# Patient Record
Sex: Female | Born: 1940
Health system: Southern US, Community
[De-identification: ages and names within clinical notes are randomized; demographics above are authoritative.]

## PROBLEM LIST (undated history)

## (undated) DIAGNOSIS — T8859XA Other complications of anesthesia, initial encounter: Secondary | ICD-10-CM

## (undated) DIAGNOSIS — L03115 Cellulitis of right lower limb: Secondary | ICD-10-CM

## (undated) DIAGNOSIS — T4145XA Adverse effect of unspecified anesthetic, initial encounter: Secondary | ICD-10-CM

## (undated) DIAGNOSIS — Z85820 Personal history of malignant melanoma of skin: Secondary | ICD-10-CM

## (undated) DIAGNOSIS — R0609 Other forms of dyspnea: Secondary | ICD-10-CM

## (undated) DIAGNOSIS — K219 Gastro-esophageal reflux disease without esophagitis: Secondary | ICD-10-CM

## (undated) DIAGNOSIS — Z8679 Personal history of other diseases of the circulatory system: Secondary | ICD-10-CM

## (undated) DIAGNOSIS — G4733 Obstructive sleep apnea (adult) (pediatric): Secondary | ICD-10-CM

## (undated) DIAGNOSIS — D071 Carcinoma in situ of vulva: Secondary | ICD-10-CM

## (undated) DIAGNOSIS — Z973 Presence of spectacles and contact lenses: Secondary | ICD-10-CM

## (undated) DIAGNOSIS — Z859 Personal history of malignant neoplasm, unspecified: Secondary | ICD-10-CM

## (undated) DIAGNOSIS — I509 Heart failure, unspecified: Secondary | ICD-10-CM

## (undated) DIAGNOSIS — I10 Essential (primary) hypertension: Secondary | ICD-10-CM

## (undated) DIAGNOSIS — I2510 Atherosclerotic heart disease of native coronary artery without angina pectoris: Secondary | ICD-10-CM

## (undated) DIAGNOSIS — F419 Anxiety disorder, unspecified: Secondary | ICD-10-CM

## (undated) DIAGNOSIS — M1990 Unspecified osteoarthritis, unspecified site: Secondary | ICD-10-CM

## (undated) DIAGNOSIS — R0600 Dyspnea, unspecified: Secondary | ICD-10-CM

## (undated) DIAGNOSIS — J45909 Unspecified asthma, uncomplicated: Secondary | ICD-10-CM

## (undated) DIAGNOSIS — J449 Chronic obstructive pulmonary disease, unspecified: Secondary | ICD-10-CM

## (undated) DIAGNOSIS — Z9889 Other specified postprocedural states: Secondary | ICD-10-CM

## (undated) DIAGNOSIS — D75839 Thrombocytosis, unspecified: Secondary | ICD-10-CM

## (undated) DIAGNOSIS — R109 Unspecified abdominal pain: Secondary | ICD-10-CM

## (undated) DIAGNOSIS — D473 Essential (hemorrhagic) thrombocythemia: Secondary | ICD-10-CM

## (undated) DIAGNOSIS — R600 Localized edema: Secondary | ICD-10-CM

## (undated) DIAGNOSIS — Z972 Presence of dental prosthetic device (complete) (partial): Secondary | ICD-10-CM

## (undated) HISTORY — PX: ABDOMINAL HYSTERECTOMY: SHX81

## (undated) HISTORY — DX: Gastro-esophageal reflux disease without esophagitis: K21.9

## (undated) HISTORY — DX: Atherosclerotic heart disease of native coronary artery without angina pectoris: I25.10

## (undated) HISTORY — PX: CARDIAC CATHETERIZATION: SHX172

## (undated) HISTORY — DX: Chronic obstructive pulmonary disease, unspecified: J44.9

## (undated) HISTORY — DX: Unspecified osteoarthritis, unspecified site: M19.90

## (undated) HISTORY — DX: Anxiety disorder, unspecified: F41.9

## (undated) HISTORY — DX: Unspecified asthma, uncomplicated: J45.909

## (undated) HISTORY — DX: Unspecified abdominal pain: R10.9

## (undated) HISTORY — DX: Heart failure, unspecified: I50.9

## (undated) HISTORY — DX: Cellulitis of right lower limb: L03.115

## (undated) HISTORY — DX: Essential (primary) hypertension: I10

---

## 2001-04-24 ENCOUNTER — Other Ambulatory Visit: Admission: RE | Admit: 2001-04-24 | Discharge: 2001-04-24 | Payer: Self-pay | Admitting: Obstetrics and Gynecology

## 2002-08-19 HISTORY — PX: KNEE ARTHROSCOPY: SUR90

## 2002-08-19 HISTORY — PX: REPAIR PERONEAL TENDONS ANKLE: SUR1201

## 2003-01-25 ENCOUNTER — Encounter: Admission: RE | Admit: 2003-01-25 | Discharge: 2003-01-25 | Payer: Self-pay | Admitting: Unknown Physician Specialty

## 2003-01-25 ENCOUNTER — Encounter: Payer: Self-pay | Admitting: Unknown Physician Specialty

## 2004-06-20 ENCOUNTER — Ambulatory Visit: Payer: Self-pay | Admitting: Oncology

## 2004-09-12 ENCOUNTER — Ambulatory Visit: Payer: Self-pay | Admitting: Oncology

## 2004-12-05 ENCOUNTER — Ambulatory Visit: Payer: Self-pay | Admitting: Oncology

## 2005-03-13 ENCOUNTER — Ambulatory Visit: Payer: Self-pay | Admitting: Oncology

## 2005-06-05 ENCOUNTER — Ambulatory Visit: Payer: Self-pay | Admitting: Oncology

## 2005-09-04 ENCOUNTER — Ambulatory Visit: Payer: Self-pay | Admitting: Oncology

## 2005-11-28 ENCOUNTER — Ambulatory Visit: Payer: Self-pay | Admitting: Oncology

## 2006-02-20 ENCOUNTER — Ambulatory Visit: Payer: Self-pay | Admitting: Oncology

## 2006-05-15 ENCOUNTER — Ambulatory Visit: Payer: Self-pay | Admitting: Oncology

## 2006-08-07 ENCOUNTER — Ambulatory Visit: Payer: Self-pay | Admitting: Oncology

## 2006-08-19 HISTORY — PX: SUBDURAL HEMATOMA EVACUATION VIA CRANIOTOMY: SUR319

## 2006-12-04 ENCOUNTER — Ambulatory Visit: Payer: Self-pay | Admitting: Oncology

## 2007-03-02 ENCOUNTER — Ambulatory Visit: Payer: Self-pay | Admitting: Oncology

## 2007-04-27 ENCOUNTER — Ambulatory Visit: Payer: Self-pay | Admitting: Oncology

## 2013-08-19 HISTORY — PX: MELANOMA EXCISION: SHX5266

## 2014-06-21 DIAGNOSIS — J309 Allergic rhinitis, unspecified: Secondary | ICD-10-CM

## 2014-06-21 DIAGNOSIS — K219 Gastro-esophageal reflux disease without esophagitis: Secondary | ICD-10-CM | POA: Insufficient documentation

## 2014-06-21 DIAGNOSIS — J449 Chronic obstructive pulmonary disease, unspecified: Secondary | ICD-10-CM | POA: Insufficient documentation

## 2014-06-21 DIAGNOSIS — I10 Essential (primary) hypertension: Secondary | ICD-10-CM

## 2014-06-21 HISTORY — DX: Essential (primary) hypertension: I10

## 2014-06-21 HISTORY — DX: Allergic rhinitis, unspecified: J30.9

## 2014-06-21 HISTORY — DX: Gastro-esophageal reflux disease without esophagitis: K21.9

## 2014-06-22 DIAGNOSIS — D473 Essential (hemorrhagic) thrombocythemia: Secondary | ICD-10-CM | POA: Insufficient documentation

## 2014-06-22 HISTORY — DX: Essential (hemorrhagic) thrombocythemia: D47.3

## 2014-06-29 DIAGNOSIS — D039 Melanoma in situ, unspecified: Secondary | ICD-10-CM

## 2014-06-29 HISTORY — DX: Melanoma in situ, unspecified: D03.9

## 2015-09-05 DIAGNOSIS — L570 Actinic keratosis: Secondary | ICD-10-CM | POA: Diagnosis not present

## 2015-09-05 DIAGNOSIS — C44729 Squamous cell carcinoma of skin of left lower limb, including hip: Secondary | ICD-10-CM | POA: Diagnosis not present

## 2015-09-21 DIAGNOSIS — E782 Mixed hyperlipidemia: Secondary | ICD-10-CM | POA: Diagnosis not present

## 2015-09-21 DIAGNOSIS — Z139 Encounter for screening, unspecified: Secondary | ICD-10-CM | POA: Diagnosis not present

## 2015-09-21 DIAGNOSIS — R600 Localized edema: Secondary | ICD-10-CM | POA: Diagnosis not present

## 2015-09-21 DIAGNOSIS — K219 Gastro-esophageal reflux disease without esophagitis: Secondary | ICD-10-CM | POA: Diagnosis not present

## 2015-09-21 DIAGNOSIS — N9089 Other specified noninflammatory disorders of vulva and perineum: Secondary | ICD-10-CM | POA: Diagnosis not present

## 2015-09-21 DIAGNOSIS — M5440 Lumbago with sciatica, unspecified side: Secondary | ICD-10-CM | POA: Diagnosis not present

## 2015-09-21 DIAGNOSIS — J449 Chronic obstructive pulmonary disease, unspecified: Secondary | ICD-10-CM | POA: Diagnosis not present

## 2015-09-21 DIAGNOSIS — N343 Urethral syndrome, unspecified: Secondary | ICD-10-CM | POA: Diagnosis not present

## 2015-09-21 DIAGNOSIS — Z6830 Body mass index (BMI) 30.0-30.9, adult: Secondary | ICD-10-CM | POA: Diagnosis not present

## 2015-09-21 DIAGNOSIS — Z1389 Encounter for screening for other disorder: Secondary | ICD-10-CM | POA: Diagnosis not present

## 2015-09-21 DIAGNOSIS — I10 Essential (primary) hypertension: Secondary | ICD-10-CM | POA: Diagnosis not present

## 2015-09-21 DIAGNOSIS — E669 Obesity, unspecified: Secondary | ICD-10-CM | POA: Diagnosis not present

## 2015-09-25 DIAGNOSIS — D071 Carcinoma in situ of vulva: Secondary | ICD-10-CM | POA: Diagnosis not present

## 2015-09-25 DIAGNOSIS — N9089 Other specified noninflammatory disorders of vulva and perineum: Secondary | ICD-10-CM | POA: Diagnosis not present

## 2015-10-09 ENCOUNTER — Encounter: Payer: Self-pay | Admitting: Gynecologic Oncology

## 2015-10-09 ENCOUNTER — Ambulatory Visit: Payer: PPO | Attending: Gynecologic Oncology | Admitting: Gynecologic Oncology

## 2015-10-09 VITALS — BP 145/81 | HR 88 | Temp 98.3°F | Resp 18 | Ht 65.0 in | Wt 178.1 lb

## 2015-10-09 DIAGNOSIS — F419 Anxiety disorder, unspecified: Secondary | ICD-10-CM | POA: Insufficient documentation

## 2015-10-09 DIAGNOSIS — D071 Carcinoma in situ of vulva: Secondary | ICD-10-CM | POA: Insufficient documentation

## 2015-10-09 DIAGNOSIS — M1990 Unspecified osteoarthritis, unspecified site: Secondary | ICD-10-CM | POA: Insufficient documentation

## 2015-10-09 DIAGNOSIS — I10 Essential (primary) hypertension: Secondary | ICD-10-CM | POA: Diagnosis not present

## 2015-10-09 DIAGNOSIS — D759 Disease of blood and blood-forming organs, unspecified: Secondary | ICD-10-CM | POA: Insufficient documentation

## 2015-10-09 DIAGNOSIS — Z7982 Long term (current) use of aspirin: Secondary | ICD-10-CM | POA: Insufficient documentation

## 2015-10-09 DIAGNOSIS — J449 Chronic obstructive pulmonary disease, unspecified: Secondary | ICD-10-CM | POA: Insufficient documentation

## 2015-10-09 DIAGNOSIS — Z85820 Personal history of malignant melanoma of skin: Secondary | ICD-10-CM | POA: Insufficient documentation

## 2015-10-09 DIAGNOSIS — D473 Essential (hemorrhagic) thrombocythemia: Secondary | ICD-10-CM | POA: Insufficient documentation

## 2015-10-09 NOTE — Progress Notes (Signed)
Consult Note: Gyn-Onc  Consult was requested by Dr. Elgie Congo for the evaluation of Yolanda Flowers 75 y.o. female  CC:  Chief Complaint  Patient presents with  . VIN III    New Consultation    Assessment/Plan:  Yolanda Flowers  is a 75 y.o.  year old with VIN 3 of the right posterior labia minora/introitus.  I discussed with the patient that recommended treatment is wide local excision of the vulva. We will send the specimen for pathology. It is possible that invasive carcinoma is identified on final pathology which may alter the diagnosis and necessitate further surgery.  She has thrombocytosis and takes hydroxyurea. I recommend that she has assessment of her CBC within 48 hours of surgery. If she is thrombocytopenic or neutropenic, we will need to communicate with Dr Hinton Rao. It would be ideal for her to stop her hydroxyurea before surgery, however, if this is not safe, we will proceed with caution understanding that there is an increased risk for bleeding complications. She will stop her aspirin as of today.  She has a history of adverse events around anesthesia ("difficulty waking up"). These were at [facility]. It is unclear if they are related to the underlying reason for surgery (brain bleed) or a specific adverse reaction to the anesthesia.  For this procedure I feel it would be appropriate to perform under MAC with local anesthetic.   HPI: Yolanda Flowers is a very pleasant post-menopausal 75 year old woman who is seen in consultation at the request of Dr Elgie Congo for [facility]. The patient has a 4 months history of vulvar pruritis. She saw Dr Elgie Congo on February 8th 2017 and a 4cm lesion of the right vulva. It was biopsied in 2 places and found to be VIN3.  The patient complex past medical history significant for thrombocytosis, subdural hematoma requiring operation, COPD, and hypertension. She's had multiple resections of invasive and preinvasive squamous cell lesions from her facial skin,  and resection of left great toe melanoma.  She reports having difficulty with anesthesia. In 2004 she underwent a left knee arthroscopy and reports that she had trouble waking output, and thought that her heart was going to stop. In 2008 she was treated at [facility] for a subdural hematoma which occurred after she fell and hit her head while on hydroxyurea. She states that she could not wake up postoperatively (although obviously did at some point). Upon deeper questioning is very unclear if this was a primary issue with anesthesia or with the underlying cerebral insult. Following that surgical decompression she required being taken back to the operating room in the weeks following for an additional decompression procedure. In 2015 she underwent resection of the distal phalanx of her left great toe for melanoma. This was performed at [facility] which was also the site for her surgery for her cranial decompression. She states that the anesthesiologist use something different at that surgery though she is unclear what it was in if it was a local or regional block. She did not have issues with anesthesia that time.  There are patient reports that she is usually not taken off hydroxyurea at the time of her surgery but usually stops aspirin preceding it.  Current Meds:  Outpatient Encounter Prescriptions as of 10/09/2015  Medication Sig  . amLODipine (NORVASC) 5 MG tablet Take 2.5 mg by mouth.  Marland Kitchen aspirin EC 81 MG tablet Take 81 mg by mouth.  . Biotin 5000 MCG TABS Take 5,000 tablets by  mouth every morning.  . calcium citrate-vitamin D (CITRACAL+D) 315-200 MG-UNIT tablet Take by mouth.  . fluticasone (FLONASE) 50 MCG/ACT nasal spray Place into the nose.  . furosemide (LASIX) 20 MG tablet TAKE 1 TABLET BY MOUTH 1 TO 2 TIMES A DAY AS NEEDED FOR SWELLING IN LEGS  . hydroxyurea (HYDREA) 500 MG capsule Take by mouth.  . lansoprazole (PREVACID) 30 MG capsule Take 30 mg by mouth.  Marland Kitchen lisinopril  (PRINIVIL,ZESTRIL) 20 MG tablet Take 20 mg by mouth.  . montelukast (SINGULAIR) 10 MG tablet Take 10 mg by mouth.  . Nutritional Supplements (OSTEO ADVANCE) TABS Take 2 tablets by mouth daily.  . potassium chloride (MICRO-K) 10 MEQ CR capsule TAKE 1 (ONE) CAPSULE BY MOUTH -TAKE ALONG WITH FUROSEMIDE 1 TO 2 TIMES/DAY  . UNABLE TO FIND Take 1,000 mg by mouth.  . Vitamin D, Ergocalciferol, (DRISDOL) 50000 units CAPS capsule Take by mouth.   No facility-administered encounter medications on file as of 10/09/2015.    Allergy: Not on File  Social Hx:   Social History   Social History  . Marital Status: Married    Spouse Name: N/A  . Number of Children: N/A  . Years of Education: N/A   Occupational History  . Not on file.   Social History Main Topics  . Smoking status: Never Smoker   . Smokeless tobacco: Not on file  . Alcohol Use: No  . Drug Use: No  . Sexual Activity: Not on file   Other Topics Concern  . Not on file   Social History Narrative  . No narrative on file    Past Surgical Hx:  Past Surgical History  Procedure Laterality Date  . Abdominal hysterectomy      age 85  . Brain surgery  2008    Bleeding on the brain , X 2    Past Medical Hx:  Past Medical History  Diagnosis Date  . Skin cancer   . Allergy   . Anxiety   . Arthritis   . Clotting disorder ([place])   . COPD (chronic obstructive pulmonary disease) ([employer])   . GERD (gastroesophageal reflux disease)   . Hypertension     Past Gynecological History:  Hysterectomy for fibroids  No LMP recorded.  Family Hx:  Family History  Problem Relation Age of Onset  . Clotting disorder Mother   . Stroke Father     Review of Systems:  Constitutional  Feels well,    ENT Normal appearing ears and nares bilaterally Skin/Breast  No rash, sores, jaundice, itching, dryness Cardiovascular  No chest pain, shortness of breath, or edema  Pulmonary  No cough or wheeze.  Gastro Intestinal  No nausea,  vomitting, or diarrhoea. No bright red blood per rectum, no abdominal pain, change in bowel movement, or constipation.  Genito Urinary  No frequency, urgency, dysuria, + vulvar pruritis Musculo Skeletal  No myalgia, arthralgia, joint swelling or pain  Neurologic  No weakness, numbness, change in gait,  Psychology  No depression, anxiety, insomnia.   Vitals:  Blood pressure 145/81, pulse 88, temperature 98.3 F (36.8 C), temperature source Oral, resp. rate 18, height 5\' 5"  (1.651 m), weight 178 lb 1.6 oz (80.786 kg), SpO2 100 %.  Physical Exam: WD in NAD Neck  Supple NROM, without any enlargements.  Lymph Node Survey No cervical supraclavicular or inguinal adenopathy Cardiovascular  Pulse normal rate, regularity and rhythm. S1 and S2 normal.  Lungs  Clear to auscultation bilateraly, without wheezes/crackles/rhonchi. Good air movement.  Skin  No rash/lesions/breakdown  Psychiatry  Alert and oriented to person, place, and time  Abdomen  Normoactive bowel sounds, abdomen soft, non-tender and overweight without evidence of hernia.  Back No CVA tenderness Genito Urinary  Vulva/vagina: Normal external female genitalia. There is a 3.5 cm area of slightly raised, irregular, leukoplakia on the right vaginal introitus and right posterior labia minora with a punch biopsy site identified. It is not fixed to the underlying tissues. It does not appear grossly consistent with invasive squamous cell carcinoma of the vulva. There is no accompanying lymphadenopathy. Rectal  deferred Extremities  No bilateral cyanosis, clubbing or edema.   Donaciano Eva, MD  10/09/2015, 10:04 AM  CC: Dr Little Ishikawa, Dr Lynelle Smoke, Dr Jimmye Norman

## 2015-10-09 NOTE — Patient Instructions (Addendum)
Plan to have a wide local excision of the vulva on March 7 at the [facility].  You will receive a phone call from the pre-surgical RN to discuss instructions.   STOP TAKING ASPIRIN NOW.  We will also be reaching out to Dr. Hinton Rao.  We will need your labs checked within 48 hours of surgery to check your blood counts.  Please call for any questions or concerns.

## 2015-10-11 DIAGNOSIS — C4372 Malignant melanoma of left lower limb, including hip: Secondary | ICD-10-CM | POA: Diagnosis not present

## 2015-10-11 DIAGNOSIS — D649 Anemia, unspecified: Secondary | ICD-10-CM | POA: Diagnosis not present

## 2015-10-11 DIAGNOSIS — D6481 Anemia due to antineoplastic chemotherapy: Secondary | ICD-10-CM | POA: Diagnosis not present

## 2015-10-11 DIAGNOSIS — D071 Carcinoma in situ of vulva: Secondary | ICD-10-CM | POA: Diagnosis not present

## 2015-10-11 DIAGNOSIS — Z85820 Personal history of malignant melanoma of skin: Secondary | ICD-10-CM | POA: Diagnosis not present

## 2015-10-11 DIAGNOSIS — D473 Essential (hemorrhagic) thrombocythemia: Secondary | ICD-10-CM | POA: Diagnosis not present

## 2015-10-18 ENCOUNTER — Encounter (HOSPITAL_BASED_OUTPATIENT_CLINIC_OR_DEPARTMENT_OTHER): Payer: Self-pay | Admitting: *Deleted

## 2015-10-18 NOTE — Progress Notes (Signed)
NPO AFTER MN.  ARRIVE AT 0830.  NEEDS ISTAT AND EKG.  WILL TAKE NORVASC, PREVACID, AND SINGULAIR  AM DOS W/ SIPS OF WATER.  PT STATES WILL HAVE CBC DONE AT [facility] Monday 10-23-2015.

## 2015-10-23 ENCOUNTER — Encounter: Payer: Self-pay | Admitting: Gynecologic Oncology

## 2015-10-23 DIAGNOSIS — D473 Essential (hemorrhagic) thrombocythemia: Secondary | ICD-10-CM | POA: Diagnosis not present

## 2015-10-23 NOTE — Progress Notes (Signed)
Labs from Dr. Remi Deter office reviewed: WBC 6.3, RBC 2.76, Hgb 11.4, Hct 34.7, PLT 436.  Pt cleared to proceed with surgery per Dr. Denman George.  Called the patient and informed her.  No concerns voiced.  Advised to call for any needs.

## 2015-10-24 ENCOUNTER — Ambulatory Visit (HOSPITAL_BASED_OUTPATIENT_CLINIC_OR_DEPARTMENT_OTHER): Payer: PPO | Admitting: Anesthesiology

## 2015-10-24 ENCOUNTER — Other Ambulatory Visit: Payer: Self-pay

## 2015-10-24 ENCOUNTER — Encounter (HOSPITAL_BASED_OUTPATIENT_CLINIC_OR_DEPARTMENT_OTHER)
Admission: RE | Disposition: A | Discharge status: Home / Self Care | Payer: Self-pay | Source: Ambulatory Visit | Attending: Gynecologic Oncology

## 2015-10-24 ENCOUNTER — Encounter (HOSPITAL_BASED_OUTPATIENT_CLINIC_OR_DEPARTMENT_OTHER): Payer: Self-pay | Admitting: Certified Registered"

## 2015-10-24 ENCOUNTER — Ambulatory Visit (HOSPITAL_BASED_OUTPATIENT_CLINIC_OR_DEPARTMENT_OTHER)
Admission: RE | Admit: 2015-10-24 | Discharge: 2015-10-24 | Disposition: A | Discharge status: Home / Self Care | Payer: PPO | Source: Ambulatory Visit | Attending: Gynecologic Oncology | Admitting: Gynecologic Oncology

## 2015-10-24 DIAGNOSIS — Z79899 Other long term (current) drug therapy: Secondary | ICD-10-CM | POA: Diagnosis not present

## 2015-10-24 DIAGNOSIS — G4730 Sleep apnea, unspecified: Secondary | ICD-10-CM | POA: Insufficient documentation

## 2015-10-24 DIAGNOSIS — Z7951 Long term (current) use of inhaled steroids: Secondary | ICD-10-CM | POA: Insufficient documentation

## 2015-10-24 DIAGNOSIS — I10 Essential (primary) hypertension: Secondary | ICD-10-CM | POA: Insufficient documentation

## 2015-10-24 DIAGNOSIS — D071 Carcinoma in situ of vulva: Secondary | ICD-10-CM | POA: Diagnosis present

## 2015-10-24 DIAGNOSIS — D473 Essential (hemorrhagic) thrombocythemia: Secondary | ICD-10-CM | POA: Diagnosis not present

## 2015-10-24 DIAGNOSIS — Z85820 Personal history of malignant melanoma of skin: Secondary | ICD-10-CM | POA: Insufficient documentation

## 2015-10-24 DIAGNOSIS — Z7982 Long term (current) use of aspirin: Secondary | ICD-10-CM | POA: Insufficient documentation

## 2015-10-24 DIAGNOSIS — J449 Chronic obstructive pulmonary disease, unspecified: Secondary | ICD-10-CM | POA: Insufficient documentation

## 2015-10-24 DIAGNOSIS — K219 Gastro-esophageal reflux disease without esophagitis: Secondary | ICD-10-CM | POA: Insufficient documentation

## 2015-10-24 HISTORY — DX: Dyspnea, unspecified: R06.00

## 2015-10-24 HISTORY — DX: Presence of dental prosthetic device (complete) (partial): Z97.2

## 2015-10-24 HISTORY — DX: Obstructive sleep apnea (adult) (pediatric): G47.33

## 2015-10-24 HISTORY — DX: Carcinoma in situ of vulva: D07.1

## 2015-10-24 HISTORY — DX: Personal history of other diseases of the circulatory system: Z86.79

## 2015-10-24 HISTORY — DX: Other forms of dyspnea: R06.09

## 2015-10-24 HISTORY — DX: Presence of spectacles and contact lenses: Z97.3

## 2015-10-24 HISTORY — DX: Thrombocytosis, unspecified: D75.839

## 2015-10-24 HISTORY — DX: Adverse effect of unspecified anesthetic, initial encounter: T41.45XA

## 2015-10-24 HISTORY — DX: Essential (hemorrhagic) thrombocythemia: D47.3

## 2015-10-24 HISTORY — DX: Localized edema: R60.0

## 2015-10-24 HISTORY — PX: VULVECTOMY: SHX1086

## 2015-10-24 HISTORY — DX: Other complications of anesthesia, initial encounter: T88.59XA

## 2015-10-24 HISTORY — DX: Other specified postprocedural states: Z98.890

## 2015-10-24 HISTORY — DX: Personal history of malignant melanoma of skin: Z85.820

## 2015-10-24 HISTORY — DX: Other specified postprocedural states: Z85.9

## 2015-10-24 LAB — POCT I-STAT 4, (NA,K, GLUC, HGB,HCT)
GLUCOSE: 88 mg/dL (ref 65–99)
HEMATOCRIT: 39 % (ref 36.0–46.0)
Hemoglobin: 13.3 g/dL (ref 12.0–15.0)
Potassium: 4.5 mmol/L (ref 3.5–5.1)
Sodium: 142 mmol/L (ref 135–145)

## 2015-10-24 SURGERY — WIDE EXCISION VULVECTOMY
Anesthesia: Monitor Anesthesia Care

## 2015-10-24 MED ORDER — MEPERIDINE HCL 25 MG/ML IJ SOLN
6.2500 mg | INTRAMUSCULAR | Status: DC | PRN
  Filled 2015-10-24: qty 1

## 2015-10-24 MED ORDER — LIDOCAINE HCL (CARDIAC) 20 MG/ML IV SOLN
INTRAVENOUS | Status: DC | PRN
  Administered 2015-10-24: 50 mg via INTRAVENOUS

## 2015-10-24 MED ORDER — OXYCODONE HCL 5 MG PO TABS
5.0000 mg | ORAL_TABLET | Freq: Once | ORAL | Status: AC | PRN
  Administered 2015-10-24: 5 mg via ORAL
  Filled 2015-10-24: qty 1

## 2015-10-24 MED ORDER — PROPOFOL 500 MG/50ML IV EMUL
INTRAVENOUS | Status: DC | PRN
  Administered 2015-10-24: 50 ug/kg/min via INTRAVENOUS

## 2015-10-24 MED ORDER — ACETIC ACID 5 % SOLN
Status: DC | PRN
  Administered 2015-10-24: 1 via TOPICAL

## 2015-10-24 MED ORDER — OXYCODONE HCL 5 MG PO TABS
5.0000 mg | ORAL_TABLET | ORAL | Status: DC | PRN
  Filled 2015-10-24: qty 2

## 2015-10-24 MED ORDER — HYDROMORPHONE HCL 1 MG/ML IJ SOLN
0.2500 mg | INTRAMUSCULAR | Status: DC | PRN
  Filled 2015-10-24: qty 1

## 2015-10-24 MED ORDER — LIDOCAINE HCL 1 % IJ SOLN
INTRAMUSCULAR | Status: DC | PRN
  Administered 2015-10-24 (×2): 10 mL

## 2015-10-24 MED ORDER — OXYCODONE-ACETAMINOPHEN 5-325 MG PO TABS: 1.0000 | ORAL_TABLET | ORAL | Status: DC | PRN

## 2015-10-24 MED ORDER — ACETAMINOPHEN 500 MG PO TABS
1000.0000 mg | ORAL_TABLET | Freq: Four times a day (QID) | ORAL | Status: DC
  Filled 2015-10-24: qty 2

## 2015-10-24 MED ORDER — OXYCODONE HCL 5 MG PO TABS
ORAL_TABLET | ORAL | Status: AC
  Filled 2015-10-24: qty 1

## 2015-10-24 MED ORDER — SODIUM CHLORIDE 0.9% FLUSH
3.0000 mL | Freq: Two times a day (BID) | INTRAVENOUS | Status: DC
  Filled 2015-10-24: qty 3

## 2015-10-24 MED ORDER — SODIUM CHLORIDE 0.9% FLUSH
3.0000 mL | INTRAVENOUS | Status: DC | PRN
  Filled 2015-10-24: qty 3

## 2015-10-24 MED ORDER — ACETAMINOPHEN 650 MG RE SUPP
650.0000 mg | RECTAL | Status: DC | PRN
  Filled 2015-10-24: qty 1

## 2015-10-24 MED ORDER — ACETAMINOPHEN 325 MG PO TABS
650.0000 mg | ORAL_TABLET | ORAL | Status: DC | PRN
  Filled 2015-10-24: qty 2

## 2015-10-24 MED ORDER — PROPOFOL 10 MG/ML IV BOLUS
INTRAVENOUS | Status: DC | PRN
  Administered 2015-10-24 (×2): 40 mg via INTRAVENOUS

## 2015-10-24 MED ORDER — PROMETHAZINE HCL 25 MG/ML IJ SOLN
6.2500 mg | INTRAMUSCULAR | Status: DC | PRN
  Filled 2015-10-24: qty 1

## 2015-10-24 MED ORDER — OXYCODONE HCL 5 MG/5ML PO SOLN
5.0000 mg | Freq: Once | ORAL | Status: AC | PRN
  Filled 2015-10-24: qty 5

## 2015-10-24 MED ORDER — FENTANYL CITRATE (PF) 100 MCG/2ML IJ SOLN
INTRAMUSCULAR | Status: DC | PRN
  Administered 2015-10-24: 50 ug via INTRAVENOUS
  Administered 2015-10-24 (×2): 25 ug via INTRAVENOUS

## 2015-10-24 MED ORDER — SODIUM CHLORIDE 0.9 % IV SOLN
250.0000 mL | INTRAVENOUS | Status: DC | PRN
  Filled 2015-10-24: qty 250

## 2015-10-24 MED ORDER — LACTATED RINGERS IV SOLN
INTRAVENOUS | Status: DC
  Administered 2015-10-24: 08:00:00 via INTRAVENOUS
  Filled 2015-10-24: qty 1000

## 2015-10-24 MED ORDER — FENTANYL CITRATE (PF) 100 MCG/2ML IJ SOLN
INTRAMUSCULAR | Status: AC
  Filled 2015-10-24: qty 2

## 2015-10-24 MED ORDER — 0.9 % SODIUM CHLORIDE (POUR BTL) OPTIME
TOPICAL | Status: DC | PRN
  Administered 2015-10-24: 500 mL

## 2015-10-24 SURGICAL SUPPLY — 44 items
APPLICATOR COTTON TIP 6IN STRL (MISCELLANEOUS) IMPLANT
BLADE CLIPPER SURG (BLADE) ×1 IMPLANT
BLADE SURG 15 STRL LF DISP TIS (BLADE) ×1 IMPLANT
BLADE SURG 15 STRL SS (BLADE) ×2
BNDG GAUZE ELAST 4 BULKY (GAUZE/BANDAGES/DRESSINGS) IMPLANT
BRIEF STRETCH FOR OB PAD LRG (UNDERPADS AND DIAPERS) ×2 IMPLANT
CANISTER SUCTION 2500CC (MISCELLANEOUS) ×2 IMPLANT
CATH FOLEY 2WAY SLVR  5CC 14FR (CATHETERS)
CATH FOLEY 2WAY SLVR 5CC 14FR (CATHETERS) IMPLANT
CATH ROBINSON RED A/P 14FR (CATHETERS) ×1 IMPLANT
COVER BACK TABLE 60X90IN (DRAPES) ×2 IMPLANT
DRAPE LG THREE QUARTER DISP (DRAPES) ×2 IMPLANT
DRAPE UNDERBUTTOCKS STRL (DRAPE) ×2 IMPLANT
GAUZE SPONGE 4X4 12PLY STRL (GAUZE/BANDAGES/DRESSINGS) IMPLANT
GAUZE SPONGE 4X4 16PLY XRAY LF (GAUZE/BANDAGES/DRESSINGS) IMPLANT
GLOVE BIO SURGEON STRL SZ 6 (GLOVE) ×4 IMPLANT
KIT ROOM TURNOVER WOR (KITS) ×2 IMPLANT
LEGGING LITHOTOMY PAIR STRL (DRAPES) ×2 IMPLANT
NEEDLE HYPO 25X1 1.5 SAFETY (NEEDLE) ×2 IMPLANT
NS IRRIG 500ML POUR BTL (IV SOLUTION) ×2 IMPLANT
PACK BASIN DAY SURGERY FS (CUSTOM PROCEDURE TRAY) ×2 IMPLANT
PAD OB MATERNITY 4.3X12.25 (PERSONAL CARE ITEMS) ×2 IMPLANT
PENCIL BUTTON HOLSTER BLD 10FT (ELECTRODE) ×2 IMPLANT
SCOPETTES 8  STERILE (MISCELLANEOUS)
SCOPETTES 8 STERILE (MISCELLANEOUS) IMPLANT
SPONGE LAP 4X18 X RAY DECT (DISPOSABLE) ×1 IMPLANT
SUT VIC AB 0 SH 27 (SUTURE) ×2 IMPLANT
SUT VIC AB 2-0 CT2 27 (SUTURE) IMPLANT
SUT VIC AB 2-0 SH 27 (SUTURE) ×4
SUT VIC AB 2-0 SH 27X BRD (SUTURE) ×2 IMPLANT
SUT VIC AB 3-0 PS2 18 (SUTURE)
SUT VIC AB 3-0 PS2 18XBRD (SUTURE) IMPLANT
SUT VIC AB 3-0 SH 27 (SUTURE) ×6
SUT VIC AB 3-0 SH 27X BRD (SUTURE) ×3 IMPLANT
SUT VICRYL 2 0 18  UND BR (SUTURE)
SUT VICRYL 2 0 18 UND BR (SUTURE) IMPLANT
SUT VICRYL 4-0 PS2 18IN ABS (SUTURE) ×5 IMPLANT
SYR BULB IRRIGATION 50ML (SYRINGE) ×2 IMPLANT
SYR CONTROL 10ML LL (SYRINGE) ×2 IMPLANT
TOWEL OR 17X24 6PK STRL BLUE (TOWEL DISPOSABLE) ×2 IMPLANT
TRAY DSU PREP LF (CUSTOM PROCEDURE TRAY) ×3 IMPLANT
TUBE CONNECTING 12X1/4 (SUCTIONS) ×2 IMPLANT
UNDERPAD 30X30 INCONTINENT (UNDERPADS AND DIAPERS) ×2 IMPLANT
YANKAUER SUCT BULB TIP NO VENT (SUCTIONS) ×2 IMPLANT

## 2015-10-24 NOTE — Op Note (Signed)
PATIENT: Yolanda Flowers DATE: 10/24/15   Preop Diagnosis: VIN3  Postoperative Diagnosis: same  Surgery: Partial simple partial simple right vulvectomy  Surgeons:  Donaciano Eva, MD Assistant: none  Anesthesia: General   Estimated blood loss: 44ml  IVF:  363ml   Urine output: 123XX123 ml   Complications: None   Pathology: right posterior labia minora and perineal body with marking stitch at 12 o'clock  Operative findings: 3cm area of leukoplakia on right posterior labia minora/perineal body and encroaching upon the vaginal introitus.  Procedure: The patient was identified in the preoperative holding area. Informed consent was signed on the chart. Patient was seen history was reviewed and exam was performed.   The patient was then taken to the operating room and placed in the supine position with SCD hose on. General anesthesia was then induced without difficulty. She was then placed in the dorsolithotomy position. The perineum was prepped with Betadine. The vagina was prepped with Betadine. The patient was then draped after the prep was dried. A Foley catheter was inserted into the bladder under sterile conditions.  Timeout was performed the patient, procedure, antibiotic, allergy, and length of procedure. 5% acetic acid solution was applied to the perineum. The vulvar tissues were inspected for areas of acetowhite changes or leukoplakia. The lesion was identified and the marking pen was used to circumscribe the area with appropriate surgical margins. The subcuticular tissues were infiltrated with 1% lidocaine. The 15 blade scalpel was used to make an incision through the skin circumferentially as marked. The skin elipse was grasped and was separated from the underlying deep dermal tissues with the bovie device. After the specimen had been completely resected, it was oriented and marked at 12 o'clock with a 0-vicryl suture. The bovie was used to obtain hemostasis at the surgical  bed. The subcutaneous tissues were irrigated and made hemostatic.   The deep dermal layer was approximated with 3-0vicryl mattress sutures to bring the skin edges into approximation and off tension. The wound was closed following langher's lines. The cutaneous layer was closed with interrupted 4-0 vicryl stitches and mattress sutures to ensure a tension free and hemostatic closure. The perineum was again irrigated. The foley was removed.  All instrument, suture, laparotomy, Ray-Tec, and needle counts were correct x2. The patient tolerated the procedure well and was taken recovery room in stable condition. This is Everitt Amber dictating an operative note on [name].  Donaciano Eva, MD

## 2015-10-24 NOTE — Anesthesia Preprocedure Evaluation (Signed)
Anesthesia Evaluation  Patient identified by MRN, date of birth, ID band Patient awake    Reviewed: Allergy & Precautions, NPO status , Patient's Chart, lab work & pertinent test results  History of Anesthesia Complications (+) history of anesthetic complications  Airway Mallampati: II  TM Distance: >3 FB Neck ROM: Full    Dental no notable dental hx.    Pulmonary shortness of breath and with exertion, sleep apnea , COPD,    Pulmonary exam normal breath sounds clear to auscultation       Cardiovascular hypertension, Pt. on medications Normal cardiovascular exam Rhythm:Regular Rate:Normal     Neuro/Psych PSYCHIATRIC DISORDERS Anxiety negative neurological ROS     GI/Hepatic Neg liver ROS, GERD  ,  Endo/Other  negative endocrine ROS  Renal/GU negative Renal ROS     Musculoskeletal  (+) Arthritis ,   Abdominal   Peds  Hematology negative hematology ROS (+)   Anesthesia Other Findings   Reproductive/Obstetrics negative OB ROS                             Anesthesia Physical Anesthesia Plan  ASA: III  Anesthesia Plan: MAC   Post-op Pain Management:    Induction: Intravenous  Airway Management Planned: Simple Face Mask  Additional Equipment:   Intra-op Plan:   Post-operative Plan:   Informed Consent: I have reviewed the patients History and Physical, chart, labs and discussed the procedure including the risks, benefits and alternatives for the proposed anesthesia with the patient or authorized representative who has indicated his/her understanding and acceptance.   Dental advisory given  Plan Discussed with: CRNA  Anesthesia Plan Comments:         Anesthesia Quick Evaluation

## 2015-10-24 NOTE — Interval H&P Note (Signed)
History and Physical Interval Note:  10/24/2015 9:50 AM  Yolanda Flowers  has presented today for surgery, with the diagnosis of VIN III   The various methods of treatment have been discussed with the patient and family. After consideration of risks, benefits and other options for treatment, the patient has consented to  Procedure(s): WIDE LOCAL EXCISION OF THE VULVA  (N/A) as a surgical intervention .  The patient's history has been reviewed, patient examined, no change in status, stable for surgery.  I have reviewed the patient's chart and labs.  Questions were answered to the patient's satisfaction.     Donaciano Eva

## 2015-10-24 NOTE — Anesthesia Postprocedure Evaluation (Signed)
Anesthesia Post Note  Patient: Yolanda Flowers  Procedure(s) Performed: Procedure(s) (LRB): WIDE LOCAL EXCISION OF THE VULVA  (N/A)  Patient location during evaluation: PACU Anesthesia Type: MAC Level of consciousness: awake and alert Pain management: pain level controlled Vital Signs Assessment: post-procedure vital signs reviewed and stable Respiratory status: spontaneous breathing, nonlabored ventilation, respiratory function stable and patient connected to nasal cannula oxygen Cardiovascular status: stable and blood pressure returned to baseline Anesthetic complications: no    Last Vitals:  Filed Vitals:   10/24/15 1100 10/24/15 1115  BP: 154/83 161/96  Pulse: 81 81  Temp:    Resp: 17 20    Last Pain:  Filed Vitals:   10/24/15 1120  PainSc: 7                  Briana Farner [facility]

## 2015-10-24 NOTE — H&P (View-Only) (Signed)
Consult Note: Gyn-Onc  Consult was requested by Dr. Elgie Congo for the evaluation of Yolanda Flowers 75 y.o. female  CC:  Chief Complaint  Patient presents with  . VIN III    New Consultation    Assessment/Plan:  Ms. Yolanda Flowers  is a 75 y.o.  year old with VIN 3 of the right posterior labia minora/introitus.  I discussed with the patient that recommended treatment is wide local excision of the vulva. We will send the specimen for pathology. It is possible that invasive carcinoma is identified on final pathology which may alter the diagnosis and necessitate further surgery.  She has thrombocytosis and takes hydroxyurea. I recommend that she has assessment of her CBC within 48 hours of surgery. If she is thrombocytopenic or neutropenic, we will need to communicate with Dr Hinton Rao. It would be ideal for her to stop her hydroxyurea before surgery, however, if this is not safe, we will proceed with caution understanding that there is an increased risk for bleeding complications. She will stop her aspirin as of today.  She has a history of adverse events around anesthesia ("difficulty waking up"). These were at [facility]. It is unclear if they are related to the underlying reason for surgery (brain bleed) or a specific adverse reaction to the anesthesia.  For this procedure I feel it would be appropriate to perform under MAC with local anesthetic.   HPI: Yolanda Flowers is a very pleasant post-menopausal 75 year old woman who is seen in consultation at the request of Dr Elgie Congo for [facility]. The patient has a 4 months history of vulvar pruritis. She saw Dr Elgie Congo on February 8th 2017 and a 4cm lesion of the right vulva. It was biopsied in 2 places and found to be VIN3.  The patient complex past medical history significant for thrombocytosis, subdural hematoma requiring operation, COPD, and hypertension. She's had multiple resections of invasive and preinvasive squamous cell lesions from her facial skin,  and resection of left great toe melanoma.  She reports having difficulty with anesthesia. In 2004 she underwent a left knee arthroscopy and reports that she had trouble waking output, and thought that her heart was going to stop. In 2008 she was treated at [facility] for a subdural hematoma which occurred after she fell and hit her head while on hydroxyurea. She states that she could not wake up postoperatively (although obviously did at some point). Upon deeper questioning is very unclear if this was a primary issue with anesthesia or with the underlying cerebral insult. Following that surgical decompression she required being taken back to the operating room in the weeks following for an additional decompression procedure. In 2015 she underwent resection of the distal phalanx of her left great toe for melanoma. This was performed at [facility] which was also the site for her surgery for her cranial decompression. She states that the anesthesiologist use something different at that surgery though she is unclear what it was in if it was a local or regional block. She did not have issues with anesthesia that time.  There are patient reports that she is usually not taken off hydroxyurea at the time of her surgery but usually stops aspirin preceding it.  Current Meds:  Outpatient Encounter Prescriptions as of 10/09/2015  Medication Sig  . amLODipine (NORVASC) 5 MG tablet Take 2.5 mg by mouth.  Marland Kitchen aspirin EC 81 MG tablet Take 81 mg by mouth.  . Biotin 5000 MCG TABS Take 5,000 tablets by  mouth every morning.  . calcium citrate-vitamin D (CITRACAL+D) 315-200 MG-UNIT tablet Take by mouth.  . fluticasone (FLONASE) 50 MCG/ACT nasal spray Place into the nose.  . furosemide (LASIX) 20 MG tablet TAKE 1 TABLET BY MOUTH 1 TO 2 TIMES A DAY AS NEEDED FOR SWELLING IN LEGS  . hydroxyurea (HYDREA) 500 MG capsule Take by mouth.  . lansoprazole (PREVACID) 30 MG capsule Take 30 mg by mouth.  Marland Kitchen lisinopril  (PRINIVIL,ZESTRIL) 20 MG tablet Take 20 mg by mouth.  . montelukast (SINGULAIR) 10 MG tablet Take 10 mg by mouth.  . Nutritional Supplements (OSTEO ADVANCE) TABS Take 2 tablets by mouth daily.  . potassium chloride (MICRO-K) 10 MEQ CR capsule TAKE 1 (ONE) CAPSULE BY MOUTH -TAKE ALONG WITH FUROSEMIDE 1 TO 2 TIMES/DAY  . UNABLE TO FIND Take 1,000 mg by mouth.  . Vitamin D, Ergocalciferol, (DRISDOL) 50000 units CAPS capsule Take by mouth.   No facility-administered encounter medications on file as of 10/09/2015.    Allergy: Not on File  Social Hx:   Social History   Social History  . Marital Status: Married    Spouse Name: N/A  . Number of Children: N/A  . Years of Education: N/A   Occupational History  . Not on file.   Social History Main Topics  . Smoking status: Never Smoker   . Smokeless tobacco: Not on file  . Alcohol Use: No  . Drug Use: No  . Sexual Activity: Not on file   Other Topics Concern  . Not on file   Social History Narrative  . No narrative on file    Past Surgical Hx:  Past Surgical History  Procedure Laterality Date  . Abdominal hysterectomy      age 39  . Brain surgery  2008    Bleeding on the brain , X 2    Past Medical Hx:  Past Medical History  Diagnosis Date  . Skin cancer   . Allergy   . Anxiety   . Arthritis   . Clotting disorder ([place])   . COPD (chronic obstructive pulmonary disease) ([place])   . GERD (gastroesophageal reflux disease)   . Hypertension     Past Gynecological History:  Hysterectomy for fibroids  No LMP recorded.  Family Hx:  Family History  Problem Relation Age of Onset  . Clotting disorder Mother   . Stroke Father     Review of Systems:  Constitutional  Feels well,    ENT Normal appearing ears and nares bilaterally Skin/Breast  No rash, sores, jaundice, itching, dryness Cardiovascular  No chest pain, shortness of breath, or edema  Pulmonary  No cough or wheeze.  Gastro Intestinal  No nausea,  vomitting, or diarrhoea. No bright red blood per rectum, no abdominal pain, change in bowel movement, or constipation.  Genito Urinary  No frequency, urgency, dysuria, + vulvar pruritis Musculo Skeletal  No myalgia, arthralgia, joint swelling or pain  Neurologic  No weakness, numbness, change in gait,  Psychology  No depression, anxiety, insomnia.   Vitals:  Blood pressure 145/81, pulse 88, temperature 98.3 F (36.8 C), temperature source Oral, resp. rate 18, height 5\' 5"  (1.651 m), weight 178 lb 1.6 oz (80.786 kg), SpO2 100 %.  Physical Exam: WD in NAD Neck  Supple NROM, without any enlargements.  Lymph Node Survey No cervical supraclavicular or inguinal adenopathy Cardiovascular  Pulse normal rate, regularity and rhythm. S1 and S2 normal.  Lungs  Clear to auscultation bilateraly, without wheezes/crackles/rhonchi. Good air movement.  Skin  No rash/lesions/breakdown  Psychiatry  Alert and oriented to person, place, and time  Abdomen  Normoactive bowel sounds, abdomen soft, non-tender and overweight without evidence of hernia.  Back No CVA tenderness Genito Urinary  Vulva/vagina: Normal external female genitalia. There is a 3.5 cm area of slightly raised, irregular, leukoplakia on the right vaginal introitus and right posterior labia minora with a punch biopsy site identified. It is not fixed to the underlying tissues. It does not appear grossly consistent with invasive squamous cell carcinoma of the vulva. There is no accompanying lymphadenopathy. Rectal  deferred Extremities  No bilateral cyanosis, clubbing or edema.   Donaciano Eva, MD  10/09/2015, 10:04 AM  CC: Dr Little Ishikawa, Dr Lynelle Smoke, Dr Jimmye Norman

## 2015-10-24 NOTE — Transfer of Care (Signed)
Immediate Anesthesia Transfer of Care Note  Patient: Yolanda Flowers  Procedure(s) Performed: Procedure(s): WIDE LOCAL EXCISION OF THE VULVA  (N/A)  Patient Location: PACU  Anesthesia Type:MAC  Level of Consciousness: awake, alert  and oriented  Airway & Oxygen Therapy: Patient Spontanous Breathing  Post-op Assessment: Report given to RN  Post vital signs: Reviewed and stable  Last Vitals: 133/74, 77,20, 98% Filed Vitals:   10/24/15 0745  BP: 148/82  Pulse: 85  Temp: 37 C  Resp: 16    Complications: No apparent anesthesia complications

## 2015-10-24 NOTE — Discharge Instructions (Signed)
Vulvectomy, Care After °The vulva is the external female genitalia, outside and around the vagina and pubic bone. It consists of: °· The skin on, and in front of, the pubic bone. °· The clitoris. °· The labia majora (large lips) on the outside of the vagina. °· The labia minora (small lips) around the opening of the vagina. °· The opening and the skin in and around the vagina. °A vulvectomy is the removal of the tissue of the vulva, which sometimes includes removal of the lymph nodes and tissue in the groin areas. °These discharge instructions provide you with general information on caring for yourself after you leave the hospital. It is also important that you know the warning signs of complications, so that you can seek treatment. Please read the instructions outlined below and refer to this sheet in the next few weeks. Your caregiver may also give you specific information and medicines. If you have any questions or complications after discharge, please call your caregiver. °ACTIVITY °· Rest as much as possible the first two weeks after discharge. °· Arrange to have help from family or others with your daily activities when you go home. °· Avoid heavy lifting (more than 5 pounds), pushing, or pulling. °· If you feel tired, balance your activity with rest periods. °· Follow your caregiver's instruction about climbing stairs and driving a car. °· Increase activity gradually. °· Do not exercise until you have permission from your caregiver. °LEG AND FOOT CARE °If your doctor has removed lymph nodes from your groin area, there may be an increase in swelling of your legs and feet. You can help prevent swelling by doing the following: °· Elevate your legs while sitting or lying down. °· If your caregiver has ordered special stockings, wear them according to instructions. °· Avoid standing in one place for long periods of time. °· Call the physical therapy department if you have any questions about swelling or treatment  for swelling. °· Avoid salt in your diet. It can cause fluid retention and swelling. °· Do not cross your legs, especially when sitting. °NUTRITION °· You may resume your normal diet. °· Drink 6 to 8 glasses of fluids a day. °· Eat a healthy, balanced diet including portions of food from the meat (protein), milk, fruit, vegetable, and bread groups. °· Your caregiver may recommend you take a multivitamin with iron. °ELIMINATION °· You may notice that your stream of urine is at a different angle, and may tend to spray. Using a plastic funnel may help to decrease urine spray. °· If constipation occurs, drink more liquids, and add more fruits, vegetables, and bran to your diet. You may take a mild laxative, such as Milk of Magnesia, Metamucil, or a stool softener such as Colace, with permission from your caregiver. °HYGIENE °· You may shower and wash your hair. °· Check with your caregiver about tub baths. °· Do not add any bath oils or chemicals to your bath water, after you have permission to take baths. °· While passing urine, pour water from a bottle or spray over your vulva to dilute the urine as it passes the incision (this will decrease burning and discomfort). °· Clean yourself well after moving your bowels. °· After urinating, do not wipe. Dap or pat dry with toilet paper or a dry cleath soft cloth. °· A sitz bath will help keep your perineal area clean, reduce swelling, and provide comfort. °· Avoid wearing underpants for the first 2 weeks and wear loose skirts to   allow circulation of air around the incision °· You do not need to apply dressings, salves or lotions to the wound. °· The stitches are self-dissolving and will absorb and disappear over a couple of months (it is normal to notice the knot from the stitches on toilet paper after voiding). °HOME CARE INSTRUCTIONS  °· Apply a soft ice pack (or frozen bag of peas) to your perineum (vulva) every hour in the first 48 hours after surgery. This will reduce  swelling. °· Avoid activities that involve a lot of friction between your legs. °· Avoid wearing pants or underpants in the 1st 2 weeks (skirts are preferable). °· Take your temperature twice a day and record it, especially if you feel feverish or have chills. °· Follow your caregiver's instructions about medicines, activity, and follow-up appointments after surgery. °· Do not drink alcohol while taking pain medicine. °· Change your dressing as advised by your caregiver. °· You may take over-the-counter medicine for pain, recommended by your caregiver. °· If your pain is not relieved with medicine, call your caregiver. °· Do not take aspirin because it can cause bleeding. °· Do not douche or use tampons (use a nonperfumed sanitary pad). °· Do not have sexual intercourse until your caregiver gives you permission (typically 6 weeks postoperatively). Hugging, kissing, and playful sexual activity is fine with your caregiver's permission. °· Warm sitz baths, with your caregiver's permission, are helpful to control swelling and discomfort. °· Take showers instead of baths, until your caregiver gives you permission to take baths. °· You may take a mild medicine for constipation, recommended by your caregiver. Bran foods and drinking a lot of fluids will help with constipation. °· Make sure your family understands everything about your operation and recovery. °SEEK MEDICAL CARE IF:  °· You notice swelling and redness around the wound area. °· You notice a foul smell coming from the wound or on the surgical dressing. °· You notice the wound is separating. °· You have painful or bloody urination. °· You develop nausea and vomiting. °· You develop diarrhea. °· You develop a rash. °· You have a reaction or allergy from the medicine. °· You feel dizzy or light-headed. °· You need stronger pain medicine. °SEEK IMMEDIATE MEDICAL CARE IF:  °· You develop a temperature of 102° F (38.9° C) or higher. °· You pass out. °· You develop  leg or chest pain. °· You develop abdominal pain. °· You develop shortness of breath. °· You develop bleeding from the wound area. °· You see pus in the wound area. °MAKE SURE YOU:  °· Understand these instructions. °· Will watch your condition. °· Will get help right away if you are not doing well or get worse. °Document Released: 03/19/2004 Document Revised: 12/20/2013 Document Reviewed: 07/07/2009 °ExitCare® Patient Information ©2015 [employer]. This information is not intended to replace advice given to you by your health care provider. Make sure you discuss any questions you have with your health care provider. °Post Anesthesia Home Care Instructions ° °Activity: °Get plenty of rest for the remainder of the day. A responsible adult should stay with you for 24 hours following the procedure.  °For the next 24 hours, DO NOT: °-Drive a car °-Operate machinery °-Drink alcoholic beverages °-Take any medication unless instructed by your physician °-Make any legal decisions or sign important papers. ° °Meals: °Start with liquid foods such as gelatin or soup. Progress to regular foods as tolerated. Avoid greasy, spicy, heavy foods. If nausea and/or vomiting occur, drink   only clear liquids until the nausea and/or vomiting subsides. Call your physician if vomiting continues. ° °Special Instructions/Symptoms: °Your throat may feel dry or sore from the anesthesia or the breathing tube placed in your throat during surgery. If this causes discomfort, gargle with warm salt water. The discomfort should disappear within 24 hours. ° °If you had a scopolamine patch placed behind your ear for the management of post- operative nausea and/or vomiting: ° °1. The medication in the patch is effective for 72 hours, after which it should be removed.  Wrap patch in a tissue and discard in the trash. Wash hands thoroughly with soap and water. °2. You may remove the patch earlier than 72 hours if you experience unpleasant side effects  which may include dry mouth, dizziness or visual disturbances. °3. Avoid touching the patch. Wash your hands with soap and water after contact with the patch. °  ° °

## 2015-10-24 NOTE — Anesthesia Procedure Notes (Signed)
Procedure Name: MAC Date/Time: 10/24/2015 10:00 AM Performed by: Bethena Roys T Oxygen Delivery Method: Nasal cannula Placement Confirmation: positive ETCO2 and CO2 detector

## 2015-10-25 ENCOUNTER — Encounter (HOSPITAL_BASED_OUTPATIENT_CLINIC_OR_DEPARTMENT_OTHER): Payer: Self-pay | Admitting: Gynecologic Oncology

## 2015-10-30 ENCOUNTER — Telehealth: Payer: Self-pay | Admitting: Gynecologic Oncology

## 2015-10-30 NOTE — Telephone Encounter (Signed)
Patient had returned message earlier and stating she was "doing ok" but still having light vulvar bleeding.  Just reached out to patient again and left message asking her to call the office.

## 2015-10-30 NOTE — Telephone Encounter (Signed)
Left message asking the patient to please call the office.  Attempted to call patient to check on post-op status and to discuss path results.

## 2015-11-06 DIAGNOSIS — C44622 Squamous cell carcinoma of skin of right upper limb, including shoulder: Secondary | ICD-10-CM | POA: Diagnosis not present

## 2015-11-06 DIAGNOSIS — D0461 Carcinoma in situ of skin of right upper limb, including shoulder: Secondary | ICD-10-CM | POA: Diagnosis not present

## 2015-11-07 DIAGNOSIS — D473 Essential (hemorrhagic) thrombocythemia: Secondary | ICD-10-CM

## 2015-11-07 DIAGNOSIS — D071 Carcinoma in situ of vulva: Secondary | ICD-10-CM

## 2015-11-07 DIAGNOSIS — D539 Nutritional anemia, unspecified: Secondary | ICD-10-CM

## 2015-11-07 DIAGNOSIS — C4372 Malignant melanoma of left lower limb, including hip: Secondary | ICD-10-CM

## 2015-11-08 ENCOUNTER — Telehealth: Payer: Self-pay | Admitting: Gynecologic Oncology

## 2015-11-08 NOTE — Telephone Encounter (Signed)
Patient returned call to the office.  Doing well post-operatively.  Informed of final path results.  Follow up appt arranged.  Reportable signs and symptoms reviewed.  Advised to call for any questions or concerns.

## 2015-11-27 ENCOUNTER — Encounter: Payer: Self-pay | Admitting: Gynecologic Oncology

## 2015-11-27 ENCOUNTER — Ambulatory Visit: Payer: PPO | Attending: Gynecologic Oncology | Admitting: Gynecologic Oncology

## 2015-11-27 VITALS — BP 163/92 | HR 92 | Temp 98.3°F | Resp 18 | Ht 65.0 in | Wt 171.4 lb

## 2015-11-27 DIAGNOSIS — D071 Carcinoma in situ of vulva: Secondary | ICD-10-CM

## 2015-11-27 NOTE — Progress Notes (Signed)
POST OPERATIVE EVALUATION  Assessment:    75 y.o. year old with VIN3.   S/p partial simple right vulvectomy on 10/24/15.   Plan: 1) Pathology reports reviewed today 2) Treatment counseling - I discussed the preinvasive nature of the lesion. I discussed that she is at 25% risk for recurrence in th surrounding vulvar skin. We discussed symptoms to watch for with respect for recurrence. She requires 6 monthly surveillance of the vulva for 3 years with Dr Elgie Congo. She was given the opportunity to ask questions, which were answered to her satisfaction, and she is agreement with the above mentioned plan of care.  3)  Return to clinic to see Dr Elgie Congo in 6 months. She can see me on a prn basis.  HPI:  Yolanda Flowers is a 75 y.o. year old No obstetric history on file. initially seen in consultation on 10/09/15 at the request of Dr Elgie Congo for [facility].  She then underwent a partial simple vulvectomy (right posterior) on 99991111 without complications.  Her postoperative course was uncomplicated.  Her final pathology revealed VIN3 with negative margins.  She is seen today for a postoperative check and to discuss her pathology results and ongoing plan.  Since discharge from the hospital, she is feeling well.  She has improving appetite, normal bowel and bladder function, and pain controlled with minimal PO medication. She has no other complaints today.    Review of systems: Constitutional:  She has no weight gain or weight loss. She has no fever or chills. Eyes: No blurred vision Ears, Nose, Mouth, Throat: No dizziness, headaches or changes in hearing. No mouth sores. Cardiovascular: No chest pain, palpitations or edema. Respiratory:  No shortness of breath, wheezing or cough Gastrointestinal: She has normal bowel movements without diarrhea or constipation. She denies any nausea or vomiting. She denies blood in her stool or heart burn. Genitourinary:  She denies pelvic pain, pelvic pressure or changes in her urinary  function. She has no hematuria, dysuria, or incontinence. She has no irregular vaginal bleeding or vaginal discharge Musculoskeletal: Denies muscle weakness or joint pains.  Skin:  She has no skin changes, rashes or itching Neurological:  Denies dizziness or headaches. No neuropathy, no numbness or tingling. Psychiatric:  She denies depression or anxiety. Hematologic/Lymphatic:   No easy bruising or bleeding   Physical Exam: Blood pressure 163/92, pulse 92, temperature 98.3 F (36.8 C), temperature source Oral, resp. rate 18, height 5\' 5"  (1.651 m), weight 171 lb 6.4 oz (77.747 kg), SpO2 100 %. General: Well dressed, well nourished in no apparent distress.   HEENT:  Normocephalic and atraumatic, no lesions.  Extraocular muscles intact. Sclerae anicteric. Pupils equal, round, reactive. No mouth sores or ulcers. Thyroid is normal size, not nodular, midline. Genitourinary: Vulvar incision in tact, suture material still present. No signs of cellulitis. Healing well Extremities: No cyanosis, clubbing or edema.  No calf tenderness or erythema. No palpable cords. Psychiatric: Mood and affect are appropriate. Neurological: Awake, alert and oriented x 3. Sensation is intact, no neuropathy.  Musculoskeletal: No pain, normal strength and range of motion.    Donaciano Eva, MD

## 2015-11-27 NOTE — Patient Instructions (Signed)
Plan to follow up with Dr. Elgie Congo for a 6 month check up.  Follow up with Dr. Denman George as needed.

## 2015-12-18 DIAGNOSIS — A849 Tick-borne viral encephalitis, unspecified: Secondary | ICD-10-CM | POA: Diagnosis not present

## 2015-12-18 DIAGNOSIS — A932 Colorado tick fever: Secondary | ICD-10-CM | POA: Diagnosis not present

## 2015-12-26 DIAGNOSIS — L570 Actinic keratosis: Secondary | ICD-10-CM | POA: Diagnosis not present

## 2016-01-03 DIAGNOSIS — D473 Essential (hemorrhagic) thrombocythemia: Secondary | ICD-10-CM | POA: Diagnosis not present

## 2016-01-03 DIAGNOSIS — D071 Carcinoma in situ of vulva: Secondary | ICD-10-CM | POA: Diagnosis not present

## 2016-02-05 DIAGNOSIS — K5909 Other constipation: Secondary | ICD-10-CM | POA: Diagnosis not present

## 2016-02-05 DIAGNOSIS — C4490 Unspecified malignant neoplasm of skin, unspecified: Secondary | ICD-10-CM | POA: Diagnosis not present

## 2016-02-05 DIAGNOSIS — Z6828 Body mass index (BMI) 28.0-28.9, adult: Secondary | ICD-10-CM | POA: Diagnosis not present

## 2016-02-05 DIAGNOSIS — E782 Mixed hyperlipidemia: Secondary | ICD-10-CM | POA: Diagnosis not present

## 2016-02-05 DIAGNOSIS — I10 Essential (primary) hypertension: Secondary | ICD-10-CM | POA: Diagnosis not present

## 2016-02-05 DIAGNOSIS — E559 Vitamin D deficiency, unspecified: Secondary | ICD-10-CM | POA: Diagnosis not present

## 2016-02-05 DIAGNOSIS — E669 Obesity, unspecified: Secondary | ICD-10-CM | POA: Diagnosis not present

## 2016-02-05 DIAGNOSIS — J449 Chronic obstructive pulmonary disease, unspecified: Secondary | ICD-10-CM | POA: Diagnosis not present

## 2016-02-05 DIAGNOSIS — Z79899 Other long term (current) drug therapy: Secondary | ICD-10-CM | POA: Diagnosis not present

## 2016-02-06 DIAGNOSIS — L570 Actinic keratosis: Secondary | ICD-10-CM | POA: Diagnosis not present

## 2016-03-19 DIAGNOSIS — C44629 Squamous cell carcinoma of skin of left upper limb, including shoulder: Secondary | ICD-10-CM | POA: Diagnosis not present

## 2016-03-19 DIAGNOSIS — L570 Actinic keratosis: Secondary | ICD-10-CM | POA: Diagnosis not present

## 2016-03-19 DIAGNOSIS — D0439 Carcinoma in situ of skin of other parts of face: Secondary | ICD-10-CM | POA: Diagnosis not present

## 2016-03-27 DIAGNOSIS — D473 Essential (hemorrhagic) thrombocythemia: Secondary | ICD-10-CM | POA: Diagnosis not present

## 2016-05-20 DIAGNOSIS — L570 Actinic keratosis: Secondary | ICD-10-CM | POA: Diagnosis not present

## 2016-05-20 DIAGNOSIS — C4441 Basal cell carcinoma of skin of scalp and neck: Secondary | ICD-10-CM | POA: Diagnosis not present

## 2016-06-06 DIAGNOSIS — Z9181 History of falling: Secondary | ICD-10-CM | POA: Diagnosis not present

## 2016-06-06 DIAGNOSIS — C4442 Squamous cell carcinoma of skin of scalp and neck: Secondary | ICD-10-CM | POA: Diagnosis not present

## 2016-06-06 DIAGNOSIS — I10 Essential (primary) hypertension: Secondary | ICD-10-CM | POA: Diagnosis not present

## 2016-06-06 DIAGNOSIS — Z79899 Other long term (current) drug therapy: Secondary | ICD-10-CM | POA: Diagnosis not present

## 2016-06-06 DIAGNOSIS — E559 Vitamin D deficiency, unspecified: Secondary | ICD-10-CM | POA: Diagnosis not present

## 2016-06-06 DIAGNOSIS — Z23 Encounter for immunization: Secondary | ICD-10-CM | POA: Diagnosis not present

## 2016-06-06 DIAGNOSIS — Z6828 Body mass index (BMI) 28.0-28.9, adult: Secondary | ICD-10-CM | POA: Diagnosis not present

## 2016-06-06 DIAGNOSIS — K219 Gastro-esophageal reflux disease without esophagitis: Secondary | ICD-10-CM | POA: Diagnosis not present

## 2016-06-06 DIAGNOSIS — Z780 Asymptomatic menopausal state: Secondary | ICD-10-CM | POA: Diagnosis not present

## 2016-06-06 DIAGNOSIS — E782 Mixed hyperlipidemia: Secondary | ICD-10-CM | POA: Diagnosis not present

## 2016-06-06 DIAGNOSIS — J309 Allergic rhinitis, unspecified: Secondary | ICD-10-CM | POA: Diagnosis not present

## 2016-06-06 DIAGNOSIS — E663 Overweight: Secondary | ICD-10-CM | POA: Diagnosis not present

## 2016-06-19 DIAGNOSIS — C4441 Basal cell carcinoma of skin of scalp and neck: Secondary | ICD-10-CM | POA: Diagnosis not present

## 2016-06-26 DIAGNOSIS — D473 Essential (hemorrhagic) thrombocythemia: Secondary | ICD-10-CM | POA: Diagnosis not present

## 2016-07-02 DIAGNOSIS — D0439 Carcinoma in situ of skin of other parts of face: Secondary | ICD-10-CM | POA: Diagnosis not present

## 2016-07-02 DIAGNOSIS — L570 Actinic keratosis: Secondary | ICD-10-CM | POA: Diagnosis not present

## 2016-07-14 DIAGNOSIS — R51 Headache: Secondary | ICD-10-CM | POA: Diagnosis not present

## 2016-07-14 DIAGNOSIS — T86828 Other complications of skin graft (allograft) (autograft): Secondary | ICD-10-CM | POA: Diagnosis not present

## 2016-07-14 DIAGNOSIS — I97618 Postprocedural hemorrhage and hematoma of a circulatory system organ or structure following other circulatory system procedure: Secondary | ICD-10-CM | POA: Diagnosis not present

## 2016-08-07 DIAGNOSIS — C4372 Malignant melanoma of left lower limb, including hip: Secondary | ICD-10-CM | POA: Diagnosis not present

## 2016-08-15 DIAGNOSIS — H2513 Age-related nuclear cataract, bilateral: Secondary | ICD-10-CM | POA: Diagnosis not present

## 2016-08-15 DIAGNOSIS — H524 Presbyopia: Secondary | ICD-10-CM | POA: Diagnosis not present

## 2016-08-15 DIAGNOSIS — H3540 Unspecified peripheral retinal degeneration: Secondary | ICD-10-CM | POA: Diagnosis not present

## 2016-08-15 DIAGNOSIS — C44622 Squamous cell carcinoma of skin of right upper limb, including shoulder: Secondary | ICD-10-CM | POA: Diagnosis not present

## 2016-08-16 DIAGNOSIS — J209 Acute bronchitis, unspecified: Secondary | ICD-10-CM | POA: Diagnosis not present

## 2016-08-29 DIAGNOSIS — C44622 Squamous cell carcinoma of skin of right upper limb, including shoulder: Secondary | ICD-10-CM | POA: Diagnosis not present

## 2016-09-03 DIAGNOSIS — C4401 Basal cell carcinoma of skin of lip: Secondary | ICD-10-CM | POA: Diagnosis not present

## 2016-09-03 DIAGNOSIS — L570 Actinic keratosis: Secondary | ICD-10-CM | POA: Diagnosis not present

## 2016-09-12 DIAGNOSIS — Z6828 Body mass index (BMI) 28.0-28.9, adult: Secondary | ICD-10-CM | POA: Diagnosis not present

## 2016-09-12 DIAGNOSIS — I10 Essential (primary) hypertension: Secondary | ICD-10-CM | POA: Diagnosis not present

## 2016-09-12 DIAGNOSIS — E782 Mixed hyperlipidemia: Secondary | ICD-10-CM | POA: Diagnosis not present

## 2016-09-12 DIAGNOSIS — J309 Allergic rhinitis, unspecified: Secondary | ICD-10-CM | POA: Diagnosis not present

## 2016-09-12 DIAGNOSIS — C4442 Squamous cell carcinoma of skin of scalp and neck: Secondary | ICD-10-CM | POA: Diagnosis not present

## 2016-09-12 DIAGNOSIS — Z9181 History of falling: Secondary | ICD-10-CM | POA: Diagnosis not present

## 2016-09-12 DIAGNOSIS — E559 Vitamin D deficiency, unspecified: Secondary | ICD-10-CM | POA: Diagnosis not present

## 2016-09-12 DIAGNOSIS — J449 Chronic obstructive pulmonary disease, unspecified: Secondary | ICD-10-CM | POA: Diagnosis not present

## 2016-09-12 DIAGNOSIS — M545 Low back pain: Secondary | ICD-10-CM | POA: Diagnosis not present

## 2016-09-18 DIAGNOSIS — D473 Essential (hemorrhagic) thrombocythemia: Secondary | ICD-10-CM | POA: Diagnosis not present

## 2016-10-01 DIAGNOSIS — Z1389 Encounter for screening for other disorder: Secondary | ICD-10-CM | POA: Diagnosis not present

## 2016-10-01 DIAGNOSIS — I10 Essential (primary) hypertension: Secondary | ICD-10-CM | POA: Diagnosis not present

## 2016-10-01 DIAGNOSIS — M5440 Lumbago with sciatica, unspecified side: Secondary | ICD-10-CM | POA: Diagnosis not present

## 2016-10-01 DIAGNOSIS — Z6828 Body mass index (BMI) 28.0-28.9, adult: Secondary | ICD-10-CM | POA: Diagnosis not present

## 2016-10-01 DIAGNOSIS — E669 Obesity, unspecified: Secondary | ICD-10-CM | POA: Diagnosis not present

## 2016-10-02 DIAGNOSIS — Z0000 Encounter for general adult medical examination without abnormal findings: Secondary | ICD-10-CM | POA: Diagnosis not present

## 2016-10-02 DIAGNOSIS — Z1389 Encounter for screening for other disorder: Secondary | ICD-10-CM | POA: Diagnosis not present

## 2016-10-02 DIAGNOSIS — N959 Unspecified menopausal and perimenopausal disorder: Secondary | ICD-10-CM | POA: Diagnosis not present

## 2016-10-02 DIAGNOSIS — Z9181 History of falling: Secondary | ICD-10-CM | POA: Diagnosis not present

## 2016-10-02 DIAGNOSIS — Z136 Encounter for screening for cardiovascular disorders: Secondary | ICD-10-CM | POA: Diagnosis not present

## 2016-10-04 DIAGNOSIS — Z1231 Encounter for screening mammogram for malignant neoplasm of breast: Secondary | ICD-10-CM | POA: Diagnosis not present

## 2016-10-25 DIAGNOSIS — N959 Unspecified menopausal and perimenopausal disorder: Secondary | ICD-10-CM | POA: Diagnosis not present

## 2016-10-25 DIAGNOSIS — M85851 Other specified disorders of bone density and structure, right thigh: Secondary | ICD-10-CM | POA: Diagnosis not present

## 2016-10-31 DIAGNOSIS — I10 Essential (primary) hypertension: Secondary | ICD-10-CM | POA: Diagnosis not present

## 2016-10-31 DIAGNOSIS — Z6828 Body mass index (BMI) 28.0-28.9, adult: Secondary | ICD-10-CM | POA: Diagnosis not present

## 2016-10-31 DIAGNOSIS — Z139 Encounter for screening, unspecified: Secondary | ICD-10-CM | POA: Diagnosis not present

## 2016-10-31 DIAGNOSIS — M5440 Lumbago with sciatica, unspecified side: Secondary | ICD-10-CM | POA: Diagnosis not present

## 2016-11-04 DIAGNOSIS — M5440 Lumbago with sciatica, unspecified side: Secondary | ICD-10-CM | POA: Diagnosis not present

## 2016-11-04 DIAGNOSIS — M48061 Spinal stenosis, lumbar region without neurogenic claudication: Secondary | ICD-10-CM | POA: Diagnosis not present

## 2016-11-14 DIAGNOSIS — C44229 Squamous cell carcinoma of skin of left ear and external auricular canal: Secondary | ICD-10-CM | POA: Diagnosis not present

## 2016-11-14 DIAGNOSIS — L570 Actinic keratosis: Secondary | ICD-10-CM | POA: Diagnosis not present

## 2016-11-14 DIAGNOSIS — C44319 Basal cell carcinoma of skin of other parts of face: Secondary | ICD-10-CM | POA: Diagnosis not present

## 2016-12-13 DIAGNOSIS — M8580 Other specified disorders of bone density and structure, unspecified site: Secondary | ICD-10-CM | POA: Diagnosis not present

## 2016-12-13 DIAGNOSIS — M5136 Other intervertebral disc degeneration, lumbar region: Secondary | ICD-10-CM | POA: Diagnosis not present

## 2016-12-13 DIAGNOSIS — D539 Nutritional anemia, unspecified: Secondary | ICD-10-CM | POA: Diagnosis not present

## 2016-12-13 DIAGNOSIS — G8929 Other chronic pain: Secondary | ICD-10-CM | POA: Diagnosis not present

## 2016-12-13 DIAGNOSIS — D473 Essential (hemorrhagic) thrombocythemia: Secondary | ICD-10-CM | POA: Diagnosis not present

## 2016-12-13 DIAGNOSIS — Z85820 Personal history of malignant melanoma of skin: Secondary | ICD-10-CM | POA: Diagnosis not present

## 2016-12-13 DIAGNOSIS — M48061 Spinal stenosis, lumbar region without neurogenic claudication: Secondary | ICD-10-CM | POA: Diagnosis not present

## 2016-12-13 DIAGNOSIS — D649 Anemia, unspecified: Secondary | ICD-10-CM | POA: Diagnosis not present

## 2016-12-13 DIAGNOSIS — Z86008 Personal history of in-situ neoplasm of other site: Secondary | ICD-10-CM | POA: Diagnosis not present

## 2017-02-13 DIAGNOSIS — L570 Actinic keratosis: Secondary | ICD-10-CM | POA: Diagnosis not present

## 2017-03-03 DIAGNOSIS — Z79899 Other long term (current) drug therapy: Secondary | ICD-10-CM | POA: Diagnosis not present

## 2017-03-03 DIAGNOSIS — E663 Overweight: Secondary | ICD-10-CM | POA: Diagnosis not present

## 2017-03-03 DIAGNOSIS — I10 Essential (primary) hypertension: Secondary | ICD-10-CM | POA: Diagnosis not present

## 2017-03-03 DIAGNOSIS — E559 Vitamin D deficiency, unspecified: Secondary | ICD-10-CM | POA: Diagnosis not present

## 2017-03-03 DIAGNOSIS — Z6827 Body mass index (BMI) 27.0-27.9, adult: Secondary | ICD-10-CM | POA: Diagnosis not present

## 2017-03-03 DIAGNOSIS — E782 Mixed hyperlipidemia: Secondary | ICD-10-CM | POA: Diagnosis not present

## 2017-03-03 DIAGNOSIS — K219 Gastro-esophageal reflux disease without esophagitis: Secondary | ICD-10-CM | POA: Diagnosis not present

## 2017-03-03 DIAGNOSIS — M5440 Lumbago with sciatica, unspecified side: Secondary | ICD-10-CM | POA: Diagnosis not present

## 2017-03-03 DIAGNOSIS — J449 Chronic obstructive pulmonary disease, unspecified: Secondary | ICD-10-CM | POA: Diagnosis not present

## 2017-03-21 DIAGNOSIS — M8580 Other specified disorders of bone density and structure, unspecified site: Secondary | ICD-10-CM | POA: Diagnosis not present

## 2017-03-21 DIAGNOSIS — D473 Essential (hemorrhagic) thrombocythemia: Secondary | ICD-10-CM | POA: Diagnosis not present

## 2017-03-21 DIAGNOSIS — D539 Nutritional anemia, unspecified: Secondary | ICD-10-CM | POA: Diagnosis not present

## 2017-03-21 DIAGNOSIS — Z85820 Personal history of malignant melanoma of skin: Secondary | ICD-10-CM | POA: Diagnosis not present

## 2017-03-21 DIAGNOSIS — Z8544 Personal history of malignant neoplasm of other female genital organs: Secondary | ICD-10-CM | POA: Diagnosis not present

## 2017-04-14 DIAGNOSIS — K219 Gastro-esophageal reflux disease without esophagitis: Secondary | ICD-10-CM | POA: Diagnosis not present

## 2017-04-14 DIAGNOSIS — K5900 Constipation, unspecified: Secondary | ICD-10-CM | POA: Diagnosis not present

## 2017-04-14 DIAGNOSIS — K222 Esophageal obstruction: Secondary | ICD-10-CM | POA: Diagnosis not present

## 2017-04-29 DIAGNOSIS — L570 Actinic keratosis: Secondary | ICD-10-CM | POA: Diagnosis not present

## 2017-04-29 DIAGNOSIS — D0439 Carcinoma in situ of skin of other parts of face: Secondary | ICD-10-CM | POA: Diagnosis not present

## 2017-05-01 DIAGNOSIS — Z79899 Other long term (current) drug therapy: Secondary | ICD-10-CM | POA: Diagnosis not present

## 2017-05-01 DIAGNOSIS — K449 Diaphragmatic hernia without obstruction or gangrene: Secondary | ICD-10-CM | POA: Diagnosis not present

## 2017-05-01 DIAGNOSIS — K222 Esophageal obstruction: Secondary | ICD-10-CM | POA: Diagnosis not present

## 2017-05-01 DIAGNOSIS — Z85828 Personal history of other malignant neoplasm of skin: Secondary | ICD-10-CM | POA: Diagnosis not present

## 2017-05-01 DIAGNOSIS — I10 Essential (primary) hypertension: Secondary | ICD-10-CM | POA: Diagnosis not present

## 2017-05-01 DIAGNOSIS — R1310 Dysphagia, unspecified: Secondary | ICD-10-CM | POA: Diagnosis not present

## 2017-05-12 DIAGNOSIS — D473 Essential (hemorrhagic) thrombocythemia: Secondary | ICD-10-CM | POA: Diagnosis not present

## 2017-05-30 DIAGNOSIS — Z23 Encounter for immunization: Secondary | ICD-10-CM | POA: Diagnosis not present

## 2017-06-02 DIAGNOSIS — K222 Esophageal obstruction: Secondary | ICD-10-CM | POA: Diagnosis not present

## 2017-06-02 DIAGNOSIS — K5730 Diverticulosis of large intestine without perforation or abscess without bleeding: Secondary | ICD-10-CM | POA: Diagnosis not present

## 2017-06-17 DIAGNOSIS — Z6827 Body mass index (BMI) 27.0-27.9, adult: Secondary | ICD-10-CM | POA: Diagnosis not present

## 2017-06-17 DIAGNOSIS — R300 Dysuria: Secondary | ICD-10-CM | POA: Diagnosis not present

## 2017-06-17 DIAGNOSIS — N390 Urinary tract infection, site not specified: Secondary | ICD-10-CM | POA: Diagnosis not present

## 2017-06-23 DIAGNOSIS — Z85820 Personal history of malignant melanoma of skin: Secondary | ICD-10-CM | POA: Diagnosis not present

## 2017-06-23 DIAGNOSIS — M8580 Other specified disorders of bone density and structure, unspecified site: Secondary | ICD-10-CM | POA: Diagnosis not present

## 2017-06-23 DIAGNOSIS — D473 Essential (hemorrhagic) thrombocythemia: Secondary | ICD-10-CM | POA: Diagnosis not present

## 2017-06-23 DIAGNOSIS — D6481 Anemia due to antineoplastic chemotherapy: Secondary | ICD-10-CM | POA: Diagnosis not present

## 2017-06-23 DIAGNOSIS — M8589 Other specified disorders of bone density and structure, multiple sites: Secondary | ICD-10-CM | POA: Diagnosis not present

## 2017-06-23 DIAGNOSIS — M5136 Other intervertebral disc degeneration, lumbar region: Secondary | ICD-10-CM | POA: Diagnosis not present

## 2017-06-23 DIAGNOSIS — Z86008 Personal history of in-situ neoplasm of other site: Secondary | ICD-10-CM | POA: Diagnosis not present

## 2017-06-23 DIAGNOSIS — T451X5A Adverse effect of antineoplastic and immunosuppressive drugs, initial encounter: Secondary | ICD-10-CM | POA: Diagnosis not present

## 2017-06-23 DIAGNOSIS — D539 Nutritional anemia, unspecified: Secondary | ICD-10-CM | POA: Diagnosis not present

## 2017-07-16 DIAGNOSIS — M5440 Lumbago with sciatica, unspecified side: Secondary | ICD-10-CM | POA: Diagnosis not present

## 2017-07-16 DIAGNOSIS — Z6828 Body mass index (BMI) 28.0-28.9, adult: Secondary | ICD-10-CM | POA: Diagnosis not present

## 2017-07-16 DIAGNOSIS — E663 Overweight: Secondary | ICD-10-CM | POA: Diagnosis not present

## 2017-07-16 DIAGNOSIS — R350 Frequency of micturition: Secondary | ICD-10-CM | POA: Diagnosis not present

## 2017-07-16 DIAGNOSIS — I10 Essential (primary) hypertension: Secondary | ICD-10-CM | POA: Diagnosis not present

## 2017-07-16 DIAGNOSIS — K219 Gastro-esophageal reflux disease without esophagitis: Secondary | ICD-10-CM | POA: Diagnosis not present

## 2017-07-16 DIAGNOSIS — E782 Mixed hyperlipidemia: Secondary | ICD-10-CM | POA: Diagnosis not present

## 2017-07-16 DIAGNOSIS — Z79899 Other long term (current) drug therapy: Secondary | ICD-10-CM | POA: Diagnosis not present

## 2017-07-16 DIAGNOSIS — J309 Allergic rhinitis, unspecified: Secondary | ICD-10-CM | POA: Diagnosis not present

## 2017-07-16 DIAGNOSIS — E559 Vitamin D deficiency, unspecified: Secondary | ICD-10-CM | POA: Diagnosis not present

## 2017-08-28 DIAGNOSIS — L570 Actinic keratosis: Secondary | ICD-10-CM | POA: Diagnosis not present

## 2017-08-28 DIAGNOSIS — C44319 Basal cell carcinoma of skin of other parts of face: Secondary | ICD-10-CM | POA: Diagnosis not present

## 2017-08-28 DIAGNOSIS — D0439 Carcinoma in situ of skin of other parts of face: Secondary | ICD-10-CM | POA: Diagnosis not present

## 2017-09-03 DIAGNOSIS — R69 Illness, unspecified: Secondary | ICD-10-CM | POA: Diagnosis not present

## 2017-09-11 DIAGNOSIS — R69 Illness, unspecified: Secondary | ICD-10-CM | POA: Diagnosis not present

## 2017-09-23 DIAGNOSIS — D473 Essential (hemorrhagic) thrombocythemia: Secondary | ICD-10-CM | POA: Diagnosis not present

## 2017-10-01 DIAGNOSIS — Z9181 History of falling: Secondary | ICD-10-CM | POA: Diagnosis not present

## 2017-10-01 DIAGNOSIS — Z1331 Encounter for screening for depression: Secondary | ICD-10-CM | POA: Diagnosis not present

## 2017-10-01 DIAGNOSIS — Z0000 Encounter for general adult medical examination without abnormal findings: Secondary | ICD-10-CM | POA: Diagnosis not present

## 2017-10-01 DIAGNOSIS — E785 Hyperlipidemia, unspecified: Secondary | ICD-10-CM | POA: Diagnosis not present

## 2017-10-02 DIAGNOSIS — H2513 Age-related nuclear cataract, bilateral: Secondary | ICD-10-CM | POA: Diagnosis not present

## 2017-10-02 DIAGNOSIS — H5203 Hypermetropia, bilateral: Secondary | ICD-10-CM | POA: Diagnosis not present

## 2017-10-02 DIAGNOSIS — H4720 Unspecified optic atrophy: Secondary | ICD-10-CM | POA: Diagnosis not present

## 2017-10-07 DIAGNOSIS — L570 Actinic keratosis: Secondary | ICD-10-CM | POA: Diagnosis not present

## 2017-10-07 DIAGNOSIS — C44319 Basal cell carcinoma of skin of other parts of face: Secondary | ICD-10-CM | POA: Diagnosis not present

## 2017-10-28 DIAGNOSIS — H353131 Nonexudative age-related macular degeneration, bilateral, early dry stage: Secondary | ICD-10-CM | POA: Diagnosis not present

## 2017-10-28 DIAGNOSIS — R69 Illness, unspecified: Secondary | ICD-10-CM | POA: Diagnosis not present

## 2017-10-28 DIAGNOSIS — H259 Unspecified age-related cataract: Secondary | ICD-10-CM | POA: Diagnosis not present

## 2017-10-28 DIAGNOSIS — I10 Essential (primary) hypertension: Secondary | ICD-10-CM | POA: Diagnosis not present

## 2017-10-28 DIAGNOSIS — J449 Chronic obstructive pulmonary disease, unspecified: Secondary | ICD-10-CM | POA: Diagnosis not present

## 2017-10-28 DIAGNOSIS — H02112 Cicatricial ectropion of right lower eyelid: Secondary | ICD-10-CM | POA: Diagnosis not present

## 2017-10-28 DIAGNOSIS — H02115 Cicatricial ectropion of left lower eyelid: Secondary | ICD-10-CM | POA: Diagnosis not present

## 2017-10-28 DIAGNOSIS — G4730 Sleep apnea, unspecified: Secondary | ICD-10-CM | POA: Diagnosis not present

## 2017-10-28 DIAGNOSIS — D649 Anemia, unspecified: Secondary | ICD-10-CM | POA: Diagnosis not present

## 2017-10-28 DIAGNOSIS — M1990 Unspecified osteoarthritis, unspecified site: Secondary | ICD-10-CM | POA: Diagnosis not present

## 2017-10-28 DIAGNOSIS — H2512 Age-related nuclear cataract, left eye: Secondary | ICD-10-CM | POA: Diagnosis not present

## 2017-11-13 DIAGNOSIS — I10 Essential (primary) hypertension: Secondary | ICD-10-CM | POA: Diagnosis not present

## 2017-11-13 DIAGNOSIS — Z6829 Body mass index (BMI) 29.0-29.9, adult: Secondary | ICD-10-CM | POA: Diagnosis not present

## 2017-11-13 DIAGNOSIS — E782 Mixed hyperlipidemia: Secondary | ICD-10-CM | POA: Diagnosis not present

## 2017-11-13 DIAGNOSIS — M5440 Lumbago with sciatica, unspecified side: Secondary | ICD-10-CM | POA: Diagnosis not present

## 2017-11-13 DIAGNOSIS — Z1331 Encounter for screening for depression: Secondary | ICD-10-CM | POA: Diagnosis not present

## 2017-11-13 DIAGNOSIS — M7071 Other bursitis of hip, right hip: Secondary | ICD-10-CM | POA: Diagnosis not present

## 2017-11-13 DIAGNOSIS — E663 Overweight: Secondary | ICD-10-CM | POA: Diagnosis not present

## 2017-11-13 DIAGNOSIS — Z79899 Other long term (current) drug therapy: Secondary | ICD-10-CM | POA: Diagnosis not present

## 2017-11-13 DIAGNOSIS — J449 Chronic obstructive pulmonary disease, unspecified: Secondary | ICD-10-CM | POA: Diagnosis not present

## 2017-11-13 DIAGNOSIS — E559 Vitamin D deficiency, unspecified: Secondary | ICD-10-CM | POA: Diagnosis not present

## 2017-11-18 DIAGNOSIS — H2511 Age-related nuclear cataract, right eye: Secondary | ICD-10-CM | POA: Diagnosis not present

## 2017-11-18 DIAGNOSIS — Z01818 Encounter for other preprocedural examination: Secondary | ICD-10-CM | POA: Diagnosis not present

## 2017-11-25 DIAGNOSIS — H259 Unspecified age-related cataract: Secondary | ICD-10-CM | POA: Diagnosis not present

## 2017-11-25 DIAGNOSIS — Z7951 Long term (current) use of inhaled steroids: Secondary | ICD-10-CM | POA: Diagnosis not present

## 2017-11-25 DIAGNOSIS — M1990 Unspecified osteoarthritis, unspecified site: Secondary | ICD-10-CM | POA: Diagnosis not present

## 2017-11-25 DIAGNOSIS — Z79899 Other long term (current) drug therapy: Secondary | ICD-10-CM | POA: Diagnosis not present

## 2017-11-25 DIAGNOSIS — I10 Essential (primary) hypertension: Secondary | ICD-10-CM | POA: Diagnosis not present

## 2017-11-25 DIAGNOSIS — D649 Anemia, unspecified: Secondary | ICD-10-CM | POA: Diagnosis not present

## 2017-11-25 DIAGNOSIS — G4733 Obstructive sleep apnea (adult) (pediatric): Secondary | ICD-10-CM | POA: Diagnosis not present

## 2017-11-25 DIAGNOSIS — H2511 Age-related nuclear cataract, right eye: Secondary | ICD-10-CM | POA: Diagnosis not present

## 2017-11-25 DIAGNOSIS — Z7982 Long term (current) use of aspirin: Secondary | ICD-10-CM | POA: Diagnosis not present

## 2017-11-25 DIAGNOSIS — R69 Illness, unspecified: Secondary | ICD-10-CM | POA: Diagnosis not present

## 2017-11-25 DIAGNOSIS — J449 Chronic obstructive pulmonary disease, unspecified: Secondary | ICD-10-CM | POA: Diagnosis not present

## 2017-12-23 DIAGNOSIS — G8929 Other chronic pain: Secondary | ICD-10-CM | POA: Diagnosis not present

## 2017-12-23 DIAGNOSIS — Z79899 Other long term (current) drug therapy: Secondary | ICD-10-CM | POA: Diagnosis not present

## 2017-12-23 DIAGNOSIS — D539 Nutritional anemia, unspecified: Secondary | ICD-10-CM | POA: Diagnosis not present

## 2017-12-23 DIAGNOSIS — Z85820 Personal history of malignant melanoma of skin: Secondary | ICD-10-CM | POA: Diagnosis not present

## 2017-12-23 DIAGNOSIS — J449 Chronic obstructive pulmonary disease, unspecified: Secondary | ICD-10-CM | POA: Diagnosis not present

## 2017-12-23 DIAGNOSIS — M8580 Other specified disorders of bone density and structure, unspecified site: Secondary | ICD-10-CM | POA: Diagnosis not present

## 2017-12-23 DIAGNOSIS — M5136 Other intervertebral disc degeneration, lumbar region: Secondary | ICD-10-CM | POA: Diagnosis not present

## 2017-12-23 DIAGNOSIS — D473 Essential (hemorrhagic) thrombocythemia: Secondary | ICD-10-CM | POA: Diagnosis not present

## 2017-12-23 DIAGNOSIS — Z8544 Personal history of malignant neoplasm of other female genital organs: Secondary | ICD-10-CM | POA: Diagnosis not present

## 2017-12-23 DIAGNOSIS — Z86008 Personal history of in-situ neoplasm of other site: Secondary | ICD-10-CM | POA: Diagnosis not present

## 2017-12-23 DIAGNOSIS — M8589 Other specified disorders of bone density and structure, multiple sites: Secondary | ICD-10-CM | POA: Diagnosis not present

## 2017-12-24 DIAGNOSIS — H52223 Regular astigmatism, bilateral: Secondary | ICD-10-CM | POA: Diagnosis not present

## 2018-01-01 DIAGNOSIS — L570 Actinic keratosis: Secondary | ICD-10-CM | POA: Diagnosis not present

## 2018-01-27 DIAGNOSIS — M545 Low back pain, unspecified: Secondary | ICD-10-CM

## 2018-01-27 DIAGNOSIS — M4120 Other idiopathic scoliosis, site unspecified: Secondary | ICD-10-CM

## 2018-01-27 DIAGNOSIS — M47816 Spondylosis without myelopathy or radiculopathy, lumbar region: Secondary | ICD-10-CM | POA: Diagnosis not present

## 2018-01-27 DIAGNOSIS — G8929 Other chronic pain: Secondary | ICD-10-CM | POA: Insufficient documentation

## 2018-01-27 HISTORY — DX: Other chronic pain: G89.29

## 2018-01-27 HISTORY — DX: Other idiopathic scoliosis, site unspecified: M41.20

## 2018-01-27 HISTORY — DX: Low back pain, unspecified: M54.50

## 2018-02-02 DIAGNOSIS — M47816 Spondylosis without myelopathy or radiculopathy, lumbar region: Secondary | ICD-10-CM | POA: Diagnosis not present

## 2018-02-12 DIAGNOSIS — Z79899 Other long term (current) drug therapy: Secondary | ICD-10-CM | POA: Diagnosis not present

## 2018-02-12 DIAGNOSIS — E559 Vitamin D deficiency, unspecified: Secondary | ICD-10-CM | POA: Diagnosis not present

## 2018-02-12 DIAGNOSIS — Z1339 Encounter for screening examination for other mental health and behavioral disorders: Secondary | ICD-10-CM | POA: Diagnosis not present

## 2018-02-12 DIAGNOSIS — K219 Gastro-esophageal reflux disease without esophagitis: Secondary | ICD-10-CM | POA: Diagnosis not present

## 2018-02-12 DIAGNOSIS — M5440 Lumbago with sciatica, unspecified side: Secondary | ICD-10-CM | POA: Diagnosis not present

## 2018-02-12 DIAGNOSIS — E782 Mixed hyperlipidemia: Secondary | ICD-10-CM | POA: Diagnosis not present

## 2018-02-12 DIAGNOSIS — I10 Essential (primary) hypertension: Secondary | ICD-10-CM | POA: Diagnosis not present

## 2018-02-12 DIAGNOSIS — J309 Allergic rhinitis, unspecified: Secondary | ICD-10-CM | POA: Diagnosis not present

## 2018-02-12 DIAGNOSIS — J449 Chronic obstructive pulmonary disease, unspecified: Secondary | ICD-10-CM | POA: Diagnosis not present

## 2018-02-12 DIAGNOSIS — Z6829 Body mass index (BMI) 29.0-29.9, adult: Secondary | ICD-10-CM | POA: Diagnosis not present

## 2018-02-24 DIAGNOSIS — M47816 Spondylosis without myelopathy or radiculopathy, lumbar region: Secondary | ICD-10-CM | POA: Diagnosis not present

## 2018-03-17 DIAGNOSIS — I10 Essential (primary) hypertension: Secondary | ICD-10-CM | POA: Diagnosis not present

## 2018-03-17 DIAGNOSIS — M545 Low back pain: Secondary | ICD-10-CM | POA: Diagnosis not present

## 2018-03-17 DIAGNOSIS — M47816 Spondylosis without myelopathy or radiculopathy, lumbar region: Secondary | ICD-10-CM | POA: Diagnosis not present

## 2018-03-17 DIAGNOSIS — Z6827 Body mass index (BMI) 27.0-27.9, adult: Secondary | ICD-10-CM | POA: Diagnosis not present

## 2018-03-25 DIAGNOSIS — N289 Disorder of kidney and ureter, unspecified: Secondary | ICD-10-CM | POA: Diagnosis not present

## 2018-03-25 DIAGNOSIS — Z6829 Body mass index (BMI) 29.0-29.9, adult: Secondary | ICD-10-CM | POA: Diagnosis not present

## 2018-03-25 DIAGNOSIS — D473 Essential (hemorrhagic) thrombocythemia: Secondary | ICD-10-CM | POA: Diagnosis not present

## 2018-03-25 DIAGNOSIS — Z8544 Personal history of malignant neoplasm of other female genital organs: Secondary | ICD-10-CM | POA: Diagnosis not present

## 2018-03-25 DIAGNOSIS — R300 Dysuria: Secondary | ICD-10-CM | POA: Diagnosis not present

## 2018-03-25 DIAGNOSIS — Z85820 Personal history of malignant melanoma of skin: Secondary | ICD-10-CM | POA: Diagnosis not present

## 2018-03-25 DIAGNOSIS — J449 Chronic obstructive pulmonary disease, unspecified: Secondary | ICD-10-CM | POA: Diagnosis not present

## 2018-03-25 DIAGNOSIS — E559 Vitamin D deficiency, unspecified: Secondary | ICD-10-CM | POA: Diagnosis not present

## 2018-03-25 DIAGNOSIS — M7989 Other specified soft tissue disorders: Secondary | ICD-10-CM | POA: Diagnosis not present

## 2018-03-25 DIAGNOSIS — N189 Chronic kidney disease, unspecified: Secondary | ICD-10-CM | POA: Diagnosis not present

## 2018-03-25 DIAGNOSIS — R251 Tremor, unspecified: Secondary | ICD-10-CM | POA: Diagnosis not present

## 2018-03-25 DIAGNOSIS — D72829 Elevated white blood cell count, unspecified: Secondary | ICD-10-CM | POA: Diagnosis not present

## 2018-03-25 DIAGNOSIS — I10 Essential (primary) hypertension: Secondary | ICD-10-CM | POA: Diagnosis not present

## 2018-03-25 DIAGNOSIS — M8580 Other specified disorders of bone density and structure, unspecified site: Secondary | ICD-10-CM | POA: Diagnosis not present

## 2018-03-25 DIAGNOSIS — R350 Frequency of micturition: Secondary | ICD-10-CM | POA: Diagnosis not present

## 2018-04-01 DIAGNOSIS — H01019 Ulcerative blepharitis unspecified eye, unspecified eyelid: Secondary | ICD-10-CM | POA: Diagnosis not present

## 2018-04-14 DIAGNOSIS — M47816 Spondylosis without myelopathy or radiculopathy, lumbar region: Secondary | ICD-10-CM | POA: Diagnosis not present

## 2018-04-21 DIAGNOSIS — M47816 Spondylosis without myelopathy or radiculopathy, lumbar region: Secondary | ICD-10-CM | POA: Diagnosis not present

## 2018-05-05 DIAGNOSIS — L570 Actinic keratosis: Secondary | ICD-10-CM | POA: Diagnosis not present

## 2018-05-05 DIAGNOSIS — C44321 Squamous cell carcinoma of skin of nose: Secondary | ICD-10-CM | POA: Diagnosis not present

## 2018-05-20 DIAGNOSIS — I10 Essential (primary) hypertension: Secondary | ICD-10-CM | POA: Diagnosis not present

## 2018-05-20 DIAGNOSIS — E782 Mixed hyperlipidemia: Secondary | ICD-10-CM | POA: Diagnosis not present

## 2018-05-20 DIAGNOSIS — K219 Gastro-esophageal reflux disease without esophagitis: Secondary | ICD-10-CM | POA: Diagnosis not present

## 2018-05-20 DIAGNOSIS — Z23 Encounter for immunization: Secondary | ICD-10-CM | POA: Diagnosis not present

## 2018-05-20 DIAGNOSIS — E663 Overweight: Secondary | ICD-10-CM | POA: Diagnosis not present

## 2018-05-20 DIAGNOSIS — Z6829 Body mass index (BMI) 29.0-29.9, adult: Secondary | ICD-10-CM | POA: Diagnosis not present

## 2018-05-20 DIAGNOSIS — E559 Vitamin D deficiency, unspecified: Secondary | ICD-10-CM | POA: Diagnosis not present

## 2018-05-20 DIAGNOSIS — N289 Disorder of kidney and ureter, unspecified: Secondary | ICD-10-CM | POA: Diagnosis not present

## 2018-05-20 DIAGNOSIS — J449 Chronic obstructive pulmonary disease, unspecified: Secondary | ICD-10-CM | POA: Diagnosis not present

## 2018-05-21 DIAGNOSIS — I10 Essential (primary) hypertension: Secondary | ICD-10-CM | POA: Diagnosis not present

## 2018-05-21 DIAGNOSIS — M47816 Spondylosis without myelopathy or radiculopathy, lumbar region: Secondary | ICD-10-CM | POA: Diagnosis not present

## 2018-05-21 DIAGNOSIS — M545 Low back pain: Secondary | ICD-10-CM | POA: Diagnosis not present

## 2018-05-21 DIAGNOSIS — Z6827 Body mass index (BMI) 27.0-27.9, adult: Secondary | ICD-10-CM | POA: Diagnosis not present

## 2018-05-25 DIAGNOSIS — Z86002 Personal history of in-situ neoplasm of other and unspecified genital organs: Secondary | ICD-10-CM

## 2018-05-25 DIAGNOSIS — J449 Chronic obstructive pulmonary disease, unspecified: Secondary | ICD-10-CM

## 2018-05-25 DIAGNOSIS — G8929 Other chronic pain: Secondary | ICD-10-CM

## 2018-05-25 DIAGNOSIS — Z85828 Personal history of other malignant neoplasm of skin: Secondary | ICD-10-CM

## 2018-05-25 DIAGNOSIS — D539 Nutritional anemia, unspecified: Secondary | ICD-10-CM

## 2018-05-25 DIAGNOSIS — D473 Essential (hemorrhagic) thrombocythemia: Secondary | ICD-10-CM | POA: Diagnosis not present

## 2018-05-25 DIAGNOSIS — Z79899 Other long term (current) drug therapy: Secondary | ICD-10-CM | POA: Diagnosis not present

## 2018-05-25 DIAGNOSIS — D72829 Elevated white blood cell count, unspecified: Secondary | ICD-10-CM | POA: Diagnosis not present

## 2018-05-25 DIAGNOSIS — N189 Chronic kidney disease, unspecified: Secondary | ICD-10-CM | POA: Diagnosis not present

## 2018-05-25 DIAGNOSIS — Z85820 Personal history of malignant melanoma of skin: Secondary | ICD-10-CM | POA: Diagnosis not present

## 2018-05-25 DIAGNOSIS — M8589 Other specified disorders of bone density and structure, multiple sites: Secondary | ICD-10-CM | POA: Diagnosis not present

## 2018-05-25 DIAGNOSIS — M8580 Other specified disorders of bone density and structure, unspecified site: Secondary | ICD-10-CM

## 2018-05-25 DIAGNOSIS — M5136 Other intervertebral disc degeneration, lumbar region: Secondary | ICD-10-CM

## 2018-06-12 DIAGNOSIS — D72829 Elevated white blood cell count, unspecified: Secondary | ICD-10-CM | POA: Diagnosis not present

## 2018-06-12 DIAGNOSIS — R7989 Other specified abnormal findings of blood chemistry: Secondary | ICD-10-CM | POA: Diagnosis not present

## 2018-06-12 DIAGNOSIS — R000 Tachycardia, unspecified: Secondary | ICD-10-CM | POA: Diagnosis not present

## 2018-06-12 DIAGNOSIS — R4702 Dysphasia: Secondary | ICD-10-CM | POA: Diagnosis not present

## 2018-06-12 DIAGNOSIS — Z7982 Long term (current) use of aspirin: Secondary | ICD-10-CM | POA: Diagnosis not present

## 2018-06-12 DIAGNOSIS — R58 Hemorrhage, not elsewhere classified: Secondary | ICD-10-CM | POA: Diagnosis not present

## 2018-06-12 DIAGNOSIS — A049 Bacterial intestinal infection, unspecified: Secondary | ICD-10-CM | POA: Diagnosis not present

## 2018-06-12 DIAGNOSIS — Z7952 Long term (current) use of systemic steroids: Secondary | ICD-10-CM | POA: Diagnosis not present

## 2018-06-12 DIAGNOSIS — Y929 Unspecified place or not applicable: Secondary | ICD-10-CM | POA: Diagnosis not present

## 2018-06-12 DIAGNOSIS — R1031 Right lower quadrant pain: Secondary | ICD-10-CM | POA: Diagnosis not present

## 2018-06-12 DIAGNOSIS — R0789 Other chest pain: Secondary | ICD-10-CM | POA: Diagnosis not present

## 2018-06-12 DIAGNOSIS — K633 Ulcer of intestine: Secondary | ICD-10-CM | POA: Diagnosis not present

## 2018-06-12 DIAGNOSIS — A084 Viral intestinal infection, unspecified: Secondary | ICD-10-CM | POA: Diagnosis not present

## 2018-06-12 DIAGNOSIS — R195 Other fecal abnormalities: Secondary | ICD-10-CM | POA: Diagnosis not present

## 2018-06-12 DIAGNOSIS — R1111 Vomiting without nausea: Secondary | ICD-10-CM | POA: Diagnosis not present

## 2018-06-12 DIAGNOSIS — E559 Vitamin D deficiency, unspecified: Secondary | ICD-10-CM | POA: Diagnosis not present

## 2018-06-12 DIAGNOSIS — I10 Essential (primary) hypertension: Secondary | ICD-10-CM | POA: Diagnosis not present

## 2018-06-12 DIAGNOSIS — K921 Melena: Secondary | ICD-10-CM | POA: Diagnosis not present

## 2018-06-12 DIAGNOSIS — D473 Essential (hemorrhagic) thrombocythemia: Secondary | ICD-10-CM | POA: Diagnosis not present

## 2018-06-12 DIAGNOSIS — R1032 Left lower quadrant pain: Secondary | ICD-10-CM | POA: Diagnosis not present

## 2018-06-12 DIAGNOSIS — R0600 Dyspnea, unspecified: Secondary | ICD-10-CM | POA: Diagnosis not present

## 2018-06-12 DIAGNOSIS — K5792 Diverticulitis of intestine, part unspecified, without perforation or abscess without bleeding: Secondary | ICD-10-CM | POA: Diagnosis not present

## 2018-06-12 DIAGNOSIS — R197 Diarrhea, unspecified: Secondary | ICD-10-CM | POA: Diagnosis not present

## 2018-06-12 DIAGNOSIS — R112 Nausea with vomiting, unspecified: Secondary | ICD-10-CM | POA: Diagnosis not present

## 2018-06-12 DIAGNOSIS — K922 Gastrointestinal hemorrhage, unspecified: Secondary | ICD-10-CM | POA: Diagnosis not present

## 2018-06-12 DIAGNOSIS — R0902 Hypoxemia: Secondary | ICD-10-CM | POA: Diagnosis not present

## 2018-06-12 DIAGNOSIS — T451X5A Adverse effect of antineoplastic and immunosuppressive drugs, initial encounter: Secondary | ICD-10-CM | POA: Diagnosis not present

## 2018-06-12 DIAGNOSIS — D7589 Other specified diseases of blood and blood-forming organs: Secondary | ICD-10-CM | POA: Diagnosis not present

## 2018-06-12 DIAGNOSIS — I248 Other forms of acute ischemic heart disease: Secondary | ICD-10-CM | POA: Diagnosis not present

## 2018-06-12 DIAGNOSIS — K920 Hematemesis: Secondary | ICD-10-CM | POA: Diagnosis not present

## 2018-06-12 DIAGNOSIS — D539 Nutritional anemia, unspecified: Secondary | ICD-10-CM | POA: Diagnosis not present

## 2018-06-12 DIAGNOSIS — R52 Pain, unspecified: Secondary | ICD-10-CM | POA: Diagnosis not present

## 2018-06-12 DIAGNOSIS — R1084 Generalized abdominal pain: Secondary | ICD-10-CM | POA: Diagnosis not present

## 2018-06-12 DIAGNOSIS — I083 Combined rheumatic disorders of mitral, aortic and tricuspid valves: Secondary | ICD-10-CM | POA: Diagnosis not present

## 2018-06-12 DIAGNOSIS — K5732 Diverticulitis of large intestine without perforation or abscess without bleeding: Secondary | ICD-10-CM | POA: Diagnosis not present

## 2018-06-13 DIAGNOSIS — R1310 Dysphagia, unspecified: Secondary | ICD-10-CM | POA: Diagnosis not present

## 2018-06-13 DIAGNOSIS — D539 Nutritional anemia, unspecified: Secondary | ICD-10-CM | POA: Diagnosis not present

## 2018-06-13 DIAGNOSIS — R195 Other fecal abnormalities: Secondary | ICD-10-CM | POA: Diagnosis not present

## 2018-06-13 DIAGNOSIS — R0789 Other chest pain: Secondary | ICD-10-CM | POA: Diagnosis not present

## 2018-06-13 DIAGNOSIS — R109 Unspecified abdominal pain: Secondary | ICD-10-CM | POA: Diagnosis not present

## 2018-06-13 DIAGNOSIS — R933 Abnormal findings on diagnostic imaging of other parts of digestive tract: Secondary | ICD-10-CM | POA: Diagnosis not present

## 2018-06-13 DIAGNOSIS — Z7982 Long term (current) use of aspirin: Secondary | ICD-10-CM | POA: Diagnosis not present

## 2018-06-13 DIAGNOSIS — R7989 Other specified abnormal findings of blood chemistry: Secondary | ICD-10-CM | POA: Diagnosis not present

## 2018-06-13 DIAGNOSIS — K633 Ulcer of intestine: Secondary | ICD-10-CM | POA: Diagnosis not present

## 2018-06-13 DIAGNOSIS — K921 Melena: Secondary | ICD-10-CM | POA: Diagnosis not present

## 2018-06-13 DIAGNOSIS — K922 Gastrointestinal hemorrhage, unspecified: Secondary | ICD-10-CM | POA: Diagnosis not present

## 2018-06-13 DIAGNOSIS — D72829 Elevated white blood cell count, unspecified: Secondary | ICD-10-CM | POA: Diagnosis not present

## 2018-06-13 DIAGNOSIS — E559 Vitamin D deficiency, unspecified: Secondary | ICD-10-CM | POA: Diagnosis not present

## 2018-06-13 DIAGNOSIS — Z8249 Family history of ischemic heart disease and other diseases of the circulatory system: Secondary | ICD-10-CM | POA: Diagnosis not present

## 2018-06-13 DIAGNOSIS — I10 Essential (primary) hypertension: Secondary | ICD-10-CM | POA: Diagnosis not present

## 2018-06-13 DIAGNOSIS — D473 Essential (hemorrhagic) thrombocythemia: Secondary | ICD-10-CM | POA: Diagnosis not present

## 2018-06-13 DIAGNOSIS — R1110 Vomiting, unspecified: Secondary | ICD-10-CM | POA: Diagnosis not present

## 2018-06-13 DIAGNOSIS — R12 Heartburn: Secondary | ICD-10-CM | POA: Diagnosis not present

## 2018-06-13 DIAGNOSIS — I083 Combined rheumatic disorders of mitral, aortic and tricuspid valves: Secondary | ICD-10-CM | POA: Diagnosis not present

## 2018-06-14 DIAGNOSIS — R1310 Dysphagia, unspecified: Secondary | ICD-10-CM | POA: Diagnosis not present

## 2018-06-14 DIAGNOSIS — K529 Noninfective gastroenteritis and colitis, unspecified: Secondary | ICD-10-CM | POA: Diagnosis not present

## 2018-06-14 DIAGNOSIS — I10 Essential (primary) hypertension: Secondary | ICD-10-CM | POA: Diagnosis not present

## 2018-06-14 DIAGNOSIS — K921 Melena: Secondary | ICD-10-CM | POA: Diagnosis not present

## 2018-06-14 DIAGNOSIS — K633 Ulcer of intestine: Secondary | ICD-10-CM | POA: Diagnosis not present

## 2018-06-14 DIAGNOSIS — E559 Vitamin D deficiency, unspecified: Secondary | ICD-10-CM | POA: Diagnosis not present

## 2018-06-14 DIAGNOSIS — R195 Other fecal abnormalities: Secondary | ICD-10-CM | POA: Diagnosis not present

## 2018-06-14 DIAGNOSIS — R7989 Other specified abnormal findings of blood chemistry: Secondary | ICD-10-CM | POA: Diagnosis not present

## 2018-06-14 DIAGNOSIS — D473 Essential (hemorrhagic) thrombocythemia: Secondary | ICD-10-CM | POA: Diagnosis not present

## 2018-06-15 DIAGNOSIS — R9431 Abnormal electrocardiogram [ECG] [EKG]: Secondary | ICD-10-CM | POA: Diagnosis not present

## 2018-06-17 DIAGNOSIS — R195 Other fecal abnormalities: Secondary | ICD-10-CM | POA: Diagnosis not present

## 2018-06-17 DIAGNOSIS — R079 Chest pain, unspecified: Secondary | ICD-10-CM | POA: Diagnosis not present

## 2018-06-17 DIAGNOSIS — D649 Anemia, unspecified: Secondary | ICD-10-CM | POA: Diagnosis not present

## 2018-06-17 DIAGNOSIS — D510 Vitamin B12 deficiency anemia due to intrinsic factor deficiency: Secondary | ICD-10-CM | POA: Diagnosis not present

## 2018-06-17 DIAGNOSIS — K921 Melena: Secondary | ICD-10-CM | POA: Diagnosis not present

## 2018-06-23 ENCOUNTER — Ambulatory Visit: Payer: Medicare HMO | Admitting: Cardiology

## 2018-06-23 ENCOUNTER — Encounter: Payer: Self-pay | Admitting: Cardiology

## 2018-06-23 ENCOUNTER — Telehealth: Payer: Self-pay

## 2018-06-23 VITALS — BP 130/64 | HR 84 | Ht 65.0 in | Wt 164.8 lb

## 2018-06-23 DIAGNOSIS — J431 Panlobular emphysema: Secondary | ICD-10-CM

## 2018-06-23 DIAGNOSIS — D649 Anemia, unspecified: Secondary | ICD-10-CM | POA: Diagnosis not present

## 2018-06-23 DIAGNOSIS — Z7901 Long term (current) use of anticoagulants: Secondary | ICD-10-CM | POA: Diagnosis not present

## 2018-06-23 DIAGNOSIS — K922 Gastrointestinal hemorrhage, unspecified: Secondary | ICD-10-CM | POA: Insufficient documentation

## 2018-06-23 DIAGNOSIS — I10 Essential (primary) hypertension: Secondary | ICD-10-CM | POA: Diagnosis not present

## 2018-06-23 DIAGNOSIS — K921 Melena: Secondary | ICD-10-CM | POA: Diagnosis not present

## 2018-06-23 DIAGNOSIS — Z01810 Encounter for preprocedural cardiovascular examination: Secondary | ICD-10-CM

## 2018-06-23 HISTORY — DX: Gastrointestinal hemorrhage, unspecified: K92.2

## 2018-06-23 LAB — PROTIME-INR: INR: 1.1 (ref 0.9–1.1)

## 2018-06-23 NOTE — Patient Instructions (Signed)
Medication Instructions:  Your physician recommends that you continue on your current medications as directed. Please refer to the Current Medication list given to you today.  If you need a refill on your cardiac medications before your next appointment, please call your pharmacy.   Lab work: None  If you have labs (blood work) drawn today and your tests are completely normal, you will receive your results only by: Marland Kitchen MyChart Message (if you have MyChart) OR . A paper copy in the mail If you have any lab test that is abnormal or we need to change your treatment, we will call you to review the results.  Testing/Procedures: None  Follow-Up: At [facility], you and your health needs are our priority.  As part of our continuing mission to provide you with exceptional heart care, we have created designated Provider Care Teams.  These Care Teams include your primary Cardiologist (physician) and Advanced Practice Providers (APPs -  Physician Assistants and Nurse Practitioners) who all work together to provide you with the care you need, when you need it.  You will need a follow up appointment in 3 months.  Please call our office 2 months in advance to schedule this appointment.  You may see another member of our [employer] Provider Team in [place]: Jenne Campus, MD . Shirlee More, MD  Any Other Special Instructions Will Be Listed Below (If Applicable).  Please go to admissions at [facility] per the request of Dr. Lyda Jester and Dr. Geraldo Pitter

## 2018-06-23 NOTE — Progress Notes (Signed)
Cardiology Office Note:    Date:  06/23/2018   ID:  Yolanda Flowers, DOB [birth year], MRN [id number]  PCP:  [name], MD  Cardiologist:  Jenean Lindau, MD   Referring MD: [name], MD    ASSESSMENT:    1. Preop cardiovascular exam   2. Essential hypertension   3. Panlobular emphysema ([place])   4. Gastrointestinal hemorrhage with melena    PLAN:    In order of problems listed above:  1. I discussed my findings with the patient at extensive length.  She is here for preop rather pre-endoscopy cardiovascular evaluation.  She has some symptoms of chest tightness on exertion.  She is asymptomatic at my current evaluation.  She is had significant blood loss and continues to do so.  In this case I think it would be appropriate for her to receive blood transfusions and keep her hemoglobin greater than 10 and evaluated with endoscopy to contain her blood loss.  At this point I do not see any indication for a cardiac evaluation as far as ischemia is concerned.  This will have to be on the back plan at this time because any form of cardiac evaluation and intervention will need anticoagulation and heparin which is in absolute contraindication in her case with active GI bleeding.  I discussed this with her at length. 2. At the time of this dictation I have discussed her case extensively with Dr. Lyda Jester the gastroenterologist and he agrees to have her sent over to [place] [facility] for admission and the needful.  Patient is asymptomatic and comfortable and agreeable to do so.  He wants to drive her own self and clinically she appears comfortable to do so.   Medication Adjustments/Labs and Tests Ordered: Current medicines are reviewed at length with the patient today.  Concerns regarding medicines are outlined above.  No orders of the defined types were placed in this encounter.  No orders of the defined types were placed in this encounter.    History of Present Illness:    Yolanda Flowers is a 77 y.o. female who is being seen today for the evaluation of gastrointestinal bleeding at the request of [name], MD.  Patient is a pleasant 77 year old female.  She has past medical history of essential hypertension.  She has no history from a cardiovascular standpoint.  She mentions to me that over the past several days she has been noticing black stools which have been increasing in frequency.  She went to the emergency room for the same reason and was treated and released.  Subsequently it subsided for a day or 2 and today again it has increased.  She tells me that she has had 4-5 bowel movements with black stools.  She has shortness of breath on exertion.  She also mentions to me that when she exerts more than usual she has some chest tightness and she has noticed that recently.  At the time of my evaluation, the patient is alert awake oriented and in no distress.  Past Medical History:  Diagnosis Date  . Anxiety   . Arthritis   . Bilateral edema of lower extremity   . Complication of anesthesia    difficulty waking  . COPD (chronic obstructive pulmonary disease) ([place])   . Dyspnea on exertion   . GERD (gastroesophageal reflux disease)   . History of melanoma excision    left greast toe 2015  . History of squamous cell carcinoma excision    face--  multiple excisions  . History of subdural hematoma    2008  . Hypertension   . OSA (obstructive sleep apnea)    intolerant  . Thrombocytosis ([place])    takes hydoxyurea--  MONITORED BY DR Hinton Rao [facility])  . VIN III (vulvar intraepithelial neoplasia III)   . Wears dentures    UPPER AND LOWER PARTIAL  . Wears glasses     Past Surgical History:  Procedure Laterality Date  . ABDOMINAL HYSTERECTOMY  age 42  . KNEE ARTHROSCOPY Left 2004  . MELANOMA EXCISION  2015   left great toe  . REPAIR PERONEAL TENDONS ANKLE  2004  . SUBDURAL HEMATOMA EVACUATION VIA CRANIOTOMY  2008      week later  post-op  [facility]  Surgery  . VULVECTOMY N/A 10/24/2015   Procedure: WIDE LOCAL EXCISION OF THE VULVA ;  Surgeon: Everitt Amber, MD;  Location: [place];  Service: Gynecology;  Laterality: N/A;    Current Medications: Current Meds  Medication Sig  . aspirin EC 81 MG tablet Take 81 mg by mouth.  . Calcium Carb-Cholecalciferol (CALCIUM 600 + D PO) Take 2 tablets by mouth daily.  . ciprofloxacin (CIPRO) 500 MG tablet Take 500 mg by mouth 2 (two) times daily.  . fluticasone (FLONASE) 50 MCG/ACT nasal spray Place 2 sprays into the nose every evening.   . hydroxyurea (HYDREA) 500 MG capsule Take 500-1,000 mg by mouth 2 (two) times daily. Takes 2 tab every am and 1 tab every pm--  STARTING Thursday 10-12-2015 PT CUT DOWN TO 1 TAB IN AM AND PM PRIOR TO SURGERY 10-24-2015  PER DR ROSSI AND DR Hinton Rao ORDERS  . lansoprazole (PREVACID) 30 MG capsule Take 30 mg by mouth every morning.   Marland Kitchen lisinopril (PRINIVIL,ZESTRIL) 20 MG tablet Take 15 mg by mouth every evening.   . metroNIDAZOLE (FLAGYL) 500 MG tablet Take 500 mg by mouth 3 (three) times daily.  . Multiple Vitamin (MULTIVITAMIN) tablet Take 1 tablet by mouth daily.  Marland Kitchen oxyCODONE-acetaminophen (PERCOCET/ROXICET) 5-325 MG tablet Take 1-2 tablets by mouth every 4 (four) hours as needed for severe pain.  . potassium chloride (MICRO-K) 10 MEQ CR capsule TAKE 1 (ONE) CAPSULE BY MOUTH -TAKE ALONG WITH FUROSEMIDE 1 TO 2 TIMES/DAY  . Vitamin D, Ergocalciferol, (DRISDOL) 50000 units CAPS capsule Take 50,000 Units by mouth every 30 (thirty) days.      Allergies:   Penicillins   Social History   Socioeconomic History  . Marital status: Married    Spouse name: Not on file  . Number of children: Not on file  . Years of education: Not on file  . Highest education level: Not on file  Occupational History  . Not on file  Social Needs  . Financial resource strain: Not on file  . Food insecurity:    Worry: Not on file    Inability: Not on file  . Transportation  needs:    Medical: Not on file    Non-medical: Not on file  Tobacco Use  . Smoking status: Never Smoker  . Smokeless tobacco: Never Used  Substance and Sexual Activity  . Alcohol use: No  . Drug use: No  . Sexual activity: Not on file  Lifestyle  . Physical activity:    Days per week: Not on file    Minutes per session: Not on file  . Stress: Not on file  Relationships  . Social connections:    Talks on phone: Not on file  Gets together: Not on file    Attends religious service: Not on file    Active member of club or organization: Not on file    Attends meetings of clubs or organizations: Not on file    Relationship status: Not on file  Other Topics Concern  . Not on file  Social History Narrative  . Not on file     Family History: The patient's family history includes Clotting disorder in her mother; Stroke in her father.  ROS:   Please see the history of present illness.    All other systems reviewed and are negative.  EKGs/Labs/Other Studies Reviewed:    The following studies were reviewed today: EKG reveals sinus rhythm and nonspecific ST-T changes.   Recent Labs: No results found for requested labs within last 8760 hours.  Recent Lipid Panel No results found for: CHOL, TRIG, HDL, CHOLHDL, VLDL, LDLCALC, LDLDIRECT  Physical Exam:    VS:  BP 130/64 (BP Location: Right Arm, Patient Position: Sitting, Cuff Size: Normal)   Pulse 84   Ht 5\' 5"  (1.651 m)   Wt 164 lb 12.8 oz (74.8 kg)   SpO2 96%   BMI 27.42 kg/m     Wt Readings from Last 3 Encounters:  06/23/18 164 lb 12.8 oz (74.8 kg)  11/27/15 171 lb 6.4 oz (77.7 kg)  10/24/15 174 lb (78.9 kg)     GEN: Patient is in no acute distress HEENT: Normal NECK: No JVD; No carotid bruits LYMPHATICS: No lymphadenopathy CARDIAC: S1 S2 regular, 2/6 systolic murmur at the apex. RESPIRATORY:  Clear to auscultation without rales, wheezing or rhonchi  ABDOMEN: Soft, non-tender, non-distended MUSCULOSKELETAL:   No edema; No deformity  SKIN: Warm and dry NEUROLOGIC:  Alert and oriented x 3 PSYCHIATRIC:  Normal affect    Signed, Jenean Lindau, MD  06/23/2018 9:45 AM    [place]

## 2018-06-23 NOTE — Addendum Note (Signed)
Addended by: Mattie Marlin on: 06/23/2018 01:21 PM   Modules accepted: Orders

## 2018-06-23 NOTE — Telephone Encounter (Signed)
Left voicemail informing patient that she would be having lexi for surgery clearance. Instructions were given as well as mailed to the patient.

## 2018-06-26 ENCOUNTER — Telehealth: Payer: Self-pay

## 2018-06-26 DIAGNOSIS — I10 Essential (primary) hypertension: Secondary | ICD-10-CM | POA: Diagnosis not present

## 2018-06-26 DIAGNOSIS — Z0189 Encounter for other specified special examinations: Secondary | ICD-10-CM | POA: Diagnosis not present

## 2018-06-26 DIAGNOSIS — R079 Chest pain, unspecified: Secondary | ICD-10-CM | POA: Diagnosis not present

## 2018-06-26 DIAGNOSIS — Z01810 Encounter for preprocedural cardiovascular examination: Secondary | ICD-10-CM | POA: Diagnosis not present

## 2018-06-26 NOTE — Telephone Encounter (Signed)
Informed patient of lexi results; informed to call the office with any further questions or concerns.  

## 2018-07-06 DIAGNOSIS — K5730 Diverticulosis of large intestine without perforation or abscess without bleeding: Secondary | ICD-10-CM | POA: Diagnosis not present

## 2018-07-06 DIAGNOSIS — K222 Esophageal obstruction: Secondary | ICD-10-CM | POA: Diagnosis not present

## 2018-07-06 DIAGNOSIS — D509 Iron deficiency anemia, unspecified: Secondary | ICD-10-CM | POA: Diagnosis not present

## 2018-07-09 DIAGNOSIS — K922 Gastrointestinal hemorrhage, unspecified: Secondary | ICD-10-CM | POA: Diagnosis not present

## 2018-07-09 DIAGNOSIS — Z09 Encounter for follow-up examination after completed treatment for conditions other than malignant neoplasm: Secondary | ICD-10-CM | POA: Diagnosis not present

## 2018-07-09 DIAGNOSIS — Z6829 Body mass index (BMI) 29.0-29.9, adult: Secondary | ICD-10-CM | POA: Diagnosis not present

## 2018-07-09 DIAGNOSIS — E663 Overweight: Secondary | ICD-10-CM | POA: Diagnosis not present

## 2018-07-09 DIAGNOSIS — I10 Essential (primary) hypertension: Secondary | ICD-10-CM | POA: Diagnosis not present

## 2018-07-14 DIAGNOSIS — C44622 Squamous cell carcinoma of skin of right upper limb, including shoulder: Secondary | ICD-10-CM | POA: Diagnosis not present

## 2018-07-23 DIAGNOSIS — K5730 Diverticulosis of large intestine without perforation or abscess without bleeding: Secondary | ICD-10-CM | POA: Diagnosis not present

## 2018-07-23 DIAGNOSIS — K449 Diaphragmatic hernia without obstruction or gangrene: Secondary | ICD-10-CM | POA: Diagnosis not present

## 2018-07-23 DIAGNOSIS — K648 Other hemorrhoids: Secondary | ICD-10-CM | POA: Diagnosis not present

## 2018-07-23 DIAGNOSIS — D124 Benign neoplasm of descending colon: Secondary | ICD-10-CM | POA: Diagnosis not present

## 2018-07-23 DIAGNOSIS — K635 Polyp of colon: Secondary | ICD-10-CM | POA: Diagnosis not present

## 2018-07-23 DIAGNOSIS — D509 Iron deficiency anemia, unspecified: Secondary | ICD-10-CM | POA: Diagnosis not present

## 2018-07-23 DIAGNOSIS — K2211 Ulcer of esophagus with bleeding: Secondary | ICD-10-CM | POA: Diagnosis not present

## 2018-07-23 DIAGNOSIS — I10 Essential (primary) hypertension: Secondary | ICD-10-CM | POA: Diagnosis not present

## 2018-07-23 DIAGNOSIS — M1990 Unspecified osteoarthritis, unspecified site: Secondary | ICD-10-CM | POA: Diagnosis not present

## 2018-07-23 DIAGNOSIS — D126 Benign neoplasm of colon, unspecified: Secondary | ICD-10-CM | POA: Diagnosis not present

## 2018-07-23 DIAGNOSIS — D649 Anemia, unspecified: Secondary | ICD-10-CM | POA: Diagnosis not present

## 2018-07-23 DIAGNOSIS — D473 Essential (hemorrhagic) thrombocythemia: Secondary | ICD-10-CM | POA: Diagnosis not present

## 2018-07-23 DIAGNOSIS — R195 Other fecal abnormalities: Secondary | ICD-10-CM | POA: Diagnosis not present

## 2018-07-23 DIAGNOSIS — K222 Esophageal obstruction: Secondary | ICD-10-CM | POA: Diagnosis not present

## 2018-07-23 DIAGNOSIS — K921 Melena: Secondary | ICD-10-CM | POA: Diagnosis not present

## 2018-08-05 DIAGNOSIS — M545 Low back pain: Secondary | ICD-10-CM | POA: Diagnosis not present

## 2018-08-05 DIAGNOSIS — I10 Essential (primary) hypertension: Secondary | ICD-10-CM | POA: Diagnosis not present

## 2018-08-05 DIAGNOSIS — M47816 Spondylosis without myelopathy or radiculopathy, lumbar region: Secondary | ICD-10-CM | POA: Diagnosis not present

## 2018-08-05 DIAGNOSIS — Z6826 Body mass index (BMI) 26.0-26.9, adult: Secondary | ICD-10-CM | POA: Diagnosis not present

## 2018-08-25 DIAGNOSIS — C519 Malignant neoplasm of vulva, unspecified: Secondary | ICD-10-CM | POA: Diagnosis not present

## 2018-08-25 DIAGNOSIS — R0600 Dyspnea, unspecified: Secondary | ICD-10-CM | POA: Diagnosis not present

## 2018-08-25 DIAGNOSIS — D473 Essential (hemorrhagic) thrombocythemia: Secondary | ICD-10-CM | POA: Diagnosis not present

## 2018-08-25 DIAGNOSIS — R600 Localized edema: Secondary | ICD-10-CM | POA: Diagnosis not present

## 2018-08-25 DIAGNOSIS — R079 Chest pain, unspecified: Secondary | ICD-10-CM | POA: Diagnosis not present

## 2018-08-25 DIAGNOSIS — R0602 Shortness of breath: Secondary | ICD-10-CM | POA: Diagnosis not present

## 2018-08-25 DIAGNOSIS — C4372 Malignant melanoma of left lower limb, including hip: Secondary | ICD-10-CM | POA: Diagnosis not present

## 2018-08-25 DIAGNOSIS — D649 Anemia, unspecified: Secondary | ICD-10-CM | POA: Diagnosis not present

## 2018-09-03 DIAGNOSIS — L570 Actinic keratosis: Secondary | ICD-10-CM | POA: Diagnosis not present

## 2018-09-03 DIAGNOSIS — D0439 Carcinoma in situ of skin of other parts of face: Secondary | ICD-10-CM | POA: Diagnosis not present

## 2018-09-03 DIAGNOSIS — C441291 Squamous cell carcinoma of skin of left upper eyelid, including canthus: Secondary | ICD-10-CM | POA: Diagnosis not present

## 2018-09-23 DIAGNOSIS — R1310 Dysphagia, unspecified: Secondary | ICD-10-CM | POA: Diagnosis not present

## 2018-09-23 DIAGNOSIS — K222 Esophageal obstruction: Secondary | ICD-10-CM | POA: Diagnosis not present

## 2018-09-23 DIAGNOSIS — D509 Iron deficiency anemia, unspecified: Secondary | ICD-10-CM | POA: Diagnosis not present

## 2018-09-24 DIAGNOSIS — Z1231 Encounter for screening mammogram for malignant neoplasm of breast: Secondary | ICD-10-CM | POA: Diagnosis not present

## 2018-09-24 DIAGNOSIS — M545 Low back pain: Secondary | ICD-10-CM | POA: Diagnosis not present

## 2018-09-24 DIAGNOSIS — E669 Obesity, unspecified: Secondary | ICD-10-CM | POA: Diagnosis not present

## 2018-09-24 DIAGNOSIS — I708 Atherosclerosis of other arteries: Secondary | ICD-10-CM | POA: Diagnosis not present

## 2018-09-24 DIAGNOSIS — N959 Unspecified menopausal and perimenopausal disorder: Secondary | ICD-10-CM | POA: Diagnosis not present

## 2018-09-24 DIAGNOSIS — J449 Chronic obstructive pulmonary disease, unspecified: Secondary | ICD-10-CM | POA: Diagnosis not present

## 2018-09-24 DIAGNOSIS — Z6829 Body mass index (BMI) 29.0-29.9, adult: Secondary | ICD-10-CM | POA: Diagnosis not present

## 2018-09-24 DIAGNOSIS — R911 Solitary pulmonary nodule: Secondary | ICD-10-CM | POA: Diagnosis not present

## 2018-09-24 DIAGNOSIS — Z139 Encounter for screening, unspecified: Secondary | ICD-10-CM | POA: Diagnosis not present

## 2018-09-24 DIAGNOSIS — E785 Hyperlipidemia, unspecified: Secondary | ICD-10-CM | POA: Diagnosis not present

## 2018-09-24 DIAGNOSIS — Z0000 Encounter for general adult medical examination without abnormal findings: Secondary | ICD-10-CM | POA: Diagnosis not present

## 2018-09-24 DIAGNOSIS — Z1211 Encounter for screening for malignant neoplasm of colon: Secondary | ICD-10-CM | POA: Diagnosis not present

## 2018-09-25 DIAGNOSIS — R918 Other nonspecific abnormal finding of lung field: Secondary | ICD-10-CM | POA: Diagnosis not present

## 2018-09-25 DIAGNOSIS — D473 Essential (hemorrhagic) thrombocythemia: Secondary | ICD-10-CM | POA: Diagnosis not present

## 2018-09-28 ENCOUNTER — Ambulatory Visit (INDEPENDENT_AMBULATORY_CARE_PROVIDER_SITE_OTHER): Payer: PPO | Admitting: Cardiology

## 2018-09-28 ENCOUNTER — Encounter: Payer: Self-pay | Admitting: Cardiology

## 2018-09-28 VITALS — BP 136/70 | HR 76 | Ht 65.0 in | Wt 172.0 lb

## 2018-09-28 DIAGNOSIS — I10 Essential (primary) hypertension: Secondary | ICD-10-CM

## 2018-09-28 NOTE — Progress Notes (Signed)
Cardiology Office Note:    Date:  09/28/2018   ID:  Yolanda Flowers, DOB 07-01-1941, MRN [id number]  PCP:  [name], MD  Cardiologist:  Jenean Lindau, MD   Referring MD: [name], MD    ASSESSMENT:    1. Essential hypertension    PLAN:    In order of problems listed above:  1. Primary prevention stressed with the patient.  Importance of compliance with diet and medication stressed and she vocalized understanding. 2.  Her blood pressure is stable and stress test testing done at [facility] was unremarkable and I discussed this with the patient at length.  Diet was discussed for dyslipidemia.  She will be seen in follow-up appointment on a as needed basis only.    Medication Adjustments/Labs and Tests Ordered: Current medicines are reviewed at length with the patient today.  Concerns regarding medicines are outlined above.  No orders of the defined types were placed in this encounter.  No orders of the defined types were placed in this encounter.    No chief complaint on file.    History of Present Illness:    Yolanda Flowers is a 78 y.o. female.  Patient was evaluated by me for preop risk stratification.  She has history of GI bleeding.  This has resolved according to the patient's history.  She is also followed by gastroenterologist locally.  She denies any problems at this time and takes care of activities of daily living she leads a sedentary lifestyle because of orthopedic issues.  At the time of my evaluation, the patient is alert awake oriented and in no distress.  Past Medical History:  Diagnosis Date  . Anxiety   . Arthritis   . Bilateral edema of lower extremity   . Complication of anesthesia    difficulty waking  . COPD (chronic obstructive pulmonary disease) ([place])   . Dyspnea on exertion   . GERD (gastroesophageal reflux disease)   . History of melanoma excision    left greast toe 2015  . History of squamous cell carcinoma excision    face--   multiple excisions  . History of subdural hematoma    2008  . Hypertension   . OSA (obstructive sleep apnea)    intolerant  . Thrombocytosis ([place])    takes hydoxyurea--  MONITORED BY DR Hinton Rao [facility])  . VIN III (vulvar intraepithelial neoplasia III)   . Wears dentures    UPPER AND LOWER PARTIAL  . Wears glasses     Past Surgical History:  Procedure Laterality Date  . ABDOMINAL HYSTERECTOMY  age 89  . KNEE ARTHROSCOPY Left 2004  . MELANOMA EXCISION  2015   left great toe  . REPAIR PERONEAL TENDONS ANKLE  2004  . SUBDURAL HEMATOMA EVACUATION VIA CRANIOTOMY  2008      week later  post-op  [facility] Surgery  . VULVECTOMY N/A 10/24/2015   Procedure: WIDE LOCAL EXCISION OF THE VULVA ;  Surgeon: Everitt Amber, MD;  Location: [place];  Service: Gynecology;  Laterality: N/A;    Current Medications: Current Meds  Medication Sig  . aspirin EC 81 MG tablet Take 81 mg by mouth.  . baclofen (LIORESAL) 10 MG tablet Take 10 mg by mouth 2 (two) times daily.  . Calcium Carb-Cholecalciferol (CALCIUM 600 + D PO) Take 2 tablets by mouth daily.  . ferrous sulfate 325 (65 FE) MG EC tablet Take 325 mg by mouth daily with breakfast.  .  fluticasone (FLONASE) 50 MCG/ACT nasal spray Place 2 sprays into the nose every evening.   Marland Kitchen HYDROcodone-acetaminophen (NORCO/VICODIN) 5-325 MG tablet Take 1 tablet by mouth as needed.  . hydroxyurea (HYDREA) 500 MG capsule Take 500-1,000 mg by mouth 2 (two) times daily. Takes 2 tab every am and 1 tab every pm--  STARTING Thursday 10-12-2015 PT CUT DOWN TO 1 TAB IN AM AND PM PRIOR TO SURGERY 10-24-2015  PER DR ROSSI AND DR Hinton Rao ORDERS  . lansoprazole (PREVACID) 30 MG capsule Take 30 mg by mouth every morning.   Marland Kitchen lisinopril (PRINIVIL,ZESTRIL) 20 MG tablet Take 15 mg by mouth every evening.   Marland Kitchen oxyCODONE-acetaminophen (PERCOCET/ROXICET) 5-325 MG tablet Take 1-2 tablets by mouth every 4 (four) hours as needed for severe pain.  .  rosuvastatin (CRESTOR) 10 MG tablet Take 5 mg by mouth daily.     Allergies:   Cephalexin and Penicillins   Social History   Socioeconomic History  . Marital status: Married    Spouse name: Not on file  . Number of children: Not on file  . Years of education: Not on file  . Highest education level: Not on file  Occupational History  . Not on file  Social Needs  . Financial resource strain: Not on file  . Food insecurity:    Worry: Not on file    Inability: Not on file  . Transportation needs:    Medical: Not on file    Non-medical: Not on file  Tobacco Use  . Smoking status: Never Smoker  . Smokeless tobacco: Never Used  Substance and Sexual Activity  . Alcohol use: No  . Drug use: No  . Sexual activity: Not on file  Lifestyle  . Physical activity:    Days per week: Not on file    Minutes per session: Not on file  . Stress: Not on file  Relationships  . Social connections:    Talks on phone: Not on file    Gets together: Not on file    Attends religious service: Not on file    Active member of club or organization: Not on file    Attends meetings of clubs or organizations: Not on file    Relationship status: Not on file  Other Topics Concern  . Not on file  Social History Narrative  . Not on file     Family History: The patient's family history includes Clotting disorder in her mother; Stroke in her father.  ROS:   Please see the history of present illness.    All other systems reviewed and are negative.  EKGs/Labs/Other Studies Reviewed:    The following studies were reviewed today: I discussed my findings with the patient at extensive length.   Recent Labs: No results found for requested labs within last 8760 hours.  Recent Lipid Panel No results found for: CHOL, TRIG, HDL, CHOLHDL, VLDL, LDLCALC, LDLDIRECT  Physical Exam:    VS:  BP 136/70 (BP Location: Right Arm, Patient Position: Sitting, Cuff Size: Normal)   Pulse 76   Ht 5\' 5"  (1.651 m)    Wt 172 lb (78 kg)   SpO2 95%   BMI 28.62 kg/m     Wt Readings from Last 3 Encounters:  09/28/18 172 lb (78 kg)  06/23/18 164 lb 12.8 oz (74.8 kg)  11/27/15 171 lb 6.4 oz (77.7 kg)     GEN: Patient is in no acute distress HEENT: Normal NECK: No JVD; No carotid bruits LYMPHATICS: No lymphadenopathy  CARDIAC: Hear sounds regular, 2/6 systolic murmur at the apex. RESPIRATORY:  Clear to auscultation without rales, wheezing or rhonchi  ABDOMEN: Soft, non-tender, non-distended MUSCULOSKELETAL:  No edema; No deformity  SKIN: Warm and dry NEUROLOGIC:  Alert and oriented x 3 PSYCHIATRIC:  Normal affect   Signed, Jenean Lindau, MD  09/28/2018 9:52 AM     [facility]

## 2018-09-28 NOTE — Patient Instructions (Signed)
Medication Instructions:  Your physician recommends that you continue on your current medications as directed. Please refer to the Current Medication list given to you today.  If you need a refill on your cardiac medications before your next appointment, please call your pharmacy.   Lab work: None  If you have labs (blood work) drawn today and your tests are completely normal, you will receive your results only by: Marland Kitchen MyChart Message (if you have MyChart) OR . A paper copy in the mail If you have any lab test that is abnormal or we need to change your treatment, we will call you to review the results.  Testing/Procedures: None  Follow-Up: At [facility], you and your health needs are our priority.  As part of our continuing mission to provide you with exceptional heart care, we have created designated Provider Care Teams.  These Care Teams include your primary Cardiologist (physician) and Advanced Practice Providers (APPs -  Physician Assistants and Nurse Practitioners) who all work together to provide you with the care you need, when you need it. You will need a follow up appointment: As needed.  Please call our office 2 months in advance to schedule this appointment.  You may see No primary care provider on file.or another member of our [employer] Provider Team in [place]: Jenne Campus, MD . Shirlee More, MD  Any Other Special Instructions Will Be Listed Below (If Applicable).

## 2018-10-13 DIAGNOSIS — C44629 Squamous cell carcinoma of skin of left upper limb, including shoulder: Secondary | ICD-10-CM | POA: Diagnosis not present

## 2018-10-13 DIAGNOSIS — L570 Actinic keratosis: Secondary | ICD-10-CM | POA: Diagnosis not present

## 2018-10-21 DIAGNOSIS — Z01419 Encounter for gynecological examination (general) (routine) without abnormal findings: Secondary | ICD-10-CM | POA: Diagnosis not present

## 2018-10-21 DIAGNOSIS — Z90710 Acquired absence of both cervix and uterus: Secondary | ICD-10-CM | POA: Diagnosis not present

## 2018-10-21 DIAGNOSIS — Z1239 Encounter for other screening for malignant neoplasm of breast: Secondary | ICD-10-CM | POA: Diagnosis not present

## 2018-10-23 DIAGNOSIS — D473 Essential (hemorrhagic) thrombocythemia: Secondary | ICD-10-CM | POA: Diagnosis not present

## 2018-10-23 DIAGNOSIS — Z8544 Personal history of malignant neoplasm of other female genital organs: Secondary | ICD-10-CM | POA: Diagnosis not present

## 2018-10-23 DIAGNOSIS — Z85820 Personal history of malignant melanoma of skin: Secondary | ICD-10-CM | POA: Diagnosis not present

## 2018-10-26 DIAGNOSIS — M47816 Spondylosis without myelopathy or radiculopathy, lumbar region: Secondary | ICD-10-CM | POA: Diagnosis not present

## 2018-10-26 DIAGNOSIS — I10 Essential (primary) hypertension: Secondary | ICD-10-CM | POA: Diagnosis not present

## 2018-10-26 DIAGNOSIS — Z6827 Body mass index (BMI) 27.0-27.9, adult: Secondary | ICD-10-CM | POA: Diagnosis not present

## 2018-10-26 DIAGNOSIS — M545 Low back pain: Secondary | ICD-10-CM | POA: Diagnosis not present

## 2018-10-28 ENCOUNTER — Institutional Professional Consult (permissible substitution): Payer: PPO | Admitting: Pulmonary Disease

## 2018-11-04 ENCOUNTER — Encounter: Payer: Self-pay | Admitting: Pulmonary Disease

## 2018-11-04 ENCOUNTER — Other Ambulatory Visit: Payer: Self-pay

## 2018-11-04 ENCOUNTER — Ambulatory Visit: Payer: PPO | Admitting: Pulmonary Disease

## 2018-11-04 VITALS — BP 150/76 | HR 76 | Ht 65.0 in | Wt 172.8 lb

## 2018-11-04 DIAGNOSIS — R0602 Shortness of breath: Secondary | ICD-10-CM | POA: Diagnosis not present

## 2018-11-04 DIAGNOSIS — R0609 Other forms of dyspnea: Secondary | ICD-10-CM

## 2018-11-04 DIAGNOSIS — R0600 Dyspnea, unspecified: Secondary | ICD-10-CM

## 2018-11-04 MED ORDER — BUDESONIDE-FORMOTEROL FUMARATE 80-4.5 MCG/ACT IN AERO: 2.0000 | INHALATION_SPRAY | Freq: Two times a day (BID) | RESPIRATORY_TRACT | 0 refills | Status: DC

## 2018-11-04 MED ORDER — AEROCHAMBER MV MISC: 0 refills | Status: DC

## 2018-11-04 NOTE — Progress Notes (Signed)
Synopsis: Referred in March 2020 for SOB/DOE by [name], MD  Subjective:   [name] GENDER: female DOB: 1941-02-26, MRN: [id number]  Chief Complaint  Patient presents with  . Pulmonary Consult    SOB with exertion    Past medical history of essential thrombocytosis on hydroxyurea.  C/o ongoing SOB and DOE. She states that she has had SOB and DOE with significant exertion. She tried mowing this past week and couldn't. She has trouble walking. Lifelong non-smoker. Lived with smokers, all her life and then married a smoker and her husband quit smoking 4 years into their marriage. They were married 34 years, husband passed 7 years ago. She recently lost her dog too. She has symptoms primarily with exertion.  She does notice wheezing on occasion.  She does state that albuterol has made her symptoms better at times.  She was placed on Symbicort in the past but she only used it a couple of times and said that she did not see any difference and so she did not continue it but she only used it 2 or 3 times.  She is limited somewhat in her mobility due to her back pain.  She tries to manage this just by symptom control and not overdoing it.  Patient denies fevers chills night sweats weight loss.  She does occasionally have a wheeze.   Past Medical History:  Diagnosis Date  . Anxiety   . Arthritis   . Bilateral edema of lower extremity   . Complication of anesthesia    difficulty waking  . COPD (chronic obstructive pulmonary disease) ([place])   . Dyspnea on exertion   . GERD (gastroesophageal reflux disease)   . History of melanoma excision    left greast toe 2015  . History of squamous cell carcinoma excision    face--  multiple excisions  . History of subdural hematoma    2008  . Hypertension   . OSA (obstructive sleep apnea)    intolerant  . Thrombocytosis ([place])    takes hydoxyurea--  MONITORED BY DR Hinton Rao [facility])  . VIN III (vulvar intraepithelial  neoplasia III)   . Wears dentures    UPPER AND LOWER PARTIAL  . Wears glasses      Family History  Problem Relation Age of Onset  . Clotting disorder Mother   . Stroke Father      Past Surgical History:  Procedure Laterality Date  . ABDOMINAL HYSTERECTOMY  age 38  . KNEE ARTHROSCOPY Left 2004  . MELANOMA EXCISION  2015   left great toe  . REPAIR PERONEAL TENDONS ANKLE  2004  . SUBDURAL HEMATOMA EVACUATION VIA CRANIOTOMY  2008      week later  post-op  [facility] Surgery  . VULVECTOMY N/A 10/24/2015   Procedure: WIDE LOCAL EXCISION OF THE VULVA ;  Surgeon: Everitt Amber, MD;  Location: [place];  Service: Gynecology;  Laterality: N/A;    Social History   Socioeconomic History  . Marital status: Married    Spouse name: Not on file  . Number of children: Not on file  . Years of education: Not on file  . Highest education level: Not on file  Occupational History  . Not on file  Social Needs  . Financial resource strain: Not on file  . Food insecurity:    Worry: Not on file    Inability: Not on file  . Transportation needs:    Medical: Not on  file    Non-medical: Not on file  Tobacco Use  . Smoking status: Never Smoker  . Smokeless tobacco: Never Used  Substance and Sexual Activity  . Alcohol use: No  . Drug use: No  . Sexual activity: Not on file  Lifestyle  . Physical activity:    Days per week: Not on file    Minutes per session: Not on file  . Stress: Not on file  Relationships  . Social connections:    Talks on phone: Not on file    Gets together: Not on file    Attends religious service: Not on file    Active member of club or organization: Not on file    Attends meetings of clubs or organizations: Not on file    Relationship status: Not on file  . Intimate partner violence:    Fear of current or ex partner: Not on file    Emotionally abused: Not on file    Physically abused: Not on file    Forced sexual activity: Not on file  Other  Topics Concern  . Not on file  Social History Narrative  . Not on file     Allergies  Allergen Reactions  . Cephalexin Hives  . Penicillins Rash    Childhood reaction     Outpatient Medications Prior to Visit  Medication Sig Dispense Refill  . albuterol (PROVENTIL HFA;VENTOLIN HFA) 108 (90 Base) MCG/ACT inhaler Inhale into the lungs every 6 (six) hours as needed for wheezing or shortness of breath.    Marland Kitchen aspirin EC 81 MG tablet Take 81 mg by mouth.    . budesonide-formoterol (SYMBICORT) 160-4.5 MCG/ACT inhaler Inhale 2 puffs into the lungs 2 (two) times daily.    . Calcium Carb-Cholecalciferol (CALCIUM 600 + D PO) Take 2 tablets by mouth daily.    . cholecalciferol (VITAMIN D3) 25 MCG (1000 UT) tablet Take 1,000 Units by mouth daily.    . ferrous sulfate 325 (65 FE) MG EC tablet Take 325 mg by mouth daily with breakfast.    . fluticasone (FLONASE) 50 MCG/ACT nasal spray Place 2 sprays into the nose every evening.     Marland Kitchen HYDROcodone-acetaminophen (NORCO/VICODIN) 5-325 MG tablet Take 1 tablet by mouth as needed.    . hydroxyurea (HYDREA) 500 MG capsule Take 500-1,000 mg by mouth 2 (two) times daily. Takes 2 tab every am and 1 tab every pm--  STARTING Thursday 10-12-2015 PT CUT DOWN TO 1 TAB IN AM AND PM PRIOR TO SURGERY 10-24-2015  PER DR ROSSI AND DR Hinton Rao ORDERS    . lansoprazole (PREVACID) 30 MG capsule Take 30 mg by mouth every morning.     Marland Kitchen lisinopril (PRINIVIL,ZESTRIL) 20 MG tablet Take 15 mg by mouth every evening.     . Magnesium 250 MG TABS Take by mouth.    . oxyCODONE-acetaminophen (PERCOCET/ROXICET) 5-325 MG tablet Take 1-2 tablets by mouth every 4 (four) hours as needed for severe pain. 30 tablet 0  . rosuvastatin (CRESTOR) 10 MG tablet Take 5 mg by mouth daily.    . baclofen (LIORESAL) 10 MG tablet Take 10 mg by mouth 2 (two) times daily.     No facility-administered medications prior to visit.     Review of Systems  Constitutional: Negative for chills, fever,  malaise/fatigue and weight loss.  HENT: Negative for hearing loss, sore throat and tinnitus.   Eyes: Negative for blurred vision and double vision.  Respiratory: Positive for shortness of breath and wheezing. Negative for  cough, hemoptysis, sputum production and stridor.   Cardiovascular: Negative for chest pain, palpitations, orthopnea, leg swelling and PND.  Gastrointestinal: Negative for abdominal pain, constipation, diarrhea, heartburn, nausea and vomiting.  Genitourinary: Negative for dysuria, hematuria and urgency.  Musculoskeletal: Negative for joint pain and myalgias.  Skin: Negative for itching and rash.  Neurological: Negative for dizziness, tingling, weakness and headaches.  Endo/Heme/Allergies: Negative for environmental allergies. Does not bruise/bleed easily.  Psychiatric/Behavioral: Negative for depression. The patient is not nervous/anxious and does not have insomnia.   All other systems reviewed and are negative.    Objective:  Physical Exam Vitals signs reviewed.  Constitutional:      General: She is not in acute distress.    Appearance: She is well-developed.  HENT:     Head: Normocephalic and atraumatic.  Eyes:     General: No scleral icterus.    Conjunctiva/sclera: Conjunctivae normal.     Pupils: Pupils are equal, round, and reactive to light.  Neck:     Musculoskeletal: Neck supple.     Vascular: No JVD.     Trachea: No tracheal deviation.  Cardiovascular:     Rate and Rhythm: Normal rate and regular rhythm.     Heart sounds: Normal heart sounds. No murmur.  Pulmonary:     Effort: Pulmonary effort is normal. No tachypnea, accessory muscle usage or respiratory distress.     Breath sounds: Normal breath sounds. No stridor. No wheezing, rhonchi or rales.  Abdominal:     General: Bowel sounds are normal. There is no distension.     Palpations: Abdomen is soft.     Tenderness: There is no abdominal tenderness.  Musculoskeletal:        General: No  tenderness.     Right lower leg: Edema present.     Left lower leg: Edema present.  Lymphadenopathy:     Cervical: No cervical adenopathy.  Skin:    General: Skin is warm and dry.     Capillary Refill: Capillary refill takes less than 2 seconds.     Findings: No rash.     Comments: Areas of redness and depigmentation following treatments for skin cancers on the face chest and arms.  Neurological:     Mental Status: She is alert and oriented to person, place, and time.  Psychiatric:        Behavior: Behavior normal.     Vitals:   11/04/18 1046  BP: (!) 150/76  Pulse: 76  SpO2: 98%  Weight: 172 lb 12.8 oz (78.4 kg)  Height: 5\' 5"  (1.651 m)   98% on RA BMI Readings from Last 3 Encounters:  11/04/18 28.76 kg/m  09/28/18 28.62 kg/m  06/23/18 27.42 kg/m   Wt Readings from Last 3 Encounters:  11/04/18 172 lb 12.8 oz (78.4 kg)  09/28/18 172 lb (78 kg)  06/23/18 164 lb 12.8 oz (74.8 kg)    CBC    Component Value Date/Time   HGB 13.3 10/24/2015 0837   HCT 39.0 10/24/2015 0837    Chest Imaging: States that a CT of the chest was completed that revealed a small lung nodule from her oncology office.  I do not have these images to review myself.  But she does state that she has follow-up planned with her oncologist regarding this.  Pulmonary Functions Testing Results: No flowsheet data found.   FeNO: None   Pathology: None   Echocardiogram: None   Heart Catheterization: None     Assessment & Plan:   SOB (  shortness of breath) - Plan: Pulmonary Function Test  DOE (dyspnea on exertion) - Plan: Pulmonary Function Test   Discussion:  This is a 78 year old female with complaints of shortness of breath and dyspnea on exertion.  She has some symptoms that may be consistent with ongoing asthma.  She was given Symbicort inhaler in the past.  She did not use this for many days.  She was not sure if it made much difference in her symptoms.  She does state that albuterol has  made improvements in symptoms.  She does have seasonal allergies.  She does take an antihistamine.  I encouraged her to continue to take it daily antiallergy medications.  Especially during the springtime.  We will restart her on her Symbicort inhaler.  I encouraged her to use this regularly for the next coming weeks.  We will also obtain full pulmonary function test on return to the office. We also need to obtain a report of the CT scan that was completed from her oncology office.  She was only told about the lung nodule and not told of any interstitial disease presents.  Patient to return to clinic in 4 weeks with full pulmonary function test and follow-up with Wyn Quaker, NP.  Greater than 50% of this patient's 45-minute office visit was spent face-to-face discussing above recommendations and treatment plan.   Current Outpatient Medications:  .  albuterol (PROVENTIL HFA;VENTOLIN HFA) 108 (90 Base) MCG/ACT inhaler, Inhale into the lungs every 6 (six) hours as needed for wheezing or shortness of breath., Disp: , Rfl:  .  aspirin EC 81 MG tablet, Take 81 mg by mouth., Disp: , Rfl:  .  budesonide-formoterol (SYMBICORT) 160-4.5 MCG/ACT inhaler, Inhale 2 puffs into the lungs 2 (two) times daily., Disp: , Rfl:  .  Calcium Carb-Cholecalciferol (CALCIUM 600 + D PO), Take 2 tablets by mouth daily., Disp: , Rfl:  .  cholecalciferol (VITAMIN D3) 25 MCG (1000 UT) tablet, Take 1,000 Units by mouth daily., Disp: , Rfl:  .  ferrous sulfate 325 (65 FE) MG EC tablet, Take 325 mg by mouth daily with breakfast., Disp: , Rfl:  .  fluticasone (FLONASE) 50 MCG/ACT nasal spray, Place 2 sprays into the nose every evening. , Disp: , Rfl:  .  HYDROcodone-acetaminophen (NORCO/VICODIN) 5-325 MG tablet, Take 1 tablet by mouth as needed., Disp: , Rfl:  .  hydroxyurea (HYDREA) 500 MG capsule, Take 500-1,000 mg by mouth 2 (two) times daily. Takes 2 tab every am and 1 tab every pm--  STARTING Thursday 10-12-2015 PT CUT DOWN  TO 1 TAB IN AM AND PM PRIOR TO SURGERY 10-24-2015  PER DR ROSSI AND DR Hinton Rao ORDERS, Disp: , Rfl:  .  lansoprazole (PREVACID) 30 MG capsule, Take 30 mg by mouth every morning. , Disp: , Rfl:  .  lisinopril (PRINIVIL,ZESTRIL) 20 MG tablet, Take 15 mg by mouth every evening. , Disp: , Rfl:  .  Magnesium 250 MG TABS, Take by mouth., Disp: , Rfl:  .  oxyCODONE-acetaminophen (PERCOCET/ROXICET) 5-325 MG tablet, Take 1-2 tablets by mouth every 4 (four) hours as needed for severe pain., Disp: 30 tablet, Rfl: 0 .  rosuvastatin (CRESTOR) 10 MG tablet, Take 5 mg by mouth daily., Disp: , Rfl:    Garner Nash, DO [facility] 11/04/2018 11:14 AM

## 2018-11-04 NOTE — Patient Instructions (Addendum)
Thank you for visiting Dr. Valeta Harms at [facility]. Today we recommend the following:  Orders Placed This Encounter  Procedures  . Pulmonary Function Test   Will restart you on Symbicort 80, 2 puffs twice daily with a spacer. Continue the use of your albuterol for shortness of breath and wheezing. We will have pulmonary function test completed at the time of your next office visit.  Return in about 1 month (around 12/05/2018).  Return appointment to be scheduled with Wyn Quaker, NP.

## 2018-11-24 DIAGNOSIS — C4441 Basal cell carcinoma of skin of scalp and neck: Secondary | ICD-10-CM | POA: Diagnosis not present

## 2018-11-24 DIAGNOSIS — R9389 Abnormal findings on diagnostic imaging of other specified body structures: Secondary | ICD-10-CM | POA: Diagnosis not present

## 2018-11-24 DIAGNOSIS — Z01810 Encounter for preprocedural cardiovascular examination: Secondary | ICD-10-CM | POA: Diagnosis not present

## 2018-11-24 DIAGNOSIS — E538 Deficiency of other specified B group vitamins: Secondary | ICD-10-CM | POA: Diagnosis not present

## 2018-11-24 DIAGNOSIS — E559 Vitamin D deficiency, unspecified: Secondary | ICD-10-CM | POA: Diagnosis not present

## 2018-11-24 DIAGNOSIS — Z20828 Contact with and (suspected) exposure to other viral communicable diseases: Secondary | ICD-10-CM | POA: Diagnosis not present

## 2018-11-24 DIAGNOSIS — Z01812 Encounter for preprocedural laboratory examination: Secondary | ICD-10-CM | POA: Diagnosis not present

## 2018-11-24 DIAGNOSIS — S72001G Fracture of unspecified part of neck of right femur, subsequent encounter for closed fracture with delayed healing: Secondary | ICD-10-CM | POA: Diagnosis not present

## 2018-11-24 DIAGNOSIS — L570 Actinic keratosis: Secondary | ICD-10-CM | POA: Diagnosis not present

## 2018-11-24 DIAGNOSIS — D0439 Carcinoma in situ of skin of other parts of face: Secondary | ICD-10-CM | POA: Diagnosis not present

## 2018-11-24 DIAGNOSIS — E611 Iron deficiency: Secondary | ICD-10-CM | POA: Diagnosis not present

## 2018-11-24 DIAGNOSIS — Z0000 Encounter for general adult medical examination without abnormal findings: Secondary | ICD-10-CM | POA: Diagnosis not present

## 2018-11-24 DIAGNOSIS — G4700 Insomnia, unspecified: Secondary | ICD-10-CM | POA: Diagnosis not present

## 2018-11-25 DIAGNOSIS — Z6827 Body mass index (BMI) 27.0-27.9, adult: Secondary | ICD-10-CM | POA: Diagnosis not present

## 2018-11-25 DIAGNOSIS — I351 Nonrheumatic aortic (valve) insufficiency: Secondary | ICD-10-CM | POA: Diagnosis not present

## 2018-11-25 DIAGNOSIS — I493 Ventricular premature depolarization: Secondary | ICD-10-CM | POA: Diagnosis not present

## 2018-11-25 DIAGNOSIS — I2510 Atherosclerotic heart disease of native coronary artery without angina pectoris: Secondary | ICD-10-CM | POA: Diagnosis not present

## 2018-11-25 DIAGNOSIS — I2584 Coronary atherosclerosis due to calcified coronary lesion: Secondary | ICD-10-CM | POA: Diagnosis not present

## 2018-11-25 DIAGNOSIS — I340 Nonrheumatic mitral (valve) insufficiency: Secondary | ICD-10-CM | POA: Diagnosis not present

## 2018-11-25 DIAGNOSIS — M545 Low back pain: Secondary | ICD-10-CM | POA: Diagnosis not present

## 2018-11-25 DIAGNOSIS — I480 Paroxysmal atrial fibrillation: Secondary | ICD-10-CM | POA: Diagnosis not present

## 2018-12-11 ENCOUNTER — Ambulatory Visit: Payer: PPO | Admitting: Pulmonary Disease

## 2019-01-12 DIAGNOSIS — M47816 Spondylosis without myelopathy or radiculopathy, lumbar region: Secondary | ICD-10-CM | POA: Diagnosis not present

## 2019-01-12 DIAGNOSIS — M4120 Other idiopathic scoliosis, site unspecified: Secondary | ICD-10-CM | POA: Diagnosis not present

## 2019-01-16 DIAGNOSIS — D485 Neoplasm of uncertain behavior of skin: Secondary | ICD-10-CM | POA: Diagnosis not present

## 2019-02-03 ENCOUNTER — Other Ambulatory Visit: Payer: Self-pay | Admitting: Pulmonary Disease

## 2019-02-05 ENCOUNTER — Other Ambulatory Visit (HOSPITAL_COMMUNITY)
Admission: RE | Admit: 2019-02-05 | Discharge: 2019-02-05 | Disposition: A | Discharge status: Home / Self Care | Payer: PPO | Source: Ambulatory Visit | Attending: Pulmonary Disease | Admitting: Pulmonary Disease

## 2019-02-05 DIAGNOSIS — Z1159 Encounter for screening for other viral diseases: Secondary | ICD-10-CM | POA: Diagnosis not present

## 2019-02-05 LAB — SARS CORONAVIRUS 2 (TAT 6-24 HRS): SARS Coronavirus 2: NEGATIVE

## 2019-02-09 ENCOUNTER — Ambulatory Visit (INDEPENDENT_AMBULATORY_CARE_PROVIDER_SITE_OTHER): Payer: PPO | Admitting: Primary Care

## 2019-02-09 ENCOUNTER — Ambulatory Visit (INDEPENDENT_AMBULATORY_CARE_PROVIDER_SITE_OTHER): Payer: PPO | Admitting: Pulmonary Disease

## 2019-02-09 ENCOUNTER — Other Ambulatory Visit: Payer: Self-pay

## 2019-02-09 ENCOUNTER — Encounter: Payer: Self-pay | Admitting: Primary Care

## 2019-02-09 VITALS — BP 124/86 | HR 82 | Temp 98.5°F | Ht 62.25 in | Wt 173.4 lb

## 2019-02-09 DIAGNOSIS — D72829 Elevated white blood cell count, unspecified: Secondary | ICD-10-CM

## 2019-02-09 DIAGNOSIS — R0609 Other forms of dyspnea: Secondary | ICD-10-CM

## 2019-02-09 DIAGNOSIS — R0600 Dyspnea, unspecified: Secondary | ICD-10-CM

## 2019-02-09 DIAGNOSIS — R911 Solitary pulmonary nodule: Secondary | ICD-10-CM

## 2019-02-09 DIAGNOSIS — R0602 Shortness of breath: Secondary | ICD-10-CM

## 2019-02-09 DIAGNOSIS — R918 Other nonspecific abnormal finding of lung field: Secondary | ICD-10-CM | POA: Diagnosis not present

## 2019-02-09 HISTORY — DX: Dyspnea, unspecified: R06.00

## 2019-02-09 HISTORY — DX: Solitary pulmonary nodule: R91.1

## 2019-02-09 LAB — PULMONARY FUNCTION TEST
DL/VA % pred: 129 %
DL/VA: 5.34 ml/min/mmHg/L
DLCO unc % pred: 128 %
DLCO unc: 22.78 ml/min/mmHg
FEF 25-75 Post: 2.2 L/sec
FEF 25-75 Pre: 1.95 L/sec
FEF2575-%Change-Post: 12 %
FEF2575-%Pred-Post: 156 %
FEF2575-%Pred-Pre: 138 %
FEV1-%Change-Post: 6 %
FEV1-%Pred-Post: 114 %
FEV1-%Pred-Pre: 107 %
FEV1-Post: 2.11 L
FEV1-Pre: 1.98 L
FEV1FVC-%Change-Post: 5 %
FEV1FVC-%Pred-Pre: 104 %
FEV6-%Change-Post: 3 %
FEV6-%Pred-Post: 109 %
FEV6-%Pred-Pre: 105 %
FEV6-Post: 2.56 L
FEV6-Pre: 2.48 L
FEV6FVC-%Pred-Post: 105 %
FEV6FVC-%Pred-Pre: 105 %
FVC-%Change-Post: 0 %
FVC-%Pred-Post: 103 %
FVC-%Pred-Pre: 102 %
FVC-Post: 2.56 L
FVC-Pre: 2.55 L
Post FEV1/FVC ratio: 82 %
Post FEV6/FVC ratio: 100 %
Pre FEV1/FVC ratio: 78 %
Pre FEV6/FVC Ratio: 100 %
RV % pred: 132 %
RV: 2.99 L
TLC % pred: 113 %
TLC: 5.41 L

## 2019-02-09 LAB — BASIC METABOLIC PANEL
BUN: 19 mg/dL (ref 6–23)
CO2: 27 mEq/L (ref 19–32)
Calcium: 9.4 mg/dL (ref 8.4–10.5)
Chloride: 103 mEq/L (ref 96–112)
Creatinine, Ser: 1.16 mg/dL (ref 0.40–1.20)
GFR: 45.12 mL/min — ABNORMAL LOW (ref >60.00)
Glucose, Bld: 94 mg/dL (ref 70–99)
Potassium: 4 mEq/L (ref 3.5–5.1)
Sodium: 139 mEq/L (ref 135–145)

## 2019-02-09 LAB — CBC WITH DIFFERENTIAL/PLATELET
Basophils Absolute: 0.2 10*3/uL — ABNORMAL HIGH (ref 0.0–0.1)
Basophils Relative: 1.4 % (ref 0.0–3.0)
Eosinophils Absolute: 0.2 10*3/uL (ref 0.0–0.7)
Eosinophils Relative: 1.4 % (ref 0.0–5.0)
HCT: 35.6 % — ABNORMAL LOW (ref 36.0–46.0)
Hemoglobin: 11.6 g/dL — ABNORMAL LOW (ref 12.0–15.0)
Lymphocytes Relative: 10.3 % — ABNORMAL LOW (ref 12.0–46.0)
Lymphs Abs: 1.6 10*3/uL (ref 0.7–4.0)
MCHC: 32.7 g/dL (ref 30.0–36.0)
MCV: 106.1 fl — ABNORMAL HIGH (ref 78.0–100.0)
Monocytes Absolute: 1.5 10*3/uL — ABNORMAL HIGH (ref 0.1–1.0)
Monocytes Relative: 9.8 % (ref 3.0–12.0)
Neutro Abs: 12.1 10*3/uL — ABNORMAL HIGH (ref 1.4–7.7)
Neutrophils Relative %: 77.1 % — ABNORMAL HIGH (ref 43.0–77.0)
Platelets: 204 10*3/uL (ref 150.0–400.0)
RBC: 3.35 Mil/uL — ABNORMAL LOW (ref 3.87–5.11)
RDW: 16.7 % — ABNORMAL HIGH (ref 11.5–15.5)
WBC: 15.7 10*3/uL — ABNORMAL HIGH (ref 4.0–10.5)

## 2019-02-09 LAB — BRAIN NATRIURETIC PEPTIDE: Pro B Natriuretic peptide (BNP): 70 pg/mL (ref 0.0–100.0)

## 2019-02-09 MED ORDER — BUDESONIDE-FORMOTEROL FUMARATE 160-4.5 MCG/ACT IN AERO: 2.0000 | INHALATION_SPRAY | Freq: Two times a day (BID) | RESPIRATORY_TRACT | 0 refills | Status: DC

## 2019-02-09 MED ORDER — MONTELUKAST SODIUM 10 MG PO TABS: 10.0000 mg | ORAL_TABLET | Freq: Every day | ORAL | 11 refills | Status: DC

## 2019-02-09 MED ORDER — ALBUTEROL SULFATE HFA 108 (90 BASE) MCG/ACT IN AERS: 2.0000 | INHALATION_SPRAY | Freq: Four times a day (QID) | RESPIRATORY_TRACT | 3 refills | Status: DC | PRN

## 2019-02-09 NOTE — Assessment & Plan Note (Signed)
-   No evidence of obstruction on PFTs

## 2019-02-09 NOTE — Addendum Note (Signed)
Addended by: Suzzanne Cloud E on: 02/09/2019 02:00 PM   Modules accepted: Orders

## 2019-02-09 NOTE — Assessment & Plan Note (Signed)
-   Trial Singulair 10mg  [facility]

## 2019-02-09 NOTE — Progress Notes (Signed)
Full PFT performed today. °

## 2019-02-09 NOTE — Progress Notes (Addendum)
@Patient  ID: Yolanda Flowers, female    DOB: 1941/04/04, 78 y.o.   MRN: [id number]  Chief Complaint  Patient presents with  . Follow-up    f/u with PFT, SOB with exertion, chest tightness    Referring provider: [name], MD  HPI: 78 year old female, never smoked (lived with smokers). PMH significant for HTN, allergic rhinitis, GERD, emphysema. Patient of Dr. Valeta Harms, seen for initial consults on 11/04/18 for dyspnea. CTA Jan 2020 showed 6MM noncalcified nodule anterior RUL. No mention of ILD or emphysema. Some symptoms consistent with asthma. Reports improvement with albuterol. Restarted on Symbicort 80 and encouraged regular use.   02/09/2019 Patient presents today for 3 months follow with PFTs. Still short of breath. Reports shortness of breath for 2-3 year, seems to be getting worse. Unbearable with light activity. Gets winded walking to mailbox and needs to sit down to recover which takes 15 mins. She stopped using her symbicort a few weeks ago because she did not notice a difference while on it. Wearing a bra makes her chest feel tight. Hurts across her chest. Some wheezing at night. Right leg swelling worse than left d/t only injury. She had a TEE in 2019 that showed normal left ventricular size and systolic function. EF 60-65%. Diastolic function appears mildly impaired. Follows with Dr. Geraldo Pitter with cardiology, last seen on 09/28/18. Denies cough.   Significant testing: 02/09/2019 PFTs - FVC 2.56 (103%), FEV1 2.11 (114%), ratio 82, no BD response. Normal DLCO.   05/2018 TEE - Normal left ventricular size and systolic function with no appreciablesegmental abnormality. Ejection fraction is visually estimated at 60-65%. Diastolic function appears mildly impaired. Mild mitral regurgitation. There is mild aortic sclerosis noted, with no evidence of stenosis. Mild tricuspid regurgitation. RVSP 28 mm Hg.  Allergies  Allergen Reactions  . Cephalexin Hives  . Penicillins Rash    Childhood  reaction     There is no immunization history on file for this patient.  Past Medical History:  Diagnosis Date  . Anxiety   . Arthritis   . Bilateral edema of lower extremity   . Complication of anesthesia    difficulty waking  . COPD (chronic obstructive pulmonary disease) ([place])   . Dyspnea on exertion   . GERD (gastroesophageal reflux disease)   . History of melanoma excision    left greast toe 2015  . History of squamous cell carcinoma excision    face--  multiple excisions  . History of subdural hematoma    2008  . Hypertension   . OSA (obstructive sleep apnea)    intolerant  . Thrombocytosis ([place])    takes hydoxyurea--  MONITORED BY DR Hinton Rao [facility])  . VIN III (vulvar intraepithelial neoplasia III)   . Wears dentures    UPPER AND LOWER PARTIAL  . Wears glasses     Tobacco History: Social History   Tobacco Use  Smoking Status Never Smoker  Smokeless Tobacco Never Used   Counseling given: Not Answered   Outpatient Medications Prior to Visit  Medication Sig Dispense Refill  . aspirin EC 81 MG tablet Take 81 mg by mouth.    . budesonide-formoterol (SYMBICORT) 160-4.5 MCG/ACT inhaler Inhale 2 puffs into the lungs 2 (two) times daily.    . budesonide-formoterol (SYMBICORT) 80-4.5 MCG/ACT inhaler Inhale 2 puffs into the lungs 2 (two) times daily. 2 Inhaler 0  . Calcium Carb-Cholecalciferol (CALCIUM 600 + D PO) Take 2 tablets by mouth daily.    . cholecalciferol (VITAMIN  D3) 25 MCG (1000 UT) tablet Take 1,000 Units by mouth daily.    . ferrous sulfate 325 (65 FE) MG EC tablet Take 325 mg by mouth daily with breakfast.    . fluticasone (FLONASE) 50 MCG/ACT nasal spray Place 2 sprays into the nose every evening.     Marland Kitchen HYDROcodone-acetaminophen (NORCO/VICODIN) 5-325 MG tablet Take 1 tablet by mouth as needed.    . hydroxyurea (HYDREA) 500 MG capsule Take 500-1,000 mg by mouth 2 (two) times daily. Takes 2 tab every am and 1 tab every pm--  STARTING  Thursday 10-12-2015 PT CUT DOWN TO 1 TAB IN AM AND PM PRIOR TO SURGERY 10-24-2015  PER DR ROSSI AND DR Hinton Rao ORDERS    . lansoprazole (PREVACID) 30 MG capsule Take 30 mg by mouth every morning.     Marland Kitchen lisinopril (PRINIVIL,ZESTRIL) 20 MG tablet Take 15 mg by mouth every evening.     . Magnesium 250 MG TABS Take by mouth.    . oxyCODONE-acetaminophen (PERCOCET/ROXICET) 5-325 MG tablet Take 1-2 tablets by mouth every 4 (four) hours as needed for severe pain. 30 tablet 0  . rosuvastatin (CRESTOR) 10 MG tablet Take 5 mg by mouth daily.    Marland Kitchen Spacer/Aero-Holding Chambers (AEROCHAMBER MV) inhaler Use as instructed 1 each 0  . albuterol (PROVENTIL HFA;VENTOLIN HFA) 108 (90 Base) MCG/ACT inhaler Inhale into the lungs every 6 (six) hours as needed for wheezing or shortness of breath.     No facility-administered medications prior to visit.    Review of Systems  Review of Systems  Constitutional: Positive for diaphoresis and fatigue.  HENT: Positive for congestion.   Respiratory: Positive for chest tightness, shortness of breath and wheezing. Negative for cough.   Cardiovascular: Positive for leg swelling.   Physical Exam  BP 124/86 (BP Location: Left Arm, Cuff Size: Normal)   Pulse 82   Temp 98.5 F (36.9 C)   Ht 5' 2.25" (1.581 m)   Wt 173 lb 6.4 oz (78.7 kg)   SpO2 95%   BMI 31.46 kg/m  Physical Exam Constitutional:      General: She is not in acute distress.    Appearance: She is not ill-appearing.  Cardiovascular:     Rate and Rhythm: Normal rate and regular rhythm.     Comments: +1-2 RLE edema; trace LLE Pulmonary:     Effort: Pulmonary effort is normal. No respiratory distress.     Breath sounds: No stridor. No wheezing or rhonchi.     Comments: ? crackles bases Skin:    General: Skin is warm and dry.  Neurological:     General: No focal deficit present.     Mental Status: She is alert and oriented to person, place, and time. Mental status is at baseline.  Psychiatric:         Mood and Affect: Mood normal.        Behavior: Behavior normal.        Thought Content: Thought content normal.        Judgment: Judgment normal.      Lab Results:  CBC    Component Value Date/Time   HGB 13.3 10/24/2015 0837   HCT 39.0 10/24/2015 0837    BMET    Component Value Date/Time   NA 142 10/24/2015 0837   K 4.5 10/24/2015 0837   GLUCOSE 88 10/24/2015 0837    BNP No results found for: BNP  ProBNP No results found for: PROBNP  Imaging: No results found.  Assessment & Plan:   COPD (chronic obstructive pulmonary disease) (HCC) - No evidence of obstruction on PFTs  Allergic rhinitis - Trial Singulair 10mg  QHS  Dyspnea - Shortness of breath with light activity, associated chest tightness - History has some clinical features of asthma - PFTs 02/09/2019 showed no evidence of restriction or obstruction. No BD response or diffusion defect.  - No improvement with low dose ICS/LABA. Trial increased Symbicort 160 two puffs twice daily.  - Consider methacholine challenge to r/o asthma  - Checking BNP, CBC, BMET  - Encourage weight loss and increasing physical activity   Solitary pulmonary nodule on lung CT - CTA Jan 2020 showed 6MM noncalcified nodule anterior RUL - Needs 6 month follow-up CT chest wo contrast (due in July)  Follow-up: 2 week televisit with Eustaquio Maize, NP Dr. Valeta Harms in 4 months   Martyn Ehrich, NP 02/09/2019  Addendum: Labs reveled normal BNP. Elevated WBC at 15 with no source of infection identified. Denies cough, sinus symptoms, abdominal pain. Her urine is dark and does have a smell. Will check UA C&S. If no urinary infection recommend repeat labs in 1 week and if still elevated refer back to PCP or hematology to r/o MDS

## 2019-02-09 NOTE — Assessment & Plan Note (Signed)
-   CTA Jan 2020 showed 6MM noncalcified nodule anterior RUL - Needs 6 month follow-up CT chest wo contrast (due in July)

## 2019-02-09 NOTE — Patient Instructions (Addendum)
Pulmonary function testing showed no evidence of chronic obstructive pulmonary disease  You can still have symptoms of asthma, continue to use symbicort twice daily  Trial - increased Symbicort 160- take two puffs twice daily x 2 weeks (sample given)  Orders: - Labs today - CT chest wo contrast in July re: solitary pulm nodule <1cm   Follow-up: - 2 week televisit with Eustaquio Maize, NP - Follow up with Dr. Valeta Harms in 4 months

## 2019-02-09 NOTE — Addendum Note (Signed)
Addended by: Karmen Stabs on: 02/09/2019 04:54 PM   Modules accepted: Orders

## 2019-02-09 NOTE — Assessment & Plan Note (Addendum)
-   Shortness of breath with light activity, associated chest tightness - History has some clinical features of asthma - PFTs 02/09/2019 showed no evidence of restriction or obstruction. No BD response or diffusion defect.  - No improvement with low dose ICS/LABA. Trial increased Symbicort 160 two puffs twice daily.  - Consider methacholine challenge to r/o asthma  - Checking BNP, CBC, BMET  - Encourage weight loss and increasing physical activity

## 2019-02-10 DIAGNOSIS — M545 Low back pain: Secondary | ICD-10-CM | POA: Diagnosis not present

## 2019-02-10 DIAGNOSIS — I10 Essential (primary) hypertension: Secondary | ICD-10-CM | POA: Diagnosis not present

## 2019-02-10 DIAGNOSIS — E559 Vitamin D deficiency, unspecified: Secondary | ICD-10-CM | POA: Diagnosis not present

## 2019-02-10 DIAGNOSIS — E663 Overweight: Secondary | ICD-10-CM | POA: Diagnosis not present

## 2019-02-10 DIAGNOSIS — Z79899 Other long term (current) drug therapy: Secondary | ICD-10-CM | POA: Diagnosis not present

## 2019-02-10 DIAGNOSIS — Z9181 History of falling: Secondary | ICD-10-CM | POA: Diagnosis not present

## 2019-02-10 DIAGNOSIS — E782 Mixed hyperlipidemia: Secondary | ICD-10-CM | POA: Diagnosis not present

## 2019-02-10 DIAGNOSIS — Z6829 Body mass index (BMI) 29.0-29.9, adult: Secondary | ICD-10-CM | POA: Diagnosis not present

## 2019-02-10 DIAGNOSIS — R911 Solitary pulmonary nodule: Secondary | ICD-10-CM | POA: Diagnosis not present

## 2019-02-10 DIAGNOSIS — Z1331 Encounter for screening for depression: Secondary | ICD-10-CM | POA: Diagnosis not present

## 2019-02-10 NOTE — Progress Notes (Signed)
PCCM: Agree with nodule follow up and short term follow up for dyspnea.  Thanks Garner Nash, DO [facility] 02/10/2019 2:22 PM

## 2019-02-12 DIAGNOSIS — R918 Other nonspecific abnormal finding of lung field: Secondary | ICD-10-CM | POA: Diagnosis not present

## 2019-02-12 DIAGNOSIS — E119 Type 2 diabetes mellitus without complications: Secondary | ICD-10-CM | POA: Diagnosis not present

## 2019-02-12 DIAGNOSIS — R0609 Other forms of dyspnea: Secondary | ICD-10-CM | POA: Diagnosis not present

## 2019-02-12 DIAGNOSIS — E538 Deficiency of other specified B group vitamins: Secondary | ICD-10-CM | POA: Diagnosis not present

## 2019-02-12 DIAGNOSIS — Z8544 Personal history of malignant neoplasm of other female genital organs: Secondary | ICD-10-CM | POA: Diagnosis not present

## 2019-02-12 DIAGNOSIS — G8929 Other chronic pain: Secondary | ICD-10-CM | POA: Diagnosis not present

## 2019-02-12 DIAGNOSIS — D649 Anemia, unspecified: Secondary | ICD-10-CM | POA: Diagnosis not present

## 2019-02-12 DIAGNOSIS — M549 Dorsalgia, unspecified: Secondary | ICD-10-CM | POA: Diagnosis not present

## 2019-02-12 DIAGNOSIS — D473 Essential (hemorrhagic) thrombocythemia: Secondary | ICD-10-CM | POA: Diagnosis not present

## 2019-02-12 DIAGNOSIS — C946 Myelodysplastic disease, not classified: Secondary | ICD-10-CM | POA: Diagnosis not present

## 2019-02-12 DIAGNOSIS — E559 Vitamin D deficiency, unspecified: Secondary | ICD-10-CM | POA: Diagnosis not present

## 2019-02-12 DIAGNOSIS — Z85820 Personal history of malignant melanoma of skin: Secondary | ICD-10-CM | POA: Diagnosis not present

## 2019-02-15 DIAGNOSIS — R1310 Dysphagia, unspecified: Secondary | ICD-10-CM | POA: Diagnosis not present

## 2019-02-15 DIAGNOSIS — K222 Esophageal obstruction: Secondary | ICD-10-CM | POA: Diagnosis not present

## 2019-02-15 DIAGNOSIS — M545 Low back pain: Secondary | ICD-10-CM | POA: Diagnosis not present

## 2019-02-15 DIAGNOSIS — K5730 Diverticulosis of large intestine without perforation or abscess without bleeding: Secondary | ICD-10-CM | POA: Diagnosis not present

## 2019-02-17 ENCOUNTER — Ambulatory Visit: Payer: PPO | Admitting: Pulmonary Disease

## 2019-02-22 ENCOUNTER — Ambulatory Visit (INDEPENDENT_AMBULATORY_CARE_PROVIDER_SITE_OTHER): Payer: PPO | Admitting: Primary Care

## 2019-02-22 ENCOUNTER — Ambulatory Visit: Payer: PPO | Admitting: Primary Care

## 2019-02-22 ENCOUNTER — Encounter: Payer: Self-pay | Admitting: Primary Care

## 2019-02-22 ENCOUNTER — Other Ambulatory Visit: Payer: Self-pay

## 2019-02-22 ENCOUNTER — Encounter: Payer: Self-pay | Admitting: Pulmonary Disease

## 2019-02-22 DIAGNOSIS — R0600 Dyspnea, unspecified: Secondary | ICD-10-CM

## 2019-02-22 DIAGNOSIS — R0609 Other forms of dyspnea: Secondary | ICD-10-CM | POA: Diagnosis not present

## 2019-02-22 DIAGNOSIS — R918 Other nonspecific abnormal finding of lung field: Secondary | ICD-10-CM | POA: Diagnosis not present

## 2019-02-22 NOTE — Patient Instructions (Addendum)
Recommendations: - STOP Symbicort - Continue Albuterol rescue inhaler 2 puffs every 4-6 hours as needed for shortness of breath/wheezing   Orders: - Needs methacholine challenge re: dyspnea - patient wants it done at [place] [facility] - Please obtain CT results from today at [place]   Follow up: - Dr. Valeta Harms in 3 months OR sooner if shortness of breath worsens

## 2019-02-22 NOTE — Progress Notes (Signed)
Virtual Visit via Telephone Note  I connected with Yolanda Flowers on 02/22/19 at  3:30 PM EDT by telephone and verified that I am speaking with the correct person using two identifiers.  Location: Patient: Home Provider: Office   I discussed the limitations, risks, security and privacy concerns of performing an evaluation and management service by telephone and the availability of in person appointments. I also discussed with the patient that there may be a patient responsible charge related to this service. The patient expressed understanding and agreed to proceed.   History of Present Illness: 78 year old female, never smoked (lived with smokers). PMH significant for HTN, allergic rhinitis, GERD, emphysema. Patient of Dr. Valeta Harms, seen for initial consult on 11/04/18 for dyspnea. CTA Jan 2020 showed 6MM noncalcified nodule anterior RUL. No mention of ILD or emphysema. Some symptoms consistent with asthma. Reports improvement with albuterol. Restarted on Symbicort 80 and encouraged regular use.   Previous [place] encounter: 02/09/2019 Patient presents today for 3 months follow with PFTs. Still short of breath. Reports shortness of breath for 2-3 year, seems to be getting worse. Unbearable with light activity. Gets winded walking to mailbox and needs to sit down to recover which takes 15 mins. She stopped using her symbicort a few weeks ago because she did not notice a difference while on it. Wearing a bra makes her chest feel tight. Hurts across her chest. Some wheezing at night. Right leg swelling worse than left d/t only injury. She had a TEE in 2019 that showed normal left ventricular size and systolic function. EF 60-65%. Diastolic function appears mildly impaired. Follows with Dr. Geraldo Pitter with cardiology, last seen on 09/28/18. Denies cough.  Symbicort increased to 160 2 bid. Started on singulair. BNP normal. Eos absolute 200. Covid negative. Needs CT chest in July.   02/22/2019 Patient contacted  today for 4 week follow-up televisit. Reported no improvement on low dose ICS/LABA. She tried using Symbicort 160 but reported symptoms of weakness/jittness with use. She uses albuterol as needed 1-2 times a day. States that she doesn't use SABA that often. Feels it may help some when she does use it. Had CT chest done [facility] at [facility] as ordered. Oncologist told her that Hydrea would increased WBC.   Observations/Objective:  - No shortness of breath, wheezing or cough noted during phone conversation  Significant testing: 02/09/2019 PFTs - FVC 2.56 (103%), FEV1 2.11 (114%), ratio 82, no BD response. Normal DLCO.   05/2018 TEE - Normal left ventricular size and systolic function with no appreciablesegmental abnormality. Ejection fraction is visually estimated at 60-65%. Diastolic function appears mildly impaired. Mild mitral regurgitation. There is mild aortic sclerosis noted, with no evidence of stenosis. Mild tricuspid regurgitation. RVSP 28 mm Hg.   Assessment and Plan:  Dyspnea - Normal PFTs on 6/23 - Needs methacholine challenge to rule in/out asthma - Stop Symbicort d/t SE's and little clinical benefit  - Continue as needed albuterol   Pulmonary nodule - CTA Jan 2020 showed 6MM noncalcified nodule anterior RUL - Had 6 month repeat CT chest today at [facility]- requesting results to review   Follow Up Instructions:   - FU in 4 months with Dr. Valeta Harms or sooner if needed  I discussed the assessment and treatment plan with the patient. The patient was provided an opportunity to ask questions and all were answered. The patient agreed with the plan and demonstrated an understanding of the instructions.   The patient was advised to call back or  seek an in-person evaluation if the symptoms worsen or if the condition fails to improve as anticipated.  I provided 18 minutes of non-face-to-face time during this encounter.   Martyn Ehrich, NP

## 2019-02-23 ENCOUNTER — Other Ambulatory Visit: Payer: Self-pay | Admitting: Pulmonary Disease

## 2019-02-23 NOTE — Progress Notes (Signed)
PCCM: Thanks for seeing her.  Garner Nash, DO [facility] 02/23/2019 10:51 AM

## 2019-02-24 ENCOUNTER — Telehealth: Payer: Self-pay | Admitting: Primary Care

## 2019-02-24 DIAGNOSIS — R911 Solitary pulmonary nodule: Secondary | ICD-10-CM

## 2019-02-24 NOTE — Telephone Encounter (Signed)
Pleas let patient know repeat CT chest on 02/22/19 at [facility] showed no acute abnormality of the lungs. Small pulmonary nodulkes in the right lung are stable compared to prior exam (right upper lobe nodule measuring 64mm and right middle lobe nodules measuring 76mm). Incidental findings CAD and aortic atherosclerosis- follow up with PCP.   Recommend follow-up in 1 year CT chest wo contrast.

## 2019-02-24 NOTE — Telephone Encounter (Signed)
Spoke with patient. She verbalized understanding about results. She stated that she would like to have the follow up CT to be done at [place] as well. Advised her that I would place the order.   Nothing further needed at time of call.

## 2019-03-01 ENCOUNTER — Other Ambulatory Visit (HOSPITAL_COMMUNITY)
Admission: RE | Admit: 2019-03-01 | Discharge: 2019-03-01 | Disposition: A | Discharge status: Home / Self Care | Payer: PPO | Source: Ambulatory Visit | Attending: Pulmonary Disease | Admitting: Pulmonary Disease

## 2019-03-01 DIAGNOSIS — Z1159 Encounter for screening for other viral diseases: Secondary | ICD-10-CM | POA: Diagnosis not present

## 2019-03-01 LAB — SARS CORONAVIRUS 2 BY RT PCR (HOSPITAL ORDER, PERFORMED IN ~~LOC~~ HOSPITAL LAB): SARS Coronavirus 2: NEGATIVE

## 2019-03-04 ENCOUNTER — Ambulatory Visit (HOSPITAL_COMMUNITY)
Admission: RE | Admit: 2019-03-04 | Discharge: 2019-03-04 | Disposition: A | Discharge status: Home / Self Care | Payer: PPO | Source: Ambulatory Visit | Attending: Primary Care | Admitting: Primary Care

## 2019-03-04 ENCOUNTER — Other Ambulatory Visit: Payer: Self-pay

## 2019-03-04 ENCOUNTER — Encounter (HOSPITAL_COMMUNITY): Payer: PPO

## 2019-03-04 ENCOUNTER — Telehealth: Payer: Self-pay | Admitting: Primary Care

## 2019-03-04 DIAGNOSIS — R0609 Other forms of dyspnea: Secondary | ICD-10-CM | POA: Insufficient documentation

## 2019-03-04 DIAGNOSIS — R0600 Dyspnea, unspecified: Secondary | ICD-10-CM

## 2019-03-04 LAB — PULMONARY FUNCTION TEST
FEF 25-75 Post: 2.02 L/sec
FEF 25-75 Pre: 2.12 L/sec
FEF2575-%Change-Post: -4 %
FEF2575-%Pred-Post: 143 %
FEF2575-%Pred-Pre: 150 %
FEV1-%Change-Post: -1 %
FEV1-%Pred-Post: 110 %
FEV1-%Pred-Pre: 112 %
FEV1-Post: 2.03 L
FEV1-Pre: 2.07 L
FEV1FVC-%Change-Post: 1 %
FEV1FVC-%Pred-Pre: 109 %
FEV6-%Change-Post: -3 %
FEV6-%Pred-Post: 105 %
FEV6-%Pred-Pre: 108 %
FEV6-Post: 2.47 L
FEV6-Pre: 2.55 L
FEV6FVC-%Pred-Post: 105 %
FEV6FVC-%Pred-Pre: 105 %
FVC-%Change-Post: -3 %
FVC-%Pred-Post: 99 %
FVC-%Pred-Pre: 102 %
FVC-Post: 2.47 L
FVC-Pre: 2.55 L
Post FEV1/FVC ratio: 82 %
Post FEV6/FVC ratio: 100 %
Pre FEV1/FVC ratio: 81 %
Pre FEV6/FVC Ratio: 100 %

## 2019-03-04 MED ORDER — METHACHOLINE 0.25 MG/ML NEB SOLN
2.0000 mL | Freq: Once | RESPIRATORY_TRACT | Status: AC
  Administered 2019-03-04: 0.5 mg via RESPIRATORY_TRACT
  Filled 2019-03-04: qty 2

## 2019-03-04 MED ORDER — SODIUM CHLORIDE 0.9 % IN NEBU
3.0000 mL | INHALATION_SOLUTION | Freq: Once | RESPIRATORY_TRACT | Status: AC
  Administered 2019-03-04: 3 mL via RESPIRATORY_TRACT
  Filled 2019-03-04: qty 3

## 2019-03-04 MED ORDER — METHACHOLINE 4 MG/ML NEB SOLN
2.0000 mL | Freq: Once | RESPIRATORY_TRACT | Status: AC
  Administered 2019-03-04: 11:00:00 8 mg via RESPIRATORY_TRACT
  Filled 2019-03-04: qty 2

## 2019-03-04 MED ORDER — ALBUTEROL SULFATE (2.5 MG/3ML) 0.083% IN NEBU
2.5000 mg | INHALATION_SOLUTION | Freq: Once | RESPIRATORY_TRACT | Status: AC
  Administered 2019-03-04: 11:00:00 2.5 mg via RESPIRATORY_TRACT

## 2019-03-04 MED ORDER — METHACHOLINE 16 MG/ML NEB SOLN
2.0000 mL | Freq: Once | RESPIRATORY_TRACT | Status: AC
  Administered 2019-03-04: 11:00:00 32 mg via RESPIRATORY_TRACT
  Filled 2019-03-04: qty 2

## 2019-03-04 MED ORDER — METHACHOLINE 0.0625 MG/ML NEB SOLN
2.0000 mL | Freq: Once | RESPIRATORY_TRACT | Status: AC
  Administered 2019-03-04: 10:00:00 0.125 mg via RESPIRATORY_TRACT
  Filled 2019-03-04: qty 2

## 2019-03-04 MED ORDER — METHACHOLINE 1 MG/ML NEB SOLN
2.0000 mL | Freq: Once | RESPIRATORY_TRACT | Status: AC
  Administered 2019-03-04: 11:00:00 2 mg via RESPIRATORY_TRACT
  Filled 2019-03-04: qty 2

## 2019-03-04 NOTE — Telephone Encounter (Signed)
Call returned to patient, made aware of results below:  Notes recorded by Martyn Ehrich, NP on 03/04/2019 at 12:45 PM EDT  Please let patient know PFTs with methacholine challenged appeared normal. No evidence to suggested asthma. Can discuss more at next follow-up. Nothing further needed.   Voiced understanding. Nothing further is needed at this time.

## 2019-03-04 NOTE — Progress Notes (Signed)
Please let patient know PFTs with methacholine challenged appeared normal. No evidence to suggested asthma. Can discuss more at next follow-up. Nothing further needed.

## 2019-03-16 DIAGNOSIS — D0439 Carcinoma in situ of skin of other parts of face: Secondary | ICD-10-CM | POA: Diagnosis not present

## 2019-03-16 DIAGNOSIS — C44329 Squamous cell carcinoma of skin of other parts of face: Secondary | ICD-10-CM | POA: Diagnosis not present

## 2019-03-16 DIAGNOSIS — L570 Actinic keratosis: Secondary | ICD-10-CM | POA: Diagnosis not present

## 2019-03-25 DIAGNOSIS — H02119 Cicatricial ectropion of unspecified eye, unspecified eyelid: Secondary | ICD-10-CM | POA: Diagnosis not present

## 2019-04-05 DIAGNOSIS — Z6828 Body mass index (BMI) 28.0-28.9, adult: Secondary | ICD-10-CM | POA: Diagnosis not present

## 2019-04-05 DIAGNOSIS — I10 Essential (primary) hypertension: Secondary | ICD-10-CM | POA: Diagnosis not present

## 2019-05-12 DIAGNOSIS — K219 Gastro-esophageal reflux disease without esophagitis: Secondary | ICD-10-CM | POA: Diagnosis not present

## 2019-05-12 DIAGNOSIS — E785 Hyperlipidemia, unspecified: Secondary | ICD-10-CM | POA: Diagnosis not present

## 2019-05-12 DIAGNOSIS — C4490 Unspecified malignant neoplasm of skin, unspecified: Secondary | ICD-10-CM | POA: Diagnosis not present

## 2019-05-12 DIAGNOSIS — Z79899 Other long term (current) drug therapy: Secondary | ICD-10-CM | POA: Diagnosis not present

## 2019-05-12 DIAGNOSIS — I708 Atherosclerosis of other arteries: Secondary | ICD-10-CM | POA: Diagnosis not present

## 2019-05-12 DIAGNOSIS — I10 Essential (primary) hypertension: Secondary | ICD-10-CM | POA: Diagnosis not present

## 2019-05-12 DIAGNOSIS — M7989 Other specified soft tissue disorders: Secondary | ICD-10-CM | POA: Diagnosis not present

## 2019-05-12 DIAGNOSIS — Z1331 Encounter for screening for depression: Secondary | ICD-10-CM | POA: Diagnosis not present

## 2019-05-12 DIAGNOSIS — Z9181 History of falling: Secondary | ICD-10-CM | POA: Diagnosis not present

## 2019-05-12 DIAGNOSIS — E663 Overweight: Secondary | ICD-10-CM | POA: Diagnosis not present

## 2019-05-12 DIAGNOSIS — D473 Essential (hemorrhagic) thrombocythemia: Secondary | ICD-10-CM | POA: Diagnosis not present

## 2019-05-12 DIAGNOSIS — Z780 Asymptomatic menopausal state: Secondary | ICD-10-CM | POA: Diagnosis not present

## 2019-05-13 DIAGNOSIS — L570 Actinic keratosis: Secondary | ICD-10-CM | POA: Diagnosis not present

## 2019-05-14 DIAGNOSIS — C519 Malignant neoplasm of vulva, unspecified: Secondary | ICD-10-CM | POA: Diagnosis not present

## 2019-05-14 DIAGNOSIS — Z85820 Personal history of malignant melanoma of skin: Secondary | ICD-10-CM | POA: Diagnosis not present

## 2019-05-14 DIAGNOSIS — D72829 Elevated white blood cell count, unspecified: Secondary | ICD-10-CM | POA: Diagnosis not present

## 2019-05-14 DIAGNOSIS — D473 Essential (hemorrhagic) thrombocythemia: Secondary | ICD-10-CM | POA: Diagnosis not present

## 2019-05-14 DIAGNOSIS — E559 Vitamin D deficiency, unspecified: Secondary | ICD-10-CM | POA: Diagnosis not present

## 2019-05-14 DIAGNOSIS — C4372 Malignant melanoma of left lower limb, including hip: Secondary | ICD-10-CM | POA: Diagnosis not present

## 2019-05-14 DIAGNOSIS — D708 Other neutropenia: Secondary | ICD-10-CM | POA: Diagnosis not present

## 2019-06-02 DIAGNOSIS — Z23 Encounter for immunization: Secondary | ICD-10-CM | POA: Diagnosis not present

## 2019-06-09 ENCOUNTER — Ambulatory Visit: Payer: PPO | Admitting: Pulmonary Disease

## 2019-06-16 ENCOUNTER — Ambulatory Visit: Payer: PPO | Admitting: Pulmonary Disease

## 2019-06-16 ENCOUNTER — Encounter: Payer: Self-pay | Admitting: Pulmonary Disease

## 2019-06-16 ENCOUNTER — Other Ambulatory Visit: Payer: Self-pay

## 2019-06-16 VITALS — BP 132/72 | HR 85 | Temp 98.0°F | Ht 62.25 in | Wt 174.2 lb

## 2019-06-16 DIAGNOSIS — R0600 Dyspnea, unspecified: Secondary | ICD-10-CM | POA: Diagnosis not present

## 2019-06-16 DIAGNOSIS — R911 Solitary pulmonary nodule: Secondary | ICD-10-CM

## 2019-06-16 DIAGNOSIS — R0609 Other forms of dyspnea: Secondary | ICD-10-CM

## 2019-06-16 NOTE — Patient Instructions (Addendum)
Thank you for visiting Dr. Valeta Harms at [facility]. Today we recommend the following:  Increase your exercise tolerance daily.   Return in about 9 months (around 03/15/2020). See me after the CT Chest for lung nodule follow up is complete.     Please do your part to reduce the spread of COVID-19.

## 2019-06-16 NOTE — Progress Notes (Signed)
Synopsis: Referred in March 2020 for SOB/DOE by [name], MD  Subjective:   [name] GENDER: female DOB: 08/26/1940, MRN: [id number]  Chief Complaint  Patient presents with  . Follow-up    Past medical history of essential thrombocytosis on hydroxyurea.  C/o ongoing SOB and DOE. She states that she has had SOB and DOE with significant exertion. She tried mowing this past week and couldn't. She has trouble walking. Lifelong non-smoker. Lived with smokers, all her life and then married a smoker and her husband quit smoking 4 years into their marriage. They were married 18 years, husband passed 7 years ago. She recently lost her dog too. She has symptoms primarily with exertion.  She does notice wheezing on occasion.  She does state that albuterol has made her symptoms better at times.  She was placed on Symbicort in the past but she only used it a couple of times and said that she did not see any difference and so she did not continue it but she only used it 2 or 3 times.  She is limited somewhat in her mobility due to her back pain.  She tries to manage this just by symptom control and not overdoing it.  Patient denies fevers chills night sweats weight loss.  She does occasionally have a wheeze.  OV 06/16/2019: Patient last by [place] seen by me in March 2020.  History of asthma type symptoms was started on Symbicort.  Patient had echocardiogram with mildly impaired diastolic dysfunction, RVSP 28.  No evidence of obstruction on PFTs.  Started trial of Singulair nightly.  Patient had minimal improvement with low-dose ICS/LABA, this was increased to Symbicort 162 puffs, twice daily.  Decision was made to order methacholine challenge July 2020 as she has normal PFTs patient's MCT was normal and the FEV1 did not decline.  Patient has not been ill in any of her inhalers or using any of the maintenance medications.  Her symptoms have not changed.  Further discussion about her symptomatology  she feels as if it is related to her physical activity.  She feels that she is just getting tired more easily.  Even though she thinks that she wants to be able to go and do more.  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  She is reassured by the fact that most of the testing that has been completed has not revealed anything significant or severe.   Past Medical History:  Diagnosis Date  . Anxiety   . Arthritis   . Bilateral edema of lower extremity   . Complication of anesthesia    difficulty waking  . COPD (chronic obstructive pulmonary disease) ([place])   . Dyspnea on exertion   . GERD (gastroesophageal reflux disease)   . History of melanoma excision    left greast toe 2015  . History of squamous cell carcinoma excision    face--  multiple excisions  . History of subdural hematoma    2008  . Hypertension   . OSA (obstructive sleep apnea)    intolerant  . Thrombocytosis ([place])    takes hydoxyurea--  MONITORED BY DR Hinton Rao [facility])  . VIN III (vulvar intraepithelial neoplasia III)   . Wears dentures    UPPER AND LOWER PARTIAL  . Wears glasses      Family History  Problem  Relation Age of Onset  . Clotting disorder Mother   . Stroke Father      Past Surgical History:  Procedure Laterality Date  . ABDOMINAL HYSTERECTOMY  age 53  . KNEE ARTHROSCOPY Left 2004  . MELANOMA EXCISION  2015   left great toe  . REPAIR PERONEAL TENDONS ANKLE  2004  . SUBDURAL HEMATOMA EVACUATION VIA CRANIOTOMY  2008      week later  post-op  [facility]  . VULVECTOMY N/A 10/24/2015   Procedure: WIDE LOCAL EXCISION OF THE VULVA ;  Surgeon: Everitt Amber, MD;  Location: [place];  Service: Gynecology;  Laterality: N/A;    Social History   Socioeconomic History  . Marital status: Married    Spouse name: Not on file  . Number of children: Not on file  . Years of  education: Not on file  . Highest education level: Not on file  Occupational History  . Not on file  Social Needs  . Financial resource strain: Not on file  . Food insecurity    Worry: Not on file    Inability: Not on file  . Transportation needs    Medical: Not on file    Non-medical: Not on file  Tobacco Use  . Smoking status: Never Smoker  . Smokeless tobacco: Never Used  Substance and Sexual Activity  . Alcohol use: No  . Drug use: No  . Sexual activity: Not on file  Lifestyle  . Physical activity    Days per week: Not on file    Minutes per session: Not on file  . Stress: Not on file  Relationships  . Social Herbalist on phone: Not on file    Gets together: Not on file    Attends religious service: Not on file    Active member of club or organization: Not on file    Attends meetings of clubs or organizations: Not on file    Relationship status: Not on file  . Intimate partner violence    Fear of current or ex partner: Not on file    Emotionally abused: Not on file    Physically abused: Not on file    Forced sexual activity: Not on file  Other Topics Concern  . Not on file  Social History Narrative  . Not on file     Allergies  Allergen Reactions  . Cephalexin Hives  . Penicillins Rash    Childhood reaction     Outpatient Medications Prior to Visit  Medication Sig Dispense Refill  . albuterol (VENTOLIN HFA) 108 (90 Base) MCG/ACT inhaler Inhale 2 puffs into the lungs every 6 (six) hours as needed for wheezing or shortness of breath. 18 g 3  . aspirin EC 81 MG tablet Take 81 mg by mouth.    . Calcium Carb-Cholecalciferol (CALCIUM 600 + D PO) Take 2 tablets by mouth daily.    . cholecalciferol (VITAMIN D3) 25 MCG (1000 UT) tablet Take 1,000 Units by mouth daily.    . fluticasone (FLONASE) 50 MCG/ACT nasal spray Place 2 sprays into the nose every evening.     . hydroxyurea (HYDREA) 500 MG capsule Take 500-1,000 mg by mouth 2 (two) times daily. Takes  2 tab every am and 1 tab every pm--  STARTING Thursday 10-12-2015 PT CUT DOWN TO 1 TAB IN AM AND PM PRIOR TO SURGERY 10-24-2015  PER DR ROSSI AND DR Hinton Rao ORDERS    . lansoprazole (PREVACID) 30 MG  capsule Take 30 mg by mouth every morning.     Marland Kitchen lisinopril (PRINIVIL,ZESTRIL) 20 MG tablet Take 15 mg by mouth every evening.     . Magnesium 250 MG TABS Take by mouth.    . montelukast (SINGULAIR) 10 MG tablet Take 1 tablet (10 mg total) by mouth at bedtime. 30 tablet 11  . oxyCODONE-acetaminophen (PERCOCET/ROXICET) 5-325 MG tablet Take 1-2 tablets by mouth every 4 (four) hours as needed for severe pain. 30 tablet 0  . Spacer/Aero-Holding Chambers (AEROCHAMBER MV) inhaler Use as instructed 1 each 0   No facility-administered medications prior to visit.     Review of Systems  Constitutional: Negative for chills, fever, malaise/fatigue and weight loss.  HENT: Negative for hearing loss, sore throat and tinnitus.   Eyes: Negative for blurred vision and double vision.  Respiratory: Positive for shortness of breath. Negative for cough, hemoptysis, sputum production, wheezing and stridor.   Cardiovascular: Negative for chest pain, palpitations, orthopnea, leg swelling and PND.  Gastrointestinal: Negative for abdominal pain, constipation, diarrhea, heartburn, nausea and vomiting.  Genitourinary: Negative for dysuria, hematuria and urgency.  Musculoskeletal: Negative for joint pain and myalgias.  Skin: Negative for itching and rash.  Neurological: Negative for dizziness, tingling, weakness and headaches.  Endo/Heme/Allergies: Negative for environmental allergies. Does not bruise/bleed easily.  Psychiatric/Behavioral: Negative for depression. The patient is not nervous/anxious and does not have insomnia.   All other systems reviewed and are negative.    Objective:  Physical Exam Vitals signs reviewed.  Constitutional:      General: She is not in acute distress.    Appearance: She is  well-developed.  HENT:     Head: Normocephalic and atraumatic.  Eyes:     General: No scleral icterus.    Conjunctiva/sclera: Conjunctivae normal.     Pupils: Pupils are equal, round, and reactive to light.  Neck:     Musculoskeletal: Neck supple.     Vascular: No JVD.     Trachea: No tracheal deviation.  Cardiovascular:     Rate and Rhythm: Normal rate and regular rhythm.     Heart sounds: Normal heart sounds. No murmur.  Pulmonary:     Effort: Pulmonary effort is normal. No tachypnea, accessory muscle usage or respiratory distress.     Breath sounds: Normal breath sounds. No stridor. No wheezing, rhonchi or rales.  Abdominal:     General: Bowel sounds are normal. There is no distension.     Palpations: Abdomen is soft.     Tenderness: There is no abdominal tenderness.  Musculoskeletal:        General: No tenderness.  Lymphadenopathy:     Cervical: No cervical adenopathy.  Skin:    General: Skin is warm and dry.     Capillary Refill: Capillary refill takes less than 2 seconds.     Findings: No rash.  Neurological:     Mental Status: She is alert and oriented to person, place, and time.  Psychiatric:        Behavior: Behavior normal.      Vitals:   06/16/19 1106  BP: 132/72  Pulse: 85  Temp: 98 F (36.7 C)  TempSrc: Oral  SpO2: 96%  Weight: 174 lb 3.2 oz (79 kg)  Height: 5' 2.25" (1.581 m)   96% on RA BMI Readings from Last 3 Encounters:  06/16/19 31.61 kg/m  02/09/19 31.46 kg/m  11/04/18 28.76 kg/m   Wt Readings from Last 3 Encounters:  06/16/19 174 lb 3.2 oz (79 kg)  02/09/19 173 lb 6.4 oz (78.7 kg)  11/04/18 172 lb 12.8 oz (78.4 kg)    CBC    Component Value Date/Time   WBC 15.7 (H) 02/09/2019 1400   RBC 3.35 (L) 02/09/2019 1400   HGB 11.6 (L) 02/09/2019 1400   HCT 35.6 (L) 02/09/2019 1400   PLT 204.0 02/09/2019 1400   MCV 106.1 (H) 02/09/2019 1400   MCHC 32.7 02/09/2019 1400   RDW 16.7 (H) 02/09/2019 1400   LYMPHSABS 1.6 02/09/2019 1400    MONOABS 1.5 (H) 02/09/2019 1400   EOSABS 0.2 02/09/2019 1400   BASOSABS 0.2 (H) 02/09/2019 1400    Chest Imaging: States that a CT of the chest was completed that revealed a small lung nodule from her oncology office.  I do not have these images to review myself.  But she does state that she has follow-up planned with her oncologist regarding this. Imaging completed July 2020 reveals 6 mm lung nodule.  Report scanned into epic.  1 year follow-up CT imaging ordered by Derl Barrow, NP  Pulmonary Functions Testing Results: PFT Results Latest Ref Rng & Units 03/04/2019 02/09/2019  FVC-Pre L 2.55 2.55  FVC-Predicted Pre % 102 102  FVC-Post L 2.47 2.56  FVC-Predicted Post % 99 103  Pre FEV1/FVC % % 81 78  Post FEV1/FCV % % 82 82  FEV1-Pre L 2.07 1.98  FEV1-Predicted Pre % 112 107  FEV1-Post L 2.03 2.11  DLCO UNC% % - 128  DLCO COR %Predicted % - 129  TLC L - 5.41  TLC % Predicted % - 113  RV % Predicted % - 132     FeNO: None   Pathology: None   Echocardiogram: None   Heart Catheterization: None     Assessment & Plan:   DOE (dyspnea on exertion)  Solitary pulmonary nodule on lung CT   Discussion:  78 year old female with complaints of shortness of breath and dyspnea on exertion.  This is predominantly related to physical activity.  She had some mild symptoms in the spring that may be consistent with asthma type symptoms however she has a negative methacholine challenge and pulmonary function test with no obstruction but does have a elevated DLCO.  Otherwise should her symptoms are unchanged after being on inhalers.  At this time would recommend stopping all of them.  Plan: I think she should try to increase her daily exercise tolerance is much as possible.  She has been seen by cardiology and cleared from that standpoint with a normal perfusion Lexi scan. Patient wants to try to exercise more and see if this helps improve her physicality and her symptoms if she is able to  slowly increase her threshold by routine exercise. I encouraged her to continue to do this.  As for her lung nodule we will follow up with a repeat CT scan in 1 year.  This should be done in July 2021.  Patient should follow-up in our office after the completion of the CT scan for nodule follow-up.  Greater than 50% of this patient's 50-minute office visit was been face-to-face discussing the above recommendations and treatment plan.   Current Outpatient Medications:  .  albuterol (VENTOLIN HFA) 108 (90 Base) MCG/ACT inhaler, Inhale 2 puffs into the lungs every 6 (six) hours as needed for wheezing or shortness of breath., Disp: 18 g, Rfl: 3 .  aspirin EC 81 MG tablet, Take 81 mg by mouth., Disp: , Rfl:  .  Calcium Carb-Cholecalciferol (CALCIUM 600 + D PO),  Take 2 tablets by mouth daily., Disp: , Rfl:  .  cholecalciferol (VITAMIN D3) 25 MCG (1000 UT) tablet, Take 1,000 Units by mouth daily., Disp: , Rfl:  .  fluticasone (FLONASE) 50 MCG/ACT nasal spray, Place 2 sprays into the nose every evening. , Disp: , Rfl:  .  hydroxyurea (HYDREA) 500 MG capsule, Take 500-1,000 mg by mouth 2 (two) times daily. Takes 2 tab every am and 1 tab every pm--  STARTING Thursday 10-12-2015 PT CUT DOWN TO 1 TAB IN AM AND PM PRIOR TO SURGERY 10-24-2015  PER DR ROSSI AND DR Hinton Rao ORDERS, Disp: , Rfl:  .  lansoprazole (PREVACID) 30 MG capsule, Take 30 mg by mouth every morning. , Disp: , Rfl:  .  lisinopril (PRINIVIL,ZESTRIL) 20 MG tablet, Take 15 mg by mouth every evening. , Disp: , Rfl:  .  Magnesium 250 MG TABS, Take by mouth., Disp: , Rfl:  .  montelukast (SINGULAIR) 10 MG tablet, Take 1 tablet (10 mg total) by mouth at bedtime., Disp: 30 tablet, Rfl: 11 .  oxyCODONE-acetaminophen (PERCOCET/ROXICET) 5-325 MG tablet, Take 1-2 tablets by mouth every 4 (four) hours as needed for severe pain., Disp: 30 tablet, Rfl: 0 .  Spacer/Aero-Holding Chambers (AEROCHAMBER MV) inhaler, Use as instructed, Disp: 1 each, Rfl: 0    Garner Nash, DO [facility] 06/16/2019 11:13 AM

## 2019-06-29 DIAGNOSIS — R233 Spontaneous ecchymoses: Secondary | ICD-10-CM | POA: Diagnosis not present

## 2019-06-29 DIAGNOSIS — C44319 Basal cell carcinoma of skin of other parts of face: Secondary | ICD-10-CM | POA: Diagnosis not present

## 2019-06-29 DIAGNOSIS — L570 Actinic keratosis: Secondary | ICD-10-CM | POA: Diagnosis not present

## 2019-07-01 DIAGNOSIS — M4120 Other idiopathic scoliosis, site unspecified: Secondary | ICD-10-CM | POA: Diagnosis not present

## 2019-08-05 DIAGNOSIS — M7989 Other specified soft tissue disorders: Secondary | ICD-10-CM | POA: Diagnosis not present

## 2019-08-05 DIAGNOSIS — C4490 Unspecified malignant neoplasm of skin, unspecified: Secondary | ICD-10-CM | POA: Diagnosis not present

## 2019-08-05 DIAGNOSIS — I708 Atherosclerosis of other arteries: Secondary | ICD-10-CM | POA: Diagnosis not present

## 2019-08-05 DIAGNOSIS — Z780 Asymptomatic menopausal state: Secondary | ICD-10-CM | POA: Diagnosis not present

## 2019-08-05 DIAGNOSIS — R600 Localized edema: Secondary | ICD-10-CM | POA: Diagnosis not present

## 2019-08-05 DIAGNOSIS — E663 Overweight: Secondary | ICD-10-CM | POA: Diagnosis not present

## 2019-08-05 DIAGNOSIS — I10 Essential (primary) hypertension: Secondary | ICD-10-CM | POA: Diagnosis not present

## 2019-08-05 DIAGNOSIS — D473 Essential (hemorrhagic) thrombocythemia: Secondary | ICD-10-CM | POA: Diagnosis not present

## 2019-08-05 DIAGNOSIS — K219 Gastro-esophageal reflux disease without esophagitis: Secondary | ICD-10-CM | POA: Diagnosis not present

## 2019-08-05 DIAGNOSIS — N289 Disorder of kidney and ureter, unspecified: Secondary | ICD-10-CM | POA: Diagnosis not present

## 2019-08-05 DIAGNOSIS — M545 Low back pain: Secondary | ICD-10-CM | POA: Diagnosis not present

## 2019-08-05 DIAGNOSIS — E782 Mixed hyperlipidemia: Secondary | ICD-10-CM | POA: Diagnosis not present

## 2019-08-09 DIAGNOSIS — L02214 Cutaneous abscess of groin: Secondary | ICD-10-CM | POA: Diagnosis not present

## 2019-08-24 ENCOUNTER — Other Ambulatory Visit: Payer: Self-pay

## 2019-08-24 ENCOUNTER — Ambulatory Visit: Payer: PPO | Admitting: Cardiology

## 2019-08-24 DIAGNOSIS — N289 Disorder of kidney and ureter, unspecified: Secondary | ICD-10-CM

## 2019-08-24 DIAGNOSIS — E538 Deficiency of other specified B group vitamins: Secondary | ICD-10-CM

## 2019-08-24 DIAGNOSIS — Z6829 Body mass index (BMI) 29.0-29.9, adult: Secondary | ICD-10-CM

## 2019-08-24 DIAGNOSIS — I7090 Unspecified atherosclerosis: Secondary | ICD-10-CM

## 2019-08-24 DIAGNOSIS — Z6825 Body mass index (BMI) 25.0-25.9, adult: Secondary | ICD-10-CM

## 2019-08-24 DIAGNOSIS — M7989 Other specified soft tissue disorders: Secondary | ICD-10-CM

## 2019-08-24 DIAGNOSIS — C4442 Squamous cell carcinoma of skin of scalp and neck: Secondary | ICD-10-CM

## 2019-08-24 DIAGNOSIS — C4370 Malignant melanoma of unspecified lower limb, including hip: Secondary | ICD-10-CM

## 2019-08-24 DIAGNOSIS — F1120 Opioid dependence, uncomplicated: Secondary | ICD-10-CM

## 2019-08-24 DIAGNOSIS — M4120 Other idiopathic scoliosis, site unspecified: Secondary | ICD-10-CM | POA: Diagnosis not present

## 2019-08-24 DIAGNOSIS — C4490 Unspecified malignant neoplasm of skin, unspecified: Secondary | ICD-10-CM

## 2019-08-24 DIAGNOSIS — M7071 Other bursitis of hip, right hip: Secondary | ICD-10-CM

## 2019-08-24 DIAGNOSIS — I708 Atherosclerosis of other arteries: Secondary | ICD-10-CM

## 2019-08-24 DIAGNOSIS — E559 Vitamin D deficiency, unspecified: Secondary | ICD-10-CM

## 2019-08-24 DIAGNOSIS — E782 Mixed hyperlipidemia: Secondary | ICD-10-CM

## 2019-08-24 DIAGNOSIS — R600 Localized edema: Secondary | ICD-10-CM

## 2019-08-24 DIAGNOSIS — M47816 Spondylosis without myelopathy or radiculopathy, lumbar region: Secondary | ICD-10-CM | POA: Diagnosis not present

## 2019-08-24 DIAGNOSIS — I10 Essential (primary) hypertension: Secondary | ICD-10-CM | POA: Diagnosis not present

## 2019-08-24 HISTORY — DX: Unspecified malignant neoplasm of skin, unspecified: C44.90

## 2019-08-24 HISTORY — DX: Atherosclerosis of other arteries: I70.8

## 2019-08-24 HISTORY — DX: Body mass index (BMI) 25.0-25.9, adult: Z68.25

## 2019-08-24 HISTORY — DX: Opioid dependence, uncomplicated: F11.20

## 2019-08-24 HISTORY — DX: Malignant melanoma of unspecified lower limb, including hip: C43.70

## 2019-08-24 HISTORY — DX: Other specified soft tissue disorders: M79.89

## 2019-08-24 HISTORY — DX: Body mass index (BMI) 29.0-29.9, adult: Z68.29

## 2019-08-24 HISTORY — DX: Disorder of kidney and ureter, unspecified: N28.9

## 2019-08-24 HISTORY — DX: Unspecified atherosclerosis: I70.90

## 2019-08-24 HISTORY — DX: Squamous cell carcinoma of skin of scalp and neck: C44.42

## 2019-08-24 HISTORY — DX: Mixed hyperlipidemia: E78.2

## 2019-08-24 HISTORY — DX: Vitamin D deficiency, unspecified: E55.9

## 2019-08-24 HISTORY — DX: Deficiency of other specified B group vitamins: E53.8

## 2019-08-24 HISTORY — DX: Localized edema: R60.0

## 2019-08-24 HISTORY — DX: Other bursitis of hip, right hip: M70.71

## 2019-08-26 ENCOUNTER — Ambulatory Visit (INDEPENDENT_AMBULATORY_CARE_PROVIDER_SITE_OTHER): Payer: PPO | Admitting: Cardiology

## 2019-08-26 ENCOUNTER — Other Ambulatory Visit: Payer: Self-pay

## 2019-08-26 ENCOUNTER — Encounter: Payer: Self-pay | Admitting: Cardiology

## 2019-08-26 VITALS — BP 146/88 | HR 74 | Ht 62.5 in | Wt 176.0 lb

## 2019-08-26 DIAGNOSIS — I200 Unstable angina: Secondary | ICD-10-CM

## 2019-08-26 DIAGNOSIS — I10 Essential (primary) hypertension: Secondary | ICD-10-CM

## 2019-08-26 DIAGNOSIS — I209 Angina pectoris, unspecified: Secondary | ICD-10-CM

## 2019-08-26 DIAGNOSIS — R072 Precordial pain: Secondary | ICD-10-CM

## 2019-08-26 HISTORY — DX: Unstable angina: I20.0

## 2019-08-26 MED ORDER — NITROGLYCERIN 0.4 MG SL SUBL: 0.4000 mg | SUBLINGUAL_TABLET | SUBLINGUAL | 3 refills | Status: DC | PRN

## 2019-08-26 MED ORDER — METOPROLOL TARTRATE 50 MG PO TABS: 100.0000 mg | ORAL_TABLET | Freq: Once | ORAL | 0 refills | Status: DC

## 2019-08-26 NOTE — Progress Notes (Signed)
Cardiology Office Note:    Date:  08/26/2019   ID:  Yolanda Flowers, DOB [birth year], MRN [id number]  PCP:  Yolanda Johns, MD  Cardiologist:  Yolanda Lindau, MD   Referring MD: Yolanda Johns, MD    ASSESSMENT:    1. Essential hypertension   2. Angina pectoris ([place])    PLAN:    In order of problems listed above:  1. Angina pectoris: Patient symptoms are significant.  She takes an aspirin on a daily basis coated baby aspirin 81 mg.  Sublingual nitroglycerin prescription was sent, its protocol and 911 protocol explained and the patient vocalized understanding questions were answered to the patient's satisfaction.  In view of her symptoms I suggested coronary CT angiography and she is agreeable.  She knows to go to the nearest emergency room for any significant symptoms. 2. Essential hypertension: Blood pressure is stable.  There is an element of pain which is causing some elevation in blood pressure.  Patient understands this. 3. Patient will be seen in follow-up appointment in 2 months or earlier if the patient has any concerns    Medication Adjustments/Labs and Tests Ordered: Current medicines are reviewed at length with the patient today.  Concerns regarding medicines are outlined above.  No orders of the defined types were placed in this encounter.  No orders of the defined types were placed in this encounter.    Chief Complaint  Patient presents with  . Follow-up     History of Present Illness:    Yolanda Flowers is a 79 y.o. female.  Patient has past medical history of essential hypertension.  The patient has significant issues with back pain and sees an orthopedic surgeon.  She tells me that she has been in significant pain and takes pain medications every day.  She also complains of chest tightness at times which is significant.  This concerns her.  She had a stress test about a year ago which was unremarkable.  At the time of my evaluation, the patient is alert awake  oriented and in no distress.  No radiation of the symptoms to the neck or to the arms.  Patient wants this to be assessed again.  Past Medical History:  Diagnosis Date  . Anxiety   . Arthritis   . Asthma   . Bilateral edema of lower extremity   . Complication of anesthesia    difficulty waking  . Congestive heart failure (CHF) ([place])   . COPD (chronic obstructive pulmonary disease) ([place])   . Dyspnea on exertion   . GERD (gastroesophageal reflux disease)   . History of melanoma excision    left greast toe 2015  . History of squamous cell carcinoma excision    face--  multiple excisions  . History of subdural hematoma    2008  . Hypertension   . OSA (obstructive sleep apnea)    intolerant  . Thrombocytosis ([place])    takes hydoxyurea--  MONITORED BY DR Yolanda Flowers [facility])  . VIN III (vulvar intraepithelial neoplasia III)   . Wears dentures    UPPER AND LOWER PARTIAL  . Wears glasses     Past Surgical History:  Procedure Laterality Date  . ABDOMINAL HYSTERECTOMY  age 29  . KNEE ARTHROSCOPY Left 2004  . MELANOMA EXCISION  2015   left great toe  . REPAIR PERONEAL TENDONS ANKLE  2004  . SUBDURAL HEMATOMA EVACUATION VIA CRANIOTOMY  2008      week later  post-op  [facility]  . VULVECTOMY N/A 10/24/2015   Procedure: WIDE LOCAL EXCISION OF THE VULVA ;  Surgeon: Yolanda Amber, MD;  Location: [place];  Service: Gynecology;  Laterality: N/A;    Current Medications: Current Meds  Medication Sig  . aspirin EC 81 MG tablet Take 81 mg by mouth.  . Calcium Carb-Cholecalciferol (CALCIUM 600 + D PO) Take 2 tablets by mouth daily.  . cholecalciferol (VITAMIN D3) 25 MCG (1000 UT) tablet Take 1,000 Units by mouth daily.  . fluticasone (FLONASE) 50 MCG/ACT nasal spray Place 2 sprays into the nose every evening.   . hydroxyurea (HYDREA) 500 MG capsule Take 500-1,000 mg by mouth 2 (two) times daily. Takes 2 tab every am and 1 tab every pm--  STARTING Thursday  10-12-2015 PT CUT DOWN TO 1 TAB IN AM AND PM PRIOR TO SURGERY 10-24-2015  PER DR ROSSI AND DR Yolanda Flowers ORDERS  . lansoprazole (PREVACID) 30 MG capsule Take 30 mg by mouth every morning.   Marland Kitchen lisinopril (ZESTRIL) 10 MG tablet Take 15 mg by mouth at bedtime.  . Magnesium 250 MG TABS Take by mouth.  . oxyCODONE-acetaminophen (PERCOCET/ROXICET) 5-325 MG tablet Take 1-2 tablets by mouth every 4 (four) hours as needed for severe pain.  . Vitamin D, Ergocalciferol, (DRISDOL) 1.25 MG (50000 UT) CAPS capsule Take 50,000 Units by mouth every 14 (fourteen) days.     Allergies:   Cephalexin, Lactose intolerance (gi), and Penicillins   Social History   Socioeconomic History  . Marital status: Married    Spouse name: Not on file  . Number of children: Not on file  . Years of education: Not on file  . Highest education level: Not on file  Occupational History  . Not on file  Tobacco Use  . Smoking status: Never Smoker  . Smokeless tobacco: Never Used  Substance and Sexual Activity  . Alcohol use: No  . Drug use: No  . Sexual activity: Not on file  Other Topics Concern  . Not on file  Social History Narrative  . Not on file   Social Determinants of Health   Financial Resource Strain:   . Difficulty of Paying Living Expenses: Not on file  Food Insecurity:   . Worried About Charity fundraiser in the Last Year: Not on file  . Ran Out of Food in the Last Year: Not on file  Transportation Needs:   . Lack of Transportation (Medical): Not on file  . Lack of Transportation (Non-Medical): Not on file  Physical Activity:   . Days of Exercise per Week: Not on file  . Minutes of Exercise per Session: Not on file  Stress:   . Feeling of Stress : Not on file  Social Connections:   . Frequency of Communication with Friends and Family: Not on file  . Frequency of Social Gatherings with Friends and Family: Not on file  . Attends Religious Services: Not on file  . Active Member of Clubs or  Organizations: Not on file  . Attends Archivist Meetings: Not on file  . Marital Status: Not on file     Family History: The patient's family history includes Arthritis in her brother, father, mother, and sister; Asthma in her father; Clotting disorder in her mother; Emphysema in her father; Heart disease in her brother, father, mother, and sister; Hypertension in her brother, father, mother, and sister; Kidney failure in her father; Stroke in her father.  ROS:   Please see  the history of present illness.    All other systems reviewed and are negative.  EKGs/Labs/Other Studies Reviewed:    The following studies were reviewed today: EKG reveals sinus rhythm and nonspecific ST-T changes   Recent Labs: 02/09/2019: BUN 19; Creatinine, Ser 1.16; Hemoglobin 11.6; Platelets 204.0; Potassium 4.0; Pro B Natriuretic peptide (BNP) 70.0; Sodium 139  Recent Lipid Panel No results found for: CHOL, TRIG, HDL, CHOLHDL, VLDL, LDLCALC, LDLDIRECT  Physical Exam:    VS:  BP (!) 146/88   Pulse 74   Ht 5' 2.5" (1.588 m)   Wt 176 lb (79.8 kg)   SpO2 (!) 86%   BMI 31.68 kg/m     Wt Readings from Last 3 Encounters:  08/26/19 176 lb (79.8 kg)  06/16/19 174 lb 3.2 oz (79 kg)  02/09/19 173 lb 6.4 oz (78.7 kg)     GEN: Patient is in no acute distress HEENT: Normal NECK: No JVD; No carotid bruits LYMPHATICS: No lymphadenopathy CARDIAC: Hear sounds regular, 2/6 systolic murmur at the apex. RESPIRATORY:  Clear to auscultation without rales, wheezing or rhonchi  ABDOMEN: Soft, non-tender, non-distended MUSCULOSKELETAL:  No edema; No deformity  SKIN: Warm and dry NEUROLOGIC:  Alert and oriented x 3 PSYCHIATRIC:  Normal affect   Signed, Yolanda Lindau, MD  08/26/2019 9:08 AM    [place]

## 2019-08-26 NOTE — Addendum Note (Signed)
Addended by: Beckey Rutter on: 08/26/2019 09:17 AM   Modules accepted: Orders

## 2019-08-26 NOTE — Patient Instructions (Addendum)
Medication Instructions:  Your physician has recommended you make the following change in your medication:   START taking nitroglycerin as needed for chest pain, When having chest pain, stop what you are doing and sit down. Take 1 nitro, wait 5 minutes. Still having chest pain, take 1 nitro, wait 5 minutes. Still having chest pain, take 1 nitro, dial 911. Total of 3 nitro in 15 minutes.   If you need a refill on your cardiac medications before your next appointment, please call your pharmacy.   Lab work: You will need to come in 7 days prior to procedure to have a BMP drawn.  If you have labs (blood work) drawn today and your tests are completely normal, you will receive your results only by: Marland Kitchen MyChart Message (if you have MyChart) OR . A paper copy in the mail If you have any lab test that is abnormal or we need to change your treatment, we will call you to review the results.  Testing/Procedures: You had an EKG performed today  Non-Cardiac CT scanning, (CAT scanning), is a noninvasive, special x-ray that produces cross-sectional images of the body using x-rays and a computer. CT scans help physicians diagnose and treat medical conditions. For some CT exams, a contrast material is used to enhance visibility in the area of the body being studied. CT scans provide greater clarity and reveal more details than regular x-ray exams.  Please arrive at the [facility] main entrance of [facility] at xx:xx AM (30-45 minutes prior to test start time)  [facility] [place], [place] [address] [phone]  Proceed to the [facility] Radiology Department (First Floor).  Please follow these instructions carefully (unless otherwise directed):   On the Night Before the Test: . Be sure to Drink plenty of water. . Do not consume any caffeinated/decaffeinated beverages or chocolate 12 hours prior to your test. . Do not take any antihistamines 12 hours prior to  your test. . If you take Metformin do not take 24 hours prior to test.   On the Day of the Test: . Drink plenty of water. Do not drink any water within one hour of the test. . Do not eat any food 4 hours prior to the test. . You may take your regular medications prior to the test.  . Take metoprolol (Lopressor) two hours prior to test if HR is 55 or greater  After the Test: . Drink plenty of water. . After receiving IV contrast, you may experience a mild flushed feeling. This is normal. . On occasion, you may experience a mild rash up to 24 hours after the test. This is not dangerous. If this occurs, you can take Benadryl 25 mg and increase your fluid intake. . If you experience trouble breathing, this can be serious. If it is severe call 911 IMMEDIATELY. If it is mild, please call our office.   Follow-Up: At [facility], you and your health needs are our priority.  As part of our continuing mission to provide you with exceptional heart care, we have created designated Provider Care Teams.  These Care Teams include your primary Cardiologist (physician) and Advanced Practice Providers (APPs -  Physician Assistants and Nurse Practitioners) who all work together to provide you with the care you need, when you need it. You will need a follow up appointment in 3 months.    Any Other Special Instructions Will Be Listed Below  Metoprolol tablets What is this medicine?  METOPROLOL (me TOE proe lole) is a beta-blocker. Beta-blockers reduce the workload on the heart and help it to beat more regularly. This medicine is used to treat high blood pressure and to prevent chest pain. It is also used to after a heart attack and to prevent an additional heart attack from occurring. This medicine may be used for other purposes; ask your health care provider or pharmacist if you have questions. COMMON BRAND NAME(S): Lopressor What should I tell my health care provider before I take this medicine? They need  to know if you have any of these conditions: -diabetes -heart or vessel disease like slow heart rate, worsening heart failure, heart block, sick sinus syndrome or Raynaud's disease -kidney disease -liver disease -lung or breathing disease, like asthma or emphysema -pheochromocytoma -thyroid disease -an unusual or allergic reaction to metoprolol, other beta-blockers, medicines, foods, dyes, or preservatives -pregnant or trying to get pregnant -breast-feeding How should I use this medicine? Take this medicine by mouth with a drink of water. Follow the directions on the prescription label. Take this medicine immediately after meals. Take your doses at regular intervals. Do not take more medicine than directed. Do not stop taking this medicine suddenly. This could lead to serious heart-related effects. Talk to your pediatrician regarding the use of this medicine in children. Special care may be needed. Overdosage: If you think you have taken too much of this medicine contact a poison control center or emergency room at once. NOTE: This medicine is only for you. Do not share this medicine with others. What if I miss a dose? If you miss a dose, take it as soon as you can. If it is almost time for your next dose, take only that dose. Do not take double or extra doses. What may interact with this medicine? This medicine may interact with the following medications: -certain medicines for blood pressure, heart disease, irregular heart beat -certain medicines for depression like monoamine oxidase (MAO) inhibitors, fluoxetine, or paroxetine -clonidine -dobutamine -epinephrine -isoproterenol -reserpine This list may not describe all possible interactions. Give your health care provider a list of all the medicines, herbs, non-prescription drugs, or dietary supplements you use. Also tell them if you smoke, drink alcohol, or use illegal drugs. Some items may interact with your medicine. What should I  watch for while using this medicine? Visit your doctor or health care professional for regular check ups. Contact your doctor right away if your symptoms worsen. Check your blood pressure and pulse rate regularly. Ask your health care professional what your blood pressure and pulse rate should be, and when you should contact them. You may get drowsy or dizzy. Do not drive, use machinery, or do anything that needs mental alertness until you know how this medicine affects you. Do not sit or stand up quickly, especially if you are an older patient. This reduces the risk of dizzy or fainting spells. Contact your doctor if these symptoms continue. Alcohol may interfere with the effect of this medicine. Avoid alcoholic drinks. What side effects may I notice from receiving this medicine? Side effects that you should report to your doctor or health care professional as soon as possible: -allergic reactions like skin rash, itching or hives -cold or numb hands or feet -depression -difficulty breathing -faint -fever with sore throat -irregular heartbeat, chest pain -rapid weight gain -swollen legs or ankles Side effects that usually do not require medical attention (report to your doctor or health care professional if they continue or are  bothersome): -anxiety or nervousness -change in sex drive or performance -dry skin -headache -nightmares or trouble sleeping -short term memory loss -stomach upset or diarrhea -unusually tired This list may not describe all possible side effects. Call your doctor for medical advice about side effects. You may report side effects to FDA at 1-800-FDA-1088. Where should I keep my medicine? Keep out of the reach of children. Store at room temperature between 15 and 30 degrees C (59 and 86 degrees F). Throw away any unused medicine after the expiration date. NOTE: This sheet is a summary. It may not cover all possible information. If you have questions about this  medicine, talk to your doctor, pharmacist, or health care provider.  2019 Elsevier/Gold Standard (2013-04-09 14:40:36)    CT Angiogram  A CT angiogram is a procedure to look at the blood vessels in various areas of the body. For this procedure, a large X-ray machine, called a CT scanner, takes detailed pictures of blood vessels that have been injected with a dye (contrast material). A CT angiogram allows your health care provider to see how well blood is flowing to the area of your body that is being checked. Your health care provider will be able to see if there are any problems, such as a blockage. Tell a health care provider about:  Any allergies you have.  All medicines you are taking, including vitamins, herbs, eye drops, creams, and over-the-counter medicines.  Any problems you or family members have had with anesthetic medicines.  Any blood disorders you have.  Any surgeries you have had.  Any medical conditions you have.  Whether you are pregnant or may be pregnant.  Whether you are breastfeeding.  Any anxiety disorders, chronic pain, or other conditions you have that may increase your stress or prevent you from lying still. What are the risks? Generally, this is a safe procedure. However, problems may occur, including:  Infection.  Bleeding.  Allergic reactions to medicines or dyes.  Damage to other structures or organs.  Kidney damage from the dye or contrast that is used.  Increased risk of cancer from radiation exposure. This risk is low. Talk with your health care provider about: ? The risks and benefits of testing. ? How you can receive the lowest dose of radiation. What happens before the procedure?  Wear comfortable clothing and remove any jewelry.  Follow instructions from your health care provider about eating and drinking. For most people, instructions may include these actions: ? For 12 hours before the test, avoid caffeine. This includes tea,  coffee, soda, and energy drinks or pills. ? For 3-4 hours before the test, stop eating or drinking anything but water. ? Stay well hydrated by continuing to drink water before the exam. This will help to clear the contrast dye from your body after the test.  Ask your health care provider about changing or stopping your regular medicines. This is especially important if you are taking diabetes medicines or blood thinners. What happens during the procedure?  An IV tube will be inserted into one of your veins.  You will be asked to lie on an exam table. This table will slide in and out of the CT machine during the procedure.  Contrast dye will be injected into the IV tube. You might feel warm, or you may get a metallic taste in your mouth.  The table that you are lying on will move into the CT machine tunnel for the scan.  The person running the  machine will give you instructions while the scans are being done. You may be asked to: ? Keep your arms above your head. ? Hold your breath. ? Stay very still, even if the table is moving.  When the scanning is complete, you will be moved out of the machine.  The IV tube will be removed. The procedure may vary among health care providers and hospitals. What happens after the procedure?  You might feel warm, or you may get a metallic taste in your mouth.  You may be asked to drink water or other fluids to wash (flush) the contrast material out of your body.  It is up to you to get the results of your procedure. Ask your health care provider, or the department that is doing the procedure, when your results will be ready. Summary  A CT angiogram is a procedure to look at the blood vessels in various areas of the body.  You will need to stay very still during the exam.  You may be asked to drink water or other fluids to wash (flush) the contrast material out of your body after your scan. This information is not intended to replace advice given  to you by your health care provider. Make sure you discuss any questions you have with your health care provider. Document Revised: 10/15/2018 Document Reviewed: 04/04/2016 Elsevier Patient Education  [place]. Nitroglycerin sublingual tablets What is this medicine? NITROGLYCERIN (nye troe GLI ser in) is a type of vasodilator. It relaxes blood vessels, increasing the blood and oxygen supply to your heart. This medicine is used to relieve chest pain caused by angina. It is also used to prevent chest pain before activities like climbing stairs, going outdoors in cold weather, or sexual activity. This medicine may be used for other purposes; ask your health care provider or pharmacist if you have questions. COMMON BRAND NAME(S): Nitroquick, Nitrostat, Nitrotab What should I tell my health care provider before I take this medicine? They need to know if you have any of these conditions:  anemia  head injury, recent stroke, or bleeding in the brain  liver disease  previous heart attack  an unusual or allergic reaction to nitroglycerin, other medicines, foods, dyes, or preservatives  pregnant or trying to get pregnant  breast-feeding How should I use this medicine? Take this medicine by mouth as needed. At the first sign of an angina attack (chest pain or tightness) place one tablet under your tongue. You can also take this medicine 5 to 10 minutes before an event likely to produce chest pain. Follow the directions on the prescription label. Let the tablet dissolve under the tongue. Do not swallow whole. Replace the dose if you accidentally swallow it. It will help if your mouth is not dry. Saliva around the tablet will help it to dissolve more quickly. Do not eat or drink, smoke or chew tobacco while a tablet is dissolving. If you are not better within 5 minutes after taking ONE dose of nitroglycerin, call 9-1-1 immediately to seek emergency medical care. Do not take more than 3  nitroglycerin tablets over 15 minutes. If you take this medicine often to relieve symptoms of angina, your doctor or health care professional may provide you with different instructions to manage your symptoms. If symptoms do not go away after following these instructions, it is important to call 9-1-1 immediately. Do not take more than 3 nitroglycerin tablets over 15 minutes. Talk to your pediatrician regarding the use of this  medicine in children. Special care may be needed. Overdosage: If you think you have taken too much of this medicine contact a poison control center or emergency room at once. NOTE: This medicine is only for you. Do not share this medicine with others. What if I miss a dose? This does not apply. This medicine is only used as needed. What may interact with this medicine? Do not take this medicine with any of the following medications:  certain migraine medicines like ergotamine and dihydroergotamine (DHE)  medicines used to treat erectile dysfunction like sildenafil, tadalafil, and vardenafil  riociguat This medicine may also interact with the following medications:  alteplase  aspirin  heparin  medicines for high blood pressure  medicines for mental depression  other medicines used to treat angina  phenothiazines like chlorpromazine, mesoridazine, prochlorperazine, thioridazine This list may not describe all possible interactions. Give your health care provider a list of all the medicines, herbs, non-prescription drugs, or dietary supplements you use. Also tell them if you smoke, drink alcohol, or use illegal drugs. Some items may interact with your medicine. What should I watch for while using this medicine? Tell your doctor or health care professional if you feel your medicine is no longer working. Keep this medicine with you at all times. Sit or lie down when you take your medicine to prevent falling if you feel dizzy or faint after using it. Try to remain  calm. This will help you to feel better faster. If you feel dizzy, take several deep breaths and lie down with your feet propped up, or bend forward with your head resting between your knees. You may get drowsy or dizzy. Do not drive, use machinery, or do anything that needs mental alertness until you know how this drug affects you. Do not stand or sit up quickly, especially if you are an older patient. This reduces the risk of dizzy or fainting spells. Alcohol can make you more drowsy and dizzy. Avoid alcoholic drinks. Do not treat yourself for coughs, colds, or pain while you are taking this medicine without asking your doctor or health care professional for advice. Some ingredients may increase your blood pressure. What side effects may I notice from receiving this medicine? Side effects that you should report to your doctor or health care professional as soon as possible:  blurred vision  dry mouth  skin rash  sweating  the feeling of extreme pressure in the head  unusually weak or tired Side effects that usually do not require medical attention (report to your doctor or health care professional if they continue or are bothersome):  flushing of the face or neck  headache  irregular heartbeat, palpitations  nausea, vomiting This list may not describe all possible side effects. Call your doctor for medical advice about side effects. You may report side effects to FDA at 1-800-FDA-1088. Where should I keep my medicine? Keep out of the reach of children. Store at room temperature between 20 and 25 degrees C (68 and 77 degrees F). Store in [facility]. Protect from light and moisture. Keep tightly closed. Throw away any unused medicine after the expiration date. NOTE: This sheet is a summary. It may not cover all possible information. If you have questions about this medicine, talk to your doctor, pharmacist, or health care provider.  2020 Elsevier/Gold Standard (2013-06-03  17:57:36)

## 2019-08-27 DIAGNOSIS — R079 Chest pain, unspecified: Secondary | ICD-10-CM | POA: Diagnosis not present

## 2019-08-27 DIAGNOSIS — D72829 Elevated white blood cell count, unspecified: Secondary | ICD-10-CM | POA: Diagnosis not present

## 2019-08-27 DIAGNOSIS — D473 Essential (hemorrhagic) thrombocythemia: Secondary | ICD-10-CM | POA: Diagnosis not present

## 2019-08-27 DIAGNOSIS — D539 Nutritional anemia, unspecified: Secondary | ICD-10-CM | POA: Diagnosis not present

## 2019-08-27 DIAGNOSIS — R0600 Dyspnea, unspecified: Secondary | ICD-10-CM | POA: Diagnosis not present

## 2019-08-27 DIAGNOSIS — D708 Other neutropenia: Secondary | ICD-10-CM | POA: Diagnosis not present

## 2019-09-13 DIAGNOSIS — J309 Allergic rhinitis, unspecified: Secondary | ICD-10-CM | POA: Diagnosis not present

## 2019-09-13 DIAGNOSIS — C4490 Unspecified malignant neoplasm of skin, unspecified: Secondary | ICD-10-CM | POA: Diagnosis not present

## 2019-09-13 DIAGNOSIS — E663 Overweight: Secondary | ICD-10-CM | POA: Diagnosis not present

## 2019-09-13 DIAGNOSIS — Z6829 Body mass index (BMI) 29.0-29.9, adult: Secondary | ICD-10-CM | POA: Diagnosis not present

## 2019-09-13 DIAGNOSIS — R911 Solitary pulmonary nodule: Secondary | ICD-10-CM | POA: Diagnosis not present

## 2019-09-13 DIAGNOSIS — M7989 Other specified soft tissue disorders: Secondary | ICD-10-CM | POA: Diagnosis not present

## 2019-09-13 DIAGNOSIS — G4730 Sleep apnea, unspecified: Secondary | ICD-10-CM | POA: Diagnosis not present

## 2019-09-13 DIAGNOSIS — M545 Low back pain: Secondary | ICD-10-CM | POA: Diagnosis not present

## 2019-09-13 DIAGNOSIS — K219 Gastro-esophageal reflux disease without esophagitis: Secondary | ICD-10-CM | POA: Diagnosis not present

## 2019-09-13 DIAGNOSIS — E669 Obesity, unspecified: Secondary | ICD-10-CM | POA: Diagnosis not present

## 2019-09-13 DIAGNOSIS — I5189 Other ill-defined heart diseases: Secondary | ICD-10-CM | POA: Diagnosis not present

## 2019-09-13 DIAGNOSIS — R251 Tremor, unspecified: Secondary | ICD-10-CM | POA: Diagnosis not present

## 2019-09-21 DIAGNOSIS — D485 Neoplasm of uncertain behavior of skin: Secondary | ICD-10-CM | POA: Diagnosis not present

## 2019-10-04 DIAGNOSIS — I10 Essential (primary) hypertension: Secondary | ICD-10-CM | POA: Diagnosis not present

## 2019-10-04 DIAGNOSIS — I209 Angina pectoris, unspecified: Secondary | ICD-10-CM | POA: Diagnosis not present

## 2019-10-05 LAB — BASIC METABOLIC PANEL
BUN/Creatinine Ratio: 14 (ref 12–28)
BUN: 16 mg/dL (ref 8–27)
CO2: 23 mmol/L (ref 20–29)
Calcium: 9.2 mg/dL (ref 8.7–10.3)
Chloride: 103 mmol/L (ref 96–106)
Creatinine, Ser: 1.14 mg/dL — ABNORMAL HIGH (ref 0.57–1.00)
GFR calc Af Amer: 53 mL/min/{1.73_m2} — ABNORMAL LOW (ref >59)
GFR calc non Af Amer: 46 mL/min/{1.73_m2} — ABNORMAL LOW (ref >59)
Glucose: 89 mg/dL (ref 65–99)
Potassium: 4.5 mmol/L (ref 3.5–5.2)
Sodium: 141 mmol/L (ref 134–144)

## 2019-10-11 ENCOUNTER — Telehealth (HOSPITAL_COMMUNITY): Payer: Self-pay | Admitting: Emergency Medicine

## 2019-10-11 NOTE — Telephone Encounter (Signed)
Left message on voicemail with name and callback number Tomeika Weinmann RN Navigator Cardiac Imaging [facility] [phone] Office [phone] Cell  

## 2019-10-12 ENCOUNTER — Ambulatory Visit (HOSPITAL_COMMUNITY)
Admission: RE | Admit: 2019-10-12 | Discharge: 2019-10-12 | Disposition: A | Discharge status: Home / Self Care | Payer: PPO | Source: Ambulatory Visit | Attending: Cardiology | Admitting: Cardiology

## 2019-10-12 ENCOUNTER — Other Ambulatory Visit: Payer: Self-pay

## 2019-10-12 ENCOUNTER — Encounter (HOSPITAL_COMMUNITY): Payer: Self-pay

## 2019-10-12 DIAGNOSIS — I2510 Atherosclerotic heart disease of native coronary artery without angina pectoris: Secondary | ICD-10-CM | POA: Insufficient documentation

## 2019-10-12 DIAGNOSIS — K449 Diaphragmatic hernia without obstruction or gangrene: Secondary | ICD-10-CM | POA: Diagnosis not present

## 2019-10-12 DIAGNOSIS — R918 Other nonspecific abnormal finding of lung field: Secondary | ICD-10-CM | POA: Insufficient documentation

## 2019-10-12 DIAGNOSIS — I700 Atherosclerosis of aorta: Secondary | ICD-10-CM | POA: Insufficient documentation

## 2019-10-12 DIAGNOSIS — R072 Precordial pain: Secondary | ICD-10-CM

## 2019-10-12 IMAGING — CT CT HEART MORP W/ CTA COR W/ SCORE W/ CA W/CM &/OR W/O CM
4 of 7 series · 8 of 20 positions shown, 9 images · IV contrast (APPLIED)
Comparison: [DATE] chest CT from [REDACTED].
COMPARISON: [DATE] chest CT from [REDACTED].

Addendum:
EXAM:
OVER-READ INTERPRETATION  CT CHEST

The following report is an over-read performed by radiologist Dr.
FALLON [REDACTED] on [DATE]. This over-read
does not include interpretation of cardiac or coronary anatomy or
pathology. The coronary CTA interpretation by the cardiologist is
attached.
CLINICAL DATA: 78F with chest pain
Cardiac/Coronary CTA
TECHNIQUE: The patient was scanned on a [employer] Force scanner.

[Series 6: best diast 72 % · axial · 0.39mm/px · z∈[+1190,+1231]mm · 2 of 313 slices shown, 3 images]
[im 105/313  vessel]
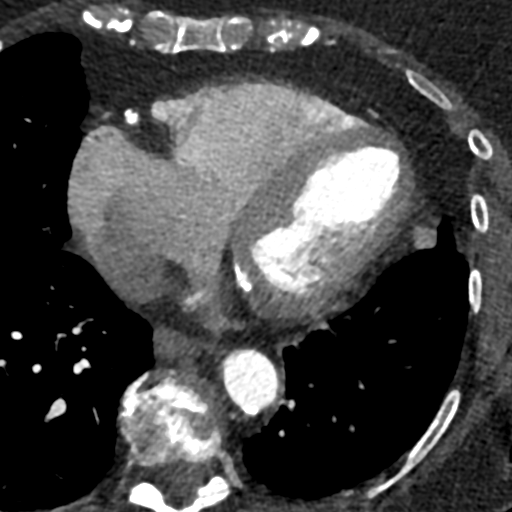
[im 105/313  lung]
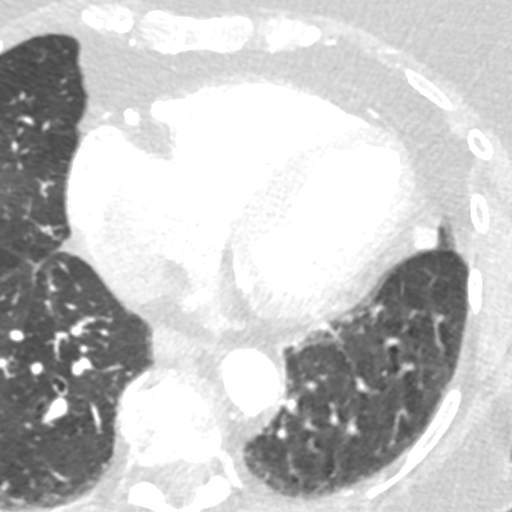
[im 209/313  vessel]
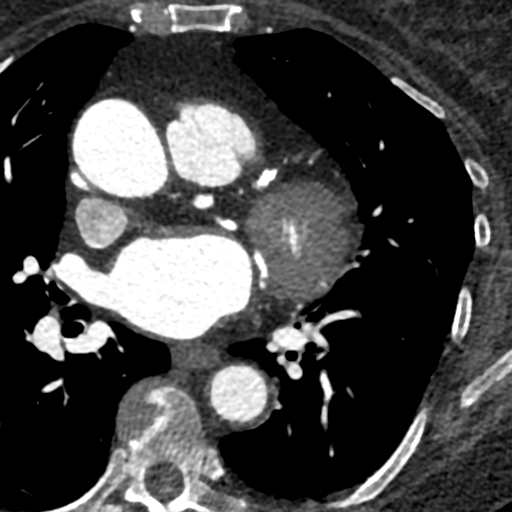

[Series 7: best syst 39 % · axial · 0.39mm/px · z∈[+1190,+1231]mm · 2 of 313 slices shown]
[im 105/313  vessel]
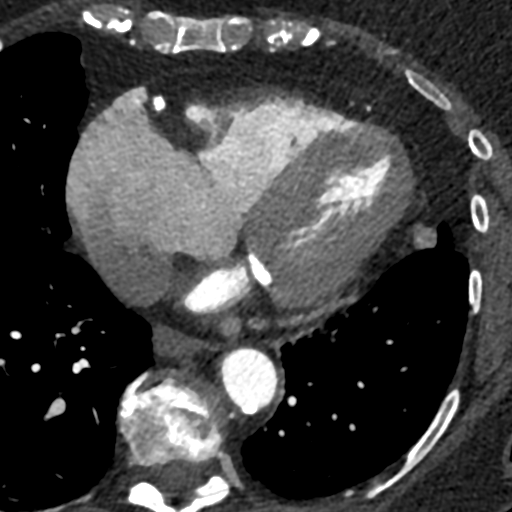
[im 209/313  vessel]
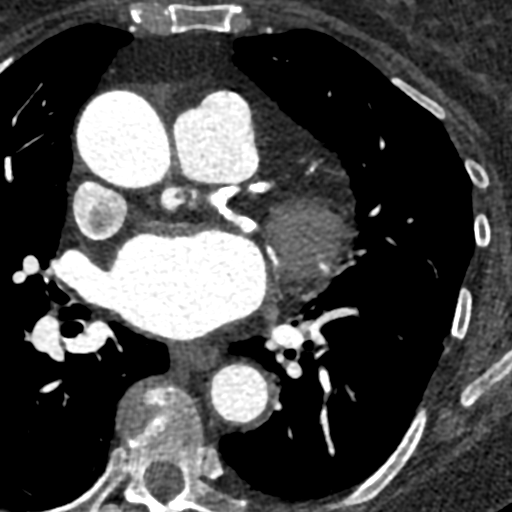

[Series 8: ts diast sharp 39 % · axial · 0.39mm/px · z∈[+1190,+1231]mm · 2 of 313 slices shown]
[im 105/313  lung]
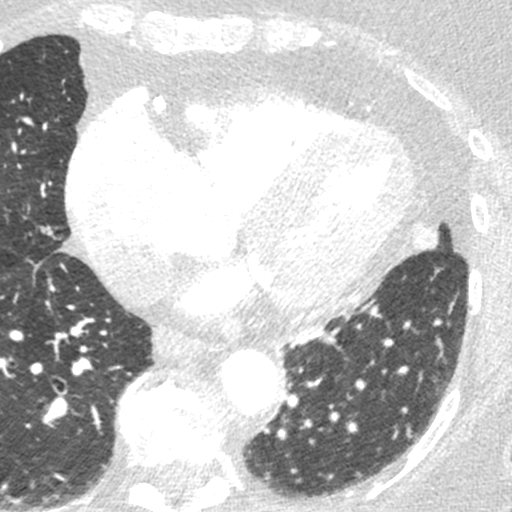
[im 209/313  lung]
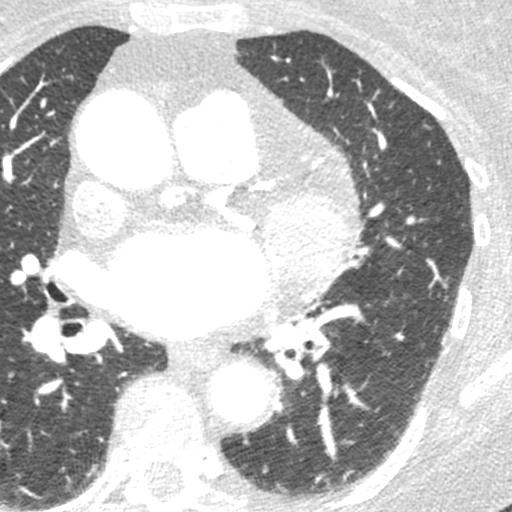

[Series 9: ts syst sharp 39 % · axial · 0.39mm/px · z∈[+1190,+1231]mm · 2 of 313 slices shown]
[im 105/313  lung]
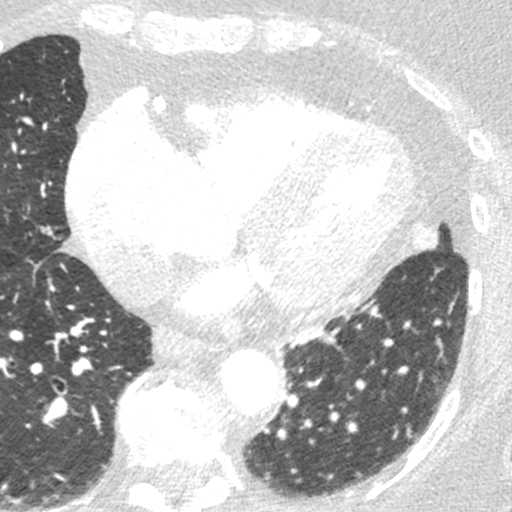
[im 209/313  lung]
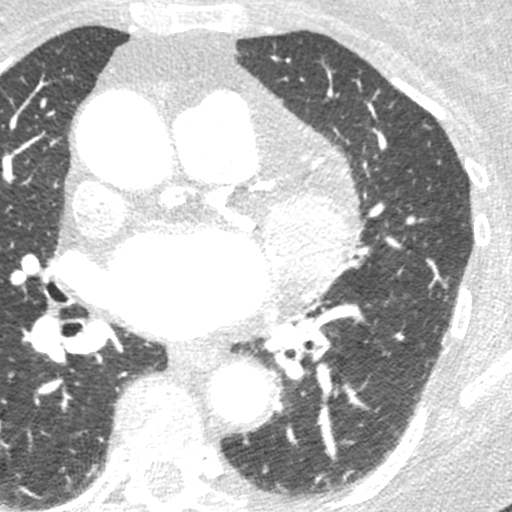

[8 of 20 positions shown; findings below may reference images not displayed]

FINDINGS: Vascular: Aortic atherosclerosis. Tortuous thoracic aorta. No
central pulmonary embolism, on this non-dedicated study.

Mediastinum/Nodes: No imaged thoracic adenopathy. Small hiatal
hernia.

Lungs/Pleura: No pleural fluid. Round, well-circumscribed 5 mm right
upper lobe pulmonary nodule is similar on [DATE]. inferior right
middle lobe 4 mm nodule on [DATE] is also not significantly changed.

Upper Abdomen: Normal imaged portions of the liver, spleen.

Musculoskeletal: No acute osseous abnormality.
IMPRESSION: 1.  No acute findings in the imaged extracardiac chest.
2. Bilateral pulmonary nodules are similar back to [DATE] and
can be presumed benign.
3.  Aortic Atherosclerosis ([AU]-[AU]).
4. Small hiatal hernia.
FINDINGS: A 100 kV prospective scan was triggered in the descending thoracic
aorta at 111 HU's. Axial non-contrast 3 mm slices were carried out
through the heart. The data set was analyzed on a dedicated work
station and scored using the Agatson method. Gantry rotation speed
was 250 msecs and collimation was .6 mm. 0.8 mg of sl NTG was given.
The 3D data set was reconstructed in 5% intervals of the 67-82 % of
the R-R cycle. Diastolic phases were analyzed on a dedicated work
station using MPR, MIP and VRT modes. The patient received 80 cc of
contrast.

Coronary Arteries:  Normal coronary origin.  Right dominance.

RCA is a large dominant artery that gives rise to PDA and PLA. There
is calcified plaque in the proximal to mid RCA that causes moderate
(50-69%) stenosis. CTFFR 0.97. Calcified plaque in the distal RCA
causes mild (25-49%) stenosis.

Left main is a large artery that gives rise to LAD and LCX arteries.
Calcified plaque in the distal left main causes minimal (0-24%)
stenosis.

LAD is a large vessel. There is calcified plaque in the proximal LAD
that causes mild (25-49%) stenosis. CTFFR across lesion is 0.95.
Calcified plaque in the mid LAD causes moderate (50-69%) stenosis.
CTFFR is 0.89 across lesion. Calcified plaque appears to cause
severe stenosis in proximal region of both D1 and D2 (these side
branches were not modeled on CTFFR).

LCX is a non-dominant artery that gives rise to one large OM1
branch. There is calcified plaque in the proximal LCX causing
moderate (50-69%) stenosis. CTFFR across lesion is 0.92. Calcified
plaque in the proximal OM1 appears to cause severe (70-99%)
stenosis. CTFFR across lesion is 0.85.

Other findings:

Left Ventricle: Normal size

Left Atrium: Mild enlargement

Pulmonary Veins: Normal configuration

Right Ventricle: Mild dilatation

Right Atrium: Mild enlargement

Cardiac valves: Mild AV calcification. Moderate mitral annular
calcifications

Thoracic aorta: Normal size

Pulmonary Arteries: Normal size

Systemic Veins: Normal drainage

Pericardium: Normal thickness
IMPRESSION: 1. Coronary calcium score of [AU]. This was 99 percentile for age
and sex matched control.

2. Multivessel coronary artery disease, with diffuse calcified
plaques

3. Calcified plaque in the proximal OM1 appears to cause severe
(70-99%) stenosis. However, CTFFR across lesion is 0.85, suggesting
no functional significance. Calcified plaque in the proximal LCX
causes moderate (50-69%) stenosis. CTFFR across lesion is 0.92,
suggesting no functional significance.

4. Calcified plaque in the mid LAD causes moderate (50-69%)
stenosis. CTFFR is 0.89 across lesion, suggesting no functional
significance

5. Calcified plaque in the proximal to mid RCA causes moderate
(50-69%) stenosis. CTFFR is 0.97, suggesting no functional
significance

6. While CTFFR suggests no significant lesions in the major
arteries, both D1 and D2 were not modeled on CTFFR. There is
calcified plaque in the proximal region of both D1 and D2 that
appears to cause severe stenosis, though could represent blooming
artifact due to severe calcifications. Obstructive CAD cannot be
excluded.

*** End of Addendum ***
EXAM:
OVER-READ INTERPRETATION  CT CHEST

The following report is an over-read performed by radiologist Dr.
FALLON [REDACTED] on [DATE]. This over-read
does not include interpretation of cardiac or coronary anatomy or
pathology. The coronary CTA interpretation by the cardiologist is
attached.
FINDINGS: Vascular: Aortic atherosclerosis. Tortuous thoracic aorta. No
central pulmonary embolism, on this non-dedicated study.

Mediastinum/Nodes: No imaged thoracic adenopathy. Small hiatal
hernia.

Lungs/Pleura: No pleural fluid. Round, well-circumscribed 5 mm right
upper lobe pulmonary nodule is similar on [DATE]. inferior right
middle lobe 4 mm nodule on [DATE] is also not significantly changed.

Upper Abdomen: Normal imaged portions of the liver, spleen.

Musculoskeletal: No acute osseous abnormality.
IMPRESSION: 1.  No acute findings in the imaged extracardiac chest.
2. Bilateral pulmonary nodules are similar back to [DATE] and
can be presumed benign.
3.  Aortic Atherosclerosis ([AU]-[AU]).
4. Small hiatal hernia.

## 2019-10-12 MED ORDER — NITROGLYCERIN 0.4 MG SL SUBL
SUBLINGUAL_TABLET | SUBLINGUAL | Status: AC
  Filled 2019-10-12: qty 2

## 2019-10-12 MED ORDER — NITROGLYCERIN 0.4 MG SL SUBL
0.8000 mg | SUBLINGUAL_TABLET | Freq: Once | SUBLINGUAL | Status: AC
  Administered 2019-10-12: 12:00:00 0.8 mg via SUBLINGUAL

## 2019-10-12 MED ORDER — IOHEXOL 300 MG/ML  SOLN
100.0000 mL | Freq: Once | INTRAMUSCULAR | Status: AC | PRN
  Administered 2019-10-12: 100 mL via INTRAVENOUS

## 2019-10-13 DIAGNOSIS — R072 Precordial pain: Secondary | ICD-10-CM | POA: Diagnosis not present

## 2019-10-15 DIAGNOSIS — Z1231 Encounter for screening mammogram for malignant neoplasm of breast: Secondary | ICD-10-CM | POA: Diagnosis not present

## 2019-10-18 DIAGNOSIS — I5189 Other ill-defined heart diseases: Secondary | ICD-10-CM | POA: Diagnosis not present

## 2019-10-18 DIAGNOSIS — M7989 Other specified soft tissue disorders: Secondary | ICD-10-CM | POA: Diagnosis not present

## 2019-10-18 DIAGNOSIS — M545 Low back pain: Secondary | ICD-10-CM | POA: Diagnosis not present

## 2019-10-18 DIAGNOSIS — E663 Overweight: Secondary | ICD-10-CM | POA: Diagnosis not present

## 2019-10-18 DIAGNOSIS — E669 Obesity, unspecified: Secondary | ICD-10-CM | POA: Diagnosis not present

## 2019-10-18 DIAGNOSIS — I10 Essential (primary) hypertension: Secondary | ICD-10-CM | POA: Diagnosis not present

## 2019-10-18 DIAGNOSIS — I700 Atherosclerosis of aorta: Secondary | ICD-10-CM | POA: Diagnosis not present

## 2019-10-18 DIAGNOSIS — J309 Allergic rhinitis, unspecified: Secondary | ICD-10-CM | POA: Diagnosis not present

## 2019-10-18 DIAGNOSIS — C4490 Unspecified malignant neoplasm of skin, unspecified: Secondary | ICD-10-CM | POA: Diagnosis not present

## 2019-10-18 DIAGNOSIS — Z6829 Body mass index (BMI) 29.0-29.9, adult: Secondary | ICD-10-CM | POA: Diagnosis not present

## 2019-10-25 ENCOUNTER — Encounter: Payer: Self-pay | Admitting: Cardiology

## 2019-10-25 ENCOUNTER — Other Ambulatory Visit: Payer: Self-pay | Admitting: Cardiology

## 2019-10-25 ENCOUNTER — Ambulatory Visit: Payer: PPO | Admitting: Cardiology

## 2019-10-25 ENCOUNTER — Other Ambulatory Visit: Payer: Self-pay

## 2019-10-25 VITALS — BP 150/96 | HR 90 | Ht 62.5 in | Wt 173.0 lb

## 2019-10-25 DIAGNOSIS — I209 Angina pectoris, unspecified: Secondary | ICD-10-CM

## 2019-10-25 DIAGNOSIS — Z1321 Encounter for screening for nutritional disorder: Secondary | ICD-10-CM

## 2019-10-25 DIAGNOSIS — D72829 Elevated white blood cell count, unspecified: Secondary | ICD-10-CM

## 2019-10-25 DIAGNOSIS — R079 Chest pain, unspecified: Secondary | ICD-10-CM | POA: Diagnosis not present

## 2019-10-25 DIAGNOSIS — Z1329 Encounter for screening for other suspected endocrine disorder: Secondary | ICD-10-CM

## 2019-10-25 DIAGNOSIS — R0602 Shortness of breath: Secondary | ICD-10-CM | POA: Diagnosis not present

## 2019-10-25 DIAGNOSIS — I2510 Atherosclerotic heart disease of native coronary artery without angina pectoris: Secondary | ICD-10-CM | POA: Insufficient documentation

## 2019-10-25 DIAGNOSIS — E782 Mixed hyperlipidemia: Secondary | ICD-10-CM

## 2019-10-25 DIAGNOSIS — E559 Vitamin D deficiency, unspecified: Secondary | ICD-10-CM | POA: Diagnosis not present

## 2019-10-25 DIAGNOSIS — R911 Solitary pulmonary nodule: Secondary | ICD-10-CM

## 2019-10-25 DIAGNOSIS — R918 Other nonspecific abnormal finding of lung field: Secondary | ICD-10-CM

## 2019-10-25 DIAGNOSIS — I10 Essential (primary) hypertension: Secondary | ICD-10-CM | POA: Diagnosis not present

## 2019-10-25 HISTORY — DX: Atherosclerotic heart disease of native coronary artery without angina pectoris: I25.10

## 2019-10-25 NOTE — Progress Notes (Signed)
Cardiology Office Note:    Date:  10/25/2019   ID:  Yolanda Flowers, DOB [birth year], MRN [id number]  PCP:  [name], MD  Cardiologist:  Jenean Lindau, MD   Referring MD: [name], MD    ASSESSMENT:    1. Angina pectoris ([place])   2. Combined hyperlipidemia   3. Essential hypertension   4. Coronary artery disease involving native coronary artery, angina presence unspecified, unspecified whether native or transplanted heart    PLAN:    In order of problems listed above:  1. Angina pectoris: I discussed my findings with the patient at extensive length.  She was advised to use sublingual nitroglycerin on a as needed basis.  In view of her significant symptoms the following was discussed with her.I discussed coronary angiography and left heart catheterization with the patient at extensive length. Procedure, benefits and potential risks were explained. Patient had multiple questions which were answered to the patient's satisfaction. Patient agreed and consented for the procedure. Further recommendations will be made based on the findings of the coronary angiography. In the interim. The patient has any significant symptoms he knows to go to the nearest emergency room. 2. At this time I do not want to give her any medication such as isosorbide mononitrate for fear of dropping her blood pressure. 3. Mixed dyslipidemia: Diet was emphasized and she will have fasting blood work today. 4. Essential hypertension: Blood pressure stable.  She appears anxious today. 5. She will be seen in follow-up appointment after the coronary angiography.   Medication Adjustments/Labs and Tests Ordered: Current medicines are reviewed at length with the patient today.  Concerns regarding medicines are outlined above.  No orders of the defined types were placed in this encounter.  No orders of the defined types were placed in this encounter.    Chief Complaint  Patient presents with  . Follow-up    2  Months and Discuss CT Results     History of Present Illness:    Yolanda Flowers is a 79 y.o. female.  Patient has past medical history of essential hypertension and dyslipidemia.  She underwent CT coronary angiography and the results are mentioned below.  Her CT scan reports significant amount of calcium and possible obstructive coronary artery disease.  Patient continues to have chest tightness on exertion which is correlating with her symptoms.  She is here for follow-up for the same.  She does have nitroglycerin to be used on a as needed basis.  At the time of my evaluation, the patient is alert awake oriented and in no distress.  Past Medical History:  Diagnosis Date  . Anxiety   . Arthritis   . Asthma   . Bilateral edema of lower extremity   . Complication of anesthesia    difficulty waking  . Congestive heart failure (CHF) ([place])   . COPD (chronic obstructive pulmonary disease) ([place])   . Dyspnea on exertion   . GERD (gastroesophageal reflux disease)   . History of melanoma excision    left greast toe 2015  . History of squamous cell carcinoma excision    face--  multiple excisions  . History of subdural hematoma    2008  . Hypertension   . OSA (obstructive sleep apnea)    intolerant  . Thrombocytosis ([place])    takes hydoxyurea--  MONITORED BY DR Hinton Rao [facility])  . VIN III (vulvar intraepithelial neoplasia III)   . Wears dentures    UPPER AND LOWER PARTIAL  .  Wears glasses     Past Surgical History:  Procedure Laterality Date  . ABDOMINAL HYSTERECTOMY  age 44  . KNEE ARTHROSCOPY Left 2004  . MELANOMA EXCISION  2015   left great toe  . REPAIR PERONEAL TENDONS ANKLE  2004  . SUBDURAL HEMATOMA EVACUATION VIA CRANIOTOMY  2008      week later  post-op  [facility]  . VULVECTOMY N/A 10/24/2015   Procedure: WIDE LOCAL EXCISION OF THE VULVA ;  Surgeon: Everitt Amber, MD;  Location: [place];  Service: Gynecology;  Laterality: N/A;     Current Medications: Current Meds  Medication Sig  . ammonium lactate (LAC-HYDRIN) 12 % lotion   . Ascorbic Acid (VITAMIN C) 1000 MG tablet Take 1,000 mg by mouth daily.  Marland Kitchen aspirin EC 81 MG tablet Take 81 mg by mouth.  . Calcium Carb-Cholecalciferol (CALCIUM 600 + D PO) Take 2 tablets by mouth daily.  . cholecalciferol (VITAMIN D3) 25 MCG (1000 UT) tablet Take 1,000 Units by mouth daily.  Marland Kitchen erythromycin ophthalmic ointment 1 APPLICATION IN LEFT EYE 3 TIMES A DAY  . fluticasone (FLONASE) 50 MCG/ACT nasal spray Place 2 sprays into the nose every evening.   . hydroxyurea (HYDREA) 500 MG capsule Take 500-1,000 mg by mouth 2 (two) times daily. Takes 2 tab every am and 1 tab every pm--  STARTING Thursday 10-12-2015 PT CUT DOWN TO 1 TAB IN AM AND PM PRIOR TO SURGERY 10-24-2015  PER DR ROSSI AND DR Hinton Rao ORDERS  . lansoprazole (PREVACID) 30 MG capsule Take 30 mg by mouth every morning.   Marland Kitchen lisinopril (PRINIVIL,ZESTRIL) 20 MG tablet Take 20 mg by mouth every evening.   . Magnesium 250 MG TABS Take by mouth.  . Multiple Vitamins-Minerals (PRESERVISION AREDS PO) Take by mouth.  . mupirocin ointment (BACTROBAN) 2 %   . nitroGLYCERIN (NITROSTAT) 0.4 MG SL tablet Place 1 tablet (0.4 mg total) under the tongue every 5 (five) minutes as needed.  Marland Kitchen oxyCODONE-acetaminophen (PERCOCET/ROXICET) 5-325 MG tablet Take 1-2 tablets by mouth every 4 (four) hours as needed for severe pain.  . Vitamin D, Ergocalciferol, (DRISDOL) 1.25 MG (50000 UT) CAPS capsule Take 50,000 Units by mouth every 14 (fourteen) days.     Allergies:   Cephalexin, Lactose intolerance (gi), and Penicillins   Social History   Socioeconomic History  . Marital status: Married    Spouse name: Not on file  . Number of children: Not on file  . Years of education: Not on file  . Highest education level: Not on file  Occupational History  . Not on file  Tobacco Use  . Smoking status: Never Smoker  . Smokeless tobacco: Never Used   Substance and Sexual Activity  . Alcohol use: No  . Drug use: No  . Sexual activity: Not on file  Other Topics Concern  . Not on file  Social History Narrative  . Not on file   Social Determinants of Health   Financial Resource Strain:   . Difficulty of Paying Living Expenses: Not on file  Food Insecurity:   . Worried About Charity fundraiser in the Last Year: Not on file  . Ran Out of Food in the Last Year: Not on file  Transportation Needs:   . Lack of Transportation (Medical): Not on file  . Lack of Transportation (Non-Medical): Not on file  Physical Activity:   . Days of Exercise per Week: Not on file  . Minutes of Exercise per  Session: Not on file  Stress:   . Feeling of Stress : Not on file  Social Connections:   . Frequency of Communication with Friends and Family: Not on file  . Frequency of Social Gatherings with Friends and Family: Not on file  . Attends Religious Services: Not on file  . Active Member of Clubs or Organizations: Not on file  . Attends Archivist Meetings: Not on file  . Marital Status: Not on file     Family History: The patient's family history includes Arthritis in her brother, father, mother, and sister; Asthma in her father; Clotting disorder in her mother; Emphysema in her father; Heart disease in her brother, father, mother, and sister; Hypertension in her brother, father, mother, and sister; Kidney failure in her father; Stroke in her father.  ROS:   Please see the history of present illness.    All other systems reviewed and are negative.  EKGs/Labs/Other Studies Reviewed:    The following studies were reviewed today: CLINICAL DATA:  61F with chest pain  EXAM: Cardiac/Coronary CTA  TECHNIQUE: The patient was scanned on a [employer].  FINDINGS: A 100 kV prospective scan was triggered in the descending thoracic aorta at 111 HU's. Axial non-contrast 3 mm slices were carried out through the heart. The  data set was analyzed on a dedicated work station and scored using the [place]. Gantry rotation speed was 250 msecs and collimation was .6 mm. 0.8 mg of sl NTG was given. The 3D data set was reconstructed in 5% intervals of the 67-82 % of the R-R cycle. Diastolic phases were analyzed on a dedicated work station using MPR, MIP and VRT modes. The patient received 80 cc of contrast.  Coronary Arteries:  Normal coronary origin.  Right dominance.  RCA is a large dominant artery that gives rise to PDA and PLA. There is calcified plaque in the proximal to mid RCA that causes moderate (50-69%) stenosis. CTFFR 0.97. Calcified plaque in the distal RCA causes mild (25-49%) stenosis.  Left main is a large artery that gives rise to LAD and LCX arteries. Calcified plaque in the distal left main causes minimal (0-24%) stenosis.  LAD is a large vessel. There is calcified plaque in the proximal LAD that causes mild (25-49%) stenosis. CTFFR across lesion is 0.95. Calcified plaque in the mid LAD causes moderate (50-69%) stenosis. CTFFR is 0.89 across lesion. Calcified plaque appears to cause severe stenosis in proximal region of both D1 and D2 (these side branches were not modeled on CTFFR).  LCX is a non-dominant artery that gives rise to one large OM1 branch. There is calcified plaque in the proximal LCX causing moderate (50-69%) stenosis. CTFFR across lesion is 0.92. Calcified plaque in the proximal OM1 appears to cause severe (70-99%) stenosis. CTFFR across lesion is 0.85.  Other findings:  Left Ventricle: Normal size  Left Atrium: Mild enlargement  Pulmonary Veins: Normal configuration  Right Ventricle: Mild dilatation  Right Atrium: Mild enlargement  Cardiac valves: Mild AV calcification. Moderate mitral annular calcifications  Thoracic aorta: Normal size  Pulmonary Arteries: Normal size  Systemic Veins: Normal drainage  Pericardium: Normal  thickness  IMPRESSION: 1. Coronary calcium score of 3947. This was 41 percentile for age and sex matched control.  2. Multivessel coronary artery disease, with diffuse calcified plaques  3. Calcified plaque in the proximal OM1 appears to cause severe (70-99%) stenosis. However, CTFFR across lesion is 0.85, suggesting no functional significance. Calcified plaque in the proximal LCX  causes moderate (50-69%) stenosis. CTFFR across lesion is 0.92, suggesting no functional significance.  4. Calcified plaque in the mid LAD causes moderate (50-69%) stenosis. CTFFR is 0.89 across lesion, suggesting no functional significance  5. Calcified plaque in the proximal to mid RCA causes moderate (50-69%) stenosis. CTFFR is 0.97, suggesting no functional significance  6. While CTFFR suggests no significant lesions in the major arteries, both D1 and D2 were not modeled on CTFFR. There is calcified plaque in the proximal region of both D1 and D2 that appears to cause severe stenosis, though could represent blooming artifact due to severe calcifications. Obstructive CAD cannot be excluded.   Electronically Signed   By: Oswaldo Milian MD   On: 10/13/2019 22:15    Recent Labs: 02/09/2019: Hemoglobin 11.6; Platelets 204.0; Pro B Natriuretic peptide (BNP) 70.0 10/04/2019: BUN 16; Creatinine, Ser 1.14; Potassium 4.5; Sodium 141  Recent Lipid Panel No results found for: CHOL, TRIG, HDL, CHOLHDL, VLDL, LDLCALC, LDLDIRECT  Physical Exam:    VS:  BP (!) 150/96   Pulse 90   Ht 5' 2.5" (1.588 m)   Wt 173 lb (78.5 kg)   SpO2 99%   BMI 31.14 kg/m     Wt Readings from Last 3 Encounters:  10/25/19 173 lb (78.5 kg)  08/26/19 176 lb (79.8 kg)  06/16/19 174 lb 3.2 oz (79 kg)     GEN: Patient is in no acute distress HEENT: Normal NECK: No JVD; No carotid bruits LYMPHATICS: No lymphadenopathy CARDIAC: Hear sounds regular, 2/6 systolic murmur at the apex. RESPIRATORY:  Clear to  auscultation without rales, wheezing or rhonchi  ABDOMEN: Soft, non-tender, non-distended MUSCULOSKELETAL:  No edema; No deformity  SKIN: Warm and dry NEUROLOGIC:  Alert and oriented x 3 PSYCHIATRIC:  Normal affect   Signed, Jenean Lindau, MD  10/25/2019 9:12 AM    [facility]

## 2019-10-25 NOTE — H&P (View-Only) (Signed)
Cardiology Office Note:    Date:  10/25/2019   ID:  Yolanda Flowers, DOB 11-Oct-1940, MRN [id number]  PCP:  [name], MD  Cardiologist:  Jenean Lindau, MD   Referring MD: [name], MD    ASSESSMENT:    1. Angina pectoris ([place])   2. Combined hyperlipidemia   3. Essential hypertension   4. Coronary artery disease involving native coronary artery, angina presence unspecified, unspecified whether native or transplanted heart    PLAN:    In order of problems listed above:  1. Angina pectoris: I discussed my findings with the patient at extensive length.  She was advised to use sublingual nitroglycerin on a as needed basis.  In view of her significant symptoms the following was discussed with her.I discussed coronary angiography and left heart catheterization with the patient at extensive length. Procedure, benefits and potential risks were explained. Patient had multiple questions which were answered to the patient's satisfaction. Patient agreed and consented for the procedure. Further recommendations will be made based on the findings of the coronary angiography. In the interim. The patient has any significant symptoms he knows to go to the nearest emergency room. 2. At this time I do not want to give her any medication such as isosorbide mononitrate for fear of dropping her blood pressure. 3. Mixed dyslipidemia: Diet was emphasized and she will have fasting blood work today. 4. Essential hypertension: Blood pressure stable.  She appears anxious today. 5. She will be seen in follow-up appointment after the coronary angiography.   Medication Adjustments/Labs and Tests Ordered: Current medicines are reviewed at length with the patient today.  Concerns regarding medicines are outlined above.  No orders of the defined types were placed in this encounter.  No orders of the defined types were placed in this encounter.    Chief Complaint  Patient presents with  . Follow-up    2  Months and Discuss CT Results     History of Present Illness:    Yolanda Flowers is a 79 y.o. female.  Patient has past medical history of essential hypertension and dyslipidemia.  She underwent CT coronary angiography and the results are mentioned below.  Her CT scan reports significant amount of calcium and possible obstructive coronary artery disease.  Patient continues to have chest tightness on exertion which is correlating with her symptoms.  She is here for follow-up for the same.  She does have nitroglycerin to be used on a as needed basis.  At the time of my evaluation, the patient is alert awake oriented and in no distress.  Past Medical History:  Diagnosis Date  . Anxiety   . Arthritis   . Asthma   . Bilateral edema of lower extremity   . Complication of anesthesia    difficulty waking  . Congestive heart failure (CHF) ([place])   . COPD (chronic obstructive pulmonary disease) ([place])   . Dyspnea on exertion   . GERD (gastroesophageal reflux disease)   . History of melanoma excision    left greast toe 2015  . History of squamous cell carcinoma excision    face--  multiple excisions  . History of subdural hematoma    2008  . Hypertension   . OSA (obstructive sleep apnea)    intolerant  . Thrombocytosis ([place])    takes hydoxyurea--  MONITORED BY DR Hinton Rao [facility])  . VIN III (vulvar intraepithelial neoplasia III)   . Wears dentures    UPPER AND LOWER PARTIAL  .  Wears glasses     Past Surgical History:  Procedure Laterality Date  . ABDOMINAL HYSTERECTOMY  age 45  . KNEE ARTHROSCOPY Left 2004  . MELANOMA EXCISION  2015   left great toe  . REPAIR PERONEAL TENDONS ANKLE  2004  . SUBDURAL HEMATOMA EVACUATION VIA CRANIOTOMY  2008      week later  post-op  [facility]  . VULVECTOMY N/A 10/24/2015   Procedure: WIDE LOCAL EXCISION OF THE VULVA ;  Surgeon: Everitt Amber, MD;  Location: [place];  Service: Gynecology;  Laterality: N/A;     Current Medications: Current Meds  Medication Sig  . ammonium lactate (LAC-HYDRIN) 12 % lotion   . Ascorbic Acid (VITAMIN C) 1000 MG tablet Take 1,000 mg by mouth daily.  Marland Kitchen aspirin EC 81 MG tablet Take 81 mg by mouth.  . Calcium Carb-Cholecalciferol (CALCIUM 600 + D PO) Take 2 tablets by mouth daily.  . cholecalciferol (VITAMIN D3) 25 MCG (1000 UT) tablet Take 1,000 Units by mouth daily.  Marland Kitchen erythromycin ophthalmic ointment 1 APPLICATION IN LEFT EYE 3 TIMES A DAY  . fluticasone (FLONASE) 50 MCG/ACT nasal spray Place 2 sprays into the nose every evening.   . hydroxyurea (HYDREA) 500 MG capsule Take 500-1,000 mg by mouth 2 (two) times daily. Takes 2 tab every am and 1 tab every pm--  STARTING Thursday 10-12-2015 PT CUT DOWN TO 1 TAB IN AM AND PM PRIOR TO SURGERY 10-24-2015  PER DR ROSSI AND DR Hinton Rao ORDERS  . lansoprazole (PREVACID) 30 MG capsule Take 30 mg by mouth every morning.   Marland Kitchen lisinopril (PRINIVIL,ZESTRIL) 20 MG tablet Take 20 mg by mouth every evening.   . Magnesium 250 MG TABS Take by mouth.  . Multiple Vitamins-Minerals (PRESERVISION AREDS PO) Take by mouth.  . mupirocin ointment (BACTROBAN) 2 %   . nitroGLYCERIN (NITROSTAT) 0.4 MG SL tablet Place 1 tablet (0.4 mg total) under the tongue every 5 (five) minutes as needed.  Marland Kitchen oxyCODONE-acetaminophen (PERCOCET/ROXICET) 5-325 MG tablet Take 1-2 tablets by mouth every 4 (four) hours as needed for severe pain.  . Vitamin D, Ergocalciferol, (DRISDOL) 1.25 MG (50000 UT) CAPS capsule Take 50,000 Units by mouth every 14 (fourteen) days.     Allergies:   Cephalexin, Lactose intolerance (gi), and Penicillins   Social History   Socioeconomic History  . Marital status: Married    Spouse name: Not on file  . Number of children: Not on file  . Years of education: Not on file  . Highest education level: Not on file  Occupational History  . Not on file  Tobacco Use  . Smoking status: Never Smoker  . Smokeless tobacco: Never Used   Substance and Sexual Activity  . Alcohol use: No  . Drug use: No  . Sexual activity: Not on file  Other Topics Concern  . Not on file  Social History Narrative  . Not on file   Social Determinants of Health   Financial Resource Strain:   . Difficulty of Paying Living Expenses: Not on file  Food Insecurity:   . Worried About Charity fundraiser in the Last Year: Not on file  . Ran Out of Food in the Last Year: Not on file  Transportation Needs:   . Lack of Transportation (Medical): Not on file  . Lack of Transportation (Non-Medical): Not on file  Physical Activity:   . Days of Exercise per Week: Not on file  . Minutes of Exercise per  Session: Not on file  Stress:   . Feeling of Stress : Not on file  Social Connections:   . Frequency of Communication with Friends and Family: Not on file  . Frequency of Social Gatherings with Friends and Family: Not on file  . Attends Religious Services: Not on file  . Active Member of Clubs or Organizations: Not on file  . Attends Archivist Meetings: Not on file  . Marital Status: Not on file     Family History: The patient's family history includes Arthritis in her brother, father, mother, and sister; Asthma in her father; Clotting disorder in her mother; Emphysema in her father; Heart disease in her brother, father, mother, and sister; Hypertension in her brother, father, mother, and sister; Kidney failure in her father; Stroke in her father.  ROS:   Please see the history of present illness.    All other systems reviewed and are negative.  EKGs/Labs/Other Studies Reviewed:    The following studies were reviewed today: CLINICAL DATA:  15F with chest pain  EXAM: Cardiac/Coronary CTA  TECHNIQUE: The patient was scanned on a [employer].  FINDINGS: A 100 kV prospective scan was triggered in the descending thoracic aorta at 111 HU's. Axial non-contrast 3 mm slices were carried out through the heart. The  data set was analyzed on a dedicated work station and scored using the [place]. Gantry rotation speed was 250 msecs and collimation was .6 mm. 0.8 mg of sl NTG was given. The 3D data set was reconstructed in 5% intervals of the 67-82 % of the R-R cycle. Diastolic phases were analyzed on a dedicated work station using MPR, MIP and VRT modes. The patient received 80 cc of contrast.  Coronary Arteries:  Normal coronary origin.  Right dominance.  RCA is a large dominant artery that gives rise to PDA and PLA. There is calcified plaque in the proximal to mid RCA that causes moderate (50-69%) stenosis. CTFFR 0.97. Calcified plaque in the distal RCA causes mild (25-49%) stenosis.  Left main is a large artery that gives rise to LAD and LCX arteries. Calcified plaque in the distal left main causes minimal (0-24%) stenosis.  LAD is a large vessel. There is calcified plaque in the proximal LAD that causes mild (25-49%) stenosis. CTFFR across lesion is 0.95. Calcified plaque in the mid LAD causes moderate (50-69%) stenosis. CTFFR is 0.89 across lesion. Calcified plaque appears to cause severe stenosis in proximal region of both D1 and D2 (these side branches were not modeled on CTFFR).  LCX is a non-dominant artery that gives rise to one large OM1 branch. There is calcified plaque in the proximal LCX causing moderate (50-69%) stenosis. CTFFR across lesion is 0.92. Calcified plaque in the proximal OM1 appears to cause severe (70-99%) stenosis. CTFFR across lesion is 0.85.  Other findings:  Left Ventricle: Normal size  Left Atrium: Mild enlargement  Pulmonary Veins: Normal configuration  Right Ventricle: Mild dilatation  Right Atrium: Mild enlargement  Cardiac valves: Mild AV calcification. Moderate mitral annular calcifications  Thoracic aorta: Normal size  Pulmonary Arteries: Normal size  Systemic Veins: Normal drainage  Pericardium: Normal  thickness  IMPRESSION: 1. Coronary calcium score of 3947. This was 63 percentile for age and sex matched control.  2. Multivessel coronary artery disease, with diffuse calcified plaques  3. Calcified plaque in the proximal OM1 appears to cause severe (70-99%) stenosis. However, CTFFR across lesion is 0.85, suggesting no functional significance. Calcified plaque in the proximal LCX  causes moderate (50-69%) stenosis. CTFFR across lesion is 0.92, suggesting no functional significance.  4. Calcified plaque in the mid LAD causes moderate (50-69%) stenosis. CTFFR is 0.89 across lesion, suggesting no functional significance  5. Calcified plaque in the proximal to mid RCA causes moderate (50-69%) stenosis. CTFFR is 0.97, suggesting no functional significance  6. While CTFFR suggests no significant lesions in the major arteries, both D1 and D2 were not modeled on CTFFR. There is calcified plaque in the proximal region of both D1 and D2 that appears to cause severe stenosis, though could represent blooming artifact due to severe calcifications. Obstructive CAD cannot be excluded.   Electronically Signed   By: Oswaldo Milian MD   On: 10/13/2019 22:15    Recent Labs: 02/09/2019: Hemoglobin 11.6; Platelets 204.0; Pro B Natriuretic peptide (BNP) 70.0 10/04/2019: BUN 16; Creatinine, Ser 1.14; Potassium 4.5; Sodium 141  Recent Lipid Panel No results found for: CHOL, TRIG, HDL, CHOLHDL, VLDL, LDLCALC, LDLDIRECT  Physical Exam:    VS:  BP (!) 150/96   Pulse 90   Ht 5' 2.5" (1.588 m)   Wt 173 lb (78.5 kg)   SpO2 99%   BMI 31.14 kg/m     Wt Readings from Last 3 Encounters:  10/25/19 173 lb (78.5 kg)  08/26/19 176 lb (79.8 kg)  06/16/19 174 lb 3.2 oz (79 kg)     GEN: Patient is in no acute distress HEENT: Normal NECK: No JVD; No carotid bruits LYMPHATICS: No lymphadenopathy CARDIAC: Hear sounds regular, 2/6 systolic murmur at the apex. RESPIRATORY:  Clear to  auscultation without rales, wheezing or rhonchi  ABDOMEN: Soft, non-tender, non-distended MUSCULOSKELETAL:  No edema; No deformity  SKIN: Warm and dry NEUROLOGIC:  Alert and oriented x 3 PSYCHIATRIC:  Normal affect   Signed, Jenean Lindau, MD  10/25/2019 9:12 AM    [facility]

## 2019-10-25 NOTE — Patient Instructions (Signed)
Medication Instructions:  No medication changes *If you need a refill on your cardiac medications before your next appointment, please call your pharmacy*   Lab Work: You had labs drawn today in the office. If you have labs (blood work) drawn today and your tests are completely normal, you will receive your results only by: Marland Kitchen MyChart Message (if you have MyChart) OR . A paper copy in the mail If you have any lab test that is abnormal or we need to change your treatment, we will call you to review the results.   Testing/Procedures:    [place] [address] Dept: [phone] Loc: [facility]  10/25/2019  You are scheduled for a Cardiac Catheterization on Wednesday, March 17 with Dr. Harrell Gave End.  1. Please arrive at the [facility] (Main Entrance A) at [facility]: [place], [place] [address] at 6:30 AM (This time is two hours before your procedure to ensure your preparation). Free valet parking service is available.   Special note: Every effort is made to have your procedure done on time. Please understand that emergencies sometimes delay scheduled procedures.  2. Diet: Do not eat solid foods after midnight.  The patient may have clear liquids until 5am upon the day of the procedure.  3. Labs: You had your labs done today.  4. Medication instructions in preparation for your procedure:   Contrast Allergy: No  Stop taking, Lisinopril (Zestril or Prinivil) Wednesday, March 17,    On the morning of your procedure, take your Aspirin and any morning medicines NOT listed above.  You may use sips of water.  5. Plan for one night stay--bring personal belongings. 6. Bring a current list of your medications and current insurance cards. 7. You MUST have a responsible person to drive you home. 8. Someone MUST be with you the first 24  hours after you arrive home or your discharge will be delayed. 9. Please wear clothes that are easy to get on and off and wear slip-on shoes.  Thank you for allowing [place] to care for you!   -- [facility]   Follow-Up: At [facility], you and your health needs are our priority.  As part of our continuing mission to provide you with exceptional heart care, we have created designated Provider Care Teams.  These Care Teams include your primary Cardiologist (physician) and Advanced Practice Providers (APPs -  Physician Assistants and Nurse Practitioners) who all work together to provide you with the care you need, when you need it.  We recommend signing up for the patient portal called "MyChart".  Sign up information is provided on this After Visit Summary.  MyChart is used to connect with patients for Virtual Visits (Telemedicine).  Patients are able to view lab/test results, encounter notes, upcoming appointments, etc.  Non-urgent messages can be sent to your provider as well.   To learn more about what you can do with MyChart, go to [email or web address].    Your next appointment:   3 week(s)  The format for your next appointment:   In Person  Provider:   Jyl Heinz, MD   Other Instructions NA

## 2019-11-01 ENCOUNTER — Other Ambulatory Visit (HOSPITAL_COMMUNITY)
Admission: RE | Admit: 2019-11-01 | Discharge: 2019-11-01 | Disposition: A | Discharge status: Home / Self Care | Payer: PPO | Source: Ambulatory Visit | Attending: Internal Medicine | Admitting: Internal Medicine

## 2019-11-01 ENCOUNTER — Telehealth: Payer: Self-pay

## 2019-11-01 DIAGNOSIS — I509 Heart failure, unspecified: Secondary | ICD-10-CM | POA: Diagnosis not present

## 2019-11-01 DIAGNOSIS — I081 Rheumatic disorders of both mitral and tricuspid valves: Secondary | ICD-10-CM | POA: Diagnosis not present

## 2019-11-01 DIAGNOSIS — E782 Mixed hyperlipidemia: Secondary | ICD-10-CM | POA: Diagnosis present

## 2019-11-01 DIAGNOSIS — I2510 Atherosclerotic heart disease of native coronary artery without angina pectoris: Secondary | ICD-10-CM | POA: Diagnosis not present

## 2019-11-01 DIAGNOSIS — J449 Chronic obstructive pulmonary disease, unspecified: Secondary | ICD-10-CM | POA: Diagnosis present

## 2019-11-01 DIAGNOSIS — J939 Pneumothorax, unspecified: Secondary | ICD-10-CM | POA: Diagnosis not present

## 2019-11-01 DIAGNOSIS — I25119 Atherosclerotic heart disease of native coronary artery with unspecified angina pectoris: Secondary | ICD-10-CM | POA: Diagnosis not present

## 2019-11-01 DIAGNOSIS — J9811 Atelectasis: Secondary | ICD-10-CM | POA: Diagnosis not present

## 2019-11-01 DIAGNOSIS — Z01812 Encounter for preprocedural laboratory examination: Secondary | ICD-10-CM | POA: Insufficient documentation

## 2019-11-01 DIAGNOSIS — M1990 Unspecified osteoarthritis, unspecified site: Secondary | ICD-10-CM | POA: Diagnosis present

## 2019-11-01 DIAGNOSIS — Z881 Allergy status to other antibiotic agents status: Secondary | ICD-10-CM | POA: Diagnosis not present

## 2019-11-01 DIAGNOSIS — Z85828 Personal history of other malignant neoplasm of skin: Secondary | ICD-10-CM | POA: Diagnosis not present

## 2019-11-01 DIAGNOSIS — K219 Gastro-esophageal reflux disease without esophagitis: Secondary | ICD-10-CM | POA: Diagnosis present

## 2019-11-01 DIAGNOSIS — Z20822 Contact with and (suspected) exposure to covid-19: Secondary | ICD-10-CM | POA: Diagnosis present

## 2019-11-01 DIAGNOSIS — Z85820 Personal history of malignant melanoma of skin: Secondary | ICD-10-CM | POA: Diagnosis not present

## 2019-11-01 DIAGNOSIS — I10 Essential (primary) hypertension: Secondary | ICD-10-CM | POA: Diagnosis present

## 2019-11-01 DIAGNOSIS — M549 Dorsalgia, unspecified: Secondary | ICD-10-CM | POA: Diagnosis present

## 2019-11-01 DIAGNOSIS — Z79891 Long term (current) use of opiate analgesic: Secondary | ICD-10-CM | POA: Diagnosis not present

## 2019-11-01 DIAGNOSIS — Z8249 Family history of ischemic heart disease and other diseases of the circulatory system: Secondary | ICD-10-CM | POA: Diagnosis not present

## 2019-11-01 DIAGNOSIS — Z4682 Encounter for fitting and adjustment of non-vascular catheter: Secondary | ICD-10-CM | POA: Diagnosis not present

## 2019-11-01 DIAGNOSIS — I700 Atherosclerosis of aorta: Secondary | ICD-10-CM | POA: Diagnosis present

## 2019-11-01 DIAGNOSIS — G4733 Obstructive sleep apnea (adult) (pediatric): Secondary | ICD-10-CM | POA: Diagnosis present

## 2019-11-01 DIAGNOSIS — Z01810 Encounter for preprocedural cardiovascular examination: Secondary | ICD-10-CM | POA: Diagnosis not present

## 2019-11-01 DIAGNOSIS — I25110 Atherosclerotic heart disease of native coronary artery with unstable angina pectoris: Secondary | ICD-10-CM | POA: Diagnosis not present

## 2019-11-01 DIAGNOSIS — Z8261 Family history of arthritis: Secondary | ICD-10-CM | POA: Diagnosis not present

## 2019-11-01 DIAGNOSIS — Z7982 Long term (current) use of aspirin: Secondary | ICD-10-CM | POA: Diagnosis not present

## 2019-11-01 DIAGNOSIS — I517 Cardiomegaly: Secondary | ICD-10-CM | POA: Diagnosis not present

## 2019-11-01 DIAGNOSIS — D62 Acute posthemorrhagic anemia: Secondary | ICD-10-CM | POA: Diagnosis not present

## 2019-11-01 DIAGNOSIS — J90 Pleural effusion, not elsewhere classified: Secondary | ICD-10-CM | POA: Diagnosis not present

## 2019-11-01 DIAGNOSIS — E739 Lactose intolerance, unspecified: Secondary | ICD-10-CM | POA: Diagnosis present

## 2019-11-01 DIAGNOSIS — G8929 Other chronic pain: Secondary | ICD-10-CM | POA: Diagnosis present

## 2019-11-01 DIAGNOSIS — R0902 Hypoxemia: Secondary | ICD-10-CM | POA: Diagnosis not present

## 2019-11-01 DIAGNOSIS — Z880 Allergy status to penicillin: Secondary | ICD-10-CM | POA: Diagnosis not present

## 2019-11-01 DIAGNOSIS — Z79899 Other long term (current) drug therapy: Secondary | ICD-10-CM | POA: Diagnosis not present

## 2019-11-01 DIAGNOSIS — R079 Chest pain, unspecified: Secondary | ICD-10-CM | POA: Diagnosis present

## 2019-11-01 LAB — SARS CORONAVIRUS 2 (TAT 6-24 HRS): SARS Coronavirus 2: NEGATIVE

## 2019-11-01 NOTE — Telephone Encounter (Signed)
Pt had labs completed on 10/25/19. Spoke with Shirlean Mylar ([facility]) and she states the labs were completed. Labs will be faxed to [facility] office and sent to Cath lab.

## 2019-11-02 ENCOUNTER — Telehealth: Payer: Self-pay | Admitting: *Deleted

## 2019-11-02 NOTE — Telephone Encounter (Signed)
New Message   Patient daughter called regarding her mother's heart cath tomorrow , she has concerns and would like a call back please

## 2019-11-02 NOTE — Telephone Encounter (Signed)
10/25/19 WBC 17.5 (05/14/19 20.0) Platelet Count 158 (05/14/19 190).  Pt states she is under the care of Dr Hosie Poisson at [facility] for management of platelet disorder treated with hydroxyurea. Pt states she has been told in the past by Dr Hinton Rao that this medication can elevate her WBC and this is monitored closely by Dr Hinton Rao.  Pt denies fever, cough, dysuria, or other signs/symptoms of infection. __ Copied from staff message 11/01/19 Dr Geraldo Pitter: If the patient has no focus or symptoms of infection such as fever or any such issues I think it is fine to proceed. Also it would be a good idea to get a urine analysis. Recent chest evaluation was unremarkable.  ___  I will forward to Dr Geraldo Pitter to see if recommendation is still to get urinalysis.

## 2019-11-02 NOTE — Telephone Encounter (Signed)
Pt contacted pre-catheterization scheduled at [facility] for: Wednesday November 03, 2019 8:30 AM Verified arrival time and place: [place] [name]) at: 6:30 AM   No solid food after midnight prior to cath, clear liquids until 5 AM day of procedure. Contrast allergy: no  AM meds can be  taken pre-cath with sip of water including: ASA 81 mg   Confirmed patient has responsible adult to drive home post procedure and observe 24 hours after arriving home: yes  Currently, due to Covid-19 pandemic, only one person will be allowed with patient. Must be the same person for patient's entire stay and will be required to wear a mask. They will be asked to wait in the waiting room for the duration of the patient's stay.  Patients are required to wear a mask when they enter the hospital.     COVID-19 Pre-Screening Questions:  . In the past 7 to 10 days have you had a cough,  shortness of breath, headache, congestion, fever (100 or greater) body aches, chills, sore throat, or sudden loss of taste or sense of smell? no . Have you been around anyone with known Covid 19 in the past 7-10 days? no . Have you been around anyone who is awaiting Covid 19 test results in the past 7 to 10 days? no . Have you been around anyone who has been exposed to Covid 19, or has mentioned symptoms of Covid 19 within the past 7 to 10 days? no    I reviewed procedure/mask/visitor instructions, COVID-19 screening questions with patient, she verbalized understanding, thanked me for call.

## 2019-11-02 NOTE — Telephone Encounter (Signed)
Not necessary Webb Silversmith. Thanks

## 2019-11-02 NOTE — Telephone Encounter (Signed)
Called and spoke to the pt regarding her daughter having questions. Pt asked me to call her daughter-in-law regarding the visitor policy. I called and left a message for the daughter-in-law.

## 2019-11-03 ENCOUNTER — Inpatient Hospital Stay (HOSPITAL_COMMUNITY)
Admission: RE | Admit: 2019-11-03 | Discharge: 2019-11-10 | DRG: 234 | Disposition: A | Discharge status: Home / Self Care | Payer: PPO | Attending: Surgery | Admitting: Surgery

## 2019-11-03 ENCOUNTER — Inpatient Hospital Stay (HOSPITAL_COMMUNITY): Payer: PPO

## 2019-11-03 ENCOUNTER — Inpatient Hospital Stay (HOSPITAL_COMMUNITY)
Admission: RE | Disposition: A | Discharge status: Home / Self Care | Payer: Self-pay | Source: Home / Self Care | Attending: Surgery

## 2019-11-03 ENCOUNTER — Other Ambulatory Visit: Payer: Self-pay

## 2019-11-03 ENCOUNTER — Encounter (HOSPITAL_COMMUNITY): Payer: Self-pay | Admitting: Internal Medicine

## 2019-11-03 DIAGNOSIS — Z85820 Personal history of malignant melanoma of skin: Secondary | ICD-10-CM

## 2019-11-03 DIAGNOSIS — M1990 Unspecified osteoarthritis, unspecified site: Secondary | ICD-10-CM | POA: Diagnosis not present

## 2019-11-03 DIAGNOSIS — D62 Acute posthemorrhagic anemia: Secondary | ICD-10-CM | POA: Diagnosis not present

## 2019-11-03 DIAGNOSIS — Z01810 Encounter for preprocedural cardiovascular examination: Secondary | ICD-10-CM

## 2019-11-03 DIAGNOSIS — Z7982 Long term (current) use of aspirin: Secondary | ICD-10-CM | POA: Diagnosis not present

## 2019-11-03 DIAGNOSIS — Z79899 Other long term (current) drug therapy: Secondary | ICD-10-CM | POA: Diagnosis not present

## 2019-11-03 DIAGNOSIS — Z20822 Contact with and (suspected) exposure to covid-19: Secondary | ICD-10-CM | POA: Diagnosis present

## 2019-11-03 DIAGNOSIS — I25110 Atherosclerotic heart disease of native coronary artery with unstable angina pectoris: Principal | ICD-10-CM

## 2019-11-03 DIAGNOSIS — R079 Chest pain, unspecified: Secondary | ICD-10-CM | POA: Diagnosis present

## 2019-11-03 DIAGNOSIS — G4733 Obstructive sleep apnea (adult) (pediatric): Secondary | ICD-10-CM | POA: Diagnosis present

## 2019-11-03 DIAGNOSIS — Z951 Presence of aortocoronary bypass graft: Secondary | ICD-10-CM

## 2019-11-03 DIAGNOSIS — Z85828 Personal history of other malignant neoplasm of skin: Secondary | ICD-10-CM

## 2019-11-03 DIAGNOSIS — E782 Mixed hyperlipidemia: Secondary | ICD-10-CM | POA: Diagnosis not present

## 2019-11-03 DIAGNOSIS — J90 Pleural effusion, not elsewhere classified: Secondary | ICD-10-CM

## 2019-11-03 DIAGNOSIS — Z79891 Long term (current) use of opiate analgesic: Secondary | ICD-10-CM | POA: Diagnosis not present

## 2019-11-03 DIAGNOSIS — R0902 Hypoxemia: Secondary | ICD-10-CM | POA: Diagnosis not present

## 2019-11-03 DIAGNOSIS — E739 Lactose intolerance, unspecified: Secondary | ICD-10-CM | POA: Diagnosis present

## 2019-11-03 DIAGNOSIS — K219 Gastro-esophageal reflux disease without esophagitis: Secondary | ICD-10-CM | POA: Diagnosis present

## 2019-11-03 DIAGNOSIS — I2510 Atherosclerotic heart disease of native coronary artery without angina pectoris: Secondary | ICD-10-CM

## 2019-11-03 DIAGNOSIS — Z881 Allergy status to other antibiotic agents status: Secondary | ICD-10-CM | POA: Diagnosis not present

## 2019-11-03 DIAGNOSIS — J449 Chronic obstructive pulmonary disease, unspecified: Secondary | ICD-10-CM

## 2019-11-03 DIAGNOSIS — I200 Unstable angina: Secondary | ICD-10-CM | POA: Diagnosis present

## 2019-11-03 DIAGNOSIS — I700 Atherosclerosis of aorta: Secondary | ICD-10-CM | POA: Diagnosis present

## 2019-11-03 DIAGNOSIS — G8929 Other chronic pain: Secondary | ICD-10-CM | POA: Diagnosis present

## 2019-11-03 DIAGNOSIS — I10 Essential (primary) hypertension: Secondary | ICD-10-CM

## 2019-11-03 DIAGNOSIS — Z8261 Family history of arthritis: Secondary | ICD-10-CM | POA: Diagnosis not present

## 2019-11-03 DIAGNOSIS — I209 Angina pectoris, unspecified: Secondary | ICD-10-CM

## 2019-11-03 DIAGNOSIS — Z8249 Family history of ischemic heart disease and other diseases of the circulatory system: Secondary | ICD-10-CM | POA: Diagnosis not present

## 2019-11-03 DIAGNOSIS — M549 Dorsalgia, unspecified: Secondary | ICD-10-CM | POA: Diagnosis present

## 2019-11-03 DIAGNOSIS — Z880 Allergy status to penicillin: Secondary | ICD-10-CM

## 2019-11-03 DIAGNOSIS — Z09 Encounter for follow-up examination after completed treatment for conditions other than malignant neoplasm: Secondary | ICD-10-CM

## 2019-11-03 DIAGNOSIS — Z01811 Encounter for preprocedural respiratory examination: Secondary | ICD-10-CM

## 2019-11-03 DIAGNOSIS — I509 Heart failure, unspecified: Secondary | ICD-10-CM | POA: Diagnosis not present

## 2019-11-03 HISTORY — PX: INTRAVASCULAR ULTRASOUND/IVUS: CATH118244

## 2019-11-03 HISTORY — PX: LEFT HEART CATH AND CORONARY ANGIOGRAPHY: CATH118249

## 2019-11-03 HISTORY — DX: Atherosclerotic heart disease of native coronary artery without angina pectoris: I25.10

## 2019-11-03 LAB — URINALYSIS, ROUTINE W REFLEX MICROSCOPIC
Bilirubin Urine: NEGATIVE
Glucose, UA: NEGATIVE mg/dL
Ketones, ur: NEGATIVE mg/dL
Nitrite: NEGATIVE
Protein, ur: NEGATIVE mg/dL
Specific Gravity, Urine: 1.03 (ref 1.005–1.030)
pH: 7 (ref 5.0–8.0)

## 2019-11-03 LAB — POCT ACTIVATED CLOTTING TIME
Activated Clotting Time: 235 seconds
Activated Clotting Time: 329 seconds

## 2019-11-03 LAB — ECHOCARDIOGRAM COMPLETE
Height: 65 in
Weight: 2752 oz

## 2019-11-03 LAB — SURGICAL PCR SCREEN
MRSA, PCR: NEGATIVE
Staphylococcus aureus: NEGATIVE

## 2019-11-03 LAB — ABO/RH: ABO/RH(D): O POS

## 2019-11-03 IMAGING — DX DG CHEST 1V PORT
1 series · 1 of 1 positions shown · non-contrast
Comparison: Chest radiograph [DATE] at [facility]

CLINICAL DATA: Preop respiratory exam

EXAM:
PORTABLE CHEST 1 VIEW

[chest ap]
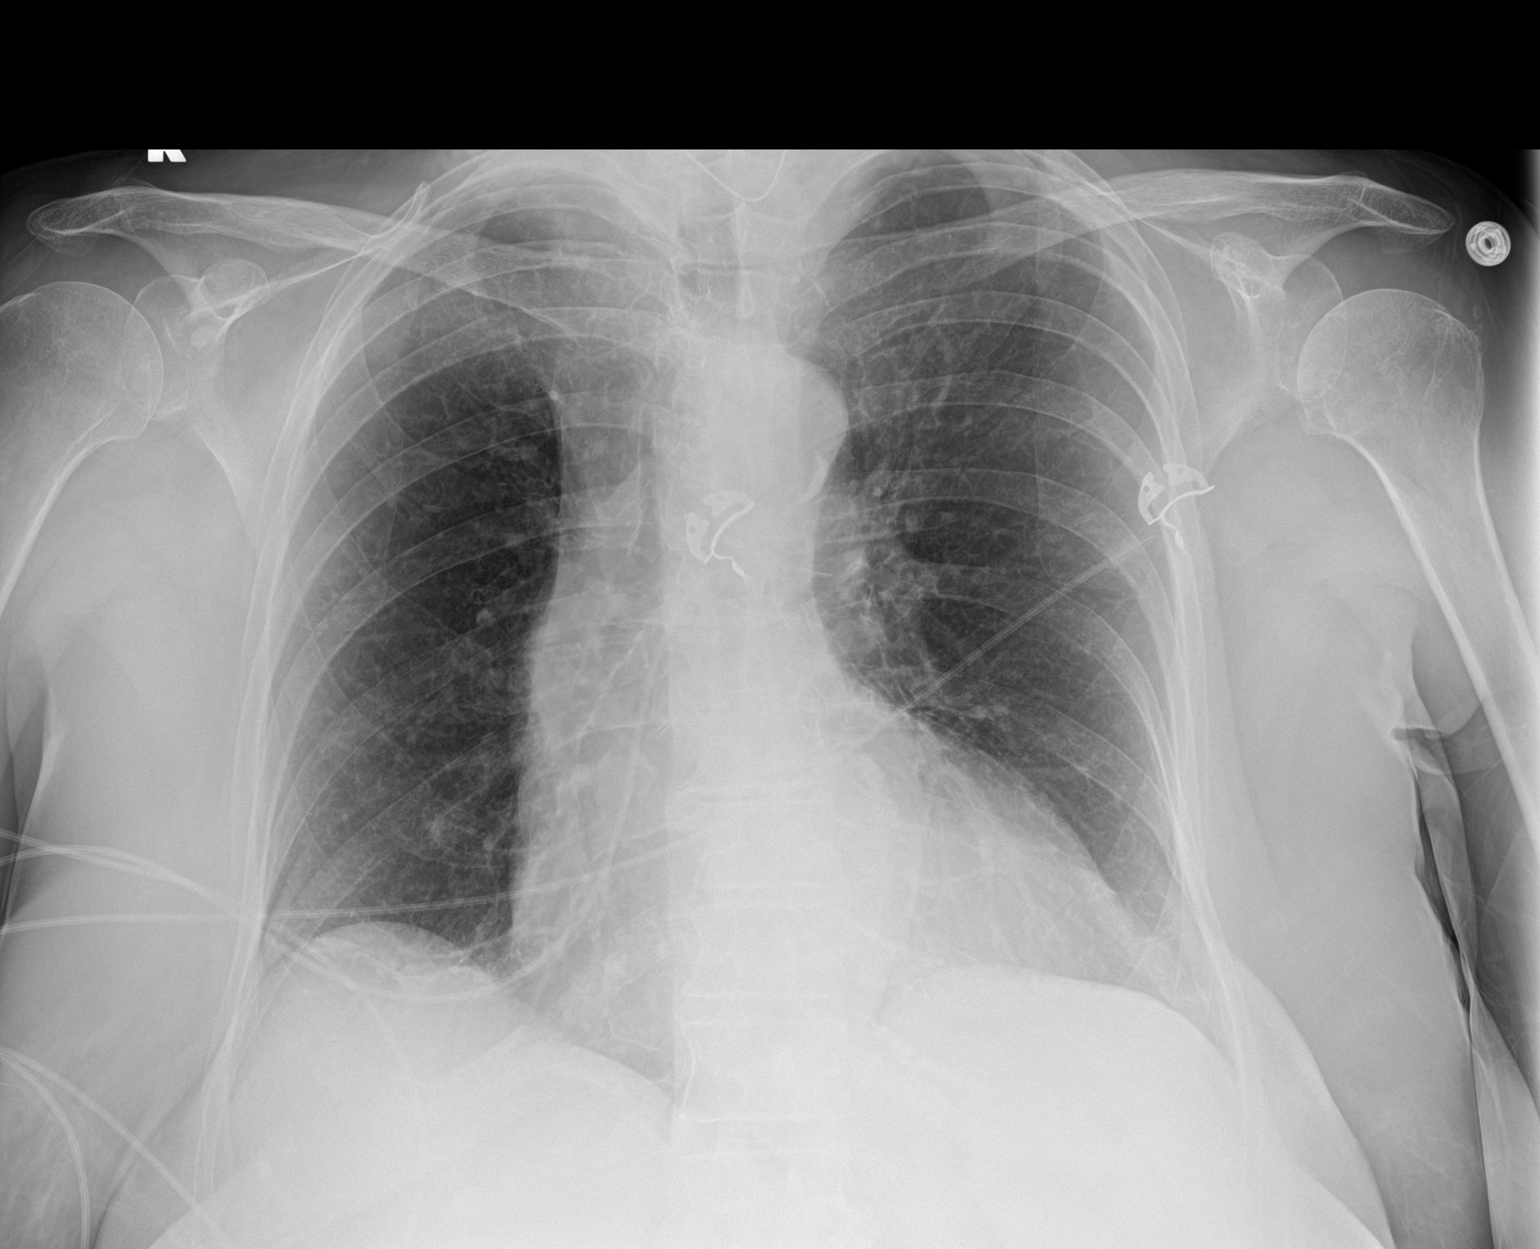

[1 of 1 positions shown; findings below may reference images not displayed]

FINDINGS: The cardiomediastinal contours are unchanged. Mild cardiomegaly with
aortic atherosclerosis. Pulmonary vasculature is normal. No
consolidation, pleural effusion, or pneumothorax. No acute osseous
abnormalities are seen.
IMPRESSION: Mild cardiomegaly with aortic atherosclerosis. No congestive failure
or acute pulmonary process.

Aortic Atherosclerosis ([H6]-[H6]).

## 2019-11-03 SURGERY — LEFT HEART CATH AND CORONARY ANGIOGRAPHY
Anesthesia: LOCAL

## 2019-11-03 MED ORDER — NOREPINEPHRINE 4 MG/250ML-% IV SOLN
0.0000 ug/min | INTRAVENOUS | Status: DC
  Filled 2019-11-03: qty 250

## 2019-11-03 MED ORDER — SODIUM CHLORIDE 0.9% FLUSH: 3.0000 mL | INTRAVENOUS | Status: DC | PRN

## 2019-11-03 MED ORDER — ASPIRIN 81 MG PO CHEW: 81.0000 mg | CHEWABLE_TABLET | ORAL | Status: DC

## 2019-11-03 MED ORDER — HEPARIN (PORCINE) IN NACL 1000-0.9 UT/500ML-% IV SOLN
INTRAVENOUS | Status: AC
  Filled 2019-11-03: qty 1000

## 2019-11-03 MED ORDER — OXYCODONE-ACETAMINOPHEN 7.5-325 MG PO TABS: 1.0000 | ORAL_TABLET | Freq: Three times a day (TID) | ORAL | Status: DC | PRN

## 2019-11-03 MED ORDER — CHLORHEXIDINE GLUCONATE 0.12 % MT SOLN
15.0000 mL | Freq: Once | OROMUCOSAL | Status: AC
  Administered 2019-11-04: 15 mL via OROMUCOSAL
  Filled 2019-11-03: qty 15

## 2019-11-03 MED ORDER — LISINOPRIL 20 MG PO TABS
20.0000 mg | ORAL_TABLET | Freq: Every day | ORAL | Status: DC
  Administered 2019-11-03: 20 mg via ORAL
  Filled 2019-11-03: qty 1

## 2019-11-03 MED ORDER — FENTANYL CITRATE (PF) 100 MCG/2ML IJ SOLN
INTRAMUSCULAR | Status: DC | PRN
  Administered 2019-11-03 (×4): 25 ug via INTRAVENOUS

## 2019-11-03 MED ORDER — MIDAZOLAM HCL 2 MG/2ML IJ SOLN
INTRAMUSCULAR | Status: AC
  Filled 2019-11-03: qty 2

## 2019-11-03 MED ORDER — MAGNESIUM SULFATE 50 % IJ SOLN
40.0000 meq | INTRAMUSCULAR | Status: DC
  Filled 2019-11-03: qty 9.85

## 2019-11-03 MED ORDER — DEXMEDETOMIDINE HCL IN NACL 400 MCG/100ML IV SOLN
0.1000 ug/kg/h | INTRAVENOUS | Status: AC
  Administered 2019-11-04: .5 ug/kg/h via INTRAVENOUS
  Filled 2019-11-03: qty 100

## 2019-11-03 MED ORDER — ATORVASTATIN CALCIUM 40 MG PO TABS
40.0000 mg | ORAL_TABLET | Freq: Every day | ORAL | Status: DC
  Administered 2019-11-03 – 2019-11-09 (×6): 40 mg via ORAL
  Filled 2019-11-03 (×6): qty 1

## 2019-11-03 MED ORDER — METOPROLOL TARTRATE 25 MG PO TABS
25.0000 mg | ORAL_TABLET | Freq: Two times a day (BID) | ORAL | Status: DC
  Administered 2019-11-03 (×2): 25 mg via ORAL
  Filled 2019-11-03 (×2): qty 1

## 2019-11-03 MED ORDER — PRESERVISION AREDS 2 PO CAPS: 1.0000 | ORAL_CAPSULE | Freq: Two times a day (BID) | ORAL | Status: DC

## 2019-11-03 MED ORDER — LABETALOL HCL 5 MG/ML IV SOLN
10.0000 mg | INTRAVENOUS | Status: AC | PRN
  Administered 2019-11-03 (×2): 10 mg via INTRAVENOUS
  Filled 2019-11-03: qty 4

## 2019-11-03 MED ORDER — HEPARIN SODIUM (PORCINE) 1000 UNIT/ML IJ SOLN
INTRAMUSCULAR | Status: DC | PRN
  Administered 2019-11-03: 3000 [IU] via INTRAVENOUS
  Administered 2019-11-03 (×2): 4000 [IU] via INTRAVENOUS

## 2019-11-03 MED ORDER — NITROGLYCERIN 1 MG/10 ML FOR IR/CATH LAB
INTRA_ARTERIAL | Status: AC
  Filled 2019-11-03: qty 10

## 2019-11-03 MED ORDER — HEPARIN SODIUM (PORCINE) 1000 UNIT/ML IJ SOLN
INTRAMUSCULAR | Status: AC
  Filled 2019-11-03: qty 1

## 2019-11-03 MED ORDER — SODIUM CHLORIDE 0.9 % IV SOLN: INTRAVENOUS | Status: AC

## 2019-11-03 MED ORDER — LIDOCAINE HCL (PF) 1 % IJ SOLN
INTRAMUSCULAR | Status: AC
  Filled 2019-11-03: qty 30

## 2019-11-03 MED ORDER — EPINEPHRINE HCL 5 MG/250ML IV SOLN IN NS
0.0000 ug/min | INTRAVENOUS | Status: DC
  Filled 2019-11-03: qty 250

## 2019-11-03 MED ORDER — IOHEXOL 350 MG/ML SOLN
INTRAVENOUS | Status: DC | PRN
  Administered 2019-11-03: 130 mL

## 2019-11-03 MED ORDER — ASPIRIN EC 81 MG PO TBEC: 81.0000 mg | DELAYED_RELEASE_TABLET | Freq: Every day | ORAL | Status: DC

## 2019-11-03 MED ORDER — VANCOMYCIN HCL 1250 MG/250ML IV SOLN
1250.0000 mg | INTRAVENOUS | Status: AC
  Administered 2019-11-04: 1250 mg via INTRAVENOUS
  Filled 2019-11-03: qty 250

## 2019-11-03 MED ORDER — METOPROLOL TARTRATE 12.5 MG HALF TABLET
12.5000 mg | ORAL_TABLET | Freq: Once | ORAL | Status: AC
  Administered 2019-11-04: 12.5 mg via ORAL
  Filled 2019-11-03: qty 1

## 2019-11-03 MED ORDER — ACETAMINOPHEN 325 MG PO TABS: 650.0000 mg | ORAL_TABLET | ORAL | Status: DC | PRN

## 2019-11-03 MED ORDER — CHLORHEXIDINE GLUCONATE CLOTH 2 % EX PADS
6.0000 | MEDICATED_PAD | Freq: Once | CUTANEOUS | Status: AC
  Administered 2019-11-03: 22:00:00 6 via TOPICAL

## 2019-11-03 MED ORDER — SODIUM CHLORIDE 0.9 % WEIGHT BASED INFUSION: 1.0000 mL/kg/h | INTRAVENOUS | Status: DC

## 2019-11-03 MED ORDER — PHENYLEPHRINE HCL-NACL 20-0.9 MG/250ML-% IV SOLN
30.0000 ug/min | INTRAVENOUS | Status: AC
  Administered 2019-11-04: 10 ug/min via INTRAVENOUS
  Filled 2019-11-03: qty 250

## 2019-11-03 MED ORDER — PANTOPRAZOLE SODIUM 40 MG PO TBEC
40.0000 mg | DELAYED_RELEASE_TABLET | Freq: Every day | ORAL | Status: DC
  Administered 2019-11-03: 40 mg via ORAL
  Filled 2019-11-03: qty 1

## 2019-11-03 MED ORDER — SODIUM CHLORIDE 0.9 % IV SOLN
INTRAVENOUS | Status: DC
  Filled 2019-11-03: qty 30

## 2019-11-03 MED ORDER — LIDOCAINE HCL (PF) 1 % IJ SOLN
INTRAMUSCULAR | Status: DC | PRN
  Administered 2019-11-03: 2 mL

## 2019-11-03 MED ORDER — POTASSIUM CHLORIDE 2 MEQ/ML IV SOLN
80.0000 meq | INTRAVENOUS | Status: DC
  Filled 2019-11-03: qty 40

## 2019-11-03 MED ORDER — TRANEXAMIC ACID (OHS) PUMP PRIME SOLUTION
2.0000 mg/kg | INTRAVENOUS | Status: DC
  Filled 2019-11-03: qty 1.56

## 2019-11-03 MED ORDER — NITROGLYCERIN 1 MG/10 ML FOR IR/CATH LAB
INTRA_ARTERIAL | Status: DC | PRN
  Administered 2019-11-03: 200 ug via INTRACORONARY

## 2019-11-03 MED ORDER — CALCIUM CARBONATE-VITAMIN D 500-200 MG-UNIT PO TABS
1.0000 | ORAL_TABLET | Freq: Every day | ORAL | Status: DC
  Filled 2019-11-03: qty 1

## 2019-11-03 MED ORDER — SODIUM CHLORIDE 0.9 % IV SOLN: 250.0000 mL | INTRAVENOUS | Status: DC | PRN

## 2019-11-03 MED ORDER — INSULIN REGULAR(HUMAN) IN NACL 100-0.9 UT/100ML-% IV SOLN
INTRAVENOUS | Status: AC
  Administered 2019-11-04: .8 [IU]/h via INTRAVENOUS
  Filled 2019-11-03: qty 100

## 2019-11-03 MED ORDER — HYDRALAZINE HCL 20 MG/ML IJ SOLN: 10.0000 mg | INTRAMUSCULAR | Status: AC | PRN

## 2019-11-03 MED ORDER — HYDROXYUREA 500 MG PO CAPS
500.0000 mg | ORAL_CAPSULE | Freq: Every day | ORAL | Status: DC
  Administered 2019-11-03: 500 mg via ORAL
  Filled 2019-11-03 (×2): qty 1

## 2019-11-03 MED ORDER — MILRINONE LACTATE IN DEXTROSE 20-5 MG/100ML-% IV SOLN
0.3000 ug/kg/min | INTRAVENOUS | Status: DC
  Filled 2019-11-03: qty 100

## 2019-11-03 MED ORDER — BISACODYL 5 MG PO TBEC: 5.0000 mg | DELAYED_RELEASE_TABLET | Freq: Once | ORAL | Status: DC

## 2019-11-03 MED ORDER — NITROGLYCERIN IN D5W 200-5 MCG/ML-% IV SOLN
2.0000 ug/min | INTRAVENOUS | Status: DC
  Filled 2019-11-03: qty 250

## 2019-11-03 MED ORDER — SODIUM CHLORIDE 0.9% FLUSH
3.0000 mL | Freq: Two times a day (BID) | INTRAVENOUS | Status: DC
  Administered 2019-11-03: 3 mL via INTRAVENOUS

## 2019-11-03 MED ORDER — MIDAZOLAM HCL 2 MG/2ML IJ SOLN
INTRAMUSCULAR | Status: DC | PRN
  Administered 2019-11-03 (×2): 1 mg via INTRAVENOUS

## 2019-11-03 MED ORDER — SODIUM CHLORIDE 0.9% FLUSH: 3.0000 mL | Freq: Two times a day (BID) | INTRAVENOUS | Status: DC

## 2019-11-03 MED ORDER — VERAPAMIL HCL 2.5 MG/ML IV SOLN
INTRAVENOUS | Status: AC
  Filled 2019-11-03: qty 2

## 2019-11-03 MED ORDER — LEVOFLOXACIN IN D5W 500 MG/100ML IV SOLN
500.0000 mg | INTRAVENOUS | Status: AC
  Administered 2019-11-04: 500 mg via INTRAVENOUS
  Filled 2019-11-03: qty 100

## 2019-11-03 MED ORDER — TEMAZEPAM 15 MG PO CAPS: 15.0000 mg | ORAL_CAPSULE | Freq: Once | ORAL | Status: DC | PRN

## 2019-11-03 MED ORDER — CHLORHEXIDINE GLUCONATE CLOTH 2 % EX PADS: 6.0000 | MEDICATED_PAD | Freq: Once | CUTANEOUS | Status: DC

## 2019-11-03 MED ORDER — MAGNESIUM OXIDE 400 (241.3 MG) MG PO TABS
200.0000 mg | ORAL_TABLET | Freq: Every day | ORAL | Status: DC
  Administered 2019-11-03: 200 mg via ORAL
  Filled 2019-11-03: qty 1

## 2019-11-03 MED ORDER — HEPARIN (PORCINE) 25000 UT/250ML-% IV SOLN
1000.0000 [IU]/h | INTRAVENOUS | Status: DC
  Administered 2019-11-03: 1000 [IU]/h via INTRAVENOUS
  Filled 2019-11-03: qty 250

## 2019-11-03 MED ORDER — LABETALOL HCL 5 MG/ML IV SOLN
INTRAVENOUS | Status: AC
  Filled 2019-11-03: qty 4

## 2019-11-03 MED ORDER — TRANEXAMIC ACID (OHS) BOLUS VIA INFUSION
15.0000 mg/kg | INTRAVENOUS | Status: AC
  Administered 2019-11-04: 1170 mg via INTRAVENOUS
  Filled 2019-11-03: qty 1170

## 2019-11-03 MED ORDER — HEPARIN (PORCINE) IN NACL 1000-0.9 UT/500ML-% IV SOLN
INTRAVENOUS | Status: DC | PRN
  Administered 2019-11-03 (×2): 500 mL

## 2019-11-03 MED ORDER — FLUTICASONE PROPIONATE 50 MCG/ACT NA SUSP
2.0000 | Freq: Every day | NASAL | Status: DC
  Administered 2019-11-03: 2 via NASAL
  Filled 2019-11-03: qty 16

## 2019-11-03 MED ORDER — TRANEXAMIC ACID 1000 MG/10ML IV SOLN
1.5000 mg/kg/h | INTRAVENOUS | Status: AC
  Administered 2019-11-04: 1.5 mg/kg/h via INTRAVENOUS
  Filled 2019-11-03: qty 25

## 2019-11-03 MED ORDER — ONDANSETRON HCL 4 MG/2ML IJ SOLN: 4.0000 mg | Freq: Four times a day (QID) | INTRAMUSCULAR | Status: DC | PRN

## 2019-11-03 MED ORDER — PLASMA-LYTE 148 IV SOLN
INTRAVENOUS | Status: AC
  Administered 2019-11-04: 500 mL
  Filled 2019-11-03: qty 2.5

## 2019-11-03 MED ORDER — SODIUM CHLORIDE 0.9 % WEIGHT BASED INFUSION
3.0000 mL/kg/h | INTRAVENOUS | Status: DC
  Administered 2019-11-03: 3 mL/kg/h via INTRAVENOUS

## 2019-11-03 MED ORDER — NITROGLYCERIN 0.4 MG SL SUBL: 0.4000 mg | SUBLINGUAL_TABLET | SUBLINGUAL | Status: DC | PRN

## 2019-11-03 MED ORDER — DIAZEPAM 5 MG PO TABS
5.0000 mg | ORAL_TABLET | Freq: Once | ORAL | Status: AC
  Administered 2019-11-04: 5 mg via ORAL
  Filled 2019-11-03: qty 1

## 2019-11-03 MED ORDER — FENTANYL CITRATE (PF) 100 MCG/2ML IJ SOLN
INTRAMUSCULAR | Status: AC
  Filled 2019-11-03: qty 2

## 2019-11-03 SURGICAL SUPPLY — 16 items
CATH 5FR JL3.5 JR4 ANG PIG MP (CATHETERS) ×1 IMPLANT
CATH OPTICROSS 40MHZ (CATHETERS) ×1 IMPLANT
CATH VISTA GUIDE 6FR XBLAD3.5 (CATHETERS) ×1 IMPLANT
DEVICE RAD COMP TR BAND LRG (VASCULAR PRODUCTS) ×1 IMPLANT
GLIDESHEATH SLEND SS 6F .021 (SHEATH) ×1 IMPLANT
GUIDEWIRE INQWIRE 1.5J.035X260 (WIRE) IMPLANT
INQWIRE 1.5J .035X260CM (WIRE) ×2
KIT ESSENTIALS PG (KITS) ×1 IMPLANT
KIT HEART LEFT (KITS) ×2 IMPLANT
PACK CARDIAC CATHETERIZATION (CUSTOM PROCEDURE TRAY) ×2 IMPLANT
SLED PULL BACK IVUS (MISCELLANEOUS) ×1 IMPLANT
SYR MEDRAD MARK 7 150ML (SYRINGE) ×2 IMPLANT
TRANSDUCER W/STOPCOCK (MISCELLANEOUS) ×2 IMPLANT
TUBING CIL FLEX 10 FLL-RA (TUBING) ×2 IMPLANT
WIRE ASAHI PROWATER 180CM (WIRE) ×1 IMPLANT
WIRE HI TORQ VERSACORE-J 145CM (WIRE) ×1 IMPLANT

## 2019-11-03 NOTE — Progress Notes (Signed)
TCTS consulted for CABG evaluation. °

## 2019-11-03 NOTE — Consult Note (Addendum)
[address]       [place] [address]             [phone]        Florena M Yaun Chaves Medical Record [id number] Date of Birth: 04-29-1941  Referring: No ref. provider found Primary Care: [name], MD Primary Cardiologist:No primary care provider on file.  Chief Complaint:  Chest pain  History of Present Illness:      Yolanda Flowers is a 79 year old female patient with past medical history significant for essential hypertension, COPD, GERD, hx of subdural hematoma in 2008, squamous cell carcinoma, melanoma in situ, and a vulvar intraepithelial neoplasia III in 2017, who had been seen by Dr. Geraldo Pitter and underwent CT coronary angiography which showed Multivessel coronary artery disease.  The patient was complaining of chest tightness and shortness of breath. She was having trouble just even walking to her mailbox.  She was scheduled for cardiac catheterization.  Today, she underwent cardiac catheterization which showed distal left main to ostial LAD lesion which is 60% stenosed with 80% stenosis of the side branch in the ostial circumflex.  There is a mid LAD lesion of 50% stenosis, first and second diagonal lesions are both 100% stenosed, there is a 30% stenosis in the distal circumflex, and there is some mild stenosis in the RCA.  Due to the patient's multivessel coronary artery disease, a cardiothoracic consult was recommended.  The patient had a bad experience with anesthesia. She was put to sleep for her first brain surgery (subdural hematoma) in June of 2008 at [facility] they had trouble waking her and "almost lost her". She is terrified of being put to sleep and has been putting off back surgery for years due to this intolerance. Her activity level is restricted due to shortness of breath and chronic back pain.    Current Activity/ Functional Status: Patient was independent with mobility/ambulation, transfers, ADL's, IADL's.   Zubrod Score: At the time of  surgery this patient's most appropriate activity status/level should be described as: []     0    Normal activity, no symptoms [x]     1    Restricted in physical strenuous activity but ambulatory, able to do out light work []     2    Ambulatory and capable of self care, unable to do work activities, up and about                 more than 50%  Of the time                            []     3    Only limited self care, in bed greater than 50% of waking hours []     4    Completely disabled, no self care, confined to bed or chair []     5    Moribund  Past Medical History:  Diagnosis Date  . Anxiety   . Arthritis   . Asthma   . Bilateral edema of lower extremity   . Complication of anesthesia    difficulty waking  . Congestive heart failure (CHF) ([place])   . COPD (chronic obstructive pulmonary disease) ([place])   . Dyspnea on exertion   . GERD (gastroesophageal reflux disease)   . History of melanoma excision    left greast toe 2015  . History of squamous cell carcinoma excision    face--  multiple  excisions  . History of subdural hematoma    2008  . Hypertension   . OSA (obstructive sleep apnea)    intolerant  . Thrombocytosis ([place])    takes hydoxyurea--  MONITORED BY DR Hinton Rao [facility])  . VIN III (vulvar intraepithelial neoplasia III)   . Wears dentures    UPPER AND LOWER PARTIAL  . Wears glasses     Past Surgical History:  Procedure Laterality Date  . ABDOMINAL HYSTERECTOMY  age 89  . KNEE ARTHROSCOPY Left 2004  . MELANOMA EXCISION  2015   left great toe  . REPAIR PERONEAL TENDONS ANKLE  2004  . SUBDURAL HEMATOMA EVACUATION VIA CRANIOTOMY  2008      week later  post-op  [facility]  . VULVECTOMY N/A 10/24/2015   Procedure: WIDE LOCAL EXCISION OF THE VULVA ;  Surgeon: Everitt Amber, MD;  Location: [name];  Service: Gynecology;  Laterality: N/A;    Social History   Tobacco Use  Smoking Status Never Smoker  Smokeless Tobacco Never Used      Social History   Substance and Sexual Activity  Alcohol Use No     Allergies  Allergen Reactions  . Cephalexin Hives  . Lactose Intolerance (Gi) Diarrhea  . Penicillins Rash    Childhood reaction Did it involve swelling of the face/tongue/throat, SOB, or low BP? No Did it involve sudden or severe rash/hives, skin peeling, or any reaction on the inside of your mouth or nose? No Did you need to seek medical attention at a hospital or doctor's office? No When did it last happen?50 years ago If all above answers are "NO", may proceed with cephalosporin use.     Current Facility-Administered Medications  Medication Dose Route Frequency Provider Last Rate Last Admin  . 0.9 %  sodium chloride infusion   Intravenous Continuous End, Christopher, MD 75 mL/hr at 11/03/19 1014 Rate Change at 11/03/19 1014  . 0.9 %  sodium chloride infusion  250 mL Intravenous PRN End, Harrell Gave, MD      . acetaminophen (TYLENOL) tablet 650 mg  650 mg Oral Q4H PRN End, Harrell Gave, MD      . Derrill Memo ON 11/04/2019] aspirin EC tablet 81 mg  81 mg Oral QHS End, Christopher, MD      . atorvastatin (LIPITOR) tablet 40 mg  40 mg Oral q1800 End, Christopher, MD      . Calcium Carbonate-Vitamin D 600-400 MG-UNIT 1 tablet  1 tablet Oral Daily End, Christopher, MD      . fluticasone (FLONASE) 50 MCG/ACT nasal spray 2 spray  2 spray Each Nare QHS End, Christopher, MD      . hydrALAZINE (APRESOLINE) injection 10 mg  10 mg Intravenous Q20 Min PRN End, Harrell Gave, MD      . hydroxyurea (HYDREA) capsule 500 mg  500 mg Oral Daily End, Christopher, MD      . labetalol (NORMODYNE) injection 10 mg  10 mg Intravenous Q10 min PRN End, Harrell Gave, MD   10 mg at 11/03/19 1052  . lisinopril (ZESTRIL) tablet 20 mg  20 mg Oral QHS End, Christopher, MD      . Magnesium TABS 250 mg  250 mg Oral QHS End, Christopher, MD      . metoprolol tartrate (LOPRESSOR) tablet 25 mg  25 mg Oral BID End, Christopher, MD      . nitroGLYCERIN  (NITROSTAT) SL tablet 0.4 mg  0.4 mg Sublingual Q5 min PRN End, Harrell Gave, MD      .  ondansetron (ZOFRAN) injection 4 mg  4 mg Intravenous Q6H PRN End, Harrell Gave, MD      . oxyCODONE-acetaminophen (PERCOCET) 7.5-325 MG per tablet 1 tablet  1 tablet Oral TID PRN End, Harrell Gave, MD      . pantoprazole (PROTONIX) EC tablet 40 mg  40 mg Oral Daily End, Christopher, MD      . PreserVision AREDS 2 CAPS 1 capsule  1 capsule Oral BID End, Harrell Gave, MD      . sodium chloride flush (NS) 0.9 % injection 3 mL  3 mL Intravenous Q12H End, Christopher, MD      . sodium chloride flush (NS) 0.9 % injection 3 mL  3 mL Intravenous PRN End, Christopher, MD        Medications Prior to Admission  Medication Sig Dispense Refill Last Dose  . Ascorbic Acid (VITAMIN C) 1000 MG tablet Take 1,000 mg by mouth daily.   11/02/2019 at Unknown time  . aspirin EC 81 MG tablet Take 81 mg by mouth at bedtime.    11/03/2019 at 0400  . Calcium Carb-Cholecalciferol (CALCIUM 600 + D PO) Take 1 tablet by mouth in the morning and at bedtime.    11/02/2019 at Unknown time  . erythromycin ophthalmic ointment Place 1 application into both eyes daily as needed (pain/ irritation).    prn  . fluticasone (FLONASE) 50 MCG/ACT nasal spray Place 2 sprays into both nostrils at bedtime.    11/02/2019 at Unknown time  . hydroxyurea (HYDREA) 500 MG capsule Take 500 mg by mouth daily.    11/02/2019 at Unknown time  . lansoprazole (PREVACID) 30 MG capsule Take 30 mg by mouth every morning.    11/02/2019 at Unknown time  . lisinopril (PRINIVIL,ZESTRIL) 20 MG tablet Take 20 mg by mouth at bedtime.    11/02/2019 at Unknown time  . Magnesium 250 MG TABS Take 250 mg by mouth at bedtime.    11/02/2019 at Unknown time  . Multiple Vitamins-Minerals (PRESERVISION AREDS 2) CAPS Take 1 capsule by mouth in the morning and at bedtime.   11/02/2019 at Unknown time  . mupirocin ointment (BACTROBAN) 2 % Apply 1 application topically daily as needed (after cancer  removal procedures).    prn  . nitroGLYCERIN (NITROSTAT) 0.4 MG SL tablet Place 1 tablet (0.4 mg total) under the tongue every 5 (five) minutes as needed. 25 tablet 3 Past Week at Unknown time  . oxyCODONE-acetaminophen (PERCOCET) 7.5-325 MG tablet Take 1 tablet by mouth 3 (three) times daily as needed for severe pain.   Past Week at Unknown time  . Vitamin D, Ergocalciferol, (DRISDOL) 1.25 MG (50000 UT) CAPS capsule Take 50,000 Units by mouth every 14 (fourteen) days.   Past Month at Unknown time  . ammonium lactate (LAC-HYDRIN) 12 % lotion Apply 1 application topically daily as needed (skin spots).    prn  . metoprolol tartrate (LOPRESSOR) 50 MG tablet Take 2 tablets (100 mg total) by mouth once for 1 dose. Take 2 tablets two hours prior to procedure if HR is 55 or greater (Patient not taking: Reported on 10/28/2019) 2 tablet 0 Completed Course at Unknown time    Family History  Problem Relation Age of Onset  . Clotting disorder Mother   . Hypertension Mother   . Heart disease Mother   . Arthritis Mother   . Stroke Father   . Hypertension Father   . Heart disease Father   . Arthritis Father   . Emphysema Father   . Asthma  Father   . Kidney failure Father   . Hypertension Sister   . Heart disease Sister   . Arthritis Sister   . Hypertension Brother   . Heart disease Brother   . Arthritis Brother      Review of Systems:   Review of Systems  Constitutional: Negative for chills and fever.  Respiratory: Positive for shortness of breath. Negative for cough.   Cardiovascular: Positive for chest pain. Negative for leg swelling.  Gastrointestinal: Negative for abdominal pain, nausea and vomiting.  Musculoskeletal: Positive for back pain and joint pain.   Pertinent items are noted in HPI.    Physical Exam: BP (!) 141/81   Pulse 66   Temp 98 F (36.7 C) (Oral)   Resp 18   Ht 5\' 5"  (1.651 m)   Wt 78 kg   SpO2 98%   BMI 28.62 kg/m    General appearance: alert, cooperative  and no distress Resp: clear to auscultation bilaterally Cardio: regular rate and rhythm, S1, S2 normal, no murmur, click, rub or gallop GI: soft, non-tender; bowel sounds normal; no masses,  no organomegaly Extremities: extremities normal, atraumatic, no cyanosis or edema Neurologic: Grossly normal  Diagnostic Studies & Laboratory data:     Recent Radiology Findings:   CARDIAC CATHETERIZATION  Result Date: 11/03/2019 Conclusions: 1. Multivessel coronary artery disease with heavy calcification of the major coronary arteries.  There is a 60% eccentric stenosis involving the distal LMCA that extends into the ostial LCx where there is at least 80% narrowing.  There is 50% stenosis of the mid LAD as well as chronic total occlusions of D1 and D2. 2. Hyperdynamic left ventricular systolic function with normal filling pressure. Recommendations: 1. Inpatient cardiac surgery consultation for management of accelerating angina, including chest pain during engagement of the left coronary artery.  While the LMCA stenosis is not critical by angiography or IVUS (MLA at least 8-10 mm^2), the lesion leads to severe stenosis of the ostial LCx.  The LCx stenosis cannot be treated percutaneously without involving the LMCA/LAD. 2. Start IV heparin 2 hours after deflation of TR band. 3. Obtain transthoracic echocardiogram. 4. Aggressive secondary prevention, including high-intensity statin therapy. 5. Start low-dose beta blocker for antianginal therapy and blood pressure control. Nelva Bush, MD [facility]     I have independently reviewed the above radiologic studies and discussed with the patient   Recent Lab Findings: Lab Results  Component Value Date   WBC 15.7 (H) 02/09/2019   HGB 11.6 (L) 02/09/2019   HCT 35.6 (L) 02/09/2019   PLT 204.0 02/09/2019   GLUCOSE 89 10/04/2019   NA 141 10/04/2019   K 4.5 10/04/2019   CL 103 10/04/2019   CREATININE 1.14 (H) 10/04/2019   BUN 16 10/04/2019   CO2 23  10/04/2019   INR 1.1 06/23/2018      Assessment / Plan:      1.  Multivessel coronary artery disease-cardiac catheterization report above.  Continue aspirin, statin, and metoprolol. heparin gtt. 2 hours post TR band removal. 2.  Hypertension-continue as needed IV hydralazine and as needed IV labetalol for control.  She is also on lisinopril 20 mg at bedtime.  If she is a surgical candidate this will likely need to be held.  3.  COPD-currently not needing any corticosteroids. Never a smoker and potentially misdiagnosed  4.  GERD-continue Protonix 40 mg daily. 5. Chronic back pain- was on oxycodone at home without relief.  6. OSA- no CPAP at home.  Plan:  Echocardiogram today for pre-op workup. Explained CABG to the patient in detail and answered questions to the patient's satisfaction. She is very concerned about her adverse reaction to anesthesia. It happened at [facility] in June 2008 and we will need to records from the incident.  Dr. Cyndia Bent to review films and give his recommendations.     I  spent 55 minutes counseling the patient face to face.   Nicholes Rough, PA-C 11/03/2019 12:19 PM   Chart reviewed, patient examined, agree with above. This 79 year old woman has a couple year history of exertional chest pain and shortness of breath that she says was worked up before at [facility] in 06/2018 with a [place] cardiolyte which showed no inducible ischemia. She has now had progressively worsening symptoms occurring with minimal exertion. Cath today shows severe multivessel CAD with an eccentric calcified distal LM stenosis that extends into the ostium of the LCX and compromises the LAD. There is a medium caliber diagonal that is occluded, large single OM. RCA has no significant stenosis. I agree that CABG is indicated. I discussed the operative procedure with the patient and family including alternatives, benefits and risks; including but not limited to bleeding, blood transfusion,  infection, stroke, myocardial infarction, graft failure, heart block requiring a permanent pacemaker, organ dysfunction, and death.  Yolanda Flowers understands and agrees to proceed.  We will schedule surgery for tomorrow morning.

## 2019-11-03 NOTE — Interval H&P Note (Signed)
History and Physical Interval Note:  11/03/2019 7:34 AM  Yolanda Flowers  has presented today for surgery, with the diagnosis of accelerating angina.  The various methods of treatment have been discussed with the patient and family. After consideration of risks, benefits and other options for treatment, the patient has consented to  Procedure(s): LEFT HEART CATH AND CORONARY ANGIOGRAPHY (N/A) as a surgical intervention.  The patient's history has been reviewed, patient examined, no change in status, stable for surgery.  I have reviewed the patient's chart and labs.  Questions were answered to the patient's satisfaction.    Cath Lab Visit (complete for each Cath Lab visit)  Clinical Evaluation Leading to the Procedure:   ACS: No.  Non-ACS:    Anginal Classification: CCS III  Anti-ischemic medical therapy: Minimal Therapy (1 class of medications)  Non-Invasive Test Results: Equivocal test results  Prior CABG: No previous CABG  Yolanda Flowers

## 2019-11-03 NOTE — Anesthesia Preprocedure Evaluation (Addendum)
Anesthesia Evaluation  Patient identified by MRN, date of birth, ID band Patient awake    Reviewed: Allergy & Precautions, NPO status , Patient's Chart, lab work & pertinent test results  History of Anesthesia Complications (+) PROLONGED EMERGENCE  Airway Mallampati: I  TM Distance: >3 FB Neck ROM: Full    Dental  (+) Dental Advisory Given   Pulmonary sleep apnea (does not tolerate CPAP) , COPD,  11/01/2019 SARS coronavirus NEG   breath sounds clear to auscultation       Cardiovascular hypertension, Pt. on medications and Pt. on home beta blockers + angina + CAD (60% LM, with multivessel ASCAD)   Rhythm:Regular Rate:Normal  11/03/2019 ECHO: EF 60-65%, valves OK   Neuro/Psych Anxiety H/o SDH    GI/Hepatic Neg liver ROS, GERD  Controlled,  Endo/Other  negative endocrine ROS  Renal/GU Renal InsufficiencyRenal disease     Musculoskeletal   Abdominal   Peds  Hematology H/o thrombocytosis   Anesthesia Other Findings   Reproductive/Obstetrics                           Anesthesia Physical Anesthesia Plan  ASA: III  Anesthesia Plan: General   Post-op Pain Management:    Induction: Intravenous  PONV Risk Score and Plan: 3 and Treatment may vary due to age or medical condition  Airway Management Planned: Oral ETT  Additional Equipment: Arterial line, PA Cath, TEE and Ultrasound Guidance Line Placement  Intra-op Plan:   Post-operative Plan: Post-operative intubation/ventilation  Informed Consent: I have reviewed the patients History and Physical, chart, labs and discussed the procedure including the risks, benefits and alternatives for the proposed anesthesia with the patient or authorized representative who has indicated his/her understanding and acceptance.     Dental advisory given  Plan Discussed with: CRNA and Surgeon  Anesthesia Plan Comments:        Anesthesia Quick  Evaluation

## 2019-11-03 NOTE — Progress Notes (Signed)
ANTICOAGULATION CONSULT NOTE - Initial Consult  Pharmacy Consult for heparin Indication: chest pain/ACS  Patient Measurements: Height: 5\' 5"  (165.1 cm) Weight: 172 lb (78 kg) IBW/kg (Calculated) : 57 Heparin Dosing Weight: 73kg  Vital Signs: Temp: 98.9 F (37.2 C) (03/17 2011) Temp Source: Oral (03/17 2011) BP: 124/70 (03/17 2011) Pulse Rate: 70 (03/17 2011)  Labs: No results for input(s): HGB, HCT, PLT, APTT, LABPROT, INR, HEPARINUNFRC, HEPRLOWMOCWT, CREATININE, CKTOTAL, CKMB, TROPONINIHS in the last 72 hours.    Assessment: 79 yo W with multivessel CAD scheduled for CABG 3/18. Patient required TR band for radial hemostasis after pre-CABG screening procedures today. Per RN, patient required re-inflation of band due to persistent bleeding and TR band was not removed until about 19:30. Will start heparin 2 hours after per consult with no bolus.   Goal of Therapy:  Heparin level 0.3-0.7 units/ml Monitor platelets by anticoagulation protocol: Yes   Plan:  Start heparin 1000 units/hr at 21:30 Monitor daily HL, CBC/plt Monitor for signs/symptoms of bleeding   Benetta Spar, PharmD, BCPS, BCCP Clinical Pharmacist  Please check AMION for all [place] phone numbers After 10:00 PM, call [place] [phone]

## 2019-11-03 NOTE — Progress Notes (Signed)
  Echocardiogram 2D Echocardiogram has been performed.  Yolanda Flowers 11/03/2019, 2:24 PM

## 2019-11-03 NOTE — Progress Notes (Signed)
Pre-CABG Dopplers completed. Refer to "CV Proc" under chart review to view preliminary results.  11/03/2019 5:00 PM Kelby Aline., MHA, RVT, RDCS, RDMS   Baldwin Crown, RVT, RDMS

## 2019-11-03 NOTE — Progress Notes (Signed)
[place] for heparin Indication: chest pain/ACS  Allergies  Allergen Reactions  . Cephalexin Hives  . Lactose Intolerance (Gi) Diarrhea  . Penicillins Rash    Childhood reaction Did it involve swelling of the face/tongue/throat, SOB, or low BP? No Did it involve sudden or severe rash/hives, skin peeling, or any reaction on the inside of your mouth or nose? No Did you need to seek medical attention at a hospital or doctor's office? No When did it last happen?50 years ago If all above answers are "NO", may proceed with cephalosporin use.     Patient Measurements: Height: 5\' 5"  (165.1 cm) Weight: 172 lb (78 kg) IBW/kg (Calculated) : 57 Heparin Dosing Weight: 78kg  Vital Signs: Temp: 98 F (36.7 C) (03/17 1200) Temp Source: Oral (03/17 1200) BP: 141/81 (03/17 1200) Pulse Rate: 66 (03/17 1200)  Labs: No results for input(s): HGB, HCT, PLT, APTT, LABPROT, INR, HEPARINUNFRC, HEPRLOWMOCWT, CREATININE, CKTOTAL, CKMB, TROPONINIHS in the last 72 hours.  CrCl cannot be calculated (Patient's most recent lab result is older than the maximum 21 days allowed.).   Medical History: Past Medical History:  Diagnosis Date  . Anxiety   . Arthritis   . Asthma   . Bilateral edema of lower extremity   . Complication of anesthesia    difficulty waking  . Congestive heart failure (CHF) ([place])   . COPD (chronic obstructive pulmonary disease) ([place])   . Dyspnea on exertion   . GERD (gastroesophageal reflux disease)   . History of melanoma excision    left greast toe 2015  . History of squamous cell carcinoma excision    face--  multiple excisions  . History of subdural hematoma    2008  . Hypertension   . OSA (obstructive sleep apnea)    intolerant  . Thrombocytosis ([place])    takes hydoxyurea--  MONITORED BY DR Hinton Rao [facility])  . VIN III (vulvar intraepithelial neoplasia III)   . Wears dentures    UPPER AND LOWER PARTIAL  .  Wears glasses     Assessment: 65 yoF with stable angina admitted for LHC found to have multivessel CAD. Pharmacy asked to start IV heparin 2h after TR band removed. No AC PTA.  Goal of Therapy:  Heparin level 0.3-0.7 units/ml Monitor platelets by anticoagulation protocol: Yes   Plan:  -Heparin 1000 units/h with no bolus starting 2h after TR band off -Check 8hr heparin level once started -F/U surgical plans   Arrie Senate, PharmD, BCPS Clinical Pharmacist [phone] Please check AMION for all [place] numbers 11/03/2019

## 2019-11-04 ENCOUNTER — Inpatient Hospital Stay (HOSPITAL_COMMUNITY)
Admission: RE | Disposition: A | Discharge status: Home / Self Care | Payer: Self-pay | Source: Home / Self Care | Attending: Surgery

## 2019-11-04 ENCOUNTER — Inpatient Hospital Stay (HOSPITAL_COMMUNITY): Payer: PPO | Admitting: Anesthesiology

## 2019-11-04 ENCOUNTER — Encounter (HOSPITAL_COMMUNITY): Payer: Self-pay | Admitting: Internal Medicine

## 2019-11-04 ENCOUNTER — Inpatient Hospital Stay (HOSPITAL_COMMUNITY): Payer: PPO

## 2019-11-04 DIAGNOSIS — Z951 Presence of aortocoronary bypass graft: Secondary | ICD-10-CM

## 2019-11-04 HISTORY — DX: Presence of aortocoronary bypass graft: Z95.1

## 2019-11-04 HISTORY — PX: CORONARY ARTERY BYPASS GRAFT: SHX141

## 2019-11-04 HISTORY — PX: TEE WITHOUT CARDIOVERSION: SHX5443

## 2019-11-04 LAB — POCT I-STAT 7, (LYTES, BLD GAS, ICA,H+H)
Acid-base deficit: 2 mmol/L (ref 0.0–2.0)
Acid-base deficit: 6 mmol/L — ABNORMAL HIGH (ref 0.0–2.0)
Acid-base deficit: 3 mmol/L — ABNORMAL HIGH (ref 0.0–2.0)
Acid-base deficit: 4 mmol/L — ABNORMAL HIGH (ref 0.0–2.0)
Acid-base deficit: 3 mmol/L — ABNORMAL HIGH (ref 0.0–2.0)
Bicarbonate: 24.3 mmol/L (ref 20.0–28.0)
Bicarbonate: 18.2 mmol/L — ABNORMAL LOW (ref 20.0–28.0)
Bicarbonate: 22.2 mmol/L (ref 20.0–28.0)
Bicarbonate: 20.6 mmol/L (ref 20.0–28.0)
Bicarbonate: 22.5 mmol/L (ref 20.0–28.0)
Calcium, Ion: 0.87 mmol/L — CL (ref 1.15–1.40)
Calcium, Ion: 1.15 mmol/L (ref 1.15–1.40)
Calcium, Ion: 1.1 mmol/L — ABNORMAL LOW (ref 1.15–1.40)
Calcium, Ion: 1.02 mmol/L — ABNORMAL LOW (ref 1.15–1.40)
Calcium, Ion: 1.11 mmol/L — ABNORMAL LOW (ref 1.15–1.40)
HCT: 25 % — ABNORMAL LOW (ref 36.0–46.0)
HCT: 18 % — ABNORMAL LOW (ref 36.0–46.0)
HCT: 18 % — ABNORMAL LOW (ref 36.0–46.0)
HCT: 18 % — ABNORMAL LOW (ref 36.0–46.0)
HCT: 19 % — ABNORMAL LOW (ref 36.0–46.0)
Hemoglobin: 8.5 g/dL — ABNORMAL LOW (ref 12.0–15.0)
Hemoglobin: 6.1 g/dL — CL (ref 12.0–15.0)
Hemoglobin: 6.1 g/dL — CL (ref 12.0–15.0)
Hemoglobin: 6.1 g/dL — CL (ref 12.0–15.0)
Hemoglobin: 6.5 g/dL — CL (ref 12.0–15.0)
O2 Saturation: 97 %
O2 Saturation: 99 %
O2 Saturation: 97 %
O2 Saturation: 97 %
O2 Saturation: 83 %
Patient temperature: 35.9
Patient temperature: 36.7
Patient temperature: 36.8
Patient temperature: 36.8
Patient temperature: 36.7
Potassium: 4.8 mmol/L (ref 3.5–5.1)
Potassium: 4.4 mmol/L (ref 3.5–5.1)
Potassium: 4.1 mmol/L (ref 3.5–5.1)
Potassium: 3.6 mmol/L (ref 3.5–5.1)
Potassium: 3.8 mmol/L (ref 3.5–5.1)
Sodium: 144 mmol/L (ref 135–145)
Sodium: 143 mmol/L (ref 135–145)
Sodium: 145 mmol/L (ref 135–145)
Sodium: 147 mmol/L — ABNORMAL HIGH (ref 135–145)
Sodium: 145 mmol/L (ref 135–145)
TCO2: 26 mmol/L (ref 22–32)
TCO2: 19 mmol/L — ABNORMAL LOW (ref 22–32)
TCO2: 23 mmol/L (ref 22–32)
TCO2: 22 mmol/L (ref 22–32)
TCO2: 24 mmol/L (ref 22–32)
pCO2 arterial: 46.3 mmHg (ref 32.0–48.0)
pCO2 arterial: 29.6 mmHg — ABNORMAL LOW (ref 32.0–48.0)
pCO2 arterial: 36 mmHg (ref 32.0–48.0)
pCO2 arterial: 34.5 mmHg (ref 32.0–48.0)
pCO2 arterial: 38.5 mmHg (ref 32.0–48.0)
pH, Arterial: 7.323 — ABNORMAL LOW (ref 7.350–7.450)
pH, Arterial: 7.394 (ref 7.350–7.450)
pH, Arterial: 7.396 (ref 7.350–7.450)
pH, Arterial: 7.383 (ref 7.350–7.450)
pH, Arterial: 7.373 (ref 7.350–7.450)
pO2, Arterial: 99 mmHg (ref 83.0–108.0)
pO2, Arterial: 121 mmHg — ABNORMAL HIGH (ref 83.0–108.0)
pO2, Arterial: 92 mmHg (ref 83.0–108.0)
pO2, Arterial: 87 mmHg (ref 83.0–108.0)
pO2, Arterial: 47 mmHg — ABNORMAL LOW (ref 83.0–108.0)

## 2019-11-04 LAB — CBC
HCT: 35 % — ABNORMAL LOW (ref 36.0–46.0)
HCT: 25 % — ABNORMAL LOW (ref 36.0–46.0)
HCT: 22.4 % — ABNORMAL LOW (ref 36.0–46.0)
Hemoglobin: 11.2 g/dL — ABNORMAL LOW (ref 12.0–15.0)
Hemoglobin: 8 g/dL — ABNORMAL LOW (ref 12.0–15.0)
Hemoglobin: 7.3 g/dL — ABNORMAL LOW (ref 12.0–15.0)
MCH: 35 pg — ABNORMAL HIGH (ref 26.0–34.0)
MCH: 34.9 pg — ABNORMAL HIGH (ref 26.0–34.0)
MCH: 34.1 pg — ABNORMAL HIGH (ref 26.0–34.0)
MCHC: 32 g/dL (ref 30.0–36.0)
MCHC: 32 g/dL (ref 30.0–36.0)
MCHC: 32.6 g/dL (ref 30.0–36.0)
MCV: 109.4 fL — ABNORMAL HIGH (ref 80.0–100.0)
MCV: 109.2 fL — ABNORMAL HIGH (ref 80.0–100.0)
MCV: 104.7 fL — ABNORMAL HIGH (ref 80.0–100.0)
Platelets: 142 10*3/uL — ABNORMAL LOW (ref 150–400)
Platelets: 121 10*3/uL — ABNORMAL LOW (ref 150–400)
Platelets: 93 10*3/uL — ABNORMAL LOW (ref 150–400)
RBC: 3.2 MIL/uL — ABNORMAL LOW (ref 3.87–5.11)
RBC: 2.29 MIL/uL — ABNORMAL LOW (ref 3.87–5.11)
RBC: 2.14 MIL/uL — ABNORMAL LOW (ref 3.87–5.11)
RDW: 16.4 % — ABNORMAL HIGH (ref 11.5–15.5)
RDW: 18.1 % — ABNORMAL HIGH (ref 11.5–15.5)
RDW: 17.2 % — ABNORMAL HIGH (ref 11.5–15.5)
WBC: 13.9 10*3/uL — ABNORMAL HIGH (ref 4.0–10.5)
WBC: 19.8 10*3/uL — ABNORMAL HIGH (ref 4.0–10.5)
WBC: 20.5 10*3/uL — ABNORMAL HIGH (ref 4.0–10.5)
nRBC: 0.4 % — ABNORMAL HIGH (ref 0.0–0.2)
nRBC: 0.5 % — ABNORMAL HIGH (ref 0.0–0.2)
nRBC: 0.1 % (ref 0.0–0.2)

## 2019-11-04 LAB — BLOOD GAS, ARTERIAL
Acid-Base Excess: 0 mmol/L (ref 0.0–2.0)
Bicarbonate: 23.8 mmol/L (ref 20.0–28.0)
Drawn by: 511471
FIO2: 21
O2 Saturation: 97.6 %
Patient temperature: 36.8
pCO2 arterial: 36.3 mmHg (ref 32.0–48.0)
pH, Arterial: 7.432 (ref 7.350–7.450)
pO2, Arterial: 85.8 mmHg (ref 83.0–108.0)

## 2019-11-04 LAB — BASIC METABOLIC PANEL
Anion gap: 11 (ref 5–15)
Anion gap: 9 (ref 5–15)
BUN: 12 mg/dL (ref 8–23)
BUN: 13 mg/dL (ref 8–23)
CO2: 24 mmol/L (ref 22–32)
CO2: 23 mmol/L (ref 22–32)
Calcium: 8.7 mg/dL — ABNORMAL LOW (ref 8.9–10.3)
Calcium: 7.6 mg/dL — ABNORMAL LOW (ref 8.9–10.3)
Chloride: 107 mmol/L (ref 98–111)
Chloride: 109 mmol/L (ref 98–111)
Creatinine, Ser: 1.07 mg/dL — ABNORMAL HIGH (ref 0.44–1.00)
Creatinine, Ser: 1.04 mg/dL — ABNORMAL HIGH (ref 0.44–1.00)
GFR calc Af Amer: 57 mL/min — ABNORMAL LOW (ref >60)
GFR calc Af Amer: 59 mL/min — ABNORMAL LOW (ref >60)
GFR calc non Af Amer: 49 mL/min — ABNORMAL LOW (ref >60)
GFR calc non Af Amer: 51 mL/min — ABNORMAL LOW (ref >60)
Glucose, Bld: 94 mg/dL (ref 70–99)
Glucose, Bld: 166 mg/dL — ABNORMAL HIGH (ref 70–99)
Potassium: 4.7 mmol/L (ref 3.5–5.1)
Potassium: 4.2 mmol/L (ref 3.5–5.1)
Sodium: 142 mmol/L (ref 135–145)
Sodium: 141 mmol/L (ref 135–145)

## 2019-11-04 LAB — HEMOGLOBIN AND HEMATOCRIT, BLOOD
HCT: 24.8 % — ABNORMAL LOW (ref 36.0–46.0)
Hemoglobin: 8.1 g/dL — ABNORMAL LOW (ref 12.0–15.0)

## 2019-11-04 LAB — PREPARE RBC (CROSSMATCH)

## 2019-11-04 LAB — GLUCOSE, CAPILLARY
Glucose-Capillary: 114 mg/dL — ABNORMAL HIGH (ref 70–99)
Glucose-Capillary: 121 mg/dL — ABNORMAL HIGH (ref 70–99)
Glucose-Capillary: 97 mg/dL (ref 70–99)
Glucose-Capillary: 127 mg/dL — ABNORMAL HIGH (ref 70–99)
Glucose-Capillary: 121 mg/dL — ABNORMAL HIGH (ref 70–99)
Glucose-Capillary: 145 mg/dL — ABNORMAL HIGH (ref 70–99)
Glucose-Capillary: 132 mg/dL — ABNORMAL HIGH (ref 70–99)
Glucose-Capillary: 142 mg/dL — ABNORMAL HIGH (ref 70–99)
Glucose-Capillary: 93 mg/dL (ref 70–99)

## 2019-11-04 LAB — PROTIME-INR
INR: 1.5 — ABNORMAL HIGH (ref 0.8–1.2)
Prothrombin Time: 17.7 seconds — ABNORMAL HIGH (ref 11.4–15.2)

## 2019-11-04 LAB — ECHO INTRAOPERATIVE TEE
Height: 65 in
Weight: 2721.6 oz

## 2019-11-04 LAB — PLATELET COUNT: Platelets: 134 10*3/uL — ABNORMAL LOW (ref 150–400)

## 2019-11-04 LAB — MAGNESIUM: Magnesium: 2.7 mg/dL — ABNORMAL HIGH (ref 1.7–2.4)

## 2019-11-04 LAB — APTT: aPTT: 36 seconds (ref 24–36)

## 2019-11-04 LAB — HEPARIN LEVEL (UNFRACTIONATED): Heparin Unfractionated: 0.29 IU/mL — ABNORMAL LOW (ref 0.30–0.70)

## 2019-11-04 IMAGING — DX DG CHEST 1V PORT
1 series · 1 of 1 positions shown · non-contrast
Comparison: [DATE]

CLINICAL DATA: Hypoxia

EXAM:
PORTABLE CHEST 1 VIEW

[chest]
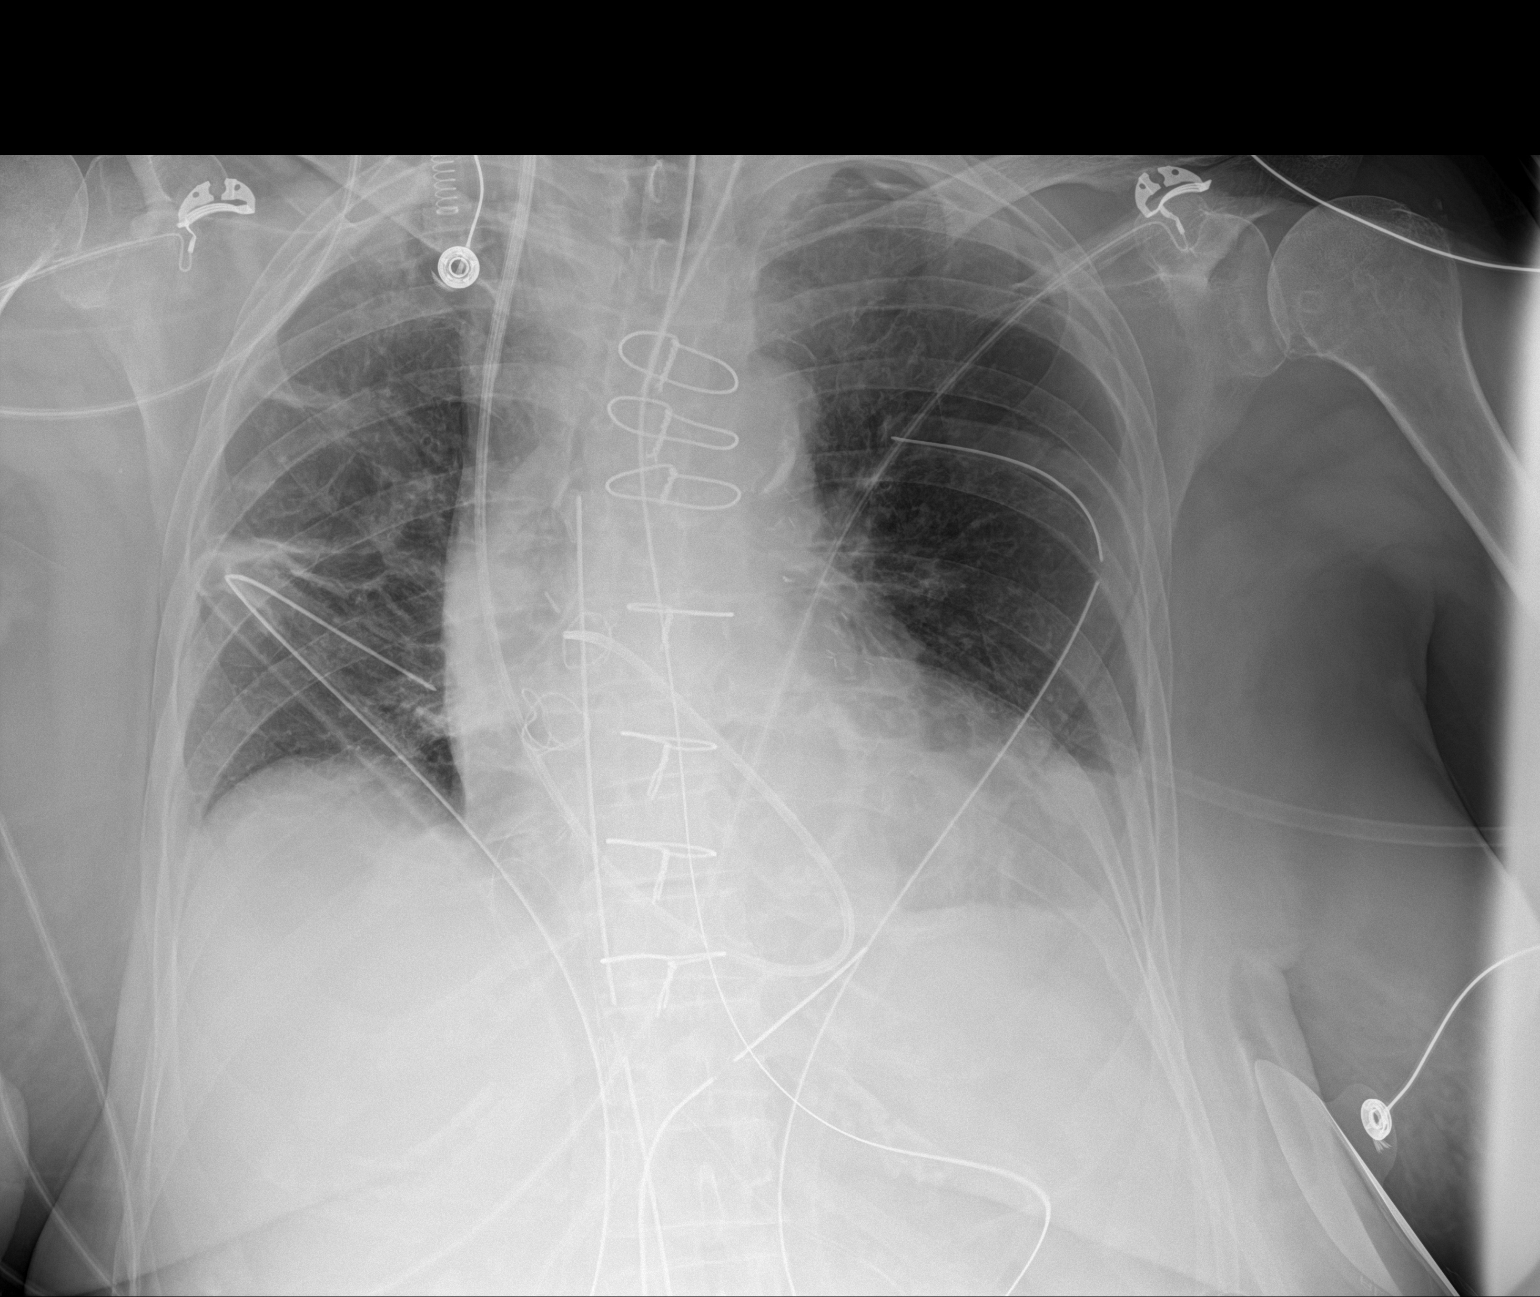

[1 of 1 positions shown; findings below may reference images not displayed]

FINDINGS: Patient is status post coronary artery bypass grafting. Endotracheal
tube tip is 2.9 cm above the carina. Nasogastric tube tip and side
port are below the diaphragm. Swan-Ganz catheter tip is in the
proximal right main pulmonary artery. There is a chest tube on each
side as well as a mediastinal drain. There is a minimal left apical
pneumothorax. There is atelectatic change in the right upper lobe
and right mid lung regions. There is also mild bibasilar
atelectasis. There is calcification in the mitral annulus. There is
aortic atherosclerosis. No adenopathy. No bone lesions.
IMPRESSION: Tube and catheter positions as described. Small left apical
pneumothorax without tension component. Areas of scattered
atelectatic change in the right upper lobe, right mid lung, and left
base. Postoperative changes. Stable cardiac size. Aortic
Atherosclerosis ([EU]-[EU]).

## 2019-11-04 SURGERY — CORONARY ARTERY BYPASS GRAFTING (CABG)
Anesthesia: General | Site: Chest

## 2019-11-04 MED ORDER — POTASSIUM CHLORIDE 10 MEQ/50ML IV SOLN: 10.0000 meq | INTRAVENOUS | Status: AC

## 2019-11-04 MED ORDER — MIDAZOLAM HCL (PF) 10 MG/2ML IJ SOLN
INTRAMUSCULAR | Status: AC
  Filled 2019-11-04: qty 2

## 2019-11-04 MED ORDER — SODIUM CHLORIDE 0.9 % IV SOLN: INTRAVENOUS | Status: DC | PRN

## 2019-11-04 MED ORDER — PROPOFOL 10 MG/ML IV BOLUS
INTRAVENOUS | Status: DC | PRN
  Administered 2019-11-04 (×2): 20 mg via INTRAVENOUS

## 2019-11-04 MED ORDER — METOPROLOL TARTRATE 5 MG/5ML IV SOLN: 2.5000 mg | INTRAVENOUS | Status: DC | PRN

## 2019-11-04 MED ORDER — ALBUMIN HUMAN 5 % IV SOLN
250.0000 mL | INTRAVENOUS | Status: AC | PRN
  Administered 2019-11-04 (×2): 12.5 g via INTRAVENOUS

## 2019-11-04 MED ORDER — PANTOPRAZOLE SODIUM 40 MG PO TBEC
40.0000 mg | DELAYED_RELEASE_TABLET | Freq: Every day | ORAL | Status: DC
  Administered 2019-11-06 – 2019-11-10 (×5): 40 mg via ORAL
  Filled 2019-11-04 (×5): qty 1

## 2019-11-04 MED ORDER — MIDAZOLAM HCL 5 MG/5ML IJ SOLN
INTRAMUSCULAR | Status: DC | PRN
  Administered 2019-11-04 (×2): 1 mg via INTRAVENOUS
  Administered 2019-11-04: 4 mg via INTRAVENOUS
  Administered 2019-11-04: 2 mg via INTRAVENOUS

## 2019-11-04 MED ORDER — SODIUM CHLORIDE 0.9% FLUSH: 3.0000 mL | INTRAVENOUS | Status: DC | PRN

## 2019-11-04 MED ORDER — INSULIN REGULAR(HUMAN) IN NACL 100-0.9 UT/100ML-% IV SOLN: INTRAVENOUS | Status: DC

## 2019-11-04 MED ORDER — CHLORHEXIDINE GLUCONATE 0.12 % MT SOLN
15.0000 mL | OROMUCOSAL | Status: AC
  Administered 2019-11-04: 15 mL via OROMUCOSAL

## 2019-11-04 MED ORDER — ROCURONIUM BROMIDE 10 MG/ML (PF) SYRINGE
PREFILLED_SYRINGE | INTRAVENOUS | Status: AC
  Filled 2019-11-04: qty 10

## 2019-11-04 MED ORDER — THROMBIN (RECOMBINANT) 20000 UNITS EX SOLR
CUTANEOUS | Status: AC
  Filled 2019-11-04: qty 20000

## 2019-11-04 MED ORDER — DEXTROSE 50 % IV SOLN: 0.0000 mL | INTRAVENOUS | Status: DC | PRN

## 2019-11-04 MED ORDER — PHENYLEPHRINE HCL (PRESSORS) 10 MG/ML IV SOLN
INTRAVENOUS | Status: AC
  Filled 2019-11-04: qty 2

## 2019-11-04 MED ORDER — ARTIFICIAL TEARS OPHTHALMIC OINT
TOPICAL_OINTMENT | OPHTHALMIC | Status: DC | PRN
  Administered 2019-11-04: 1 via OPHTHALMIC

## 2019-11-04 MED ORDER — EPHEDRINE 5 MG/ML INJ
INTRAVENOUS | Status: AC
  Filled 2019-11-04: qty 10

## 2019-11-04 MED ORDER — LEVOFLOXACIN IN D5W 750 MG/150ML IV SOLN
750.0000 mg | INTRAVENOUS | Status: AC
  Administered 2019-11-05: 750 mg via INTRAVENOUS
  Filled 2019-11-04: qty 150

## 2019-11-04 MED ORDER — PROTAMINE SULFATE 10 MG/ML IV SOLN
INTRAVENOUS | Status: DC | PRN
  Administered 2019-11-04: 250 mg via INTRAVENOUS
  Administered 2019-11-04: 20 mg via INTRAVENOUS

## 2019-11-04 MED ORDER — FAMOTIDINE IN NACL 20-0.9 MG/50ML-% IV SOLN
20.0000 mg | Freq: Two times a day (BID) | INTRAVENOUS | Status: AC
  Administered 2019-11-04 (×2): 20 mg via INTRAVENOUS
  Filled 2019-11-04 (×2): qty 50

## 2019-11-04 MED ORDER — ASPIRIN 81 MG PO CHEW
324.0000 mg | CHEWABLE_TABLET | Freq: Every day | ORAL | Status: DC
  Filled 2019-11-04 (×2): qty 4

## 2019-11-04 MED ORDER — TRAMADOL HCL 50 MG PO TABS: 50.0000 mg | ORAL_TABLET | ORAL | Status: DC | PRN

## 2019-11-04 MED ORDER — ORAL CARE MOUTH RINSE
15.0000 mL | Freq: Two times a day (BID) | OROMUCOSAL | Status: DC
  Administered 2019-11-04 – 2019-11-10 (×9): 15 mL via OROMUCOSAL

## 2019-11-04 MED ORDER — SODIUM CHLORIDE 0.9% FLUSH
3.0000 mL | Freq: Two times a day (BID) | INTRAVENOUS | Status: DC
  Administered 2019-11-05 – 2019-11-09 (×9): 3 mL via INTRAVENOUS

## 2019-11-04 MED ORDER — PHENYLEPHRINE HCL-NACL 10-0.9 MG/250ML-% IV SOLN
INTRAVENOUS | Status: DC | PRN
  Administered 2019-11-04: 30 ug/min via INTRAVENOUS

## 2019-11-04 MED ORDER — METOPROLOL TARTRATE 25 MG/10 ML ORAL SUSPENSION: 12.5000 mg | Freq: Two times a day (BID) | ORAL | Status: DC

## 2019-11-04 MED ORDER — SODIUM CHLORIDE 0.9 % IV SOLN: INTRAVENOUS | Status: DC

## 2019-11-04 MED ORDER — DEXMEDETOMIDINE HCL IN NACL 400 MCG/100ML IV SOLN
0.0000 ug/kg/h | INTRAVENOUS | Status: DC
  Filled 2019-11-04: qty 100

## 2019-11-04 MED ORDER — CHLORHEXIDINE GLUCONATE CLOTH 2 % EX PADS
6.0000 | MEDICATED_PAD | Freq: Every day | CUTANEOUS | Status: DC
  Administered 2019-11-04 – 2019-11-10 (×7): 6 via TOPICAL

## 2019-11-04 MED ORDER — VANCOMYCIN HCL IN DEXTROSE 1-5 GM/200ML-% IV SOLN
1000.0000 mg | Freq: Once | INTRAVENOUS | Status: AC
  Administered 2019-11-04: 1000 mg via INTRAVENOUS
  Filled 2019-11-04: qty 200

## 2019-11-04 MED ORDER — SODIUM CHLORIDE 0.9 % IV SOLN: 250.0000 mL | INTRAVENOUS | Status: DC

## 2019-11-04 MED ORDER — ACETAMINOPHEN 650 MG RE SUPP
650.0000 mg | Freq: Once | RECTAL | Status: AC
  Administered 2019-11-04: 650 mg via RECTAL

## 2019-11-04 MED ORDER — PROTAMINE SULFATE 10 MG/ML IV SOLN
INTRAVENOUS | Status: AC
  Filled 2019-11-04: qty 25

## 2019-11-04 MED ORDER — LACTATED RINGERS IV SOLN: INTRAVENOUS | Status: DC | PRN

## 2019-11-04 MED ORDER — LACTATED RINGERS IV SOLN: INTRAVENOUS | Status: DC

## 2019-11-04 MED ORDER — LACTATED RINGERS IV SOLN: 500.0000 mL | Freq: Once | INTRAVENOUS | Status: DC | PRN

## 2019-11-04 MED ORDER — ROCURONIUM BROMIDE 10 MG/ML (PF) SYRINGE
PREFILLED_SYRINGE | INTRAVENOUS | Status: DC | PRN
  Administered 2019-11-04: 60 mg via INTRAVENOUS
  Administered 2019-11-04: 40 mg via INTRAVENOUS
  Administered 2019-11-04 (×2): 50 mg via INTRAVENOUS

## 2019-11-04 MED ORDER — SODIUM CHLORIDE 0.45 % IV SOLN: INTRAVENOUS | Status: DC | PRN

## 2019-11-04 MED ORDER — ACETAMINOPHEN 160 MG/5ML PO SOLN: 1000.0000 mg | Freq: Four times a day (QID) | ORAL | Status: AC

## 2019-11-04 MED ORDER — THROMBIN 20000 UNITS EX SOLR
CUTANEOUS | Status: DC | PRN
  Administered 2019-11-04: 20000 [IU] via TOPICAL

## 2019-11-04 MED ORDER — MAGNESIUM SULFATE 4 GM/100ML IV SOLN
4.0000 g | Freq: Once | INTRAVENOUS | Status: AC
  Administered 2019-11-04: 14:00:00 4 g via INTRAVENOUS
  Filled 2019-11-04: qty 100

## 2019-11-04 MED ORDER — FENTANYL CITRATE (PF) 250 MCG/5ML IJ SOLN
INTRAMUSCULAR | Status: DC | PRN
  Administered 2019-11-04: 25 ug via INTRAVENOUS
  Administered 2019-11-04: 200 ug via INTRAVENOUS
  Administered 2019-11-04: 100 ug via INTRAVENOUS
  Administered 2019-11-04: 500 ug via INTRAVENOUS
  Administered 2019-11-04: 25 ug via INTRAVENOUS
  Administered 2019-11-04: 100 ug via INTRAVENOUS
  Administered 2019-11-04 (×2): 150 ug via INTRAVENOUS

## 2019-11-04 MED ORDER — ONDANSETRON HCL 4 MG/2ML IJ SOLN
4.0000 mg | Freq: Four times a day (QID) | INTRAMUSCULAR | Status: DC | PRN
  Administered 2019-11-04 – 2019-11-06 (×4): 4 mg via INTRAVENOUS
  Filled 2019-11-04 (×4): qty 2

## 2019-11-04 MED ORDER — HEPARIN SODIUM (PORCINE) 1000 UNIT/ML IJ SOLN
INTRAMUSCULAR | Status: DC | PRN
  Administered 2019-11-04: 27000 [IU] via INTRAVENOUS

## 2019-11-04 MED ORDER — HEPARIN SODIUM (PORCINE) 1000 UNIT/ML IJ SOLN
INTRAMUSCULAR | Status: AC
  Filled 2019-11-04: qty 1

## 2019-11-04 MED ORDER — SODIUM CHLORIDE 0.9% FLUSH
10.0000 mL | Freq: Two times a day (BID) | INTRAVENOUS | Status: DC
  Administered 2019-11-04 – 2019-11-07 (×6): 10 mL

## 2019-11-04 MED ORDER — NITROGLYCERIN IN D5W 200-5 MCG/ML-% IV SOLN: 0.0000 ug/min | INTRAVENOUS | Status: DC

## 2019-11-04 MED ORDER — CHLORHEXIDINE GLUCONATE 0.12% ORAL RINSE (MEDLINE KIT)
15.0000 mL | Freq: Two times a day (BID) | OROMUCOSAL | Status: DC
  Administered 2019-11-04: 15 mL via OROMUCOSAL

## 2019-11-04 MED ORDER — ASPIRIN EC 325 MG PO TBEC
325.0000 mg | DELAYED_RELEASE_TABLET | Freq: Every day | ORAL | Status: DC
  Administered 2019-11-05 – 2019-11-10 (×6): 325 mg via ORAL
  Filled 2019-11-04 (×6): qty 1

## 2019-11-04 MED ORDER — PHENYLEPHRINE 40 MCG/ML (10ML) SYRINGE FOR IV PUSH (FOR BLOOD PRESSURE SUPPORT)
PREFILLED_SYRINGE | INTRAVENOUS | Status: DC | PRN
  Administered 2019-11-04 (×3): 40 ug via INTRAVENOUS

## 2019-11-04 MED ORDER — ACETAMINOPHEN 160 MG/5ML PO SOLN: 650.0000 mg | Freq: Once | ORAL | Status: AC

## 2019-11-04 MED ORDER — SODIUM CHLORIDE 0.9% IV SOLUTION: Freq: Once | INTRAVENOUS | Status: AC

## 2019-11-04 MED ORDER — METOPROLOL TARTRATE 12.5 MG HALF TABLET: 12.5000 mg | ORAL_TABLET | Freq: Two times a day (BID) | ORAL | Status: DC

## 2019-11-04 MED ORDER — FENTANYL CITRATE (PF) 250 MCG/5ML IJ SOLN
INTRAMUSCULAR | Status: AC
  Filled 2019-11-04: qty 25

## 2019-11-04 MED ORDER — SODIUM BICARBONATE 8.4 % IV SOLN
50.0000 meq | Freq: Once | INTRAVENOUS | Status: AC
  Administered 2019-11-04: 50 meq via INTRAVENOUS

## 2019-11-04 MED ORDER — CALCIUM CHLORIDE 10 % IV SOLN
1.0000 g | Freq: Once | INTRAVENOUS | Status: AC
  Administered 2019-11-04: 1 g via INTRAVENOUS

## 2019-11-04 MED ORDER — ORAL CARE MOUTH RINSE
15.0000 mL | OROMUCOSAL | Status: DC
  Administered 2019-11-04 (×2): 15 mL via OROMUCOSAL

## 2019-11-04 MED ORDER — PHENYLEPHRINE HCL-NACL 20-0.9 MG/250ML-% IV SOLN: 0.0000 ug/min | INTRAVENOUS | Status: DC

## 2019-11-04 MED ORDER — ALBUMIN HUMAN 5 % IV SOLN: INTRAVENOUS | Status: DC | PRN

## 2019-11-04 MED ORDER — ACETAMINOPHEN 500 MG PO TABS
1000.0000 mg | ORAL_TABLET | Freq: Four times a day (QID) | ORAL | Status: AC
  Administered 2019-11-04 – 2019-11-09 (×15): 1000 mg via ORAL
  Filled 2019-11-04 (×19): qty 2

## 2019-11-04 MED ORDER — SODIUM CHLORIDE 0.9% FLUSH
10.0000 mL | INTRAVENOUS | Status: DC | PRN
  Administered 2019-11-05: 30 mL

## 2019-11-04 MED ORDER — 0.9 % SODIUM CHLORIDE (POUR BTL) OPTIME
TOPICAL | Status: DC | PRN
  Administered 2019-11-04: 5000 mL

## 2019-11-04 MED ORDER — BISACODYL 10 MG RE SUPP: 10.0000 mg | Freq: Every day | RECTAL | Status: DC

## 2019-11-04 MED ORDER — DOCUSATE SODIUM 100 MG PO CAPS
200.0000 mg | ORAL_CAPSULE | Freq: Every day | ORAL | Status: DC
  Administered 2019-11-05 – 2019-11-07 (×3): 200 mg via ORAL
  Filled 2019-11-04 (×4): qty 2

## 2019-11-04 MED ORDER — ARTIFICIAL TEARS OPHTHALMIC OINT
TOPICAL_OINTMENT | OPHTHALMIC | Status: AC
  Filled 2019-11-04: qty 3.5

## 2019-11-04 MED ORDER — PROPOFOL 10 MG/ML IV BOLUS
INTRAVENOUS | Status: AC
  Filled 2019-11-04: qty 20

## 2019-11-04 MED ORDER — LIDOCAINE 2% (20 MG/ML) 5 ML SYRINGE
INTRAMUSCULAR | Status: AC
  Filled 2019-11-04: qty 5

## 2019-11-04 MED ORDER — SODIUM CHLORIDE 0.9% IV SOLUTION: Freq: Once | INTRAVENOUS | Status: DC

## 2019-11-04 MED ORDER — HEMOSTATIC AGENTS (NO CHARGE) OPTIME
TOPICAL | Status: DC | PRN
  Administered 2019-11-04 (×2): 1 via TOPICAL

## 2019-11-04 MED ORDER — MIDAZOLAM HCL 2 MG/2ML IJ SOLN: 2.0000 mg | INTRAMUSCULAR | Status: DC | PRN

## 2019-11-04 MED ORDER — SUCCINYLCHOLINE CHLORIDE 200 MG/10ML IV SOSY
PREFILLED_SYRINGE | INTRAVENOUS | Status: AC
  Filled 2019-11-04: qty 10

## 2019-11-04 MED ORDER — LIDOCAINE 2% (20 MG/ML) 5 ML SYRINGE
INTRAMUSCULAR | Status: DC | PRN
  Administered 2019-11-04: 100 mg via INTRAVENOUS

## 2019-11-04 MED ORDER — OXYCODONE HCL 5 MG PO TABS
5.0000 mg | ORAL_TABLET | ORAL | Status: DC | PRN
  Administered 2019-11-04 – 2019-11-05 (×2): 10 mg via ORAL
  Administered 2019-11-06 – 2019-11-10 (×2): 7.5 mg via ORAL
  Filled 2019-11-04: qty 1
  Filled 2019-11-04: qty 2
  Filled 2019-11-04: qty 1
  Filled 2019-11-04 (×5): qty 2

## 2019-11-04 MED ORDER — MORPHINE SULFATE (PF) 2 MG/ML IV SOLN
1.0000 mg | INTRAVENOUS | Status: DC | PRN
  Administered 2019-11-04 – 2019-11-06 (×5): 2 mg via INTRAVENOUS
  Filled 2019-11-04 (×5): qty 1

## 2019-11-04 MED ORDER — PHENYLEPHRINE 40 MCG/ML (10ML) SYRINGE FOR IV PUSH (FOR BLOOD PRESSURE SUPPORT)
PREFILLED_SYRINGE | INTRAVENOUS | Status: AC
  Filled 2019-11-04: qty 10

## 2019-11-04 MED ORDER — THROMBIN 20000 UNITS EX SOLR
OROMUCOSAL | Status: DC | PRN
  Administered 2019-11-04 (×5): 4 mL via TOPICAL

## 2019-11-04 MED ORDER — BISACODYL 5 MG PO TBEC
10.0000 mg | DELAYED_RELEASE_TABLET | Freq: Every day | ORAL | Status: DC
  Administered 2019-11-05 – 2019-11-07 (×3): 10 mg via ORAL
  Filled 2019-11-04 (×3): qty 2

## 2019-11-04 MED FILL — Verapamil HCl IV Soln 2.5 MG/ML: INTRAVENOUS | Qty: 2 | Status: AC

## 2019-11-04 MED FILL — Heparin Sodium (Porcine) Inj 1000 Unit/ML: INTRAMUSCULAR | Qty: 30 | Status: AC

## 2019-11-04 MED FILL — Potassium Chloride Inj 2 mEq/ML: INTRAVENOUS | Qty: 40 | Status: AC

## 2019-11-04 MED FILL — Magnesium Sulfate Inj 50%: INTRAMUSCULAR | Qty: 10 | Status: AC

## 2019-11-04 SURGICAL SUPPLY — 109 items
ADH SKN CLS APL DERMABOND .7 (GAUZE/BANDAGES/DRESSINGS) ×2
BAG DECANTER FOR FLEXI CONT (MISCELLANEOUS) ×3 IMPLANT
BASKET HEART (ORDER IN 25'S) (MISCELLANEOUS) ×3
BASKET HEART (ORDER IN 25S) (MISCELLANEOUS) ×2 IMPLANT
BLADE CLIPPER SURG (BLADE) ×2 IMPLANT
BLADE STERNUM SYSTEM 6 (BLADE) ×3 IMPLANT
BNDG ELASTIC 4X5.8 VLCR STR LF (GAUZE/BANDAGES/DRESSINGS) ×3 IMPLANT
BNDG ELASTIC 6X5.8 VLCR STR LF (GAUZE/BANDAGES/DRESSINGS) ×3 IMPLANT
BNDG GAUZE ELAST 4 BULKY (GAUZE/BANDAGES/DRESSINGS) ×3 IMPLANT
CANISTER SUCT 3000ML PPV (MISCELLANEOUS) ×3 IMPLANT
CATH ROBINSON RED A/P 18FR (CATHETERS) ×6 IMPLANT
CATH THORACIC 28FR (CATHETERS) ×5 IMPLANT
CATH THORACIC 36FR (CATHETERS) ×3 IMPLANT
CATH THORACIC 36FR RT ANG (CATHETERS) ×3 IMPLANT
CLIP VESOCCLUDE MED 24/CT (CLIP) IMPLANT
CLIP VESOCCLUDE SM WIDE 24/CT (CLIP) ×1 IMPLANT
DERMABOND ADVANCED (GAUZE/BANDAGES/DRESSINGS) ×1
DERMABOND ADVANCED .7 DNX12 (GAUZE/BANDAGES/DRESSINGS) ×1 IMPLANT
DRAPE CARDIOVASCULAR INCISE (DRAPES) ×3
DRAPE SLUSH/WARMER DISC (DRAPES) ×3 IMPLANT
DRAPE SRG 135X102X78XABS (DRAPES) ×2 IMPLANT
DRSG COVADERM 4X14 (GAUZE/BANDAGES/DRESSINGS) ×3 IMPLANT
ELECT CAUTERY BLADE 6.4 (BLADE) ×3 IMPLANT
ELECT REM PT RETURN 9FT ADLT (ELECTROSURGICAL) ×6
ELECTRODE REM PT RTRN 9FT ADLT (ELECTROSURGICAL) ×4 IMPLANT
FELT TEFLON 1X6 (MISCELLANEOUS) ×4 IMPLANT
GAUZE SPONGE 4X4 12PLY STRL (GAUZE/BANDAGES/DRESSINGS) ×6 IMPLANT
GAUZE SPONGE 4X4 12PLY STRL LF (GAUZE/BANDAGES/DRESSINGS) ×2 IMPLANT
GLOVE BIO SURGEON STRL SZ 6 (GLOVE) ×8 IMPLANT
GLOVE BIO SURGEON STRL SZ 6.5 (GLOVE) ×6 IMPLANT
GLOVE BIO SURGEON STRL SZ7 (GLOVE) IMPLANT
GLOVE BIO SURGEON STRL SZ7.5 (GLOVE) ×2 IMPLANT
GLOVE BIOGEL PI IND STRL 6 (GLOVE) IMPLANT
GLOVE BIOGEL PI IND STRL 6.5 (GLOVE) IMPLANT
GLOVE BIOGEL PI IND STRL 7.0 (GLOVE) ×1 IMPLANT
GLOVE BIOGEL PI IND STRL 8.5 (GLOVE) IMPLANT
GLOVE BIOGEL PI INDICATOR 6 (GLOVE) ×1
GLOVE BIOGEL PI INDICATOR 6.5 (GLOVE) ×2
GLOVE BIOGEL PI INDICATOR 7.0 (GLOVE) ×1
GLOVE BIOGEL PI INDICATOR 8.5 (GLOVE) ×1
GLOVE ORTHO TXT STRL SZ7.5 (GLOVE) IMPLANT
GLOVE SS BIOGEL STRL SZ 7 (GLOVE) ×4 IMPLANT
GLOVE SUPERSENSE BIOGEL SZ 7 (GLOVE) ×3
GOWN STRL REUS W/ TWL LRG LVL3 (GOWN DISPOSABLE) ×8 IMPLANT
GOWN STRL REUS W/ TWL XL LVL3 (GOWN DISPOSABLE) ×2 IMPLANT
GOWN STRL REUS W/TWL LRG LVL3 (GOWN DISPOSABLE) ×27
GOWN STRL REUS W/TWL XL LVL3 (GOWN DISPOSABLE) ×6
HEMOSTAT POWDER SURGIFOAM 1G (HEMOSTASIS) ×11 IMPLANT
HEMOSTAT SURGICEL 2X14 (HEMOSTASIS) ×3 IMPLANT
INSERT FOGARTY 61MM (MISCELLANEOUS) IMPLANT
INSERT FOGARTY XLG (MISCELLANEOUS) IMPLANT
KIT BASIN OR (CUSTOM PROCEDURE TRAY) ×3 IMPLANT
KIT CATH CPB BARTLE (MISCELLANEOUS) ×3 IMPLANT
KIT SUCTION CATH 14FR (SUCTIONS) ×3 IMPLANT
KIT TURNOVER KIT B (KITS) ×3 IMPLANT
KIT VASOVIEW HEMOPRO 2 VH 4000 (KITS) ×3 IMPLANT
NS IRRIG 1000ML POUR BTL (IV SOLUTION) ×15 IMPLANT
PACK E OPEN HEART (SUTURE) ×3 IMPLANT
PACK OPEN HEART (CUSTOM PROCEDURE TRAY) ×3 IMPLANT
PAD ARMBOARD 7.5X6 YLW CONV (MISCELLANEOUS) ×2 IMPLANT
PAD ELECT DEFIB RADIOL ZOLL (MISCELLANEOUS) ×3 IMPLANT
PENCIL BUTTON HOLSTER BLD 10FT (ELECTRODE) ×3 IMPLANT
POSITIONER HEAD DONUT 9IN (MISCELLANEOUS) ×3 IMPLANT
PUNCH AORTIC ROTATE 4.0MM (MISCELLANEOUS) IMPLANT
PUNCH AORTIC ROTATE 4.5MM 8IN (MISCELLANEOUS) ×3 IMPLANT
PUNCH AORTIC ROTATE 5MM 8IN (MISCELLANEOUS) IMPLANT
SEALANT SURG COSEAL 4ML (VASCULAR PRODUCTS) ×2 IMPLANT
SET CARDIOPLEGIA MPS 5001102 (MISCELLANEOUS) ×3 IMPLANT
SPONGE INTESTINAL PEANUT (DISPOSABLE) IMPLANT
SPONGE LAP 18X18 RF (DISPOSABLE) ×4 IMPLANT
SPONGE LAP 4X18 RFD (DISPOSABLE) ×3 IMPLANT
SUT BONE WAX W31G (SUTURE) ×3 IMPLANT
SUT MNCRL AB 4-0 PS2 18 (SUTURE) IMPLANT
SUT PROLENE 3 0 SH DA (SUTURE) IMPLANT
SUT PROLENE 3 0 SH1 36 (SUTURE) ×3 IMPLANT
SUT PROLENE 4 0 RB 1 (SUTURE)
SUT PROLENE 4 0 SH DA (SUTURE) IMPLANT
SUT PROLENE 4-0 RB1 .5 CRCL 36 (SUTURE) IMPLANT
SUT PROLENE 5 0 C 1 36 (SUTURE) IMPLANT
SUT PROLENE 6 0 C 1 30 (SUTURE) ×2 IMPLANT
SUT PROLENE 7 0 BV 1 (SUTURE) IMPLANT
SUT PROLENE 7 0 BV1 MDA (SUTURE) ×4 IMPLANT
SUT PROLENE 8 0 BV175 6 (SUTURE) IMPLANT
SUT SILK  1 MH (SUTURE) ×3
SUT SILK 1 MH (SUTURE) IMPLANT
SUT SILK 2 0 SH (SUTURE) IMPLANT
SUT STEEL 6MS V (SUTURE) ×2 IMPLANT
SUT STEEL STERNAL CCS#1 18IN (SUTURE) IMPLANT
SUT STEEL SZ 6 DBL 3X14 BALL (SUTURE) IMPLANT
SUT VIC AB 1 CTX 36 (SUTURE) ×6
SUT VIC AB 1 CTX36XBRD ANBCTR (SUTURE) ×4 IMPLANT
SUT VIC AB 2-0 CT1 27 (SUTURE) ×3
SUT VIC AB 2-0 CT1 TAPERPNT 27 (SUTURE) ×1 IMPLANT
SUT VIC AB 2-0 CTX 27 (SUTURE) IMPLANT
SUT VIC AB 3-0 SH 27 (SUTURE)
SUT VIC AB 3-0 SH 27X BRD (SUTURE) IMPLANT
SUT VIC AB 3-0 X1 27 (SUTURE) ×2 IMPLANT
SUT VICRYL 4-0 PS2 18IN ABS (SUTURE) IMPLANT
SYSTEM SAHARA CHEST DRAIN ATS (WOUND CARE) ×3 IMPLANT
SYSTEM SAHARA CHEST DRAIN RE-I (WOUND CARE) ×2 IMPLANT
TAPE CLOTH SURG 4X10 WHT LF (GAUZE/BANDAGES/DRESSINGS) ×2 IMPLANT
TAPE PAPER 3X10 WHT MICROPORE (GAUZE/BANDAGES/DRESSINGS) ×2 IMPLANT
TOWEL GREEN STERILE (TOWEL DISPOSABLE) ×3 IMPLANT
TOWEL GREEN STERILE FF (TOWEL DISPOSABLE) ×3 IMPLANT
TRAY FOLEY SLVR 16FR TEMP STAT (SET/KITS/TRAYS/PACK) ×3 IMPLANT
TUBING LAP HI FLOW INSUFFLATIO (TUBING) ×3 IMPLANT
UNDERPAD 30X30 (UNDERPADS AND DIAPERS) ×3 IMPLANT
WATER STERILE IRR 1000ML POUR (IV SOLUTION) ×6 IMPLANT
YANKAUER SUCT BULB TIP NO VENT (SUCTIONS) ×2 IMPLANT

## 2019-11-04 NOTE — Procedures (Signed)
Extubation Procedure Note  Patient Details:   Name: CAYLAH OLMO DOB: 10-20-1940 MRN: [id number]   Airway Documentation:    Vent end date: 11/04/19 Vent end time: 2100   Evaluation  O2 sats: stable throughout Complications: No apparent complications Patient did tolerate procedure well. Bilateral Breath Sounds: Clear, Diminished   Yes   Positive cuff leak noted prior to extubation, NIF -30 and VC of .8L. Patient placed on 4L [place]  Kiyan Burmester R Jennise Both 11/04/2019, 9:02 PM

## 2019-11-04 NOTE — Transfer of Care (Signed)
Immediate Anesthesia Transfer of Care Note  Patient: Yolanda Flowers  Procedure(s) Performed: CORONARY ARTERY BYPASS GRAFTING (CABG) x 4, with ENDOSCOPIC HARVESTING OF RIGHT GREATER SAPHENOUS VEIN. (N/A Chest) TRANSESOPHAGEAL ECHOCARDIOGRAM (TEE) (N/A )  Patient Location: SICU  Anesthesia Type:General  Level of Consciousness: sedated, unresponsive and Patient remains intubated per anesthesia plan  Airway & Oxygen Therapy: Patient remains intubated per anesthesia plan and Patient placed on Ventilator (see vital sign flow sheet for setting)  Post-op Assessment: Report given to RN and Post -op Vital signs reviewed and stable  Post vital signs: Reviewed and stable  Last Vitals:  Vitals Value Taken Time  BP 101/75 11/04/19 1330  Temp 36 C 11/04/19 1331  Pulse 80 11/04/19 1331  Resp 19 11/04/19 1331  SpO2 99 % 11/04/19 1331  Vitals shown include unvalidated device data.  Last Pain:  Vitals:   11/04/19 0415  TempSrc: Oral  PainSc:       Patients Stated Pain Goal: 3 ([id number])  Complications: No apparent anesthesia complications

## 2019-11-04 NOTE — Op Note (Signed)
CARDIOVASCULAR SURGERY OPERATIVE NOTE  11/04/2019  Surgeon:  Gaye Pollack, MD  First Assistant: Jadene Pierini,  PA-C   Preoperative Diagnosis:  Severe left main and multi-vessel coronary artery disease   Postoperative Diagnosis:  Same   Procedure:  1. Median Sternotomy 2. Extracorporeal circulation 3.   Coronary artery bypass grafting x 4   Left internal mammary artery graft to the LAD  SVG to second diagonal  SVG to OM1  SVG to OM2 4.   Endoscopic vein harvest from the right leg   Anesthesia:  General Endotracheal   Clinical History/Surgical Indication:  This 79 year old woman has a couple year history of exertional chest pain and shortness of breath that she says was worked up before at [facility] in 06/2018 with a Arnold cardiolyte which showed no inducible ischemia. She has now had progressively worsening symptoms occurring with minimal exertion. Cath today shows severe multivessel CAD with an eccentric calcified distal LM stenosis that extends into the ostium of the LCX and compromises the LAD. There is a medium caliber diagonal that is occluded, large single OM. RCA has no significant stenosis. I agree that CABG is indicated. I discussed the operative procedure with the patient and family including alternatives, benefits and risks; including but not limited to bleeding, blood transfusion, infection, stroke, myocardial infarction, graft failure, heart block requiring a permanent pacemaker, organ dysfunction, and death.  Yolanda Flowers understands and agrees to proceed.  Preparation:  The patient was seen in the preoperative holding area and the correct patient, correct operation were confirmed with the patient after reviewing the medical record and catheterization. The consent was signed by me. Preoperative antibiotics were given. A pulmonary arterial line and radial  arterial line were placed by the anesthesia team. The patient was taken back to the operating room and positioned supine on the operating room table. After being placed under general endotracheal anesthesia by the anesthesia team a foley catheter was placed. The neck, chest, abdomen, and both legs were prepped with betadine soap and solution and draped in the usual sterile manner. A surgical time-out was taken and the correct patient and operative procedure were confirmed with the nursing and anesthesia staff.  TEE performed by Dr. Annye Asa. This showed normal LV systolic function. No significant valvular disease.  Cardiopulmonary Bypass:  A median sternotomy was performed. The pericardium was opened in the midline. Right ventricular function appeared normal. The ascending aorta was of normal size and had no palpable plaque. There were no contraindications to aortic cannulation or cross-clamping. The patient was fully systemically heparinized and the ACT was maintained > 400 sec. The proximal aortic arch was cannulated with a 20 F aortic cannula for arterial inflow. Venous cannulation was performed via the right atrial appendage using a two-staged venous cannula. An antegrade cardioplegia/vent cannula was inserted into the mid-ascending aorta. Aortic occlusion was performed with a single cross-clamp. Systemic cooling to 32 degrees Centigrade and topical cooling of the heart with iced saline were used. Hyperkalemic antegrade cold blood cardioplegia was used to induce diastolic arrest and was then given at about 20 minute intervals throughout the period of arrest to maintain myocardial temperature at or below 10 degrees centigrade. A temperature probe was inserted into the interventricular septum and an insulating pad was placed in the pericardium.   Left internal mammary harvest:  The left side of the sternum was retracted using the Rultract retractor. The left internal mammary artery was harvested  as a pedicle graft. All  side branches were clipped. It was a medium-sized vessel of good quality with excellent blood flow. It was ligated distally and divided. It was sprayed with topical papaverine solution to prevent vasospasm.   Endoscopic vein harvest:  The right greater saphenous vein was harvested endoscopically through a 2 cm incision medial to the right knee. It was harvested from the upper thigh to below the knee. It was a medium-sized vein of good quality. The side branches were all ligated with 4-0 silk ties.    Coronary arteries:  The coronary arteries were examined.   LAD:  Tortuous and intramyocardial in proximal half but visible in distal half and had no disease. Second diagonal was small but long and graftable. The first diagonal was small.  LCX:  Large OM1 that was visible proximally just after the area of proximal stenosis. It then became intramyocardial. OM2 was a medium caliber vessel proximally before it divided into small distal branches.  RCA:  Diffusely diseased with plaque but no significant stenosis on cath.   Grafts:   1. LIMA to the LAD: 2.5 mm. It was sewn end to side using 8-0 prolene continuous suture. 2. SVG to diagonal 2:  1.5 mm. It was sewn end to side using 7-0 prolene continuous suture. 3. SVG to OM1:  2.0 mm. It was sewn end to side using 7-0 prolene continuous suture. 4. SVG to OM2:  1.6 mm. It was sewn end to side using 7-0 prolene continuous suture.  The proximal vein graft anastomoses were performed to the mid-ascending aorta using continuous 6-0 prolene suture. Graft markers were placed around the proximal anastomoses.   Completion:  The patient was rewarmed to 37 degrees Centigrade. The clamp was removed from the LIMA pedicle and there was rapid warming of the septum and return of ventricular fibrillation. The crossclamp was removed with a time of 101 minutes. There was spontaneous return of sinus rhythm. The distal and proximal anastomoses  were checked for hemostasis. The position of the grafts was satisfactory. Two temporary epicardial pacing wires were placed on the right atrium and two on the right ventricle. The patient was weaned from CPB without difficulty on no inotropes. CPB time was 125 minutes. Cardiac output was 5 LPM. TEE showed normal LV systolic function. Heparin was fully reversed with protamine and the aortic and venous cannulas removed. Hemostasis was achieved. Mediastinal and bilateral pleural drainage tubes were placed. The sternum was closed with #6 stainless steel wires. The fascia was closed with continuous # 1 vicryl suture. The subcutaneous tissue was closed with 2-0 vicryl continuous suture. The skin was closed with 3-0 vicryl subcuticular suture. All sponge, needle, and instrument counts were reported correct at the end of the case. Dry sterile dressings were placed over the incisions and around the chest tubes which were connected to pleurevac suction. The patient was then transported to the surgical intensive care unit in  stable condition.

## 2019-11-04 NOTE — Telephone Encounter (Signed)
All labs were routed via Epic.

## 2019-11-04 NOTE — Brief Op Note (Signed)
11/03/2019 - 11/04/2019  7:54 AM  PATIENT:  Elwyn Lade [place]  79 y.o. female  PRE-OPERATIVE DIAGNOSIS:  CAD  POST-OPERATIVE DIAGNOSIS:  CAD  PROCEDURE:  Procedure(s): CORONARY ARTERY BYPASS GRAFTING (CABG) x 4, with ENDOSCOPIC HARVESTING OF RIGHT GREATER SAPHENOUS VEIN. (N/A) TRANSESOPHAGEAL ECHOCARDIOGRAM (TEE) (N/A) LIMA-LAD SVG-OM1 SVG-OM2 SVD-DIAG  SURGEON:  Surgeon(s) and Role:    * Bartle, Fernande Boyden, MD - Primary  PHYSICIAN ASSISTANT: Timiko Offutt PA-c  ANESTHESIA:   general  EBL:  588 mL   BLOOD ADMINISTERED:2 units prbc's CC PRBC  DRAINS:  pleural and mediastinal ct's   LOCAL MEDICATIONS USED:  NONE  SPECIMEN:  No Specimen  DISPOSITION OF SPECIMEN:  N/A  COUNTS:  YES  TOURNIQUET:  * No tourniquets in log *  DICTATION: .Dragon Dictation  PLAN OF CARE: Admit to inpatient   PATIENT DISPOSITION:  ICU - intubated and hemodynamically stable.   Delay start of Pharmacological VTE agent (>24hrs) due to surgical blood loss or risk of bleeding: yes  COMPLICATIONS: NO KNOWN

## 2019-11-04 NOTE — Anesthesia Procedure Notes (Signed)
Arterial Line Insertion Start/End3/18/2021 6:40 AM, 11/04/2019 6:50 AM Performed by: Harden Mo, CRNA, CRNA  Patient location: Pre-op. Preanesthetic checklist: patient identified, IV checked, site marked, risks and benefits discussed, surgical consent, monitors and equipment checked, pre-op evaluation and anesthesia consent Lidocaine 1% used for infiltration Left, radial was placed Catheter size: 20 G Hand hygiene performed  and maximum sterile barriers used  Allen's test indicative of satisfactory collateral circulation Attempts: 1 Procedure performed without using ultrasound guided technique. Ultrasound Notes:anatomy identified, needle tip was noted to be adjacent to the nerve/plexus identified and no ultrasound evidence of intravascular and/or intraneural injection Following insertion, dressing applied and Biopatch. Post procedure assessment: normal  Patient tolerated the procedure well with no immediate complications.

## 2019-11-04 NOTE — Anesthesia Procedure Notes (Signed)
Central Venous Catheter Insertion Performed by: Roberts Gaudy, MD, anesthesiologist Start/End3/18/2021 6:00 AM, 11/04/2019 6:10 AM Patient location: Pre-op. Preanesthetic checklist: patient identified, IV checked, site marked, risks and benefits discussed, surgical consent, monitors and equipment checked, pre-op evaluation, timeout performed and anesthesia consent Lidocaine 1% used for infiltration and patient sedated Hand hygiene performed  and maximum sterile barriers used  Catheter size: 8.5 Fr Sheath introducer Procedure performed using ultrasound guided technique. Ultrasound Notes:anatomy identified, needle tip was noted to be adjacent to the nerve/plexus identified, no ultrasound evidence of intravascular and/or intraneural injection and image(s) printed for medical record Attempts: 1 Following insertion, line sutured and dressing applied. Post procedure assessment: blood return through all ports, free fluid flow and no air  Patient tolerated the procedure well with no immediate complications.

## 2019-11-04 NOTE — Anesthesia Postprocedure Evaluation (Signed)
Anesthesia Post Note  Patient: Yolanda Flowers  Procedure(s) Performed: CORONARY ARTERY BYPASS GRAFTING (CABG) x 4, with ENDOSCOPIC HARVESTING OF RIGHT GREATER SAPHENOUS VEIN. (N/A Chest) TRANSESOPHAGEAL ECHOCARDIOGRAM (TEE) (N/A )     Patient location during evaluation: SICU Anesthesia Type: General Level of consciousness: sedated and patient remains intubated per anesthesia plan Pain management: pain level controlled Vital Signs Assessment: post-procedure vital signs reviewed and stable Respiratory status: patient remains intubated per anesthesia plan and patient on ventilator - see flowsheet for VS (presently weaning vent, is tolerated CPAP well.) Cardiovascular status: stable (remains on low dose Phenylephrine infusion for BP support.) Postop Assessment: no apparent nausea or vomiting Anesthetic complications: no    Last Vitals:  Vitals:   11/04/19 1700 11/04/19 1800  BP:    Pulse: 94 93  Resp: 19 20  Temp: 36.7 C 36.8 C  SpO2: 100% 99%    Last Pain:  Vitals:   11/04/19 1400  TempSrc: Core  PainSc:                  Ivelise Castillo,E. Matheson [place]

## 2019-11-04 NOTE — Telephone Encounter (Signed)
Dr. Lauralee Evener office is calling to follow up in regards to lab results, stating they are unable to read the patient's lab results that were faxed to Dr. Lauralee Evener office. Please resend lab results to (fax #) [phone].

## 2019-11-04 NOTE — Anesthesia Procedure Notes (Signed)
Procedure Name: Intubation Date/Time: 11/04/2019 7:48 AM Performed by: Harden Mo, CRNA Pre-anesthesia Checklist: Patient identified, Emergency Drugs available, Suction available and Patient being monitored Patient Re-evaluated:Patient Re-evaluated prior to induction Oxygen Delivery Method: Circle System Utilized Preoxygenation: Pre-oxygenation with 100% oxygen Induction Type: IV induction Ventilation: Mask ventilation without difficulty and Oral airway inserted - appropriate to patient size Laryngoscope Size: Sabra Heck and 2 Grade View: Grade I Tube type: Oral Tube size: 8.0 mm Number of attempts: 1 Airway Equipment and Method: Stylet and Oral airway Placement Confirmation: ETT inserted through vocal cords under direct vision,  positive ETCO2 and breath sounds checked- equal and bilateral Secured at: 23 cm Tube secured with: Tape Dental Injury: Teeth and Oropharynx as per pre-operative assessment

## 2019-11-04 NOTE — Progress Notes (Signed)
[name], female   DOB: 04-12-1941, 79 y.o.   MRN: [id number] EVENING ROUNDS NOTE :     [address]       [place] [address]             [phone]                 Day of Surgery Procedure(s) (LRB): CORONARY ARTERY BYPASS GRAFTING (CABG) x 4, with ENDOSCOPIC HARVESTING OF RIGHT GREATER SAPHENOUS VEIN. (N/A) TRANSESOPHAGEAL ECHOCARDIOGRAM (TEE) (N/A)  Total Length of Stay:  LOS: 1 day  BP 123/82   Pulse 94   Temp 98.1 F (36.7 C)   Resp 19   Ht 5\' 5"  (1.651 m)   Wt 77.2 kg   SpO2 100%   BMI 28.31 kg/m   .Intake/Output      03/17 0701 - 03/18 0700 03/18 0701 - 03/19 0700   I.V. (mL/kg)  2250 (29.1)   Blood  932   IV Piggyback  850   Total Intake(mL/kg)  4032 (52.2)   Urine (mL/kg/hr) 350 (0.2) 2170 (2.5)   Emesis/NG output  100   Blood  588   Chest Tube  510   Total Output 350 3368   Net -350 +664        Urine Occurrence 2 x      . sodium chloride 20 mL/hr at 11/04/19 1345  . [START ON 11/05/2019] sodium chloride    . sodium chloride 20 mL/hr at 11/04/19 1325  . albumin human    . dexmedetomidine (PRECEDEX) IV infusion 0.7 mcg/kg/hr (11/04/19 1325)  . famotidine (PEPCID) IV 20 mg (11/04/19 1412)  . insulin 0.8 Units/hr (11/04/19 1325)  . lactated ringers    . lactated ringers    . lactated ringers 20 mL/hr at 11/04/19 1325  . [START ON 11/05/2019] levofloxacin (LEVAQUIN) IV    . magnesium sulfate 4 g (11/04/19 1401)  . nitroGLYCERIN 0 mcg/min (11/04/19 1325)  . phenylephrine (NEO-SYNEPHRINE) Adult infusion 20 mcg/min (11/04/19 1325)  . vancomycin       Lab Results  Component Value Date   WBC 19.8 (H) 11/04/2019   HGB 8.0 (L) 11/04/2019   HCT 25.0 (L) 11/04/2019   PLT 121 (L) 11/04/2019   GLUCOSE 94 11/04/2019   NA 144 11/04/2019   K 4.8 11/04/2019   CL 107 11/04/2019   CREATININE 1.07 (H) 11/04/2019   BUN 12 11/04/2019   CO2 24 11/04/2019   INR 1.5 (H) 11/04/2019   Sedated on vent Not bleeding- given ffp, plts  prbc in or    Grace Isaac MD  [place] [phone] Office [phone] 11/04/2019 6:15 PM

## 2019-11-04 NOTE — Progress Notes (Signed)
  Echocardiogram Echocardiogram Transesophageal has been performed.  Michiel Cowboy 11/04/2019, 8:50 AM

## 2019-11-04 NOTE — Anesthesia Procedure Notes (Signed)
Central Venous Catheter Insertion Performed by: Roberts Gaudy, MD, anesthesiologist Start/End3/18/2021 7:00 AM, 11/04/2019 7:10 AM Patient location: Pre-op. Preanesthetic checklist: patient identified, IV checked, site marked, risks and benefits discussed, surgical consent, monitors and equipment checked, pre-op evaluation, timeout performed and anesthesia consent Hand hygiene performed  and maximum sterile barriers used  PA cath was placed.Swan type:thermodilution Procedure performed without using ultrasound guided technique. Attempts: 1 Following insertion, line sutured, dressing applied and Biopatch. Post procedure assessment: blood return through all ports  Patient tolerated the procedure well with no immediate complications. Additional procedure comments: Secured at 45 cm.

## 2019-11-05 ENCOUNTER — Inpatient Hospital Stay (HOSPITAL_COMMUNITY): Payer: PPO

## 2019-11-05 LAB — BASIC METABOLIC PANEL
Anion gap: 10 (ref 5–15)
Anion gap: 11 (ref 5–15)
BUN/Creatinine Ratio: 11 — ABNORMAL LOW (ref 12–28)
BUN: 12 mg/dL (ref 8–27)
BUN: 14 mg/dL (ref 8–23)
BUN: 16 mg/dL (ref 8–23)
CO2: 23 mmol/L (ref 20–29)
CO2: 23 mmol/L (ref 22–32)
CO2: 24 mmol/L (ref 22–32)
Calcium: 9 mg/dL (ref 8.7–10.3)
Calcium: 7.9 mg/dL — ABNORMAL LOW (ref 8.9–10.3)
Calcium: 8 mg/dL — ABNORMAL LOW (ref 8.9–10.3)
Chloride: 106 mmol/L (ref 96–106)
Chloride: 111 mmol/L (ref 98–111)
Chloride: 105 mmol/L (ref 98–111)
Creatinine, Ser: 1.05 mg/dL — ABNORMAL HIGH (ref 0.57–1.00)
Creatinine, Ser: 1.09 mg/dL — ABNORMAL HIGH (ref 0.44–1.00)
Creatinine, Ser: 1.27 mg/dL — ABNORMAL HIGH (ref 0.44–1.00)
GFR calc Af Amer: 58 mL/min/{1.73_m2} — ABNORMAL LOW (ref >59)
GFR calc Af Amer: 56 mL/min — ABNORMAL LOW (ref >60)
GFR calc Af Amer: 46 mL/min — ABNORMAL LOW (ref >60)
GFR calc non Af Amer: 51 mL/min/{1.73_m2} — ABNORMAL LOW (ref >59)
GFR calc non Af Amer: 48 mL/min — ABNORMAL LOW (ref >60)
GFR calc non Af Amer: 40 mL/min — ABNORMAL LOW (ref >60)
Glucose, Bld: 134 mg/dL — ABNORMAL HIGH (ref 70–99)
Glucose, Bld: 113 mg/dL — ABNORMAL HIGH (ref 70–99)
Glucose: 85 mg/dL (ref 65–99)
Potassium: 4.5 mmol/L (ref 3.5–5.2)
Potassium: 4.5 mmol/L (ref 3.5–5.1)
Potassium: 3.8 mmol/L (ref 3.5–5.1)
Sodium: 142 mmol/L (ref 134–144)
Sodium: 144 mmol/L (ref 135–145)
Sodium: 140 mmol/L (ref 135–145)

## 2019-11-05 LAB — CBC
HCT: 22.3 % — ABNORMAL LOW (ref 36.0–46.0)
HCT: 29.6 % — ABNORMAL LOW (ref 36.0–46.0)
Hemoglobin: 7.2 g/dL — ABNORMAL LOW (ref 12.0–15.0)
Hemoglobin: 9.9 g/dL — ABNORMAL LOW (ref 12.0–15.0)
MCH: 33.8 pg (ref 26.0–34.0)
MCH: 31.9 pg (ref 26.0–34.0)
MCHC: 32.3 g/dL (ref 30.0–36.0)
MCHC: 33.4 g/dL (ref 30.0–36.0)
MCV: 104.7 fL — ABNORMAL HIGH (ref 80.0–100.0)
MCV: 95.5 fL (ref 80.0–100.0)
Platelets: 101 10*3/uL — ABNORMAL LOW (ref 150–400)
Platelets: 84 10*3/uL — ABNORMAL LOW (ref 150–400)
RBC: 2.13 MIL/uL — ABNORMAL LOW (ref 3.87–5.11)
RBC: 3.1 MIL/uL — ABNORMAL LOW (ref 3.87–5.11)
RDW: 17.3 % — ABNORMAL HIGH (ref 11.5–15.5)
RDW: 21 % — ABNORMAL HIGH (ref 11.5–15.5)
WBC: 22.1 10*3/uL — ABNORMAL HIGH (ref 4.0–10.5)
WBC: 19.9 10*3/uL — ABNORMAL HIGH (ref 4.0–10.5)
nRBC: 0.3 % — ABNORMAL HIGH (ref 0.0–0.2)
nRBC: 0.6 % — ABNORMAL HIGH (ref 0.0–0.2)

## 2019-11-05 LAB — CBC WITH DIFFERENTIAL/PLATELET
Basophils Absolute: 0.4 10*3/uL — ABNORMAL HIGH (ref 0.0–0.2)
Basos: 2 %
EOS (ABSOLUTE): 0.2 10*3/uL (ref 0.0–0.4)
Eos: 1 %
Hematocrit: 36.8 % (ref 34.0–46.6)
Hemoglobin: 12.2 g/dL (ref 11.1–15.9)
Lymphocytes Absolute: 2.5 10*3/uL (ref 0.7–3.1)
Lymphs: 14 %
MCH: 35.4 pg — ABNORMAL HIGH (ref 26.6–33.0)
MCHC: 33.2 g/dL (ref 31.5–35.7)
MCV: 107 fL — ABNORMAL HIGH (ref 79–97)
Monocytes Absolute: 1.1 10*3/uL — ABNORMAL HIGH (ref 0.1–0.9)
Monocytes: 6 %
NRBC: 1 % — ABNORMAL HIGH (ref 0–0)
Neutrophils Absolute: 12.8 10*3/uL — ABNORMAL HIGH (ref 1.4–7.0)
Neutrophils: 73 %
Platelets: 158 10*3/uL (ref 150–450)
RBC: 3.45 x10E6/uL — ABNORMAL LOW (ref 3.77–5.28)
RDW: 15 % (ref 11.7–15.4)
WBC: 17.5 10*3/uL — ABNORMAL HIGH (ref 3.4–10.8)

## 2019-11-05 LAB — PREPARE PLATELET PHERESIS: Unit division: 0

## 2019-11-05 LAB — LIPID PANEL
Chol/HDL Ratio: 3.2 ratio (ref 0.0–4.4)
Cholesterol, Total: 139 mg/dL (ref 100–199)
HDL: 43 mg/dL (ref >39)
LDL Chol Calc (NIH): 74 mg/dL (ref 0–99)
Triglycerides: 120 mg/dL (ref 0–149)
VLDL Cholesterol Cal: 22 mg/dL (ref 5–40)

## 2019-11-05 LAB — BPAM FFP
Blood Product Expiration Date: 202103222359
Blood Product Expiration Date: 202103222359
ISSUE DATE / TIME: 202103181244
ISSUE DATE / TIME: 202103181244
Unit Type and Rh: 6200
Unit Type and Rh: 6200

## 2019-11-05 LAB — GLUCOSE, CAPILLARY
Glucose-Capillary: 104 mg/dL — ABNORMAL HIGH (ref 70–99)
Glucose-Capillary: 109 mg/dL — ABNORMAL HIGH (ref 70–99)
Glucose-Capillary: 109 mg/dL — ABNORMAL HIGH (ref 70–99)
Glucose-Capillary: 125 mg/dL — ABNORMAL HIGH (ref 70–99)
Glucose-Capillary: 126 mg/dL — ABNORMAL HIGH (ref 70–99)
Glucose-Capillary: 127 mg/dL — ABNORMAL HIGH (ref 70–99)
Glucose-Capillary: 99 mg/dL (ref 70–99)

## 2019-11-05 LAB — MAGNESIUM
Magnesium: 2.7 mg/dL — ABNORMAL HIGH (ref 1.7–2.4)
Magnesium: 2.4 mg/dL (ref 1.7–2.4)

## 2019-11-05 LAB — IMMATURE CELLS: Metamyelocytes: 4 % — ABNORMAL HIGH (ref 0–0)

## 2019-11-05 LAB — HEPATIC FUNCTION PANEL
ALT: 22 IU/L (ref 0–32)
AST: 26 IU/L (ref 0–40)
Albumin: 4.5 g/dL (ref 3.7–4.7)
Alkaline Phosphatase: 73 IU/L (ref 39–117)
Bilirubin Total: 0.5 mg/dL (ref 0.0–1.2)
Bilirubin, Direct: 0.14 mg/dL (ref 0.00–0.40)
Total Protein: 7.4 g/dL (ref 6.0–8.5)

## 2019-11-05 LAB — BPAM PLATELET PHERESIS
Blood Product Expiration Date: 202103202359
ISSUE DATE / TIME: 202103181246
Unit Type and Rh: 5100

## 2019-11-05 LAB — PREPARE FRESH FROZEN PLASMA: Unit division: 0

## 2019-11-05 LAB — VITAMIN D 1,25 DIHYDROXY
Vitamin D 1, 25 (OH)2 Total: 57 pg/mL
Vitamin D2 1, 25 (OH)2: 20 pg/mL
Vitamin D3 1, 25 (OH)2: 37 pg/mL

## 2019-11-05 LAB — TSH: TSH: 2.43 u[IU]/mL (ref 0.450–4.500)

## 2019-11-05 LAB — HEMOGLOBIN A1C
Est. average glucose Bld gHb Est-mCnc: 103 mg/dL
Hgb A1c MFr Bld: 5.2 % (ref 4.8–5.6)

## 2019-11-05 LAB — PREPARE RBC (CROSSMATCH)

## 2019-11-05 IMAGING — DX DG CHEST 1V PORT
1 series · 1 of 1 positions shown · non-contrast
Comparison: Radiograph [DATE]

CLINICAL DATA: Chest tube present  S/p CABG

EXAM:
PORTABLE CHEST 1 VIEW

[chest]
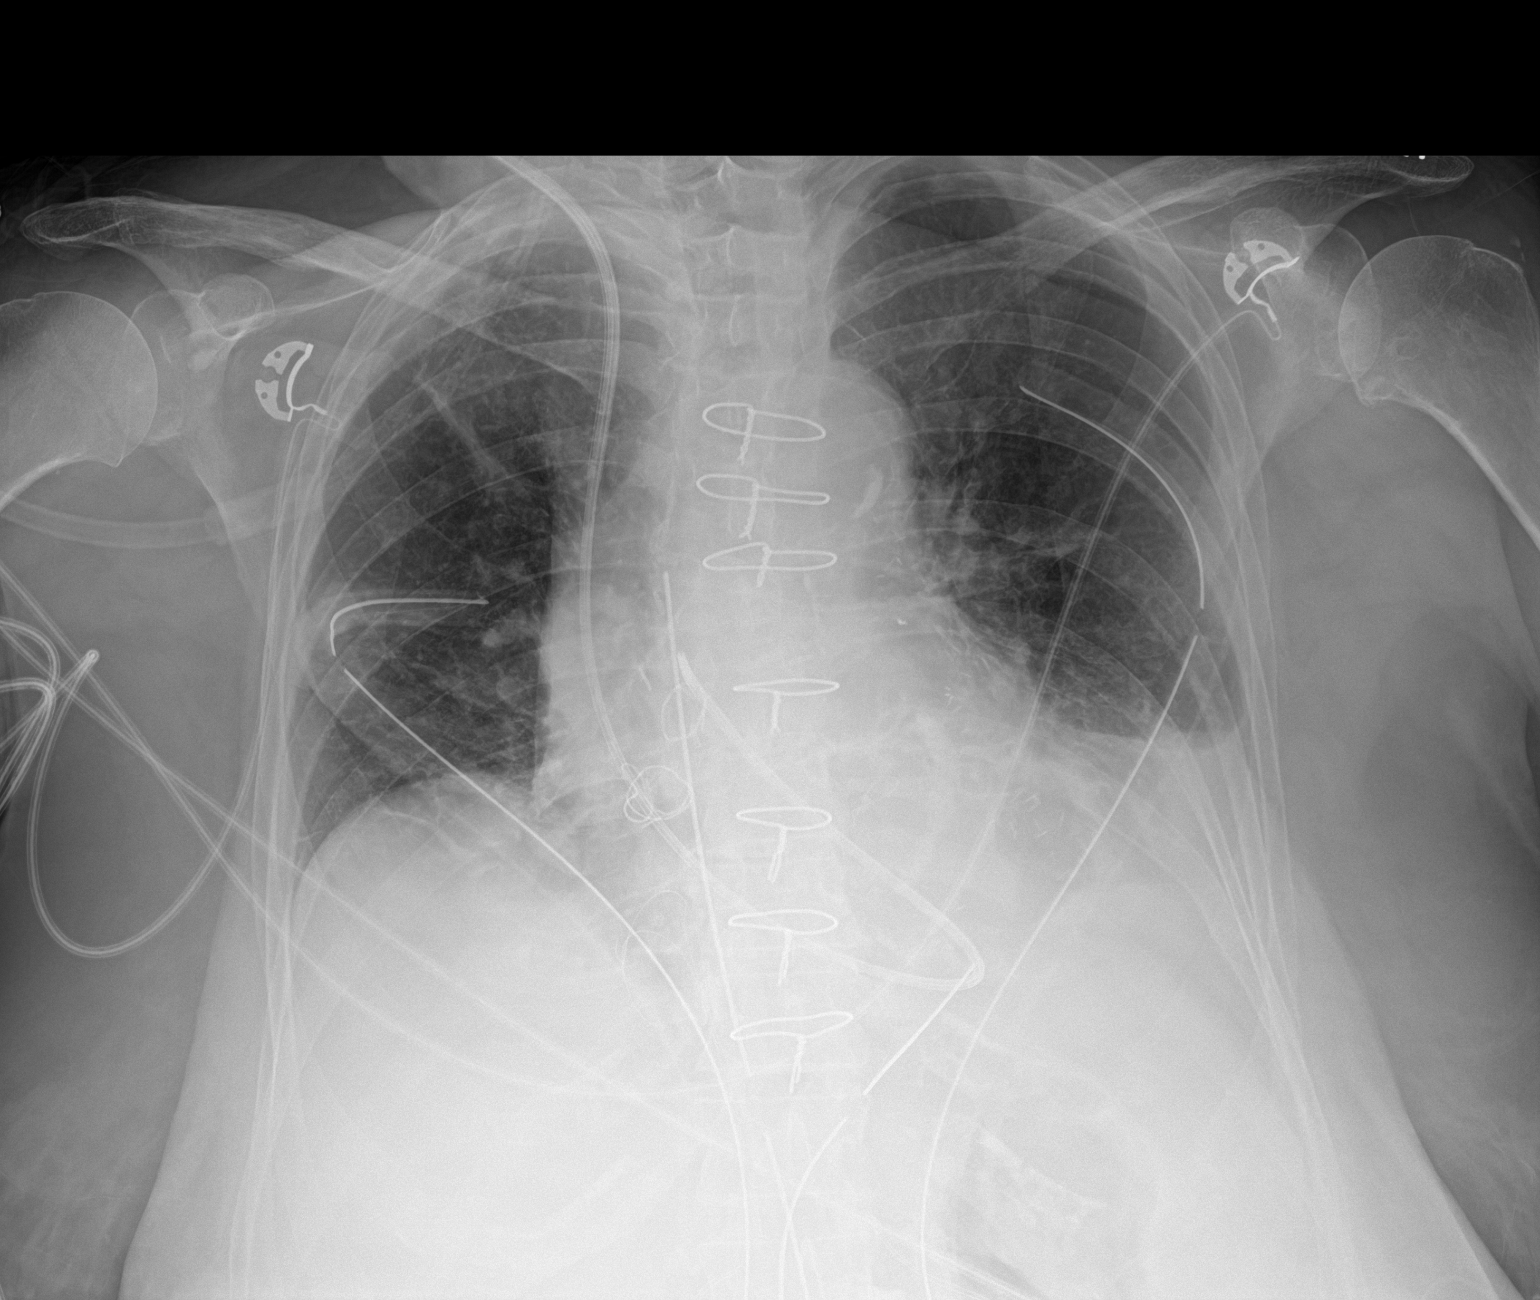

[1 of 1 positions shown; findings below may reference images not displayed]

FINDINGS: Interval extubation and removal of NG tube. No increase in
atelectasis. Persistent LEFT basilar atelectasis and effusion.

Bilateral chest tubes in place.  No appreciable pneumothorax.

Mediastinal drains and Swan-Ganz catheter unchanged
IMPRESSION: 1. Extubation without complication.
2. Persistent LEFT basilar atelectasis and effusion.
3. Bilateral chest tubes in place without appreciable pneumothorax.

## 2019-11-05 MED ORDER — INSULIN ASPART 100 UNIT/ML ~~LOC~~ SOLN
0.0000 [IU] | SUBCUTANEOUS | Status: DC
  Administered 2019-11-05: 2 [IU] via SUBCUTANEOUS

## 2019-11-05 MED ORDER — FE FUMARATE-B12-VIT C-FA-IFC PO CAPS
1.0000 | ORAL_CAPSULE | Freq: Two times a day (BID) | ORAL | Status: DC
  Administered 2019-11-05 – 2019-11-09 (×8): 1 via ORAL
  Filled 2019-11-05 (×9): qty 1

## 2019-11-05 MED ORDER — LIDOCAINE 5 % EX PTCH
1.0000 | MEDICATED_PATCH | CUTANEOUS | Status: DC
  Administered 2019-11-06: 1 via TRANSDERMAL
  Filled 2019-11-05 (×4): qty 1

## 2019-11-05 MED ORDER — TRAMADOL HCL 50 MG PO TABS
50.0000 mg | ORAL_TABLET | ORAL | Status: DC | PRN
  Administered 2019-11-05: 50 mg via ORAL
  Filled 2019-11-05: qty 1

## 2019-11-05 MED ORDER — METOLAZONE 2.5 MG PO TABS
2.5000 mg | ORAL_TABLET | Freq: Every day | ORAL | Status: AC
  Administered 2019-11-05 – 2019-11-06 (×2): 2.5 mg via ORAL
  Filled 2019-11-05 (×2): qty 1

## 2019-11-05 MED ORDER — FUROSEMIDE 10 MG/ML IJ SOLN
40.0000 mg | Freq: Two times a day (BID) | INTRAMUSCULAR | Status: AC
  Administered 2019-11-05 (×2): 40 mg via INTRAVENOUS
  Filled 2019-11-05 (×2): qty 4

## 2019-11-05 MED ORDER — INSULIN ASPART 100 UNIT/ML ~~LOC~~ SOLN
0.0000 [IU] | SUBCUTANEOUS | Status: DC
  Administered 2019-11-05 – 2019-11-08 (×3): 2 [IU] via SUBCUTANEOUS

## 2019-11-05 MED ORDER — SODIUM CHLORIDE 0.9% IV SOLUTION: Freq: Once | INTRAVENOUS | Status: DC

## 2019-11-05 MED FILL — Thrombin (Recombinant) For Soln 20000 Unit: CUTANEOUS | Qty: 1 | Status: AC

## 2019-11-05 NOTE — Progress Notes (Signed)
1 Day Post-Op Procedure(s) (LRB): CORONARY ARTERY BYPASS GRAFTING (CABG) x 4, with ENDOSCOPIC HARVESTING OF RIGHT GREATER SAPHENOUS VEIN. (N/A) TRANSESOPHAGEAL ECHOCARDIOGRAM (TEE) (N/A) Subjective: No complaints  Objective: Vital signs in last 24 hours: Temp:  [96.8 F (36 C)-98.6 F (37 C)] 98.2 F (36.8 C) (03/19 0700) Pulse Rate:  [69-95] 69 (03/19 0700) Cardiac Rhythm: Atrial paced (03/19 0400) Resp:  [12-28] 16 (03/19 0700) BP: (82-124)/(53-84) 95/82 (03/19 0700) SpO2:  [90 %-100 %] 100 % (03/19 0700) Arterial Line BP: (91-152)/(48-78) 116/54 (03/19 0700) FiO2 (%):  [40 %-50 %] 40 % (03/18 2017) Weight:  [77.4 kg] 77.4 kg (03/19 0400)  Hemodynamic parameters for last 24 hours: PAP: (23-40)/(10-22) 32/15 CO:  [3.4 L/min-4.5 L/min] 3.9 L/min CI:  [1.9 L/min/m2-2.4 L/min/m2] 2.1 L/min/m2  Intake/Output from previous day: 03/18 0701 - 03/19 0700 In: 5856.7 [I.V.:3054.7; Blood:1562; IV Piggyback:1240] Out: [id number] [Urine:2965; Emesis/NG output:100; Blood:588; Chest Tube:1120] Intake/Output this shift: No intake/output data recorded.  General appearance: alert and cooperative Neurologic: intact Heart: regular rate and rhythm, S1, S2 normal, no murmur, click, rub or gallop Lungs: clear to auscultation bilaterally Extremities: edema moderate Wound: chest dressing soaked with dried blood.   Lab Results: Recent Labs    11/04/19 2209 11/04/19 2209 11/04/19 2216 11/05/19 0350  WBC 20.5*  --   --  22.1*  HGB 7.3*   < > 6.5* 7.2*  HCT 22.4*   < > 19.0* 22.3*  PLT 93*  --   --  101*   < > = values in this interval not displayed.   BMET:  Recent Labs    11/04/19 2209 11/04/19 2209 11/04/19 2216 11/05/19 0350  NA 141   < > 145 144  K 4.2   < > 3.8 4.5  CL 109  --   --  111  CO2 23  --   --  23  GLUCOSE 166*  --   --  134*  BUN 13  --   --  14  CREATININE 1.04*  --   --  1.09*  CALCIUM 7.6*  --   --  7.9*   < > = values in this interval not displayed.    PT/INR:   Recent Labs    11/04/19 1346  LABPROT 17.7*  INR 1.5*   ABG    Component Value Date/Time   PHART 7.373 11/04/2019 2216   HCO3 22.5 11/04/2019 2216   TCO2 24 11/04/2019 2216   ACIDBASEDEF 3.0 (H) 11/04/2019 2216   O2SAT 83.0 11/04/2019 2216   CBG (last 3)  Recent Labs    11/05/19 0013 11/05/19 0112 11/05/19 0355  GLUCAP 99 109* 127*   CXR: bibasilar atelectasis  ECG: sinus 67, no acute changes  Assessment/Plan: S/P Procedure(s) (LRB): CORONARY ARTERY BYPASS GRAFTING (CABG) x 4, with ENDOSCOPIC HARVESTING OF RIGHT GREATER SAPHENOUS VEIN. (N/A) TRANSESOPHAGEAL ECHOCARDIOGRAM (TEE) (N/A)  Hemodynamically stable with low normal BP ( MAP 70) in sinus rhythm. Rate 60's this am so will continue pacing 80 and hold Lopressor. LVEF normal.  Acute postop blood loss anemia: preop Hgb 11.2 but required 2 units PRBC's in the OR  on pump. Intraop coagulopathy requiring FFP and pts post-pump. Hgb 7.3 last pm and received another unit of PRBC's but Hgb this am unchanged at 7.2. No visible bleeding. Chest tube output fairly low and thin serosanguinous. I suspect this is hemodilution. Given her age, cardiac disease and low normal BP I think it would be best to transfuse her further. This will speed  her progress and decrease risk of atrial fib postop.   Volume excess: start diuresis.  Glucose under good control and no hx of DM. Will continue CBG's and SSI today.  Keep chest tubes in this am and dangle again. Will reassess later.     LOS: 2 days    Yolanda Flowers 11/05/2019

## 2019-11-05 NOTE — Discharge Instructions (Signed)
Discharge Instructions: ° °1. You may shower, please wash incisions daily with soap and water and keep dry.  If you wish to cover wounds with dressing you may do so but please keep clean and change daily.  No tub baths or swimming until incisions have completely healed.  If your incisions become red or develop any drainage please call our office at [phone] ° °2. No Driving until cleared by our office and you are no longer using narcotic pain medications ° °3. Monitor your weight daily.. Please use the same scale and weigh at same time... If you gain 3-5 lbs in 48 hours with associated lower extremity swelling, please contact our office at [phone] ° °4. Fever of 101.5 for at least 24 hours with no source, please contact our office at [phone] ° °5. Activity- up as tolerated, please walk at least 3 times per day.  Avoid strenuous activity, no lifting, pushing, or pulling with your arms over 8-10 lbs for a minimum of 6 weeks ° °6. If any questions or concerns arise, please do not hesitate to contact our office at [phone] °

## 2019-11-05 NOTE — Addendum Note (Signed)
Addendum  created 11/05/19 1049 by Josephine Igo, CRNA   Order list changed

## 2019-11-05 NOTE — Social Work (Signed)
CSW met bedside with patient. CSW inquired on patient's support system once she returns home. Patient reported she has a niece who will be 24hr support once she is stable for discharge. Patient stated her niece will also be the person picking her up once she is medically discharged. No further questions reported at this time.   Criss Alvine, CSW

## 2019-11-05 NOTE — Progress Notes (Signed)
EVENING ROUNDS NOTE :     [address]       [place] [address]             [phone]                 1 Day Post-Op Procedure(s) (LRB): CORONARY ARTERY BYPASS GRAFTING (CABG) x 4, with ENDOSCOPIC HARVESTING OF RIGHT GREATER SAPHENOUS VEIN. (N/A) TRANSESOPHAGEAL ECHOCARDIOGRAM (TEE) (N/A)   Total Length of Stay:  LOS: 2 days  Events:  Complains of chest wall pain Adding lidocaine patch     BP 117/67   Pulse 80   Temp 98.4 F (36.9 C)   Resp 14   Ht 5\' 5"  (1.651 m)   Wt 77.4 kg   SpO2 99%   BMI 28.39 kg/m   PAP: (23-36)/(10-22) 36/19 CO:  [3.6 L/min-4.5 L/min] 3.9 L/min CI:  [2 L/min/m2-2.4 L/min/m2] 2.1 L/min/m2  Vent Mode: CPAP;PSV FiO2 (%):  [40 %] 40 % Set Rate:  [4 bmp] 4 bmp Vt Set:  [450 mL] 450 mL PEEP:  [5 cmH20] 5 cmH20 Pressure Support:  [5 cmH20-10 cmH20] 5 cmH20 Plateau Pressure:  [17 cmH20] 17 cmH20  . sodium chloride Stopped (11/04/19 2041)  . sodium chloride    . sodium chloride 20 mL/hr at 11/04/19 1325  . lactated ringers    . lactated ringers Stopped (11/05/19 1326)  . nitroGLYCERIN Stopped (11/04/19 1651)  . phenylephrine (NEO-SYNEPHRINE) Adult infusion Stopped (11/05/19 0404)    I/O last 3 completed shifts: In: 5856.7 [I.V.:3054.7; Blood:1562; IV Piggyback:1240] Out: [id number] [Urine:2965; Emesis/NG output:100; Blood:588; Chest Tube:1120]   CBC Latest Ref Rng & Units 11/05/2019 11/04/2019 11/04/2019  WBC 4.0 - 10.5 K/uL 22.1(H) - 20.5(H)  Hemoglobin 12.0 - 15.0 g/dL 7.2(L) 6.5(LL) 7.3(L)  Hematocrit 36.0 - 46.0 % 22.3(L) 19.0(L) 22.4(L)  Platelets 150 - 400 K/uL 101(L) - 93(L)    BMP Latest Ref Rng & Units 11/05/2019 11/04/2019 11/04/2019  Glucose 70 - 99 mg/dL 134(H) - 166(H)  BUN 8 - 23 mg/dL 14 - 13  Creatinine 0.44 - 1.00 mg/dL 1.09(H) - 1.04(H)  BUN/Creat Ratio 12 - 28 - - -  Sodium 135 - 145 mmol/L 144 145 141  Potassium 3.5 - 5.1 mmol/L 4.5 3.8 4.2  Chloride 98 - 111 mmol/L 111 - 109  CO2 22 - 32 mmol/L 23 - 23   Calcium 8.9 - 10.3 mg/dL 7.9(L) - 7.6(L)    ABG    Component Value Date/Time   PHART 7.373 11/04/2019 2216   PCO2ART 38.5 11/04/2019 2216   PO2ART 47.0 (L) 11/04/2019 2216   HCO3 22.5 11/04/2019 2216   TCO2 24 11/04/2019 2216   ACIDBASEDEF 3.0 (H) 11/04/2019 2216   O2SAT 83.0 11/04/2019 2216       Melodie Bouillon, MD 11/05/2019 5:42 PM

## 2019-11-05 NOTE — Discharge Summary (Signed)
[address]       [place] [address]             [phone]      Physician Discharge Summary  [name] MRN: [id number] DOB/AGE: 1941/04/05 79 y.o.  Admit date: 11/03/2019 Discharge date: 11/10/2019  Admission Diagnoses:  Patient Active Problem List   Diagnosis Date Noted  . CAD (coronary artery disease) 10/25/2019  . Accelerating angina ([place]) 08/26/2019  . Vitamin D deficiency 08/24/2019  . Combined hyperlipidemia 08/24/2019  . Body mass index 29.0-29.9, adult 08/24/2019  . Vitamin B 12 deficiency 08/24/2019  . Malignant melanoma of great toe ([place]) 08/24/2019  . Pedal edema 08/24/2019  . Skin cancer 08/24/2019  . Squamous cell carcinoma, scalp/neck 08/24/2019  . Ischial bursitis, right 08/24/2019  . Swelling of limb 08/24/2019  . Renal insufficiency 08/24/2019  . Atherosclerosis of arteries 08/24/2019  . Dyspnea 02/09/2019  . Solitary pulmonary nodule on lung CT 02/09/2019  . Gastrointestinal bleeding 06/23/2018  . VIN III (vulvar intraepithelial neoplasia III) 10/09/2015  . Melanoma in situ ([place]) 06/29/2014  . Essential thrombocytosis ([place]) 06/22/2014  . COPD (chronic obstructive pulmonary disease) ([place]) 06/21/2014  . HTN (hypertension) 06/21/2014  . Allergic rhinitis 06/21/2014  . GERD (gastroesophageal reflux disease) 06/21/2014    Discharge Diagnoses:  Principal Problem:   Accelerating angina ([place]) Active Problems:   S/P CABG x 4   Discharged Condition: good  HPI:   Yolanda Flowers is a 79 year old female patient with past medical history significant for essential hypertension, COPD, GERD, hx of subdural hematoma in 2008, squamous cell carcinoma, melanoma in situ, and a vulvar intraepithelial neoplasia III in 2017, who had been seen by Dr. Geraldo Pitter and underwent CT coronary angiography which showed Multivessel coronary artery disease.  The patient was complaining of chest tightness and shortness of breath. She was having  trouble just even walking to her mailbox.  She was scheduled for cardiac catheterization.  Today, she underwent cardiac catheterization which showed distal left main to ostial LAD lesion which is 60% stenosed with 80% stenosis of the side branch in the ostial circumflex.  There is a mid LAD lesion of 50% stenosis, first and second diagonal lesions are both 100% stenosed, there is a 30% stenosis in the distal circumflex, and there is some mild stenosis in the RCA.  Due to the patient's multivessel coronary artery disease, a cardiothoracic consult was recommended.  The patient had a bad experience with anesthesia. She was put to sleep for her first brain surgery (subdural hematoma) in June of 2008 at [facility] they had trouble waking her and "almost lost her". She is terrified of being put to sleep and has been putting off back surgery for years due to this intolerance. Her activity level is restricted due to shortness of breath and chronic back pain.    Hospital Course:   Yolanda Flowers underwent a coronary bypass grafting x4 on November 04, 2019 with Dr. Cyndia Bent.  She tolerated the procedure well and was transferred to the surgical ICU in stable condition.  She is extubated time manner.  Postop day 1 she had no complaints.  She remained hemodynamically stable and in normal sinus rhythm.  She did have some expected acute blood loss anemia.  She also had some expected acute fluid overload.  We did end up transfusing 1 unit of packed red blood cells.  We initiated a diuretic regimen. She continued to progress. We continued a diuretic regimen.  On POD 3 she was stable to transfer to the stepdown unit for continued care. POD 4 we discontinued her epicardial pacing wires since her rhythm was stable. She was having some diarrhea, therefore we stopped all laxatives and stool softeners. We checked a UA for an increasing WBC. We continued to encourage incentive spirometry hourly. Her UA was negative and her diarrhea resolved.  Today, the patient is tolerating room air, her incisions are healing well, she is ambulating with limited assistance, and she is ready for discharge home.   Consults: None  Significant Diagnostic Studies:   CLINICAL DATA:  Hypoxia  EXAM: PORTABLE CHEST 1 VIEW  COMPARISON:  November 03, 2019  FINDINGS: Patient is status post coronary artery bypass grafting. Endotracheal tube tip is 2.9 cm above the carina. Nasogastric tube tip and side port are below the diaphragm. Swan-Ganz catheter tip is in the proximal right main pulmonary artery. There is a chest tube on each side as well as a mediastinal drain. There is a minimal left apical pneumothorax. There is atelectatic change in the right upper lobe and right mid lung regions. There is also mild bibasilar atelectasis. There is calcification in the mitral annulus. There is aortic atherosclerosis. No adenopathy. No bone lesions.  IMPRESSION: Tube and catheter positions as described. Small left apical pneumothorax without tension component. Areas of scattered atelectatic change in the right upper lobe, right mid lung, and left base. Postoperative changes. Stable cardiac size. Aortic Atherosclerosis (ICD10-I70.0).   Electronically Signed   By: Lowella Grip III M.D.   On: 11/04/2019 13:42  Treatments:   CARDIOVASCULAR SURGERY OPERATIVE NOTE  11/04/2019  Surgeon:  Gaye Pollack, MD  First Assistant: Jadene Pierini,  PA-C   Preoperative Diagnosis:  Severe left main and multi-vessel coronary artery disease   Postoperative Diagnosis:  Same   Procedure:  1. Median Sternotomy 2. Extracorporeal circulation 3.   Coronary artery bypass grafting x 4   Left internal mammary artery graft to the LAD  SVG to second diagonal  SVG to OM1  SVG to OM2 4.   Endoscopic vein harvest from the right leg   Anesthesia:  General Endotracheal  Discharge Exam: Blood pressure 131/72, pulse 63, temperature 98.3 F  (36.8 C), temperature source Oral, resp. rate (!) 24, height 5\' 5"  (1.651 m), weight 76.3 kg, SpO2 90 %. General appearance: alert, cooperative and no distress Heart: regular rate and rhythm Lungs: clear to auscultation bilaterally Abdomen: benign Extremities: no edema Wound: incis echymotic, but healing well   Disposition: Discharge disposition: 01-Home or Self Care       Discharge Instructions    Discharge patient   Complete by: As directed    Discharge disposition: 01-Home or Self Care   Discharge patient date: 11/10/2019     Allergies as of 11/10/2019      Reactions   Cephalexin Hives   Lactose Intolerance (gi) Diarrhea   Penicillins Rash   Childhood reaction Did it involve swelling of the face/tongue/throat, SOB, or low BP? No Did it involve sudden or severe rash/hives, skin peeling, or any reaction on the inside of your mouth or nose? No Did you need to seek medical attention at a hospital or doctor's office? No When did it last happen?50 years ago If all above answers are "NO", may proceed with cephalosporin use.      Medication List    STOP taking these medications   lisinopril 20 MG tablet Commonly known as: ZESTRIL   nitroGLYCERIN 0.4 MG SL  tablet Commonly known as: NITROSTAT   oxyCODONE-acetaminophen 7.5-325 MG tablet Commonly known as: PERCOCET     TAKE these medications   ammonium lactate 12 % lotion Commonly known as: LAC-HYDRIN Apply 1 application topically daily as needed (skin spots).   aspirin 325 MG EC tablet Take 1 tablet (325 mg total) by mouth daily. What changed:   medication strength  how much to take  when to take this   atorvastatin 40 MG tablet Commonly known as: LIPITOR Take 1 tablet (40 mg total) by mouth daily at 6 PM.   CALCIUM 600 + D PO Take 1 tablet by mouth in the morning and at bedtime.   erythromycin ophthalmic ointment Place 1 application into both eyes daily as needed (pain/ irritation).   ferrous  [id number] C-folic acid capsule Commonly known as: TRINSICON / FOLTRIN Take 1 capsule by mouth 2 (two) times daily after a meal.   fluticasone 50 MCG/ACT nasal spray Commonly known as: FLONASE Place 2 sprays into both nostrils at bedtime.   hydroxyurea 500 MG capsule Commonly known as: HYDREA Take 500 mg by mouth daily.   lansoprazole 30 MG capsule Commonly known as: PREVACID Take 30 mg by mouth every morning.   Magnesium 250 MG Tabs Take 250 mg by mouth at bedtime.   metoprolol tartrate 25 MG tablet Commonly known as: LOPRESSOR Take 1 tablet (25 mg total) by mouth 2 (two) times daily. What changed:   medication strength  how much to take  when to take this  additional instructions   mupirocin ointment 2 % Commonly known as: BACTROBAN Apply 1 application topically daily as needed (after cancer removal procedures).   PreserVision AREDS 2 Caps Take 1 capsule by mouth in the morning and at bedtime.   traMADol 50 MG tablet Commonly known as: ULTRAM Take 1 tablet (50 mg total) by mouth every 6 (six) hours as needed for up to 7 days for moderate pain.   vitamin C 1000 MG tablet Take 1,000 mg by mouth daily.   Vitamin D (Ergocalciferol) 1.25 MG (50000 UNIT) Caps capsule Commonly known as: DRISDOL Take 50,000 Units by mouth every 14 (fourteen) days.      Follow-up Information    [name], MD. Call in 1 day(s).   Specialty: Internal Medicine Contact information: [place] [address] [phone]        Gaye Pollack, MD Follow up.   Specialty: Cardiothoracic Surgery Why: Your routine follow-up appointment is on 4/21 at 10:30am. Please arrive at 10:00am for a chest xray at [facility] which is on the first floor of our building Contact information: [place] [address] [phone]        Revankar, [name], MD Follow up on 11/24/2019.   Specialty: Cardiology Why: Please arrive  15 minutes early for your 10:20am post-hospital cardiology follow-up appointment. Please note this is in the [place] location as there were not any available appts in [place] in the time frame you needed to be seen.  Contact information: [place] [address] [phone]          The patient has been discharged on:   1.Beta Blocker:  Yes [ yes ]                              No   [   ]  If No, reason:  2.Ace Inhibitor/ARB: Yes [   ]                                     No  [ no ]                                     If No, reason:  3.Statin:   Yes [ yes ]                  No  [   ]                  If No, reason:  4.Ecasa:  Yes  [ yes ]                  No   [   ]                  If No, reason:    Signed: Wilder Glade Brooks 11/10/2019, 8:07 AM

## 2019-11-06 ENCOUNTER — Inpatient Hospital Stay (HOSPITAL_COMMUNITY): Payer: PPO

## 2019-11-06 LAB — GLUCOSE, CAPILLARY
Glucose-Capillary: 100 mg/dL — ABNORMAL HIGH (ref 70–99)
Glucose-Capillary: 113 mg/dL — ABNORMAL HIGH (ref 70–99)
Glucose-Capillary: 115 mg/dL — ABNORMAL HIGH (ref 70–99)
Glucose-Capillary: 115 mg/dL — ABNORMAL HIGH (ref 70–99)
Glucose-Capillary: 131 mg/dL — ABNORMAL HIGH (ref 70–99)
Glucose-Capillary: 69 mg/dL — ABNORMAL LOW (ref 70–99)
Glucose-Capillary: 96 mg/dL (ref 70–99)

## 2019-11-06 LAB — TYPE AND SCREEN
ABO/RH(D): O POS
Antibody Screen: NEGATIVE
Unit division: 0
Unit division: 0
Unit division: 0
Unit division: 0
Unit division: 0

## 2019-11-06 LAB — BPAM RBC
Blood Product Expiration Date: 202104212359
Blood Product Expiration Date: 202104212359
Blood Product Expiration Date: 202104222359
Blood Product Expiration Date: 202104222359
Blood Product Expiration Date: 202104222359
ISSUE DATE / TIME: 202103180809
ISSUE DATE / TIME: 202103180809
ISSUE DATE / TIME: 202103181922
ISSUE DATE / TIME: 202103191053
ISSUE DATE / TIME: 202103191409
Unit Type and Rh: 5100
Unit Type and Rh: 5100
Unit Type and Rh: 5100
Unit Type and Rh: 5100
Unit Type and Rh: 5100

## 2019-11-06 LAB — CBC
HCT: 29.1 % — ABNORMAL LOW (ref 36.0–46.0)
HCT: 29.5 % — ABNORMAL LOW (ref 36.0–46.0)
Hemoglobin: 9.5 g/dL — ABNORMAL LOW (ref 12.0–15.0)
Hemoglobin: 9.8 g/dL — ABNORMAL LOW (ref 12.0–15.0)
MCH: 31.6 pg (ref 26.0–34.0)
MCH: 31.6 pg (ref 26.0–34.0)
MCHC: 32.6 g/dL (ref 30.0–36.0)
MCHC: 33.2 g/dL (ref 30.0–36.0)
MCV: 96.7 fL (ref 80.0–100.0)
MCV: 95.2 fL (ref 80.0–100.0)
Platelets: 91 10*3/uL — ABNORMAL LOW (ref 150–400)
Platelets: 97 10*3/uL — ABNORMAL LOW (ref 150–400)
RBC: 3.01 MIL/uL — ABNORMAL LOW (ref 3.87–5.11)
RBC: 3.1 MIL/uL — ABNORMAL LOW (ref 3.87–5.11)
RDW: 21.1 % — ABNORMAL HIGH (ref 11.5–15.5)
RDW: 20.5 % — ABNORMAL HIGH (ref 11.5–15.5)
WBC: 18.9 10*3/uL — ABNORMAL HIGH (ref 4.0–10.5)
WBC: 20.5 10*3/uL — ABNORMAL HIGH (ref 4.0–10.5)
nRBC: 0.4 % — ABNORMAL HIGH (ref 0.0–0.2)
nRBC: 0.5 % — ABNORMAL HIGH (ref 0.0–0.2)

## 2019-11-06 LAB — CREATININE, SERUM
Creatinine, Ser: 1.11 mg/dL — ABNORMAL HIGH (ref 0.44–1.00)
GFR calc Af Amer: 55 mL/min — ABNORMAL LOW (ref >60)
GFR calc non Af Amer: 47 mL/min — ABNORMAL LOW (ref >60)

## 2019-11-06 LAB — BASIC METABOLIC PANEL
Anion gap: 12 (ref 5–15)
BUN: 18 mg/dL (ref 8–23)
CO2: 27 mmol/L (ref 22–32)
Calcium: 8.2 mg/dL — ABNORMAL LOW (ref 8.9–10.3)
Chloride: 100 mmol/L (ref 98–111)
Creatinine, Ser: 1.32 mg/dL — ABNORMAL HIGH (ref 0.44–1.00)
GFR calc Af Amer: 44 mL/min — ABNORMAL LOW (ref >60)
GFR calc non Af Amer: 38 mL/min — ABNORMAL LOW (ref >60)
Glucose, Bld: 115 mg/dL — ABNORMAL HIGH (ref 70–99)
Potassium: 3.4 mmol/L — ABNORMAL LOW (ref 3.5–5.1)
Sodium: 139 mmol/L (ref 135–145)

## 2019-11-06 IMAGING — DX DG CHEST 1V PORT
1 series · 1 of 1 positions shown · non-contrast
Comparison: Chest radiograph [DATE]

CLINICAL DATA: History of CABG.

EXAM:
PORTABLE CHEST 1 VIEW

[chest]
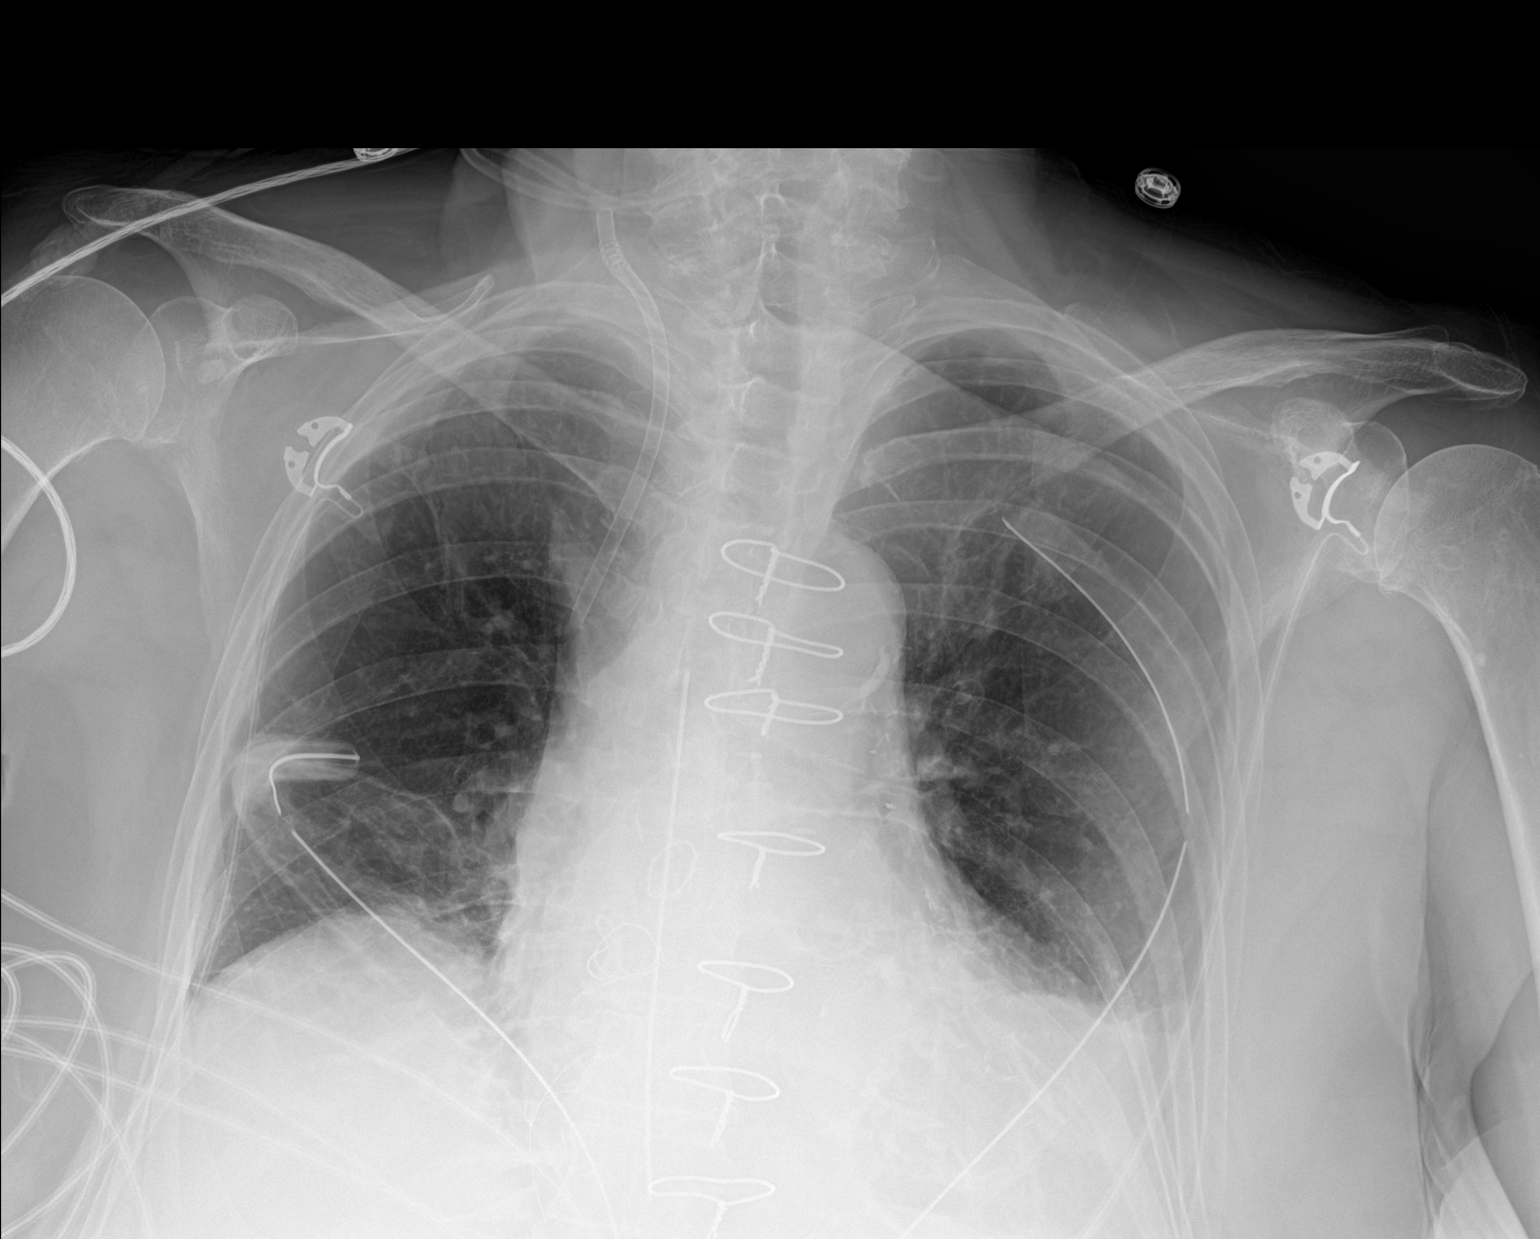

[1 of 1 positions shown; findings below may reference images not displayed]

FINDINGS: Interval removal of a previously demonstrated Swan-Ganz catheter.
The right IJ approach introducer sheath remains in position. Prior
CABG. Unchanged position of multiple chest tubes.

The cardiomediastinal silhouette is unchanged. Aortic
atherosclerosis. Persistent left basilar atelectasis and possible
small effusion. No evidence of pneumothorax
IMPRESSION: Interval removal of a Swan-Ganz catheter. Support apparatus
otherwise unchanged.

Unchanged left basilar atelectasis with possible small left pleural
effusion.

Aortic atherosclerosis.

## 2019-11-06 MED ORDER — POTASSIUM CHLORIDE CRYS ER 20 MEQ PO TBCR
40.0000 meq | EXTENDED_RELEASE_TABLET | Freq: Once | ORAL | Status: AC
  Administered 2019-11-06: 40 meq via ORAL
  Filled 2019-11-06: qty 2

## 2019-11-06 MED ORDER — ENOXAPARIN SODIUM 30 MG/0.3ML ~~LOC~~ SOLN
30.0000 mg | Freq: Every day | SUBCUTANEOUS | Status: DC
  Administered 2019-11-07 – 2019-11-09 (×4): 30 mg via SUBCUTANEOUS
  Filled 2019-11-06 (×5): qty 0.3

## 2019-11-06 NOTE — Progress Notes (Signed)
Patient couldn't ambulate this morning because she nauseas. Zofran given  And patient encouraged to ambulate after breakfast. Patient transferred to chair.

## 2019-11-06 NOTE — Progress Notes (Signed)
      [address]       [place] [address]             [phone]                 2 Days Post-Op Procedure(s) (LRB): CORONARY ARTERY BYPASS GRAFTING (CABG) x 4, with ENDOSCOPIC HARVESTING OF RIGHT GREATER SAPHENOUS VEIN. (N/A) TRANSESOPHAGEAL ECHOCARDIOGRAM (TEE) (N/A)   Events: No events _______________________________________________________________ Vitals: BP (!) 126/50   Pulse 83   Temp 98.7 F (37.1 C) (Oral)   Resp 17   Ht 5\' 5"  (1.651 m)   Wt 80.2 kg   SpO2 97%   BMI 29.42 kg/m   - Neuro: alert NAD  - Cardiovascular: sinus  Drips: none.      - Pulm: EWOB, clear    ABG    Component Value Date/Time   PHART 7.373 11/04/2019 2216   PCO2ART 38.5 11/04/2019 2216   PO2ART 47.0 (L) 11/04/2019 2216   HCO3 22.5 11/04/2019 2216   TCO2 24 11/04/2019 2216   ACIDBASEDEF 3.0 (H) 11/04/2019 2216   O2SAT 83.0 11/04/2019 2216    - Abd: soft - Extremity: warm, trace edema  .Intake/Output      03/19 0701 - 03/20 0700 03/20 0701 - 03/21 0700   I.V. (mL/kg) 131.9 (1.6)    Blood 315    IV Piggyback 0    Total Intake(mL/kg) 446.9 (5.6)    Urine (mL/kg/hr) 3330 (1.7)    Emesis/NG output     Blood     Chest Tube 230    Total Output 3560    Net -3113.1            _______________________________________________________________ Labs: CBC Latest Ref Rng & Units 11/06/2019 11/05/2019 11/05/2019  WBC 4.0 - 10.5 K/uL 18.9(H) 19.9(H) 22.1(H)  Hemoglobin 12.0 - 15.0 g/dL 9.5(L) 9.9(L) 7.2(L)  Hematocrit 36.0 - 46.0 % 29.1(L) 29.6(L) 22.3(L)  Platelets 150 - 400 K/uL 91(L) 84(L) 101(L)   CMP Latest Ref Rng & Units 11/06/2019 11/05/2019 11/05/2019  Glucose 70 - 99 mg/dL 115(H) 113(H) 134(H)  BUN 8 - 23 mg/dL 18 16 14   Creatinine 0.44 - 1.00 mg/dL 1.32(H) 1.27(H) 1.09(H)  Sodium 135 - 145 mmol/L 139 140 144  Potassium 3.5 - 5.1 mmol/L 3.4(L) 3.8 4.5  Chloride 98 - 111 mmol/L 100 105 111  CO2 22 - 32 mmol/L 27 24 23   Calcium 8.9 - 10.3 mg/dL 8.2(L)  8.0(L) 7.9(L)  Total Protein 6.0 - 8.5 g/dL - - -  Total Bilirubin 0.0 - 1.2 mg/dL - - -  Alkaline Phos 39 - 117 IU/L - - -  AST 0 - 40 IU/L - - -  ALT 0 - 32 IU/L - - -    CXR: clear  _______________________________________________________________  Assessment and Plan: POD 2 s/p CABG. Doing well  Neuro: pain controlled CV: on a/s/bb.   Pulm: will remove CT Renal: holding diuresis given bump in creat GI: advancing diet Heme: stable. plts trending up ID: [email or web address].  Wbc trending down Endo: SSI Dispo: will watch in unit given creat.  Melodie Bouillon, MD 11/06/2019 10:13 AM

## 2019-11-07 ENCOUNTER — Inpatient Hospital Stay (HOSPITAL_COMMUNITY): Payer: PPO

## 2019-11-07 LAB — BASIC METABOLIC PANEL
Anion gap: 5 (ref 5–15)
BUN: 10 mg/dL (ref 8–23)
CO2: 19 mmol/L — ABNORMAL LOW (ref 22–32)
Calcium: 5.2 mg/dL — CL (ref 8.9–10.3)
Chloride: 118 mmol/L — ABNORMAL HIGH (ref 98–111)
Creatinine, Ser: 0.59 mg/dL (ref 0.44–1.00)
GFR calc Af Amer: >60 mL/min (ref >60)
GFR calc non Af Amer: >60 mL/min (ref >60)
Glucose, Bld: 73 mg/dL (ref 70–99)
Potassium: 2.2 mmol/L — CL (ref 3.5–5.1)
Sodium: 142 mmol/L (ref 135–145)

## 2019-11-07 LAB — CBC
HCT: 30.9 % — ABNORMAL LOW (ref 36.0–46.0)
Hemoglobin: 10 g/dL — ABNORMAL LOW (ref 12.0–15.0)
MCH: 31.7 pg (ref 26.0–34.0)
MCHC: 32.4 g/dL (ref 30.0–36.0)
MCV: 98.1 fL (ref 80.0–100.0)
Platelets: 134 10*3/uL — ABNORMAL LOW (ref 150–400)
RBC: 3.15 MIL/uL — ABNORMAL LOW (ref 3.87–5.11)
RDW: 20.1 % — ABNORMAL HIGH (ref 11.5–15.5)
WBC: 23.7 10*3/uL — ABNORMAL HIGH (ref 4.0–10.5)
nRBC: 0.5 % — ABNORMAL HIGH (ref 0.0–0.2)

## 2019-11-07 LAB — GLUCOSE, CAPILLARY
Glucose-Capillary: 108 mg/dL — ABNORMAL HIGH (ref 70–99)
Glucose-Capillary: 116 mg/dL — ABNORMAL HIGH (ref 70–99)
Glucose-Capillary: 111 mg/dL — ABNORMAL HIGH (ref 70–99)
Glucose-Capillary: 98 mg/dL (ref 70–99)
Glucose-Capillary: 106 mg/dL — ABNORMAL HIGH (ref 70–99)

## 2019-11-07 IMAGING — DX DG CHEST 1V PORT
1 series · 1 of 1 positions shown · non-contrast
Comparison: Chest radiograph [DATE]

CLINICAL DATA: Pleural effusion.

EXAM:
PORTABLE CHEST 1 VIEW

[chest]
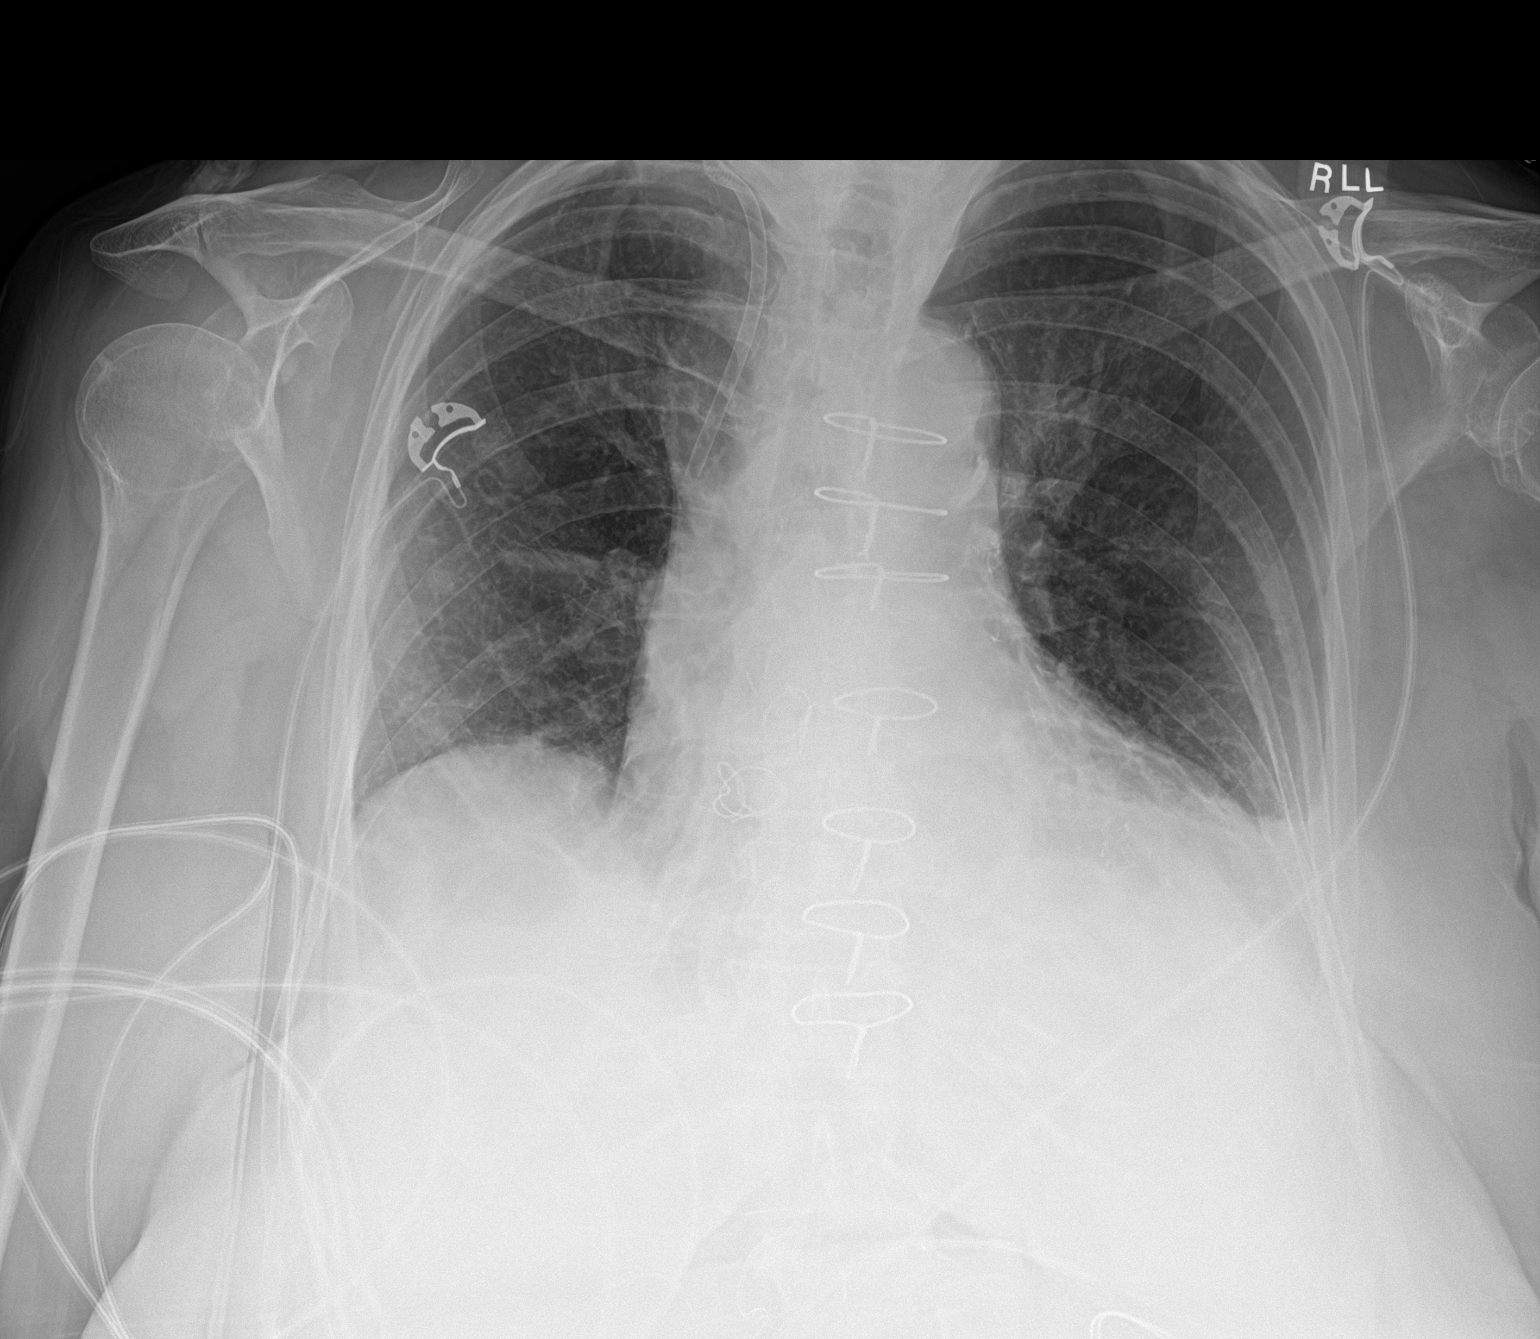

[1 of 1 positions shown; findings below may reference images not displayed]

FINDINGS: A right IJ approach introducer sheath remains in position. Multiple
previously demonstrated chest tubes have been removed. No evidence
of pneumothorax. Prior median sternotomy/CABG. The cardiomediastinal
silhouette is unchanged. Aortic atherosclerosis. Unchanged
appearance of left basilar atelectasis with possible trace left
pleural effusion. The lungs are otherwise clear.
IMPRESSION: Multiple previously demonstrated chest tubes have been removed. No
evidence of pneumothorax

Unchanged position of a right IJ approach introducer sheath.

Similar appearance of left basilar atelectasis with possible trace
left pleural effusion.

## 2019-11-07 MED ORDER — FUROSEMIDE 40 MG PO TABS
40.0000 mg | ORAL_TABLET | Freq: Every day | ORAL | Status: DC
  Administered 2019-11-07 – 2019-11-08 (×2): 40 mg via ORAL
  Filled 2019-11-07: qty 1

## 2019-11-07 MED ORDER — SODIUM CHLORIDE 0.9 % IV SOLN: 250.0000 mL | INTRAVENOUS | Status: DC | PRN

## 2019-11-07 MED ORDER — POTASSIUM CHLORIDE 10 MEQ/50ML IV SOLN
10.0000 meq | INTRAVENOUS | Status: AC
  Administered 2019-11-07 (×3): 10 meq via INTRAVENOUS
  Filled 2019-11-07 (×3): qty 50

## 2019-11-07 MED ORDER — POTASSIUM CHLORIDE CRYS ER 20 MEQ PO TBCR
40.0000 meq | EXTENDED_RELEASE_TABLET | Freq: Every day | ORAL | Status: DC
  Administered 2019-11-07 – 2019-11-08 (×2): 40 meq via ORAL
  Filled 2019-11-07: qty 2

## 2019-11-07 MED ORDER — SODIUM CHLORIDE 0.9% FLUSH
3.0000 mL | Freq: Two times a day (BID) | INTRAVENOUS | Status: DC
  Administered 2019-11-07 – 2019-11-08 (×3): 3 mL via INTRAVENOUS

## 2019-11-07 MED ORDER — CALCIUM GLUCONATE-NACL 2-0.675 GM/100ML-% IV SOLN
2.0000 g | Freq: Once | INTRAVENOUS | Status: AC
  Administered 2019-11-07: 2000 mg via INTRAVENOUS
  Filled 2019-11-07: qty 100

## 2019-11-07 MED ORDER — SODIUM CHLORIDE 0.9% FLUSH: 3.0000 mL | INTRAVENOUS | Status: DC | PRN

## 2019-11-07 MED ORDER — METOPROLOL TARTRATE 25 MG PO TABS
25.0000 mg | ORAL_TABLET | Freq: Two times a day (BID) | ORAL | Status: DC
  Administered 2019-11-07 – 2019-11-08 (×4): 25 mg via ORAL
  Filled 2019-11-07 (×3): qty 1

## 2019-11-07 MED ORDER — ~~LOC~~ CARDIAC SURGERY, PATIENT & FAMILY EDUCATION: Freq: Once | Status: AC

## 2019-11-07 NOTE — Progress Notes (Signed)
      [address]       [place] [address]             [phone]                 3 Days Post-Op Procedure(s) (LRB): CORONARY ARTERY BYPASS GRAFTING (CABG) x 4, with ENDOSCOPIC HARVESTING OF RIGHT GREATER SAPHENOUS VEIN. (N/A) TRANSESOPHAGEAL ECHOCARDIOGRAM (TEE) (N/A)   Events: No events _______________________________________________________________ Vitals: BP 131/83   Pulse 87   Temp 98.8 F (37.1 C) (Oral)   Resp (!) 21   Ht 5\' 5"  (1.651 m)   Wt 79.5 kg   SpO2 95%   BMI 29.17 kg/m   - Neuro: alert NAD  - Cardiovascular: sinus  Drips: none.      - Pulm: EWOB, clear    ABG    Component Value Date/Time   PHART 7.373 11/04/2019 2216   PCO2ART 38.5 11/04/2019 2216   PO2ART 47.0 (L) 11/04/2019 2216   HCO3 22.5 11/04/2019 2216   TCO2 24 11/04/2019 2216   ACIDBASEDEF 3.0 (H) 11/04/2019 2216   O2SAT 83.0 11/04/2019 2216    - Abd: soft - Extremity: warm, trace edema  .Intake/Output      03/20 0701 - 03/21 0700 03/21 0701 - 03/22 0700   P.O. 320    I.V. (mL/kg) 0 (0)    Blood     IV Piggyback 32.8 127.9   Total Intake(mL/kg) 352.8 (4.4) 127.9 (1.6)   Urine (mL/kg/hr) 1425 (0.7)    Chest Tube 70    Total Output 1495    Net -1142.2 +127.9        Urine Occurrence  1 x      _______________________________________________________________ Labs: CBC Latest Ref Rng & Units 11/07/2019 11/06/2019 11/06/2019  WBC 4.0 - 10.5 K/uL 23.7(H) 20.5(H) 18.9(H)  Hemoglobin 12.0 - 15.0 g/dL 10.0(L) 9.8(L) 9.5(L)  Hematocrit 36.0 - 46.0 % 30.9(L) 29.5(L) 29.1(L)  Platelets 150 - 400 K/uL 134(L) 97(L) 91(L)   CMP Latest Ref Rng & Units 11/07/2019 11/06/2019 11/06/2019  Glucose 70 - 99 mg/dL 73 - 115(H)  BUN 8 - 23 mg/dL 10 - 18  Creatinine 0.44 - 1.00 mg/dL 0.59 1.11(H) 1.32(H)  Sodium 135 - 145 mmol/L 142 - 139  Potassium 3.5 - 5.1 mmol/L 2.2(LL) - 3.4(L)  Chloride 98 - 111 mmol/L 118(H) - 100  CO2 22 - 32 mmol/L 19(L) - 27  Calcium 8.9 - 10.3 mg/dL  5.2(LL) - 8.2(L)  Total Protein 6.0 - 8.5 g/dL - - -  Total Bilirubin 0.0 - 1.2 mg/dL - - -  Alkaline Phos 39 - 117 IU/L - - -  AST 0 - 40 IU/L - - -  ALT 0 - 32 IU/L - - -    CXR: stable  _______________________________________________________________  Assessment and Plan: POD 3 s/p CABG. Doing well  Neuro: pain controlled CV: on a/s/bb.   Pulm: continue pulm toilet Renal: will diurese today GI: advancing diet Heme: stable. plts trending up ID: afebrile.  Leukocytosis.  Will follow Endo: SSI Dispo: floor today .  Melodie Bouillon, MD 11/07/2019 10:38 AM

## 2019-11-07 NOTE — Progress Notes (Signed)
11/07/2019 1740 Received pt to room 4E-12 from [facility].  Pt is A&O, no C/O voiced.  Tele monitor applied and CCMD notified.  CHG bath given.  Oriented to room, call light and bed.  Call bell in reach. [place]

## 2019-11-08 LAB — BASIC METABOLIC PANEL
Anion gap: 13 (ref 5–15)
BUN: 21 mg/dL (ref 8–23)
CO2: 25 mmol/L (ref 22–32)
Calcium: 8.6 mg/dL — ABNORMAL LOW (ref 8.9–10.3)
Chloride: 101 mmol/L (ref 98–111)
Creatinine, Ser: 1.16 mg/dL — ABNORMAL HIGH (ref 0.44–1.00)
GFR calc Af Amer: 52 mL/min — ABNORMAL LOW (ref >60)
GFR calc non Af Amer: 45 mL/min — ABNORMAL LOW (ref >60)
Glucose, Bld: 109 mg/dL — ABNORMAL HIGH (ref 70–99)
Potassium: 3.8 mmol/L (ref 3.5–5.1)
Sodium: 139 mmol/L (ref 135–145)

## 2019-11-08 LAB — POCT I-STAT, CHEM 8
BUN: 13 mg/dL (ref 8–23)
BUN: 11 mg/dL (ref 8–23)
BUN: 10 mg/dL (ref 8–23)
BUN: 10 mg/dL (ref 8–23)
BUN: 10 mg/dL (ref 8–23)
Calcium, Ion: 1.22 mmol/L (ref 1.15–1.40)
Calcium, Ion: 1.21 mmol/L (ref 1.15–1.40)
Calcium, Ion: 0.96 mmol/L — ABNORMAL LOW (ref 1.15–1.40)
Calcium, Ion: 0.98 mmol/L — ABNORMAL LOW (ref 1.15–1.40)
Calcium, Ion: 0.92 mmol/L — ABNORMAL LOW (ref 1.15–1.40)
Chloride: 108 mmol/L (ref 98–111)
Chloride: 108 mmol/L (ref 98–111)
Chloride: 103 mmol/L (ref 98–111)
Chloride: 105 mmol/L (ref 98–111)
Chloride: 105 mmol/L (ref 98–111)
Creatinine, Ser: 1 mg/dL (ref 0.44–1.00)
Creatinine, Ser: 0.9 mg/dL (ref 0.44–1.00)
Creatinine, Ser: 0.8 mg/dL (ref 0.44–1.00)
Creatinine, Ser: 0.8 mg/dL (ref 0.44–1.00)
Creatinine, Ser: 0.8 mg/dL (ref 0.44–1.00)
Glucose, Bld: 87 mg/dL (ref 70–99)
Glucose, Bld: 89 mg/dL (ref 70–99)
Glucose, Bld: 80 mg/dL (ref 70–99)
Glucose, Bld: 123 mg/dL — ABNORMAL HIGH (ref 70–99)
Glucose, Bld: 118 mg/dL — ABNORMAL HIGH (ref 70–99)
HCT: 30 % — ABNORMAL LOW (ref 36.0–46.0)
HCT: 27 % — ABNORMAL LOW (ref 36.0–46.0)
HCT: 21 % — ABNORMAL LOW (ref 36.0–46.0)
HCT: 26 % — ABNORMAL LOW (ref 36.0–46.0)
HCT: 24 % — ABNORMAL LOW (ref 36.0–46.0)
Hemoglobin: 10.2 g/dL — ABNORMAL LOW (ref 12.0–15.0)
Hemoglobin: 9.2 g/dL — ABNORMAL LOW (ref 12.0–15.0)
Hemoglobin: 7.1 g/dL — ABNORMAL LOW (ref 12.0–15.0)
Hemoglobin: 8.8 g/dL — ABNORMAL LOW (ref 12.0–15.0)
Hemoglobin: 8.2 g/dL — ABNORMAL LOW (ref 12.0–15.0)
Potassium: 4.6 mmol/L (ref 3.5–5.1)
Potassium: 4.5 mmol/L (ref 3.5–5.1)
Potassium: 4.9 mmol/L (ref 3.5–5.1)
Potassium: 5.5 mmol/L — ABNORMAL HIGH (ref 3.5–5.1)
Potassium: 4.7 mmol/L (ref 3.5–5.1)
Sodium: 142 mmol/L (ref 135–145)
Sodium: 141 mmol/L (ref 135–145)
Sodium: 141 mmol/L (ref 135–145)
Sodium: 137 mmol/L (ref 135–145)
Sodium: 141 mmol/L (ref 135–145)
TCO2: 30 mmol/L (ref 22–32)
TCO2: 26 mmol/L (ref 22–32)
TCO2: 25 mmol/L (ref 22–32)
TCO2: 26 mmol/L (ref 22–32)
TCO2: 23 mmol/L (ref 22–32)

## 2019-11-08 LAB — POCT I-STAT 7, (LYTES, BLD GAS, ICA,H+H)
Acid-Base Excess: 3 mmol/L — ABNORMAL HIGH (ref 0.0–2.0)
Acid-Base Excess: 3 mmol/L — ABNORMAL HIGH (ref 0.0–2.0)
Acid-base deficit: 2 mmol/L (ref 0.0–2.0)
Acid-base deficit: 3 mmol/L — ABNORMAL HIGH (ref 0.0–2.0)
Bicarbonate: 27.9 mmol/L (ref 20.0–28.0)
Bicarbonate: 23.9 mmol/L (ref 20.0–28.0)
Bicarbonate: 27.1 mmol/L (ref 20.0–28.0)
Bicarbonate: 22.2 mmol/L (ref 20.0–28.0)
Calcium, Ion: 1.22 mmol/L (ref 1.15–1.40)
Calcium, Ion: 1.01 mmol/L — ABNORMAL LOW (ref 1.15–1.40)
Calcium, Ion: 1.24 mmol/L (ref 1.15–1.40)
Calcium, Ion: 0.94 mmol/L — ABNORMAL LOW (ref 1.15–1.40)
HCT: 32 % — ABNORMAL LOW (ref 36.0–46.0)
HCT: 24 % — ABNORMAL LOW (ref 36.0–46.0)
HCT: 32 % — ABNORMAL LOW (ref 36.0–46.0)
HCT: 26 % — ABNORMAL LOW (ref 36.0–46.0)
Hemoglobin: 10.9 g/dL — ABNORMAL LOW (ref 12.0–15.0)
Hemoglobin: 10.9 g/dL — ABNORMAL LOW (ref 12.0–15.0)
Hemoglobin: 8.2 g/dL — ABNORMAL LOW (ref 12.0–15.0)
Hemoglobin: 8.8 g/dL — ABNORMAL LOW (ref 12.0–15.0)
O2 Saturation: 100 %
O2 Saturation: 100 %
O2 Saturation: 100 %
O2 Saturation: 98 %
Potassium: 4.6 mmol/L (ref 3.5–5.1)
Potassium: 4.4 mmol/L (ref 3.5–5.1)
Potassium: 4.5 mmol/L (ref 3.5–5.1)
Potassium: 4.7 mmol/L (ref 3.5–5.1)
Sodium: 143 mmol/L (ref 135–145)
Sodium: 142 mmol/L (ref 135–145)
Sodium: 142 mmol/L (ref 135–145)
Sodium: 142 mmol/L (ref 135–145)
TCO2: 29 mmol/L (ref 22–32)
TCO2: 25 mmol/L (ref 22–32)
TCO2: 28 mmol/L (ref 22–32)
TCO2: 23 mmol/L (ref 22–32)
pCO2 arterial: 43 mmHg (ref 32.0–48.0)
pCO2 arterial: 37.9 mmHg (ref 32.0–48.0)
pCO2 arterial: 45.2 mmHg (ref 32.0–48.0)
pCO2 arterial: 38.3 mmHg (ref 32.0–48.0)
pH, Arterial: 7.42 (ref 7.350–7.450)
pH, Arterial: 7.331 — ABNORMAL LOW (ref 7.350–7.450)
pH, Arterial: 7.462 — ABNORMAL HIGH (ref 7.350–7.450)
pH, Arterial: 7.371 (ref 7.350–7.450)
pO2, Arterial: 361 mmHg — ABNORMAL HIGH (ref 83.0–108.0)
pO2, Arterial: 198 mmHg — ABNORMAL HIGH (ref 83.0–108.0)
pO2, Arterial: 344 mmHg — ABNORMAL HIGH (ref 83.0–108.0)
pO2, Arterial: 117 mmHg — ABNORMAL HIGH (ref 83.0–108.0)

## 2019-11-08 LAB — CBC
HCT: 29.1 % — ABNORMAL LOW (ref 36.0–46.0)
Hemoglobin: 9.4 g/dL — ABNORMAL LOW (ref 12.0–15.0)
MCH: 31.9 pg (ref 26.0–34.0)
MCHC: 32.3 g/dL (ref 30.0–36.0)
MCV: 98.6 fL (ref 80.0–100.0)
Platelets: 117 10*3/uL — ABNORMAL LOW (ref 150–400)
RBC: 2.95 MIL/uL — ABNORMAL LOW (ref 3.87–5.11)
RDW: 19.7 % — ABNORMAL HIGH (ref 11.5–15.5)
WBC: 25.9 10*3/uL — ABNORMAL HIGH (ref 4.0–10.5)
nRBC: 0.3 % — ABNORMAL HIGH (ref 0.0–0.2)

## 2019-11-08 LAB — GLUCOSE, CAPILLARY
Glucose-Capillary: 111 mg/dL — ABNORMAL HIGH (ref 70–99)
Glucose-Capillary: 125 mg/dL — ABNORMAL HIGH (ref 70–99)
Glucose-Capillary: 147 mg/dL — ABNORMAL HIGH (ref 70–99)
Glucose-Capillary: 86 mg/dL (ref 70–99)
Glucose-Capillary: 88 mg/dL (ref 70–99)
Glucose-Capillary: 93 mg/dL (ref 70–99)

## 2019-11-08 MED ORDER — HYDROXYUREA 500 MG PO CAPS
500.0000 mg | ORAL_CAPSULE | Freq: Every day | ORAL | Status: DC
  Administered 2019-11-08 – 2019-11-10 (×3): 500 mg via ORAL
  Filled 2019-11-08 (×3): qty 1

## 2019-11-08 NOTE — Progress Notes (Addendum)
[address]       [place] [address]             [phone]      4 Days Post-Op Procedure(s) (LRB): CORONARY ARTERY BYPASS GRAFTING (CABG) x 4, with ENDOSCOPIC HARVESTING OF RIGHT GREATER SAPHENOUS VEIN. (N/A) TRANSESOPHAGEAL ECHOCARDIOGRAM (TEE) (N/A) Subjective: + diarrhea  Objective: Vital signs in last 24 hours: Temp:  [98 F (36.7 C)-99.1 F (37.3 C)] 98.1 F (36.7 C) (03/22 0449) Pulse Rate:  [73-163] 79 (03/22 0449) Cardiac Rhythm: Normal sinus rhythm (03/22 0700) Resp:  [16-24] 18 (03/21 2110) BP: (94-131)/(61-84) 118/63 (03/22 0449) SpO2:  [85 %-98 %] 91 % (03/22 0449) Weight:  [77.7 kg] 77.7 kg (03/22 0449)  Hemodynamic parameters for last 24 hours:    Intake/Output from previous day: 03/21 0701 - 03/22 0700 In: 570.9 [P.O.:342; IV Piggyback:228.9] Out: 1 [Stool:1] Intake/Output this shift: No intake/output data recorded.  General appearance: alert, cooperative and no distress Heart: regular rate and rhythm Lungs: clear to auscultation bilaterally Abdomen: benign Extremities: no edema Wound: incis healing well, Right leg echymosis from [facility]  Lab Results: Recent Labs    11/07/19 0550 11/08/19 0346  WBC 23.7* 25.9*  HGB 10.0* 9.4*  HCT 30.9* 29.1*  PLT 134* 117*   BMET:  Recent Labs    11/07/19 0425 11/08/19 0346  NA 142 139  K 2.2* 3.8  CL 118* 101  CO2 19* 25  GLUCOSE 73 109*  BUN 10 21  CREATININE 0.59 1.16*  CALCIUM 5.2* 8.6*    PT/INR: No results for input(s): LABPROT, INR in the last 72 hours. ABG    Component Value Date/Time   PHART 7.373 11/04/2019 2216   HCO3 22.5 11/04/2019 2216   TCO2 24 11/04/2019 2216   ACIDBASEDEF 3.0 (H) 11/04/2019 2216   O2SAT 83.0 11/04/2019 2216   CBG (last 3)  Recent Labs    11/07/19 2112 11/08/19 0023 11/08/19 0452  GLUCAP 106* 125* 111*    Meds Scheduled Meds: . sodium chloride   Intravenous Once  . sodium chloride   Intravenous Once  . acetaminophen  1,000  mg Oral Q6H   Or  . acetaminophen (TYLENOL) oral liquid 160 mg/5 mL  1,000 mg Per Tube Q6H  . aspirin EC  325 mg Oral Daily   Or  . aspirin  324 mg Per Tube Daily  . atorvastatin  40 mg Oral q1800  . bisacodyl  10 mg Oral Daily   Or  . bisacodyl  10 mg Rectal Daily  . Chlorhexidine Gluconate Cloth  6 each Topical Daily  . docusate sodium  200 mg Oral Daily  . enoxaparin (LOVENOX) injection  30 mg Subcutaneous QHS  . ferrous Q000111Q C-folic acid  1 capsule Oral BID PC  . furosemide  40 mg Oral Daily  . insulin aspart  0-24 Units Subcutaneous Q4H  . lidocaine  1 patch Transdermal Q24H  . mouth rinse  15 mL Mouth Rinse BID  . metoprolol tartrate  25 mg Oral BID  . pantoprazole  40 mg Oral Daily  . potassium chloride  40 mEq Oral Daily  . sodium chloride flush  10-40 mL Intracatheter Q12H  . sodium chloride flush  3 mL Intravenous Q12H  . sodium chloride flush  3 mL Intravenous Q12H   Continuous Infusions: . sodium chloride Stopped (11/04/19 2041)  . sodium chloride    . sodium chloride 20 mL/hr at 11/04/19 1325  . sodium chloride    .  lactated ringers    . lactated ringers Stopped (11/05/19 1326)   PRN Meds:.sodium chloride, sodium chloride, morphine injection, ondansetron (ZOFRAN) IV, oxyCODONE, sodium chloride flush, sodium chloride flush, sodium chloride flush, traMADol  Xrays DG Chest Port 1 View  Result Date: 11/07/2019 CLINICAL DATA:  Pleural effusion. EXAM: PORTABLE CHEST 1 VIEW COMPARISON:  Chest radiograph 11/06/2019 FINDINGS: A right IJ approach introducer sheath remains in position. Multiple previously demonstrated chest tubes have been removed. No evidence of pneumothorax. Prior median sternotomy/CABG. The cardiomediastinal silhouette is unchanged. Aortic atherosclerosis. Unchanged appearance of left basilar atelectasis with possible trace left pleural effusion. The lungs are otherwise clear. IMPRESSION: Multiple previously demonstrated chest tubes have  been removed. No evidence of pneumothorax Unchanged position of a right IJ approach introducer sheath. Similar appearance of left basilar atelectasis with possible trace left pleural effusion. Electronically Signed   By: Kellie Simmering DO   On: 11/07/2019 08:58    Assessment/Plan: S/P Procedure(s) (LRB): CORONARY ARTERY BYPASS GRAFTING (CABG) x 4, with ENDOSCOPIC HARVESTING OF RIGHT GREATER SAPHENOUS VEIN. (N/A) TRANSESOPHAGEAL ECHOCARDIOGRAM (TEE) (N/A)  Doing well POD#4 1 hemodyn stable in sinus rhythm 2 sats good - wean off , cont pulm toilet 3 increasing trend leukocytosis, no fevers, will check UA, may need further evaluation. Was on Hydrea preop- will re-order 4 H/H pretty stable ABL anemia 5 BS well controlled 6 renal fxn ok, weight trending down, cont to diurese 7 d/c epw's 8 poss home 1-2 days  LOS: 5 days    John Giovanni PA-C Pager [phone] 11/08/2019   Chart reviewed, patient examined, agree with above. Tired today. Says she did not sleep much due to diarrhea. Stop laxative and stool softener. Wt is almost down to preop. She had baseline leukocytosis. WBC rising postop but I suspect this is inflammatory. Checking UA. Continue IS, ambulation.

## 2019-11-08 NOTE — Progress Notes (Signed)
CARDIAC REHAB PHASE I   PRE:  Rate/Rhythm: 30 SR  BP:  Sitting: 117/67      SaO2: 93 RA  MODE:  Ambulation: 90 ft   POST:  Rate/Rhythm: 86 SR  BP:  Sitting: 122/68    SaO2: 94 RA  Pt ambulated 23ft in hallway assist of one with front wheel walker. Pt states mobility limited due to fatigue and some incisional pain. Pt declines pain meds. Pt helped back into bed. Encouraged ambulation as able. Will continue to follow.  [id number] Rufina Falco, RN BSN 11/08/2019 1:55 PM

## 2019-11-08 NOTE — Progress Notes (Signed)
CARDIAC REHAB PHASE I   Offered to walk with pt. Pt states she was up all night with diarrhea and is afraid it is continuing into today. Will give pt time to rest and f/u to encourage ambulation.  Rufina Falco, RN BSN 11/08/2019 9:35 AM

## 2019-11-09 LAB — URINALYSIS, COMPLETE (UACMP) WITH MICROSCOPIC
Bacteria, UA: NONE SEEN
Bilirubin Urine: NEGATIVE
Glucose, UA: NEGATIVE mg/dL
Hgb urine dipstick: NEGATIVE
Ketones, ur: 5 mg/dL — AB
Leukocytes,Ua: NEGATIVE
Nitrite: NEGATIVE
Protein, ur: NEGATIVE mg/dL
Specific Gravity, Urine: 1.015 (ref 1.005–1.030)
pH: 6 (ref 5.0–8.0)

## 2019-11-09 MED ORDER — METOPROLOL TARTRATE 25 MG PO TABS
25.0000 mg | ORAL_TABLET | Freq: Two times a day (BID) | ORAL | Status: DC
  Administered 2019-11-09 – 2019-11-10 (×3): 25 mg via ORAL
  Filled 2019-11-09 (×3): qty 1

## 2019-11-09 MED ORDER — METOPROLOL TARTRATE 12.5 MG HALF TABLET: 12.5000 mg | ORAL_TABLET | Freq: Two times a day (BID) | ORAL | Status: DC

## 2019-11-09 NOTE — Progress Notes (Signed)
CARDIAC REHAB PHASE I   PRE:  Rate/Rhythm: 82 SR  BP:  Sitting: 139/77      SaO2: 95 RA  MODE:  Ambulation: 160 ft   POST:  Rate/Rhythm: 95 SR  BP:  Sitting: 143/84    SaO2: 97 RA  Helped pt order lunch, then pt ambulated 180ft in hallway assist of one with one short standing rest break. Pt c/o fatigue and SOB. Pt maintained sats throughout the walk. Pt returned to recliner, call bell and bedside table within reach. Encouraged continued IS use and walks. Pt currently demonstrating 825 on IS. Will continue to follow.  [id number] Rufina Falco, RN BSN 11/09/2019 11:25 AM

## 2019-11-09 NOTE — Progress Notes (Signed)
MOBILITY TEAM - Progress Note   11/09/19 1429  Mobility  Activity Ambulated in hall  Level of Assistance Standby assist, set-up cues, supervision of patient - no hands on  Assistive Device Front wheel walker  Distance Ambulated (ft) 160 ft  Mobility Response Tolerated fair  Mobility performed by Mobility specialist  Bed Position Chair   Post-activity: HR 75, SpO2 90% on RA  Daughter-in-law present and asking for education regarding precautions/activity restrictions. Pt moving well without physical assist; intermittent cues for sternal precautions. Declined trying ambulation without walker. 1x standing rest break in hallway; declined further distance secondary to SOB and fatigue.  Mabeline Caras, PT, DPT Mobility Team Pager [phone]

## 2019-11-09 NOTE — Progress Notes (Signed)
5 Days Post-Op Procedure(s) (LRB): CORONARY ARTERY BYPASS GRAFTING (CABG) x 4, with ENDOSCOPIC HARVESTING OF RIGHT GREATER SAPHENOUS VEIN. (N/A) TRANSESOPHAGEAL ECHOCARDIOGRAM (TEE) (N/A) Subjective: Diarrhea resolved. Still feels weak but walked this am.  Objective: Vital signs in last 24 hours: Temp:  [97.5 F (36.4 C)-98.8 F (37.1 C)] 97.5 F (36.4 C) (03/23 0320) Pulse Rate:  [40-83] 40 (03/23 0649) Cardiac Rhythm: Normal sinus rhythm (03/23 0700) Resp:  [17-24] 20 (03/23 0649) BP: (114-138)/(69-76) 131/76 (03/23 0320) SpO2:  [89 %-93 %] 91 % (03/23 0649) Weight:  [76.7 kg] 76.7 kg (03/23 0320)  Hemodynamic parameters for last 24 hours:    Intake/Output from previous day: 03/22 0701 - 03/23 0700 In: 243 [P.O.:240; I.V.:3] Out: 600 [Urine:600] Intake/Output this shift: No intake/output data recorded.  General appearance: alert and cooperative Neurologic: intact Heart: regular rate and rhythm, S1, S2 normal, no murmur Lungs: diminished breath sounds base - left Extremities: no edema, right leg ecchymosis from vein harvest. Wound: incisions ok  Lab Results: Recent Labs    11/07/19 0550 11/08/19 0346  WBC 23.7* 25.9*  HGB 10.0* 9.4*  HCT 30.9* 29.1*  PLT 134* 117*   BMET:  Recent Labs    11/07/19 0425 11/08/19 0346  NA 142 139  K 2.2* 3.8  CL 118* 101  CO2 19* 25  GLUCOSE 73 109*  BUN 10 21  CREATININE 0.59 1.16*  CALCIUM 5.2* 8.6*    PT/INR: No results for input(s): LABPROT, INR in the last 72 hours. ABG    Component Value Date/Time   PHART 7.373 11/04/2019 2216   HCO3 22.5 11/04/2019 2216   TCO2 24 11/04/2019 2216   ACIDBASEDEF 3.0 (H) 11/04/2019 2216   O2SAT 83.0 11/04/2019 2216   CBG (last 3)  Recent Labs    11/08/19 0837 11/08/19 1241 11/08/19 1629  GLUCAP 86 147* 88    Assessment/Plan: S/P Procedure(s) (LRB): CORONARY ARTERY BYPASS GRAFTING (CABG) x 4, with ENDOSCOPIC HARVESTING OF RIGHT GREATER SAPHENOUS VEIN.  (N/A) TRANSESOPHAGEAL ECHOCARDIOGRAM (TEE) (N/A)  POD 5 Hemodynamically stable in sinus rhythm. Continue Lopressor.  Wt is below preop. DC lasix and K+  Continue IS, ambulation.  Leukocytosis: chronic. UA negative.  Plan home tomorrow.   LOS: 6 days    Gaye Pollack 11/09/2019

## 2019-11-10 MED ORDER — TRAMADOL HCL 50 MG PO TABS: 50.0000 mg | ORAL_TABLET | Freq: Four times a day (QID) | ORAL | 0 refills | Status: AC | PRN

## 2019-11-10 MED ORDER — ASPIRIN 325 MG PO TBEC: 325.0000 mg | DELAYED_RELEASE_TABLET | Freq: Every day | ORAL | Status: DC

## 2019-11-10 MED ORDER — ATORVASTATIN CALCIUM 40 MG PO TABS: 40.0000 mg | ORAL_TABLET | Freq: Every day | ORAL | 1 refills | Status: DC

## 2019-11-10 MED ORDER — FE FUMARATE-B12-VIT C-FA-IFC PO CAPS: 1.0000 | ORAL_CAPSULE | Freq: Two times a day (BID) | ORAL | 1 refills | Status: DC

## 2019-11-10 MED ORDER — ACETAMINOPHEN 325 MG PO TABS
325.0000 mg | ORAL_TABLET | Freq: Once | ORAL | Status: AC
  Administered 2019-11-10: 325 mg via ORAL
  Filled 2019-11-10: qty 1

## 2019-11-10 MED ORDER — METOPROLOL TARTRATE 25 MG PO TABS: 25.0000 mg | ORAL_TABLET | Freq: Two times a day (BID) | ORAL | 1 refills | Status: DC

## 2019-11-10 NOTE — Progress Notes (Signed)
CARDIAC REHAB PHASE I   D/c education completed with pt. PT states she has problems with memory, so instructions typed up for her niece who will be staying with her. Reviewed importance of site care and monitoring incision daily. Encouraged continued IS use, walks, and sternal precautions. Pt given in-the-tube sheet along with a heart healthy diet. Reviewed restrictions and exercise guidelines. Will refer to CRP II [place].  [id number] Rufina Falco, RN BSN 11/10/2019 9:25 AM

## 2019-11-10 NOTE — Progress Notes (Addendum)
[address]       [place] [address]             [phone]      6 Days Post-Op Procedure(s) (LRB): CORONARY ARTERY BYPASS GRAFTING (CABG) x 4, with ENDOSCOPIC HARVESTING OF RIGHT GREATER SAPHENOUS VEIN. (N/A) TRANSESOPHAGEAL ECHOCARDIOGRAM (TEE) (N/A) Subjective: C/o ofchest soreness  Objective: Vital signs in last 24 hours: Temp:  [97.7 F (36.5 C)-98.3 F (36.8 C)] 98.3 F (36.8 C) (03/24 0319) Pulse Rate:  [63-82] 63 (03/24 0319) Cardiac Rhythm: Normal sinus rhythm (03/23 1900) Resp:  [18-25] 24 (03/24 0319) BP: (121-142)/(66-78) 131/72 (03/24 0319) SpO2:  [90 %-96 %] 90 % (03/24 0319) FiO2 (%):  [21 %] 21 % (03/23 2100) Weight:  [76.3 kg] 76.3 kg (03/24 0409)  Hemodynamic parameters for last 24 hours:    Intake/Output from previous day: 03/23 0701 - 03/24 0700 In: 160 [P.O.:160] Out: 200 [Urine:200] Intake/Output this shift: No intake/output data recorded.  General appearance: alert, cooperative and no distress Heart: regular rate and rhythm Lungs: clear to auscultation bilaterally Abdomen: benign Extremities: no edema Wound: incis echymotic, but healing well  Lab Results: Recent Labs    11/08/19 0346  WBC 25.9*  HGB 9.4*  HCT 29.1*  PLT 117*   BMET:  Recent Labs    11/08/19 0346  NA 139  K 3.8  CL 101  CO2 25  GLUCOSE 109*  BUN 21  CREATININE 1.16*  CALCIUM 8.6*    PT/INR: No results for input(s): LABPROT, INR in the last 72 hours. ABG    Component Value Date/Time   PHART 7.373 11/04/2019 2216   HCO3 22.5 11/04/2019 2216   TCO2 24 11/04/2019 2216   ACIDBASEDEF 3.0 (H) 11/04/2019 2216   O2SAT 83.0 11/04/2019 2216   CBG (last 3)  Recent Labs    11/08/19 0837 11/08/19 1241 11/08/19 1629  GLUCAP 86 147* 88    Meds Scheduled Meds: . aspirin EC  325 mg Oral Daily   Or  . aspirin  324 mg Per Tube Daily  . atorvastatin  40 mg Oral q1800  . Chlorhexidine Gluconate Cloth  6 each Topical Daily  .  enoxaparin (LOVENOX) injection  30 mg Subcutaneous QHS  . ferrous Q000111Q C-folic acid  1 capsule Oral BID PC  . hydroxyurea  500 mg Oral Daily  . lidocaine  1 patch Transdermal Q24H  . mouth rinse  15 mL Mouth Rinse BID  . metoprolol tartrate  25 mg Oral BID  . pantoprazole  40 mg Oral Daily  . sodium chloride flush  10-40 mL Intracatheter Q12H  . sodium chloride flush  3 mL Intravenous Q12H  . sodium chloride flush  3 mL Intravenous Q12H   Continuous Infusions: . sodium chloride    . sodium chloride     PRN Meds:.sodium chloride, ondansetron (ZOFRAN) IV, oxyCODONE, sodium chloride flush, sodium chloride flush, sodium chloride flush, traMADol  Xrays No results found.  Assessment/Plan: S/P Procedure(s) (LRB): CORONARY ARTERY BYPASS GRAFTING (CABG) x 4, with ENDOSCOPIC HARVESTING OF RIGHT GREATER SAPHENOUS VEIN. (N/A) TRANSESOPHAGEAL ECHOCARDIOGRAM (TEE) (N/A)  1 doing well 2 hemodyn stable in sinus rhythm 3 no new labs 4 BS well controlled 5 UA unremarkable for infection , no fevers 6 appears stable for d/c    LOS: 7 days    Yolanda Giovanni PA-C Pager [phone] 11/10/2019   Chart reviewed, patient examined, agree with above. She is sore this morning but hasn't taken  pain meds in a couple days.  Rhythm has been stable in sinus. BP fine. No fevers Wt below preop. Incisions look good. She is stable for discharge.

## 2019-11-10 NOTE — Progress Notes (Signed)
Discharge instructions given to patient. IV removed, clean and intact. Medications and wound care reviewed. All questions answered. Pt escorted home with son.  Arletta Bale, RN

## 2019-11-10 NOTE — Care Management Important Message (Signed)
Important Message  Patient Details  Name: Yolanda Flowers MRN: [id number] Date of Birth: Feb 16, 1941   Medicare Important Message Given:  Yes     Shelda Altes 11/10/2019, 10:03 AM

## 2019-11-11 DIAGNOSIS — H34232 Retinal artery branch occlusion, left eye: Secondary | ICD-10-CM | POA: Diagnosis not present

## 2019-11-11 DIAGNOSIS — H47293 Other optic atrophy, bilateral: Secondary | ICD-10-CM | POA: Diagnosis not present

## 2019-11-11 DIAGNOSIS — Z961 Presence of intraocular lens: Secondary | ICD-10-CM | POA: Diagnosis not present

## 2019-11-11 DIAGNOSIS — H26491 Other secondary cataract, right eye: Secondary | ICD-10-CM | POA: Diagnosis not present

## 2019-11-11 DIAGNOSIS — E669 Obesity, unspecified: Secondary | ICD-10-CM | POA: Diagnosis not present

## 2019-11-11 DIAGNOSIS — E663 Overweight: Secondary | ICD-10-CM | POA: Diagnosis not present

## 2019-11-11 DIAGNOSIS — Z6829 Body mass index (BMI) 29.0-29.9, adult: Secondary | ICD-10-CM | POA: Diagnosis not present

## 2019-11-11 DIAGNOSIS — H43393 Other vitreous opacities, bilateral: Secondary | ICD-10-CM | POA: Diagnosis not present

## 2019-11-11 DIAGNOSIS — H539 Unspecified visual disturbance: Secondary | ICD-10-CM | POA: Diagnosis not present

## 2019-11-11 DIAGNOSIS — I2510 Atherosclerotic heart disease of native coronary artery without angina pectoris: Secondary | ICD-10-CM | POA: Diagnosis not present

## 2019-11-11 DIAGNOSIS — M7989 Other specified soft tissue disorders: Secondary | ICD-10-CM | POA: Diagnosis not present

## 2019-11-11 DIAGNOSIS — H02119 Cicatricial ectropion of unspecified eye, unspecified eyelid: Secondary | ICD-10-CM | POA: Diagnosis not present

## 2019-11-11 DIAGNOSIS — Z09 Encounter for follow-up examination after completed treatment for conditions other than malignant neoplasm: Secondary | ICD-10-CM | POA: Diagnosis not present

## 2019-11-11 DIAGNOSIS — H01009 Unspecified blepharitis unspecified eye, unspecified eyelid: Secondary | ICD-10-CM | POA: Diagnosis not present

## 2019-11-11 MED FILL — Heparin Sodium (Porcine) Inj 1000 Unit/ML: INTRAMUSCULAR | Qty: 10 | Status: AC

## 2019-11-11 MED FILL — Mannitol IV Soln 20%: INTRAVENOUS | Qty: 500 | Status: AC

## 2019-11-11 MED FILL — Lidocaine HCl Local Soln Prefilled Syringe 100 MG/5ML (2%): INTRAMUSCULAR | Qty: 5 | Status: AC

## 2019-11-11 MED FILL — Sodium Chloride IV Soln 0.9%: INTRAVENOUS | Qty: 2000 | Status: AC

## 2019-11-11 MED FILL — Electrolyte-R (PH 7.4) Solution: INTRAVENOUS | Qty: 6000 | Status: AC

## 2019-11-11 MED FILL — Sodium Bicarbonate IV Soln 8.4%: INTRAVENOUS | Qty: 50 | Status: AC

## 2019-11-15 ENCOUNTER — Ambulatory Visit (INDEPENDENT_AMBULATORY_CARE_PROVIDER_SITE_OTHER): Payer: Self-pay | Admitting: Surgical

## 2019-11-15 ENCOUNTER — Other Ambulatory Visit: Payer: Self-pay

## 2019-11-15 ENCOUNTER — Encounter: Payer: Self-pay | Admitting: Surgical

## 2019-11-15 VITALS — BP 139/89 | HR 68 | Temp 97.9°F | Resp 20 | Ht 66.0 in

## 2019-11-15 DIAGNOSIS — T8149XA Infection following a procedure, other surgical site, initial encounter: Secondary | ICD-10-CM

## 2019-11-15 MED ORDER — CIPROFLOXACIN HCL 250 MG PO TABS: 250.0000 mg | ORAL_TABLET | Freq: Two times a day (BID) | ORAL | 0 refills | Status: DC

## 2019-11-15 MED ORDER — CIPROFLOXACIN HCL 250 MG PO TABS: 500.0000 mg | ORAL_TABLET | Freq: Two times a day (BID) | ORAL | 0 refills | Status: DC

## 2019-11-15 NOTE — Progress Notes (Signed)
[name]             [phone]           [address]       [name]             [name] Date of Birth: 26-Sep-1940  Referring: Revankar, [name], MD Primary Care: [name], MD Primary Cardiologist: No primary care provider on file.   Chief Complaint:   POST OP FOLLOW UP CARDIOVASCULAR SURGERY OPERATIVE NOTE  11/04/2019  Surgeon:  Gaye Pollack, MD  First Assistant: Jadene Pierini,  PA-C   Preoperative Diagnosis:  Severe left main and multi-vessel coronary artery disease   Postoperative Diagnosis:  Same   Procedure:  1. Median Sternotomy 2. Extracorporeal circulation 3.   Coronary artery bypass grafting x 4   Left internal mammary artery graft to the LAD  SVG to second diagonal  SVG to OM1  SVG to OM2 4.   Endoscopic vein harvest from the right leg   Anesthesia:  General Endotracheal History of Present Illness:    Patient is a 79 year old female status post the above described procedures in the office on today's date for a wound check.  She was scheduled for suture removal by the nurses today but on examination she was found to have some swelling in the region just above the below-knee EVH incision.  The patient reports that the swelling began approximately a day or so ago.  She has had significant ecchymosis in the lower extremity since surgery but reports the swelling is new.  She describes an achiness discomfort in that region.  She has not had any fevers or chills.  There has been no drainage associated with the incision.  There is noted some erythema.  Additionally the patient reports that she is having significant difficulty seeing out of her left eye.  Reportedly this started the day after she left the hospital and she has seen a ophthalmologist who reports that this is related to atheroemboli.   She states this doctor is uncertain how much vision she will regain.      Past Medical History:  Diagnosis Date  . Anxiety   . Arthritis   . Asthma   . Bilateral edema of lower extremity   . Complication of anesthesia    difficulty waking  . Congestive heart failure (CHF) ([place])   . COPD (chronic obstructive pulmonary disease) ([place])   . Coronary artery disease   . Dyspnea on exertion   . GERD (gastroesophageal reflux disease)   . History of melanoma excision    left greast toe 2015  . History of squamous cell carcinoma excision    face--  multiple excisions  . History of subdural hematoma    2008  . Hypertension   . OSA (obstructive sleep apnea)    intolerant  . Thrombocytosis ([place])    takes hydoxyurea--  MONITORED BY DR Hinton Rao [facility])  . VIN III (vulvar intraepithelial neoplasia III)   . Wears dentures    UPPER AND LOWER PARTIAL  . Wears glasses      Social History   Tobacco Use  Smoking Status Never Smoker  Smokeless Tobacco Never Used    Social History   Substance and Sexual Activity  Alcohol Use No     Allergies  Allergen Reactions  . Cephalexin Hives  . Lactose Intolerance (Gi) Diarrhea  . Penicillins Rash    Childhood reaction Did it involve swelling of the face/tongue/throat, SOB, or low BP? No Did it involve sudden or severe rash/hives, skin peeling, or any reaction on the inside of your mouth or nose? No Did you need to seek medical attention at a hospital or doctor's office? No When did it last happen?50 years ago If all above answers are "NO", may proceed with cephalosporin use.     Current Outpatient Medications  Medication Sig Dispense Refill  . ammonium lactate (LAC-HYDRIN) 12 % lotion Apply 1 application topically daily as needed (skin spots).     . Ascorbic Acid (VITAMIN C) 1000 MG tablet Take 1,000 mg by mouth daily.    Marland Kitchen aspirin EC 325 MG EC tablet Take 1 tablet (325 mg total) by mouth daily.    Marland Kitchen  atorvastatin (LIPITOR) 40 MG tablet Take 1 tablet (40 mg total) by mouth daily at 6 PM. 30 tablet 1  . Calcium Carb-Cholecalciferol (CALCIUM 600 + D PO) Take 1 tablet by mouth in the morning and at bedtime.     Marland Kitchen erythromycin ophthalmic ointment Place 1 application into both eyes daily as needed (pain/ irritation).     . ferrous Q000111Q C-folic acid (TRINSICON / FOLTRIN) capsule Take 1 capsule by mouth 2 (two) times daily after a meal. 60 capsule 1  . fluticasone (FLONASE) 50 MCG/ACT nasal spray Place 2 sprays into both nostrils at bedtime.     . hydroxyurea (HYDREA) 500 MG capsule Take 500 mg by mouth daily.     . lansoprazole (PREVACID) 30 MG capsule Take 30 mg by mouth every morning.     . Magnesium 250 MG TABS Take 250 mg by mouth at bedtime.     . metoprolol tartrate (LOPRESSOR) 25 MG tablet Take 1 tablet (25 mg total) by mouth 2 (two) times daily. 30 tablet 1  . Multiple Vitamins-Minerals (PRESERVISION AREDS 2) CAPS Take 1 capsule by mouth in the morning and at bedtime.    . mupirocin ointment (BACTROBAN) 2 % Apply 1 application topically daily as needed (after cancer removal procedures).     . traMADol (ULTRAM) 50 MG tablet Take 1 tablet (50 mg total) by mouth every 6 (six) hours as needed for up to 7 days for moderate pain. 28 tablet 0  . Vitamin D, Ergocalciferol, (DRISDOL) 1.25 MG (50000 UT) CAPS capsule Take 50,000 Units by mouth every 14 (fourteen) days.    . ciprofloxacin (CIPRO) 250 MG tablet Take 1 tablet (250 mg total) by mouth 2 (two) times daily for 10 days. 20 tablet 0   No current facility-administered medications for this visit.       Physical Exam: BP 139/89 (BP Location: Right Arm, Patient Position: Sitting, Cuff Size: Normal)   Pulse 68   Temp 97.9 F (36.6 C) (Temporal)   Resp 20   Ht 5\' 6"  (1.676 m)   SpO2 96% Comment: RA  BMI 27.15 kg/m   General appearance: alert, cooperative and no distress Heart: regular rate and rhythm Lungs: clear to  auscultation bilaterally Abdomen: benign exam Extremities: + edema Wound: Right EVH site incision healing well with mild erythema and moderate echymosis of thigh and lower lef. no definite fluid collection noted Left eye some conjunctival erethema, decreased visual acuity grossly  Diagnostic Studies & Laboratory data:     Recent Radiology Findings:   No results found.    Recent Lab  Findings: Lab Results  Component Value Date   WBC 25.9 (H) 11/08/2019   HGB 9.4 (L) 11/08/2019   HCT 29.1 (L) 11/08/2019   PLT 117 (L) 11/08/2019   GLUCOSE 109 (H) 11/08/2019   CHOL 139 10/25/2019   TRIG 120 10/25/2019   HDL 43 10/25/2019   LDLCALC 74 10/25/2019   ALT 22 10/25/2019   AST 26 10/25/2019   NA 139 11/08/2019   K 3.8 11/08/2019   CL 101 11/08/2019   CREATININE 1.16 (H) 11/08/2019   BUN 21 11/08/2019   CO2 25 11/08/2019   TSH 2.430 10/25/2019   INR 1.5 (H) 11/04/2019   HGBA1C 5.2 10/25/2019      Assessment / Plan:  Early cellulitis of right lower leg EVH site will start on cipro 250 bid for 10 days.   Left eye decreased visual acuity- has been evaluated by opthomology per patient with uncertain prognosis- cont f/iu with them. No significant carotid disease on preop Duplex studies. No aortic valve calcifications described on echo, only mild aortic valve sclerosis.  We will see in planned f/u with Dr Cyndia Bent in a few weeks, prior if signs/sx worsen before then         John Giovanni, PA-C 11/15/2019 12:16 PM      [name], female   DOB: 05/26/1941, 79 y.o.   MRN: [id number]

## 2019-11-15 NOTE — Progress Notes (Signed)
Pt comes in today for suture removal.  She is s/p CABG on 11/04/19. Four chest tube incision sites to upper abdomen w/ sutures intact, incisions well approximated, and without s/s of infection. Sutures removed per standard protocol w/o difficulty. Instructed pt to keep sites clean (w/ mild soap and water) and dry. To notify office of any s/s of infection or other problems. Pt c/o pain in her R leg. Excessive bruising noted throughout leg, and there is swelling above her incision which she reports has increased over the past day. Evonnie Pat, PA notified.

## 2019-11-15 NOTE — Patient Instructions (Signed)
Keep legs elevated when not ambulating cipro 250 bid x 10 days

## 2019-11-17 ENCOUNTER — Other Ambulatory Visit: Payer: Self-pay | Admitting: Surgical

## 2019-11-22 DIAGNOSIS — E669 Obesity, unspecified: Secondary | ICD-10-CM | POA: Diagnosis not present

## 2019-11-22 DIAGNOSIS — Z6829 Body mass index (BMI) 29.0-29.9, adult: Secondary | ICD-10-CM | POA: Diagnosis not present

## 2019-11-22 DIAGNOSIS — E663 Overweight: Secondary | ICD-10-CM | POA: Diagnosis not present

## 2019-11-22 DIAGNOSIS — H539 Unspecified visual disturbance: Secondary | ICD-10-CM | POA: Diagnosis not present

## 2019-11-22 DIAGNOSIS — M7989 Other specified soft tissue disorders: Secondary | ICD-10-CM | POA: Diagnosis not present

## 2019-11-22 DIAGNOSIS — R110 Nausea: Secondary | ICD-10-CM | POA: Diagnosis not present

## 2019-11-22 DIAGNOSIS — M545 Low back pain: Secondary | ICD-10-CM | POA: Diagnosis not present

## 2019-11-24 ENCOUNTER — Encounter: Payer: Self-pay | Admitting: Cardiology

## 2019-11-24 ENCOUNTER — Ambulatory Visit (INDEPENDENT_AMBULATORY_CARE_PROVIDER_SITE_OTHER): Payer: PPO | Admitting: Cardiology

## 2019-11-24 ENCOUNTER — Ambulatory Visit: Payer: PPO

## 2019-11-24 ENCOUNTER — Other Ambulatory Visit: Payer: Self-pay

## 2019-11-24 VITALS — BP 132/76 | HR 70 | Ht 66.0 in | Wt 162.0 lb

## 2019-11-24 DIAGNOSIS — I10 Essential (primary) hypertension: Secondary | ICD-10-CM | POA: Diagnosis not present

## 2019-11-24 DIAGNOSIS — N289 Disorder of kidney and ureter, unspecified: Secondary | ICD-10-CM | POA: Diagnosis not present

## 2019-11-24 DIAGNOSIS — I2510 Atherosclerotic heart disease of native coronary artery without angina pectoris: Secondary | ICD-10-CM

## 2019-11-24 DIAGNOSIS — E782 Mixed hyperlipidemia: Secondary | ICD-10-CM

## 2019-11-24 DIAGNOSIS — Z951 Presence of aortocoronary bypass graft: Secondary | ICD-10-CM | POA: Diagnosis not present

## 2019-11-24 NOTE — Patient Instructions (Signed)
Medication Instructions:  No medication changes *If you need a refill on your cardiac medications before your next appointment, please call your pharmacy*   Lab Work: Your physician recommends that you have a BMET, CBC, TSH and LFT's today in the office.  If you have labs (blood work) drawn today and your tests are completely normal, you will receive your results only by: Marland Kitchen MyChart Message (if you have MyChart) OR . A paper copy in the mail If you have any lab test that is abnormal or we need to change your treatment, we will call you to review the results.   Testing/Procedures:  WHY IS MY DOCTOR PRESCRIBING ZIO? The Zio system is proven and trusted by physicians to detect and diagnose irregular heart rhythms -- and has been prescribed to hundreds of thousands of patients.  The FDA has cleared the Zio system to monitor for many different kinds of irregular heart rhythms. In a study, physicians were able to reach a diagnosis 90% of the time with the Zio system1.  You can wear the Zio monitor -- a small, discreet, comfortable patch -- during your normal day-to-day activity, including while you sleep, shower, and exercise, while it records every single heartbeat for analysis.  1Barrett, P., et al. Comparison of 24 Hour Holter Monitoring Versus 14 Day Novel Adhesive Patch Electrocardiographic Monitoring. [place], 2014.  ZIO VS. HOLTER MONITORING The Zio monitor can be comfortably worn for up to 14 days. Holter monitors can be worn for 24 to 48 hours, limiting the time to record any irregular heart rhythms you may have. Zio is able to capture data for the 51% of patients who have their first symptom-triggered arrhythmia after 48 hours.1  LIVE WITHOUT RESTRICTIONS The Zio ambulatory cardiac monitor is a small, unobtrusive, and water-resistant patch--you might even forget you're wearing it. The Zio monitor records and stores every beat of your heart, whether you're  sleeping, working out, or showering.  You are wearing a live monitor which means someone is watching the data at all times. They will notify you of life threating events.  Wear the monitor for 2 weeks. Remove 12/08/19.    Follow-Up: At [facility], you and your health needs are our priority.  As part of our continuing mission to provide you with exceptional heart care, we have created designated Provider Care Teams.  These Care Teams include your primary Cardiologist (physician) and Advanced Practice Providers (APPs -  Physician Assistants and Nurse Practitioners) who all work together to provide you with the care you need, when you need it.  We recommend signing up for the patient portal called "MyChart".  Sign up information is provided on this After Visit Summary.  MyChart is used to connect with patients for Virtual Visits (Telemedicine).  Patients are able to view lab/test results, encounter notes, upcoming appointments, etc.  Non-urgent messages can be sent to your provider as well.   To learn more about what you can do with MyChart, go to [email or web address].    Your next appointment:   2 week(s)  The format for your next appointment:   In Person  Provider:   Jyl Heinz, MD   Other Instructions NA

## 2019-11-24 NOTE — Progress Notes (Signed)
Cardiology Office Note:    Date:  11/24/2019   ID:  Yolanda Flowers, DOB 04/12/1941, MRN [id number]  PCP:  [name], MD  Cardiologist:  Jenean Lindau, MD   Referring MD: [name], MD    ASSESSMENT:    1. Coronary artery disease involving native coronary artery of native heart without angina pectoris   2. Combined hyperlipidemia   3. Essential hypertension   4. S/P CABG x 4   5. Renal insufficiency    PLAN:    In order of problems listed above:  1. Coronary artery disease: Secondary prevention stressed with the patient.  Importance of compliance with diet medication stressed and she vocalized understanding.  She feels weak and tired and so we will do complete blood work today including Chem-7 CBC TSH and liver panel. 2. Essential hypertension: Blood pressure is stable 3. Mixed dyslipidemia: Patient on statin therapy. 4. Cellulitis: Managed by surgical colleagues.  Her son mentions to me that after the antibiotic has been changed the issue with cellulitis is improving.  There appears like there is a collection of fluid which is not too large in that area.  This is addressed by our surgical colleagues.  I presume that medical management is the first approach and if necessary they will intervene on it.  I told the son to be in touch with cardiothoracic surgery and keep them updated about this finding and he vocalized understanding. 5. Patient will be seen in follow-up appointment in 2 weeks or earlier if the patient has any concerns    Medication Adjustments/Labs and Tests Ordered: Current medicines are reviewed at length with the patient today.  Concerns regarding medicines are outlined above.  No orders of the defined types were placed in this encounter.  No orders of the defined types were placed in this encounter.    No chief complaint on file.    History of Present Illness:    Yolanda Flowers is a 79 y.o. female.  Patient has past medical history of coronary artery  disease and has undergone bypass surgery.  Overall she is feeling well.  Her son accompanies her for this visit.  She has history of essential hypertension.  She has swelling in the lower extremity.  Her right leg is bigger than the left.  She mentions to me that her recent DVT study was unremarkable.  She says that her right leg has always been bigger than her left leg.  She has a evidence of cellulitis in the area of the graft harvest site in the right lower extremity and this is managed by our surgical colleagues and they are in close monitor of the situation.  Her primary care physician changed her antibiotic because she could not tolerate the first antibiotic.  Again the son mentions to me that he will keep the surgical doctors aware of the situation.  No chest pain orthopnea or PND.  Overall she is recovering well.  At the time of my evaluation, the patient is alert awake oriented and in no distress.  Past Medical History:  Diagnosis Date  . Anxiety   . Arthritis   . Asthma   . Bilateral edema of lower extremity   . Complication of anesthesia    difficulty waking  . Congestive heart failure (CHF) ([place])   . COPD (chronic obstructive pulmonary disease) ([place])   . Coronary artery disease   . Dyspnea on exertion   . GERD (gastroesophageal reflux disease)   . History of  melanoma excision    left greast toe 2015  . History of squamous cell carcinoma excision    face--  multiple excisions  . History of subdural hematoma    2008  . Hypertension   . OSA (obstructive sleep apnea)    intolerant  . Thrombocytosis ([place])    takes hydoxyurea--  MONITORED BY DR Hinton Rao [facility])  . VIN III (vulvar intraepithelial neoplasia III)   . Wears dentures    UPPER AND LOWER PARTIAL  . Wears glasses     Past Surgical History:  Procedure Laterality Date  . ABDOMINAL HYSTERECTOMY  age 63  . CARDIAC CATHETERIZATION  03/172021  . CORONARY ARTERY BYPASS GRAFT N/A 11/04/2019   Procedure:  CORONARY ARTERY BYPASS GRAFTING (CABG) x 4, with ENDOSCOPIC HARVESTING OF RIGHT GREATER SAPHENOUS VEIN.;  Surgeon: Gaye Pollack, MD;  Location: [place] OR;  Service: Open Heart Surgery;  Laterality: N/A;  . INTRAVASCULAR ULTRASOUND/IVUS N/A 11/03/2019   Procedure: Intravascular Ultrasound/IVUS;  Surgeon: Nelva Bush, MD;  Location: [place] [facility] LAB;  Service: Cardiovascular;  Laterality: N/A;  . KNEE ARTHROSCOPY Left 2004  . LEFT HEART CATH AND CORONARY ANGIOGRAPHY N/A 11/03/2019   Procedure: LEFT HEART CATH AND CORONARY ANGIOGRAPHY;  Surgeon: Nelva Bush, MD;  Location: [place] CV LAB;  Service: Cardiovascular;  Laterality: N/A;  . MELANOMA EXCISION  2015   left great toe  . REPAIR PERONEAL TENDONS ANKLE  2004  . SUBDURAL HEMATOMA EVACUATION VIA CRANIOTOMY  2008      week later  post-op  [facility]  . TEE WITHOUT CARDIOVERSION N/A 11/04/2019   Procedure: TRANSESOPHAGEAL ECHOCARDIOGRAM (TEE);  Surgeon: Gaye Pollack, MD;  Location: [place];  Service: Open Heart Surgery;  Laterality: N/A;  . VULVECTOMY N/A 10/24/2015   Procedure: WIDE LOCAL EXCISION OF THE VULVA ;  Surgeon: Everitt Amber, MD;  Location: [place];  Service: Gynecology;  Laterality: N/A;    Current Medications: Current Meds  Medication Sig  . ammonium lactate (LAC-HYDRIN) 12 % lotion Apply 1 application topically daily as needed (skin spots).   . Ascorbic Acid (VITAMIN C) 1000 MG tablet Take 1,000 mg by mouth daily.  Marland Kitchen aspirin EC 325 MG EC tablet Take 1 tablet (325 mg total) by mouth daily.  Marland Kitchen atorvastatin (LIPITOR) 40 MG tablet Take 1 tablet (40 mg total) by mouth daily at 6 PM.  . Calcium Carb-Cholecalciferol (CALCIUM 600 + D PO) Take 1 tablet by mouth in the morning and at bedtime.   . ciprofloxacin (CIPRO) 250 MG tablet Take 1 tablet (250 mg total) by mouth 2 (two) times daily for 10 days.  Marland Kitchen erythromycin ophthalmic ointment Place 1 application into both eyes daily as needed (pain/  irritation).   . ferrous [id number] C-folic acid (TRINSICON / FOLTRIN) capsule Take 1 capsule by mouth 2 (two) times daily after a meal.  . fluticasone (FLONASE) 50 MCG/ACT nasal spray Place 2 sprays into both nostrils at bedtime.   . hydroxyurea (HYDREA) 500 MG capsule Take 500 mg by mouth daily.   . lansoprazole (PREVACID) 30 MG capsule Take 30 mg by mouth every morning.   . Magnesium 250 MG TABS Take 250 mg by mouth at bedtime.   . metoprolol tartrate (LOPRESSOR) 25 MG tablet Take 1 tablet (25 mg total) by mouth 2 (two) times daily.  . Multiple Vitamins-Minerals (PRESERVISION AREDS 2) CAPS Take 1 capsule by mouth in the morning and at bedtime.  . mupirocin ointment (BACTROBAN) 2 %  Apply 1 application topically daily as needed (after cancer removal procedures).   . traMADol (ULTRAM) 50 MG tablet Take 50 mg by mouth 2 (two) times daily as needed.  . Vitamin D, Ergocalciferol, (DRISDOL) 1.25 MG (50000 UT) CAPS capsule Take 50,000 Units by mouth every 14 (fourteen) days.     Allergies:   Cephalexin, Lactose intolerance (gi), and Penicillins   Social History   Socioeconomic History  . Marital status: Married    Spouse name: Not on file  . Number of children: Not on file  . Years of education: Not on file  . Highest education level: Not on file  Occupational History  . Not on file  Tobacco Use  . Smoking status: Never Smoker  . Smokeless tobacco: Never Used  Substance and Sexual Activity  . Alcohol use: No  . Drug use: No  . Sexual activity: Not on file  Other Topics Concern  . Not on file  Social History Narrative  . Not on file   Social Determinants of Health   Financial Resource Strain:   . Difficulty of Paying Living Expenses:   Food Insecurity:   . Worried About Charity fundraiser in the Last Year:   . Arboriculturist in the Last Year:   Transportation Needs:   . Film/video editor (Medical):   Marland Kitchen Lack of Transportation (Non-Medical):   Physical  Activity:   . Days of Exercise per Week:   . Minutes of Exercise per Session:   Stress:   . Feeling of Stress :   Social Connections:   . Frequency of Communication with Friends and Family:   . Frequency of Social Gatherings with Friends and Family:   . Attends Religious Services:   . Active Member of Clubs or Organizations:   . Attends Archivist Meetings:   Marland Kitchen Marital Status:      Family History: The patient's family history includes Arthritis in her brother, father, mother, and sister; Asthma in her father; Clotting disorder in her mother; Emphysema in her father; Heart disease in her brother, father, mother, and sister; Hypertension in her brother, father, mother, and sister; Kidney failure in her father; Stroke in her father.  ROS:   Please see the history of present illness.    All other systems reviewed and are negative.  EKGs/Labs/Other Studies Reviewed:    The following studies were reviewed today: End, Harrell Gave, MD (Primary)    Procedures  Intravascular Ultrasound/IVUS  LEFT HEART CATH AND CORONARY ANGIOGRAPHY  Conclusion  Conclusions: 1. Multivessel coronary artery disease with heavy calcification of the major coronary arteries.  There is a 60% eccentric stenosis involving the distal LMCA that extends into the ostial LCx where there is at least 80% narrowing.  There is 50% stenosis of the mid LAD as well as chronic total occlusions of D1 and D2. 2. Hyperdynamic left ventricular systolic function with normal filling pressure.  Recommendations: 1. Inpatient cardiac surgery consultation for management of accelerating angina, including chest pain during engagement of the left coronary artery.  While the LMCA stenosis is not critical by angiography or IVUS (MLA at least 8-10 mm^2), the lesion leads to severe stenosis of the ostial LCx.  The LCx stenosis cannot be treated percutaneously without involving the LMCA/LAD. 2. Start IV heparin 2 hours after deflation  of TR band. 3. Obtain transthoracic echocardiogram. 4. Aggressive secondary prevention, including high-intensity statin therapy. 5. Start low-dose beta blocker for antianginal therapy and blood pressure control.  Nelva Bush, MD [facility]       Recent Labs: 02/09/2019: Pro B Natriuretic peptide (BNP) 70.0 10/25/2019: ALT 22; TSH 2.430 11/05/2019: Magnesium 2.4 11/08/2019: BUN 21; Creatinine, Ser 1.16; Hemoglobin 9.4; Platelets 117; Potassium 3.8; Sodium 139  Recent Lipid Panel    Component Value Date/Time   CHOL 139 10/25/2019 0948   TRIG 120 10/25/2019 0948   HDL 43 10/25/2019 0948   CHOLHDL 3.2 10/25/2019 0948   LDLCALC 74 10/25/2019 0948    Physical Exam:    VS:  BP 132/76   Pulse 70   Ht 5\' 6"  (1.676 m)   Wt 162 lb (73.5 kg)   SpO2 96%   BMI 26.15 kg/m     Wt Readings from Last 3 Encounters:  11/24/19 162 lb (73.5 kg)  11/10/19 168 lb 3.4 oz (76.3 kg)  10/25/19 173 lb (78.5 kg)     GEN: Patient is in no acute distress HEENT: Normal NECK: No JVD; No carotid bruits LYMPHATICS: No lymphadenopathy CARDIAC: Hear sounds regular, 2/6 systolic murmur at the apex. RESPIRATORY:  Clear to auscultation without rales, wheezing or rhonchi  ABDOMEN: Soft, non-tender, non-distended MUSCULOSKELETAL:  No edema; No deformity  SKIN: Warm and dry NEUROLOGIC:  Alert and oriented x 3 PSYCHIATRIC:  Normal affect   Signed, Jenean Lindau, MD  11/24/2019 10:13 AM    [place]

## 2019-11-25 ENCOUNTER — Emergency Department (HOSPITAL_COMMUNITY): Payer: PPO

## 2019-11-25 ENCOUNTER — Encounter (HOSPITAL_COMMUNITY): Payer: Self-pay

## 2019-11-25 ENCOUNTER — Telehealth: Payer: Self-pay

## 2019-11-25 ENCOUNTER — Inpatient Hospital Stay (HOSPITAL_COMMUNITY)
Admission: EM | Admit: 2019-11-25 | Discharge: 2019-11-30 | DRG: 040 | Disposition: A | Discharge status: Rehabilitation Facility | Payer: PPO | Attending: Family Medicine | Admitting: Family Medicine

## 2019-11-25 DIAGNOSIS — Z85820 Personal history of malignant melanoma of skin: Secondary | ICD-10-CM | POA: Diagnosis not present

## 2019-11-25 DIAGNOSIS — M549 Dorsalgia, unspecified: Secondary | ICD-10-CM | POA: Diagnosis not present

## 2019-11-25 DIAGNOSIS — I739 Peripheral vascular disease, unspecified: Secondary | ICD-10-CM | POA: Diagnosis present

## 2019-11-25 DIAGNOSIS — E782 Mixed hyperlipidemia: Secondary | ICD-10-CM | POA: Diagnosis present

## 2019-11-25 DIAGNOSIS — I6349 Cerebral infarction due to embolism of other cerebral artery: Secondary | ICD-10-CM | POA: Diagnosis not present

## 2019-11-25 DIAGNOSIS — I130 Hypertensive heart and chronic kidney disease with heart failure and stage 1 through stage 4 chronic kidney disease, or unspecified chronic kidney disease: Secondary | ICD-10-CM | POA: Diagnosis not present

## 2019-11-25 DIAGNOSIS — J449 Chronic obstructive pulmonary disease, unspecified: Secondary | ICD-10-CM | POA: Diagnosis present

## 2019-11-25 DIAGNOSIS — Z86006 Personal history of melanoma in-situ: Secondary | ICD-10-CM | POA: Diagnosis not present

## 2019-11-25 DIAGNOSIS — I6340 Cerebral infarction due to embolism of unspecified cerebral artery: Secondary | ICD-10-CM

## 2019-11-25 DIAGNOSIS — H3410 Central retinal artery occlusion, unspecified eye: Secondary | ICD-10-CM | POA: Diagnosis not present

## 2019-11-25 DIAGNOSIS — Z825 Family history of asthma and other chronic lower respiratory diseases: Secondary | ICD-10-CM

## 2019-11-25 DIAGNOSIS — G4733 Obstructive sleep apnea (adult) (pediatric): Secondary | ICD-10-CM | POA: Diagnosis present

## 2019-11-25 DIAGNOSIS — R29703 NIHSS score 3: Secondary | ICD-10-CM | POA: Diagnosis not present

## 2019-11-25 DIAGNOSIS — I671 Cerebral aneurysm, nonruptured: Secondary | ICD-10-CM | POA: Diagnosis present

## 2019-11-25 DIAGNOSIS — Z87891 Personal history of nicotine dependence: Secondary | ICD-10-CM

## 2019-11-25 DIAGNOSIS — Z66 Do not resuscitate: Secondary | ICD-10-CM | POA: Diagnosis not present

## 2019-11-25 DIAGNOSIS — Z79899 Other long term (current) drug therapy: Secondary | ICD-10-CM | POA: Diagnosis not present

## 2019-11-25 DIAGNOSIS — N179 Acute kidney failure, unspecified: Secondary | ICD-10-CM | POA: Diagnosis not present

## 2019-11-25 DIAGNOSIS — I6389 Other cerebral infarction: Secondary | ICD-10-CM | POA: Diagnosis not present

## 2019-11-25 DIAGNOSIS — D539 Nutritional anemia, unspecified: Secondary | ICD-10-CM | POA: Diagnosis present

## 2019-11-25 DIAGNOSIS — T82837A Hemorrhage of cardiac prosthetic devices, implants and grafts, initial encounter: Secondary | ICD-10-CM | POA: Diagnosis not present

## 2019-11-25 DIAGNOSIS — G4489 Other headache syndrome: Secondary | ICD-10-CM | POA: Diagnosis not present

## 2019-11-25 DIAGNOSIS — I2510 Atherosclerotic heart disease of native coronary artery without angina pectoris: Secondary | ICD-10-CM | POA: Diagnosis present

## 2019-11-25 DIAGNOSIS — R531 Weakness: Secondary | ICD-10-CM | POA: Diagnosis not present

## 2019-11-25 DIAGNOSIS — I6310 Cerebral infarction due to embolism of unspecified precerebral artery: Secondary | ICD-10-CM

## 2019-11-25 DIAGNOSIS — K219 Gastro-esophageal reflux disease without esophagitis: Secondary | ICD-10-CM | POA: Diagnosis not present

## 2019-11-25 DIAGNOSIS — G8194 Hemiplegia, unspecified affecting left nondominant side: Secondary | ICD-10-CM | POA: Diagnosis present

## 2019-11-25 DIAGNOSIS — I509 Heart failure, unspecified: Secondary | ICD-10-CM | POA: Diagnosis present

## 2019-11-25 DIAGNOSIS — Z880 Allergy status to penicillin: Secondary | ICD-10-CM

## 2019-11-25 DIAGNOSIS — N1832 Chronic kidney disease, stage 3b: Secondary | ICD-10-CM | POA: Diagnosis present

## 2019-11-25 DIAGNOSIS — Z951 Presence of aortocoronary bypass graft: Secondary | ICD-10-CM

## 2019-11-25 DIAGNOSIS — Z881 Allergy status to other antibiotic agents status: Secondary | ICD-10-CM

## 2019-11-25 DIAGNOSIS — D6859 Other primary thrombophilia: Secondary | ICD-10-CM | POA: Diagnosis not present

## 2019-11-25 DIAGNOSIS — Z8261 Family history of arthritis: Secondary | ICD-10-CM

## 2019-11-25 DIAGNOSIS — I358 Other nonrheumatic aortic valve disorders: Secondary | ICD-10-CM | POA: Diagnosis present

## 2019-11-25 DIAGNOSIS — I6529 Occlusion and stenosis of unspecified carotid artery: Secondary | ICD-10-CM | POA: Diagnosis present

## 2019-11-25 DIAGNOSIS — T451X5A Adverse effect of antineoplastic and immunosuppressive drugs, initial encounter: Secondary | ICD-10-CM | POA: Diagnosis not present

## 2019-11-25 DIAGNOSIS — R7989 Other specified abnormal findings of blood chemistry: Secondary | ICD-10-CM | POA: Diagnosis present

## 2019-11-25 DIAGNOSIS — I10 Essential (primary) hypertension: Secondary | ICD-10-CM | POA: Diagnosis not present

## 2019-11-25 DIAGNOSIS — Z20822 Contact with and (suspected) exposure to covid-19: Secondary | ICD-10-CM | POA: Diagnosis present

## 2019-11-25 DIAGNOSIS — Z90710 Acquired absence of both cervix and uterus: Secondary | ICD-10-CM

## 2019-11-25 DIAGNOSIS — L03115 Cellulitis of right lower limb: Secondary | ICD-10-CM

## 2019-11-25 DIAGNOSIS — I639 Cerebral infarction, unspecified: Secondary | ICD-10-CM | POA: Diagnosis present

## 2019-11-25 DIAGNOSIS — G8929 Other chronic pain: Secondary | ICD-10-CM | POA: Diagnosis not present

## 2019-11-25 DIAGNOSIS — H5462 Unqualified visual loss, left eye, normal vision right eye: Secondary | ICD-10-CM | POA: Diagnosis present

## 2019-11-25 DIAGNOSIS — E875 Hyperkalemia: Secondary | ICD-10-CM | POA: Diagnosis not present

## 2019-11-25 DIAGNOSIS — Z7982 Long term (current) use of aspirin: Secondary | ICD-10-CM

## 2019-11-25 DIAGNOSIS — D473 Essential (hemorrhagic) thrombocythemia: Secondary | ICD-10-CM | POA: Diagnosis present

## 2019-11-25 DIAGNOSIS — R278 Other lack of coordination: Secondary | ICD-10-CM | POA: Diagnosis present

## 2019-11-25 DIAGNOSIS — Z8249 Family history of ischemic heart disease and other diseases of the circulatory system: Secondary | ICD-10-CM

## 2019-11-25 DIAGNOSIS — H5340 Unspecified visual field defects: Secondary | ICD-10-CM | POA: Diagnosis not present

## 2019-11-25 DIAGNOSIS — I6202 Nontraumatic subacute subdural hemorrhage: Secondary | ICD-10-CM | POA: Diagnosis present

## 2019-11-25 DIAGNOSIS — E785 Hyperlipidemia, unspecified: Secondary | ICD-10-CM | POA: Diagnosis not present

## 2019-11-25 DIAGNOSIS — H269 Unspecified cataract: Secondary | ICD-10-CM | POA: Diagnosis present

## 2019-11-25 DIAGNOSIS — R778 Other specified abnormalities of plasma proteins: Secondary | ICD-10-CM | POA: Diagnosis present

## 2019-11-25 DIAGNOSIS — R109 Unspecified abdominal pain: Secondary | ICD-10-CM

## 2019-11-25 DIAGNOSIS — I110 Hypertensive heart disease with heart failure: Secondary | ICD-10-CM | POA: Diagnosis not present

## 2019-11-25 DIAGNOSIS — K5909 Other constipation: Secondary | ICD-10-CM | POA: Diagnosis not present

## 2019-11-25 DIAGNOSIS — R002 Palpitations: Secondary | ICD-10-CM | POA: Diagnosis not present

## 2019-11-25 DIAGNOSIS — I69398 Other sequelae of cerebral infarction: Secondary | ICD-10-CM | POA: Diagnosis not present

## 2019-11-25 DIAGNOSIS — D62 Acute posthemorrhagic anemia: Secondary | ICD-10-CM | POA: Diagnosis not present

## 2019-11-25 DIAGNOSIS — I69392 Facial weakness following cerebral infarction: Secondary | ICD-10-CM | POA: Diagnosis not present

## 2019-11-25 DIAGNOSIS — Z87412 Personal history of vulvar dysplasia: Secondary | ICD-10-CM

## 2019-11-25 DIAGNOSIS — Z823 Family history of stroke: Secondary | ICD-10-CM

## 2019-11-25 DIAGNOSIS — D72828 Other elevated white blood cell count: Secondary | ICD-10-CM | POA: Diagnosis not present

## 2019-11-25 DIAGNOSIS — R32 Unspecified urinary incontinence: Secondary | ICD-10-CM | POA: Diagnosis not present

## 2019-11-25 DIAGNOSIS — Z832 Family history of diseases of the blood and blood-forming organs and certain disorders involving the immune mechanism: Secondary | ICD-10-CM

## 2019-11-25 DIAGNOSIS — Z841 Family history of disorders of kidney and ureter: Secondary | ICD-10-CM

## 2019-11-25 DIAGNOSIS — I63443 Cerebral infarction due to embolism of bilateral cerebellar arteries: Principal | ICD-10-CM | POA: Diagnosis present

## 2019-11-25 DIAGNOSIS — Z79891 Long term (current) use of opiate analgesic: Secondary | ICD-10-CM

## 2019-11-25 HISTORY — DX: Cerebral infarction, unspecified: I63.9

## 2019-11-25 LAB — CBC WITH DIFFERENTIAL/PLATELET
Abs Immature Granulocytes: 0.5 10*3/uL — ABNORMAL HIGH (ref 0.00–0.07)
Basophils Absolute: 0.9 10*3/uL — ABNORMAL HIGH (ref 0.0–0.2)
Basophils Absolute: 0.7 10*3/uL — ABNORMAL HIGH (ref 0.0–0.1)
Basophils Relative: 4 %
Basos: 4 %
EOS (ABSOLUTE): 0.9 10*3/uL — ABNORMAL HIGH (ref 0.0–0.4)
Eos: 4 %
Eosinophils Absolute: 0.5 10*3/uL (ref 0.0–0.5)
Eosinophils Relative: 3 %
HCT: 37.6 % (ref 36.0–46.0)
Hematocrit: 36 % (ref 34.0–46.6)
Hemoglobin: 11.9 g/dL (ref 11.1–15.9)
Hemoglobin: 11.6 g/dL — ABNORMAL LOW (ref 12.0–15.0)
Lymphocytes Absolute: 1.8 10*3/uL (ref 0.7–3.1)
Lymphocytes Relative: 5 %
Lymphs Abs: 0.9 10*3/uL (ref 0.7–4.0)
Lymphs: 8 %
MCH: 32.3 pg (ref 26.6–33.0)
MCH: 32.2 pg (ref 26.0–34.0)
MCHC: 33.1 g/dL (ref 31.5–35.7)
MCHC: 30.9 g/dL (ref 30.0–36.0)
MCV: 98 fL — ABNORMAL HIGH (ref 79–97)
MCV: 104.4 fL — ABNORMAL HIGH (ref 80.0–100.0)
Monocytes Absolute: 1.3 10*3/uL — ABNORMAL HIGH (ref 0.1–0.9)
Monocytes Absolute: 1.1 10*3/uL — ABNORMAL HIGH (ref 0.1–1.0)
Monocytes Relative: 6 %
Monocytes: 6 %
Myelocytes: 2 %
Neutro Abs: 14.5 10*3/uL — ABNORMAL HIGH (ref 1.7–7.7)
Neutrophils Absolute: 16.7 10*3/uL — ABNORMAL HIGH (ref 1.4–7.0)
Neutrophils Relative %: 79 %
Neutrophils: 75 %
Platelets: 169 10*3/uL (ref 150–450)
Platelets: 157 10*3/uL (ref 150–400)
Promyelocytes Relative: 1 %
RBC: 3.68 x10E6/uL — ABNORMAL LOW (ref 3.77–5.28)
RBC: 3.6 MIL/uL — ABNORMAL LOW (ref 3.87–5.11)
RDW: 19.3 % — ABNORMAL HIGH (ref 11.7–15.4)
RDW: 21.5 % — ABNORMAL HIGH (ref 11.5–15.5)
WBC: 22.3 10*3/uL (ref 3.4–10.8)
WBC: 18.3 10*3/uL — ABNORMAL HIGH (ref 4.0–10.5)
nRBC: 0 /100 WBC
nRBC: 0.2 % (ref 0.0–0.2)

## 2019-11-25 LAB — COMPREHENSIVE METABOLIC PANEL
ALT: 15 U/L (ref 0–44)
AST: 27 U/L (ref 15–41)
Albumin: 3.8 g/dL (ref 3.5–5.0)
Alkaline Phosphatase: 76 U/L (ref 38–126)
Anion gap: 12 (ref 5–15)
BUN: 12 mg/dL (ref 8–23)
CO2: 25 mmol/L (ref 22–32)
Calcium: 9.4 mg/dL (ref 8.9–10.3)
Chloride: 104 mmol/L (ref 98–111)
Creatinine, Ser: 1.15 mg/dL — ABNORMAL HIGH (ref 0.44–1.00)
GFR calc Af Amer: 52 mL/min — ABNORMAL LOW (ref >60)
GFR calc non Af Amer: 45 mL/min — ABNORMAL LOW (ref >60)
Glucose, Bld: 103 mg/dL — ABNORMAL HIGH (ref 70–99)
Potassium: 4.5 mmol/L (ref 3.5–5.1)
Sodium: 141 mmol/L (ref 135–145)
Total Bilirubin: 0.8 mg/dL (ref 0.3–1.2)
Total Protein: 7.5 g/dL (ref 6.5–8.1)

## 2019-11-25 LAB — IMMATURE CELLS
MYELOCYTES: 1 % — ABNORMAL HIGH (ref 0–0)
Metamyelocytes: 2 % — ABNORMAL HIGH (ref 0–0)

## 2019-11-25 LAB — BASIC METABOLIC PANEL
BUN/Creatinine Ratio: 14 (ref 12–28)
BUN: 17 mg/dL (ref 8–27)
CO2: 22 mmol/L (ref 20–29)
Calcium: 9.5 mg/dL (ref 8.7–10.3)
Chloride: 103 mmol/L (ref 96–106)
Creatinine, Ser: 1.24 mg/dL — ABNORMAL HIGH (ref 0.57–1.00)
GFR calc Af Amer: 48 mL/min/{1.73_m2} — ABNORMAL LOW (ref >59)
GFR calc non Af Amer: 41 mL/min/{1.73_m2} — ABNORMAL LOW (ref >59)
Glucose: 114 mg/dL — ABNORMAL HIGH (ref 65–99)
Potassium: 5.1 mmol/L (ref 3.5–5.2)
Sodium: 142 mmol/L (ref 134–144)

## 2019-11-25 LAB — HEPATIC FUNCTION PANEL
ALT: 14 IU/L (ref 0–32)
AST: 27 IU/L (ref 0–40)
Albumin: 4.3 g/dL (ref 3.7–4.7)
Alkaline Phosphatase: 109 IU/L (ref 39–117)
Bilirubin Total: 0.5 mg/dL (ref 0.0–1.2)
Bilirubin, Direct: 0.18 mg/dL (ref 0.00–0.40)
Total Protein: 7.5 g/dL (ref 6.0–8.5)

## 2019-11-25 LAB — LIPID PANEL
Cholesterol: 91 mg/dL (ref 0–200)
HDL: 33 mg/dL — ABNORMAL LOW (ref >40)
LDL Cholesterol: 37 mg/dL (ref 0–99)
Total CHOL/HDL Ratio: 2.8 RATIO
Triglycerides: 104 mg/dL (ref <150)
VLDL: 21 mg/dL (ref 0–40)

## 2019-11-25 LAB — TROPONIN I (HIGH SENSITIVITY)
Troponin I (High Sensitivity): 24 ng/L — ABNORMAL HIGH (ref <18)
Troponin I (High Sensitivity): 139 ng/L (ref <18)
Troponin I (High Sensitivity): 257 ng/L (ref <18)
Troponin I (High Sensitivity): 275 ng/L (ref <18)
Troponin I (High Sensitivity): 254 ng/L (ref <18)

## 2019-11-25 LAB — TSH: TSH: 3.39 u[IU]/mL (ref 0.450–4.500)

## 2019-11-25 LAB — ECHOCARDIOGRAM LIMITED
Height: 66 in
Weight: 2592 oz

## 2019-11-25 LAB — VITAMIN B12: Vitamin B-12: 1058 pg/mL — ABNORMAL HIGH (ref 180–914)

## 2019-11-25 LAB — SARS CORONAVIRUS 2 (TAT 6-24 HRS): SARS Coronavirus 2: NEGATIVE

## 2019-11-25 IMAGING — MR MR HEAD W/O CM
12 of 13 series · 44 of 48 positions shown · non-contrast
Comparison: [DATE] CTA  head and neck.  [DATE] MRI head.

CLINICAL DATA: Focal neural deficit, greater than 6 hours, stroke
suspected. Progressive weakness for 2 weeks. Headache and left arm
weakness.

EXAM:
MRI HEAD WITHOUT CONTRAST
TECHNIQUE: Multiplanar, multiecho pulse sequences of the brain and surrounding
structures were obtained without intravenous contrast.

[Series 5: DWI · axial · 3.0mm · 0.88mm/px · z∈[-67,+60]mm · 8 of 96 slices shown (1 of 4)]
[im 1/96]
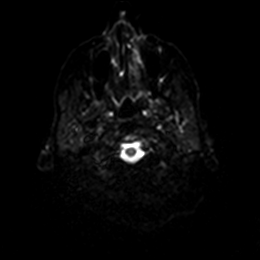
[im 14/96]
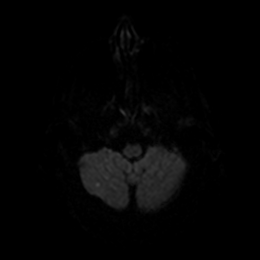
[im 28/96]
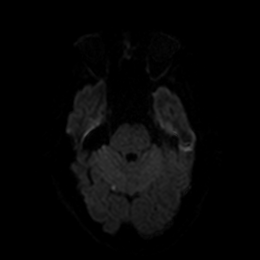
[im 41/96]
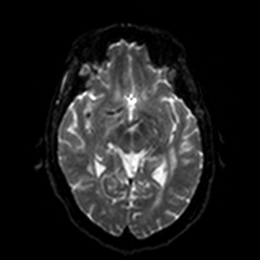
[im 55/96]
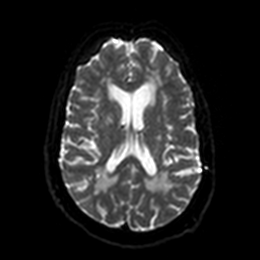
[im 68/96]
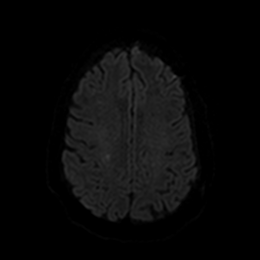
[im 82/96]
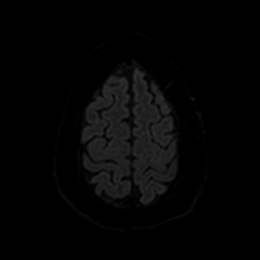
[im 96/96]
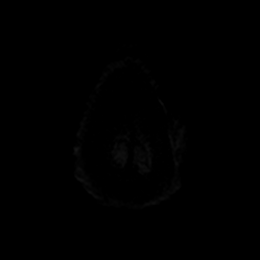

[Series 6: DWI · axial · 3.0mm · 0.88mm/px · z∈[-67,+60]mm · 4 of 47 slices shown (2 of 4)]
[im 1/47]
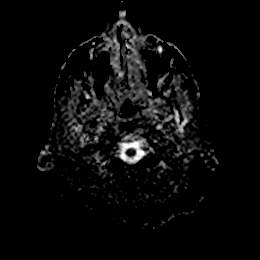
[im 16/47]
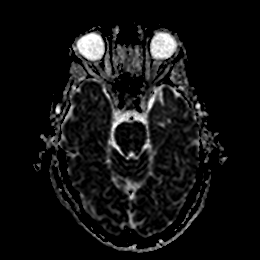
[im 31/47]
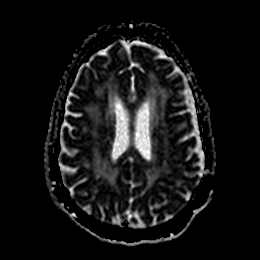
[im 47/47]
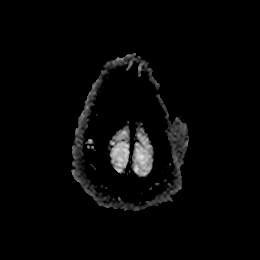

[Series 7: DWI · coronal · 4.0mm · 0.88mm/px · 5 of 72 slices shown (3 of 4)]
[im 1/72]
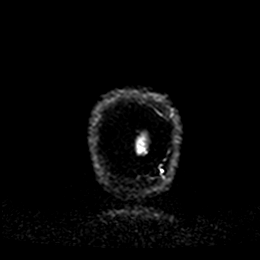
[im 18/72]
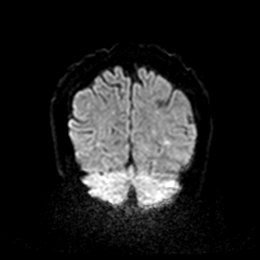
[im 36/72]
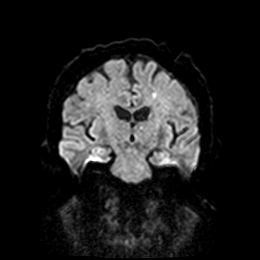
[im 54/72]
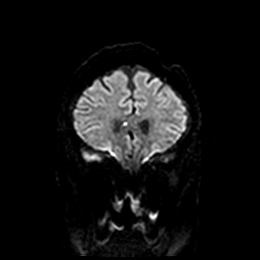
[im 72/72]
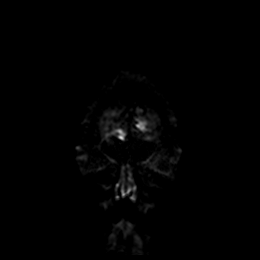

[Series 8: DWI · coronal · 4.0mm · 0.88mm/px · 3 of 36 slices shown (4 of 4)]
[im 1/36]
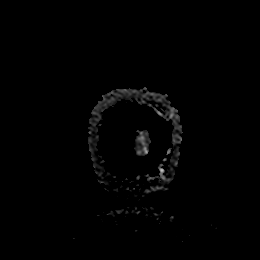
[im 18/36]
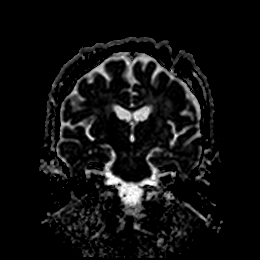
[im 36/36]
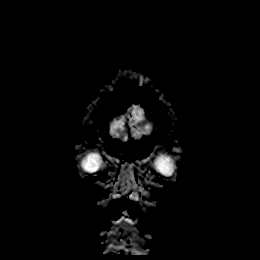

[Series 9: T1 · sagittal · 5.0mm · 0.75mm/px · 2 of 23 slices shown]
[im 1/23]
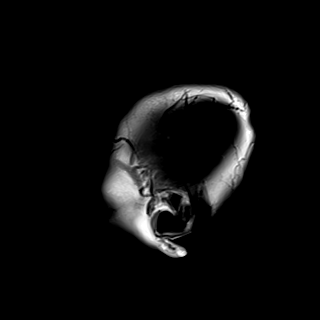
[im 23/23]
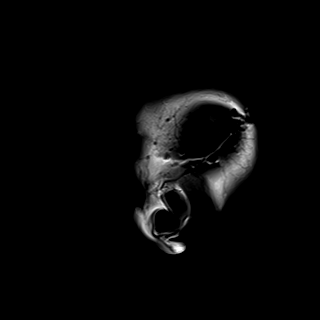

[Series 10: T2 · axial · 5.0mm · 0.72mm/px · z∈[-72,+58]mm · 2 of 25 slices shown (1 of 2)]
[im 1/25]
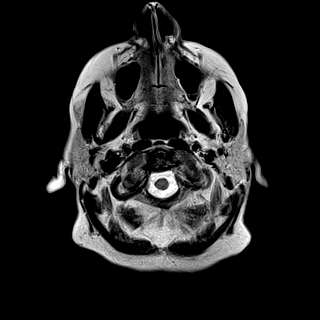
[im 25/25]
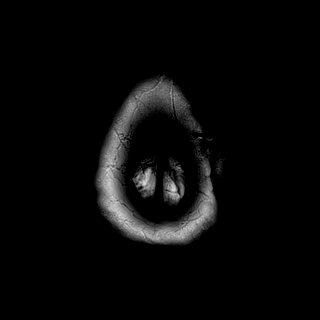

[Series 11: FLAIR · axial · 5.0mm · 0.45mm/px · z∈[-70,+60]mm · 2 of 25 slices shown]
[im 1/25]
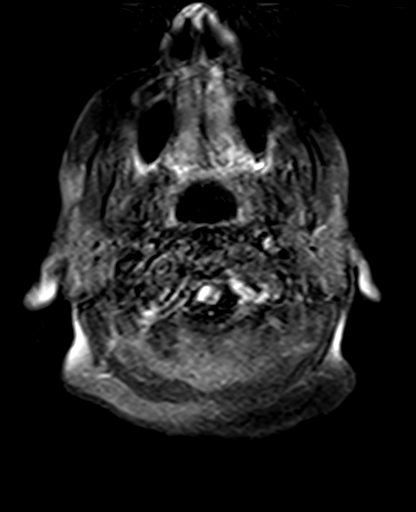
[im 25/25]
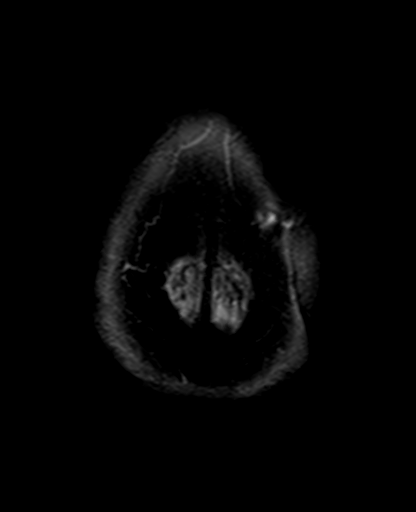

[Series 12: mag_images · axial · 3.0mm · 0.90mm/px · z∈[-81,+79]mm · 4 of 60 slices shown]
[im 1/60]
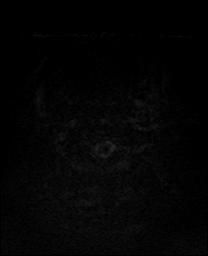
[im 20/60]
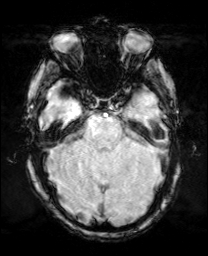
[im 40/60]
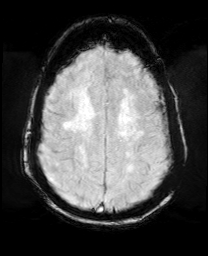
[im 60/60]
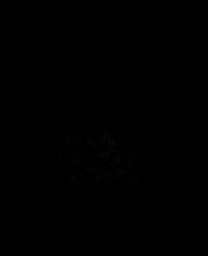

[Series 13: pha_images · axial · 3.0mm · 0.90mm/px · z∈[-75,+79]mm · 4 of 57 slices shown]
[im 1/57]
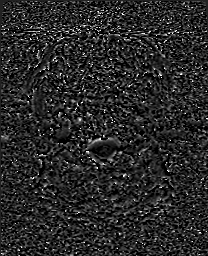
[im 19/57]
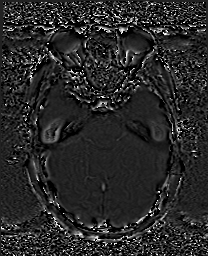
[im 38/57]
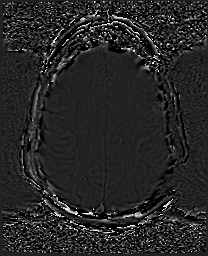
[im 57/57]
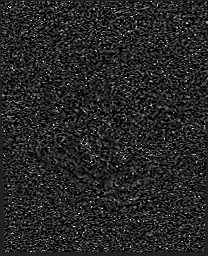

[Series 14: swi_images · axial · 3.0mm · 0.90mm/px · z∈[-81,+79]mm · 4 of 60 slices shown]
[im 1/60]
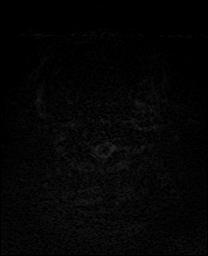
[im 20/60]
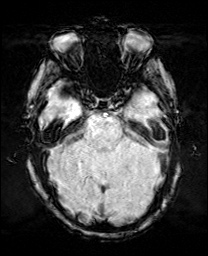
[im 40/60]
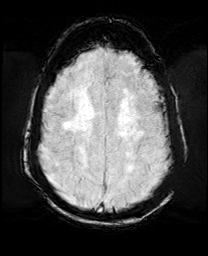
[im 60/60]
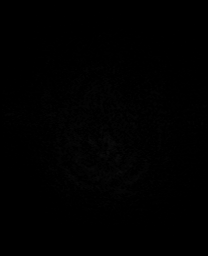

[Series 15: mip_images(sw) · axial · 24.0mm · 0.90mm/px · z∈[-71,+69]mm · 4 of 53 slices shown]
[im 1/53]
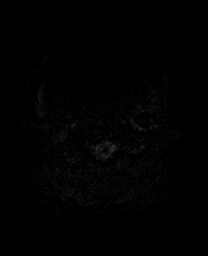
[im 18/53]
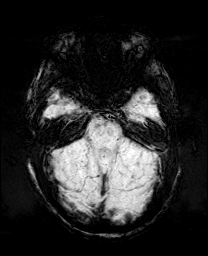
[im 35/53]
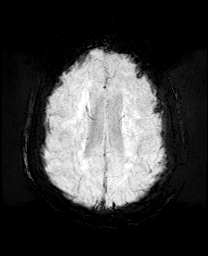
[im 53/53]
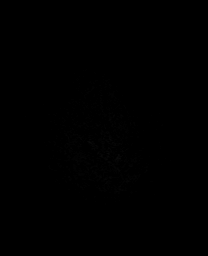

[Series 17: T2 · coronal · 5.0mm · 0.34mm/px · 2 of 29 slices shown (2 of 2)]
[im 1/29]
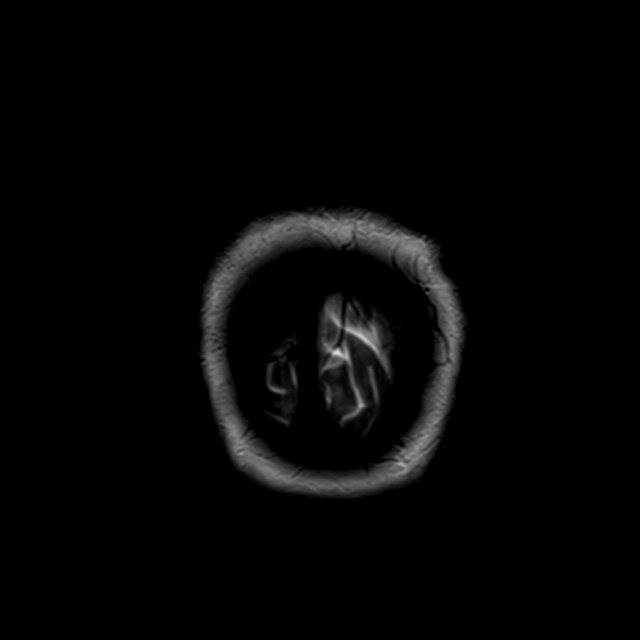
[im 29/29]
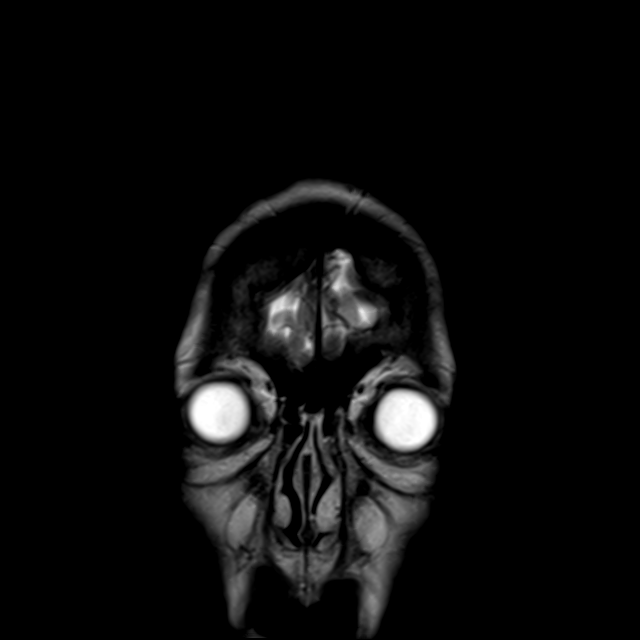

[44 of 48 positions shown; findings below may reference images not displayed]

FINDINGS: Brain: Sequela of prior left frontoparietal craniotomy. Scattered
tiny and small foci of restricted diffusion involving the bilateral
centrum semiovale, periventricular white matter, right frontal
subcortical white matter, corpus callosum genu, right caudate head,
left caudate body/tail, left thalamus, right temporal and occipital
cortices and bilateral cerebellar hemispheres.

Extensive background scattered and confluent T2/FLAIR hyperintense
foci involving the periventricular and deep white matter likely
reflect chronic microvascular ischemic changes. Tiny left cerebellar
focus of hemosiderin deposition.

No midline shift or ventriculomegaly. FLAIR hyperintense subdural
collection measuring up to 4 mm with layering T1 hyperintensity
overlying the left frontal convexity ([DATE]).

Vascular: Normal flow voids. 2 mm left supraclinoid ICA aneurysm
seen on recent CTA is not demonstrated on the current exam.

Skull and upper cervical spine: Normal marrow signal.

Sinuses/Orbits: Sequela of bilateral lens replacement. Mild ethmoid
sinus mucosal thickening.

Other: None.
IMPRESSION: Scattered acute bilateral cerebral and cerebellar infarcts involving
the corpus callosum genu, bilateral basal ganglia and left thalamus,
likely embolic.

Small left frontal convexity subdural hematoma, likely subacute on
chronic.

Extensive chronic microvascular ischemic changes.

These results were called by telephone at the time of interpretation
on [DATE] at [DATE] to provider GRANDA , who verbally
acknowledged these results.

## 2019-11-25 IMAGING — CT CT ANGIO NECK
2 of 11 series · 7 of 33 positions shown · IV contrast (OMNI 350)
Comparison: None.

CLINICAL DATA: Sudden onset headache, left-sided vision loss and
weakness

EXAM:
CT ANGIOGRAPHY HEAD AND NECK
TECHNIQUE: Multidetector CT imaging of the head and neck was performed using
the standard protocol during bolus administration of intravenous
contrast. Multiplanar CT image reconstructions and MIPs were
obtained to evaluate the vascular anatomy. Carotid stenosis
measurements (when applicable) are obtained utilizing NASCET
criteria, using the distal internal carotid diameter as the
denominator.
CONTRAST:  75mL OMNIPAQUE IOHEXOL 350 MG/ML SOLN

[Series 10: cta neck · axial · 0.57mm/px · z∈[-234,-112]mm · 2 of 181 slices shown]
[im 61/181  soft-tissue]
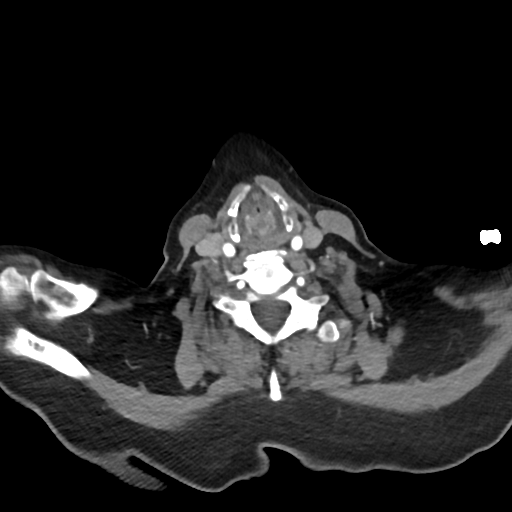
[im 121/181  soft-tissue]
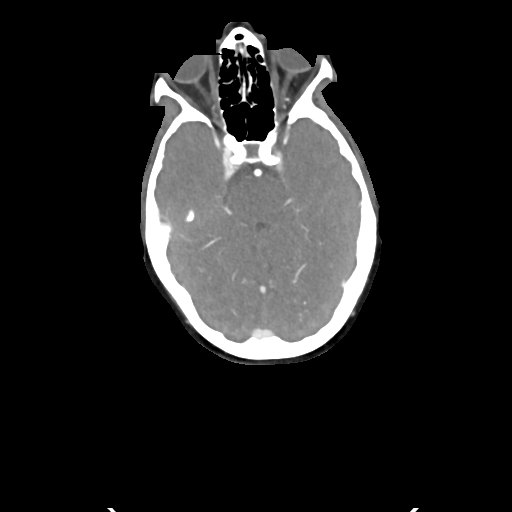

[Series 12: cta neck axial · axial · 0.34mm/px · z∈[-293,-52]mm · 5 of 362 slices shown]
[im 61/362  soft-tissue]
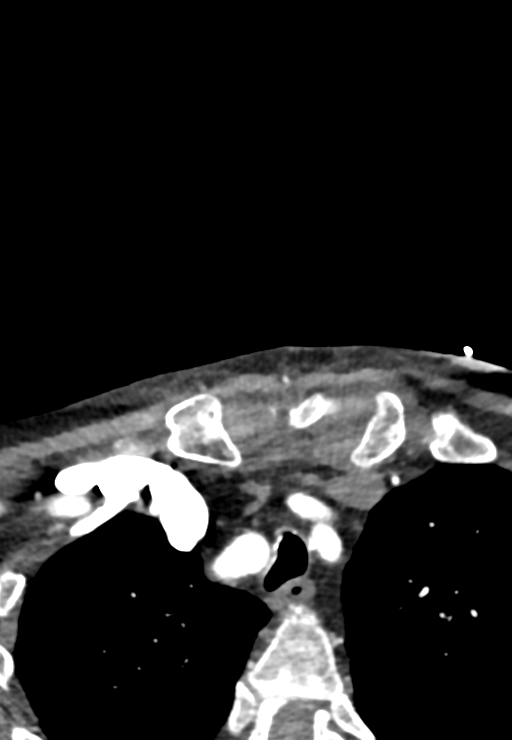
[im 121/362  bone]
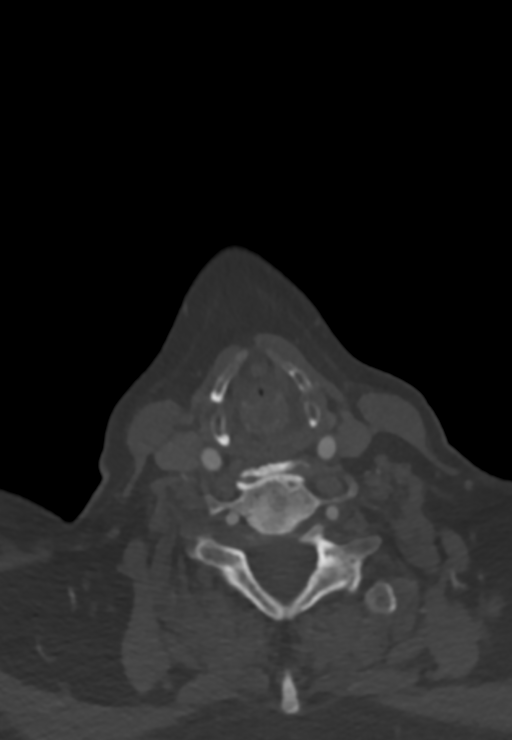
[im 181/362  soft-tissue]
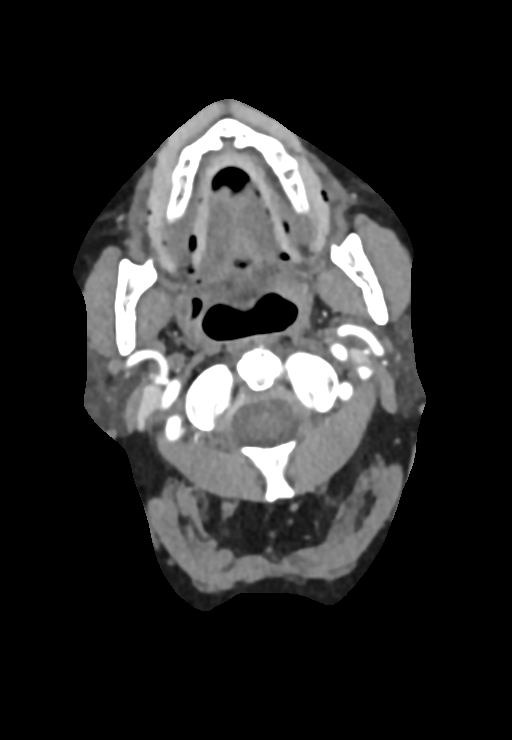
[im 241/362  bone]
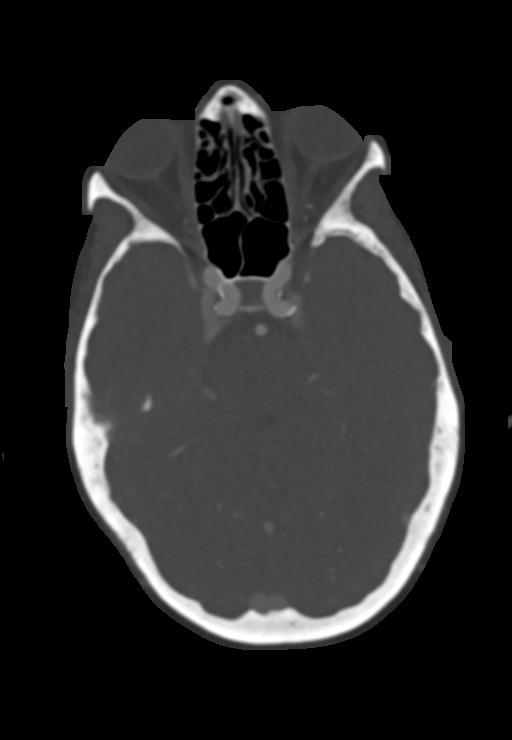
[im 301/362  soft-tissue]
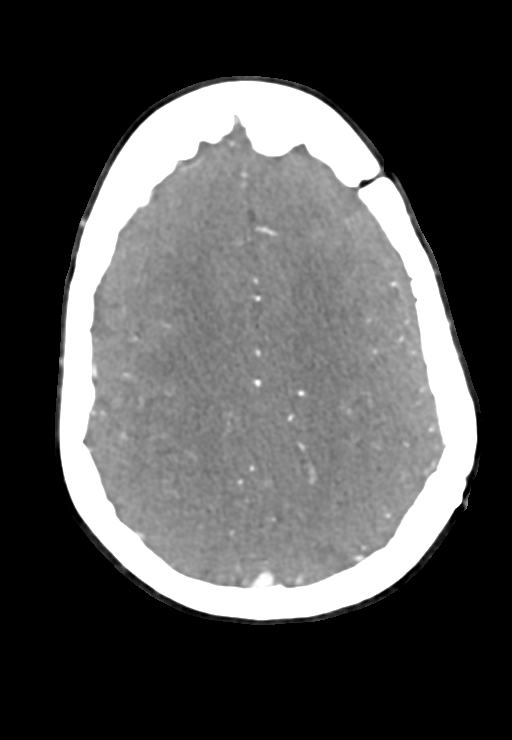

[7 of 33 positions shown; findings below may reference images not displayed]

FINDINGS: CT HEAD

Brain: There is no acute intracranial hemorrhage, mass effect, or
edema. Gray-white differentiation is preserved. Confluent areas of
hypoattenuation in the supratentorial white matter are nonspecific
but may reflect moderate to marked chronic microvascular ischemic
changes. Ventricles and sulci are within normal limits in size and
configuration.

Vascular: No hyperdense vessel. Intracranial atherosclerotic
calcification at the skull base.

Skull: Prior left frontoparietal craniotomy.

Sinuses/Orbits: No acute finding.

Other: None.

Review of the MIP images confirms the above findings

CTA NECK

Aortic arch: Mild calcified plaque along the aortic arch and great
vessel origins, which are patent.

Right carotid system: Patent. Mild calcified plaque along the
proximal ICA without measurable stenosis.

Left carotid system: Patent. Mild primarily calcified plaque along
the proximal ICA without measurable stenosis.

Vertebral arteries: Patent and codominant.

Skeleton: Degenerative changes of the cervical spine. Degenerative
changes of the temporomandibular joints.

Other neck: No mass or adenopathy.

Upper chest: No apical lung mass.

Review of the MIP images confirms the above findings

CTA HEAD

Anterior circulation: Intracranial internal carotid arteries are
patent with mild calcified plaque. There is a 2 x 1.6 mm
inferomedially directed outpouching from the supraclinoid left ICA.
Anterior and middle cerebral arteries are patent.

Posterior circulation: Intracranial vertebral arteries, basilar
artery, and posterior cerebral arteries are patent.

Venous sinuses: As permitted by contrast timing, patent.

Review of the MIP images confirms the above findings
IMPRESSION: No acute intracranial abnormality. Moderate to marked chronic
microvascular ischemic changes.

No large vessel occlusion, hemodynamically significant stenosis, or
evidence of dissection.

Very small aneurysm of the supraclinoid left ICA possibly at the
superior hypophyseal artery origin.

## 2019-11-25 IMAGING — DX DG CHEST 1V PORT
1 series · 1 of 1 positions shown · non-contrast
Comparison: [DATE]

CLINICAL DATA: Weakness

EXAM:
PORTABLE CHEST 1 VIEW

[chest]
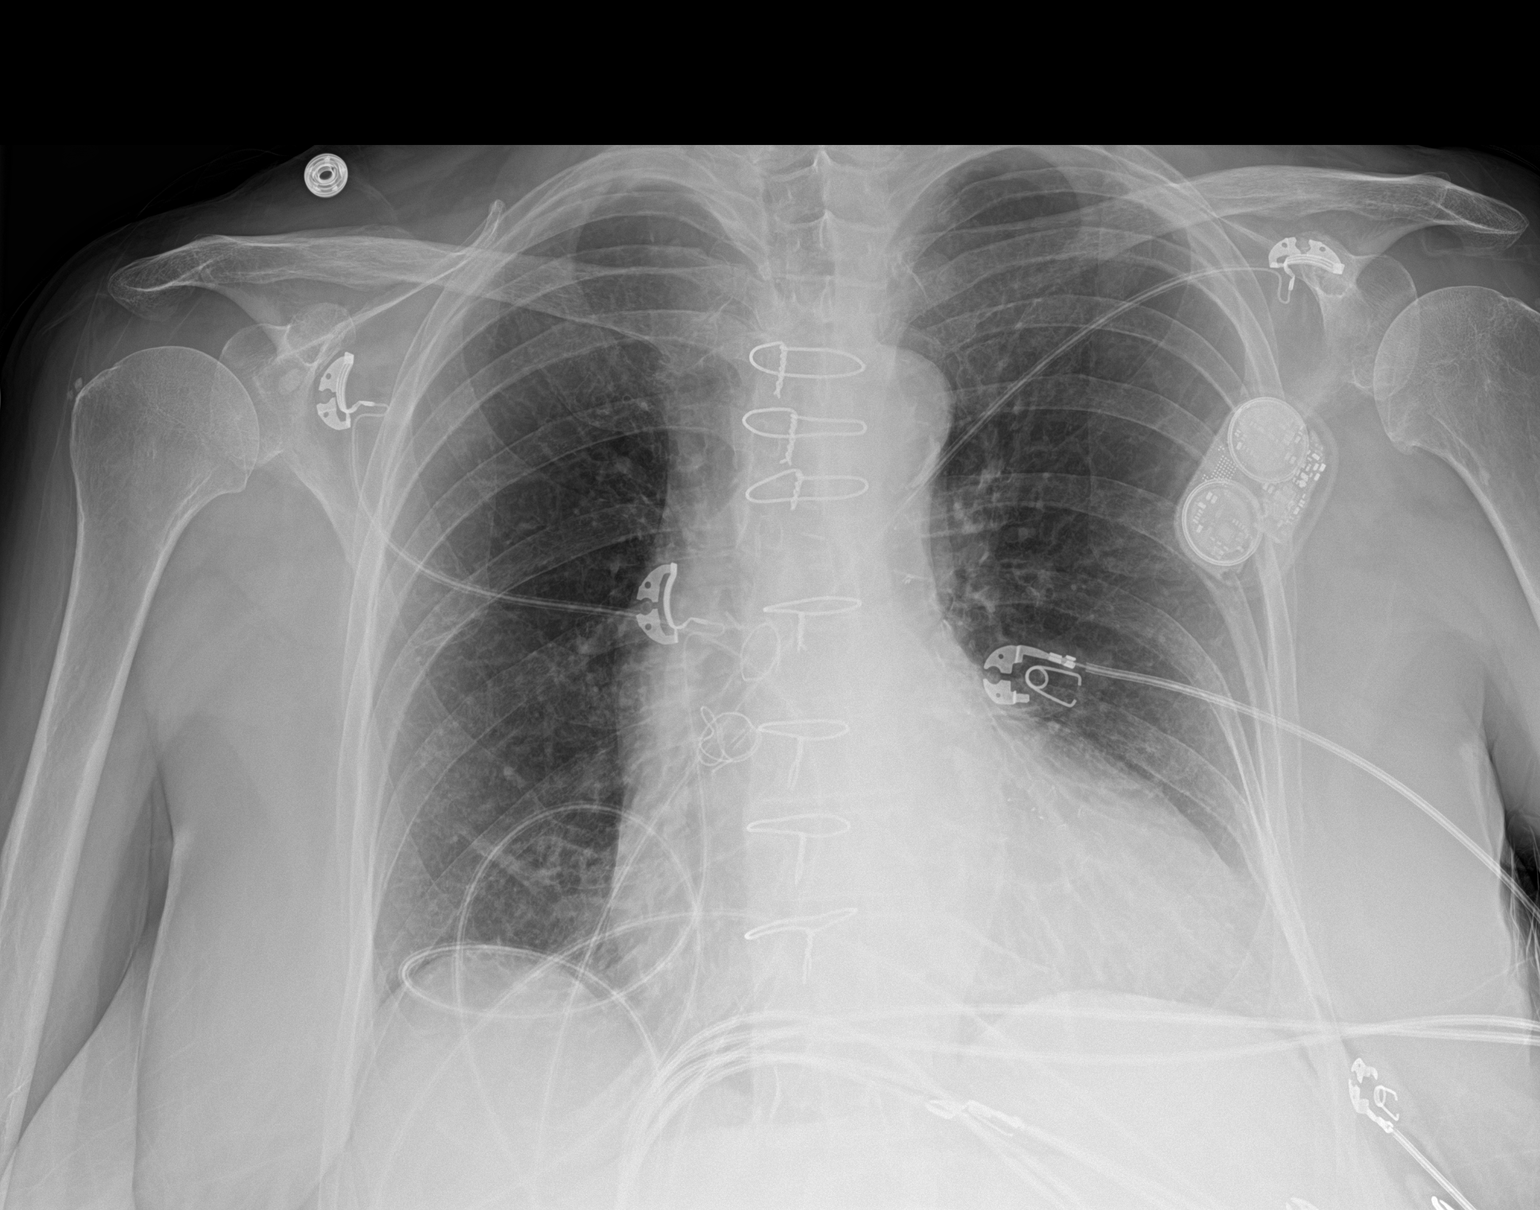

[1 of 1 positions shown; findings below may reference images not displayed]

FINDINGS: Cardiomegaly. CABG. There is no edema, consolidation, effusion, or
pneumothorax. Implantable loop recorder. Artifact from EKG leads.
IMPRESSION: No evidence of acute disease.

## 2019-11-25 MED ORDER — CLOPIDOGREL BISULFATE 300 MG PO TABS
300.0000 mg | ORAL_TABLET | Freq: Once | ORAL | Status: AC
  Administered 2019-11-25: 300 mg via ORAL
  Filled 2019-11-25: qty 1

## 2019-11-25 MED ORDER — ENOXAPARIN SODIUM 40 MG/0.4ML ~~LOC~~ SOLN
40.0000 mg | SUBCUTANEOUS | Status: DC
  Administered 2019-11-25: 17:00:00 40 mg via SUBCUTANEOUS
  Filled 2019-11-25: qty 0.4

## 2019-11-25 MED ORDER — CLOPIDOGREL BISULFATE 75 MG PO TABS
75.0000 mg | ORAL_TABLET | Freq: Every day | ORAL | Status: DC
  Administered 2019-11-26 – 2019-11-30 (×5): 75 mg via ORAL
  Filled 2019-11-25 (×6): qty 1

## 2019-11-25 MED ORDER — ACETAMINOPHEN 650 MG RE SUPP: 650.0000 mg | Freq: Four times a day (QID) | RECTAL | Status: DC | PRN

## 2019-11-25 MED ORDER — PROCHLORPERAZINE EDISYLATE 10 MG/2ML IJ SOLN
5.0000 mg | Freq: Once | INTRAMUSCULAR | Status: AC
  Administered 2019-11-25: 5 mg via INTRAVENOUS
  Filled 2019-11-25: qty 2

## 2019-11-25 MED ORDER — HYDROXYUREA 500 MG PO CAPS
500.0000 mg | ORAL_CAPSULE | Freq: Every day | ORAL | Status: DC
  Administered 2019-11-25 – 2019-11-30 (×6): 500 mg via ORAL
  Filled 2019-11-25 (×6): qty 1

## 2019-11-25 MED ORDER — ATORVASTATIN CALCIUM 80 MG PO TABS
80.0000 mg | ORAL_TABLET | Freq: Every day | ORAL | Status: DC
  Administered 2019-11-25 – 2019-11-30 (×6): 80 mg via ORAL
  Filled 2019-11-25 (×3): qty 1
  Filled 2019-11-25: qty 2
  Filled 2019-11-25 (×2): qty 1

## 2019-11-25 MED ORDER — ASPIRIN EC 81 MG PO TBEC
81.0000 mg | DELAYED_RELEASE_TABLET | Freq: Every day | ORAL | Status: DC
  Administered 2019-11-26 – 2019-11-30 (×5): 81 mg via ORAL
  Filled 2019-11-25 (×5): qty 1

## 2019-11-25 MED ORDER — ACETAMINOPHEN 325 MG PO TABS
650.0000 mg | ORAL_TABLET | Freq: Once | ORAL | Status: AC
  Administered 2019-11-25: 08:00:00 650 mg via ORAL
  Filled 2019-11-25: qty 2

## 2019-11-25 MED ORDER — DOXYCYCLINE HYCLATE 100 MG PO TABS
100.0000 mg | ORAL_TABLET | Freq: Two times a day (BID) | ORAL | Status: AC
  Administered 2019-11-25 – 2019-11-27 (×5): 100 mg via ORAL
  Filled 2019-11-25 (×5): qty 1

## 2019-11-25 MED ORDER — FE FUMARATE-B12-VIT C-FA-IFC PO CAPS
1.0000 | ORAL_CAPSULE | Freq: Two times a day (BID) | ORAL | Status: DC
  Administered 2019-11-25 – 2019-11-30 (×8): 1 via ORAL
  Filled 2019-11-25 (×13): qty 1

## 2019-11-25 MED ORDER — DIPHENHYDRAMINE HCL 50 MG/ML IJ SOLN
12.5000 mg | Freq: Once | INTRAMUSCULAR | Status: AC
  Administered 2019-11-25: 09:00:00 12.5 mg via INTRAVENOUS
  Filled 2019-11-25: qty 1

## 2019-11-25 MED ORDER — POLYETHYLENE GLYCOL 3350 17 G PO PACK: 17.0000 g | PACK | Freq: Every day | ORAL | Status: DC | PRN

## 2019-11-25 MED ORDER — IOHEXOL 350 MG/ML SOLN
75.0000 mL | Freq: Once | INTRAVENOUS | Status: AC | PRN
  Administered 2019-11-25: 75 mL via INTRAVENOUS

## 2019-11-25 MED ORDER — PANTOPRAZOLE SODIUM 40 MG PO TBEC
40.0000 mg | DELAYED_RELEASE_TABLET | Freq: Every day | ORAL | Status: DC
  Administered 2019-11-25 – 2019-11-30 (×6): 40 mg via ORAL
  Filled 2019-11-25 (×7): qty 1

## 2019-11-25 MED ORDER — ACETAMINOPHEN 325 MG PO TABS: 650.0000 mg | ORAL_TABLET | Freq: Four times a day (QID) | ORAL | Status: DC | PRN

## 2019-11-25 NOTE — ED Notes (Signed)
Returned from CT.

## 2019-11-25 NOTE — ED Notes (Signed)
neurology at  bedside

## 2019-11-25 NOTE — ED Notes (Signed)
Attending Mullis notified of pt troponin 257 ng/l.

## 2019-11-25 NOTE — ED Provider Notes (Signed)
[facility] EMERGENCY DEPARTMENT Provider Note   CSN: [id number] Arrival date & time: 11/25/19  0548     History Chief Complaint  Patient presents with  . Weakness    Yolanda Flowers is a 79 y.o. female with a past medical history significant for anxiety, asthma, congestive heart failure, COPD, CAD status post CABG on 11/04/2019, and hypertension who presents to the ED due to generalized weakness that has progressively gotten worse over the past 24 weeks.  Patient states this morning around 2:30 AM she stood up to go the bathroom and she felt like her knees buckled and she became extremely weak.  She also admits to left shoulder pain that radiates down her left arm which she describes as a "jelly sensation" with associated left arm weakness.  Denies changes to speech and facial droop. Also admits to a gradual onset of a frontal, throbbing headache that has progressively gotten worse over the past few hours. Headache started this morning. Admits to painless left visual changes that started after CABG 2 weeks ago. She was seen by ophthalmology who believes a plaque broke off during her surgery and traveled to her eye. Denies any changes to her vision today and notes vision has been stable since ophthalmology visit. Denies fever and chills. Denies chest pain and shortness of breath.    History obtained from patient/daughter at bedside and past medical records. No interpreter used during encounter.     Past Medical History:  Diagnosis Date  . Anxiety   . Arthritis   . Asthma   . Bilateral edema of lower extremity   . Complication of anesthesia    difficulty waking  . Congestive heart failure (CHF) ([place])   . COPD (chronic obstructive pulmonary disease) ([place])   . Coronary artery disease   . Dyspnea on exertion   . GERD (gastroesophageal reflux disease)   . History of melanoma excision    left greast toe 2015  . History of squamous cell carcinoma excision    face--   multiple excisions  . History of subdural hematoma    2008  . Hypertension   . OSA (obstructive sleep apnea)    intolerant  . Thrombocytosis ([place])    takes hydoxyurea--  MONITORED BY DR Hinton Rao [facility])  . VIN III (vulvar intraepithelial neoplasia III)   . Wears dentures    UPPER AND LOWER PARTIAL  . Wears glasses     Patient Active Problem List   Diagnosis Date Noted  . S/P CABG x 4 11/04/2019  . CAD (coronary artery disease) 10/25/2019  . Accelerating angina ([place]) 08/26/2019  . Vitamin D deficiency 08/24/2019  . Combined hyperlipidemia 08/24/2019  . Body mass index 29.0-29.9, adult 08/24/2019  . Vitamin B 12 deficiency 08/24/2019  . Malignant melanoma of great toe ([place]) 08/24/2019  . Pedal edema 08/24/2019  . Skin cancer 08/24/2019  . Squamous cell carcinoma, scalp/neck 08/24/2019  . Ischial bursitis, right 08/24/2019  . Swelling of limb 08/24/2019  . Renal insufficiency 08/24/2019  . Atherosclerosis of arteries 08/24/2019  . Dyspnea 02/09/2019  . Solitary pulmonary nodule on lung CT 02/09/2019  . Gastrointestinal bleeding 06/23/2018  . VIN III (vulvar intraepithelial neoplasia III) 10/09/2015  . Melanoma in situ ([place]) 06/29/2014  . Essential thrombocytosis ([place]) 06/22/2014  . COPD (chronic obstructive pulmonary disease) ([place]) 06/21/2014  . HTN (hypertension) 06/21/2014  . Allergic rhinitis 06/21/2014  . GERD (gastroesophageal reflux disease) 06/21/2014    Past Surgical History:  Procedure Laterality  Date  . ABDOMINAL HYSTERECTOMY  age 24  . CARDIAC CATHETERIZATION  03/172021  . CORONARY ARTERY BYPASS GRAFT N/A 11/04/2019   Procedure: CORONARY ARTERY BYPASS GRAFTING (CABG) x 4, with ENDOSCOPIC HARVESTING OF RIGHT GREATER SAPHENOUS VEIN.;  Surgeon: Gaye Pollack, MD;  Location: [facility] OR;  Service: Open Heart Surgery;  Laterality: N/A;  . INTRAVASCULAR ULTRASOUND/IVUS N/A 11/03/2019   Procedure: Intravascular Ultrasound/IVUS;  Surgeon: Nelva Bush,  MD;  Location: [facility] CV LAB;  Service: Cardiovascular;  Laterality: N/A;  . KNEE ARTHROSCOPY Left 2004  . LEFT HEART CATH AND CORONARY ANGIOGRAPHY N/A 11/03/2019   Procedure: LEFT HEART CATH AND CORONARY ANGIOGRAPHY;  Surgeon: Nelva Bush, MD;  Location: [facility];  Service: Cardiovascular;  Laterality: N/A;  . MELANOMA EXCISION  2015   left great toe  . REPAIR PERONEAL TENDONS ANKLE  2004  . SUBDURAL HEMATOMA EVACUATION VIA CRANIOTOMY  2008      week later  post-op  [facility]  . TEE WITHOUT CARDIOVERSION N/A 11/04/2019   Procedure: TRANSESOPHAGEAL ECHOCARDIOGRAM (TEE);  Surgeon: Gaye Pollack, MD;  Location: [facility];  Service: Open Heart Surgery;  Laterality: N/A;  . VULVECTOMY N/A 10/24/2015   Procedure: WIDE LOCAL EXCISION OF THE VULVA ;  Surgeon: Everitt Amber, MD;  Location: [facility];  Service: Gynecology;  Laterality: N/A;     OB History   No obstetric history on file.     Family History  Problem Relation Age of Onset  . Clotting disorder Mother   . Hypertension Mother   . Heart disease Mother   . Arthritis Mother   . Stroke Father   . Hypertension Father   . Heart disease Father   . Arthritis Father   . Emphysema Father   . Asthma Father   . Kidney failure Father   . Hypertension Sister   . Heart disease Sister   . Arthritis Sister   . Hypertension Brother   . Heart disease Brother   . Arthritis Brother     Social History   Tobacco Use  . Smoking status: Never Smoker  . Smokeless tobacco: Never Used  Substance Use Topics  . Alcohol use: No  . Drug use: No    Home Medications Prior to Admission medications   Medication Sig Start Date End Date Taking? Authorizing Provider  Ascorbic Acid (VITAMIN C) 1000 MG tablet Take 1,000 mg by mouth daily.   Yes [provider]  aspirin EC 325 MG EC tablet Take 1 tablet (325 mg total) by mouth daily. 11/10/19  Yes Gold, Wayne E, PA-C  atorvastatin (LIPITOR) 40 MG tablet Take  1 tablet (40 mg total) by mouth daily at 6 PM. 11/10/19  Yes Gold, Wilder Glade, PA-C  Calcium Carb-Cholecalciferol (CALCIUM 600 + D PO) Take 1 tablet by mouth in the morning and at bedtime.    Yes [provider]  doxycycline (VIBRAMYCIN) 100 MG capsule Take 100 mg by mouth See admin instructions. Take 2 capsules on day 1, then 1 capsule daily.   Yes [provider]  erythromycin ophthalmic ointment Place 1 application into both eyes daily as needed (pain/ irritation).  10/02/18  Yes [provider]  ferrous [id number] C-folic acid (TRINSICON / FOLTRIN) capsule Take 1 capsule by mouth 2 (two) times daily after a meal. 11/10/19  Yes Gold, Wayne E, PA-C  fluticasone (FLONASE) 50 MCG/ACT nasal spray Place 2 sprays into both nostrils at bedtime.  04/24/14  Yes [provider]  hydroxyurea (HYDREA) 500 MG capsule Take 500 mg by mouth daily.    Yes [provider]  lansoprazole (PREVACID) 30 MG capsule Take 30 mg by mouth every morning.    Yes [provider]  Magnesium 250 MG TABS Take 250 mg by mouth at bedtime.    Yes [provider]  metoprolol tartrate (LOPRESSOR) 25 MG tablet Take 1 tablet (25 mg total) by mouth 2 (two) times daily. 11/10/19  Yes Gold, Patrick Jupiter E, PA-C  Multiple Vitamins-Minerals (PRESERVISION AREDS 2) CAPS Take 1 capsule by mouth in the morning and at bedtime.   Yes [provider]  mupirocin ointment (BACTROBAN) 2 % Apply 1 application topically daily as needed (after cancer removal procedures).  06/29/19  Yes [provider]  promethazine (PHENERGAN) 12.5 MG tablet Take 12.5 mg by mouth 2 (two) times daily as needed for nausea or vomiting.   Yes [provider]  traMADol (ULTRAM) 50 MG tablet Take 50 mg by mouth every 6 (six) hours as needed for moderate pain. Up to 7 days 11/22/19  Yes [provider]  Vitamin D, Ergocalciferol, (DRISDOL) 1.25 MG (50000 UT) CAPS capsule Take 50,000 Units  by mouth every 14 (fourteen) days. 06/01/19  Yes [provider]  ammonium lactate (LAC-HYDRIN) 12 % lotion Apply 1 application topically daily as needed (skin spots).  06/29/19   [provider]  ciprofloxacin (CIPRO) 250 MG tablet Take 1 tablet (250 mg total) by mouth 2 (two) times daily for 10 days. Patient not taking: Reported on 11/25/2019 11/15/19 11/25/19  Jadene Pierini E, PA-C  fluconazole (DIFLUCAN) 150 MG tablet Take 1 tablet by mouth once. 11/24/19   [provider]    Allergies    Cephalexin, Ciprofloxacin, and Penicillins  Review of Systems   Review of Systems  Constitutional: Negative for chills and fever.  Respiratory: Negative for cough and shortness of breath.   Cardiovascular: Negative for chest pain and leg swelling.  Gastrointestinal: Negative for abdominal pain, diarrhea, nausea and vomiting.  Musculoskeletal: Positive for arthralgias (left shoulder/arm).  Neurological: Positive for weakness and headaches.  All other systems reviewed and are negative.   Physical Exam Updated Vital Signs BP (!) 146/92   Pulse 77   Temp 98.2 F (36.8 C) (Oral)   Resp (!) 24   Ht 5\' 6"  (1.676 m)   Wt 73.5 kg   SpO2 97%   BMI 26.15 kg/m   Physical Exam Vitals and nursing note reviewed.  Constitutional:      General: She is not in acute distress.    Appearance: She is not ill-appearing.  HENT:     Head: Normocephalic.  Eyes:     Pupils: Pupils are equal, round, and reactive to light.  Cardiovascular:     Rate and Rhythm: Normal rate and regular rhythm.     Pulses: Normal pulses.     Heart sounds: Normal heart sounds. No murmur. No friction rub. No gallop.   Pulmonary:     Effort: Pulmonary effort is normal.     Breath sounds: Normal breath sounds.  Abdominal:     General: Abdomen is flat. There is no distension.     Palpations: Abdomen is soft.     Tenderness: There is no abdominal tenderness. There is no guarding or rebound.  Musculoskeletal:      Cervical back: Neck supple.     Comments: Able to move all 4 extremities without difficulty. Reproducible left scapular pain. No thoracic or lumbar  midline tenderness.   Skin:    General: Skin is warm and dry.     Findings: Bruising present.     Comments: Ecchymosis throughout right lower extremity.  Neurological:     General: No focal deficit present.     Mental Status: She is alert.     Comments: Speech is clear, able to follow commands CN III-XII intact Normal strength in upper and lower extremities bilaterally including dorsiflexion and plantar flexion, strong and equal grip strength Sensation grossly intact throughout Moves extremities without ataxia, coordination intact No pronator drift  Psychiatric:        Mood and Affect: Mood normal.        Behavior: Behavior normal.     ED Results / Procedures / Treatments   Labs (all labs ordered are listed, but only abnormal results are displayed) Labs Reviewed  CBC WITH DIFFERENTIAL/PLATELET - Abnormal; Notable for the following components:      Result Value   WBC 18.3 (*)    RBC 3.60 (*)    Hemoglobin 11.6 (*)    MCV 104.4 (*)    RDW 21.5 (*)    Neutro Abs 14.5 (*)    Monocytes Absolute 1.1 (*)    Basophils Absolute 0.7 (*)    Abs Immature Granulocytes 0.50 (*)    All other components within normal limits  COMPREHENSIVE METABOLIC PANEL - Abnormal; Notable for the following components:   Glucose, Bld 103 (*)    Creatinine, Ser 1.15 (*)    GFR calc non Af Amer 45 (*)    GFR calc Af Amer 52 (*)    All other components within normal limits  TROPONIN I (HIGH SENSITIVITY) - Abnormal; Notable for the following components:   Troponin I (High Sensitivity) 24 (*)    All other components within normal limits  TROPONIN I (HIGH SENSITIVITY) - Abnormal; Notable for the following components:   Troponin I (High Sensitivity) 139 (*)    All other components within normal limits  SARS CORONAVIRUS 2 (TAT 6-24 HRS)  VITAMIN B12  FOLATE  RBC  TROPONIN I (HIGH SENSITIVITY)    EKG EKG Interpretation  Date/Time:  Thursday November 25 2019 05:55:06 EDT Ventricular Rate:  70 PR Interval:    QRS Duration: 75 QT Interval:  379 QTC Calculation: 409 R Axis:   11 Text Interpretation: Sinus rhythm Atrial premature complexes Probable left atrial enlargement Borderline repolarization abnormality Confirmed by Madalyn Rob [phone]) on 11/25/2019 8:14:37 AM   Radiology CT Angio Head W or Wo Contrast  Result Date: 11/25/2019 CLINICAL DATA:  Sudden onset headache, left-sided vision loss and weakness EXAM: CT ANGIOGRAPHY HEAD AND NECK TECHNIQUE: Multidetector CT imaging of the head and neck was performed using the standard protocol during bolus administration of intravenous contrast. Multiplanar CT image reconstructions and MIPs were obtained to evaluate the vascular anatomy. Carotid stenosis measurements (when applicable) are obtained utilizing NASCET criteria, using the distal internal carotid diameter as the denominator. CONTRAST:  9mL OMNIPAQUE IOHEXOL 350 MG/ML SOLN COMPARISON:  None. FINDINGS: CT HEAD Brain: There is no acute intracranial hemorrhage, mass effect, or edema. Gray-white differentiation is preserved. Confluent areas of hypoattenuation in the supratentorial white matter are nonspecific but may reflect moderate to marked chronic microvascular ischemic changes. Ventricles and sulci are within normal limits in size and configuration. Vascular: No hyperdense vessel. Intracranial atherosclerotic calcification at the skull base. Skull: Prior left frontoparietal craniotomy. Sinuses/Orbits: No acute finding. Other: None. Review of the MIP images confirms the above findings CTA  NECK Aortic arch: Mild calcified plaque along the aortic arch and great vessel origins, which are patent. Right carotid system: Patent. Mild calcified plaque along the proximal ICA without measurable stenosis. Left carotid system: Patent. Mild primarily calcified  plaque along the proximal ICA without measurable stenosis. Vertebral arteries: Patent and codominant. Skeleton: Degenerative changes of the cervical spine. Degenerative changes of the temporomandibular joints. Other neck: No mass or adenopathy. Upper chest: No apical lung mass. Review of the MIP images confirms the above findings CTA HEAD Anterior circulation: Intracranial internal carotid arteries are patent with mild calcified plaque. There is a 2 x 1.6 mm inferomedially directed outpouching from the supraclinoid left ICA. Anterior and middle cerebral arteries are patent. Posterior circulation: Intracranial vertebral arteries, basilar artery, and posterior cerebral arteries are patent. Venous sinuses: As permitted by contrast timing, patent. Review of the MIP images confirms the above findings IMPRESSION: No acute intracranial abnormality. Moderate to marked chronic microvascular ischemic changes. No large vessel occlusion, hemodynamically significant stenosis, or evidence of dissection. 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 Electronically Signed   By: Macy Mis M.D.   On: 11/25/2019 08:54   CT Angio Neck W and/or Wo Contrast  Result Date: 11/25/2019 CLINICAL DATA:  Sudden onset headache, left-sided vision loss and weakness EXAM: CT ANGIOGRAPHY HEAD AND NECK TECHNIQUE: Multidetector CT imaging of the head and neck was performed using the standard protocol during bolus administration of intravenous contrast. Multiplanar CT image reconstructions and MIPs were obtained to evaluate the vascular anatomy. Carotid stenosis measurements (when applicable) are obtained utilizing NASCET criteria, using the distal internal carotid diameter as the denominator. CONTRAST:  81mL OMNIPAQUE IOHEXOL 350 MG/ML SOLN COMPARISON:  None. FINDINGS: CT HEAD Brain: There is no acute intracranial hemorrhage, mass effect, or edema. Gray-white differentiation is preserved.  Confluent areas of hypoattenuation in the supratentorial white matter are nonspecific but may reflect moderate to marked chronic microvascular ischemic changes. Ventricles and sulci are within normal limits in size and configuration. Vascular: No hyperdense vessel. Intracranial atherosclerotic calcification at the skull base. Skull: Prior left frontoparietal craniotomy. Sinuses/Orbits: No acute finding. Other: None. Review of the MIP images confirms the above findings CTA NECK Aortic arch: Mild calcified plaque along the aortic arch and great vessel origins, which are patent. Right carotid system: Patent. Mild calcified plaque along the proximal ICA without measurable stenosis. Left carotid system: Patent. Mild primarily calcified plaque along the proximal ICA without measurable stenosis. Vertebral arteries: Patent and codominant. Skeleton: Degenerative changes of the cervical spine. Degenerative changes of the temporomandibular joints. Other neck: No mass or adenopathy. Upper chest: No apical lung mass. Review of the MIP images confirms the above findings CTA HEAD Anterior circulation: Intracranial internal carotid arteries are patent with mild calcified plaque. There is a 2 x 1.6 mm inferomedially directed outpouching from the supraclinoid left ICA. Anterior and middle cerebral arteries are patent. Posterior circulation: Intracranial vertebral arteries, basilar artery, and posterior cerebral arteries are patent. Venous sinuses: As permitted by contrast timing, patent. Review of the MIP images confirms the above findings IMPRESSION: No acute intracranial abnormality. Moderate to marked chronic microvascular ischemic changes. No large vessel occlusion, hemodynamically significant stenosis, or evidence of dissection. 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 Electronically Signed   By: Macy Mis M.D.   On: 11/25/2019 08:54   MR BRAIN WO  CONTRAST  Result Date: 11/25/2019 CLINICAL DATA:  Focal neural  deficit, greater than 6 hours, stroke suspected. Progressive weakness for 2 weeks. Headache and left arm weakness. EXAM: MRI HEAD WITHOUT CONTRAST TECHNIQUE: Multiplanar, multiecho pulse sequences of the brain and surrounding structures were obtained without intravenous contrast. COMPARISON:  11/25/2019 CTA  head and neck.  06/27/2012 MRI head. FINDINGS: Brain: Sequela of prior left frontoparietal craniotomy. Scattered tiny and small foci of restricted diffusion involving the bilateral centrum semiovale, periventricular white matter, right frontal subcortical white matter, corpus callosum genu, right caudate head, left caudate body/tail, left thalamus, right temporal and occipital cortices and bilateral cerebellar hemispheres. Extensive background scattered and confluent T2/FLAIR hyperintense foci involving the periventricular and deep white matter likely reflect chronic microvascular ischemic changes. Tiny left cerebellar focus of hemosiderin deposition. No midline shift or ventriculomegaly. FLAIR hyperintense subdural collection measuring up to 4 mm with layering T1 hyperintensity overlying the left frontal convexity (1:17). Vascular: Normal flow voids. 2 mm left supraclinoid ICA aneurysm seen on recent CTA is not demonstrated on the current exam. Skull and upper cervical spine: Normal marrow signal. Sinuses/Orbits: Sequela of bilateral lens replacement. Mild ethmoid sinus mucosal thickening. Other: None. IMPRESSION: Scattered acute bilateral cerebral and cerebellar infarcts involving the corpus callosum genu, bilateral basal ganglia and left thalamus, likely embolic. Small left frontal convexity subdural hematoma, likely subacute on chronic. Extensive chronic microvascular ischemic changes. These results were called by telephone at the time of interpretation on 11/25/2019 at 1:20 pm to provider [facility] , who verbally acknowledged these  results. Electronically Signed   By: Primitivo Gauze M.D.   On: 11/25/2019 13:22   DG Chest Portable 1 View  Result Date: 11/25/2019 CLINICAL DATA:  Weakness EXAM: PORTABLE CHEST 1 VIEW COMPARISON:  11/07/2019 FINDINGS: Cardiomegaly. CABG. There is no edema, consolidation, effusion, or pneumothorax. Implantable loop recorder. Artifact from EKG leads. IMPRESSION: No evidence of acute disease. Electronically Signed   By: Monte Fantasia M.D.   On: 11/25/2019 07:43    Procedures Procedures (including critical care time)  Medications Ordered in ED Medications  acetaminophen (TYLENOL) tablet 650 mg (650 mg Oral Given 11/25/19 0742)  iohexol (OMNIPAQUE) 350 MG/ML injection 75 mL (75 mLs Intravenous Contrast Given 11/25/19 0823)  diphenhydrAMINE (BENADRYL) injection 12.5 mg (12.5 mg Intravenous Given 11/25/19 0926)  prochlorperazine (COMPAZINE) injection 5 mg (5 mg Intravenous Given 11/25/19 [id number])    ED Course  I have reviewed the triage vital signs and the nursing notes.  Pertinent labs & imaging results that were available during my care of the patient were reviewed by me and considered in my medical decision making (see chart for details).  Clinical Course as of Nov 25 1431  Thu Nov 25, 2019  0809 Troponin I (High Sensitivity)(!): 24 [CA]  0809 WBC(!): 18.3 [CA]  0809 Creatinine(!): 1.15 [CA]  0900 Troponin I (High Sensitivity)(!!): 139 [CA]  1321 Spoke to Dr. Lorraine Lax who recommends echocardiogram. He will evaluate patient at bedside. Will consult hospitalist for medication admission.     [CA]    Clinical Course User Index [CA] Karie Kirks   MDM Rules/Calculators/A&P                     79 year old female presents to the ED via EMS due to weakness, left arm/shoulder pain, and a frontal headache that started this morning around 230/3 AM. She recently underwent CABG on 11/04/19.  Stable vitals.  Patient is afebrile, not tachycardic or hypoxic.  Patient no acute distress and  nontoxic-appearing.  Physical exam reassuring.  Normal neurological exam.  No appreciated unilateral weakness.  Reproducible left scapular pain.  No thoracic or lumbar midline tenderness. Will obtain troponin to rule out atypical presentation of ACS given left arm/shoulder pain in addition to CXR and EKG. Will also order CBC and CMP to rule out anemia and electrolyte derangements. Discussed case with Dr. Roslynn Amble who evaluated patient at bedside and agrees with assessment and plan.   CBC significant for leukocytosis at 18.3 which is decreased from yesterday. Patient is currently being treated for cellulitis with caridiothoracic surgery. CMP reassuring with mild AKI with creatinine at 1.15. Initial troponin elevated at 24. Will obtain delta to rule out ACS. CXR personally reviewed which is negative for pneumonia, PTX, or widened mediastinum.  EKG personally reviewed which demonstrates normal sinus rhythm with no signs of acute ischemia. CTA personally reviewed which demonstrates: IMPRESSION:  No acute intracranial abnormality. Moderate to marked chronic  microvascular ischemic changes.    No large vessel occlusion, hemodynamically significant stenosis, or  evidence of dissection.    Very small aneurysm of the supraclinoid left ICA possibly at the  superior hypophyseal artery origin.     9:19 AM attempted to ambulate patient at bedside with RN.  Patient unable to ambulate.  Patient leaning to right side.  Spoke to Dr. Lorraine Lax with neurology. Will obtain MRI of brain to rule out stroke. Spoke to Dr. Renaldo Fiddler, patient's ophthalmologist, who confirms that patient had a central retinal artery occlusion.  He notes he was able to visualize plaque in the artery.  Second troponin elevated at 139.  Discussed case with cardiology who agrees to evaluate patient.   MRI personally reviewed which demonstrates: IMPRESSION:  Scattered acute bilateral cerebral and cerebellar infarcts involving  the corpus callosum  genu, bilateral basal ganglia and left thalamus,  likely embolic.    Small left frontal convexity subdural hematoma, likely subacute on  chronic.    Extensive chronic microvascular ischemic changes.   Discussed case with Dr. Lorraine Lax who agrees to evaluate patient at bedside. Spoke to Dr. Tarry Kos who agrees to admit patient for further treatment. COVID test pending.   Final Clinical Impression(s) / ED Diagnoses Final diagnoses:  Cerebrovascular accident (CVA), unspecified mechanism [facility])    Rx / DC Orders ED Discharge Orders    None       Karie Kirks 11/25/19 1436    Lucrezia Starch, MD 11/29/19 [phone]

## 2019-11-25 NOTE — Progress Notes (Signed)
  Echocardiogram 2D Echocardiogram limited has been performed.  Darlina Sicilian M 11/25/2019, 2:39 PM

## 2019-11-25 NOTE — ED Notes (Signed)
Cardiology at bedside.

## 2019-11-25 NOTE — ED Notes (Signed)
PA in room and took off the device from left chest. MRI called and made aware that the device was removed.

## 2019-11-25 NOTE — Telephone Encounter (Signed)
-----   Message from Jenean Lindau, MD sent at 11/25/2019  8:18 AM EDT ----- Please let the patient know about this lab work.  I am a little concerned because of the infection pocket in her leg at the site of graft harvest.  Please call our surgical colleagues and let them address this ASAP.  Thank you copy primary care. Jenean Lindau, MD 11/25/2019 8:18 AM

## 2019-11-25 NOTE — ED Notes (Signed)
Attempted to ambulate pt. Per PA and pt. Stated she was unable to feel her legs while standing however when getting back in bed she stated she could feel them.

## 2019-11-25 NOTE — Telephone Encounter (Signed)
8:40 called [facility]'s office and left a detailed message regarding Yolanda Flowers's labs. Labs have been forwarded to the office.  [phone] Called and left a message for Yolanda Flowers to call the office back regarding her labs.

## 2019-11-25 NOTE — ED Notes (Signed)
EDP at bedside  

## 2019-11-25 NOTE — ED Notes (Signed)
Off floor to MRI

## 2019-11-25 NOTE — ED Notes (Signed)
Off floor to CT 

## 2019-11-25 NOTE — Progress Notes (Signed)
I evaluated this patient and discussed management plan with the resident. I will cosign H&P once it is completed by the resident.

## 2019-11-25 NOTE — Consult Note (Signed)
Consultation Reason for Consult: Stroke Referring Physician: Lucrezia Starch, MD   CC: Bilateral leg weakness and left arm weakness  History is obtained from: Patient  HPI: Yolanda Flowers is a 79 y.o. female with history of thrombocytosis, hypertension, subdural hematoma in 2008, CAD, COPD. Patient had a coronary artery bypass graft on 11/04/2019. Patient went to sleep at approximately 2045 on 11/25/2019 feeling normal. Patient woke up at approximately 2 AM to go to the bathroom and noted that both legs felt very weak and wobbly and she could not stand. She also noted that her left arm was extremely weak. For that reason patient came to the hospital. Patient obtained MRI which showed bilateral scattered emboli mostly likely embolic.  Patient is currently on aspirin 325 mg daily.   ED course   CT head shows-no acute intracranial abnormality. Moderate to marked chronic microvascular ischemic changes  CTA of head and neck-no large vessel occlusion, chemically significant stenosis, or evidence of dissection. Very small aneurysm of the supraclinoid left ICA possible at the supra hypophyseal artery origin  MRI brain-scattered acute bilateral cerebral and cerebellar infarcts involving the corpus callosum genu, bilateral basal ganglia and left thalamus, likely embolic. Small left frontal convexity subdural hematoma, likely subacute on chronic  Chart review no prior neurological charts  LKW: 2045 on 11/24/2019 tpa given?: no, out of window Premorbid modified Rankin scale (mRS): 0 NIH stroke score: 3   Past Medical History:  Diagnosis Date  . Anxiety   . Arthritis   . Asthma   . Bilateral edema of lower extremity   . Complication of anesthesia    difficulty waking  . Congestive heart failure (CHF) ([place])   . COPD (chronic obstructive pulmonary disease) ([place])   . Coronary artery disease   . Dyspnea on exertion   . GERD (gastroesophageal reflux disease)   . History of melanoma excision     left greast toe 2015  . History of squamous cell carcinoma excision    face--  multiple excisions  . History of subdural hematoma    2008  . Hypertension   . OSA (obstructive sleep apnea)    intolerant  . Thrombocytosis ([place])    takes hydoxyurea--  MONITORED BY DR Hinton Rao [facility])  . VIN III (vulvar intraepithelial neoplasia III)   . Wears dentures    UPPER AND LOWER PARTIAL  . Wears glasses     Family History  Problem Relation Age of Onset  . Clotting disorder Mother   . Hypertension Mother   . Heart disease Mother   . Arthritis Mother   . Stroke Father   . Hypertension Father   . Heart disease Father   . Arthritis Father   . Emphysema Father   . Asthma Father   . Kidney failure Father   . Hypertension Sister   . Heart disease Sister   . Arthritis Sister   . Hypertension Brother   . Heart disease Brother   . Arthritis Brother    Social History:   reports that she has never smoked. She has never used smokeless tobacco. She reports that she does not drink alcohol or use drugs.  Medications No current facility-administered medications for this encounter.  Current Outpatient Medications:  .  Ascorbic Acid (VITAMIN C) 1000 MG tablet, Take 1,000 mg by mouth daily., Disp: , Rfl:  .  aspirin EC 325 MG EC tablet, Take 1 tablet (325 mg total) by mouth daily., Disp: , Rfl:  .  atorvastatin (  LIPITOR) 40 MG tablet, Take 1 tablet (40 mg total) by mouth daily at 6 PM., Disp: 30 tablet, Rfl: 1 .  Calcium Carb-Cholecalciferol (CALCIUM 600 + D PO), Take 1 tablet by mouth in the morning and at bedtime. , Disp: , Rfl:  .  doxycycline (VIBRAMYCIN) 100 MG capsule, Take 100 mg by mouth See admin instructions. Take 2 capsules on day 1, then 1 capsule daily., Disp: , Rfl:  .  erythromycin ophthalmic ointment, Place 1 application into both eyes daily as needed (pain/ irritation). , Disp: , Rfl:  .  ferrous [id number] C-folic acid (TRINSICON / FOLTRIN) capsule,  Take 1 capsule by mouth 2 (two) times daily after a meal., Disp: 60 capsule, Rfl: 1 .  fluticasone (FLONASE) 50 MCG/ACT nasal spray, Place 2 sprays into both nostrils at bedtime. , Disp: , Rfl:  .  hydroxyurea (HYDREA) 500 MG capsule, Take 500 mg by mouth daily. , Disp: , Rfl:  .  lansoprazole (PREVACID) 30 MG capsule, Take 30 mg by mouth every morning. , Disp: , Rfl:  .  Magnesium 250 MG TABS, Take 250 mg by mouth at bedtime. , Disp: , Rfl:  .  metoprolol tartrate (LOPRESSOR) 25 MG tablet, Take 1 tablet (25 mg total) by mouth 2 (two) times daily., Disp: 30 tablet, Rfl: 1 .  Multiple Vitamins-Minerals (PRESERVISION AREDS 2) CAPS, Take 1 capsule by mouth in the morning and at bedtime., Disp: , Rfl:  .  mupirocin ointment (BACTROBAN) 2 %, Apply 1 application topically daily as needed (after cancer removal procedures). , Disp: , Rfl:  .  promethazine (PHENERGAN) 12.5 MG tablet, Take 12.5 mg by mouth 2 (two) times daily as needed for nausea or vomiting., Disp: , Rfl:  .  traMADol (ULTRAM) 50 MG tablet, Take 50 mg by mouth every 6 (six) hours as needed for moderate pain. Up to 7 days, Disp: , Rfl:  .  Vitamin D, Ergocalciferol, (DRISDOL) 1.25 MG (50000 UT) CAPS capsule, Take 50,000 Units by mouth every 14 (fourteen) days., Disp: , Rfl:  .  ammonium lactate (LAC-HYDRIN) 12 % lotion, Apply 1 application topically daily as needed (skin spots). , Disp: , Rfl:  .  ciprofloxacin (CIPRO) 250 MG tablet, Take 1 tablet (250 mg total) by mouth 2 (two) times daily for 10 days. (Patient not taking: Reported on 11/25/2019), Disp: 20 tablet, Rfl: 0 .  fluconazole (DIFLUCAN) 150 MG tablet, Take 1 tablet by mouth once., Disp: , Rfl:   ROS:  General ROS: negative for - chills, fatigue, fever, night sweats, weight gain or weight loss Psychological ROS: negative for - behavioral disorder, hallucinations, memory difficulties, mood swings or suicidal ideation Ophthalmic ROS: negative for - blurry vision, double vision, eye  pain or loss of vision ENT ROS: negative for - epistaxis, nasal discharge, oral lesions, sore throat, tinnitus or vertigo Allergy and Immunology ROS: negative for - hives or itchy/watery eyes Hematological and Lymphatic ROS: negative for - bleeding problems, bruising or swollen lymph nodes Endocrine ROS: negative for - galactorrhea, hair pattern changes, polydipsia/polyuria or temperature intolerance Respiratory ROS: negative for - cough, hemoptysis, shortness of breath or wheezing Cardiovascular ROS: negative for - chest pain, dyspnea on exertion, edema or irregular heartbeat Gastrointestinal ROS: negative for - abdominal pain, diarrhea, hematemesis, nausea/vomiting or stool incontinence Genito-Urinary ROS: negative for - dysuria, hematuria, incontinence or urinary frequency/urgency Musculoskeletal ROS: Positive for - j muscular weakness Neurological ROS: as noted in HPI Dermatological ROS: Positive for notable bruises on her legs and arms  Exam: Current vital signs: BP 139/90   Pulse 74   Temp 98.2 F (36.8 C) (Oral)   Resp (!) 24   Ht 5\' 6"  (1.676 m)   Wt 73.5 kg   SpO2 95%   BMI 26.15 kg/m  Vital signs in last 24 hours: Temp:  [98.2 F (36.8 C)] 98.2 F (36.8 C) (04/08 0600) Pulse Rate:  [68-78] 74 (04/08 1430) Resp:  [16-24] 24 (04/08 1430) BP: (125-169)/(65-96) 139/90 (04/08 1430) SpO2:  [95 %-100 %] 95 % (04/08 1430) Weight:  [73.5 kg] 73.5 kg (04/08 0601)   Constitutional: Appears well-developed and well-nourished.  Psych: Affect appropriate to situation Eyes: No scleral injection HENT: No OP obstrucion Head: Normocephalic.  Cardiovascular: Normal rate and regular rhythm.  Respiratory: Effort normal, non-labored breathing GI: Soft.  No distension. There is no tenderness.  Skin: WDI-multiple bruises on her legs Neuro: Mental Status: Patient is awake, alert, oriented to person, place, month, year, and situation. Speech-no aphasia, no dysarthria, naming,  repeating, and comprehension intact Patient is able to give a clear and coherent history. Cranial Nerves: II: She is blind in left eye and has a lateral upper quadrant field cut in right eye III,IV, VI: EOMI without ptosis or diploplia. Pupils equal, round and reactive to light V: Facial sensation is symmetric to temperature VII: Facial movement is symmetric.  VIII: hearing is intact to voice X: Palat elevates symmetrically XI: Shoulder shrug is symmetric. XII: tongue is midline without atrophy or fasciculations.  Motor: Left tricep extension 4/5, right leg 4 4/5 with drift otherwise 5/5 throughout Sensory: Sensation is symmetric to light touch and temperature in the arms and legs. DSS intact Deep Tendon Reflexes: 2+ and symmetric in the biceps and patellae. She does have cross adduction with knee jerk Plantars: Toes are downgoing bilaterally.  Cerebellar: FNF WNL H-K shows dysmetria with left heel over right shin  Labs I have reviewed labs in epic and the results pertinent to this consultation are:   CBC    Component Value Date/Time   WBC 18.3 (H) 11/25/2019 0706   RBC 3.60 (L) 11/25/2019 0706   HGB 11.6 (L) 11/25/2019 0706   HGB 11.9 11/24/2019 1058   HCT 37.6 11/25/2019 0706   HCT 36.0 11/24/2019 1058   PLT 157 11/25/2019 0706   PLT 169 11/24/2019 1058   MCV 104.4 (H) 11/25/2019 0706   MCV 98 (H) 11/24/2019 1058   MCH 32.2 11/25/2019 0706   MCHC 30.9 11/25/2019 0706   RDW 21.5 (H) 11/25/2019 0706   RDW 19.3 (H) 11/24/2019 1058   LYMPHSABS 0.9 11/25/2019 0706   LYMPHSABS 1.8 11/24/2019 1058   MONOABS 1.1 (H) 11/25/2019 0706   EOSABS 0.5 11/25/2019 0706   EOSABS 0.9 (H) 11/24/2019 1058   BASOSABS 0.7 (H) 11/25/2019 0706   BASOSABS 0.9 (H) 11/24/2019 1058    CMP     Component Value Date/Time   NA 141 11/25/2019 0706   NA 142 11/24/2019 1058   K 4.5 11/25/2019 0706   CL 104 11/25/2019 0706   CO2 25 11/25/2019 0706   GLUCOSE 103 (H) 11/25/2019 0706   BUN 12  11/25/2019 0706   BUN 17 11/24/2019 1058   CREATININE 1.15 (H) 11/25/2019 0706   CALCIUM 9.4 11/25/2019 0706   PROT 7.5 11/25/2019 0706   PROT 7.5 11/24/2019 1058   ALBUMIN 3.8 11/25/2019 0706   ALBUMIN 4.3 11/24/2019 1058   AST 27 11/25/2019 0706   ALT 15 11/25/2019 0706   ALKPHOS 76 11/25/2019  0706   BILITOT 0.8 11/25/2019 0706   BILITOT 0.5 11/24/2019 1058   GFRNONAA 45 (L) 11/25/2019 0706   GFRAA 52 (L) 11/25/2019 0706    Lipid Panel     Component Value Date/Time   CHOL 139 10/25/2019 0948   TRIG 120 10/25/2019 0948   HDL 43 10/25/2019 0948   CHOLHDL 3.2 10/25/2019 0948   LDLCALC 74 10/25/2019 0948     Imaging I have reviewed the images obtained:  CT head shows-no acute intracranial abnormality. Moderate to marked chronic microvascular ischemic changes  CTA of head and neck-no large vessel occlusion, chemically significant stenosis, or evidence of dissection. Very small aneurysm of the supraclinoid left ICA possible at the supra hypophyseal artery origin  MRI brain-scattered acute bilateral cerebral and cerebellar infarcts involving the corpus callosum genu, bilateral basal ganglia and left thalamus, likely embolic. Small left frontal convexity subdural hematoma, likely subacute on chronic  Etta Quill PA-C [facility] [phone]  M-F  (9:00 am- 5:00 PM)  11/25/2019, 2:51 PM     Assessment: 79 year old female presenting to the hospital after waking up with weak legs associated gait instability and left arm weakness. MRI as noted above did show scattered acute bilateral cerebral and cerebellar infarcts. Exam as above.   Impression: -Bilateral scattered emboli infarcts -Left arm weakness, right leg weakness  Recommend   -Transthoracic Echo, may need TEE and loop monitor if TTE unremarkable -- Asa 81mg  and Plavix 75 mg daily x 3 weeks  -Start or continue Atorvastatin 40 mg/other high intensity statin -BP goal: permissive HTN upto 220/120  mmHg -HBAIC and Lipid profile -Telemetry monitoring -Frequent neuro checks -NPO until passes stroke swallow screen -PT/OT   # please page stroke NP  Or  PA  Or MD from 8am -4 pm  as this patient from this time will be  followed by the stroke.   You can look them up on [email or web address]  Password [email or web address]  NEUROHOSPITALIST ADDENDUM Performed a face to face diagnostic evaluation.   I have reviewed the contents of history and physical exam as documented by PA/ARNP/Resident and agree with above documentation.  I have discussed and formulated the above plan as documented. Edits to the note have been made as needed.  79 year old female with hypertension, COPD, hyperlipidemia, CABG recently in March 2021, thrombocytosis with recently diagnosed CRA O in the left eye occurring 2 weeks ago.  She presents with left arm weakness and headache.  MRI brain was performed in the ED as part of work-up for stroke which showed bilateral multiple embolic infarcts.  CTA negative for any large vessel occlusion/intracranial atherosclerotic disease.  Exam most significant for impaired visual fields in the left eye apart from superior nasal quadrant, minimal weakness in the left arm, right leg drift.  Impression: Acute ischemic Stroke-bilateral multiple embolic infarcts likely cardioembolic source for stroke.  Recommendations as stated above, echocardiogram and telemetry to monitor for paroxysmal A. fib.  If echocardiogram venogram negative then consider TEE and loop monitor due to high suspicion for cardioembolic infarcts.    Karena Addison Aroor MD [facility] [phone]   If 7pm to 7am, please call on call as listed on AMION.

## 2019-11-25 NOTE — ED Notes (Signed)
Echo at bedside

## 2019-11-25 NOTE — ED Notes (Signed)
Date and time results received: 11/25/19   Test: Troponin Critical Value: 139  Name of Provider Notified: Roslynn Amble MD

## 2019-11-25 NOTE — ED Triage Notes (Addendum)
Arrives via EMS for weakness that has gotten worse after CABG surgery 2 weeks ago. Pt stood up to go to restroom and knees buckled/got weak and pt sat back down. Uses cane at baseline. Endorses HA and L arm weakness. No LOC or fall. Hx of HTN.   VSS en route. 68bpm NSR. 96% RA.  20G PIV established.

## 2019-11-25 NOTE — Consult Note (Signed)
CARDIOLOGY CONSULT NOTE       [name] MRN: [id number] DOB/AGE: [birth year] 80 y.o.  Admit date: 11/25/2019 Referring Physician: Karel Jarvis PA [place] Primary Physician: [name], MD Primary Cardiologist: Revankar Reason for Consultation: Elevated Troponin   Active Problems:   * No active hospital problems. *   HPI:  79 y.o. being seen in ER for ? Stroke with RLE cellulitis and elevated WBC. She was admitted with unstable angina And had CABG by Dr Cyndia Bent on 11/03/19 No post op afib. She noted post op that her vision was not right. At home she decribes left filed cut She has had weakness. She was Rx with cipro for SVG harvest site infection but couldn't tolerate and was changed to doxycycline Monday. No fever She has a history of Thrombocytosis Rx with hydroxyurea followed by Dr Hinton Rao and her WBC count is chronically elevated. Drainage in RLE has improved with lots of bruising No other focal neuro signs In ER CT head negative MRI pending Telemetry with NSR Dr Geraldo Pitter had placed a 14 day Zio patch to r/o PAF but this was removed for MRI Sternum is well healed and she has not had any chest pain Troponin 24-> 139 ECG with no acute changes SR with PAC  ROS All other systems reviewed and negative except as noted above  Past Medical History:  Diagnosis Date  . Anxiety   . Arthritis   . Asthma   . Bilateral edema of lower extremity   . Complication of anesthesia    difficulty waking  . Congestive heart failure (CHF) ([place])   . COPD (chronic obstructive pulmonary disease) ([place])   . Coronary artery disease   . Dyspnea on exertion   . GERD (gastroesophageal reflux disease)   . History of melanoma excision    left greast toe 2015  . History of squamous cell carcinoma excision    face--  multiple excisions  . History of subdural hematoma    2008  . Hypertension   . OSA (obstructive sleep apnea)    intolerant  . Thrombocytosis ([place])    takes hydoxyurea--  MONITORED  BY DR Hinton Rao [facility])  . VIN III (vulvar intraepithelial neoplasia III)   . Wears dentures    UPPER AND LOWER PARTIAL  . Wears glasses     Family History  Problem Relation Age of Onset  . Clotting disorder Mother   . Hypertension Mother   . Heart disease Mother   . Arthritis Mother   . Stroke Father   . Hypertension Father   . Heart disease Father   . Arthritis Father   . Emphysema Father   . Asthma Father   . Kidney failure Father   . Hypertension Sister   . Heart disease Sister   . Arthritis Sister   . Hypertension Brother   . Heart disease Brother   . Arthritis Brother     Social History   Socioeconomic History  . Marital status: Married    Spouse name: Not on file  . Number of children: Not on file  . Years of education: Not on file  . Highest education level: Not on file  Occupational History  . Not on file  Tobacco Use  . Smoking status: Never Smoker  . Smokeless tobacco: Never Used  Substance and Sexual Activity  . Alcohol use: No  . Drug use: No  . Sexual activity: Not on file  Other Topics Concern  . Not on  file  Social History Narrative  . Not on file   Social Determinants of Health   Financial Resource Strain:   . Difficulty of Paying Living Expenses:   Food Insecurity:   . Worried About Charity fundraiser in the Last Year:   . Arboriculturist in the Last Year:   Transportation Needs:   . Film/video editor (Medical):   Marland Kitchen Lack of Transportation (Non-Medical):   Physical Activity:   . Days of Exercise per Week:   . Minutes of Exercise per Session:   Stress:   . Feeling of Stress :   Social Connections:   . Frequency of Communication with Friends and Family:   . Frequency of Social Gatherings with Friends and Family:   . Attends Religious Services:   . Active Member of Clubs or Organizations:   . Attends Archivist Meetings:   Marland Kitchen Marital Status:   Intimate Partner Violence:   . Fear of Current or  Ex-Partner:   . Emotionally Abused:   Marland Kitchen Physically Abused:   . Sexually Abused:     Past Surgical History:  Procedure Laterality Date  . ABDOMINAL HYSTERECTOMY  age 56  . CARDIAC CATHETERIZATION  03/172021  . CORONARY ARTERY BYPASS GRAFT N/A 11/04/2019   Procedure: CORONARY ARTERY BYPASS GRAFTING (CABG) x 4, with ENDOSCOPIC HARVESTING OF RIGHT GREATER SAPHENOUS VEIN.;  Surgeon: Gaye Pollack, MD;  Location: [facility] OR;  Service: Open Heart Surgery;  Laterality: N/A;  . INTRAVASCULAR ULTRASOUND/IVUS N/A 11/03/2019   Procedure: Intravascular Ultrasound/IVUS;  Surgeon: Nelva Bush, MD;  Location: [facility];  Service: Cardiovascular;  Laterality: N/A;  . KNEE ARTHROSCOPY Left 2004  . LEFT HEART CATH AND CORONARY ANGIOGRAPHY N/A 11/03/2019   Procedure: LEFT HEART CATH AND CORONARY ANGIOGRAPHY;  Surgeon: Nelva Bush, MD;  Location: [facility];  Service: Cardiovascular;  Laterality: N/A;  . MELANOMA EXCISION  2015   left great toe  . REPAIR PERONEAL TENDONS ANKLE  2004  . SUBDURAL HEMATOMA EVACUATION VIA CRANIOTOMY  2008      week later  post-op  [facility]  . TEE WITHOUT CARDIOVERSION N/A 11/04/2019   Procedure: TRANSESOPHAGEAL ECHOCARDIOGRAM (TEE);  Surgeon: Gaye Pollack, MD;  Location: [place];  Service: Open Heart Surgery;  Laterality: N/A;  . VULVECTOMY N/A 10/24/2015   Procedure: WIDE LOCAL EXCISION OF THE VULVA ;  Surgeon: Everitt Amber, MD;  Location: [place];  Service: Gynecology;  Laterality: N/A;     No current facility-administered medications for this encounter.  Current Outpatient Medications:  .  Ascorbic Acid (VITAMIN C) 1000 MG tablet, Take 1,000 mg by mouth daily., Disp: , Rfl:  .  aspirin EC 325 MG EC tablet, Take 1 tablet (325 mg total) by mouth daily., Disp: , Rfl:  .  atorvastatin (LIPITOR) 40 MG tablet, Take 1 tablet (40 mg total) by mouth daily at 6 PM., Disp: 30 tablet, Rfl: 1 .  Calcium Carb-Cholecalciferol (CALCIUM 600 + D  PO), Take 1 tablet by mouth in the morning and at bedtime. , Disp: , Rfl:  .  doxycycline (VIBRAMYCIN) 100 MG capsule, Take 100 mg by mouth See admin instructions. Take 2 capsules on day 1, then 1 capsule daily., Disp: , Rfl:  .  erythromycin ophthalmic ointment, Place 1 application into both eyes daily as needed (pain/ irritation). , Disp: , Rfl:  .  ferrous Q000111Q C-folic acid (TRINSICON / FOLTRIN) capsule, Take 1 capsule by mouth  2 (two) times daily after a meal., Disp: 60 capsule, Rfl: 1 .  fluticasone (FLONASE) 50 MCG/ACT nasal spray, Place 2 sprays into both nostrils at bedtime. , Disp: , Rfl:  .  hydroxyurea (HYDREA) 500 MG capsule, Take 500 mg by mouth daily. , Disp: , Rfl:  .  lansoprazole (PREVACID) 30 MG capsule, Take 30 mg by mouth every morning. , Disp: , Rfl:  .  Magnesium 250 MG TABS, Take 250 mg by mouth at bedtime. , Disp: , Rfl:  .  metoprolol tartrate (LOPRESSOR) 25 MG tablet, Take 1 tablet (25 mg total) by mouth 2 (two) times daily., Disp: 30 tablet, Rfl: 1 .  Multiple Vitamins-Minerals (PRESERVISION AREDS 2) CAPS, Take 1 capsule by mouth in the morning and at bedtime., Disp: , Rfl:  .  mupirocin ointment (BACTROBAN) 2 %, Apply 1 application topically daily as needed (after cancer removal procedures). , Disp: , Rfl:  .  promethazine (PHENERGAN) 12.5 MG tablet, Take 12.5 mg by mouth 2 (two) times daily as needed for nausea or vomiting., Disp: , Rfl:  .  traMADol (ULTRAM) 50 MG tablet, Take 50 mg by mouth every 6 (six) hours as needed for moderate pain. Up to 7 days, Disp: , Rfl:  .  Vitamin D, Ergocalciferol, (DRISDOL) 1.25 MG (50000 UT) CAPS capsule, Take 50,000 Units by mouth every 14 (fourteen) days., Disp: , Rfl:  .  ammonium lactate (LAC-HYDRIN) 12 % lotion, Apply 1 application topically daily as needed (skin spots). , Disp: , Rfl:  .  ciprofloxacin (CIPRO) 250 MG tablet, Take 1 tablet (250 mg total) by mouth 2 (two) times daily for 10 days. (Patient not taking:  Reported on 11/25/2019), Disp: 20 tablet, Rfl: 0 .  fluconazole (DIFLUCAN) 150 MG tablet, Take 1 tablet by mouth once., Disp: , Rfl:     Physical Exam: Blood pressure (!) 150/89, pulse 73, temperature 98.2 F (36.8 C), temperature source Oral, resp. rate (!) 21, height 5\' 6"  (1.676 m), weight 73.5 kg, SpO2 98 %.    Affect appropriate Healthy:  appears stated age 27: normal Neck supple with no adenopathy JVP normal no bruits no thyromegaly Lungs clear with no wheezing and good diaphragmatic motion Heart:  S1/S2 no murmur, no rub, gallop or click PMI normal sternum well healed  Abdomen: benighn, BS positve, no tenderness, no AAA no bruit.  No HSM or HJR Distal pulses intact with no bruits No edema Neuro left visual field abnormal with left eye cannot see inferior segments  Bruising right thigh / leg from SVG harvest site no weeping infection healing  No muscular weakness   Labs:   Lab Results  Component Value Date   WBC 18.3 (H) 11/25/2019   HGB 11.6 (L) 11/25/2019   HCT 37.6 11/25/2019   MCV 104.4 (H) 11/25/2019   PLT 157 11/25/2019    Recent Labs  Lab 11/25/19 0706  NA 141  K 4.5  CL 104  CO2 25  BUN 12  CREATININE 1.15*  CALCIUM 9.4  PROT 7.5  BILITOT 0.8  ALKPHOS 76  ALT 15  AST 27  GLUCOSE 103*   No results found for: CKTOTAL, CKMB, CKMBINDEX, TROPONINI  Lab Results  Component Value Date   CHOL 139 10/25/2019   Lab Results  Component Value Date   HDL 43 10/25/2019   Lab Results  Component Value Date   LDLCALC 74 10/25/2019   Lab Results  Component Value Date   TRIG 120 10/25/2019   Lab Results  Component Value Date   CHOLHDL 3.2 10/25/2019   No results found for: LDLDIRECT    Radiology: CT Angio Head W or Wo Contrast  Result Date: 11/25/2019 CLINICAL DATA:  Sudden onset headache, left-sided vision loss and weakness EXAM: CT ANGIOGRAPHY HEAD AND NECK TECHNIQUE: Multidetector CT imaging of the head and neck was performed using the  standard protocol during bolus administration of intravenous contrast. Multiplanar CT image reconstructions and MIPs were obtained to evaluate the vascular anatomy. Carotid stenosis measurements (when applicable) are obtained utilizing NASCET criteria, using the distal internal carotid diameter as the denominator. CONTRAST:  62mL OMNIPAQUE IOHEXOL 350 MG/ML SOLN COMPARISON:  None. FINDINGS: CT HEAD Brain: There is no acute intracranial hemorrhage, mass effect, or edema. Gray-white differentiation is preserved. Confluent areas of hypoattenuation in the supratentorial white matter are nonspecific but may reflect moderate to marked chronic microvascular ischemic changes. Ventricles and sulci are within normal limits in size and configuration. Vascular: No hyperdense vessel. Intracranial atherosclerotic calcification at the skull base. Skull: Prior left frontoparietal craniotomy. Sinuses/Orbits: No acute finding. Other: None. Review of the MIP images confirms the above findings CTA NECK Aortic arch: Mild calcified plaque along the aortic arch and great vessel origins, which are patent. Right carotid system: Patent. Mild calcified plaque along the proximal ICA without measurable stenosis. Left carotid system: Patent. Mild primarily calcified plaque along the proximal ICA without measurable stenosis. Vertebral arteries: Patent and codominant. Skeleton: Degenerative changes of the cervical spine. Degenerative changes of the temporomandibular joints. Other neck: No mass or adenopathy. Upper chest: No apical lung mass. Review of the MIP images confirms the above findings CTA HEAD Anterior circulation: Intracranial internal carotid arteries are patent with mild calcified plaque. There is a 2 x 1.6 mm inferomedially directed outpouching from the supraclinoid left ICA. Anterior and middle cerebral arteries are patent. Posterior circulation: Intracranial vertebral arteries, basilar artery, and posterior cerebral arteries are  patent. Venous sinuses: As permitted by contrast timing, patent. Review of the MIP images confirms the above findings IMPRESSION: No acute intracranial abnormality. Moderate to marked chronic microvascular ischemic changes. No large vessel occlusion, hemodynamically significant stenosis, or evidence of dissection. 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 Electronically Signed   By: Macy Mis M.D.   On: 11/25/2019 08:54   CT Angio Neck W and/or Wo Contrast  Result Date: 11/25/2019 CLINICAL DATA:  Sudden onset headache, left-sided vision loss and weakness EXAM: CT ANGIOGRAPHY HEAD AND NECK TECHNIQUE: Multidetector CT imaging of the head and neck was performed using the standard protocol during bolus administration of intravenous contrast. Multiplanar CT image reconstructions and MIPs were obtained to evaluate the vascular anatomy. Carotid stenosis measurements (when applicable) are obtained utilizing NASCET criteria, using the distal internal carotid diameter as the denominator. CONTRAST:  78mL OMNIPAQUE IOHEXOL 350 MG/ML SOLN COMPARISON:  None. FINDINGS: CT HEAD Brain: There is no acute intracranial hemorrhage, mass effect, or edema. Gray-white differentiation is preserved. Confluent areas of hypoattenuation in the supratentorial white matter are nonspecific but may reflect moderate to marked chronic microvascular ischemic changes. Ventricles and sulci are within normal limits in size and configuration. Vascular: No hyperdense vessel. Intracranial atherosclerotic calcification at the skull base. Skull: Prior left frontoparietal craniotomy. Sinuses/Orbits: No acute finding. Other: None. Review of the MIP images confirms the above findings CTA NECK Aortic arch: Mild calcified plaque along the aortic arch and great vessel origins, which are patent. Right carotid system: Patent. Mild calcified plaque along the  proximal ICA without measurable stenosis.  Left carotid system: Patent. Mild primarily calcified plaque along the proximal ICA without measurable stenosis. Vertebral arteries: Patent and codominant. Skeleton: Degenerative changes of the cervical spine. Degenerative changes of the temporomandibular joints. Other neck: No mass or adenopathy. Upper chest: No apical lung mass. Review of the MIP images confirms the above findings CTA HEAD Anterior circulation: Intracranial internal carotid arteries are patent with mild calcified plaque. There is a 2 x 1.6 mm inferomedially directed outpouching from the supraclinoid left ICA. Anterior and middle cerebral arteries are patent. Posterior circulation: Intracranial vertebral arteries, basilar artery, and posterior cerebral arteries are patent. Venous sinuses: As permitted by contrast timing, patent. Review of the MIP images confirms the above findings IMPRESSION: No acute intracranial abnormality. Moderate to marked chronic microvascular ischemic changes. No large vessel occlusion, hemodynamically significant stenosis, or evidence of dissection. 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 Electronically Signed   By: Macy Mis M.D.   On: 11/25/2019 08:54   CARDIAC CATHETERIZATION  Result Date: 11/03/2019 Conclusions: 1. Multivessel coronary artery disease with heavy calcification of the major coronary arteries.  There is a 60% eccentric stenosis involving the distal LMCA that extends into the ostial LCx where there is at least 80% narrowing.  There is 50% stenosis of the mid LAD as well as chronic total occlusions of D1 and D2. 2. Hyperdynamic left ventricular systolic function with normal filling pressure. Recommendations: 1. Inpatient cardiac surgery consultation for management of accelerating angina, including chest pain during engagement of the left coronary artery.  While the LMCA stenosis is not critical by angiography or IVUS (MLA at least 8-10  mm^2), the lesion leads to severe stenosis of the ostial LCx.  The LCx stenosis cannot be treated percutaneously without involving the LMCA/LAD. 2. Start IV heparin 2 hours after deflation of TR band. 3. Obtain transthoracic echocardiogram. 4. Aggressive secondary prevention, including high-intensity statin therapy. 5. Start low-dose beta blocker for antianginal therapy and blood pressure control. Nelva Bush, MD [facility]   DG Chest Portable 1 View  Result Date: 11/25/2019 CLINICAL DATA:  Weakness EXAM: PORTABLE CHEST 1 VIEW COMPARISON:  11/07/2019 FINDINGS: Cardiomegaly. CABG. There is no edema, consolidation, effusion, or pneumothorax. Implantable loop recorder. Artifact from EKG leads. IMPRESSION: No evidence of acute disease. Electronically Signed   By: Monte Fantasia M.D.   On: 11/25/2019 07:43   DG Chest Port 1 View  Result Date: 11/07/2019 CLINICAL DATA:  Pleural effusion. EXAM: PORTABLE CHEST 1 VIEW COMPARISON:  Chest radiograph 11/06/2019 FINDINGS: A right IJ approach introducer sheath remains in position. Multiple previously demonstrated chest tubes have been removed. No evidence of pneumothorax. Prior median sternotomy/CABG. The cardiomediastinal silhouette is unchanged. Aortic atherosclerosis. Unchanged appearance of left basilar atelectasis with possible trace left pleural effusion. The lungs are otherwise clear. IMPRESSION: Multiple previously demonstrated chest tubes have been removed. No evidence of pneumothorax Unchanged position of a right IJ approach introducer sheath. Similar appearance of left basilar atelectasis with possible trace left pleural effusion. Electronically Signed   By: Kellie Simmering DO   On: 11/07/2019 08:58   DG Chest Port 1 View  Result Date: 11/06/2019 CLINICAL DATA:  History of CABG. EXAM: PORTABLE CHEST 1 VIEW COMPARISON:  Chest radiograph 11/05/2019 FINDINGS: Interval removal of a previously demonstrated Swan-Ganz catheter. The right IJ approach  introducer sheath remains in position. Prior CABG. Unchanged position of multiple chest tubes. The cardiomediastinal silhouette is unchanged. Aortic atherosclerosis. Persistent  left basilar atelectasis and possible small effusion. No evidence of pneumothorax IMPRESSION: Interval removal of a Swan-Ganz catheter. Support apparatus otherwise unchanged. Unchanged left basilar atelectasis with possible small left pleural effusion. Aortic atherosclerosis. Electronically Signed   By: Kellie Simmering DO   On: 11/06/2019 09:30   DG Chest Port 1 View  Result Date: 11/05/2019 CLINICAL DATA:  Chest tube present  S/p CABG EXAM: PORTABLE CHEST 1 VIEW COMPARISON:  Radiograph 11/04/2019 FINDINGS: Interval extubation and removal of NG tube. No increase in atelectasis. Persistent LEFT basilar atelectasis and effusion. Bilateral chest tubes in place.  No appreciable pneumothorax. Mediastinal drains and Swan-Ganz catheter unchanged IMPRESSION: 1. Extubation without complication. 2. Persistent LEFT basilar atelectasis and effusion. 3. Bilateral chest tubes in place without appreciable pneumothorax. Electronically Signed   By: Suzy Bouchard M.D.   On: 11/05/2019 09:09   DG Chest Port 1 View  Result Date: 11/04/2019 CLINICAL DATA:  Hypoxia EXAM: PORTABLE CHEST 1 VIEW COMPARISON:  November 03, 2019 FINDINGS: Patient is status post coronary artery bypass grafting. Endotracheal tube tip is 2.9 cm above the carina. Nasogastric tube tip and side port are below the diaphragm. Swan-Ganz catheter tip is in the proximal right main pulmonary artery. There is a chest tube on each side as well as a mediastinal drain. There is a minimal left apical pneumothorax. There is atelectatic change in the right upper lobe and right mid lung regions. There is also mild bibasilar atelectasis. There is calcification in the mitral annulus. There is aortic atherosclerosis. No adenopathy. No bone lesions. IMPRESSION: Tube and catheter positions as described.  Small left apical pneumothorax without tension component. Areas of scattered atelectatic change in the right upper lobe, right mid lung, and left base. Postoperative changes. Stable cardiac size. Aortic Atherosclerosis (ICD10-I70.0). Electronically Signed   By: Lowella Grip III M.D.   On: 11/04/2019 13:42   DG Chest Port 1 View  Result Date: 11/03/2019 CLINICAL DATA:  Preop respiratory exam EXAM: PORTABLE CHEST 1 VIEW COMPARISON:  Chest radiograph 10/25/2019 at [place]: The cardiomediastinal contours are unchanged. Mild cardiomegaly with aortic atherosclerosis. Pulmonary vasculature is normal. No consolidation, pleural effusion, or pneumothorax. No acute osseous abnormalities are seen. IMPRESSION: Mild cardiomegaly with aortic atherosclerosis. No congestive failure or acute pulmonary process. Aortic Atherosclerosis (ICD10-I70.0). Electronically Signed   By: Keith Rake M.D.   On: 11/03/2019 19:54   ECHOCARDIOGRAM COMPLETE  Result Date: 11/03/2019    ECHOCARDIOGRAM REPORT   Patient Name:   Yolanda Flowers Date of Exam: 11/03/2019 Medical Rec #:  [id number]      Height:       65.0 in Accession #:    [id number]     Weight:       172.0 lb Date of Birth:  03-04-1941     BSA:          1.855 m Patient Age:    65 years       BP:           141/81 mmHg Patient Gender: F              HR:           75 bpm. Exam Location:  Inpatient Procedure: 2D Echo Indications:    coronary artery disease  History:        Patient has no prior history of Echocardiogram examinations.                 Signs/Symptoms:Chest Pain.  Sonographer:  Johny Chess Referring Phys: [id number] BRYAN K BARTLE IMPRESSIONS  1. Left ventricular ejection fraction, by estimation, is 65 to 70%. The left ventricle has normal function. The left ventricle has no regional wall motion abnormalities. There is mild left ventricular hypertrophy. Left ventricular diastolic parameters are indeterminate.  2. Right ventricular systolic  function is normal. The right ventricular size is normal. There is mildly elevated pulmonary artery systolic pressure. The estimated right ventricular systolic pressure is Q000111Q mmHg.  3. The mitral valve is abnormal. Moderate mitral annular calcification. No evidence of mitral valve regurgitation.  4. The aortic valve is tricuspid. Aortic valve regurgitation is not visualized. Mild aortic valve sclerosis is present, with no evidence of aortic valve stenosis.  5. The inferior vena cava is normal in size with greater than 50% respiratory variability, suggesting right atrial pressure of 3 mmHg. FINDINGS  Left Ventricle: Left ventricular ejection fraction, by estimation, is 65 to 70%. The left ventricle has normal function. The left ventricle has no regional wall motion abnormalities. The left ventricular internal cavity size was normal in size. There is  mild left ventricular hypertrophy. Left ventricular diastolic parameters are indeterminate. Right Ventricle: The right ventricular size is normal. No increase in right ventricular wall thickness. Right ventricular systolic function is normal. There is mildly elevated pulmonary artery systolic pressure. The tricuspid regurgitant velocity is 2.78  m/s, and with an assumed right atrial pressure of 3 mmHg, the estimated right ventricular systolic pressure is Q000111Q mmHg. Left Atrium: Left atrial size was normal in size. Right Atrium: Right atrial size was normal in size. Pericardium: There is no evidence of pericardial effusion. Mitral Valve: The mitral valve is abnormal. Moderate mitral annular calcification. No evidence of mitral valve regurgitation. Tricuspid Valve: The tricuspid valve is normal in structure. Tricuspid valve regurgitation is trivial. Aortic Valve: The aortic valve is tricuspid. Aortic valve regurgitation is not visualized. Mild aortic valve sclerosis is present, with no evidence of aortic valve stenosis. Pulmonic Valve: The pulmonic valve was not well  visualized. Pulmonic valve regurgitation is not visualized. Aorta: The aortic root and ascending aorta are structurally normal, with no evidence of dilitation. Venous: The inferior vena cava is normal in size with greater than 50% respiratory variability, suggesting right atrial pressure of 3 mmHg. IAS/Shunts: The interatrial septum was not well visualized.  LEFT VENTRICLE PLAX 2D LVIDd:         4.48 cm  Diastology LVIDs:         2.99 cm  LV e' lateral:   8.70 cm/s LV PW:         1.23 cm  LV E/e' lateral: 10.7 LV IVS:        0.97 cm  LV e' medial:    8.16 cm/s LVOT diam:     1.90 cm  LV E/e' medial:  11.4 LV SV:         74 LV SV Index:   40 LVOT Area:     2.84 cm  RIGHT VENTRICLE RV S prime:     15.90 cm/s TAPSE (M-mode): 2.5 cm LEFT ATRIUM           Index       RIGHT ATRIUM           Index LA diam:      4.00 cm 2.16 cm/m  RA Area:     16.90 cm LA Vol (A4C): 47.0 ml 25.33 ml/m RA Volume:   45.40 ml  24.47 ml/m  AORTIC VALVE LVOT Vmax:  124.00 cm/s LVOT Vmean:  87.900 cm/s LVOT VTI:    0.261 m  AORTA Ao Root diam: 2.80 cm Ao Asc diam:  2.80 cm MITRAL VALVE                TRICUSPID VALVE MV Area (PHT): 2.29 cm     TR Peak grad:   30.9 mmHg MV Decel Time: 331 msec     TR Vmax:        278.00 cm/s MV E velocity: 93.40 cm/s MV A velocity: 144.00 cm/s  SHUNTS MV E/A ratio:  0.65         Systemic VTI:  0.26 m                             Systemic Diam: 1.90 cm Oswaldo Milian MD Electronically signed by Oswaldo Milian MD Signature Date/Time: 11/03/2019/6:00:14 PM    Final    ECHO INTRAOPERATIVE TEE  Result Date: 11/04/2019  *INTRAOPERATIVE TRANSESOPHAGEAL REPORT *  Patient Name:   Yolanda Flowers Date of Exam: 11/04/2019 Medical Rec #:  [id number]      Height:       65.0 in Accession #:    [id number]     Weight:       170.1 lb Date of Birth:  1940/12/29     BSA:          1.85 m Patient Age:    52 years       BP:           136/78 mmHg Patient Gender: F              HR:           65 bpm. Exam Location:   Anesthesiology Transesophogeal exam was perform intraoperatively during surgical procedure. Patient was closely monitored under general anesthesia during the entirety of examination. Indications:     Coronary artery disease Sonographer:     Vickie Epley RDCS Performing Phys: [id number] Diagnosing Phys: Annye Asa MD Complications: No known complications during this procedure. POST-OP IMPRESSIONS - Left Ventricle: The left ventricle is essentially unchanged from pre-bypass. Approximate EF 55-60%. There are no regional wall motion abnormalities. - Aortic Valve: The aortic valve appears unchanged from pre-bypass. - Mitral Valve: The mitral valve appears unchanged from pre-bypass. There is mild regurgitation. PRE-OP FINDINGS  Left Ventricle: The left ventricle has normal systolic function, with an ejection fraction of 60-65%, measures 65%. The cavity size was normal. There is no increase in left ventricular wall thickness. No evidence of left ventricular regional wall motion  abnormalities. Right Ventricle: The right ventricle has normal systolic function. The cavity was normal. There is no increase in right ventricular wall thickness. Left Atrium: Left atrial size was normal in size. The left atrial appendage is well visualized and there is no evidence of thrombus present. Left atrial appendage velocity is normal at greater than 40 cm/s. Right Atrium: Right atrial size was mildly dilated. Right atrial pressure is estimated at 10 mmHg. Interatrial Septum: No atrial level shunt detected by color flow Doppler. Pericardium: There is no evidence of pericardial effusion. Mitral Valve: The mitral valve is normal in structure. Mild thickening of the mitral valve leaflet. Mild calcification of the mitral valve annulus. Mitral valve regurgitation is mild by color flow Doppler. No evidence of mitral stenosis, with peak gradient 4 mmHg, mean gradient 1 mmHg. Tricuspid Valve: The tricuspid valve was normal in  structure.  Tricuspid valve regurgitation is mild by color flow Doppler. There is no evidence of tricuspid valve vegetation. Aortic Valve: The aortic valve is tricuspid. Aortic valve regurgitation was not visualized by color flow Doppler.There is no stenosis of the aortic valve, with peak gradient 9 mmHg, mean gradient 3 mmHg.Marland Kitchen There is no evidence of a vegetation on the aortic valve. Pulmonic Valve: The pulmonic valve was normal in structure, with normal excursion. Pulmonic valve regurgitation is trivial, around the PA catheter, by color flow Doppler. Aorta: The aortic arch was not well visualized. 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 Pulmonary Artery: Gordy Councilman catheter present on the right. The pulmonary artery is of normal size. Venous: The inferior vena cava is normal in size with greater than 50% respiratory variability, suggesting right atrial pressure of 3 mmHg. +-------------+--------++ AORTIC VALVE          +-------------+--------++ AV Mean Grad:3.0 mmHg +-------------+--------++ +-------------+--------++ MITRAL VALVE          +-------------+--------++ MV Mean grad:1.0 mmHg +-------------+--------++  Annye Asa MD Electronically signed by Annye Asa MD Signature Date/Time: 11/04/2019/6:24:22 PM    Final    VAS US DOPPLER PRE CABG  Result Date: 11/04/2019 PREOPERATIVE VASCULAR EVALUATION  Indications:      Pre-CABG. Risk Factors:     Hypertension, coronary artery disease. Limitations:      TR band right wrist Comparison Study: No prior study Performing Technologist: Maudry Mayhew MHA, RDMS, RVT, RDCS Supporting Technologist: Baldwin Crown RDMS, RVT  Examination Guidelines: A complete evaluation includes B-mode imaging, spectral Doppler, color Doppler, and power Doppler as needed of all accessible portions of each vessel. Bilateral testing is considered an integral part of a complete examination.  Limited examinations for reoccurring indications may be performed as noted.  Right Carotid Findings: +----------+-------+-------+--------+---------------------------------+--------+           PSV    EDV    StenosisDescribe                         Comments           cm/s   cm/s                                                     +----------+-------+-------+--------+---------------------------------+--------+ CCA Prox  91     21             smooth and heterogenous                   +----------+-------+-------+--------+---------------------------------+--------+ CCA Distal91     26             heterogenous, smooth and calcific         +----------+-------+-------+--------+---------------------------------+--------+ ICA Prox  77     20             heterogenous, smooth and calcific         +----------+-------+-------+--------+---------------------------------+--------+ ICA Distal99     32                                                       +----------+-------+-------+--------+---------------------------------+--------+ ECA  90     10             irregular, heterogenous and                                               calcific                                  +----------+-------+-------+--------+---------------------------------+--------+ Portions of this table do not appear on this page. +----------+--------+-------+----------------+------------+           PSV cm/sEDV cmsDescribe        Arm Pressure +----------+--------+-------+----------------+------------+ Subclavian108            Multiphasic, [id number]          +----------+--------+-------+----------------+------------+ +---------+--------+--+--------+--+---------+ VertebralPSV cm/s47EDV cm/s16Antegrade +---------+--------+--+--------+--+---------+ Left Carotid Findings: +----------+-------+-------+--------+---------------------------------+--------+           PSV    EDV     StenosisDescribe                         Comments           cm/s   cm/s                                                     +----------+-------+-------+--------+---------------------------------+--------+ CCA Prox  78     16                                                       +----------+-------+-------+--------+---------------------------------+--------+ CCA Distal82     16             smooth, heterogenous and calcific         +----------+-------+-------+--------+---------------------------------+--------+ ICA Prox  92     20             heterogenous, irregular and                                               calcific                                  +----------+-------+-------+--------+---------------------------------+--------+ ICA Distal77     28                                                       +----------+-------+-------+--------+---------------------------------+--------+ ECA       128    8              irregular, heterogenous and  calcific                                  +----------+-------+-------+--------+---------------------------------+--------+ +----------+--------+--------+----------------+------------+ SubclavianPSV cm/sEDV cm/sDescribe        Arm Pressure +----------+--------+--------+----------------+------------+           165             Multiphasic, [id number]          +----------+--------+--------+----------------+------------+ +---------+--------+--+--------+--+---------+ VertebralPSV cm/s69EDV cm/s15Antegrade +---------+--------+--+--------+--+---------+  ABI Findings: +--------+------------------+-----+---------+--------+ Right   Rt Pressure (mmHg)IndexWaveform Comment  +--------+------------------+-----+---------+--------+ [id number]                    triphasic         +--------+------------------+-----+---------+--------+ PTA     98                 0.73 triphasic         +--------+------------------+-----+---------+--------+ DP      128               0.96 triphasic         +--------+------------------+-----+---------+--------+ +--------+------------------+-----+---------+-------+ Left    Lt Pressure (mmHg)IndexWaveform Comment +--------+------------------+-----+---------+-------+ [id number]                    triphasic        +--------+------------------+-----+---------+-------+ PTA     145               1.08 triphasic        +--------+------------------+-----+---------+-------+ DP      143               1.07 triphasic        +--------+------------------+-----+---------+-------+ +-------+---------------+----------------+ ABI/TBIToday's ABI/TBIPrevious ABI/TBI +-------+---------------+----------------+ Right  0.96                            +-------+---------------+----------------+ Left   1.08                            +-------+---------------+----------------+  Right Doppler Findings: +-----------+--------+-----+---------+--------+ Site       PressureIndexDoppler  Comments +-----------+--------+-----+---------+--------+ Brachial   132          triphasic         +-----------+--------+-----+---------+--------+ Radial                  triphasic         +-----------+--------+-----+---------+--------+ Ulnar                   triphasic         +-----------+--------+-----+---------+--------+ Palmar Arch                      TR band  +-----------+--------+-----+---------+--------+  Left Doppler Findings: +--------+--------+-----+---------+--------+ Site    PressureIndexDoppler  Comments +--------+--------+-----+---------+--------+ [id number]          triphasic         +--------+--------+-----+---------+--------+ Radial               triphasic         +--------+--------+-----+---------+--------+ Ulnar                triphasic          +--------+--------+-----+---------+--------+  Summary: Right Carotid: Velocities in the right ICA are consistent with a 1-39% stenosis. Left Carotid: Velocities in the left ICA are consistent with a 1-39%  stenosis. Vertebrals:  Bilateral vertebral arteries demonstrate antegrade flow. Subclavians: Normal flow hemodynamics were seen in bilateral subclavian              arteries. Right ABI: Resting right ankle-brachial index is within normal range. No evidence of significant right lower extremity arterial disease. Left ABI: Resting left ankle-brachial index is within normal range. No evidence of significant left lower extremity arterial disease. Right Upper Extremity: No significant arterial obstruction detected in the right upper extremity. Left Upper Extremity: No significant arterial obstruction detected in the left upper extremity. Normal PPG waveforms with radial artery compression suggest palmar arch patency. Doppler waveform obliterate with left radial compression. Doppler waveform obliterate with left ulnar compression.  Electronically signed by Servando Snare MD on 11/04/2019 at 5:00:28 PM.    Final     EKG: SR PaC no acute changes    ASSESSMENT AND PLAN:   1. Elevated Troponin:  No chest pain, no ECG changes post CABG on 11/03/19 by Dr Cyndia Bent no evidence of graft failure  Continue asa/statin and beta blocker  2. CVA:  ? CT negative MRI pending may have been issue with atherosclerotic plaque at time of aortic cross clamping as patient seems to indicate immediate issue post op and at that time there was definitely no PAF. Her Zio patch was d/c in ER May benefit from ILR and EP consult for implant to r/o PAF before d/c Preop dopplers no carotid disease and CTA neck no lesions this admit  3. Cellulitis:  On doxycycline seems to be healing   4. Thrombocytosis:  PLT 157 on hydroxeurea as outpatient WBC chronically elevated f/u hematology   5. HLD:  Continue statin   Signed: Jenkins Rouge 11/25/2019,  1:05 PM

## 2019-11-25 NOTE — H&P (Addendum)
[facility] Admission History and Physical Service Pager: [phone]  Patient name: Yolanda Flowers Medical record number: [id number] Date of birth: Jun 29, 1941 Age: 79 y.o. Gender: female  Primary Care Provider: [name], MD Consultants: Cardio, Neuro Code Status: DNR Preferred Emergency Contact: Mayra Neer [phone]  Chief Complaint: Weakness  Assessment and Plan: Yolanda Flowers is a 79 y.o. female presenting with left arm weakness, headache, and vision changes. PMH is significant for HTN, COPD, GERD, h/o subdural hematoma in 2008, HLD, CAD s/p CABG 11/04/2019, thrombocytosis, cellulitis, melanoma in situ + SCC, vulvar intraepithelial neoplasia III in 2017.  Weakness 2/2 Acute CVA Patient presented to ED due to lower extremity weakness that start this morning at 230am. Also endorses headache, and vision changes that started 2 weeks ago, and sharp pains in the left scapula. Continues to have LE weakness and vision changes but headache and left arm pain has since resolved s/p tylenol 650mg  x1. Has a hx of CABG 3 weeks ago by CVTS Dr. Cyndia Bent and has been followed closely oupatient. Neuro and Cardio consulted in the ED. Etiology for symptoms most likely secondary to embolic stroke from unknown etiology - considering arhythmia vs possibly from prior CV surgery (1-3% chance per my research). 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 MRI brain scattered acute bilateral cerebral and cerebellar infarcts involving the corpus callosum genu, bilateral basal ganglia and left thalamus, likely embolic.Small left frontal convexity subdural hematoma, likely subacute on chronic.Extensive chronic microvascular ischemic changes. Echo: EF of Q000111Q grade I diastolic dysfunction otherwise normal. Had pre-op carotid duplex studies during prior admission in March that did not show any  significant carotid disease. On exam she does not have any focal deficits but has continued right LE weakness. CVA likely due to combination of hypercoagulable state (hx of thrombocytosis) and recent cardiac surgery. No known arrhythmia but unable to rule out at this time. Cardiology following with potential plan for loop recorder and TEE. Will admit to FPTS for continued medical work up while start DAPT. Home meds: Metoprolol for antianginal and HTN therapy.  -Admit FPTS, Dr. Andrena Mews, Telemetry -DNR confirmed by patient and daughter-in-law -Start DAPT with 300mg  plavix +325mg  once followed by 75mg  daily +ASA 81mg  -Start Lipitor 80mg  daily -Neurology consulted; appreciate recommendations (hold metoprolol for permissive HTN up to 220/120) -Cardiology consulted; appreciate recommendations (TEE, loop recorder) -continuous cardiac monitoring -up with assistance -vital signs per protocol - speech eval for swallow study - NPO until passess swallow study - Hold VTE given known LE wound and subacute/chronic left frontal subdural hematoma. Will discuss with neuro -PT/OT recs - Last A1C 3/20201: 5.2 - obtain lipid panel  Elevate troponin  CAD s/p CABG 11/03/2019 Cardiology consulted in the ED. Trops 24>139>257>275. Patient does not endorse chest pain at this time but did have left arm weakness and scapular pain this morning at 230am that has since resolved. No hx of arrhythmia but appreciated irregular rhythm regular heart upon auscultation.  -DAPT as above -continue to trend trops -Cardiology consulted as above -Hold metoprolol as above -repeat EKG and trop if develops chest pain  HLD Lipid panel 10/25/2019. Tot 139. HDL 43. LDL 74. -Increase home statin to lipitor 80mg  as above  HTN BP 140/87. Present with a headache and left arm pain in the setting of acute CVA. Will allow for permissive HTN up to 220/120.  -Hold metoprolol as above  Thrombocytosis.  Chronic. Plt on admission 157. Home  medications are: Hydroxyurea 500mg  daily, ASA 325mg   -Continue hydroxyurea -Start DAPT -Monitor on CBC  Macrocytic Anemia. Chronic.  Hgb 11.6. At baseline. MCV 104. 4. Could be vitamin B12 deficient. Not likely alcohol related due to lack of history. -B12 lvl -Monitor on CBC  Neutrophilia. Likely Chronic. WBC 18.3. ANC 14,500. Patient is afebrile. Could be due to acute infection.  -Continue to monitor on CBC  LE wound cellulitis. Improving. Reports swelling at the site of vessel removal for CABG surgery 3 weeks ago. Initially treated with 10 days of cipro but appears developed cellulitis and was then transitioned to Doxycycline. Wound is non infectious appearing and appears to be healing well. Vast surrounding ecchymosis with minimal edema. - Continue doxycycline, will need to determine duration -Consider whether continued doxycycline is necessary -Consider wound care constul  FEN/GI: Miralax Prophylaxis: Lovenox  Disposition: Telemetry, INP  History of Present Illness:  Yolanda Flowers is a 79 y.o. female presenting with bilateral lower extremity weakness that started at 2:30am. She also reports a new right sided headache that started this morning. She notes that she tried to ambulate to go to the bathroom she was very unsteady and her legs wanted to give out on her. Daughter in law in exam room at time of this evaluation and she denied any slurred speech or facial droop. She denies any upper extremity weakness. Notes some vision changes in her left eye that presented after her CABG on 3/18. She saw ophthalmologist and was noted to have a retinal artery occlusion. She was instructed to follow up outpatient. She denies any changes in vision or hearing other than that. Denies any difficulty swallowing.   Per chart review ,she was hospitalized on 3/17 for angina. She had a CT scan that noted significant calcium and multivessel CAD with possible obstruction. She had a CABG on [id number] without  complication other than possible retinal artery occulusion from procedure. She did have some expected acute blood loss anemia and expected acute fluid overload. She did receive 1 unit of packed red blood cells.  She does have a healing cellulitis that has been actively treated with Doxycycline. She saw [facility] outpatient on 3/29 who is following and managing.   Review Of Systems: Per HPI with the following additions:   Review of Systems  HENT: Positive for hearing loss. Negative for tinnitus.   Eyes: Positive for blurred vision.  Cardiovascular: Negative for chest pain.  Gastrointestinal: Positive for abdominal pain. Negative for constipation.  Musculoskeletal: Negative for myalgias.  Neurological: Positive for weakness and headaches. Negative for sensory change and focal weakness.  Psychiatric/Behavioral: The patient is nervous/anxious.    Patient Active Problem List   Diagnosis Date Noted  . S/P CABG x 4 11/04/2019  . CAD (coronary artery disease) 10/25/2019  . Accelerating angina ([place]) 08/26/2019  . Vitamin D deficiency 08/24/2019  . Combined hyperlipidemia 08/24/2019  . Body mass index 29.0-29.9, adult 08/24/2019  . Vitamin B 12 deficiency 08/24/2019  . Malignant melanoma of great toe ([place]) 08/24/2019  . Pedal edema 08/24/2019  . Skin cancer 08/24/2019  . Squamous cell carcinoma, scalp/neck 08/24/2019  . Ischial bursitis, right 08/24/2019  . Swelling of limb 08/24/2019  . Renal insufficiency 08/24/2019  . Atherosclerosis of arteries 08/24/2019  . Dyspnea 02/09/2019  . Solitary pulmonary nodule on lung CT 02/09/2019  . Gastrointestinal bleeding 06/23/2018  . VIN III (vulvar intraepithelial neoplasia III) 10/09/2015  . Melanoma in situ ([place]) 06/29/2014  .  Essential thrombocytosis ([place]) 06/22/2014  . COPD (chronic obstructive pulmonary disease) ([place]) 06/21/2014  . HTN (hypertension) 06/21/2014  . Allergic rhinitis 06/21/2014  . GERD (gastroesophageal reflux disease) 06/21/2014     Past Medical History: Past Medical History:  Diagnosis Date  . Anxiety   . Arthritis   . Asthma   . Bilateral edema of lower extremity   . Complication of anesthesia    difficulty waking  . Congestive heart failure (CHF) ([place])   . COPD (chronic obstructive pulmonary disease) ([place])   . Coronary artery disease   . Dyspnea on exertion   . GERD (gastroesophageal reflux disease)   . History of melanoma excision    left greast toe 2015  . History of squamous cell carcinoma excision    face--  multiple excisions  . History of subdural hematoma    2008  . Hypertension   . OSA (obstructive sleep apnea)    intolerant  . Thrombocytosis ([place])    takes hydoxyurea--  MONITORED BY DR Hinton Rao [facility])  . VIN III (vulvar intraepithelial neoplasia III)   . Wears dentures    UPPER AND LOWER PARTIAL  . Wears glasses     Past Surgical History: Past Surgical History:  Procedure Laterality Date  . ABDOMINAL HYSTERECTOMY  age 30  . CARDIAC CATHETERIZATION  03/172021  . CORONARY ARTERY BYPASS GRAFT N/A 11/04/2019   Procedure: CORONARY ARTERY BYPASS GRAFTING (CABG) x 4, with ENDOSCOPIC HARVESTING OF RIGHT GREATER SAPHENOUS VEIN.;  Surgeon: Gaye Pollack, MD;  Location: [place] OR;  Service: Open Heart Surgery;  Laterality: N/A;  . INTRAVASCULAR ULTRASOUND/IVUS N/A 11/03/2019   Procedure: Intravascular Ultrasound/IVUS;  Surgeon: Nelva Bush, MD;  Location: [facility];  Service: Cardiovascular;  Laterality: N/A;  . KNEE ARTHROSCOPY Left 2004  . LEFT HEART CATH AND CORONARY ANGIOGRAPHY N/A 11/03/2019   Procedure: LEFT HEART CATH AND CORONARY ANGIOGRAPHY;  Surgeon: Nelva Bush, MD;  Location: [facility];  Service: Cardiovascular;  Laterality: N/A;  . MELANOMA EXCISION  2015   left great toe  . REPAIR PERONEAL TENDONS ANKLE  2004  . SUBDURAL HEMATOMA EVACUATION VIA CRANIOTOMY  2008      week later  post-op  [facility]  . TEE WITHOUT CARDIOVERSION N/A  11/04/2019   Procedure: TRANSESOPHAGEAL ECHOCARDIOGRAM (TEE);  Surgeon: Gaye Pollack, MD;  Location: [place];  Service: Open Heart Surgery;  Laterality: N/A;  . VULVECTOMY N/A 10/24/2015   Procedure: WIDE LOCAL EXCISION OF THE VULVA ;  Surgeon: Everitt Amber, MD;  Location: [place];  Service: Gynecology;  Laterality: N/A;    Social History: Social History   Tobacco Use  . Smoking status: Never Smoker  . Smokeless tobacco: Never Used  Substance Use Topics  . Alcohol use: No  . Drug use: No   Additional social history: N/A  Please also refer to relevant sections of EMR.  Family History: Family History  Problem Relation Age of Onset  . Clotting disorder Mother   . Hypertension Mother   . Heart disease Mother   . Arthritis Mother   . Stroke Father   . Hypertension Father   . Heart disease Father   . Arthritis Father   . Emphysema Father   . Asthma Father   . Kidney failure Father   . Hypertension Sister   . Heart disease Sister   . Arthritis Sister   . Hypertension Brother   . Heart disease Brother   . Arthritis  Brother     Allergies and Medications: Allergies  Allergen Reactions  . Cephalexin Hives  . Ciprofloxacin Other (See Comments)    Gi intolerance  . Penicillins Rash    Childhood reaction Did it involve swelling of the face/tongue/throat, SOB, or low BP? No Did it involve sudden or severe rash/hives, skin peeling, or any reaction on the inside of your mouth or nose? No Did you need to seek medical attention at a hospital or doctor's office? No When did it last happen?50 years ago If all above answers are "NO", may proceed with cephalosporin use.    No current facility-administered medications on file prior to encounter.   Current Outpatient Medications on File Prior to Encounter  Medication Sig Dispense Refill  . Ascorbic Acid (VITAMIN C) 1000 MG tablet Take 1,000 mg by mouth daily.    Marland Kitchen aspirin EC 325 MG EC tablet Take 1 tablet (325  mg total) by mouth daily.    Marland Kitchen atorvastatin (LIPITOR) 40 MG tablet Take 1 tablet (40 mg total) by mouth daily at 6 PM. 30 tablet 1  . Calcium Carb-Cholecalciferol (CALCIUM 600 + D PO) Take 1 tablet by mouth in the morning and at bedtime.     Marland Kitchen doxycycline (VIBRAMYCIN) 100 MG capsule Take 100 mg by mouth See admin instructions. Take 2 capsules on day 1, then 1 capsule daily.    Marland Kitchen erythromycin ophthalmic ointment Place 1 application into both eyes daily as needed (pain/ irritation).     . ferrous Q000111Q C-folic acid (TRINSICON / FOLTRIN) capsule Take 1 capsule by mouth 2 (two) times daily after a meal. 60 capsule 1  . fluticasone (FLONASE) 50 MCG/ACT nasal spray Place 2 sprays into both nostrils at bedtime.     . hydroxyurea (HYDREA) 500 MG capsule Take 500 mg by mouth daily.     . lansoprazole (PREVACID) 30 MG capsule Take 30 mg by mouth every morning.     . Magnesium 250 MG TABS Take 250 mg by mouth at bedtime.     . metoprolol tartrate (LOPRESSOR) 25 MG tablet Take 1 tablet (25 mg total) by mouth 2 (two) times daily. 30 tablet 1  . Multiple Vitamins-Minerals (PRESERVISION AREDS 2) CAPS Take 1 capsule by mouth in the morning and at bedtime.    . mupirocin ointment (BACTROBAN) 2 % Apply 1 application topically daily as needed (after cancer removal procedures).     . promethazine (PHENERGAN) 12.5 MG tablet Take 12.5 mg by mouth 2 (two) times daily as needed for nausea or vomiting.    . traMADol (ULTRAM) 50 MG tablet Take 50 mg by mouth every 6 (six) hours as needed for moderate pain. Up to 7 days    . Vitamin D, Ergocalciferol, (DRISDOL) 1.25 MG (50000 UT) CAPS capsule Take 50,000 Units by mouth every 14 (fourteen) days.    Marland Kitchen ammonium lactate (LAC-HYDRIN) 12 % lotion Apply 1 application topically daily as needed (skin spots).     . ciprofloxacin (CIPRO) 250 MG tablet Take 1 tablet (250 mg total) by mouth 2 (two) times daily for 10 days. (Patient not taking: Reported on 11/25/2019) 20  tablet 0  . fluconazole (DIFLUCAN) 150 MG tablet Take 1 tablet by mouth once.      Objective: BP 125/78   Pulse 75   Temp 98.2 F (36.8 C) (Oral)   Resp 19   Ht 5\' 6"  (1.676 m)   Wt 73.5 kg   SpO2 100%   BMI 26.15 kg/m  Exam: General: No acute distress. Age appropriate. Daughter-in-law at bedside and attentive Eyes: PERRL. Bilateral cataracts.  ENTM: Patent nares.  Neck: Supple neck. No lymphadenopathy Cardiovascular: Irregular rate and rhythm. No murmurs Respiratory: CTAB. Normal effort Gastrointestinal: soft, LLQ tenderness, +BS MSK: 5/5 UE strength, 4/5 RLE, 5/5 LLE Derm: RLE with healing scar and ecchymosis  Neuro: Alert & oriented x3. CN II-XII grossly intact. No nystagmus. Heel-to-shin: fail bilaterally Psych: Normal affect  Labs and Imaging: CBC BMET  Recent Labs  Lab 11/25/19 0706  WBC 18.3*  HGB 11.6*  HCT 37.6  PLT 157   Recent Labs  Lab 11/25/19 0706  NA 141  K 4.5  CL 104  CO2 25  BUN 12  CREATININE 1.15*  GLUCOSE 103*  CALCIUM 9.4     EKG: Sinus rhythm HR 70BPM  PORTABLE CHEST 1 VIEW COMPARISON:  11/07/2019 IMPRESSION: No evidence of acute disease.  CT ANGIOGRAPHY HEAD AND NECK COMPARISON:  None. IMPRESSION: No acute intracranial abnormality. Moderate to marked chronic microvascular ischemic changes. No large vessel occlusion, hemodynamically significant stenosis, or evidence of dissection. 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  MRI HEAD WITHOUT CONTRAST COMPARISON:  11/25/2019 CTA  head and neck.  06/27/2012 MRI head. IMPRESSION: Scattered acute bilateral cerebral and cerebellar infarcts involving the corpus callosum genu, bilateral basal ganglia and left thalamus, likely embolic. Small left frontal convexity subdural hematoma, likely subacute on chronic. Extensive chronic microvascular ischemic changes.   Gerlene Fee, DO 11/25/2019, 1:42 PM PGY-1,  [place] Intern pager: [phone], text pages welcome  Upper Level Addendum:  I have seen and evaluated this patient along with Dr. Janus Molder and reviewed the above note, making necessary revisions in green.   Mina Marble, D.O. 11/26/2019, 6:36 AM PGY-2, [place]

## 2019-11-25 NOTE — Progress Notes (Addendum)
Pt admitted to room 3W17 at this time.  Alert and oriented.  Telemetry placed on patient and CCMD called.  Call bell within reach, bed in lowest position, and bed alarm set.  Pt verbalizes understanding of calling for assistance before attempting to get OOB.  Family called and informed patient admitted to room.

## 2019-11-25 NOTE — ED Notes (Signed)
hospitalist at bedside

## 2019-11-25 NOTE — ED Notes (Signed)
Family Medicine MD at bedside.

## 2019-11-25 NOTE — ED Notes (Signed)
Received call from MRI requesting device be removed from left chest in order to do MRI, MD made aware.

## 2019-11-25 NOTE — ED Notes (Signed)
Dr. Johnsie Cancel cardiologist made aware of troponin 257. No new orders given will continues to monitor.

## 2019-11-26 ENCOUNTER — Inpatient Hospital Stay (HOSPITAL_COMMUNITY): Payer: PPO

## 2019-11-26 ENCOUNTER — Encounter (HOSPITAL_COMMUNITY): Payer: Self-pay | Admitting: Family Medicine

## 2019-11-26 DIAGNOSIS — L03115 Cellulitis of right lower limb: Secondary | ICD-10-CM

## 2019-11-26 DIAGNOSIS — R109 Unspecified abdominal pain: Secondary | ICD-10-CM

## 2019-11-26 DIAGNOSIS — I639 Cerebral infarction, unspecified: Secondary | ICD-10-CM

## 2019-11-26 LAB — COMPREHENSIVE METABOLIC PANEL
ALT: 15 U/L (ref 0–44)
AST: 21 U/L (ref 15–41)
Albumin: 3.6 g/dL (ref 3.5–5.0)
Alkaline Phosphatase: 76 U/L (ref 38–126)
Anion gap: 14 (ref 5–15)
BUN: 12 mg/dL (ref 8–23)
CO2: 22 mmol/L (ref 22–32)
Calcium: 8.8 mg/dL — ABNORMAL LOW (ref 8.9–10.3)
Chloride: 106 mmol/L (ref 98–111)
Creatinine, Ser: 1.2 mg/dL — ABNORMAL HIGH (ref 0.44–1.00)
GFR calc Af Amer: 50 mL/min — ABNORMAL LOW (ref >60)
GFR calc non Af Amer: 43 mL/min — ABNORMAL LOW (ref >60)
Glucose, Bld: 85 mg/dL (ref 70–99)
Potassium: 4.2 mmol/L (ref 3.5–5.1)
Sodium: 142 mmol/L (ref 135–145)
Total Bilirubin: 1 mg/dL (ref 0.3–1.2)
Total Protein: 7.2 g/dL (ref 6.5–8.1)

## 2019-11-26 LAB — CBC
HCT: 35.6 % — ABNORMAL LOW (ref 36.0–46.0)
Hemoglobin: 11.5 g/dL — ABNORMAL LOW (ref 12.0–15.0)
MCH: 32.3 pg (ref 26.0–34.0)
MCHC: 32.3 g/dL (ref 30.0–36.0)
MCV: 100 fL (ref 80.0–100.0)
Platelets: 145 10*3/uL — ABNORMAL LOW (ref 150–400)
RBC: 3.56 MIL/uL — ABNORMAL LOW (ref 3.87–5.11)
RDW: 21.2 % — ABNORMAL HIGH (ref 11.5–15.5)
WBC: 18.7 10*3/uL — ABNORMAL HIGH (ref 4.0–10.5)
nRBC: 0.2 % (ref 0.0–0.2)

## 2019-11-26 MED ORDER — ENOXAPARIN SODIUM 40 MG/0.4ML ~~LOC~~ SOLN
40.0000 mg | SUBCUTANEOUS | Status: DC
  Administered 2019-11-26 – 2019-11-30 (×5): 40 mg via SUBCUTANEOUS
  Filled 2019-11-26 (×5): qty 0.4

## 2019-11-26 NOTE — Hospital Course (Addendum)
Yolanda Flowers a 79 y.o. female presented with left arm weakness, headache, and vision changes. PMH is significant for HTN, COPD, GERD, h/o subdural hematoma in 2008, HLD, CAD s/p CABG 11/04/2019, thrombocytosis, cellulitis, melanoma in situ + SCC, vulvar intraepithelial neoplasia III in 2017. Hospital course is outlined below. Please see H&P for additional admission details.  Weakness 2/2 Acute CVA Patient presented to ED due to lower extremity weakness that start this morning of 11/25/19. Also endorsed headache, vision changes that started 2 weeks ago, and sharp pains in the left scapula. Headache and shoulder pain resolved s/p tylenol 650mg  x1 in the ED. At time of admission CN II-XII were grossly intact and exam was remarkable for right lower extremity weakness. 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 MRI brain showed scattered acute bilateral cerebral and cerebellar infarcts involving the corpus callosum genu, bilateral basal ganglia and left thalamus, likely embolic. Small left frontal convexity subdural hematoma, likely subacute on chronic. Neurology was consulted and patient was started on DAPT (300mg  plavix +325mg  once followed by 75mg  daily +ASA 81mg  daily) and Lipitor 80mg  daily. Home metoprolol was held to allow permissive HTN up to 220/172mmHg. She was evaluated for inpatient rehabilitation due to continue lower extremeity weakness. She was discharge to CIR in stable condition.   Elevated troponin, Hx of CAD s/p CABG 11/03/2019 Troponin at time of admission was 24 and trended up to 275. She did not endorse chest pain and EKG was unchanged from last. Patient with hx of CABG 3 weeks ago by CVTS Dr. Cyndia Bent and has been followed closely oupatient. Cardiology was consulted and recommended loop recorder placed on the day of discharge.Troponin trended flat to 254.

## 2019-11-26 NOTE — Progress Notes (Signed)
Bilateral lower extremity venous duplex has been completed. Preliminary results can be found in CV Proc through chart review.   11/26/19 1:37 PM Carlos Levering RVT

## 2019-11-26 NOTE — Progress Notes (Signed)
Family Medicine Teaching Service Daily Progress Note Intern Pager: [phone]  Patient name: Yolanda Flowers Medical record number: [id number] Date of birth: [birth year] Age: 79 y.o. Gender: female  Primary Care Provider: [name], MD Consultants: Neuro, Cardio Code Status: DNR  Pt Overview and Major Events to Date:  4/8 Admitted, Neuro + Cardio consulted 4/9 LE doppler US  Assessment and Plan: Yolanda Flowers is a 79 y.o. female presenting with left arm weakness, headache, and vision changes. PMH is significant for HTN, COPD, GERD, h/o subdural hematoma in 2008, HLD, CAD s/p CABG 11/04/2019, thrombocytosis, cellulitis, melanoma in situ + SCC, vulvar intraepithelial neoplasia III in 2017.  Weakness 2/2 Acute CVA Does not endorse weakness until standing. Resistance strength of LE seems to be improved since admission. Etiology for symptoms most likely secondary to embolic stroke from unknown etiology - considering arhythmia vs possibly from prior CV surgery (1-3% chance per my research). -Continue DAPT 75mg  daily +ASA 81mg   -Continue Lipitor 80mg  daily -Neurology consulted; appreciate recommendations (hold metoprolol for permissive HTN up to 220/120) -Cardiology consulted; appreciate recommendations (TEE, loop recorder) -continuous cardiac monitoring -up with assistance -vital signs per protocol - Hold VTE given known LE wound and subacute/chronic left frontal subdural hematoma. Will discuss with neuro -PT/OT recs  LE wound cellulitis. Improving. Reports swelling at the site of vessel removal for CABG surgery 3 weeks ago. Wound is non infectious appearing and appears to be healing well. Vast surrounding ecchymosis with minimal edema. No other systemic symptoms. -Obtain LE doppler US - Continue doxycycline until 4/10 for a total of 5 days.  -Consider wound care consult  Abdominal discomfort RLQ pain described as dull. Initially was on the left at time of admission. Last BM was this  morning. Non tender with light and deep palpation. Recent change in appetite could likely contribute as she is increasing her intake now.  -Continue to monitor -Consider imaging if worsens  Elevate troponin, trended flat  CAD s/p CABG 11/03/2019 Cardiology consulted in the ED. Trops 24>139>257>275>254. No hx of arrhythmia but appreciated irregular rhythm regular heart upon auscultation.  -DAPT as above -Cardiology consulted as above -Hold metoprolol as above -repeat EKG and trop if develops chest pain  HLD Lipid panel 10/25/2019. Tot 139. HDL 43. LDL 74. -Increase home statin to lipitor 80mg  as above  HTN BP 157/90. Present with a headache and left arm pain in the setting of acute CVA. Will allow for permissive HTN up to 220/120.  -Hold metoprolol as above  Thrombocytosis. Chronic. Plt 145. Home medications are: Hydroxyurea 500mg  daily, ASA 325mg   -Continue hydroxyurea -Start DAPT -Monitor on CBC  Macrocytic Anemia. Chronic. Stable. Hgb 11.5. At baseline. MCV 104. 4. B12 lvl 1058. Not likely alcohol related due to lack of history. -Monitor on CBC -Continue b12 supp  Neutrophilia. Likely Chronic. WBC 18.3. ANC 14,500. Patient is afebrile. Could be due to acute infection.  -Continue to monitor on CBC  FEN/GI: Miralax Prophylaxis: Lovenox  Disposition: Home pending further medical work up  Subjective:  Having some right lower belly discomfort and continues to have weakness upon standing. Fall off to the right when walking  Objective: Temp:  [97.5 F (36.4 C)-98.2 F (36.8 C)] 98 F (36.7 C) (04/09 0734) Pulse Rate:  [72-93] 83 (04/09 0734) Resp:  [16-25] 16 (04/09 0734) BP: (112-169)/(65-96) 157/90 (04/09 0734) SpO2:  [95 %-100 %] 95 % (04/09 0734)  Physical Exam:  General: Appears unwell, no acute distress. Age appropriate. Cardiac: RRR, normal heart sounds,  no murmurs Respiratory: CTAB, normal effort  Abdomen: soft, nontender, non distended,  +BS Extremities: 5/5 strength BLE. RLE edema with ecchymosis surrounding wound site Neuro: alert and oriented, no focal deficits. Gait not assessed at this time.   Laboratory: Recent Labs  Lab 11/24/19 1058 11/25/19 0706 11/26/19 0418  WBC 22.3* 18.3* 18.7*  HGB 11.9 11.6* 11.5*  HCT 36.0 37.6 35.6*  PLT 169 157 145*   Recent Labs  Lab 11/24/19 1058 11/25/19 0706 11/26/19 0418  NA 142 141 142  K 5.1 4.5 4.2  CL 103 104 106  CO2 22 25 22   BUN 17 12 12   CREATININE 1.24* 1.15* 1.20*  CALCIUM 9.5 9.4 8.8*  PROT 7.5 7.5 7.2  BILITOT 0.5 0.8 1.0  ALKPHOS 109 76 76  ALT 14 15 15   AST 27 27 21   GLUCOSE 114* 103* 85    Imaging/Diagnostic Tests: PORTABLE CHEST 1 VIEW COMPARISON: 11/07/2019 IMPRESSION: No evidence of acute disease.  CT ANGIOGRAPHY HEAD AND NECK COMPARISON: None. IMPRESSION: No acute intracranial abnormality. Moderate to marked chronic microvascular ischemic changes. No large vessel occlusion, hemodynamically significant stenosis, or evidence of dissection. 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  MRI HEAD WITHOUT CONTRAST COMPARISON: 11/25/2019 CTA head and neck. 06/27/2012 MRI head. IMPRESSION: Scattered acute bilateral cerebral and cerebellar infarcts involving the corpus callosum genu, bilateral basal ganglia and left thalamus, likely embolic. Small left frontal convexity subdural hematoma, likely subacute on chronic. Extensive chronic microvascular ischemic changes.  Gerlene Fee, DO 11/26/2019, 8:00 AM PGY-1, [place] Intern pager: [phone], text pages welcome

## 2019-11-26 NOTE — Consult Note (Signed)
ELECTROPHYSIOLOGY CONSULT NOTE  [name] MRN: [id number], DOB/AGE: 80/19/1942   Admit date: 11/25/2019 Date of Consult: 11/26/2019  Primary Physician: [name], MD Primary Cardiologist: RR Reason for Consultation: Cryptogenic stroke; recommendations regarding Implantable Loop Recorder  History of Present Illness  EP has been asked to evaluate Yolanda Flowers for placement of an implantable loop recorder to monitor for atrial fibrillation by Dr Erlinda Hong.  The patient was admitted on 11/25/2019 for ? Stroke and elevated Tn   she first developed symptoms while at home w complaints of leg weakness and LA weak.  She had seen RR the day before with complaints of tachypalpitations.    Recent CABG complicated by cellulitis  MRI Imaging demonstrated bilateral scattered infarcts most likely embolic.  "sall left frontal convexity subdural hematoma, likely subacute on chronic."  Was on ASA 325>> added plavix  .  she has undergone workup for stroke including echocardiogram and carotid dopplers.  The patient has been monitored on telemetry which has demonstrated sinus rhythm with no arrhythmias.   Echocardiogram this admission demonstrated EF 65-70% with normal LA size  Lab work is reviewed.  Prior to admission, the patient denies chest pain, shortness of breath, dizziness, palpitations, or syncope.  They are recovering from their stroke with plans to go to CIR at discharge.  Has essential thrombocytosis on Hydroxyrurea    Past Medical History:  Diagnosis Date  . Anxiety   . Arthritis   . Asthma   . Bilateral edema of lower extremity   . Complication of anesthesia    difficulty waking  . Congestive heart failure (CHF) ([place])   . COPD (chronic obstructive pulmonary disease) ([place])   . Coronary artery disease   . Dyspnea on exertion   . GERD (gastroesophageal reflux disease)   . History of melanoma excision    left greast toe 2015  . History of squamous cell carcinoma excision    face--  multiple excisions  . History of subdural hematoma    2008  . Hypertension   . OSA (obstructive sleep apnea)    intolerant  . Thrombocytosis ([place])    takes hydoxyurea--  MONITORED BY DR Hinton Rao [facility])  . VIN III (vulvar intraepithelial neoplasia III)   . Wears dentures    UPPER AND LOWER PARTIAL  . Wears glasses      Surgical History:  Past Surgical History:  Procedure Laterality Date  . ABDOMINAL HYSTERECTOMY  age 54  . CARDIAC CATHETERIZATION  03/[id number]  . CORONARY ARTERY BYPASS GRAFT N/A 11/04/2019   Procedure: CORONARY ARTERY BYPASS GRAFTING (CABG) x 4, with ENDOSCOPIC HARVESTING OF RIGHT GREATER SAPHENOUS VEIN.;  Surgeon: [place]e Pollack, MD;  Location: [place] OR;  Service: Open Heart Surgery;  Laterality: N/A;  . INTRAVASCULAR ULTRASOUND/IVUS N/A 11/03/2019   Procedure: Intravascular Ultrasound/IVUS;  Surgeon: Nelva Bush, MD;  Location: [place] [facility];  Service: Cardiovascular;  Laterality: N/A;  . KNEE ARTHROSCOPY Left 2004  . LEFT HEART CATH AND CORONARY ANGIOGRAPHY N/A 11/03/2019   Procedure: LEFT HEART CATH AND CORONARY ANGIOGRAPHY;  Surgeon: Nelva Bush, MD;  Location: [place] [facility];  Service: Cardiovascular;  Laterality: N/A;  . MELANOMA EXCISION  2015   left great toe  . REPAIR PERONEAL TENDONS ANKLE  2004  . SUBDURAL HEMATOMA EVACUATION VIA CRANIOTOMY  2008      week later  post-op  [facility]  . TEE WITHOUT CARDIOVERSION N/A 11/04/2019   Procedure: TRANSESOPHAGEAL ECHOCARDIOGRAM (TEE);  Surgeon: [place]e Pollack, MD;  Location: [place];  Service: Open Heart Surgery;  Laterality: N/A;  . VULVECTOMY N/A 10/24/2015   Procedure: WIDE LOCAL EXCISION OF THE VULVA ;  Surgeon: Everitt Amber, MD;  Location: [place];  Service: Gynecology;  Laterality: N/A;     Medications Prior to Admission  Medication Sig Dispense Refill Last Dose  . Ascorbic Acid (VITAMIN C) 1000 MG tablet Take 1,000 mg by mouth daily.   11/24/2019 at  Unknown time  . aspirin EC 325 MG EC tablet Take 1 tablet (325 mg total) by mouth daily.   11/24/2019 at Unknown time  . atorvastatin (LIPITOR) 40 MG tablet Take 1 tablet (40 mg total) by mouth daily at 6 PM. 30 tablet 1 11/24/2019 at Unknown time  . Calcium Carb-Cholecalciferol (CALCIUM 600 + D PO) Take 1 tablet by mouth in the morning and at bedtime.    11/24/2019 at Unknown time  . doxycycline (VIBRAMYCIN) 100 MG capsule Take 100 mg by mouth See admin instructions. Take 2 capsules on day 1, then 1 capsule daily.   11/24/2019 at Unknown time  . erythromycin ophthalmic ointment Place 1 application into both eyes daily as needed (pain/ irritation).    Past Month at Unknown time  . ferrous Q000111Q C-folic acid (TRINSICON / FOLTRIN) capsule Take 1 capsule by mouth 2 (two) times daily after a meal. 60 capsule 1 11/24/2019 at Unknown time  . fluticasone (FLONASE) 50 MCG/ACT nasal spray Place 2 sprays into both nostrils at bedtime.    Past Month at Unknown time  . hydroxyurea (HYDREA) 500 MG capsule Take 500 mg by mouth daily.    11/24/2019 at Unknown time  . lansoprazole (PREVACID) 30 MG capsule Take 30 mg by mouth every morning.    11/24/2019 at Unknown time  . Magnesium 250 MG TABS Take 250 mg by mouth at bedtime.    11/24/2019 at Unknown time  . metoprolol tartrate (LOPRESSOR) 25 MG tablet Take 1 tablet (25 mg total) by mouth 2 (two) times daily. 30 tablet 1 11/24/2019 at 1830  . Multiple Vitamins-Minerals (PRESERVISION AREDS 2) CAPS Take 1 capsule by mouth in the morning and at bedtime.   11/24/2019 at Unknown time  . mupirocin ointment (BACTROBAN) 2 % Apply 1 application topically daily as needed (after cancer removal procedures).    unk  . promethazine (PHENERGAN) 12.5 MG tablet Take 12.5 mg by mouth 2 (two) times daily as needed for nausea or vomiting.   11/24/2019 at Unknown time  . traMADol (ULTRAM) 50 MG tablet Take 50 mg by mouth every 6 (six) hours as needed for moderate pain. Up to 7 days   11/24/2019  at Unknown time  . Vitamin D, Ergocalciferol, (DRISDOL) 1.25 MG (50000 UT) CAPS capsule Take 50,000 Units by mouth every 14 (fourteen) days.   Past Month at Unknown time  . ammonium lactate (LAC-HYDRIN) 12 % lotion Apply 1 application topically daily as needed (skin spots).      . [EXPIRED] ciprofloxacin (CIPRO) 250 MG tablet Take 1 tablet (250 mg total) by mouth 2 (two) times daily for 10 days. (Patient not taking: Reported on 11/25/2019) 20 tablet 0 Not Taking at Unknown time  . fluconazole (DIFLUCAN) 150 MG tablet Take 1 tablet by mouth once.       Inpatient Medications:  . aspirin EC  81 mg Oral Daily  . atorvastatin  80 mg Oral q1800  . clopidogrel  75 mg Oral Daily  . doxycycline  100 mg Oral BID  . enoxaparin (LOVENOX) injection  40 mg Subcutaneous Q24H  . ferrous Q000111Q C-folic acid  1 capsule Oral BID PC  . hydroxyurea  500 mg Oral Daily  . pantoprazole  40 mg Oral Daily    Allergies:  Allergies  Allergen Reactions  . Cephalexin Hives  . Ciprofloxacin Other (See Comments)    Gi intolerance  . Penicillins Rash    Childhood reaction Did it involve swelling of the face/tongue/throat, SOB, or low BP? No Did it involve sudden or severe rash/hives, skin peeling, or any reaction on the inside of your mouth or nose? No Did you need to seek medical attention at a hospital or doctor's office? No When did it last happen?50 years ago If all above answers are "NO", may proceed with cephalosporin use.     Social History   Socioeconomic History  . Marital status: Married    Spouse name: Not on file  . Number of children: Not on file  . Years of education: Not on file  . Highest education level: Not on file  Occupational History  . Not on file  Tobacco Use  . Smoking status: Never Smoker  . Smokeless tobacco: Never Used  Substance and Sexual Activity  . Alcohol use: No  . Drug use: No  . Sexual activity: Not on file  Other Topics Concern  . Not on file    Social History Narrative  . Not on file   Social Determinants of Health   Financial Resource Strain:   . Difficulty of Paying Living Expenses:   Food Insecurity:   . Worried About Charity fundraiser in the Last Year:   . Arboriculturist in the Last Year:   Transportation Needs:   . Film/video editor (Medical):   Marland Kitchen Lack of Transportation (Non-Medical):   Physical Activity:   . Days of Exercise per Week:   . Minutes of Exercise per Session:   Stress:   . Feeling of Stress :   Social Connections:   . Frequency of Communication with Friends and Family:   . Frequency of Social Gatherings with Friends and Family:   . Attends Religious Services:   . Active Member of Clubs or Organizations:   . Attends Archivist Meetings:   Marland Kitchen Marital Status:   Intimate Partner Violence:   . Fear of Current or Ex-Partner:   . Emotionally Abused:   Marland Kitchen Physically Abused:   . Sexually Abused:      Family History  Problem Relation Age of Onset  . Clotting disorder Mother   . Hypertension Mother   . Heart disease Mother   . Arthritis Mother   . Stroke Father   . Hypertension Father   . Heart disease Father   . Arthritis Father   . Emphysema Father   . Asthma Father   . Kidney failure Father   . Hypertension Sister   . Heart disease Sister   . Arthritis Sister   . Hypertension Brother   . Heart disease Brother   . Arthritis Brother       Review of Systems: All other systems reviewed and are otherwise negative except as noted above.  Physical Exam: Vitals:   11/26/19 0036 11/26/19 0428 11/26/19 0734 11/26/19 1258  BP: 136/81 (!) 152/78 (!) 157/90 (!) 141/73  Pulse: 86 82 83 97  Resp: 20 16 16 16   Temp: (!) 97.5 F (36.4 C) 98 F (36.7 C) 98 F (  36.7 C) 98.3 F (36.8 C)  TempSrc: Oral Oral Oral Oral  SpO2: 95% 96% 95% 96%  Weight:      Height:        GEN- The patient is well appearing, alert and oriented x 3 today.   Head- normocephalic, atraumatic Eyes-   Sclera clear, conjunctiva pink Ears- hearing intact Oropharynx- clear Neck- supple Lungs- Clear to ausculation bilaterally, normal work of breathing Heart- Regular rate and rhythm, no murmurs, rubs or gallops  GI- soft, NT, ND, + BS Extremities- no clubbing, cyanosis, or edema MS- no significant deformity or atrophy Skin- no rash or lesion Psych- euthymic mood, full affect   Labs:   Lab Results  Component Value Date   WBC 18.7 (H) 11/26/2019   HGB 11.5 (L) 11/26/2019   HCT 35.6 (L) 11/26/2019   MCV 100.0 11/26/2019   PLT 145 (L) 11/26/2019    Recent Labs  Lab 11/26/19 0418  NA 142  K 4.2  CL 106  CO2 22  BUN 12  CREATININE 1.20*  CALCIUM 8.8*  PROT 7.2  BILITOT 1.0  ALKPHOS 76  ALT 15  AST 21  GLUCOSE 85     Radiology/Studies: CT Angio Head W or Wo Contrast  Result Date: 11/25/2019 CLINICAL DATA:  Sudden onset headache, left-sided vision loss and weakness EXAM: CT ANGIOGRAPHY HEAD AND NECK TECHNIQUE: Multidetector CT imaging of the head and neck was performed using the standard protocol during bolus administration of intravenous contrast. Multiplanar CT image reconstructions and MIPs were obtained to evaluate the vascular anatomy. Carotid stenosis measurements (when applicable) are obtained utilizing NASCET criteria, using the distal internal carotid diameter as the denominator. CONTRAST:  53mL OMNIPAQUE IOHEXOL 350 MG/ML SOLN COMPARISON:  None. FINDINGS: CT HEAD Brain: There is no acute intracranial hemorrhage, mass effect, or edema. Gray-white differentiation is preserved. Confluent areas of hypoattenuation in the supratentorial white matter are nonspecific but may reflect moderate to marked chronic microvascular ischemic changes. Ventricles and sulci are within normal limits in size and configuration. Vascular: No hyperdense vessel. Intracranial atherosclerotic calcification at the skull base. Skull: Prior left frontoparietal craniotomy. Sinuses/Orbits: No acute finding.  Other: None. Review of the MIP images confirms the above findings CTA NECK Aortic arch: Mild calcified plaque along the aortic arch and great vessel origins, which are patent. Right carotid system: Patent. Mild calcified plaque along the proximal ICA without measurable stenosis. Left carotid system: Patent. Mild primarily calcified plaque along the proximal ICA without measurable stenosis. Vertebral arteries: Patent and codominant. Skeleton: Degenerative changes of the cervical spine. Degenerative changes of the temporomandibular joints. Other neck: No mass or adenopathy. Upper chest: No apical lung mass. Review of the MIP images confirms the above findings CTA HEAD Anterior circulation: Intracranial internal carotid arteries are patent with mild calcified plaque. There is a 2 x 1.6 mm inferomedially directed outpouching from the supraclinoid left ICA. Anterior and middle cerebral arteries are patent. Posterior circulation: Intracranial vertebral arteries, basilar artery, and posterior cerebral arteries are patent. Venous sinuses: As permitted by contrast timing, patent. Review of the MIP images confirms the above findings IMPRESSION: No acute intracranial abnormality. Moderate to marked chronic microvascular ischemic changes. No large vessel occlusion, hemodynamically significant stenosis, or evidence of dissection. 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 Electronically Signed   By: Macy Mis M.D.   On: 11/25/2019 08:54   CT Angio Neck W and/or Wo Contrast  Result Date: 11/25/2019 CLINICAL DATA:  Sudden  onset headache, left-sided vision loss and weakness EXAM: CT ANGIOGRAPHY HEAD AND NECK TECHNIQUE: Multidetector CT imaging of the head and neck was performed using the standard protocol during bolus administration of intravenous contrast. Multiplanar CT image reconstructions and MIPs were obtained to evaluate the vascular anatomy. Carotid stenosis  measurements (when applicable) are obtained utilizing NASCET criteria, using the distal internal carotid diameter as the denominator. CONTRAST:  52mL OMNIPAQUE IOHEXOL 350 MG/ML SOLN COMPARISON:  None. FINDINGS: CT HEAD Brain: There is no acute intracranial hemorrhage, mass effect, or edema. Gray-white differentiation is preserved. Confluent areas of hypoattenuation in the supratentorial white matter are nonspecific but may reflect moderate to marked chronic microvascular ischemic changes. Ventricles and sulci are within normal limits in size and configuration. Vascular: No hyperdense vessel. Intracranial atherosclerotic calcification at the skull base. Skull: Prior left frontoparietal craniotomy. Sinuses/Orbits: No acute finding. Other: None. Review of the MIP images confirms the above findings CTA NECK Aortic arch: Mild calcified plaque along the aortic arch and great vessel origins, which are patent. Right carotid system: Patent. Mild calcified plaque along the proximal ICA without measurable stenosis. Left carotid system: Patent. Mild primarily calcified plaque along the proximal ICA without measurable stenosis. Vertebral arteries: Patent and codominant. Skeleton: Degenerative changes of the cervical spine. Degenerative changes of the temporomandibular joints. Other neck: No mass or adenopathy. Upper chest: No apical lung mass. Review of the MIP images confirms the above findings CTA HEAD Anterior circulation: Intracranial internal carotid arteries are patent with mild calcified plaque. There is a 2 x 1.6 mm inferomedially directed outpouching from the supraclinoid left ICA. Anterior and middle cerebral arteries are patent. Posterior circulation: Intracranial vertebral arteries, basilar artery, and posterior cerebral arteries are patent. Venous sinuses: As permitted by contrast timing, patent. Review of the MIP images confirms the above findings IMPRESSION: No acute intracranial abnormality. Moderate to marked  chronic microvascular ischemic changes. No large vessel occlusion, hemodynamically significant stenosis, or evidence of dissection. 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 Electronically Signed   By: Macy Mis M.D.   On: 11/25/2019 08:54   MR BRAIN WO CONTRAST  Result Date: 11/25/2019 CLINICAL DATA:  Focal neural deficit, greater than 6 hours, stroke suspected. Progressive weakness for 2 weeks. Headache and left arm weakness. EXAM: MRI HEAD WITHOUT CONTRAST TECHNIQUE: Multiplanar, multiecho pulse sequences of the brain and surrounding structures were obtained without intravenous contrast. COMPARISON:  11/25/2019 CTA  head and neck.  06/27/2012 MRI head. FINDINGS: Brain: Sequela of prior left frontoparietal craniotomy. Scattered tiny and small foci of restricted diffusion involving the bilateral centrum semiovale, periventricular white matter, right frontal subcortical white matter, corpus callosum genu, right caudate head, left caudate body/tail, left thalamus, right temporal and occipital cortices and bilateral cerebellar hemispheres. Extensive background scattered and confluent T2/FLAIR hyperintense foci involving the periventricular and deep white matter likely reflect chronic microvascular ischemic changes. Tiny left cerebellar focus of hemosiderin deposition. No midline shift or ventriculomegaly. FLAIR hyperintense subdural collection measuring up to 4 mm with layering T1 hyperintensity overlying the left frontal convexity (1:17). Vascular: Normal flow voids. 2 mm left supraclinoid ICA aneurysm seen on recent CTA is not demonstrated on the current exam. Skull and upper cervical spine: Normal marrow signal. Sinuses/Orbits: Sequela of bilateral lens replacement. Mild ethmoid sinus mucosal thickening. Other: None. IMPRESSION: Scattered acute bilateral cerebral and cerebellar infarcts involving the corpus callosum genu, bilateral basal ganglia and  left thalamus, likely embolic. Small left frontal convexity subdural hematoma,  likely subacute on chronic. Extensive chronic microvascular ischemic changes. These results were called by telephone at the time of interpretation on 11/25/2019 at 1:20 pm to provider [facility] , who verbally acknowledged these results. Electronically Signed   By: Primitivo Gauze M.D.   On: 11/25/2019 13:22   CARDIAC CATHETERIZATION  Result Date: 11/03/2019 Conclusions: 1. Multivessel coronary artery disease with heavy calcification of the major coronary arteries.  There is a 60% eccentric stenosis involving the distal LMCA that extends into the ostial LCx where there is at least 80% narrowing.  There is 50% stenosis of the mid LAD as well as chronic total occlusions of D1 and D2. 2. Hyperdynamic left ventricular systolic function with normal filling pressure. Recommendations: 1. Inpatient cardiac surgery consultation for management of accelerating angina, including chest pain during engagement of the left coronary artery.  While the LMCA stenosis is not critical by angiography or IVUS (MLA at least 8-10 mm^2), the lesion leads to severe stenosis of the ostial LCx.  The LCx stenosis cannot be treated percutaneously without involving the LMCA/LAD. 2. Start IV heparin 2 hours after deflation of TR band. 3. Obtain transthoracic echocardiogram. 4. Aggressive secondary prevention, including high-intensity statin therapy. 5. Start low-dose beta blocker for antianginal therapy and blood pressure control. Nelva Bush, MD [facility]   DG Chest Portable 1 View  Result Date: 11/25/2019 CLINICAL DATA:  Weakness EXAM: PORTABLE CHEST 1 VIEW COMPARISON:  11/07/2019 FINDINGS: Cardiomegaly. CABG. There is no edema, consolidation, effusion, or pneumothorax. Implantable loop recorder. Artifact from EKG leads. IMPRESSION: No evidence of acute disease. Electronically Signed   By: Monte Fantasia M.D.   On: 11/25/2019 07:43   DG Chest  Port 1 View  Result Date: 11/07/2019 CLINICAL DATA:  Pleural effusion. EXAM: PORTABLE CHEST 1 VIEW COMPARISON:  Chest radiograph 11/06/2019 FINDINGS: A right IJ approach introducer sheath remains in position. Multiple previously demonstrated chest tubes have been removed. No evidence of pneumothorax. Prior median sternotomy/CABG. The cardiomediastinal silhouette is unchanged. Aortic atherosclerosis. Unchanged appearance of left basilar atelectasis with possible trace left pleural effusion. The lungs are otherwise clear. IMPRESSION: Multiple previously demonstrated chest tubes have been removed. No evidence of pneumothorax Unchanged position of a right IJ approach introducer sheath. Similar appearance of left basilar atelectasis with possible trace left pleural effusion. Electronically Signed   By: Kellie Simmering DO   On: 11/07/2019 08:58   DG Chest Port 1 View  Result Date: 11/06/2019 CLINICAL DATA:  History of CABG. EXAM: PORTABLE CHEST 1 VIEW COMPARISON:  Chest radiograph 11/05/2019 FINDINGS: Interval removal of a previously demonstrated Swan-Ganz catheter. The right IJ approach introducer sheath remains in position. Prior CABG. Unchanged position of multiple chest tubes. The cardiomediastinal silhouette is unchanged. Aortic atherosclerosis. Persistent left basilar atelectasis and possible small effusion. No evidence of pneumothorax IMPRESSION: Interval removal of a Swan-Ganz catheter. Support apparatus otherwise unchanged. Unchanged left basilar atelectasis with possible small left pleural effusion. Aortic atherosclerosis. Electronically Signed   By: Kellie Simmering DO   On: 11/06/2019 09:30   DG Chest Port 1 View  Result Date: 11/05/2019 CLINICAL DATA:  Chest tube present  S/p CABG EXAM: PORTABLE CHEST 1 VIEW COMPARISON:  Radiograph 11/04/2019 FINDINGS: Interval extubation and removal of NG tube. No increase in atelectasis. Persistent LEFT basilar atelectasis and effusion. Bilateral chest tubes in place.   No appreciable pneumothorax. Mediastinal drains and Swan-Ganz catheter unchanged IMPRESSION: 1. Extubation without complication. 2. Persistent LEFT basilar atelectasis and effusion. 3. Bilateral chest tubes in place without  appreciable pneumothorax. Electronically Signed   By: Suzy Bouchard M.D.   On: 11/05/2019 09:09   DG Chest Port 1 View  Result Date: 11/04/2019 CLINICAL DATA:  Hypoxia EXAM: PORTABLE CHEST 1 VIEW COMPARISON:  November 03, 2019 FINDINGS: Patient is status post coronary artery bypass grafting. Endotracheal tube tip is 2.9 cm above the carina. Nasogastric tube tip and side port are below the diaphragm. Swan-Ganz catheter tip is in the proximal right main pulmonary artery. There is a chest tube on each side as well as a mediastinal drain. There is a minimal left apical pneumothorax. There is atelectatic change in the right upper lobe and right mid lung regions. There is also mild bibasilar atelectasis. There is calcification in the mitral annulus. There is aortic atherosclerosis. No adenopathy. No bone lesions. IMPRESSION: Tube and catheter positions as described. Small left apical pneumothorax without tension component. Areas of scattered atelectatic change in the right upper lobe, right mid lung, and left base. Postoperative changes. Stable cardiac size. Aortic Atherosclerosis (ICD10-I70.0). Electronically Signed   By: Lowella Grip III M.D.   On: 11/04/2019 13:42   DG Chest Port 1 View  Result Date: 11/03/2019 CLINICAL DATA:  Preop respiratory exam EXAM: PORTABLE CHEST 1 VIEW COMPARISON:  Chest radiograph 10/25/2019 at [place]: The cardiomediastinal contours are unchanged. Mild cardiomegaly with aortic atherosclerosis. Pulmonary vasculature is normal. No consolidation, pleural effusion, or pneumothorax. No acute osseous abnormalities are seen. IMPRESSION: Mild cardiomegaly with aortic atherosclerosis. No congestive failure or acute pulmonary process. Aortic  Atherosclerosis (ICD10-I70.0). Electronically Signed   By: Keith Rake M.D.   On: 11/03/2019 19:54   ECHOCARDIOGRAM COMPLETE  Result Date: 11/03/2019    ECHOCARDIOGRAM REPORT   Patient Name:   SHANELE BELONGIA Date of Exam: 11/03/2019 Medical Rec #:  [id number]      Height:       65.0 in Accession #:    [id number]     Weight:       172.0 lb Date of Birth:  1941-05-13     BSA:          1.855 m Patient Age:    64 years       BP:           141/81 mmHg Patient Gender: F              HR:           75 bpm. Exam Location:  Inpatient Procedure: 2D Echo Indications:    coronary artery disease  History:        Patient has no prior history of Echocardiogram examinations.                 Signs/Symptoms:Chest Pain.  Sonographer:    Johny Chess Referring Phys: [id number] BRYAN K [place]  1. Left ventricular ejection fraction, by estimation, is 65 to 70%. The left ventricle has normal function. The left ventricle has no regional wall motion abnormalities. There is mild left ventricular hypertrophy. Left ventricular diastolic parameters are indeterminate.  2. Right ventricular systolic function is normal. The right ventricular size is normal. There is mildly elevated pulmonary artery systolic pressure. The estimated right ventricular systolic pressure is Q000111Q mmHg.  3. The mitral valve is abnormal. Moderate mitral annular calcification. No evidence of mitral valve regurgitation.  4. The aortic valve is tricuspid. Aortic valve regurgitation is not visualized. Mild aortic valve sclerosis is present, with no evidence of aortic valve stenosis.  5. The inferior vena cava  is normal in size with greater than 50% respiratory variability, suggesting right atrial pressure of 3 mmHg. FINDINGS  Left Ventricle: Left ventricular ejection fraction, by estimation, is 65 to 70%. The left ventricle has normal function. The left ventricle has no regional wall motion abnormalities. The left ventricular internal cavity size was normal  in size. There is  mild left ventricular hypertrophy. Left ventricular diastolic parameters are indeterminate. Right Ventricle: The right ventricular size is normal. No increase in right ventricular wall thickness. Right ventricular systolic function is normal. There is mildly elevated pulmonary artery systolic pressure. The tricuspid regurgitant velocity is 2.78  m/s, and with an assumed right atrial pressure of 3 mmHg, the estimated right ventricular systolic pressure is Q000111Q mmHg. Left Atrium: Left atrial size was normal in size. Right Atrium: Right atrial size was normal in size. Pericardium: There is no evidence of pericardial effusion. Mitral Valve: The mitral valve is abnormal. Moderate mitral annular calcification. No evidence of mitral valve regurgitation. Tricuspid Valve: The tricuspid valve is normal in structure. Tricuspid valve regurgitation is trivial. Aortic Valve: The aortic valve is tricuspid. Aortic valve regurgitation is not visualized. Mild aortic valve sclerosis is present, with no evidence of aortic valve stenosis. Pulmonic Valve: The pulmonic valve was not well visualized. Pulmonic valve regurgitation is not visualized. Aorta: The aortic root and ascending aorta are structurally normal, with no evidence of dilitation. Venous: The inferior vena cava is normal in size with greater than 50% respiratory variability, suggesting right atrial pressure of 3 mmHg. IAS/Shunts: The interatrial septum was not well visualized.  LEFT VENTRICLE PLAX 2D LVIDd:         4.48 cm  Diastology LVIDs:         2.99 cm  LV e' lateral:   8.70 cm/s LV PW:         1.23 cm  LV E/e' lateral: 10.7 LV IVS:        0.97 cm  LV e' medial:    8.16 cm/s LVOT diam:     1.90 cm  LV E/e' medial:  11.4 LV SV:         74 LV SV Index:   40 LVOT Area:     2.84 cm  RIGHT VENTRICLE RV S prime:     15.90 cm/s TAPSE (M-mode): 2.5 cm LEFT ATRIUM           Index       RIGHT ATRIUM           Index LA diam:      4.00 cm 2.16 cm/m  RA Area:      16.90 cm LA Vol (A4C): 47.0 ml 25.33 ml/m RA Volume:   45.40 ml  24.47 ml/m  AORTIC VALVE LVOT Vmax:   124.00 cm/s LVOT Vmean:  87.900 cm/s LVOT VTI:    0.261 m  AORTA Ao Root diam: 2.80 cm Ao Asc diam:  2.80 cm MITRAL VALVE                TRICUSPID VALVE MV Area (PHT): 2.29 cm     TR Peak grad:   30.9 mmHg MV Decel Time: 331 msec     TR Vmax:        278.00 cm/s MV E velocity: 93.40 cm/s MV A velocity: 144.00 cm/s  SHUNTS MV E/A ratio:  0.65         Systemic VTI:  0.26 m  Systemic Diam: 1.90 cm Oswaldo Milian MD Electronically signed by Oswaldo Milian MD Signature Date/Time: 11/03/2019/6:00:14 PM    Final    ECHO INTRAOPERATIVE TEE  Result Date: 11/04/2019  *INTRAOPERATIVE TRANSESOPHAGEAL REPORT *  Patient Name:   DIAVIAN VANDERJAGT Date of Exam: 11/04/2019 Medical Rec #:  [id number]      Height:       65.0 in Accession #:    [id number]     Weight:       170.1 lb Date of Birth:  Dec 21, 1940     BSA:          1.85 m Patient Age:    28 years       BP:           136/78 mmHg Patient Gender: F              HR:           65 bpm. Exam Location:  Anesthesiology Transesophogeal exam was perform intraoperatively during surgical procedure. Patient was closely monitored under general anesthesia during the entirety of examination. Indications:     Coronary artery disease Sonographer:     Vickie Epley RDCS Performing Phys: [id number] Diagnosing Phys: Annye Asa MD Complications: No known complications during this procedure. POST-OP IMPRESSIONS - Left Ventricle: The left ventricle is essentially unchanged from pre-bypass. Approximate EF 55-60%. There are no regional wall motion abnormalities. - Aortic Valve: The aortic valve appears unchanged from pre-bypass. - Mitral Valve: The mitral valve appears unchanged from pre-bypass. There is mild regurgitation. PRE-OP FINDINGS  Left Ventricle: The left ventricle has normal systolic function, with an ejection fraction of 60-65%,  measures 65%. The cavity size was normal. There is no increase in left ventricular wall thickness. No evidence of left ventricular regional wall motion  abnormalities. Right Ventricle: The right ventricle has normal systolic function. The cavity was normal. There is no increase in right ventricular wall thickness. Left Atrium: Left atrial size was normal in size. The left atrial appendage is well visualized and there is no evidence of thrombus present. Left atrial appendage velocity is normal at greater than 40 cm/s. Right Atrium: Right atrial size was mildly dilated. Right atrial pressure is estimated at 10 mmHg. Interatrial Septum: No atrial level shunt detected by color flow Doppler. Pericardium: There is no evidence of pericardial effusion. Mitral Valve: The mitral valve is normal in structure. Mild thickening of the mitral valve leaflet. Mild calcification of the mitral valve annulus. Mitral valve regurgitation is mild by color flow Doppler. No evidence of mitral stenosis, with peak gradient 4 mmHg, mean gradient 1 mmHg. Tricuspid Valve: The tricuspid valve was normal in structure. Tricuspid valve regurgitation is mild by color flow Doppler. There is no evidence of tricuspid valve vegetation. Aortic Valve: The aortic valve is tricuspid. Aortic valve regurgitation was not visualized by color flow Doppler.There is no stenosis of the aortic valve, with peak gradient 9 mmHg, mean gradient 3 mmHg.Marland Kitchen There is no evidence of a vegetation on the aortic valve. Pulmonic Valve: The pulmonic valve was normal in structure, with normal excursion. Pulmonic valve regurgitation is trivial, around the PA catheter, by color flow Doppler. Aorta: The aortic arch was not well visualized. 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 Pulmonary Artery: Gordy Councilman catheter present on the right. The pulmonary artery is of normal size. Venous: The inferior vena cava is normal  in size with greater than 50%  respiratory variability, suggesting right atrial pressure of 3 mmHg. +-------------+--------++ AORTIC VALVE          +-------------+--------++ AV Mean Grad:3.0 mmHg +-------------+--------++ +-------------+--------++ MITRAL VALVE          +-------------+--------++ MV Mean grad:1.0 mmHg +-------------+--------++  Annye Asa MD Electronically signed by Annye Asa MD Signature Date/Time: 11/04/2019/6:24:22 PM    Final    VAS US DOPPLER PRE CABG  Result Date: 11/04/2019 PREOPERATIVE VASCULAR EVALUATION  Indications:      Pre-CABG. Risk Factors:     Hypertension, coronary artery disease. Limitations:      TR band right wrist Comparison Study: No prior study Performing Technologist: Maudry Mayhew MHA, RDMS, RVT, RDCS Supporting Technologist: Baldwin Crown RDMS, RVT  Examination Guidelines: A complete evaluation includes B-mode imaging, spectral Doppler, color Doppler, and power Doppler as needed of all accessible portions of each vessel. Bilateral testing is considered an integral part of a complete examination. Limited examinations for reoccurring indications may be performed as noted.  Right Carotid Findings: +----------+-------+-------+--------+---------------------------------+--------+           PSV    EDV    StenosisDescribe                         Comments           cm/s   cm/s                                                     +----------+-------+-------+--------+---------------------------------+--------+ CCA Prox  91     21             smooth and heterogenous                   +----------+-------+-------+--------+---------------------------------+--------+ CCA Distal91     26             heterogenous, smooth and calcific         +----------+-------+-------+--------+---------------------------------+--------+ ICA Prox  77     20             heterogenous, smooth and calcific          +----------+-------+-------+--------+---------------------------------+--------+ ICA Distal99     32                                                       +----------+-------+-------+--------+---------------------------------+--------+ ECA       90     10             irregular, heterogenous and                                               calcific                                  +----------+-------+-------+--------+---------------------------------+--------+ Portions of this table do not appear on this page. +----------+--------+-------+----------------+------------+           PSV cm/sEDV cmsDescribe        Arm  Pressure +----------+--------+-------+----------------+------------+ Subclavian108            Multiphasic, [id number]          +----------+--------+-------+----------------+------------+ +---------+--------+--+--------+--+---------+ VertebralPSV cm/s47EDV cm/s16Antegrade +---------+--------+--+--------+--+---------+ Left Carotid Findings: +----------+-------+-------+--------+---------------------------------+--------+           PSV    EDV    StenosisDescribe                         Comments           cm/s   cm/s                                                     +----------+-------+-------+--------+---------------------------------+--------+ CCA Prox  78     16                                                       +----------+-------+-------+--------+---------------------------------+--------+ CCA Distal82     16             smooth, heterogenous and calcific         +----------+-------+-------+--------+---------------------------------+--------+ ICA Prox  92     20             heterogenous, irregular and                                               calcific                                  +----------+-------+-------+--------+---------------------------------+--------+ ICA Distal77     28                                                        +----------+-------+-------+--------+---------------------------------+--------+ ECA       128    8              irregular, heterogenous and                                               calcific                                  +----------+-------+-------+--------+---------------------------------+--------+ +----------+--------+--------+----------------+------------+ SubclavianPSV cm/sEDV cm/sDescribe        Arm Pressure +----------+--------+--------+----------------+------------+           165             Multiphasic, [id number]          +----------+--------+--------+----------------+------------+ +---------+--------+--+--------+--+---------+ VertebralPSV cm/s69EDV cm/s15Antegrade +---------+--------+--+--------+--+---------+  ABI Findings: +--------+------------------+-----+---------+--------+ Right   Rt Pressure (mmHg)IndexWaveform Comment  +--------+------------------+-----+---------+--------+ [id number]  triphasic         +--------+------------------+-----+---------+--------+ PTA     98                0.73 triphasic         +--------+------------------+-----+---------+--------+ DP      128               0.96 triphasic         +--------+------------------+-----+---------+--------+ +--------+------------------+-----+---------+-------+ Left    Lt Pressure (mmHg)IndexWaveform Comment +--------+------------------+-----+---------+-------+ [id number]                    triphasic        +--------+------------------+-----+---------+-------+ PTA     145               1.08 triphasic        +--------+------------------+-----+---------+-------+ DP      143               1.07 triphasic        +--------+------------------+-----+---------+-------+ +-------+---------------+----------------+ ABI/TBIToday's ABI/TBIPrevious ABI/TBI +-------+---------------+----------------+ Right  0.96                             +-------+---------------+----------------+ Left   1.08                            +-------+---------------+----------------+  Right Doppler Findings: +-----------+--------+-----+---------+--------+ Site       PressureIndexDoppler  Comments +-----------+--------+-----+---------+--------+ Brachial   132          triphasic         +-----------+--------+-----+---------+--------+ Radial                  triphasic         +-----------+--------+-----+---------+--------+ Ulnar                   triphasic         +-----------+--------+-----+---------+--------+ Palmar Arch                      TR band  +-----------+--------+-----+---------+--------+  Left Doppler Findings: +--------+--------+-----+---------+--------+ Site    PressureIndexDoppler  Comments +--------+--------+-----+---------+--------+ [id number]          triphasic         +--------+--------+-----+---------+--------+ Radial               triphasic         +--------+--------+-----+---------+--------+ Ulnar                triphasic         +--------+--------+-----+---------+--------+  Summary: Right Carotid: Velocities in the right ICA are consistent with a 1-39% stenosis. Left Carotid: Velocities in the left ICA are consistent with a 1-39% stenosis. Vertebrals:  Bilateral vertebral arteries demonstrate antegrade flow. Subclavians: Normal flow hemodynamics were seen in bilateral subclavian              arteries. Right ABI: Resting right ankle-brachial index is within normal range. No evidence of significant right lower extremity arterial disease. Left ABI: Resting left ankle-brachial index is within normal range. No evidence of significant left lower extremity arterial disease. Right Upper Extremity: No significant arterial obstruction detected in the right upper extremity. Left Upper Extremity: No significant arterial obstruction detected in the left upper extremity. Normal PPG waveforms with radial  artery compression suggest palmar arch patency. Doppler waveform obliterate with left radial compression. Doppler waveform obliterate with left ulnar compression.  Electronically signed by Servando Snare MD on 11/04/2019 at 5:00:28 PM.    Final    VAS [place] LOWER EXTREMITY VENOUS (DVT)  Result Date: 11/26/2019  Lower Venous DVTStudy Indications: Thrombocytosis.  Risk Factors: Surgery. Comparison Study: No prior studies. Performing Technologist: Oliver Hum RVT  Examination Guidelines: A complete evaluation includes B-mode imaging, spectral Doppler, color Doppler, and power Doppler as needed of all accessible portions of each vessel. Bilateral testing is considered an integral part of a complete examination. Limited examinations for reoccurring indications may be performed as noted. The reflux portion of the exam is performed with the patient in reverse Trendelenburg.  +---------+---------------+---------+-----------+----------+--------------+ RIGHT    CompressibilityPhasicitySpontaneityPropertiesThrombus Aging +---------+---------------+---------+-----------+----------+--------------+ CFV      Full           Yes      Yes                                 +---------+---------------+---------+-----------+----------+--------------+ SFJ      Full                                                        +---------+---------------+---------+-----------+----------+--------------+ FV Prox  Full                                                        +---------+---------------+---------+-----------+----------+--------------+ FV Mid   Full                                                        +---------+---------------+---------+-----------+----------+--------------+ FV DistalFull                                                        +---------+---------------+---------+-----------+----------+--------------+ PFV      Full                                                         +---------+---------------+---------+-----------+----------+--------------+ POP      Full           Yes      Yes                                 +---------+---------------+---------+-----------+----------+--------------+ PTV      Full                                                        +---------+---------------+---------+-----------+----------+--------------+  PERO     Full                                                        +---------+---------------+---------+-----------+----------+--------------+   +---------+---------------+---------+-----------+----------+--------------+ LEFT     CompressibilityPhasicitySpontaneityPropertiesThrombus Aging +---------+---------------+---------+-----------+----------+--------------+ CFV      Full           Yes      Yes                                 +---------+---------------+---------+-----------+----------+--------------+ SFJ      Full                                                        +---------+---------------+---------+-----------+----------+--------------+ FV Prox  Full                                                        +---------+---------------+---------+-----------+----------+--------------+ FV Mid   Full                                                        +---------+---------------+---------+-----------+----------+--------------+ FV DistalFull                                                        +---------+---------------+---------+-----------+----------+--------------+ PFV      Full                                                        +---------+---------------+---------+-----------+----------+--------------+ POP      Full           Yes      Yes                                 +---------+---------------+---------+-----------+----------+--------------+ PTV      Full                                                         +---------+---------------+---------+-----------+----------+--------------+ PERO     Full                                                        +---------+---------------+---------+-----------+----------+--------------+  Summary: RIGHT: - There is no evidence of deep vein thrombosis in the lower extremity.  - No cystic structure found in the popliteal fossa.  LEFT: - There is no evidence of deep vein thrombosis in the lower extremity.  - No cystic structure found in the popliteal fossa.  *See table(s) above for measurements and observations.    Preliminary    ECHOCARDIOGRAM LIMITED  Result Date: 11/25/2019    ECHOCARDIOGRAM LIMITED REPORT   Patient Name:   SUMAR VIRTS Date of Exam: 11/25/2019 Medical Rec #:  [id number]      Height:       66.0 in Accession #:    [id number]     Weight:       162.0 lb Date of Birth:  1941-08-18     BSA:          1.828 m Patient Age:    43 years       BP:           146/92 mmHg Patient Gender: F              HR:           77 bpm. Exam Location:  Inpatient Procedure: Limited Color Doppler, Cardiac Doppler and Limited Echo Indications:    Stroke 434.91 / I163.9  History:        Patient has prior history of Echocardiogram examinations and                 Patient has no prior history of Echocardiogram examinations,                 most recent 11/03/2019. CAD, Prior CABG, COPD,                 Signs/Symptoms:Dyspnea; Risk Factors:Hypertension, Sleep Apnea                 and Former Smoker. GERD.  Sonographer:    Darlina Sicilian RDCS Referring Phys: [id number] [place]  1. Left ventricular ejection fraction, by estimation, is 65 to 70%. The left ventricle has normal function. The left ventricle has no regional wall motion abnormalities. There is mild left ventricular hypertrophy. Left ventricular diastolic parameters are consistent with Grade I diastolic dysfunction (impaired relaxation).  2. Right ventricular systolic function is normal. The right ventricular  size is normal.  3. The mitral valve is normal in structure. No evidence of mitral valve regurgitation. No evidence of mitral stenosis.  4. The aortic valve is tricuspid. Aortic valve regurgitation is not visualized. Mild aortic valve sclerosis is present, with no evidence of aortic valve stenosis.  5. The inferior vena cava is normal in size with greater than 50% respiratory variability, suggesting right atrial pressure of 3 mmHg. Comparison(s): No significant change from prior study. Prior images reviewed side by side. Changes from prior study are noted. Conclusion(s)/Recommendation(s): No intracardiac source of embolism detected on this transthoracic study. A transesophageal echocardiogram is recommended to exclude cardiac source of embolism if clinically indicated. FINDINGS  Left Ventricle: Left ventricular ejection fraction, by estimation, is 65 to 70%. The left ventricle has normal function. The left ventricle has no regional wall motion abnormalities. The left ventricular internal cavity size was normal in size. There is  mild left ventricular hypertrophy. Right Ventricle: The right ventricular size is normal. No increase in right ventricular wall thickness. Right ventricular systolic function is normal. Left Atrium: Left atrial size was normal in size. Right Atrium: Right atrial size  was normal in size. Pericardium: There is no evidence of pericardial effusion. Mitral Valve: The mitral valve is normal in structure. Normal mobility of the mitral valve leaflets. Mild to moderate mitral annular calcification. No evidence of mitral valve stenosis. Tricuspid Valve: The tricuspid valve is normal in structure. Tricuspid valve regurgitation is not demonstrated. No evidence of tricuspid stenosis. Aortic Valve: The aortic valve is tricuspid. Aortic valve regurgitation is not visualized. Mild aortic valve sclerosis is present, with no evidence of aortic valve stenosis. Pulmonic Valve: The pulmonic valve was normal in  structure. Pulmonic valve regurgitation is not visualized. No evidence of pulmonic stenosis. Aorta: The aortic root is normal in size and structure. Venous: The inferior vena cava is normal in size with greater than 50% respiratory variability, suggesting right atrial pressure of 3 mmHg. IAS/Shunts: No atrial level shunt detected by color flow Doppler.  LEFT VENTRICLE PLAX 2D LVIDd:         2.90 cm  Diastology LVIDs:         2.10 cm  LV e' lateral:   4.24 cm/s LV PW:         1.40 cm  LV E/e' lateral: 17.3 LV IVS:        1.50 cm  LV e' medial:    3.05 cm/s LVOT diam:     2.00 cm  LV E/e' medial:  24.0 LV SV:         46 LV SV Index:   25 LVOT Area:     3.14 cm  LEFT ATRIUM         Index LA diam:    2.30 cm 1.26 cm/m  AORTIC VALVE LVOT Vmax:   97.50 cm/s LVOT Vmean:  64.600 cm/s LVOT VTI:    0.145 m MITRAL VALVE MV Area (PHT): 7.16 cm     SHUNTS MV Decel Time: 106 msec     Systemic VTI:  0.14 m MV E velocity: 73.30 cm/s   Systemic Diam: 2.00 cm MV A velocity: 103.00 cm/s MV E/A ratio:  0.71 Candee Furbish MD Electronically signed by Candee Furbish MD Signature Date/Time: 11/25/2019/4:34:05 PM    Final     12-lead ECG sinus  (personally reviewed) All prior EKG's in EPIC reviewed with no documented atrial fibrillation  Telemetry sinus with scant PACs personally reviewed)  Assessment and Plan:  1. Cryptogenic stroke The patient presents with cryptogenic stroke.  Concerns exist as to whether aortic crossclamping could have contributed.  Also spoke w Dr Hinton Rao (hematology) who has no problems with using anticoagulation if indicated; indeed thinks it may become separately indicated( if I understood her correctly.)   WE reviewed the lack of prospective data on the treatment of SCAF identified post ESUS; this is further complicated by recent data showing similar incidences of afib in pat with small vessel strokes      I spoke at length with the patient about monitoring for afib with an implantable loop recorder.   Risks, benefits, and alteratives to implantable loop recorder were discussed with the patient today.   At this time, the patient is very clear in their decision to proceed with implantable loop recorder.   She will be here over the weekend and so will defer LOOP insertion until the time of discharge  Will see again Monday     Wound care was reviewed with the patient (keep incision clean and dry for 3 days).  Wound check scheduled and entered in AVS.  Please call with questions.  Virl Axe, MD 11/26/2019 3:56 PM

## 2019-11-26 NOTE — Progress Notes (Signed)
  EP aware of and has discussed this patient with Neurology.   I discussed indications for loop recorder with the patient.   She is potentially a CIR candidate, the soonest bed availability would be Monday, thus, patient should be monitored on tele over the weekend and have loop placed on Monday, or prior to discharge to CIR if after that.   If turned down for CIR and patient discharged over weekend, we will see in close follow up and plan outpatient loop.   Discussed all above with Dr. Caryl Comes.   Legrand Como [address] [place], PA-C  11/26/2019 2:50 PM

## 2019-11-26 NOTE — Progress Notes (Signed)
STROKE TEAM PROGRESS NOTE   INTERVAL HISTORY Daughter at bedside.  Patient more awake alert and interactive today.  Stated that her left arm pain has resolved.  Still has right lower extremity mild ataxia, however, as per daughter this has been much improved from yesterday.  Vitals:   11/25/19 1954 11/26/19 0036 11/26/19 0428 11/26/19 0734  BP: 130/73 136/81 (!) 152/78 (!) 157/90  Pulse: 93 86 82 83  Resp: 20 20 16 16   Temp: 98.2 F (36.8 C) (!) 97.5 F (36.4 C) 98 F (36.7 C) 98 F (36.7 C)  TempSrc: Oral Oral Oral Oral  SpO2: 96% 95% 96% 95%  Weight:      Height:        CBC:  Recent Labs  Lab 11/24/19 1058 11/25/19 0706 11/26/19 0418  WBC 22.3* 18.3* 18.7*  NEUTROABS 16.7* 14.5*  --   HGB 11.9 11.6* 11.5*  HCT 36.0 37.6 35.6*  MCV 98* 104.4* 100.0  PLT 169 157 145*    Basic Metabolic Panel:  Recent Labs  Lab 11/25/19 0706 11/26/19 0418  NA 141 142  K 4.5 4.2  CL 104 106  CO2 25 22  GLUCOSE 103* 85  BUN 12 12  CREATININE 1.15* 1.20*  CALCIUM 9.4 8.8*   Lipid Panel:     Component Value Date/Time   CHOL 91 11/25/2019 2139   CHOL 139 10/25/2019 0948   TRIG 104 11/25/2019 2139   HDL 33 (L) 11/25/2019 2139   HDL 43 10/25/2019 0948   CHOLHDL 2.8 11/25/2019 2139   VLDL 21 11/25/2019 2139   LDLCALC 37 11/25/2019 2139   LDLCALC 74 10/25/2019 0948   HgbA1c:  Lab Results  Component Value Date   HGBA1C 5.2 10/25/2019   Urine Drug Screen: No results found for: LABOPIA, COCAINSCRNUR, LABBENZ, AMPHETMU, THCU, LABBARB  Alcohol Level No results found for: ETH  IMAGING past 24 hours MR BRAIN WO CONTRAST  Result Date: 11/25/2019 CLINICAL DATA:  Focal neural deficit, greater than 6 hours, stroke suspected. Progressive weakness for 2 weeks. Headache and left arm weakness. EXAM: MRI HEAD WITHOUT CONTRAST TECHNIQUE: Multiplanar, multiecho pulse sequences of the brain and surrounding structures were obtained without intravenous contrast. COMPARISON:  11/25/2019 CTA   head and neck.  06/27/2012 MRI head. FINDINGS: Brain: Sequela of prior left frontoparietal craniotomy. Scattered tiny and small foci of restricted diffusion involving the bilateral centrum semiovale, periventricular white matter, right frontal subcortical white matter, corpus callosum genu, right caudate head, left caudate body/tail, left thalamus, right temporal and occipital cortices and bilateral cerebellar hemispheres. Extensive background scattered and confluent T2/FLAIR hyperintense foci involving the periventricular and deep white matter likely reflect chronic microvascular ischemic changes. Tiny left cerebellar focus of hemosiderin deposition. No midline shift or ventriculomegaly. FLAIR hyperintense subdural collection measuring up to 4 mm with layering T1 hyperintensity overlying the left frontal convexity (1:17). Vascular: Normal flow voids. 2 mm left supraclinoid ICA aneurysm seen on recent CTA is not demonstrated on the current exam. Skull and upper cervical spine: Normal marrow signal. Sinuses/Orbits: Sequela of bilateral lens replacement. Mild ethmoid sinus mucosal thickening. Other: None. IMPRESSION: Scattered acute bilateral cerebral and cerebellar infarcts involving the corpus callosum genu, bilateral basal ganglia and left thalamus, likely embolic. Small left frontal convexity subdural hematoma, likely subacute on chronic. Extensive chronic microvascular ischemic changes. These results were called by telephone at the time of interpretation on 11/25/2019 at 1:20 pm to provider [facility] , who verbally acknowledged these results. Electronically Signed   By: Georga Kaufmann  [place] M.D.   On: 11/25/2019 13:22   ECHOCARDIOGRAM LIMITED  Result Date: 11/25/2019    ECHOCARDIOGRAM LIMITED REPORT   Patient Name:   Yolanda Flowers Date of Exam: 11/25/2019 Medical Rec #:  [id number]      Height:       66.0 in Accession #:    [id number]     Weight:       162.0 lb Date of Birth:  Dec 03, 1940     BSA:           1.828 m Patient Age:    60 years       BP:           146/92 mmHg Patient Gender: F              HR:           77 bpm. Exam Location:  Inpatient Procedure: Limited Color Doppler, Cardiac Doppler and Limited Echo Indications:    Stroke 434.91 / I163.9  History:        Patient has prior history of Echocardiogram examinations and                 Patient has no prior history of Echocardiogram examinations,                 most recent 11/03/2019. CAD, Prior CABG, COPD,                 Signs/Symptoms:Dyspnea; Risk Factors:Hypertension, Sleep Apnea                 and Former Smoker. GERD.  Sonographer:    Darlina Sicilian RDCS Referring Phys: [id number] [place]  1. Left ventricular ejection fraction, by estimation, is 65 to 70%. The left ventricle has normal function. The left ventricle has no regional wall motion abnormalities. There is mild left ventricular hypertrophy. Left ventricular diastolic parameters are consistent with Grade I diastolic dysfunction (impaired relaxation).  2. Right ventricular systolic function is normal. The right ventricular size is normal.  3. The mitral valve is normal in structure. No evidence of mitral valve regurgitation. No evidence of mitral stenosis.  4. The aortic valve is tricuspid. Aortic valve regurgitation is not visualized. Mild aortic valve sclerosis is present, with no evidence of aortic valve stenosis.  5. The inferior vena cava is normal in size with greater than 50% respiratory variability, suggesting right atrial pressure of 3 mmHg. Comparison(s): No significant change from prior study. Prior images reviewed side by side. Changes from prior study are noted. Conclusion(s)/Recommendation(s): No intracardiac source of embolism detected on this transthoracic study. A transesophageal echocardiogram is recommended to exclude cardiac source of embolism if clinically indicated. FINDINGS  Left Ventricle: Left ventricular ejection fraction, by estimation, is 65 to 70%.  The left ventricle has normal function. The left ventricle has no regional wall motion abnormalities. The left ventricular internal cavity size was normal in size. There is  mild left ventricular hypertrophy. Right Ventricle: The right ventricular size is normal. No increase in right ventricular wall thickness. Right ventricular systolic function is normal. Left Atrium: Left atrial size was normal in size. Right Atrium: Right atrial size was normal in size. Pericardium: There is no evidence of pericardial effusion. Mitral Valve: The mitral valve is normal in structure. Normal mobility of the mitral valve leaflets. Mild to moderate mitral annular calcification. No evidence of mitral valve stenosis. Tricuspid Valve: The tricuspid valve is normal in structure. Tricuspid valve regurgitation is not demonstrated.  No evidence of tricuspid stenosis. Aortic Valve: The aortic valve is tricuspid. Aortic valve regurgitation is not visualized. Mild aortic valve sclerosis is present, with no evidence of aortic valve stenosis. Pulmonic Valve: The pulmonic valve was normal in structure. Pulmonic valve regurgitation is not visualized. No evidence of pulmonic stenosis. Aorta: The aortic root is normal in size and structure. Venous: The inferior vena cava is normal in size with greater than 50% respiratory variability, suggesting right atrial pressure of 3 mmHg. IAS/Shunts: No atrial level shunt detected by color flow Doppler.  LEFT VENTRICLE PLAX 2D LVIDd:         2.90 cm  Diastology LVIDs:         2.10 cm  LV e' lateral:   4.24 cm/s LV PW:         1.40 cm  LV E/e' lateral: 17.3 LV IVS:        1.50 cm  LV e' medial:    3.05 cm/s LVOT diam:     2.00 cm  LV E/e' medial:  24.0 LV SV:         46 LV SV Index:   25 LVOT Area:     3.14 cm  LEFT ATRIUM         Index LA diam:    2.30 cm 1.26 cm/m  AORTIC VALVE LVOT Vmax:   97.50 cm/s LVOT Vmean:  64.600 cm/s LVOT VTI:    0.145 m MITRAL VALVE MV Area (PHT): 7.16 cm     SHUNTS MV Decel Time:  106 msec     Systemic VTI:  0.14 m MV E velocity: 73.30 cm/s   Systemic Diam: 2.00 cm MV A velocity: 103.00 cm/s MV E/A ratio:  0.71 Candee Furbish MD Electronically signed by Candee Furbish MD Signature Date/Time: 11/25/2019/4:34:05 PM    Final     PHYSICAL EXAM  Temp:  [97.5 F (36.4 C)-98.6 F (37 C)] 98.6 F (37 C) (04/09 1627) Pulse Rate:  [82-97] 94 (04/09 1627) Resp:  [16-20] 16 (04/09 1627) BP: (130-157)/(73-90) 147/83 (04/09 1627) SpO2:  [95 %-96 %] 95 % (04/09 1627)  General - Well nourished, well developed, in no apparent distress.  Ophthalmologic - fundi not visualized due to noncooperation.  Cardiovascular - Regular rhythm and rate.  Mental Status -  Level of arousal and orientation to time, place, and person were intact. Language including expression, naming, repetition, comprehension was assessed and found intact.  Cranial Nerves II - XII - II - Visual field intact OU. III, IV, VI - Extraocular movements intact. V - Facial sensation intact bilaterally. VII - Right mild nasolabial fold flattening. VIII - Hearing & vestibular intact bilaterally. X - Palate elevates symmetrically. XI - Chin turning & shoulder shrug intact bilaterally. XII - Tongue protrusion intact.  Motor Strength - The patient's strength was normal in all extremities and pronator drift was absent.  Bulk was normal and fasciculations were absent   Motor Tone - Muscle tone was assessed at the neck and appendages and was normal.  Reflexes - The patient's reflexes were symmetrical in all extremities and she had no pathological reflexes.  Sensory - Light touch, temperature/pinprick were assessed and were symmetrical.    Coordination - The patient had normal movements in the hands and left foot with no ataxia or dysmetria.  However, right heel-to-shin mild ataxia. Tremor was absent.  Gait and Station - deferred.   ASSESSMENT/PLAN Yolanda Flowers is a 79 y.o. female with history of thrombocytosis,  hypertension,  subdural hematoma in 2008, CAD, COPD s/p CABG on 11/04/2019 who developed LE weakness, unable to stand with L arm weakness.   Stroke:   Bilateral scattered posterior and anterior infarcts, cardioembolic pattern, occult afib vs. Procedure related with aorta cross clamping    CTA head & neck no acute abnormality. Moderate small vessel disease. No LVO. Small supraclinoid L ICA at superior hypophyseal artery origin.   MRI  Scattered B cerebral and cerebellar, corpus callosum genu, B basal ganglia, L thalamus infarcts. Small chronic L frontal convexity SDH. extensive small vessel disease.  LE Doppler  No DVT  2D Echo EF 65-70%. No source of embolus  LDL 37  HgbA1c 5.2 10/25/2019    For loop recorder placement by EP on Monday 4/12 - they will arrange as an OP if she is discharged prior to then  Lovenox 40 mg sq daily for VTE prophylaxis  aspirin 325 mg daily prior to admission, now on aspirin 81 mg daily and clopidogrel 75 mg daily following plavix load. Continue DAPT x 3 weeks then plavix alone    Therapy recommendations:  CIR- consult pending   Disposition:  pending   CAD s/p CABG  CABG 11/04/2019 with aorta crossclamping  Since then patient had generalized weakness as per patient and the daughter  Currently on DAPT  Cardiology on board  Hypertension  Stable . Long-term BP goal normotensive  Hyperlipidemia  Home meds:  lipitor 40  Now on lipitor 80  LDL 37, goal < 70  Continue statin at discharge  Other Stroke Risk Factors  Advanced age  Patient advised to stop using due to stroke risk.  Family hx stroke (father)  Obstructive sleep apnea  Congestive heart failure  Other Active Problems  Hx SDH 2008  Hospital day # 1  Neurology will sign off. Please call with questions. Pt will follow up with stroke clinic NP at [facility] in about 4 weeks. Thanks for the consult.  Rosalin Hawking, MD PhD Stroke Neurology 11/26/2019 7:52 PM    To contact Stroke  Continuity provider, please refer to [email or web address] After hours, contact General Neurology

## 2019-11-26 NOTE — Progress Notes (Signed)
TCTS Subjective:  Yolanda Flowers is well-known to me status post coronary artery bypass graft surgery x4 on 11/04/2019.  She had an uneventful surgery and no postoperative arrhythmias and was discharged home on 11/10/2019.  She reports that about 2 days after going home she woke up in the morning and noted loss of vision in the left eye.  She also noticed some " weakness" in the vision in her right eye but did not notice any field cut.  This did not improve and she was seen by an optometrist who thought that she probably had loss of vision due to emboli.  In retrospect she said that prior to discharge she noticed some fluttering in her vision in both eyes laterally but no vision loss.  Her vision loss has been stable since it initially occurred.  Then she woke up overnight yesterday to go to the bathroom as she usually does and had marked weakness in her legs and could not walk.  She said that she washes for couple hours without change and then called her daughter who called EMS.  CT of the head and neck showed no acute intracranial abnormality but moderate to marked chronic microvascular ischemic changes.  There is no large vessel occlusion or hemodynamically significant stenosis.  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  MRI of the brain showed scattered acute bilateral cerebral and cerebellar infarcts that were felt to be embolic.  There is a small left frontal subdural hematoma likely subacute on chronic.  She said that since admission her weakness has been stable.  She can move her legs well in bed but cannot bear weight to walk.  She had a limited 2D echo yesterday which did not show any source of cardiac embolism.  She says that she has had a few episodes of tachypalpitations since going home.  She was seen in our office on 11/15/2019 for evaluation of her right leg incision and possible cellulitis and was started on oral Cipro which she apparently did not tolerate and was switched to  doxycycline.  Objective: Vital signs in last 24 hours: Temp:  [97.5 F (36.4 C)-98.2 F (36.8 C)] 98 F (36.7 C) (04/09 0734) Pulse Rate:  [72-93] 83 (04/09 0734) Cardiac Rhythm: Normal sinus rhythm (04/09 0700) Resp:  [16-25] 16 (04/09 0734) BP: (112-157)/(65-96) 157/90 (04/09 0734) SpO2:  [95 %-100 %] 95 % (04/09 0734)  Hemodynamic parameters for last 24 hours:    Intake/Output from previous day: 04/08 0701 - 04/09 0700 In: -  Out: 750 [Urine:750] Intake/Output this shift: No intake/output data recorded.  General appearance: alert and cooperative Neurologic: Right leg weakness 4/5 Heart: regular rate and rhythm, S1, S2 normal, no murmur, click, rub or gallop Lungs: clear to auscultation bilaterally Extremities: Ecchymosis right lower extremity.  There is no cellulitis.  The leg incision is healing well.  Minimal residual edema. Wound: The chest incision is healing well and the sternum is stable.  Lab Results: Recent Labs    11/25/19 0706 11/26/19 0418  WBC 18.3* 18.7*  HGB 11.6* 11.5*  HCT 37.6 35.6*  PLT 157 145*   BMET:  Recent Labs    11/25/19 0706 11/26/19 0418  NA 141 142  K 4.5 4.2  CL 104 106  CO2 25 22  GLUCOSE 103* 85  BUN 12 12  CREATININE 1.15* 1.20*  CALCIUM 9.4 8.8*    PT/INR: No results for input(s): LABPROT, INR in the last 72 hours. ABG    Component  Value Date/Time   PHART 7.373 11/04/2019 2216   HCO3 22.5 11/04/2019 2216   TCO2 24 11/04/2019 2216   ACIDBASEDEF 3.0 (H) 11/04/2019 2216   O2SAT 83.0 11/04/2019 2216   CBG (last 3)  No results for input(s): GLUCAP in the last 72 hours.  Assessment/Plan: She is doing well from a cardiorespiratory standpoint status post coronary bypass graft surgery.  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  The most likely sources would be cardio emboli or emboli from the ascending aorta or aortic arch.  She had no symptoms  immediately postoperatively although crossclamping her aorta and having an aortic cannula in for cardiopulmonary bypass certainly could loosen up some plaque that could cause later emboli.  I do not think there is any specific treatment to prevent further emboli if they are related to her aorta.  If she had documented atrial fibrillation she would obviously need anticoagulation.  Hopefully she will recover some function with continued therapy.  Her right lower extremity cellulitis is resolved.  I would recommend having her evaluated by inpatient rehab which will probably help with her recovery.  I discussed all this with the patient and her husband at the bedside.  LOS: 1 day    Gaye Pollack 11/26/2019

## 2019-11-26 NOTE — Progress Notes (Signed)
SLP Cancellation Note  Patient Details Name: JERMAYA DRUCKER MRN: [id number] DOB: 22-May-1941   Cancelled treatment:       Reason Eval/Treat Not Completed: Patient at procedure or test/unavailable(Pt with cardiology at this time. SLP will follow up.)  Nasiah Lehenbauer I. Hardin Negus, [place], [place] Office number [phone] Pager [place] 11/26/2019, 3:26 PM

## 2019-11-26 NOTE — Progress Notes (Signed)
Subjective:  Denies SSCP, palpitations or Dyspnea Still with visual changes   Objective:  Vitals:   11/25/19 1954 11/26/19 0036 11/26/19 0428 11/26/19 0734  BP: 130/73 136/81 (!) 152/78 (!) 157/90  Pulse: 93 86 82 83  Resp: 20 20 16 16   Temp: 98.2 F (36.8 C) (!) 97.5 F (36.4 C) 98 F (36.7 C) 98 F (36.7 C)  TempSrc: Oral Oral Oral Oral  SpO2: 96% 95% 96% 95%  Weight:      Height:        Intake/Output from previous day:  Intake/Output Summary (Last 24 hours) at 11/26/2019 0853 Last data filed at 11/25/2019 1419 Gross per 24 hour  Intake --  Output 750 ml  Net -750 ml    Physical Exam: Affect appropriate Chronically ill white female  HEENT: normal Neck supple with no adenopathy JVP normal no bruits no thyromegaly Lungs clear with no wheezing and good diaphragmatic motion Heart:  S1/S2 no murmur, no rub, gallop or click PMI normal sternum post CABG healing well  Abdomen: benighn, BS positve, no tenderness, no AAA no bruit.  No HSM or HJR Distal pulses intact with no bruits Right LE SVG harvest site healing infection with bruising  Left visual field deficit worse left eye  Skin warm and dry No muscular weakness   Lab Results: Basic Metabolic Panel: Recent Labs    11/25/19 0706 11/26/19 0418  NA 141 142  K 4.5 4.2  CL 104 106  CO2 25 22  GLUCOSE 103* 85  BUN 12 12  CREATININE 1.15* 1.20*  CALCIUM 9.4 8.8*   Liver Function Tests: Recent Labs    11/25/19 0706 11/26/19 0418  AST 27 21  ALT 15 15  ALKPHOS 76 76  BILITOT 0.8 1.0  PROT 7.5 7.2  ALBUMIN 3.8 3.6   No results for input(s): LIPASE, AMYLASE in the last 72 hours. CBC: Recent Labs    11/24/19 1058 11/25/19 0706 11/26/19 0418  WBC 22.3* 18.3* 18.7*  NEUTROABS 16.7* 14.5*  --   HGB 11.9 11.6* 11.5*  HCT 36.0 37.6 35.6*  MCV 98* 104.4* 100.0  PLT 169 157 145*   Fasting Lipid Panel: Recent Labs    11/25/19 2139  CHOL 91  HDL 33*  LDLCALC 37  TRIG 104  CHOLHDL 2.8    Thyroid Function Tests: Recent Labs    11/24/19 1058  TSH 3.390   Anemia Panel: Recent Labs    11/25/19 1627  VITAMINB12 1,058*    Imaging: CT Angio Head W or Wo Contrast  Result Date: 11/25/2019 CLINICAL DATA:  Sudden onset headache, left-sided vision loss and weakness EXAM: CT ANGIOGRAPHY HEAD AND NECK TECHNIQUE: Multidetector CT imaging of the head and neck was performed using the standard protocol during bolus administration of intravenous contrast. Multiplanar CT image reconstructions and MIPs were obtained to evaluate the vascular anatomy. Carotid stenosis measurements (when applicable) are obtained utilizing NASCET criteria, using the distal internal carotid diameter as the denominator. CONTRAST:  98mL OMNIPAQUE IOHEXOL 350 MG/ML SOLN COMPARISON:  None. FINDINGS: CT HEAD Brain: There is no acute intracranial hemorrhage, mass effect, or edema. Gray-white differentiation is preserved. Confluent areas of hypoattenuation in the supratentorial white matter are nonspecific but may reflect moderate to marked chronic microvascular ischemic changes. Ventricles and sulci are within normal limits in size and configuration. Vascular: No hyperdense vessel. Intracranial atherosclerotic calcification at the skull base. Skull: Prior left frontoparietal craniotomy. Sinuses/Orbits: No acute finding. Other: None. Review of the MIP images confirms  the above findings CTA NECK Aortic arch: Mild calcified plaque along the aortic arch and great vessel origins, which are patent. Right carotid system: Patent. Mild calcified plaque along the proximal ICA without measurable stenosis. Left carotid system: Patent. Mild primarily calcified plaque along the proximal ICA without measurable stenosis. Vertebral arteries: Patent and codominant. Skeleton: Degenerative changes of the cervical spine. Degenerative changes of the temporomandibular joints. Other neck: No mass or adenopathy. Upper chest: No apical lung mass. Review  of the MIP images confirms the above findings CTA HEAD Anterior circulation: Intracranial internal carotid arteries are patent with mild calcified plaque. There is a 2 x 1.6 mm inferomedially directed outpouching from the supraclinoid left ICA. Anterior and middle cerebral arteries are patent. Posterior circulation: Intracranial vertebral arteries, basilar artery, and posterior cerebral arteries are patent. Venous sinuses: As permitted by contrast timing, patent. Review of the MIP images confirms the above findings IMPRESSION: No acute intracranial abnormality. Moderate to marked chronic microvascular ischemic changes. No large vessel occlusion, hemodynamically significant stenosis, or evidence of dissection. 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 Electronically Signed   By: Macy Mis M.D.   On: 11/25/2019 08:54   CT Angio Neck W and/or Wo Contrast  Result Date: 11/25/2019 CLINICAL DATA:  Sudden onset headache, left-sided vision loss and weakness EXAM: CT ANGIOGRAPHY HEAD AND NECK TECHNIQUE: Multidetector CT imaging of the head and neck was performed using the standard protocol during bolus administration of intravenous contrast. Multiplanar CT image reconstructions and MIPs were obtained to evaluate the vascular anatomy. Carotid stenosis measurements (when applicable) are obtained utilizing NASCET criteria, using the distal internal carotid diameter as the denominator. CONTRAST:  40mL OMNIPAQUE IOHEXOL 350 MG/ML SOLN COMPARISON:  None. FINDINGS: CT HEAD Brain: There is no acute intracranial hemorrhage, mass effect, or edema. Gray-white differentiation is preserved. Confluent areas of hypoattenuation in the supratentorial white matter are nonspecific but may reflect moderate to marked chronic microvascular ischemic changes. Ventricles and sulci are within normal limits in size and configuration. Vascular: No hyperdense vessel. Intracranial  atherosclerotic calcification at the skull base. Skull: Prior left frontoparietal craniotomy. Sinuses/Orbits: No acute finding. Other: None. Review of the MIP images confirms the above findings CTA NECK Aortic arch: Mild calcified plaque along the aortic arch and great vessel origins, which are patent. Right carotid system: Patent. Mild calcified plaque along the proximal ICA without measurable stenosis. Left carotid system: Patent. Mild primarily calcified plaque along the proximal ICA without measurable stenosis. Vertebral arteries: Patent and codominant. Skeleton: Degenerative changes of the cervical spine. Degenerative changes of the temporomandibular joints. Other neck: No mass or adenopathy. Upper chest: No apical lung mass. Review of the MIP images confirms the above findings CTA HEAD Anterior circulation: Intracranial internal carotid arteries are patent with mild calcified plaque. There is a 2 x 1.6 mm inferomedially directed outpouching from the supraclinoid left ICA. Anterior and middle cerebral arteries are patent. Posterior circulation: Intracranial vertebral arteries, basilar artery, and posterior cerebral arteries are patent. Venous sinuses: As permitted by contrast timing, patent. Review of the MIP images confirms the above findings IMPRESSION: No acute intracranial abnormality. Moderate to marked chronic microvascular ischemic changes. No large vessel occlusion, hemodynamically significant stenosis, or evidence of dissection. 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 Electronically Signed   By: Macy Mis M.D.   On: 11/25/2019 08:54   MR BRAIN WO CONTRAST  Result Date: 11/25/2019 CLINICAL DATA:  Focal neural deficit, greater than 6 hours, stroke suspected. Progressive weakness for 2 weeks. Headache and left arm weakness. EXAM: MRI HEAD WITHOUT CONTRAST TECHNIQUE: Multiplanar, multiecho pulse sequences of the brain and surrounding  structures were obtained without intravenous contrast. COMPARISON:  11/25/2019 CTA  head and neck.  06/27/2012 MRI head. FINDINGS: Brain: Sequela of prior left frontoparietal craniotomy. Scattered tiny and small foci of restricted diffusion involving the bilateral centrum semiovale, periventricular white matter, right frontal subcortical white matter, corpus callosum genu, right caudate head, left caudate body/tail, left thalamus, right temporal and occipital cortices and bilateral cerebellar hemispheres. Extensive background scattered and confluent T2/FLAIR hyperintense foci involving the periventricular and deep white matter likely reflect chronic microvascular ischemic changes. Tiny left cerebellar focus of hemosiderin deposition. No midline shift or ventriculomegaly. FLAIR hyperintense subdural collection measuring up to 4 mm with layering T1 hyperintensity overlying the left frontal convexity (1:17). Vascular: Normal flow voids. 2 mm left supraclinoid ICA aneurysm seen on recent CTA is not demonstrated on the current exam. Skull and upper cervical spine: Normal marrow signal. Sinuses/Orbits: Sequela of bilateral lens replacement. Mild ethmoid sinus mucosal thickening. Other: None. IMPRESSION: Scattered acute bilateral cerebral and cerebellar infarcts involving the corpus callosum genu, bilateral basal ganglia and left thalamus, likely embolic. Small left frontal convexity subdural hematoma, likely subacute on chronic. Extensive chronic microvascular ischemic changes. These results were called by telephone at the time of interpretation on 11/25/2019 at 1:20 pm to provider [facility] , who verbally acknowledged these results. Electronically Signed   By: Primitivo Gauze M.D.   On: 11/25/2019 13:22   DG Chest Portable 1 View  Result Date: 11/25/2019 CLINICAL DATA:  Weakness EXAM: PORTABLE CHEST 1 VIEW COMPARISON:  11/07/2019 FINDINGS: Cardiomegaly. CABG. There is no edema, consolidation, effusion, or  pneumothorax. Implantable loop recorder. Artifact from EKG leads. IMPRESSION: No evidence of acute disease. Electronically Signed   By: Monte Fantasia M.D.   On: 11/25/2019 07:43   ECHOCARDIOGRAM LIMITED  Result Date: 11/25/2019    ECHOCARDIOGRAM LIMITED REPORT   Patient Name:   KARIYAH CREST Date of Exam: 11/25/2019 Medical Rec #:  [id number]      Height:       66.0 in Accession #:    [id number]     Weight:       162.0 lb Date of Birth:  [birth year]     BSA:          1.828 m Patient Age:    21 years       BP:           146/92 mmHg Patient Gender: F              HR:           77 bpm. Exam Location:  Inpatient Procedure: Limited Color Doppler, Cardiac Doppler and Limited Echo Indications:    Stroke 434.91 / I163.9  History:        Patient has prior history of Echocardiogram examinations and                 Patient has no prior history of Echocardiogram examinations,                 most recent 11/03/2019. CAD, Prior CABG, COPD,                 Signs/Symptoms:Dyspnea; Risk Factors:Hypertension, Sleep Apnea                 and Former Smoker.  GERD.  Sonographer:    Darlina Sicilian RDCS Referring Phys: [id number] [place]  1. Left ventricular ejection fraction, by estimation, is 65 to 70%. The left ventricle has normal function. The left ventricle has no regional wall motion abnormalities. There is mild left ventricular hypertrophy. Left ventricular diastolic parameters are consistent with Grade I diastolic dysfunction (impaired relaxation).  2. Right ventricular systolic function is normal. The right ventricular size is normal.  3. The mitral valve is normal in structure. No evidence of mitral valve regurgitation. No evidence of mitral stenosis.  4. The aortic valve is tricuspid. Aortic valve regurgitation is not visualized. Mild aortic valve sclerosis is present, with no evidence of aortic valve stenosis.  5. The inferior vena cava is normal in size with greater than 50% respiratory variability,  suggesting right atrial pressure of 3 mmHg. Comparison(s): No significant change from prior study. Prior images reviewed side by side. Changes from prior study are noted. Conclusion(s)/Recommendation(s): No intracardiac source of embolism detected on this transthoracic study. A transesophageal echocardiogram is recommended to exclude cardiac source of embolism if clinically indicated. FINDINGS  Left Ventricle: Left ventricular ejection fraction, by estimation, is 65 to 70%. The left ventricle has normal function. The left ventricle has no regional wall motion abnormalities. The left ventricular internal cavity size was normal in size. There is  mild left ventricular hypertrophy. Right Ventricle: The right ventricular size is normal. No increase in right ventricular wall thickness. Right ventricular systolic function is normal. Left Atrium: Left atrial size was normal in size. Right Atrium: Right atrial size was normal in size. Pericardium: There is no evidence of pericardial effusion. Mitral Valve: The mitral valve is normal in structure. Normal mobility of the mitral valve leaflets. Mild to moderate mitral annular calcification. No evidence of mitral valve stenosis. Tricuspid Valve: The tricuspid valve is normal in structure. Tricuspid valve regurgitation is not demonstrated. No evidence of tricuspid stenosis. Aortic Valve: The aortic valve is tricuspid. Aortic valve regurgitation is not visualized. Mild aortic valve sclerosis is present, with no evidence of aortic valve stenosis. Pulmonic Valve: The pulmonic valve was normal in structure. Pulmonic valve regurgitation is not visualized. No evidence of pulmonic stenosis. Aorta: The aortic root is normal in size and structure. Venous: The inferior vena cava is normal in size with greater than 50% respiratory variability, suggesting right atrial pressure of 3 mmHg. IAS/Shunts: No atrial level shunt detected by color flow Doppler.  LEFT VENTRICLE PLAX 2D LVIDd:          2.90 cm  Diastology LVIDs:         2.10 cm  LV e' lateral:   4.24 cm/s LV PW:         1.40 cm  LV E/e' lateral: 17.3 LV IVS:        1.50 cm  LV e' medial:    3.05 cm/s LVOT diam:     2.00 cm  LV E/e' medial:  24.0 LV SV:         46 LV SV Index:   25 LVOT Area:     3.14 cm  LEFT ATRIUM         Index LA diam:    2.30 cm 1.26 cm/m  AORTIC VALVE LVOT Vmax:   97.50 cm/s LVOT Vmean:  64.600 cm/s LVOT VTI:    0.145 m MITRAL VALVE MV Area (PHT): 7.16 cm     SHUNTS MV Decel Time: 106 msec     Systemic VTI:  0.14  m MV E velocity: 73.30 cm/s   Systemic Diam: 2.00 cm MV A velocity: 103.00 cm/s MV E/A ratio:  0.71 Candee Furbish MD Electronically signed by Candee Furbish MD Signature Date/Time: 11/25/2019/4:34:05 PM    Final     Cardiac Studies:  ECG:    Telemetry:  NSR 11/26/2019   Echo: 11/25/19 EF 65-70% no significant valve disease   Medications:   . aspirin EC  81 mg Oral Daily  . atorvastatin  80 mg Oral q1800  . clopidogrel  75 mg Oral Daily  . doxycycline  100 mg Oral BID  . ferrous Q000111Q C-folic acid  1 capsule Oral BID PC  . hydroxyurea  500 mg Oral Daily  . pantoprazole  40 mg Oral Daily      Assessment/Plan:  1. Elevated Troponin:  No chest pain, no ECG changes post CABG on 11/03/19 by Dr Cyndia Bent no evidence of graft failure  Continue asa/statin and beta blocker  2. CVA:  CT negative MRI positive may have been issue with atherosclerotic plaque at time of aortic cross clamping as patient seems to indicate immediate issue post op and at that time there was definitely no PAF. Her Zio patch was d/c in ER EP to see today regarding ILR  Preop dopplers no carotid disease and CTA neck no lesions this admit  3. Cellulitis:   RLE SVG harvest On doxycycline seems to be healing   4. Thrombocytosis:  PLT 157 on hydroxeurea as outpatient WBC chronically elevated f/u hematology   5. HLD:  Continue statin    Jenkins Rouge 11/26/2019, 8:53 AM

## 2019-11-26 NOTE — TOC Initial Note (Addendum)
Transition of Care [facility]) - Initial/Assessment Note    Patient Details  Name: Yolanda Flowers MRN: [id number] Date of Birth: July 22, 1941  [facility] of [facility] CM/SW Contact:    Emeterio Reeve, [place] Phone Number: 11/26/2019, 1:30 PM  Clinical Narrative:                  CSW met with pt at bedside. CSW introduced self and explained her role at the hospital. Pt son was present. Pt stated that PTA she was independent and completing all ADL's. Pt stated she was able to still mow her grass and take care of her plants. Pt stated she is not ready to give up her independence. Pt stated her daughter and law would be with her during the day if she needs help. Pt son stated that if pt can walk and get up and down by herself they would want her to come back home with extra supports. If pt is unable to walk her son would like her to go to rehab before coming home.    CSW completed sbirt. Pt denied alcohol use. Pt did not need resources.   CSW is waiting to for the pt/ot reccs. CSW will continue to follow.    Expected Discharge Plan: Skilled Nursing Facility Barriers to Discharge: Continued Medical Work up   Patient Goals and CMS Choice Patient states their goals for this hospitalization and ongoing recovery are:: To go back home so i can work on my garden [employer].gov Compare Post Acute Care list provided to:: Patient Choice offered to / list presented to : Patient, Adult Children  Expected Discharge Plan and Services Expected Discharge Plan: [place] arrangements for the past 2 months: [place]                                      Prior Living Arrangements/Services Living arrangements for the past 2 months: [place] Lives with:: Self Patient language and need for interpreter reviewed:: No Do you feel safe going back to the place where you live?: Yes      Need for Family Participation in Patient Care: Yes  (Comment) Care giver support system in place?: Yes (comment)   Criminal Activity/Legal Involvement Pertinent to Current Situation/Hospitalization: No - Comment as needed  Activities of Daily Living      Permission Sought/Granted Permission sought to share information with : Family Supports Permission granted to share information with : Yes, Verbal Permission Granted  Share Information with NAME: [name]  Permission granted to share info w AGENCY: snf  Permission granted to share info w Relationship: son and daughter in law  Permission granted to share info w Contact Information: [phone]  Emotional Assessment Appearance:: Appears stated age Attitude/Demeanor/Rapport: Engaged Affect (typically observed): Appropriate, Pleasant Orientation: : Oriented to Self, Oriented to Place, Oriented to  Time, Oriented to Situation Alcohol / Substance Use: Never Used Psych Involvement: No (comment)  Admission diagnosis:  Stroke due to embolism ([place]) [I63.9] Cerebrovascular accident (CVA), unspecified mechanism ([place]) [I63.9] Patient Active Problem List   Diagnosis Date Noted  . Stroke due to embolism ([place]) 11/25/2019  . S/P CABG x 4 11/04/2019  . CAD (coronary artery disease) 10/25/2019  . Accelerating angina ([place]) 08/26/2019  . Vitamin D deficiency 08/24/2019  . Combined hyperlipidemia 08/24/2019  . Body mass index 29.0-29.9, adult 08/24/2019  .  Vitamin B 12 deficiency 08/24/2019  . Malignant melanoma of great toe ([place]) 08/24/2019  . Pedal edema 08/24/2019  . Skin cancer 08/24/2019  . Squamous cell carcinoma, scalp/neck 08/24/2019  . Ischial bursitis, right 08/24/2019  . Swelling of limb 08/24/2019  . Renal insufficiency 08/24/2019  . Atherosclerosis of arteries 08/24/2019  . Dyspnea 02/09/2019  . Solitary pulmonary nodule on lung CT 02/09/2019  . Gastrointestinal bleeding 06/23/2018  . VIN III (vulvar intraepithelial neoplasia III) 10/09/2015  . Melanoma in situ ([place]) 06/29/2014   . Essential thrombocytosis ([place]) 06/22/2014  . COPD (chronic obstructive pulmonary disease) ([place]) 06/21/2014  . HTN (hypertension) 06/21/2014  . Allergic rhinitis 06/21/2014  . GERD (gastroesophageal reflux disease) 06/21/2014   PCP:  [name], MD Pharmacy:   [id number] [place], [id number]: [phone]: [phone]    Social Determinants of Health (SDOH) Interventions    Readmission Risk Interventions No flowsheet data found.  MEmeterio Reeve LLatanya Presser LBlacksburgSocial Worker [phone]

## 2019-11-26 NOTE — Progress Notes (Signed)
Spoke with neurology team, Biby NP, regarding DVT prophylaxis given evidence of subacute/chronic subdural hematoma on MRI.  Cleared to continue with prophylactic anticoagulation given chronicity.  Will start on Lovenox 40 every 24 hours.  Patriciaann Clan, DO

## 2019-11-27 DIAGNOSIS — I6340 Cerebral infarction due to embolism of unspecified cerebral artery: Secondary | ICD-10-CM

## 2019-11-27 LAB — BASIC METABOLIC PANEL
Anion gap: 12 (ref 5–15)
BUN: 9 mg/dL (ref 8–23)
CO2: 22 mmol/L (ref 22–32)
Calcium: 8.8 mg/dL — ABNORMAL LOW (ref 8.9–10.3)
Chloride: 106 mmol/L (ref 98–111)
Creatinine, Ser: 1.03 mg/dL — ABNORMAL HIGH (ref 0.44–1.00)
GFR calc Af Amer: 60 mL/min — ABNORMAL LOW (ref >60)
GFR calc non Af Amer: 52 mL/min — ABNORMAL LOW (ref >60)
Glucose, Bld: 81 mg/dL (ref 70–99)
Potassium: 4 mmol/L (ref 3.5–5.1)
Sodium: 140 mmol/L (ref 135–145)

## 2019-11-27 LAB — CBC
HCT: 35.7 % — ABNORMAL LOW (ref 36.0–46.0)
Hemoglobin: 11.3 g/dL — ABNORMAL LOW (ref 12.0–15.0)
MCH: 32.2 pg (ref 26.0–34.0)
MCHC: 31.7 g/dL (ref 30.0–36.0)
MCV: 101.7 fL — ABNORMAL HIGH (ref 80.0–100.0)
Platelets: 151 10*3/uL (ref 150–400)
RBC: 3.51 MIL/uL — ABNORMAL LOW (ref 3.87–5.11)
RDW: 21.6 % — ABNORMAL HIGH (ref 11.5–15.5)
WBC: 16.9 10*3/uL — ABNORMAL HIGH (ref 4.0–10.5)
nRBC: 0.3 % — ABNORMAL HIGH (ref 0.0–0.2)

## 2019-11-27 LAB — HEMOGLOBIN A1C
Hgb A1c MFr Bld: 5.3 % (ref 4.8–5.6)
Mean Plasma Glucose: 105 mg/dL

## 2019-11-27 NOTE — Progress Notes (Addendum)
Brief progress note. I will cosign the resident's note once it is completed.  The patient stated that her leg weakness persists. She is also worried about not being able to control her bowel, she had two heavy bowel movements yesterday, and she felt as if she was unable to control her bowel. She is unclear if her stool was watery or soft. She denies stomach pain today. She became teary while talking with me. She denies depression; she said she is just not happy with her current situation.  Exam: Gen: No distress Neuro: She is awake and alert, oriented x3. No facial droop. Sensation is intact. Deep reflexes equal (Biceps) Heart: S1 S2 normal, no murmur. RRR. Lungs: CTA B/L. Abd: Soft, NT/ND, BS+ Ext: Trace edema.  A/P Embolic stroke No acute change. S/P neuro workup. Appreciate neuro's recs. PT/ eval - CIR Continue DAPT. High dose statin. Neuro f/u. Cards/CTS planning LOOP recorder insertion to access for Afib. Monitor for improvement with PT.  Abdominal pain and bowel control issue Abdominal pain resolved after a bowel movement. ?? Constipation vs diarrhea. Monitor bowel movements closely. If she continues to have watery stools, obtain a stool panel.  Right LL cellulitis: Complete course of A/B  Sad mood: No depression. Her current health condition if causing her stress. Monitor closely and reassess in the AM. Her other chronic problems are stable.

## 2019-11-27 NOTE — Evaluation (Signed)
Occupational Therapy Evaluation Patient Details Name: Yolanda Flowers MRN: [id number] DOB: November 04, 1940 Today's Date: 11/27/2019    History of Present Illness Patient is a 79 year old female who rpesented to the hospital with increased syncope and pushing to the right. 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 She had a CABG 3 weeks prior. She had returned home and was working on progressing her mobility. PMH: hx of subdural hematoma; left eye blindness following the CABG, anxiety, CAD, bilateral LE edema    Clinical Impression   Pt PTA: Pt living alone and reports independence with ADL and mobilty. Pt has sternal precautions from sx x3 weeks ago. Pt able to state precautions. Pt currently with deficits in ability to care for self, decreased strength, decreased activity tolerance and decreased balance. Pt modA +2 for mobility overall for safety and sequencing. R sided weakness noted, but not severe. Pt currently using RW for side stepping/stand pivot to recliner. Pt modA overall for ADL tasks. Pt requires increased encouragement. Dizziness noted in sitting, but does not worsen in standing. Pt unsafe yo go home alone and requires increased physical assist to perform ADL and mobility requiring CIR to return to MOD I. Pt would benefit from continued OT skilled services for ADL, mobility, and safety. OT following acutely.       Follow Up Recommendations  CIR    Equipment Recommendations  3 in 1 bedside commode    Recommendations for Other Services       Precautions / Restrictions Precautions Precautions: Sternal Precaution Comments: per patient still follwing sternal percautions  Restrictions Weight Bearing Restrictions: No      Mobility Bed Mobility Overal bed mobility: Needs Assistance Bed Mobility: Supine to Sit;Sit to Supine     Supine to sit: Min guard Sit to supine: Min guard   General bed mobility comments: Increased time and use of  rail.  Transfers Overall transfer level: Needs assistance   Transfers: Sit to/from Stand;Stand Pivot Transfers Sit to Stand: Min assist Stand pivot transfers: Mod assist;+2 physical assistance;+2 safety/equipment       General transfer comment: MinA for STS; modA +2 for direction of RW and to sequence through task    Balance Overall balance assessment: Needs assistance Sitting-balance support: Bilateral upper extremity supported Sitting balance-Leahy Scale: Fair Sitting balance - Comments: SBA while sitting    Standing balance support: Bilateral upper extremity supported Standing balance-Leahy Scale: Poor Standing balance comment: uses walker for balance                            ADL either performed or assessed with clinical judgement   ADL Overall ADL's : Needs assistance/impaired Eating/Feeding: Set up;Sitting   Grooming: Set up;Sitting   Upper Body Bathing: Set up;Sitting   Lower Body Bathing: Moderate assistance;+2 for physical assistance;+2 for safety/equipment;Sitting/lateral leans;Sit to/from stand   Upper Body Dressing : Minimal assistance;Sitting   Lower Body Dressing: Moderate assistance;+2 for physical assistance;+2 for safety/equipment;Sitting/lateral leans;Sit to/from stand   Toilet Transfer: Moderate assistance;+2 for physical assistance;+2 for safety/equipment;Stand-pivot;RW;BSC   Toileting- Clothing Manipulation and Hygiene: Moderate assistance;+2 for physical assistance;+2 for safety/equipment;Cueing for safety;Cueing for sequencing;Sitting/lateral lean;Sit to/from stand       Functional mobility during ADLs: Moderate assistance;+2 for physical assistance;+2 for safety/equipment;Cueing for safety;Rolling walker General ADL Comments: Pt with deficits in ability to care for self, decreased strength, decreased activity tolerance and decreased balance.     Vision  Baseline Vision/History: Wears glasses Wears Glasses: At all times Patient  Visual Report: No change from baseline;Other (comment)(L eye with visual deficits prior to this episode) Vision Assessment?: Yes Eye Alignment: Within Functional Limits Ocular Range of Motion: Within Functional Limits Alignment/Gaze Preference: Within Defined Limits Tracking/Visual Pursuits: Able to track stimulus in all quads without difficulty Saccades: Impaired - to be further tested in functional context(L eye light nystagmus or jumping, apparently happening prior) Additional Comments: L eye light nystagmus or jumping, apparently happening prior     Perception Perception Perception Tested?: Yes Spatial deficits: Pt with possible spatial deficits or depth perception deficits.   Praxis      Pertinent Vitals/Pain Pain Assessment: No/denies pain     Hand Dominance Right   Extremity/Trunk Assessment Upper Extremity Assessment Upper Extremity Assessment: Generalized weakness;RUE deficits/detail;LUE deficits/detail RUE Deficits / Details: strength 3-/5 MM grade, 3/5 hand squeeze LUE Deficits / Details: strength 3+/5 MM grade, 4/5 hand squeeze   Lower Extremity Assessment Lower Extremity Assessment: Generalized weakness RLE Deficits / Details: R hip flexion and abdcution weaker compared to left    Cervical / Trunk Assessment Cervical / Trunk Assessment: Kyphotic   Communication Communication Communication: No difficulties   Cognition Arousal/Alertness: Awake/alert Behavior During Therapy: WFL for tasks assessed/performed Overall Cognitive Status: Within Functional Limits for tasks assessed                                 General Comments: AOX3    General Comments  VSS on RA. 98% BPM. Pt's family member in room.    Exercises     Shoulder Instructions      Home Living Family/patient expects to be discharged to:: Inpatient rehab Living Arrangements: Alone Available Help at Discharge: Family Type of Home: House                       Home  Equipment: Kasandra Knudsen - single point   Additional Comments: Patients daughter in law helps at home   Lives With: Spouse    Prior Functioning/Environment Level of Independence: Independent with assistive device(s)        Comments: used a cane. Ptior to CABG she was independent and mowing the lawn on her own         OT Problem List: Decreased strength;Decreased activity tolerance;Impaired balance (sitting and/or standing);Impaired vision/perception;Decreased coordination;Decreased cognition;Impaired UE functional use;Pain;Decreased safety awareness      OT Treatment/Interventions: Self-care/ADL training;Therapeutic exercise;Neuromuscular education;Energy conservation;Therapeutic activities;Cognitive remediation/compensation;Visual/perceptual remediation/compensation;Balance training    OT Goals(Current goals can be found in the care plan section) Acute Rehab OT Goals Patient Stated Goal: to get back to independent living  OT Goal Formulation: With patient Time For Goal Achievement: 12/11/19 Potential to Achieve Goals: Good ADL Goals Pt Will Perform Grooming: with min guard assist;standing Pt Will Perform Lower Body Dressing: with min guard assist;sitting/lateral leans;sit to/from stand Pt Will Transfer to Toilet: with min guard assist;ambulating;bedside commode Pt Will Perform Toileting - Clothing Manipulation and hygiene: with min guard assist;sitting/lateral leans;sit to/from stand Pt/caregiver will Perform Home Exercise Program: Increased strength;Both right and left upper extremity;With Supervision Additional ADL Goal #1: Pt will stand x5 mins for ADL and mobility tasks with minguardA to increase activity tolerance.  OT Frequency: Min 2X/week   Barriers to D/C:            Co-evaluation PT/OT/SLP Co-Evaluation/Treatment: Yes Reason for Co-Treatment: Complexity of the patient's impairments (multi-system involvement) PT  goals addressed during session: Mobility/safety with  mobility;Proper use of DME;Strengthening/ROM;Balance OT goals addressed during session: ADL's and self-care      AM-PAC OT "6 Clicks" Daily Activity     Outcome Measure Help from another person eating meals?: None Help from another person taking care of personal grooming?: A Little Help from another person toileting, which includes using toliet, bedpan, or urinal?: A Lot Help from another person bathing (including washing, rinsing, drying)?: A Lot Help from another person to put on and taking off regular upper body clothing?: A Little Help from another person to put on and taking off regular lower body clothing?: A Lot 6 Click Score: 16   End of Session Equipment Utilized During Treatment: Gait belt;Rolling walker Nurse Communication: Mobility status  Activity Tolerance: Patient tolerated treatment well Patient left: in chair;with call bell/phone within reach;with chair alarm set  OT Visit Diagnosis: Unsteadiness on feet (R26.81);Muscle weakness (generalized) (M62.81)                Time: [id number] OT Time Calculation (min): 25 min Charges:  OT General Charges $OT Visit: 1 Visit OT Evaluation $OT Eval Moderate Complexity: 1 Mod  Jefferey Pica, OTR/L Acute Rehabilitation Services Pager: [phone] Office: [phone]   Zaim Nitta C 11/27/2019, 1:29 PM

## 2019-11-27 NOTE — Discharge Summary (Signed)
[facility] Discharge Summary  Patient name: Yolanda Flowers Medical record number: [id number] Date of birth: 30-Aug-1940 Age: 79 y.o. Gender: female Date of Admission: 11/25/2019  Date of Discharge: 11/30/2019 Admitting Physician: Kinnie Feil, MD  Primary Care Provider: [name], MD Consultants: Cardiology, EP, Neuro, CIR  Indication for Hospitalization: Weakness  Discharge Diagnoses/Problem List: Acute CVA CKD IIIb CAD s/p CABG HLD HTN Essential Thrombocytopenia Macrocytic Anemia Chronic Leukocytosis  Disposition: CIR  Discharge Condition: Stable  Discharge Exam: 04/213/2021  General: Alert and oriented, no apparent distress      Cardiovascular: RRR with no murmurs noted Respiratory: CTA bilaterally     Gastrointestinal: Bowel sounds present. No abdominal pain Extremities: Moving all extremities, no lower extremity edema  Brief Hospital Course:  Yolanda Flowers a 79 y.o. female presented with left arm weakness, headache, and vision changes. PMH is significant for HTN, COPD, GERD, h/o subdural hematoma in 2008, HLD, CAD s/p CABG 11/04/2019, thrombocytosis, cellulitis, melanoma in situ + SCC, vulvar intraepithelial neoplasia III in 2017. Hospital course is outlined below. Please see H&P for additional admission details.  Weakness 2/2 Acute CVA Patient presented to ED due to lower extremity weakness that start this morning of 11/25/19. Also endorsed headache, vision changes that started 2 weeks ago, and sharp pains in the left scapula. Headache and shoulder pain resolved s/p tylenol 650mg  x1 in the ED. At time of admission CN II-XII were grossly intact and exam was remarkable for right lower extremity weakness. 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 MRI brain showed scattered acute bilateral cerebral and cerebellar infarcts involving the corpus callosum genu,  bilateral basal ganglia and left thalamus, likely embolic. Small left frontal convexity subdural hematoma, likely subacute on chronic. Neurology was consulted and patient was started on DAPT (300mg  plavix +325mg  once followed by 75mg  daily +ASA 81mg  daily) and Lipitor 80mg  daily. Home metoprolol was held to allow permissive HTN up to 220/114mmHg. She was evaluated for inpatient rehabilitation due to continue lower extremeity weakness. She was discharge to CIR in stable condition.   Elevated troponin, Hx of CAD s/p CABG 11/03/2019 Troponin at time of admission was 24 and trended up to 275. She did not endorse chest pain and EKG was unchanged from last. Patient with hx of CABG 3 weeks ago by CVTS Dr. Cyndia Bent and has been followed closely oupatient. Cardiology was consulted and recommended loop recorder placed on the day of discharge.Troponin trended flat to 254.   Issues for Follow Up:  1. Pt will follow up with stroke clinic NP at [facility] in about 4 weeks. 2. Continue DAPT for 3 weeks. 3. Follow up with Heme-onc Outpatient  4. Loop recorder p;laced 04/13 please ensure follow up with Cardiology/EP  Significant Procedures: Loop recording inserted 11/30/2019  Significant Labs and Imaging:  Recent Labs  Lab 11/27/19 0443 11/28/19 0426 11/29/19 0412  WBC 16.9* 16.9* 18.7*  HGB 11.3* 11.8* 12.1  HCT 35.7* 37.1 38.5  PLT 151 154 177   Recent Labs  Lab 11/24/19 1058 11/24/19 1058 11/25/19 0706 11/25/19 0706 11/26/19 0418 11/26/19 0418 11/27/19 0443 11/27/19 0443 11/28/19 0426 11/29/19 0412  NA 142  --  141  --  142  --  140  --  140 141  K 5.1   < > 4.5   < > 4.2   < > 4.0   < > 4.1 4.3  CL 103   < >  104  --  106  --  106  --  108 107  CO2 22   < > 25  --  22  --  22  --  22 23  GLUCOSE 114*  --  103*  --  85  --  81  --  99 97  BUN 17  --  12  --  12  --  9  --  13 13  CREATININE 1.24*   < > 1.15*  --  1.20*  --  1.03*  --  1.10* 1.10*  CALCIUM 9.5   < > 9.4  --  8.8*  --  8.8*  --  8.7*  9.2  ALKPHOS 109  --  76  --  76  --   --   --   --   --   AST 27  --  27  --  21  --   --   --   --   --   ALT 14  --  15  --  15  --   --   --   --   --   ALBUMIN 4.3  --  3.8  --  3.6  --   --   --   --   --    < > = values in this interval not displayed.      Results/Tests Pending at Time of Discharge:  EP PPM/ICD IMPLANT  Result Date: 11/30/2019 SURGEON:  Thompson Grayer, MD   PREPROCEDURE DIAGNOSIS:  Cryptogenic Stroke   POSTPROCEDURE DIAGNOSIS:  Cryptogenic Stroke    PROCEDURES:  1. Implantable loop recorder implantation   INTRODUCTION:  Yolanda Flowers is a 79 y.o. female with a history of unexplained stroke who presents today for implantable loop implantation.  The patient has had a cryptogenic stroke.  Despite an extensive workup by neurology, no reversible causes have been identified.  she has worn telemetry during which she did not have arrhythmias.  There is significant concern for possible atrial fibrillation as the cause for the patients stroke.  The patient therefore presents today for implantable loop implantation.   DESCRIPTION OF PROCEDURE:  Informed written consent was obtained.  The patient required no sedation for the procedure today.  The patients left chest was prepped and draped. Mapping over the patient's chest was performed to identify the appropriate ILR site.  This area was found to be the left parasternal region over the 3rd-4th intercostal space.  The skin overlying this region was infiltrated with lidocaine for local analgesia.  A 0.5-cm incision was made at the implant site.  A subcutaneous ILR pocket was fashioned using a combination of sharp and blunt dissection.  A Medtronic Reveal Linq model [id number] implantable loop recorder was then placed into the pocket R waves were very prominent and measured > 0.2 mV. EBL<1 ml.  Steri- Strips and a sterile dressing were then applied.  There were no early apparent complications.   CONCLUSIONS:  1. Successful implantation of a Medtronic  Reveal LINQ implantable loop recorder for cryptogenic stroke  2. No early apparent complications. Thompson Grayer MD, [facility] 11/30/2019 2:59 PM   DG Swallowing Func-Speech Pathology  Result Date: 11/29/2019 Objective Swallowing Evaluation: Type of Study: MBS-Modified Barium Swallow Study  Patient Details Name: Yolanda Flowers MRN: [id number] Date of Birth: 23-Jul-1941 Today's Date: 11/29/2019 Time: SLP Start Time (ACUTE ONLY): 0910 -SLP Stop Time (ACUTE ONLY): 0930 SLP Time Calculation (min) (ACUTE ONLY): 20 min Past Medical History: Past  Medical History: Diagnosis Date . Anxiety  . Arthritis  . Asthma  . Bilateral edema of lower extremity  . Complication of anesthesia   difficulty waking . Congestive heart failure (CHF) ([place])  . COPD (chronic obstructive pulmonary disease) ([place])  . Coronary artery disease  . Dyspnea on exertion  . GERD (gastroesophageal reflux disease)  . History of melanoma excision   left greast toe 2015 . History of squamous cell carcinoma excision   face--  multiple excisions . History of subdural hematoma   2008 . Hypertension  . OSA (obstructive sleep apnea)   intolerant . Thrombocytosis ([place])   takes hydoxyurea--  MONITORED BY DR Hinton Rao [facility]) . VIN III (vulvar intraepithelial neoplasia III)  . Wears dentures   UPPER AND LOWER PARTIAL . Wears glasses  Past Surgical History: Past Surgical History: Procedure Laterality Date . ABDOMINAL HYSTERECTOMY  age 39 . CARDIAC CATHETERIZATION  03/172021 . CORONARY ARTERY BYPASS GRAFT N/A 11/04/2019  Procedure: CORONARY ARTERY BYPASS GRAFTING (CABG) x 4, with ENDOSCOPIC HARVESTING OF RIGHT GREATER SAPHENOUS VEIN.;  Surgeon: Gaye Pollack, MD;  Location: [facility] OR;  Service: Open Heart Surgery;  Laterality: N/A; . INTRAVASCULAR ULTRASOUND/IVUS N/A 11/03/2019  Procedure: Intravascular Ultrasound/IVUS;  Surgeon: Nelva Bush, MD;  Location: [place] CV LAB;  Service: Cardiovascular;  Laterality: N/A; . KNEE ARTHROSCOPY Left 2004 . LEFT HEART  CATH AND CORONARY ANGIOGRAPHY N/A 11/03/2019  Procedure: LEFT HEART CATH AND CORONARY ANGIOGRAPHY;  Surgeon: Nelva Bush, MD;  Location: [place] CV LAB;  Service: Cardiovascular;  Laterality: N/A; . MELANOMA EXCISION  2015  left great toe . REPAIR PERONEAL TENDONS ANKLE  2004 . SUBDURAL HEMATOMA EVACUATION VIA CRANIOTOMY  2008     week later  post-op  [facility] . TEE WITHOUT CARDIOVERSION N/A 11/04/2019  Procedure: TRANSESOPHAGEAL ECHOCARDIOGRAM (TEE);  Surgeon: Gaye Pollack, MD;  Location: [place];  Service: Open Heart Surgery;  Laterality: N/A; . VULVECTOMY N/A 10/24/2015  Procedure: WIDE LOCAL EXCISION OF THE VULVA ;  Surgeon: Everitt Amber, MD;  Location: [place];  Service: Gynecology;  Laterality: N/A; HPI: 79 Y/O F with PMX of VIN III, HTN, Thrombocytosis, HTN, OSA, hx of subdural hematoma, GERD, COPD, CAD, CHF presented with hx of LL weakness while she tried to go to the bathroom this morning. She denies any fall or LOC. She endorsed associated headache, no slurring of speech, or drooping of her face. She endorsed right LL swelling and pain following CABG harvesting. She is on A/B for cellulitis of her right LL. She denies chest pain, no SOB or cough. Recent hx of left vision changes for which her ophthalmologist recently evaluated her.  No data recorded Assessment / Plan / Recommendation CHL IP CLINICAL IMPRESSIONS 11/29/2019 Clinical Impression Pt demonstrates adequate laryngeal closure with slightly incomplete vestibular closure leading to very trace silent frank penetration events during the swallow. In most cases, penetrate is ejected with subsequent swallow, and despite lack of cough or throat clear response no barium passed into the subglottis. A cue for a light throat clear consistently ejected penetrate and a deep chin tuck with a straw prevented pentration (head turn ineffective). However, pt likely to remain safe from significant aspiration even if no interventions are  implemented. Deficts may even be more baseline and WNL for age rather than acute. Esophageal sweep unrevealing. Recommend pt continue a regular diet and thin liquids with f/u education for a throat clear strategy or a chin tuck strategy if coughing continues to  occur during meals.  SLP Visit Diagnosis Dysphagia, unspecified (R13.10) Attention and concentration deficit following -- Frontal lobe and executive function deficit following -- Impact on safety and function --   CHL IP TREATMENT RECOMMENDATION 11/29/2019 Treatment Recommendations Therapy as outlined in treatment plan below   Prognosis 11/29/2019 Prognosis for Safe Diet Advancement Good Barriers to Reach Goals -- Barriers/Prognosis Comment -- CHL IP DIET RECOMMENDATION 11/29/2019 SLP Diet Recommendations Regular solids;Thin liquid Liquid Administration via Cup;Straw Medication Administration Whole meds with liquid Compensations Slow rate;Small sips/bites;Chin tuck;Clear throat intermittently Postural Changes --   CHL IP OTHER RECOMMENDATIONS 11/29/2019 Recommended Consults -- Oral Care Recommendations Oral care BID Other Recommendations --   CHL IP FOLLOW UP RECOMMENDATIONS 11/29/2019 Follow up Recommendations Inpatient Rehab   CHL IP FREQUENCY AND DURATION 11/29/2019 Speech Therapy Frequency (ACUTE ONLY) min 2x/week Treatment Duration 1 week      CHL IP ORAL PHASE 11/29/2019 Oral Phase WFL Oral - Pudding Teaspoon -- Oral - Pudding Cup -- Oral - Honey Teaspoon -- Oral - Honey Cup -- Oral - Nectar Teaspoon -- Oral - Nectar Cup -- Oral - Nectar Straw -- Oral - Thin Teaspoon -- Oral - Thin Cup -- Oral - Thin Straw -- Oral - Puree -- Oral - Mech Soft -- Oral - Regular -- Oral - Multi-Consistency -- Oral - Pill -- Oral Phase - Comment --  CHL IP PHARYNGEAL PHASE 11/29/2019 Pharyngeal Phase Impaired Pharyngeal- Pudding Teaspoon -- Pharyngeal -- Pharyngeal- Pudding Cup -- Pharyngeal -- Pharyngeal- Honey Teaspoon -- Pharyngeal -- Pharyngeal- Honey Cup -- Pharyngeal --  Pharyngeal- Nectar Teaspoon -- Pharyngeal -- Pharyngeal- Nectar Cup -- Pharyngeal -- Pharyngeal- Nectar Straw -- Pharyngeal -- Pharyngeal- Thin Teaspoon -- Pharyngeal -- Pharyngeal- Thin Cup Penetration/Aspiration during swallow;Reduced epiglottic inversion;Compensatory strategies attempted (with notebox) Pharyngeal Material enters airway, CONTACTS cords and not ejected out;Material enters airway, remains ABOVE vocal cords and not ejected out;Material does not enter airway;Material enters airway, CONTACTS cords and then ejected out Pharyngeal- Thin Straw Penetration/Aspiration during swallow;Compensatory strategies attempted (with notebox) Pharyngeal Material enters airway, CONTACTS cords and then ejected out;Material enters airway, CONTACTS cords and not ejected out;Material enters airway, remains ABOVE vocal cords and not ejected out;Material does not enter airway;Material enters airway, remains ABOVE vocal cords then ejected out Pharyngeal- Puree -- Pharyngeal -- Pharyngeal- Mechanical Soft -- Pharyngeal -- Pharyngeal- Regular -- Pharyngeal -- Pharyngeal- Multi-consistency -- Pharyngeal -- Pharyngeal- Pill -- Pharyngeal -- Pharyngeal Comment --  CHL IP CERVICAL ESOPHAGEAL PHASE 11/29/2019 Cervical Esophageal Phase WFL Pudding Teaspoon -- Pudding Cup -- Honey Teaspoon -- Honey Cup -- Nectar Teaspoon -- Nectar Cup -- Nectar Straw -- Thin Teaspoon -- Thin Cup -- Thin Straw -- Puree -- Mechanical Soft -- Regular -- Multi-consistency -- Pill -- Cervical Esophageal Comment -- Herbie Baltimore, MA CCC-SLP Acute Rehabilitation Services Pager [phone] Office [phone] Lynann Beaver 11/29/2019, 10:26 AM              CT Angio Head W or Wo Contrast  Result Date: 11/25/2019 CLINICAL DATA:  Sudden onset headache, left-sided vision loss and weakness EXAM: CT ANGIOGRAPHY HEAD AND NECK TECHNIQUE: Multidetector CT imaging of the head and neck was performed using the standard protocol during bolus administration of  intravenous contrast. Multiplanar CT image reconstructions and MIPs were obtained to evaluate the vascular anatomy. Carotid stenosis measurements (when applicable) are obtained utilizing NASCET criteria, using the distal internal carotid diameter as the denominator. CONTRAST:  18mL OMNIPAQUE IOHEXOL 350 MG/ML SOLN COMPARISON:  None. FINDINGS: CT HEAD Brain:  There is no acute intracranial hemorrhage, mass effect, or edema. Gray-white differentiation is preserved. Confluent areas of hypoattenuation in the supratentorial white matter are nonspecific but may reflect moderate to marked chronic microvascular ischemic changes. Ventricles and sulci are within normal limits in size and configuration. Vascular: No hyperdense vessel. Intracranial atherosclerotic calcification at the skull base. Skull: Prior left frontoparietal craniotomy. Sinuses/Orbits: No acute finding. Other: None. Review of the MIP images confirms the above findings CTA NECK Aortic arch: Mild calcified plaque along the aortic arch and great vessel origins, which are patent. Right carotid system: Patent. Mild calcified plaque along the proximal ICA without measurable stenosis. Left carotid system: Patent. Mild primarily calcified plaque along the proximal ICA without measurable stenosis. Vertebral arteries: Patent and codominant. Skeleton: Degenerative changes of the cervical spine. Degenerative changes of the temporomandibular joints. Other neck: No mass or adenopathy. Upper chest: No apical lung mass. Review of the MIP images confirms the above findings CTA HEAD Anterior circulation: Intracranial internal carotid arteries are patent with mild calcified plaque. There is a 2 x 1.6 mm inferomedially directed outpouching from the supraclinoid left ICA. Anterior and middle cerebral arteries are patent. Posterior circulation: Intracranial vertebral arteries, basilar artery, and posterior cerebral arteries are patent. Venous sinuses: As permitted by contrast  timing, patent. Review of the MIP images confirms the above findings IMPRESSION: No acute intracranial abnormality. Moderate to marked chronic microvascular ischemic changes. No large vessel occlusion, hemodynamically significant stenosis, or evidence of dissection. 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 Electronically Signed   By: Macy Mis M.D.   On: 11/25/2019 08:54   CT Angio Neck W and/or Wo Contrast  Result Date: 11/25/2019 CLINICAL DATA:  Sudden onset headache, left-sided vision loss and weakness EXAM: CT ANGIOGRAPHY HEAD AND NECK TECHNIQUE: Multidetector CT imaging of the head and neck was performed using the standard protocol during bolus administration of intravenous contrast. Multiplanar CT image reconstructions and MIPs were obtained to evaluate the vascular anatomy. Carotid stenosis measurements (when applicable) are obtained utilizing NASCET criteria, using the distal internal carotid diameter as the denominator. CONTRAST:  25mL OMNIPAQUE IOHEXOL 350 MG/ML SOLN COMPARISON:  None. FINDINGS: CT HEAD Brain: There is no acute intracranial hemorrhage, mass effect, or edema. Gray-white differentiation is preserved. Confluent areas of hypoattenuation in the supratentorial white matter are nonspecific but may reflect moderate to marked chronic microvascular ischemic changes. Ventricles and sulci are within normal limits in size and configuration. Vascular: No hyperdense vessel. Intracranial atherosclerotic calcification at the skull base. Skull: Prior left frontoparietal craniotomy. Sinuses/Orbits: No acute finding. Other: None. Review of the MIP images confirms the above findings CTA NECK Aortic arch: Mild calcified plaque along the aortic arch and great vessel origins, which are patent. Right carotid system: Patent. Mild calcified plaque along the proximal ICA without measurable stenosis. Left carotid system: Patent. Mild primarily  calcified plaque along the proximal ICA without measurable stenosis. Vertebral arteries: Patent and codominant. Skeleton: Degenerative changes of the cervical spine. Degenerative changes of the temporomandibular joints. Other neck: No mass or adenopathy. Upper chest: No apical lung mass. Review of the MIP images confirms the above findings CTA HEAD Anterior circulation: Intracranial internal carotid arteries are patent with mild calcified plaque. There is a 2 x 1.6 mm inferomedially directed outpouching from the supraclinoid left ICA. Anterior and middle cerebral arteries are patent. Posterior circulation: Intracranial vertebral arteries, basilar artery, and posterior cerebral arteries are patent. Venous sinuses: As permitted by contrast timing,  patent. Review of the MIP images confirms the above findings IMPRESSION: No acute intracranial abnormality. Moderate to marked chronic microvascular ischemic changes. No large vessel occlusion, hemodynamically significant stenosis, or evidence of dissection. 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 Electronically Signed   By: Macy Mis M.D.   On: 11/25/2019 08:54   MR BRAIN WO CONTRAST  Result Date: 11/25/2019 CLINICAL DATA:  Focal neural deficit, greater than 6 hours, stroke suspected. Progressive weakness for 2 weeks. Headache and left arm weakness. EXAM: MRI HEAD WITHOUT CONTRAST TECHNIQUE: Multiplanar, multiecho pulse sequences of the brain and surrounding structures were obtained without intravenous contrast. COMPARISON:  11/25/2019 CTA  head and neck.  06/27/2012 MRI head. FINDINGS: Brain: Sequela of prior left frontoparietal craniotomy. Scattered tiny and small foci of restricted diffusion involving the bilateral centrum semiovale, periventricular white matter, right frontal subcortical white matter, corpus callosum genu, right caudate head, left caudate body/tail, left thalamus, right temporal and  occipital cortices and bilateral cerebellar hemispheres. Extensive background scattered and confluent T2/FLAIR hyperintense foci involving the periventricular and deep white matter likely reflect chronic microvascular ischemic changes. Tiny left cerebellar focus of hemosiderin deposition. No midline shift or ventriculomegaly. FLAIR hyperintense subdural collection measuring up to 4 mm with layering T1 hyperintensity overlying the left frontal convexity (1:17). Vascular: Normal flow voids. 2 mm left supraclinoid ICA aneurysm seen on recent CTA is not demonstrated on the current exam. Skull and upper cervical spine: Normal marrow signal. Sinuses/Orbits: Sequela of bilateral lens replacement. Mild ethmoid sinus mucosal thickening. Other: None. IMPRESSION: Scattered acute bilateral cerebral and cerebellar infarcts involving the corpus callosum genu, bilateral basal ganglia and left thalamus, likely embolic. Small left frontal convexity subdural hematoma, likely subacute on chronic. Extensive chronic microvascular ischemic changes. These results were called by telephone at the time of interpretation on 11/25/2019 at 1:20 pm to provider [facility] , who verbally acknowledged these results. Electronically Signed   By: Primitivo Gauze M.D.   On: 11/25/2019 13:22   DG Chest Portable 1 View  Result Date: 11/25/2019 CLINICAL DATA:  Weakness EXAM: PORTABLE CHEST 1 VIEW COMPARISON:  11/07/2019 FINDINGS: Cardiomegaly. CABG. There is no edema, consolidation, effusion, or pneumothorax. Implantable loop recorder. Artifact from EKG leads. IMPRESSION: No evidence of acute disease. Electronically Signed   By: Monte Fantasia M.D.   On: 11/25/2019 07:43   VAS [place] LOWER EXTREMITY VENOUS (DVT)  Result Date: 11/28/2019  Lower Venous DVTStudy Indications: Thrombocytosis.  Risk Factors: Surgery. Comparison Study: No prior studies. Performing Technologist: Oliver Hum RVT  Examination Guidelines: A complete evaluation  includes B-mode imaging, spectral Doppler, color Doppler, and power Doppler as needed of all accessible portions of each vessel. Bilateral testing is considered an integral part of a complete examination. Limited examinations for reoccurring indications may be performed as noted. The reflux portion of the exam is performed with the patient in reverse Trendelenburg.  +---------+---------------+---------+-----------+----------+--------------+ RIGHT    CompressibilityPhasicitySpontaneityPropertiesThrombus Aging +---------+---------------+---------+-----------+----------+--------------+ CFV      Full           Yes      Yes                                 +---------+---------------+---------+-----------+----------+--------------+ SFJ      Full                                                        +---------+---------------+---------+-----------+----------+--------------+  FV Prox  Full                                                        +---------+---------------+---------+-----------+----------+--------------+ FV Mid   Full                                                        +---------+---------------+---------+-----------+----------+--------------+ FV DistalFull                                                        +---------+---------------+---------+-----------+----------+--------------+ PFV      Full                                                        +---------+---------------+---------+-----------+----------+--------------+ POP      Full           Yes      Yes                                 +---------+---------------+---------+-----------+----------+--------------+ PTV      Full                                                        +---------+---------------+---------+-----------+----------+--------------+ PERO     Full                                                         +---------+---------------+---------+-----------+----------+--------------+   +---------+---------------+---------+-----------+----------+--------------+ LEFT     CompressibilityPhasicitySpontaneityPropertiesThrombus Aging +---------+---------------+---------+-----------+----------+--------------+ CFV      Full           Yes      Yes                                 +---------+---------------+---------+-----------+----------+--------------+ SFJ      Full                                                        +---------+---------------+---------+-----------+----------+--------------+ FV Prox  Full                                                        +---------+---------------+---------+-----------+----------+--------------+  FV Mid   Full                                                        +---------+---------------+---------+-----------+----------+--------------+ FV DistalFull                                                        +---------+---------------+---------+-----------+----------+--------------+ PFV      Full                                                        +---------+---------------+---------+-----------+----------+--------------+ POP      Full           Yes      Yes                                 +---------+---------------+---------+-----------+----------+--------------+ PTV      Full                                                        +---------+---------------+---------+-----------+----------+--------------+ PERO     Full                                                        +---------+---------------+---------+-----------+----------+--------------+     Summary: RIGHT: - There is no evidence of deep vein thrombosis in the lower extremity.  - No cystic structure found in the popliteal fossa.  LEFT: - There is no evidence of deep vein thrombosis in the lower extremity.  - No cystic structure found in the popliteal fossa.   *See table(s) above for measurements and observations. Electronically signed by Servando Snare MD on 11/28/2019 at 1:44:43 PM.    Final    ECHOCARDIOGRAM LIMITED  Result Date: 11/25/2019    ECHOCARDIOGRAM LIMITED REPORT   Patient Name:   Yolanda Flowers Date of Exam: 11/25/2019 Medical Rec #:  [id number]      Height:       66.0 in Accession #:    [id number]     Weight:       162.0 lb Date of Birth:  09/19/1940     BSA:          1.828 m Patient Age:    55 years       BP:           146/92 mmHg Patient Gender: F              HR:           77 bpm. Exam Location:  Inpatient Procedure: Limited Color Doppler, Cardiac Doppler and Limited Echo Indications:  Stroke 434.91 / I163.9  History:        Patient has prior history of Echocardiogram examinations and                 Patient has no prior history of Echocardiogram examinations,                 most recent 11/03/2019. CAD, Prior CABG, COPD,                 Signs/Symptoms:Dyspnea; Risk Factors:Hypertension, Sleep Apnea                 and Former Smoker. GERD.  Sonographer:    Darlina Sicilian RDCS Referring Phys: [id number] [place]  1. Left ventricular ejection fraction, by estimation, is 65 to 70%. The left ventricle has normal function. The left ventricle has no regional wall motion abnormalities. There is mild left ventricular hypertrophy. Left ventricular diastolic parameters are consistent with Grade I diastolic dysfunction (impaired relaxation).  2. Right ventricular systolic function is normal. The right ventricular size is normal.  3. The mitral valve is normal in structure. No evidence of mitral valve regurgitation. No evidence of mitral stenosis.  4. The aortic valve is tricuspid. Aortic valve regurgitation is not visualized. Mild aortic valve sclerosis is present, with no evidence of aortic valve stenosis.  5. The inferior vena cava is normal in size with greater than 50% respiratory variability, suggesting right atrial pressure of 3 mmHg.  Comparison(s): No significant change from prior study. Prior images reviewed side by side. Changes from prior study are noted. Conclusion(s)/Recommendation(s): No intracardiac source of embolism detected on this transthoracic study. A transesophageal echocardiogram is recommended to exclude cardiac source of embolism if clinically indicated. FINDINGS  Left Ventricle: Left ventricular ejection fraction, by estimation, is 65 to 70%. The left ventricle has normal function. The left ventricle has no regional wall motion abnormalities. The left ventricular internal cavity size was normal in size. There is  mild left ventricular hypertrophy. Right Ventricle: The right ventricular size is normal. No increase in right ventricular wall thickness. Right ventricular systolic function is normal. Left Atrium: Left atrial size was normal in size. Right Atrium: Right atrial size was normal in size. Pericardium: There is no evidence of pericardial effusion. Mitral Valve: The mitral valve is normal in structure. Normal mobility of the mitral valve leaflets. Mild to moderate mitral annular calcification. No evidence of mitral valve stenosis. Tricuspid Valve: The tricuspid valve is normal in structure. Tricuspid valve regurgitation is not demonstrated. No evidence of tricuspid stenosis. Aortic Valve: The aortic valve is tricuspid. Aortic valve regurgitation is not visualized. Mild aortic valve sclerosis is present, with no evidence of aortic valve stenosis. Pulmonic Valve: The pulmonic valve was normal in structure. Pulmonic valve regurgitation is not visualized. No evidence of pulmonic stenosis. Aorta: The aortic root is normal in size and structure. Venous: The inferior vena cava is normal in size with greater than 50% respiratory variability, suggesting right atrial pressure of 3 mmHg. IAS/Shunts: No atrial level shunt detected by color flow Doppler.  LEFT VENTRICLE PLAX 2D LVIDd:         2.90 cm  Diastology LVIDs:         2.10 cm   LV e' lateral:   4.24 cm/s LV PW:         1.40 cm  LV E/e' lateral: 17.3 LV IVS:        1.50 cm  LV e' medial:  3.05 cm/s LVOT diam:     2.00 cm  LV E/e' medial:  24.0 LV SV:         46 LV SV Index:   25 LVOT Area:     3.14 cm  LEFT ATRIUM         Index LA diam:    2.30 cm 1.26 cm/m  AORTIC VALVE LVOT Vmax:   97.50 cm/s LVOT Vmean:  64.600 cm/s LVOT VTI:    0.145 m MITRAL VALVE MV Area (PHT): 7.16 cm     SHUNTS MV Decel Time: 106 msec     Systemic VTI:  0.14 m MV E velocity: 73.30 cm/s   Systemic Diam: 2.00 cm MV A velocity: 103.00 cm/s MV E/A ratio:  0.71 Candee Furbish MD Electronically signed by Candee Furbish MD Signature Date/Time: 11/25/2019/4:34:05 PM    Final   Discharge Medications:  Allergies as of 11/30/2019      Reactions   Cephalexin Hives   Ciprofloxacin Other (See Comments)   Gi intolerance   Penicillins Rash   Childhood reaction Did it involve swelling of the face/tongue/throat, SOB, or low BP? No Did it involve sudden or severe rash/hives, skin peeling, or any reaction on the inside of your mouth or nose? No Did you need to seek medical attention at a hospital or doctor's office? No When did it last happen?50 years ago If all above answers are "NO", may proceed with cephalosporin use.      Medication List    STOP taking these medications   ciprofloxacin 250 MG tablet Commonly known as: Cipro   doxycycline 100 MG capsule Commonly known as: VIBRAMYCIN   fluconazole 150 MG tablet Commonly known as: DIFLUCAN   promethazine 12.5 MG tablet Commonly known as: PHENERGAN   traMADol 50 MG tablet Commonly known as: ULTRAM     TAKE these medications   ammonium lactate 12 % lotion Commonly known as: LAC-HYDRIN Apply 1 application topically daily as needed (skin spots).   aspirin 81 MG EC tablet Take 1 tablet (81 mg total) by mouth daily. Start taking on: December 01, 2019 What changed:   medication strength  how much to take   atorvastatin 80 MG tablet Commonly  known as: LIPITOR Take 1 tablet (80 mg total) by mouth daily at 6 PM. What changed:   medication strength  how much to take   CALCIUM 600 + D PO Take 1 tablet by mouth in the morning and at bedtime.   clopidogrel 75 MG tablet Commonly known as: PLAVIX Take 1 tablet (75 mg total) by mouth daily. Start taking on: December 01, 2019   erythromycin ophthalmic ointment Place 1 application into both eyes daily as needed (pain/ irritation).   ferrous [id number] C-folic acid capsule Commonly known as: TRINSICON / FOLTRIN Take 1 capsule by mouth 2 (two) times daily after a meal.   fluticasone 50 MCG/ACT nasal spray Commonly known as: FLONASE Place 2 sprays into both nostrils at bedtime.   hydroxyurea 500 MG capsule Commonly known as: HYDREA Take 500 mg by mouth daily.   lansoprazole 30 MG capsule Commonly known as: PREVACID Take 30 mg by mouth every morning.   Magnesium 250 MG Tabs Take 250 mg by mouth at bedtime.   metoprolol tartrate 25 MG tablet Commonly known as: LOPRESSOR Take 1 tablet (25 mg total) by mouth 2 (two) times daily.   mupirocin ointment 2 % Commonly known as: BACTROBAN Apply 1 application topically daily as needed (after cancer removal procedures).  polyethylene glycol 17 g packet Commonly known as: MIRALAX / GLYCOLAX Take 17 g by mouth daily as needed for mild constipation.   PreserVision AREDS 2 Caps Take 1 capsule by mouth in the morning and at bedtime.   vitamin C 1000 MG tablet Take 1,000 mg by mouth daily.   Vitamin D (Ergocalciferol) 1.25 MG (50000 UNIT) Caps capsule Commonly known as: DRISDOL Take 50,000 Units by mouth every 14 (fourteen) days.       Discharge Instructions: Please refer to Patient Instructions section of EMR for full details.  Patient was counseled important signs and symptoms that should prompt return to medical care, changes in medications, dietary instructions, activity restrictions, and follow up appointments.    Follow-Up Appointments: Follow-up Information    [facility]. Schedule an appointment as soon as possible for a visit in 4 week(s).   Specialty: Neurology Contact information: [place] [phone]       [place] [facility] Follow up.   Specialty: Cardiology Why: 12/07/2019 @ 9:30AM, wound check visit (heart montior) Contact information: [address], [address] [place]          Carollee Leitz, MD 11/30/2019, 3:12 PM PGY-1, [place]

## 2019-11-27 NOTE — Progress Notes (Signed)
Family Medicine Teaching Service Daily Progress Note Intern Pager: [phone]  Patient name: Yolanda Flowers Medical record number: [id number] Date of birth: [birth year] Age: 79 y.o. Gender: female  Primary Care Provider: [name], MD Consultants: Neuro, Cardio Code Status: DNR  Pt Overview and Major Events to Date:  4/8 Admitted, Neuro + Cardio consulted 4/9 LE doppler US  Assessment and Plan: Yolanda Flowers is a 79 y.o. female presenting with left arm weakness, headache, and vision changes. PMH is significant for HTN, COPD, GERD, h/o subdural hematoma in 2008, HLD, CAD s/p CABG 11/04/2019, thrombocytosis, cellulitis, melanoma in situ + SCC, vulvar intraepithelial neoplasia III in 2017.  Weakness 2/2 Acute CVA Continues to endorse leg weakness and drifting to the right when attempting to walk. -Continue DAPT 75mg  daily +ASA 81mg   -Continue Lipitor 80mg  daily -Neurology consulted; signed off 4/9 Pt will follow up with stroke clinic NP at [facility] in about 4 weeks -Cardiology consulted; Loop recorder insertion at discharge; await for recommendation of medical discharge -CIR consult: Candidate for inpatient therapy -If cardiology oks medical discharge; contact CIR for bed availability  LE wound cellulitis. Improved S/p 5 days of doxycyline. LE doppler negative for DVT. Non infectious scab healing.  Abdominal discomfort No longer endorses but had 2 large stools of near incontinence overnight. Prefers to take her medication with food. -Allow food/snacks with meds -Continue to monitor -Consider GI panel if worsens: c/f C. Diff.  Elevated Cr 1.03 at baseline -Continue to monitor on BMP  Elevated troponin, trended flat  CAD s/p CABG 11/03/2019 -DAPT as above -Cardiology consulted as above -Hold metoprolol  -repeat EKG and trop if develops chest pain  HLD Lipid panel 10/25/2019. Tot 139. HDL 43. LDL 74. -Statin as above  HTN BP 145/83. Will allow for permissive HTN up to 220/120.   -Hold metoprolol as above  Thrombocytosis. Chronic. Plt 151. Home medications are: Hydroxyurea 500mg  daily, ASA 325mg   -Continue hydroxyurea -Start DAPT -Monitor on CBC  Macrocytic Anemia. Chronic. Stable. Hgb 11.5. At baseline. MCV 104. 4. B12 lvl 1058. Not likely alcohol related due to lack of history. -Monitor on CBC -Continue b12 supp  Neutrophilia. Likely Chronic. WBC 16.9. ANC 14,500.   -Continue to monitor on CBC  FEN/GI: Miralax Prophylaxis: Lovenox  Disposition: Home pending further medical work up  Subjective:  Weak yet abdominal discomfort has improved.   Objective: Temp:  [97.9 F (36.6 C)-98.6 F (37 C)] 98 F (36.7 C) (04/10 0326) Pulse Rate:  [90-97] 91 (04/10 0326) Resp:  [16-18] 18 (04/10 0326) BP: (141-155)/(73-93) 145/83 (04/10 0326) SpO2:  [93 %-98 %] 98 % (04/10 0326)  Physical Exam:  General: Appears tired yet improved, no acute distress. Age appropriate. Husband at bedside. Cardiac: RRR, normal heart sounds, no murmurs Respiratory: CTAB, normal effort Abdomen: soft, nontender, nondistended, +BS Extremities: No edema or cyanosis. Rt ext with non infectious healing scab. Neuro: alert and oriented, no focal deficits  Laboratory: Recent Labs  Lab 11/25/19 0706 11/26/19 0418 11/27/19 0443  WBC 18.3* 18.7* 16.9*  HGB 11.6* 11.5* 11.3*  HCT 37.6 35.6* 35.7*  PLT 157 145* 151   Recent Labs  Lab 11/24/19 1058 11/24/19 1058 11/25/19 0706 11/26/19 0418 11/27/19 0443  NA 142  --  141 142 140  K 5.1   < > 4.5 4.2 4.0  CL 103   < > 104 106 106  CO2 22   < > 25 22 22   BUN 17  --  12 12 9   CREATININE  1.24*   < > 1.15* 1.20* 1.03*  CALCIUM 9.5   < > 9.4 8.8* 8.8*  PROT 7.5  --  7.5 7.2  --   BILITOT 0.5  --  0.8 1.0  --   ALKPHOS 109  --  76 76  --   ALT 14  --  15 15  --   AST 27  --  27 21  --   GLUCOSE 114*  --  103* 85 81   < > = values in this interval not displayed.    Imaging/Diagnostic Tests: Vascular US LE RIGHT:  -  There is no evidence of deep vein thrombosis in the lower extremity.   - No cystic structure found in the popliteal fossa.    LEFT:  - There is no evidence of deep vein thrombosis in the lower extremity.   - No cystic structure found in the popliteal fossa.   Gerlene Fee, DO 11/27/2019, 8:01 AM PGY-1, [place] Intern pager: [phone], text pages welcome

## 2019-11-27 NOTE — Consult Note (Signed)
Physical Medicine and Rehabilitation Consult   Reason for Consult: Functional deficits due to stroke.  Referring Physician: Dr. Gwendlyn Deutscher.    HPI: Yolanda Flowers is a 79 y.o. female with history of HTN, thrombocytosis, SDH, COPD, CAD s/p CABG 10/29/19 who was admitted on 11/25/19 with BLE weakness and LUE weakness. CTA head/neck was negative for acute abnormality or LVO and showed moderate to marked chronic microvascular changes. MRI brain done revealing scattered bilateral acute cerebral and cerebellar infarct and small subacute on chronic small left frontal SDH.  Stroke felt to be embolic and TEE/loop recorder recommended as well as DAPT X 3 weeks.  2D echo showed EF 65-70% with no wall abnormality and mild aortic sclerosis. BLE dopplers were negative for DVT.   Patient with reported visual changes after surgery which progressed to loss of vision in left eye and decrease vision in right eye 2 days past d/c to home after CABG and loss felt to be due to emboli in nature.  Cardiology questioned embolism due to clamping of aorta or cardio-embolic in nature.  Work up underway and plans for [employer]. CIR recommended due to left sided weakness and anticipation of rehab needs.    Review of Systems  Constitutional: Negative for chills and fever.  HENT: Negative for hearing loss and tinnitus.   Eyes: Positive for blurred vision (loss of vision in left and decresed vision in right ).       Had been unable to read  Respiratory: Positive for shortness of breath (unable to walk household distance without SOB/rest).   Cardiovascular: Positive for chest pain (better since surgery). Negative for leg swelling.  Gastrointestinal: Positive for heartburn. Negative for abdominal pain.  Genitourinary: Negative for dysuria and urgency.  Musculoskeletal: Positive for back pain.  Skin: Negative for rash.  Neurological: Positive for weakness. Negative for dizziness and sensory change.  Psychiatric/Behavioral: The  patient does not have insomnia.       Past Medical History:  Diagnosis Date  . Anxiety   . Arthritis   . Asthma   . Bilateral edema of lower extremity   . Complication of anesthesia    difficulty waking  . Congestive heart failure (CHF) ([place])   . COPD (chronic obstructive pulmonary disease) ([place])   . Coronary artery disease   . Dyspnea on exertion   . GERD (gastroesophageal reflux disease)   . History of melanoma excision    left greast toe 2015  . History of squamous cell carcinoma excision    face--  multiple excisions  . History of subdural hematoma    2008  . Hypertension   . OSA (obstructive sleep apnea)    intolerant  . Thrombocytosis ([place])    takes hydoxyurea--  MONITORED BY DR Hinton Rao [facility])  . VIN III (vulvar intraepithelial neoplasia III)   . Wears dentures    UPPER AND LOWER PARTIAL  . Wears glasses     Past Surgical History:  Procedure Laterality Date  . ABDOMINAL HYSTERECTOMY  age 51  . CARDIAC CATHETERIZATION  03/172021  . CORONARY ARTERY BYPASS GRAFT N/A 11/04/2019   Procedure: CORONARY ARTERY BYPASS GRAFTING (CABG) x 4, with ENDOSCOPIC HARVESTING OF RIGHT GREATER SAPHENOUS VEIN.;  Surgeon: Gaye Pollack, MD;  Location: [place] [facility];  Service: Open Heart Surgery;  Laterality: N/A;  . INTRAVASCULAR ULTRASOUND/IVUS N/A 11/03/2019   Procedure: Intravascular Ultrasound/IVUS;  Surgeon: Nelva Bush, MD;  Location: [place] [facility];  Service: Cardiovascular;  Laterality: N/A;  .  KNEE ARTHROSCOPY Left 2004  . LEFT HEART CATH AND CORONARY ANGIOGRAPHY N/A 11/03/2019   Procedure: LEFT HEART CATH AND CORONARY ANGIOGRAPHY;  Surgeon: Nelva Bush, MD;  Location: [place] [facility];  Service: Cardiovascular;  Laterality: N/A;  . MELANOMA EXCISION  2015   left great toe  . REPAIR PERONEAL TENDONS ANKLE  2004  . SUBDURAL HEMATOMA EVACUATION VIA CRANIOTOMY  2008      week later  post-op  [facility]  . TEE WITHOUT CARDIOVERSION N/A 11/04/2019    Procedure: TRANSESOPHAGEAL ECHOCARDIOGRAM (TEE);  Surgeon: Gaye Pollack, MD;  Location: [place];  Service: Open Heart Surgery;  Laterality: N/A;  . VULVECTOMY N/A 10/24/2015   Procedure: WIDE LOCAL EXCISION OF THE VULVA ;  Surgeon: Everitt Amber, MD;  Location: [place];  Service: Gynecology;  Laterality: N/A;    Family History  Problem Relation Age of Onset  . Clotting disorder Mother   . Hypertension Mother   . Heart disease Mother   . Arthritis Mother   . Stroke Father   . Hypertension Father   . Heart disease Father   . Arthritis Father   . Emphysema Father   . Asthma Father   . Kidney failure Father   . Hypertension Sister   . Heart disease Sister   . Arthritis Sister   . Hypertension Brother   . Heart disease Brother   . Arthritis Brother     Social History: Married.   Married. Independent but has been limited by angina and SOB prior for months. She reports that she has never smoked. She has never used smokeless tobacco. She reports that she does not drink alcohol or use drugs.    Allergies  Allergen Reactions  . Cephalexin Hives  . Ciprofloxacin Other (See Comments)    Gi intolerance  . Penicillins Rash    Childhood reaction Did it involve swelling of the face/tongue/throat, SOB, or low BP? No Did it involve sudden or severe rash/hives, skin peeling, or any reaction on the inside of your mouth or nose? No Did you need to seek medical attention at a hospital or doctor's office? No When did it last happen?50 years ago If all above answers are "NO", may proceed with cephalosporin use.    Medications Prior to Admission  Medication Sig Dispense Refill  . Ascorbic Acid (VITAMIN C) 1000 MG tablet Take 1,000 mg by mouth daily.    Marland Kitchen aspirin EC 325 MG EC tablet Take 1 tablet (325 mg total) by mouth daily.    Marland Kitchen atorvastatin (LIPITOR) 40 MG tablet Take 1 tablet (40 mg total) by mouth daily at 6 PM. 30 tablet 1  . Calcium Carb-Cholecalciferol (CALCIUM 600  + D PO) Take 1 tablet by mouth in the morning and at bedtime.     Marland Kitchen doxycycline (VIBRAMYCIN) 100 MG capsule Take 100 mg by mouth See admin instructions. Take 2 capsules on day 1, then 1 capsule daily.    Marland Kitchen erythromycin ophthalmic ointment Place 1 application into both eyes daily as needed (pain/ irritation).     . ferrous [id number] C-folic acid (TRINSICON / FOLTRIN) capsule Take 1 capsule by mouth 2 (two) times daily after a meal. 60 capsule 1  . fluticasone (FLONASE) 50 MCG/ACT nasal spray Place 2 sprays into both nostrils at bedtime.     . hydroxyurea (HYDREA) 500 MG capsule Take 500 mg by mouth daily.     . lansoprazole (PREVACID) 30 MG capsule Take 30 mg by mouth  every morning.     . Magnesium 250 MG TABS Take 250 mg by mouth at bedtime.     . metoprolol tartrate (LOPRESSOR) 25 MG tablet Take 1 tablet (25 mg total) by mouth 2 (two) times daily. 30 tablet 1  . Multiple Vitamins-Minerals (PRESERVISION AREDS 2) CAPS Take 1 capsule by mouth in the morning and at bedtime.    . mupirocin ointment (BACTROBAN) 2 % Apply 1 application topically daily as needed (after cancer removal procedures).     . promethazine (PHENERGAN) 12.5 MG tablet Take 12.5 mg by mouth 2 (two) times daily as needed for nausea or vomiting.    . traMADol (ULTRAM) 50 MG tablet Take 50 mg by mouth every 6 (six) hours as needed for moderate pain. Up to 7 days    . Vitamin D, Ergocalciferol, (DRISDOL) 1.25 MG (50000 UT) CAPS capsule Take 50,000 Units by mouth every 14 (fourteen) days.    Marland Kitchen ammonium lactate (LAC-HYDRIN) 12 % lotion Apply 1 application topically daily as needed (skin spots).     . [EXPIRED] ciprofloxacin (CIPRO) 250 MG tablet Take 1 tablet (250 mg total) by mouth 2 (two) times daily for 10 days. (Patient not taking: Reported on 11/25/2019) 20 tablet 0  . fluconazole (DIFLUCAN) 150 MG tablet Take 1 tablet by mouth once.      Home: Home Living Family/patient expects to be discharged to:: Inpatient  rehab Living Arrangements: Alone Available Help at Discharge: Family Type of Home: House Home Equipment: Kasandra Knudsen - single point Additional Comments: Patients daughter in law helps at home   Lives With: Spouse  Functional History: Prior Function Level of Independence: Independent with assistive device(s) Comments: used a cane. Ptior to CABG she was independent and mowing the lawn on her own  Functional Status:  Mobility: Bed Mobility Overal bed mobility: Needs Assistance Bed Mobility: Supine to Sit, Sit to Supine Supine to sit: Min guard Sit to supine: Min guard General bed mobility comments: Increased time and use of rail. Transfers Overall transfer level: Needs assistance Transfers: Sit to/from Stand, Stand Pivot Transfers Sit to Stand: Min assist Stand pivot transfers: Mod assist, +2 physical assistance, +2 safety/equipment General transfer comment: MinA for STS; modA +2 for direction of RW and to sequence through task Ambulation/Gait Ambulation/Gait assistance: Mod assist Gait Distance (Feet): 3 Feet Assistive device: Rolling walker (2 wheeled) Gait Pattern/deviations: Step-to pattern(decreased right coordination ) General Gait Details: Decreased coordiantion of the left leg. Patient moved without moving the walker. She required frequent cuing to stay int he walker and not to sit before she got to the chair.  Gait velocity: decreased    ADL: ADL Overall ADL's : Needs assistance/impaired Eating/Feeding: Set up, Sitting Grooming: Set up, Sitting Upper Body Bathing: Set up, Sitting Lower Body Bathing: Moderate assistance, +2 for physical assistance, +2 for safety/equipment, Sitting/lateral leans, Sit to/from stand Upper Body Dressing : Minimal assistance, Sitting Lower Body Dressing: Moderate assistance, +2 for physical assistance, +2 for safety/equipment, Sitting/lateral leans, Sit to/from stand Toilet Transfer: Moderate assistance, +2 for physical assistance, +2 for  safety/equipment, Stand-pivot, RW, BSC Toileting- Clothing Manipulation and Hygiene: Moderate assistance, +2 for physical assistance, +2 for safety/equipment, Cueing for safety, Cueing for sequencing, Sitting/lateral lean, Sit to/from stand Functional mobility during ADLs: Moderate assistance, +2 for physical assistance, +2 for safety/equipment, Cueing for safety, Rolling walker General ADL Comments: Pt with deficits in ability to care for self, decreased strength, decreased activity tolerance and decreased balance.  Cognition: Cognition Overall Cognitive Status: Within Functional  Limits for tasks assessed Arousal/Alertness: Awake/alert Orientation Level: Oriented X4 Memory: Appears intact Awareness: Appears intact Problem Solving: Appears intact Safety/Judgment: Appears intact Cognition Arousal/Alertness: Awake/alert Behavior During Therapy: WFL for tasks assessed/performed Overall Cognitive Status: Within Functional Limits for tasks assessed General Comments: AOX3   Blood pressure 131/83, pulse 94, temperature 98 F (36.7 C), temperature source Oral, resp. rate 18, height 5\' 6"  (1.676 m), weight 73.5 kg, SpO2 95 %. Physical Exam  Nursing note and vitals reviewed. General: Alert and oriented x 3, No apparent distress HEENT: Head is normocephalic, atraumatic, PERRLA, EOMI, sclera anicteric, oral mucosa pink and moist, dentition intact, ext ear canals clear,  Neck: Supple without JVD or lymphadenopathy Heart: Reg rate and rhythm. No murmurs rubs or gallops Chest: CTA bilaterally without wheezes, rales, or rhonchi; no distress Abdomen: Soft, non-tender, non-distended, bowel sounds positive. Extremities: No clubbing, cyanosis, or edema. Pulses are 2+ Skin: Clean and intact without signs of breakdown Neuro: Pt is cognitively appropriate with normal insight, memory, and awareness. Cranial nerves 2-12 are intact with the exception or right mild nasolabial fold flattening. Sensory exam is  normal. 4+/5 strength throughout. No focal deficits. + RLE dysmetria Psych: Pt's affect is appropriate. Pt is cooperative. Depressed mood, situational.  .  Results for orders placed or performed during the hospital encounter of 11/25/19 (from the past 24 hour(s))  Basic metabolic panel     Status: Abnormal   Collection Time: 11/27/19  4:43 AM  Result Value Ref Range   Sodium 140 135 - 145 mmol/L   Potassium 4.0 3.5 - 5.1 mmol/L   Chloride 106 98 - 111 mmol/L   CO2 22 22 - 32 mmol/L   Glucose, Bld 81 70 - 99 mg/dL   BUN 9 8 - 23 mg/dL   Creatinine, Ser 1.03 (H) 0.44 - 1.00 mg/dL   Calcium 8.8 (L) 8.9 - 10.3 mg/dL   GFR calc non Af Amer 52 (L) >60 mL/min   GFR calc Af Amer 60 (L) >60 mL/min   Anion gap 12 5 - 15  CBC     Status: Abnormal   Collection Time: 11/27/19  4:43 AM  Result Value Ref Range   WBC 16.9 (H) 4.0 - 10.5 K/uL   RBC 3.51 (L) 3.87 - 5.11 MIL/uL   Hemoglobin 11.3 (L) 12.0 - 15.0 g/dL   HCT 35.7 (L) 36.0 - 46.0 %   MCV 101.7 (H) 80.0 - 100.0 fL   MCH 32.2 26.0 - 34.0 pg   MCHC 31.7 30.0 - 36.0 g/dL   RDW 21.6 (H) 11.5 - 15.5 %   Platelets 151 150 - 400 K/uL   nRBC 0.3 (H) 0.0 - 0.2 %   VAS [place] LOWER EXTREMITY VENOUS (DVT)  Result Date: 11/26/2019  Lower Venous DVTStudy Indications: Thrombocytosis.  Risk Factors: Surgery. Comparison Study: No prior studies. Performing Technologist: Oliver Hum RVT  Examination Guidelines: A complete evaluation includes B-mode imaging, spectral Doppler, color Doppler, and power Doppler as needed of all accessible portions of each vessel. Bilateral testing is considered an integral part of a complete examination. Limited examinations for reoccurring indications may be performed as noted. The reflux portion of the exam is performed with the patient in reverse Trendelenburg.  +---------+---------------+---------+-----------+----------+--------------+ RIGHT    CompressibilityPhasicitySpontaneityPropertiesThrombus Aging  +---------+---------------+---------+-----------+----------+--------------+ CFV      Full           Yes      Yes                                 +---------+---------------+---------+-----------+----------+--------------+  SFJ      Full                                                        +---------+---------------+---------+-----------+----------+--------------+ FV Prox  Full                                                        +---------+---------------+---------+-----------+----------+--------------+ FV Mid   Full                                                        +---------+---------------+---------+-----------+----------+--------------+ FV DistalFull                                                        +---------+---------------+---------+-----------+----------+--------------+ PFV      Full                                                        +---------+---------------+---------+-----------+----------+--------------+ POP      Full           Yes      Yes                                 +---------+---------------+---------+-----------+----------+--------------+ PTV      Full                                                        +---------+---------------+---------+-----------+----------+--------------+ PERO     Full                                                        +---------+---------------+---------+-----------+----------+--------------+   +---------+---------------+---------+-----------+----------+--------------+ LEFT     CompressibilityPhasicitySpontaneityPropertiesThrombus Aging +---------+---------------+---------+-----------+----------+--------------+ CFV      Full           Yes      Yes                                 +---------+---------------+---------+-----------+----------+--------------+ SFJ      Full                                                         +---------+---------------+---------+-----------+----------+--------------+  FV Prox  Full                                                        +---------+---------------+---------+-----------+----------+--------------+ FV Mid   Full                                                        +---------+---------------+---------+-----------+----------+--------------+ FV DistalFull                                                        +---------+---------------+---------+-----------+----------+--------------+ PFV      Full                                                        +---------+---------------+---------+-----------+----------+--------------+ POP      Full           Yes      Yes                                 +---------+---------------+---------+-----------+----------+--------------+ PTV      Full                                                        +---------+---------------+---------+-----------+----------+--------------+ PERO     Full                                                        +---------+---------------+---------+-----------+----------+--------------+     Summary: RIGHT: - There is no evidence of deep vein thrombosis in the lower extremity.  - No cystic structure found in the popliteal fossa.  LEFT: - There is no evidence of deep vein thrombosis in the lower extremity.  - No cystic structure found in the popliteal fossa.  *See table(s) above for measurements and observations.    Preliminary      Assessment/Plan: Diagnosis: Embolic cerebral, cerebellar, corpus callosum basal ganglia, and thalamic infarcts 1. Does the need for close, 24 hr/day medical supervision in concert with the patient's rehab needs make it unreasonable for this patient to be served in a less intensive setting? Yes 2. Co-Morbidities requiring supervision/potential complications: HTN, thrombocytosis, SDH, COPD, CAD s/p CABG 10/29/19 3. Due to bladder management, bowel  management, safety, skin/wound care, disease management, medication administration, pain management and patient education, does the patient require 24 hr/day rehab nursing? Yes 4. Does the patient require coordinated care of a physician, rehab nurse, therapy disciplines of PT, OT to address  physical and functional deficits in the context of the above medical diagnosis(es)? Yes Addressing deficits in the following areas: balance, endurance, locomotion, strength, transferring, bowel/bladder control, bathing, dressing, feeding, grooming, toileting and psychosocial support 5. Can the patient actively participate in an intensive therapy program of at least 3 hrs of therapy per day at least 5 days per week? Yes 6. The potential for patient to make measurable gains while on inpatient rehab is excellent 7. Anticipated functional outcomes upon discharge from inpatient rehab are min assist  with PT, min assist with OT, modified independent with SLP. 8. Estimated rehab length of stay to reach the above functional goals is: 17-20 days 9. Anticipated discharge destination: Home 10. Overall Rehab/Functional Prognosis: excellent  RECOMMENDATIONS: This patient's condition is appropriate for continued rehabilitative care in the following setting: CIR Patient has agreed to participate in recommended program. Yes Note that insurance prior authorization may be required for reimbursement for recommended care.  Comment: Mrs. Reicks would be an excellent CIR candidate. Thank you for this consult. Rehab admission coordinator to follow on Monday.  Reesa Chew, PA-C   I have personally performed a face to face diagnostic evaluation, including, but not limited to relevant history and physical exam findings, of this patient and developed relevant assessment and plan.  Additionally, I have reviewed and concur with the physician assistant's documentation above.  Leeroy Cha, MD

## 2019-11-27 NOTE — Evaluation (Signed)
Physical Therapy Evaluation Patient Details Name: Yolanda Flowers MRN: [id number] DOB: [birth year] Today's Date: 11/27/2019   History of Present Illness  Patient is a 79 year old female who rpesented to the hospital with increased syncope and pushing to the right. 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 She had a Cabage 3 weeks prior. She had returned home and was working on progressing her mobility. PMH: hx of subdural hematoma; left eye blindness following the CABG, anxiety, CAD, bilateral LE edema   Clinical Impression  Patient presents with decreased safety and coordination with transfers and ambulation. She required mod a to stay in the walker and to not sit before getting to the chair. She required guarding for sitting. She has a high baseline mobility and is very motivated to return to independent living. She would benefit from inpatient rehab at this time. Skilled acute therapy will continue to work with her to progress her ability to ambulate and transfer.     Follow Up Recommendations CIR    Equipment Recommendations  Rolling walker with 5" wheels(uses a cane )    Recommendations for Other Services       Precautions / Restrictions Precautions Precautions: Sternal Precaution Comments: per patient still follwing sternal percautions  Restrictions Weight Bearing Restrictions: No      Mobility  Bed Mobility Overal bed mobility: Needs Assistance Bed Mobility: Supine to Sit;Sit to Supine     Supine to sit: Min guard Sit to supine: Min guard   General bed mobility comments: Slow transfer to the edge of the bed. Patient reported increased syncope   Transfers Overall transfer level: Needs assistance   Transfers: Sit to/from Stand Sit to Stand: Min assist         General transfer comment: Min a for intial strength and balance. No pushing noted compred to patient report from the last time she stood up. Still syncopal upon standing but  it was able the same as it was the last visit.   Ambulation/Gait Ambulation/Gait assistance: Mod assist Gait Distance (Feet): 3 Feet Assistive device: Rolling walker (2 wheeled) Gait Pattern/deviations: Step-to pattern(decreased right coordination ) Gait velocity: decreased   General Gait Details: Decreased coordiantion of the left leg. Patient moved without moving the walker. She required frequent cuing to stay int he walker and not to sit before she got to the chair.   Stairs            Wheelchair Mobility    Modified Rankin (Stroke Patients Only) Modified Rankin (Stroke Patients Only) Pre-Morbid Rankin Score: No symptoms Modified Rankin: Moderately severe disability     Balance Overall balance assessment: Needs assistance Sitting-balance support: Bilateral upper extremity supported Sitting balance-Leahy Scale: Fair Sitting balance - Comments: SBA while sitting      Standing balance-Leahy Scale: Poor Standing balance comment: uses walker for balance                              Pertinent Vitals/Pain Pain Assessment: No/denies pain    Home Living Family/patient expects to be discharged to:: Inpatient rehab Living Arrangements: Alone Available Help at Discharge: Family Type of Home: House         Home Equipment: Kasandra Knudsen - single point Additional Comments: Patients daughter in law helps at home     Prior Function Level of Independence: Independent with assistive device(s)         Comments: used a cane.  Ptior to CABG she was independent and mowing the lawn on her own      Hand Dominance   Dominant Hand: Right    Extremity/Trunk Assessment   Upper Extremity Assessment Upper Extremity Assessment: Defer to OT evaluation    Lower Extremity Assessment Lower Extremity Assessment: Generalized weakness;RLE deficits/detail RLE Deficits / Details: R hip flexion and abdcution weaker compared to left        Communication   Communication: No  difficulties  Cognition Arousal/Alertness: Awake/alert Behavior During Therapy: WFL for tasks assessed/performed Overall Cognitive Status: Within Functional Limits for tasks assessed                                 General Comments: AOX3       General Comments      Exercises     Assessment/Plan    PT Assessment Patient needs continued PT services  PT Problem List Decreased strength;Decreased range of motion;Decreased activity tolerance;Decreased knowledge of use of DME;Decreased mobility;Decreased coordination       PT Treatment Interventions Gait training;Stair training;DME instruction;Functional mobility training;Therapeutic activities;Therapeutic exercise;Neuromuscular re-education;Patient/family education    PT Goals (Current goals can be found in the Care Plan section)  Acute Rehab PT Goals Patient Stated Goal: to get back to indepdnent living  PT Goal Formulation: With patient/family Time For Goal Achievement: 12/11/19 Potential to Achieve Goals: Good    Frequency Min 3X/week   Barriers to discharge Decreased caregiver support has help during the day but needed helpw with all transfers today     Co-evaluation PT/OT/SLP Co-Evaluation/Treatment: Yes Reason for Co-Treatment: Complexity of the patient's impairments (multi-system involvement);Necessary to address cognition/behavior during functional activity;For patient/therapist safety;To address functional/ADL transfers PT goals addressed during session: Mobility/safety with mobility;Proper use of DME;Strengthening/ROM;Balance         AM-PAC PT "6 Clicks" Mobility  Outcome Measure Help needed turning from your back to your side while in a flat bed without using bedrails?: A Little Help needed moving from lying on your back to sitting on the side of a flat bed without using bedrails?: A Little Help needed moving to and from a bed to a chair (including a wheelchair)?: A Lot Help needed standing up from  a chair using your arms (e.g., wheelchair or bedside chair)?: A Lot Help needed to walk in hospital room?: A Lot Help needed climbing 3-5 steps with a railing? : Total 6 Click Score: 13    End of Session Equipment Utilized During Treatment: Gait belt Activity Tolerance: Patient tolerated treatment well Patient left: in bed;in chair;with bed alarm set;with call bell/phone within reach   PT Visit Diagnosis: Unsteadiness on feet (R26.81);Muscle weakness (generalized) (M62.81)    Time: [id number] PT Time Calculation (min) (ACUTE ONLY): 25 min   Charges:   PT Evaluation $PT Eval Moderate Complexity: 1 Mod            Carney Living PT DPT  11/27/2019, 12:56 PM

## 2019-11-27 NOTE — Evaluation (Addendum)
Clinical/Bedside Swallow Evaluation Patient Details  Name: Yolanda Flowers MRN: [id number] Date of Birth: 03/22/1941  Today's Date: 11/27/2019 Time: SLP Start Time (ACUTE ONLY): 4 SLP Stop Time (ACUTE ONLY): 1113 SLP Time Calculation (min) (ACUTE ONLY): 43 min  Past Medical History:  Past Medical History:  Diagnosis Date  . Anxiety   . Arthritis   . Asthma   . Bilateral edema of lower extremity   . Complication of anesthesia    difficulty waking  . Congestive heart failure (CHF) ([place])   . COPD (chronic obstructive pulmonary disease) ([place])   . Coronary artery disease   . Dyspnea on exertion   . GERD (gastroesophageal reflux disease)   . History of melanoma excision    left greast toe 2015  . History of squamous cell carcinoma excision    face--  multiple excisions  . History of subdural hematoma    2008  . Hypertension   . OSA (obstructive sleep apnea)    intolerant  . Thrombocytosis ([place])    takes hydoxyurea--  MONITORED BY DR Hinton [facility]ao [facility])  . VIN III (vulvar intraepithelial neoplasia III)   . Wears dentures    UPPER AND LOWER PARTIAL  . Wears glasses    Past Surgical History:  Past Surgical History:  Procedure Laterality Date  . ABDOMINAL HYSTERECTOMY  age 1  . CARDIAC CATHETERIZATION  03/172021  . CORONARY ARTERY BYPASS GRAFT N/A 11/04/2019   Procedure: CORONARY ARTERY BYPASS GRAFTING (CABG) x 4, with ENDOSCOPIC HARVESTING OF RIGHT GREATER SAPHENOUS VEIN.;  Surgeon: Gaye Pollack, MD;  Location: [place] OR;  Service: Open Heart Surgery;  Laterality: N/A;  . INTRAVASCULAR ULTRASOUND/IVUS N/A 11/03/2019   Procedure: Intravascular Ultrasound/IVUS;  Surgeon: Nelva Bush, MD;  Location: [place] CV LAB;  Service: Cardiovascular;  Laterality: N/A;  . KNEE ARTHROSCOPY Left 2004  . LEFT HEART CATH AND CORONARY ANGIOGRAPHY N/A 11/03/2019   Procedure: LEFT HEART CATH AND CORONARY ANGIOGRAPHY;  Surgeon: Nelva Bush, MD;  Location: [place] CV  LAB;  Service: Cardiovascular;  Laterality: N/A;  . MELANOMA EXCISION  2015   left great toe  . REPAIR PERONEAL TENDONS ANKLE  2004  . SUBDURAL HEMATOMA EVACUATION VIA CRANIOTOMY  2008      week later  post-op  [facility]  . TEE WITHOUT CARDIOVERSION N/A 11/04/2019   Procedure: TRANSESOPHAGEAL ECHOCARDIOGRAM (TEE);  Surgeon: Gaye Pollack, MD;  Location: [place];  Service: Open Heart Surgery;  Laterality: N/A;  . VULVECTOMY N/A 10/24/2015   Procedure: WIDE LOCAL EXCISION OF THE VULVA ;  Surgeon: Everitt Amber, MD;  Location: [place];  Service: Gynecology;  Laterality: N/A;   HPI:  79 Y/O F with PMX of VIN III, HTN, Thrombocytosis, HTN, OSA, hx of subdural hematoma, GERD, COPD, CAD, CHF presented with hx of LL weakness while she tried to go to the bathroom this morning. She denies any fall or LOC. She endorsed associated headache, no slurring of speech, or drooping of her face. She endorsed right LL swelling and pain following CABG harvesting. She is on A/B for cellulitis of her right LL. She denies chest pain, no SOB or cough. Recent hx of left vision changes for which her ophthalmologist recently evaluated her. 11/25/19 MRI brain indicated Scattered acute bilateral cerebral and cerebellar infarcts involving the corpus callosum genu, bilateral basal ganglia and left thalamus, likely embolic.  Small left frontal convexity subdural hematoma, likely subacute on chronic; BSE and SLE generated.  Assessment / Plan /  Recommendation Clinical Impression  Skilled assessment with intake of thin, puree, regular consistencies with pharyngeal symptoms including delayed cough in conjunction with pt c/o globus sensation during meals as this has "gotten worse" with frequent "coughing spells" during meals; head turn left with thin liquids assisted in eliminating this occurrence during BSE; recommend MBS to r/o dysphagia related to left pharyngeal weakness/strategies that may assist with consumption  and eliminate overt s/s of aspiration; determine phase of swallow impact and determine safest diet to consume post-CVA with hx of GERD and esophageal dilation x2; recommend continue current diet and complete MBS as able pending Rad scheduling.  Thank you for this consult. SLP Visit Diagnosis: Dysphagia, pharyngoesophageal phase (R13.14)    Aspiration Risk  Mild aspiration risk;Moderate aspiration risk    Diet Recommendation   Heart healthy/thin liquids  Medication Administration: Whole meds with liquid    Other  Recommendations Oral Care Recommendations: Oral care BID   Follow up Recommendations Inpatient Rehab      Frequency and Duration min 2x/week  1 week       Prognosis Prognosis for Safe Diet Advancement: Good      Swallow Study   General Date of Onset: 11/25/19 HPI: 79 Y/O F with PMX of VIN III, HTN, Thrombocytosis, HTN, OSA, hx of subdural hematoma, GERD, COPD, CAD, CHF presented with hx of LL weakness while she tried to go to the bathroom this morning. She denies any fall or LOC. She endorsed associated headache, no slurring of speech, or drooping of her face. She endorsed right LL swelling and pain following CABG harvesting. She is on A/B for cellulitis of her right LL. She denies chest pain, no SOB or cough. Recent hx of left vision changes for which her ophthalmologist recently evaluated her. Type of Study: Bedside Swallow Evaluation Previous Swallow Assessment: (Passed [facility] 11/25/19) Diet Prior to this Study: Regular;Thin liquids;Other (Comment)(heart healthy) Temperature Spikes Noted: No Respiratory Status: Room air History of Recent Intubation: No Behavior/Cognition: Alert;Cooperative;Pleasant mood Oral Cavity Assessment: Within Functional Limits Oral Care Completed by SLP: No Oral Cavity - Dentition: Adequate natural dentition Vision: Functional for self-feeding Self-Feeding Abilities: Able to feed self Patient Positioning: Upright in chair Baseline Vocal Quality:  Hoarse;Other (comment)(min) Volitional Cough: Strong Volitional Swallow: Able to elicit    Oral/Motor/Sensory Function Overall Oral Motor/Sensory Function: Mild impairment Facial ROM: Reduced left Facial Symmetry: Abnormal symmetry left Facial Strength: Reduced left Lingual ROM: Reduced left Lingual Symmetry: Abnormal symmetry left   Ice Chips Ice chips: Not tested   Thin Liquid Thin Liquid: Impaired Presentation: Cup;Straw Pharyngeal  Phase Impairments: Cough - Delayed Other Comments: (attempted head turn left with success)    Nectar Thick Nectar Thick Liquid: Not tested   Honey Thick Honey Thick Liquid: Not tested   Puree Puree: Impaired Presentation: Spoon Pharyngeal Phase Impairments: Cough - Delayed   Solid     Solid: Impaired Presentation: Self Fed Pharyngeal Phase Impairments: Cough - Delayed Other Comments: (hx of esophageal dilation x2; GERD)      Elvina Sidle, M.S., CCC-SLP 11/27/2019,11:22 AM

## 2019-11-27 NOTE — Evaluation (Signed)
Speech Language Pathology Evaluation Patient Details Name: Yolanda Flowers MRN: [id number] DOB: March 30, 1941 Today's Date: 11/27/2019 Time: [id number] SLP Time Calculation (min) (ACUTE ONLY): 43 min  Problem List:  Patient Active Problem List   Diagnosis Date Noted  . Cellulitis of right leg   . Abdominal pain   . Stroke due to embolism ([place]) 11/25/2019  . S/P CABG x 4 11/04/2019  . CAD (coronary artery disease) 10/25/2019  . Accelerating angina ([place]) 08/26/2019  . Vitamin D deficiency 08/24/2019  . Combined hyperlipidemia 08/24/2019  . Body mass index 29.0-29.9, adult 08/24/2019  . Vitamin B 12 deficiency 08/24/2019  . Malignant melanoma of great toe ([name]) 08/24/2019  . Pedal edema 08/24/2019  . Skin cancer 08/24/2019  . Squamous cell carcinoma, scalp/neck 08/24/2019  . Ischial bursitis, right 08/24/2019  . Swelling of limb 08/24/2019  . Renal insufficiency 08/24/2019  . Atherosclerosis of arteries 08/24/2019  . Dyspnea 02/09/2019  . Solitary pulmonary nodule on lung CT 02/09/2019  . Gastrointestinal bleeding 06/23/2018  . VIN III (vulvar intraepithelial neoplasia III) 10/09/2015  . Melanoma in situ ([place]) 06/29/2014  . Essential thrombocytosis ([place]) 06/22/2014  . COPD (chronic obstructive pulmonary disease) ([place]) 06/21/2014  . HTN (hypertension) 06/21/2014  . Allergic rhinitis 06/21/2014  . GERD (gastroesophageal reflux disease) 06/21/2014   Past Medical History:  Past Medical History:  Diagnosis Date  . Anxiety   . Arthritis   . Asthma   . Bilateral edema of lower extremity   . Complication of anesthesia    difficulty waking  . Congestive heart failure (CHF) ([place])   . COPD (chronic obstructive pulmonary disease) ([place])   . Coronary artery disease   . Dyspnea on exertion   . GERD (gastroesophageal reflux disease)   . History of melanoma excision    left greast toe 2015  . History of squamous cell carcinoma excision    face--  multiple excisions  . History of  subdural hematoma    2008  . Hypertension   . OSA (obstructive sleep apnea)    intolerant  . Thrombocytosis ([place])    takes hydoxyurea--  MONITORED BY DR Hinton Rao [facility])  . VIN III (vulvar intraepithelial neoplasia III)   . Wears dentures    UPPER AND LOWER PARTIAL  . Wears glasses    Past Surgical History:  Past Surgical History:  Procedure Laterality Date  . ABDOMINAL HYSTERECTOMY  age 51  . CARDIAC CATHETERIZATION  03/172021  . CORONARY ARTERY BYPASS GRAFT N/A 11/04/2019   Procedure: CORONARY ARTERY BYPASS GRAFTING (CABG) x 4, with ENDOSCOPIC HARVESTING OF RIGHT GREATER SAPHENOUS VEIN.;  Surgeon: Gaye Pollack, MD;  Location: [place] OR;  Service: Open Heart Surgery;  Laterality: N/A;  . INTRAVASCULAR ULTRASOUND/IVUS N/A 11/03/2019   Procedure: Intravascular Ultrasound/IVUS;  Surgeon: Nelva Bush, MD;  Location: [place] CV LAB;  Service: Cardiovascular;  Laterality: N/A;  . KNEE ARTHROSCOPY Left 2004  . LEFT HEART CATH AND CORONARY ANGIOGRAPHY N/A 11/03/2019   Procedure: LEFT HEART CATH AND CORONARY ANGIOGRAPHY;  Surgeon: Nelva Bush, MD;  Location: [place] CV LAB;  Service: Cardiovascular;  Laterality: N/A;  . MELANOMA EXCISION  2015   left great toe  . REPAIR PERONEAL TENDONS ANKLE  2004  . SUBDURAL HEMATOMA EVACUATION VIA CRANIOTOMY  2008      week later  post-op  [facility]  . TEE WITHOUT CARDIOVERSION N/A 11/04/2019   Procedure: TRANSESOPHAGEAL ECHOCARDIOGRAM (TEE);  Surgeon: Gaye Pollack, MD;  Location: [place];  Service:  Open Heart Surgery;  Laterality: N/A;  . VULVECTOMY N/A 10/24/2015   Procedure: WIDE LOCAL EXCISION OF THE VULVA ;  Surgeon: Everitt Amber, MD;  Location: [place];  Service: Gynecology;  Laterality: N/A;   HPI:  79 Y/O F with PMX of VIN III, HTN, Thrombocytosis, HTN, OSA, hx of subdural hematoma, GERD, COPD, CAD, CHF presented with hx of LL weakness while she tried to go to the bathroom this morning. She  denies any fall or LOC. She endorsed associated headache, no slurring of speech, or drooping of her face. She endorsed right LL swelling and pain following CABG harvesting. She is on A/B for cellulitis of her right LL. She denies chest pain, no SOB or cough. Recent hx of left vision changes for which her ophthalmologist recently evaluated her. MRI brain on 11/25/19 indicated Scattered acute bilateral cerebral and cerebellar infarcts involving the corpus callosum genu, bilateral basal ganglia and left thalamus, likely embolic.  Small left frontal convexity subdural hematoma, likely subacute on chronic.  Assessment / Plan / Recommendation Clinical Impression  Portions of MOCA administered with visual deficits taken into consideration and min verbal/visual cues required during examination;  pt oriented x4, naming/verbal expression, receptive language/auditory comprehension all WFL; cognition within functional limits with tasks assessed; pt able to identify deficits (balance/visual deficit (field cut/can see top half with left eye only); pt's speech 100% intelligible within complex conversation; pt only c/o globus sensation and "coughing spells" during meals paired with above deficits; MBS pending with swallowing strategies given to pt in interim.  ST will f/u for dysphagia related symptoms, but speech/language and cognition appear WFL at present time.  Thank you for this consult.    SLP Assessment  SLP Recommendation/Assessment: Patient needs continued Speech Lanaguage Pathology Services SLP Visit Diagnosis: Dysphagia, pharyngoesophageal phase (R13.14)    Follow Up Recommendations  Inpatient Rehab    Frequency and Duration min 2x/week  1 week      SLP Evaluation Cognition  Overall Cognitive Status: Within Functional Limits for tasks assessed Arousal/Alertness: Awake/alert Orientation Level: Oriented X4 Memory: Appears intact Awareness: Appears intact Problem Solving: Appears  intact Safety/Judgment: Appears intact       Comprehension  Auditory Comprehension Overall Auditory Comprehension: Appears within functional limits for tasks assessed Yes/No Questions: Within Functional Limits Commands: Within Functional Limits Conversation: Complex Interfering Components: Visual impairments EffectiveTechniques: Visual/Gestural cues Visual Recognition/Discrimination Discrimination: Within Function Limits Reading Comprehension Reading Status: Within funtional limits    Expression Expression Primary Mode of Expression: Verbal Verbal Expression Overall Verbal Expression: Appears within functional limits for tasks assessed Initiation: No impairment Level of Generative/Spontaneous Verbalization: Conversation Repetition: No impairment Naming: No impairment Pragmatics: No impairment Non-Verbal Means of Communication: Not applicable Written Expression Dominant Hand: Right Written Expression: Not tested   Oral / Motor  Oral Motor/Sensory Function Overall Oral Motor/Sensory Function: Mild impairment Facial ROM: Reduced left Facial Symmetry: Abnormal symmetry left Facial Strength: Reduced left Lingual ROM: Reduced left Lingual Symmetry: Abnormal symmetry left Motor Speech Overall Motor Speech: Appears within functional limits for tasks assessed Respiration: Within functional limits Phonation: Normal Resonance: Within functional limits Articulation: Within functional limitis Intelligibility: Intelligible Motor Planning: Witnin functional limits Motor Speech Errors: Not applicable                      Elvina Sidle, M.S., CCC-SLP 11/27/2019, 11:48 AM

## 2019-11-28 LAB — CBC
HCT: 37.1 % (ref 36.0–46.0)
Hemoglobin: 11.8 g/dL — ABNORMAL LOW (ref 12.0–15.0)
MCH: 32.5 pg (ref 26.0–34.0)
MCHC: 31.8 g/dL (ref 30.0–36.0)
MCV: 102.2 fL — ABNORMAL HIGH (ref 80.0–100.0)
Platelets: 154 10*3/uL (ref 150–400)
RBC: 3.63 MIL/uL — ABNORMAL LOW (ref 3.87–5.11)
RDW: 21.6 % — ABNORMAL HIGH (ref 11.5–15.5)
WBC: 16.9 10*3/uL — ABNORMAL HIGH (ref 4.0–10.5)
nRBC: 0.2 % (ref 0.0–0.2)

## 2019-11-28 LAB — BASIC METABOLIC PANEL
Anion gap: 10 (ref 5–15)
BUN: 13 mg/dL (ref 8–23)
CO2: 22 mmol/L (ref 22–32)
Calcium: 8.7 mg/dL — ABNORMAL LOW (ref 8.9–10.3)
Chloride: 108 mmol/L (ref 98–111)
Creatinine, Ser: 1.1 mg/dL — ABNORMAL HIGH (ref 0.44–1.00)
GFR calc Af Amer: 55 mL/min — ABNORMAL LOW (ref >60)
GFR calc non Af Amer: 48 mL/min — ABNORMAL LOW (ref >60)
Glucose, Bld: 99 mg/dL (ref 70–99)
Potassium: 4.1 mmol/L (ref 3.5–5.1)
Sodium: 140 mmol/L (ref 135–145)

## 2019-11-28 LAB — FOLATE RBC
Folate, Hemolysate: 532 ng/mL
Folate, RBC: 1542 ng/mL (ref >498)
Hematocrit: 34.5 % (ref 34.0–46.6)

## 2019-11-28 MED ORDER — ALUM & MAG HYDROXIDE-SIMETH 200-200-20 MG/5ML PO SUSP
30.0000 mL | Freq: Four times a day (QID) | ORAL | Status: DC | PRN
  Filled 2019-11-28: qty 30

## 2019-11-28 MED ORDER — METOPROLOL TARTRATE 25 MG PO TABS
25.0000 mg | ORAL_TABLET | Freq: Two times a day (BID) | ORAL | Status: DC
  Administered 2019-11-28 – 2019-11-30 (×5): 25 mg via ORAL
  Filled 2019-11-28 (×5): qty 1

## 2019-11-28 NOTE — Progress Notes (Signed)
Family Medicine Teaching Service Daily Progress Note Intern Pager: [phone]  Patient name: Yolanda Flowers Medical record number: [id number] Date of birth: 01-22-1941 Age: 79 y.o. Gender: female  Primary Care Provider: [name], MD Consultants: Neuro, Cardio Code Status: DNR  Pt Overview and Major Events to Date:  4/8 Admitted, Neuro + Cardio consulted 4/9 LE doppler US  Assessment and Plan: Yolanda Flowers is a 79 y.o. female presenting with left arm weakness, headache, and vision changes. PMH is significant for HTN, COPD, GERD, h/o subdural hematoma in 2008, HLD, CAD s/p CABG 11/04/2019, thrombocytosis, cellulitis, melanoma in situ + SCC, vulvar intraepithelial neoplasia III in 2017.  Acute CVA, bilateral cerebral and cerebellar infarcts, suspected embolic source: Stable. Improving right-sided weakness.  Unclear if 2/2 occult A. Fib vs aortic crossclamping during recent CABG.  EP to place a loop recorder during admission.  Awaiting CIR. -Continue Lipitor 80 mg, ASA 81 mg, and Plavix 75 mg daily -Appreciated neurology recommendations, patient to follow-up O/P at [facility] in 4 weeks -EP/cardiology on board, to place a loop recorder this week (?4/12)  LE wound cellulitis: Resolved. S/p 5 days of doxycyline. LE doppler negative for DVT. Non infectious scab healing.   Abdominal discomfort with diarrhea: Improved. No BM since yesterday a.m., 4/10.  Abdominal pain significantly improved. -Monitor for symptoms -Medications with snacks -Will obtain GI panel/C. Diff if diarrhea reoccurs persistently  CKD stage IIIa: Stable. Cr 1.10 today, around baseline.  -Monitor BMP  Elevated troponin  CAD s/p CABG 11/03/2019: Stable. Peak trop 275 on 4/8, cardiology/CVTS on board and stable from cardiac perspective. -Continue DAPT and atorvastatin as above -Appreciate cardiology recommendations -Restart home metoprolol, out of permissive hypertensive window  Hyperlipidemia: Chronic,  stable. -Continue atorvastatin 80 mg  Hypertension: Chronic, stable. SBP 120-150s.  Will restart home metoprolol as out of permissive hypertension window. -Restart metoprolol 25 mg twice daily  Essential thrombocytopenia: Chronic, stable. Platelets at baseline, 154 today. -Continue home hydroxyurea 500 mg daily -Monitor CBC   Macrocytic Anemia: Chronic. Stable. Hemoglobin at baseline, around 11. -Continue home B12 supplementation -Monitor CBC  Leukocytosis: Chronic, stable.   WBC 16.9.  Should follow-up with hematology/oncology on a regular basis, especially with concurrent abnormalities as above.  No evidence of infection. -Monitor CBC  FEN/GI: Miralax Prophylaxis: Lovenox  Disposition: Medically stable, awaiting CIR placement  Subjective:  Doing well this morning.  States her abdominal pain has significantly improved, has not had a bowel movement since yesterday morning.  Was watery at that time.  She feels her strength getting a little bit better after working with PT/OT, set up in the chair for 2 hours yesterday and felt great with this.  Objective: Temp:  [97.8 F (36.6 C)-98.3 F (36.8 C)] 97.8 F (36.6 C) (04/11 0327) Pulse Rate:  [92-107] 92 (04/11 0327) Resp:  [14-18] 14 (04/11 0327) BP: (128-151)/(80-88) 151/81 (04/11 0327) SpO2:  [95 %-100 %] 95 % (04/11 0327)  Physical Exam: General: Older female in no acute distress, alert. HEENT: NCAT, MMM Cardiac: RRR no m/g/r Lungs: Clear bilaterally, no increased WOB  Abdomen: soft, non-tender Neuro: Alert and oriented.  Smile symmetrical with speech normal.  Follows commands without concern.  4/5 strength in right leg hip flexion, otherwise 5/5 upper and lower extremity strength.  Mild right-sided heel-to-shin ataxia still present. Ext: Warm, dry, 2+ distal pulses, no edema    Laboratory: Recent Labs  Lab 11/26/19 0418 11/27/19 0443 11/28/19 0426  WBC 18.7* 16.9* 16.9*  HGB 11.5* 11.3* 11.8*  HCT 35.6*  35.7* 37.1  PLT 145* 151 154   Recent Labs  Lab 11/24/19 1058 11/24/19 1058 11/25/19 0706 11/25/19 0706 11/26/19 0418 11/27/19 0443 11/28/19 0426  NA 142  --  141   < > 142 140 140  K 5.1   < > 4.5   < > 4.2 4.0 4.1  CL 103   < > 104   < > 106 106 108  CO2 22   < > 25   < > 22 22 22   BUN 17  --  12   < > 12 9 13   CREATININE 1.24*   < > 1.15*   < > 1.20* 1.03* 1.10*  CALCIUM 9.5   < > 9.4   < > 8.8* 8.8* 8.7*  PROT 7.5  --  7.5  --  7.2  --   --   BILITOT 0.5  --  0.8  --  1.0  --   --   ALKPHOS 109  --  76  --  76  --   --   ALT 14  --  15  --  15  --   --   AST 27  --  27  --  21  --   --   GLUCOSE 114*  --  103*   < > 85 81 99   < > = values in this interval not displayed.    Imaging/Diagnostic Tests: Vascular US LE RIGHT:  - There is no evidence of deep vein thrombosis in the lower extremity.   - No cystic structure found in the popliteal fossa.    LEFT:  - There is no evidence of deep vein thrombosis in the lower extremity.   - No cystic structure found in the popliteal fossa.   Patriciaann Clan, DO 11/28/2019, 8:11 AM PGY-2, [place] Intern pager: [phone], text pages welcome

## 2019-11-28 NOTE — Progress Notes (Signed)
  Speech Language Pathology Treatment: Dysphagia  Patient Details Name: Yolanda Flowers MRN: [id number] DOB: [birth year] Today's Date: 11/28/2019 Time: [id number] SLP Time Calculation (min) (ACUTE ONLY): 14 min  Assessment / Plan / Recommendation Clinical Impression  Pt seen with solids and liquids after initial swallow assessment yesterday. SLP suspects esophageal versus pharyngeal source for dysphagia. She continues to report frequent pharyngeal globus sensation and frequent coughing when she phonates as well as during meals. Water trials resulted in immediate throat clear, and delayed cough however he coughed after initiating speech on four occasions and stated something makes her "cough when I talk." She has a history of GERD, esophageal dilation and COPD and symptoms may be due to different etiologies.  MBS would be beneficial however can be performed tomorrow. Continue regular/thin until that time and provided education re: swallow/reflux precautions   HPI HPI: 79 Y/O F with PMX of VIN III, HTN, Thrombocytosis, HTN, OSA, hx of subdural hematoma, GERD, esophageal dilation x 2 (5 yrs and 2 years ago), COPD, CAD, CHF presented with hx of LL weakness while she tried to go to the bathroom this morning. She denies any fall or LOC. She endorsed associated headache, no slurring of speech, or drooping of her face. She endorsed right LL swelling and pain following CABG harvesting. She is on A/B for cellulitis of her right LL. She denies chest pain, no SOB or cough. Recent hx of left vision changes for which her ophthalmologist recently evaluated her.      SLP Plan  Continue with current plan of care       Recommendations  Diet recommendations: Regular;Thin liquid Liquids provided via: Straw;Cup Medication Administration: Whole meds with liquid Supervision: Patient able to self feed Compensations: Slow rate;Small sips/bites Postural Changes and/or Swallow Maneuvers: Seated upright 90  degrees;Upright 30-60 min after meal                General recommendations: Rehab consult Oral Care Recommendations: Oral care BID Follow up Recommendations: Inpatient Rehab SLP Visit Diagnosis: Dysphagia, unspecified (R13.10) Plan: Continue with current plan of care       GO                Houston Siren 11/28/2019, 11:09 AM Orbie Pyo Colvin Caroli.Ed Risk analyst  [phone] Office [phone]

## 2019-11-29 ENCOUNTER — Telehealth: Payer: Self-pay | Admitting: Cardiology

## 2019-11-29 ENCOUNTER — Inpatient Hospital Stay (HOSPITAL_COMMUNITY): Payer: PPO

## 2019-11-29 DIAGNOSIS — I2510 Atherosclerotic heart disease of native coronary artery without angina pectoris: Secondary | ICD-10-CM

## 2019-11-29 DIAGNOSIS — E785 Hyperlipidemia, unspecified: Secondary | ICD-10-CM

## 2019-11-29 DIAGNOSIS — I10 Essential (primary) hypertension: Secondary | ICD-10-CM

## 2019-11-29 LAB — BASIC METABOLIC PANEL
Anion gap: 11 (ref 5–15)
BUN: 13 mg/dL (ref 8–23)
CO2: 23 mmol/L (ref 22–32)
Calcium: 9.2 mg/dL (ref 8.9–10.3)
Chloride: 107 mmol/L (ref 98–111)
Creatinine, Ser: 1.1 mg/dL — ABNORMAL HIGH (ref 0.44–1.00)
GFR calc Af Amer: 55 mL/min — ABNORMAL LOW (ref >60)
GFR calc non Af Amer: 48 mL/min — ABNORMAL LOW (ref >60)
Glucose, Bld: 97 mg/dL (ref 70–99)
Potassium: 4.3 mmol/L (ref 3.5–5.1)
Sodium: 141 mmol/L (ref 135–145)

## 2019-11-29 LAB — CBC WITH DIFFERENTIAL/PLATELET
Abs Immature Granulocytes: 1.33 10*3/uL — ABNORMAL HIGH (ref 0.00–0.07)
Basophils Absolute: 0.4 10*3/uL — ABNORMAL HIGH (ref 0.0–0.1)
Basophils Relative: 2 %
Eosinophils Absolute: 0.7 10*3/uL — ABNORMAL HIGH (ref 0.0–0.5)
Eosinophils Relative: 4 %
HCT: 38.5 % (ref 36.0–46.0)
Hemoglobin: 12.1 g/dL (ref 12.0–15.0)
Immature Granulocytes: 7 %
Lymphocytes Relative: 9 %
Lymphs Abs: 1.7 10*3/uL (ref 0.7–4.0)
MCH: 32.5 pg (ref 26.0–34.0)
MCHC: 31.4 g/dL (ref 30.0–36.0)
MCV: 103.5 fL — ABNORMAL HIGH (ref 80.0–100.0)
Monocytes Absolute: 1.8 10*3/uL — ABNORMAL HIGH (ref 0.1–1.0)
Monocytes Relative: 9 %
Neutro Abs: 12.8 10*3/uL — ABNORMAL HIGH (ref 1.7–7.7)
Neutrophils Relative %: 69 %
Platelets: 177 10*3/uL (ref 150–400)
RBC: 3.72 MIL/uL — ABNORMAL LOW (ref 3.87–5.11)
RDW: 21.5 % — ABNORMAL HIGH (ref 11.5–15.5)
WBC: 18.7 10*3/uL — ABNORMAL HIGH (ref 4.0–10.5)
nRBC: 0.3 % — ABNORMAL HIGH (ref 0.0–0.2)

## 2019-11-29 NOTE — Progress Notes (Signed)
Physical Therapy Treatment Patient Details Name: Yolanda Flowers MRN: [id number] DOB: Jan 17, 1941 Today's Date: 11/29/2019    History of Present Illness Patient is a 79 year old female who presented to the hospital with increased syncope and pushing to the right. 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 She had a CABG x3 weeks prior. She had returned home and was working on progressing her mobility. PMH: hx of subdural hematoma; L eye blindness following the CABG, anxiety, CAD, bilateral LE edema.     PT Comments    Patient is making progress toward PT goals and tolerated session well. Pt is eager to continue therapy. Continue to recommend CIR for further skilled PT services to maximize independence and safety with mobility.    Follow Up Recommendations  CIR     Equipment Recommendations  Other (comment)(defer to post acute rehab)    Recommendations for Other Services       Precautions / Restrictions Precautions Precautions: Sternal Precaution Comments: per patient still follwing sternal percautions     Mobility  Bed Mobility Overal bed mobility: Needs Assistance Bed Mobility: Supine to Sit     Supine to sit: Min guard     General bed mobility comments: Increased time and use of rail.  Transfers Overall transfer level: Needs assistance Equipment used: Rolling walker (2 wheeled);1 person hand held assist Transfers: Sit to/from Stand Sit to Stand: Min assist         General transfer comment: assist to power up and balance; max cues and assistance when going to sit down as pt begins sitting prematurely and has difficulty with sequencing   Ambulation/Gait Ambulation/Gait assistance: Mod assist;Min assist Gait Distance (Feet): (4 ft with HHA and 12 ft with RW ) Assistive device: Rolling walker (2 wheeled);1 person hand held assist Gait Pattern/deviations: Step-to pattern;Step-through pattern;Decreased step length - right;Decreased  step length - left;Decreased dorsiflexion - right;Trunk flexed Gait velocity: decreased   General Gait Details: cues for sequencing, weigth shifting, and use of AD; assistance required for balance and managing RW    Stairs             Wheelchair Mobility    Modified Rankin (Stroke Patients Only) Modified Rankin (Stroke Patients Only) Pre-Morbid Rankin Score: No symptoms Modified Rankin: Moderately severe disability     Balance Overall balance assessment: Needs assistance Sitting-balance support: Bilateral upper extremity supported Sitting balance-Leahy Scale: Fair     Standing balance support: Bilateral upper extremity supported Standing balance-Leahy Scale: Poor                              Cognition Arousal/Alertness: Awake/alert Behavior During Therapy: WFL for tasks assessed/performed Overall Cognitive Status: Within Functional Limits for tasks assessed                                        Exercises      General Comments        Pertinent Vitals/Pain Pain Assessment: No/denies pain    Home Living                      Prior Function            PT Goals (current goals can now be found in the care plan section) Progress towards PT goals: Progressing toward goals  Frequency    Min 3X/week      PT Plan Current plan remains appropriate    Co-evaluation              AM-PAC PT "6 Clicks" Mobility   Outcome Measure  Help needed turning from your back to your side while in a flat bed without using bedrails?: A Little Help needed moving from lying on your back to sitting on the side of a flat bed without using bedrails?: A Little Help needed moving to and from a bed to a chair (including a wheelchair)?: A Little Help needed standing up from a chair using your arms (e.g., wheelchair or bedside chair)?: A Little Help needed to walk in hospital room?: A Lot Help needed climbing 3-5 steps with a railing?  : Total 6 Click Score: 15    End of Session Equipment Utilized During Treatment: Gait belt Activity Tolerance: Patient tolerated treatment well Patient left: in chair;with call bell/phone within reach;with chair alarm set;with family/visitor present Nurse Communication: Mobility status PT Visit Diagnosis: Unsteadiness on feet (R26.81);Muscle weakness (generalized) (M62.81)     Time: [id number] PT Time Calculation (min) (ACUTE ONLY): 37 min  Charges:  $Gait Training: 23-37 mins                     Earney Navy, PTA Acute Rehabilitation Services Pager: [phone] Office: [phone]     Darliss Cheney 11/29/2019, 6:02 PM

## 2019-11-29 NOTE — Progress Notes (Signed)
Family Medicine Teaching Service Daily Progress Note Intern Pager: [phone]  Patient name: Yolanda Flowers Medical record number: [id number] Date of birth: Sep 05, 1940 Age: 79 y.o. Gender: female  Primary Care Provider: [name], MD Consultants: Neuro/cardio Code Status: DNR  Pt Overview and Major Events to Date:  04/08-admitted  Assessment and Plan: Yolanda Flowers a 79 y.o.femalepresenting with left arm weakness, headache, and vision changes. PMH is significant forHTN, COPD, GERD, h/o subdural hematoma in 2008,HLD, CAD s/p CABG 11/04/2019, thrombocytosis, cellulitis, melanoma in situ + SCC, vulvar intraepithelial neoplasia III in 2017.  Acute CVA, bilateral cerebral and cerebellar infarcts, suspected embolic source: Stable. Weakness on right side improving.  Etiology remains unclear. -Continue Lipitor 80 mg daily -New ASA 81 mg daily -Continue Plavix 75 mg daily -Neurology recommendations to follow-up with patient outpatient at [facility] in 4 weeks -EP/cardiology following, plan for loop recorder today. -Patient awaiting CIR  CKD stage IIIa: Stable. Chronic.  Stable.  Creatinine 1.10 (4/12) -Avoid nephrotoxic agents  CAD s/p CABG 11/03/2019:  Chronic.  Stable -Continue dual antiplatelet therapy -Continue statin -Cardiology following appreciate recommendations -Continue metoprolol 25 mg twice daily  Hyperlipidemia:  Chronic.  Stable -Atorvastatin 80 mg daily  Hypertension:  Chronic.  Stable.  SBP 125-132 -Continue metoprolol 25 mg twice daily  Essential thrombocytopenia:  Chronic.  Stable.  No evidence of bleeding -Continue hydroxyurea 500 mg daily -Follow-up heme-onc outpatient, patient will need referral  Macrocytic Anemia:  Chronic.  Stable. -Continue multivitamins  Leukocytosis: Chronic, stable.   Chronic.  Stable.  No evidence of infection -Follow-up heme-onc outpatient, patient will need referral  FEN/GI: -Heart  healthy Prophylaxis: -Lovenox   Disposition: CIR  Subjective:  No acute events overnight.  Patient reports slept well.  Denies any chest pain, shortness of breath or abdominal pain.  Objective: Temp:  [97.6 F (36.4 C)-98.2 F (36.8 C)] 98.2 F (36.8 C) (04/12 1159) Pulse Rate:  [66-76] 71 (04/12 1159) Resp:  [17-18] 17 (04/12 1159) BP: (110-125)/(73-86) 113/75 (04/12 1159) SpO2:  [93 %-95 %] 95 % (04/12 1159)   Physical Exam: General: Alert and oriented, no apparent distress  Cardiovascular: RRR with no murmurs noted Respiratory: CTA bilaterally  Gastrointestinal: Bowel sounds present. No abdominal pain Extremities: Moving all extremities, no lower extremity edema   Laboratory: Recent Labs  Lab 11/27/19 0443 11/28/19 0426 11/29/19 0412  WBC 16.9* 16.9* 18.7*  HGB 11.3* 11.8* 12.1  HCT 35.7* 37.1 38.5  PLT 151 154 177   Recent Labs  Lab 11/24/19 1058 11/24/19 1058 11/25/19 0706 11/25/19 0706 11/26/19 0418 11/26/19 0418 11/27/19 0443 11/28/19 0426 11/29/19 0412  NA 142  --  141   < > 142   < > 140 140 141  K 5.1   < > 4.5   < > 4.2   < > 4.0 4.1 4.3  CL 103   < > 104   < > 106   < > 106 108 107  CO2 22   < > 25   < > 22   < > 22 22 23   BUN 17  --  12   < > 12   < > 9 13 13   CREATININE 1.24*   < > 1.15*   < > 1.20*   < > 1.03* 1.10* 1.10*  CALCIUM 9.5   < > 9.4   < > 8.8*   < > 8.8* 8.7* 9.2  PROT 7.5  --  7.5  --  7.2  --   --   --   --  BILITOT 0.5  --  0.8  --  1.0  --   --   --   --   ALKPHOS 109  --  76  --  76  --   --   --   --   ALT 14  --  15  --  15  --   --   --   --   AST 27  --  27  --  21  --   --   --   --   GLUCOSE 114*  --  103*   < > 85   < > 81 99 97   < > = values in this interval not displayed.      Imaging/Diagnostic Tests: DG Swallowing Func-Speech Pathology  Result Date: 11/29/2019 Objective Swallowing Evaluation: Type of Study: MBS-Modified Barium Swallow Study  Patient Details Name: Yolanda Flowers MRN: [id number] Date of  Birth: 10/06/1940 Today's Date: 11/29/2019 Time: SLP Start Time (ACUTE ONLY): 0910 -SLP Stop Time (ACUTE ONLY): 0930 SLP Time Calculation (min) (ACUTE ONLY): 20 min Past Medical History: Past Medical History: Diagnosis Date . Anxiety  . Arthritis  . Asthma  . Bilateral edema of lower extremity  . Complication of anesthesia   difficulty waking . Congestive heart failure (CHF) ([place])  . COPD (chronic obstructive pulmonary disease) ([place])  . Coronary artery disease  . Dyspnea on exertion  . GERD (gastroesophageal reflux disease)  . History of melanoma excision   left greast toe 2015 . History of squamous cell carcinoma excision   face--  multiple excisions . History of subdural hematoma   2008 . Hypertension  . OSA (obstructive sleep apnea)   intolerant . Thrombocytosis ([place])   takes hydoxyurea--  MONITORED BY DR Hinton Rao [facility]) . VIN III (vulvar intraepithelial neoplasia III)  . Wears dentures   UPPER AND LOWER PARTIAL . Wears glasses  Past Surgical History: Past Surgical History: Procedure Laterality Date . ABDOMINAL HYSTERECTOMY  age 57 . CARDIAC CATHETERIZATION  03/172021 . CORONARY ARTERY BYPASS GRAFT N/A 11/04/2019  Procedure: CORONARY ARTERY BYPASS GRAFTING (CABG) x 4, with ENDOSCOPIC HARVESTING OF RIGHT GREATER SAPHENOUS VEIN.;  Surgeon: Gaye Pollack, MD;  Location: [place] [facility];  Service: Open Heart Surgery;  Laterality: N/A; . INTRAVASCULAR ULTRASOUND/IVUS N/A 11/03/2019  Procedure: Intravascular Ultrasound/IVUS;  Surgeon: Nelva Bush, MD;  Location: [name] [facility];  Service: Cardiovascular;  Laterality: N/A; . KNEE ARTHROSCOPY Left 2004 . LEFT HEART CATH AND CORONARY ANGIOGRAPHY N/A 11/03/2019  Procedure: LEFT HEART CATH AND CORONARY ANGIOGRAPHY;  Surgeon: Nelva Bush, MD;  Location: [place] [facility];  Service: Cardiovascular;  Laterality: N/A; . MELANOMA EXCISION  2015  left great toe . REPAIR PERONEAL TENDONS ANKLE  2004 . SUBDURAL HEMATOMA EVACUATION VIA CRANIOTOMY  2008     week  later  post-op  [facility] . TEE WITHOUT CARDIOVERSION N/A 11/04/2019  Procedure: TRANSESOPHAGEAL ECHOCARDIOGRAM (TEE);  Surgeon: Gaye Pollack, MD;  Location: [place];  Service: Open Heart Surgery;  Laterality: N/A; . VULVECTOMY N/A 10/24/2015  Procedure: WIDE LOCAL EXCISION OF THE VULVA ;  Surgeon: Everitt Amber, MD;  Location: [place];  Service: Gynecology;  Laterality: N/A; HPI: 79 Y/O F with PMX of VIN III, HTN, Thrombocytosis, HTN, OSA, hx of subdural hematoma, GERD, COPD, CAD, CHF presented with hx of LL weakness while she tried to go to the bathroom this morning. She denies any fall or LOC. She endorsed associated headache, no slurring of speech,  or drooping of her face. She endorsed right LL swelling and pain following CABG harvesting. She is on A/B for cellulitis of her right LL. She denies chest pain, no SOB or cough. Recent hx of left vision changes for which her ophthalmologist recently evaluated her.  No data recorded Assessment / Plan / Recommendation CHL IP CLINICAL IMPRESSIONS 11/29/2019 Clinical Impression Pt demonstrates adequate laryngeal closure with slightly incomplete vestibular closure leading to very trace silent frank penetration events during the swallow. In most cases, penetrate is ejected with subsequent swallow, and despite lack of cough or throat clear response no barium passed into the subglottis. A cue for a light throat clear consistently ejected penetrate and a deep chin tuck with a straw prevented pentration (head turn ineffective). However, pt likely to remain safe from significant aspiration even if no interventions are implemented. Deficts may even be more baseline and WNL for age rather than acute. Esophageal sweep unrevealing. Recommend pt continue a regular diet and thin liquids with f/u education for a throat clear strategy or a chin tuck strategy if coughing continues to occur during meals.  SLP Visit Diagnosis Dysphagia, unspecified (R13.10) Attention  and concentration deficit following -- Frontal lobe and executive function deficit following -- Impact on safety and function --   CHL IP TREATMENT RECOMMENDATION 11/29/2019 Treatment Recommendations Therapy as outlined in treatment plan below   Prognosis 11/29/2019 Prognosis for Safe Diet Advancement Good Barriers to Reach Goals -- Barriers/Prognosis Comment -- CHL IP DIET RECOMMENDATION 11/29/2019 SLP Diet Recommendations Regular solids;Thin liquid Liquid Administration via Cup;Straw Medication Administration Whole meds with liquid Compensations Slow rate;Small sips/bites;Chin tuck;Clear throat intermittently Postural Changes --   CHL IP OTHER RECOMMENDATIONS 11/29/2019 Recommended Consults -- Oral Care Recommendations Oral care BID Other Recommendations --   CHL IP FOLLOW UP RECOMMENDATIONS 11/29/2019 Follow up Recommendations Inpatient Rehab   CHL IP FREQUENCY AND DURATION 11/29/2019 Speech Therapy Frequency (ACUTE ONLY) min 2x/week Treatment Duration 1 week      CHL IP ORAL PHASE 11/29/2019 Oral Phase WFL Oral - Pudding Teaspoon -- Oral - Pudding Cup -- Oral - Honey Teaspoon -- Oral - Honey Cup -- Oral - Nectar Teaspoon -- Oral - Nectar Cup -- Oral - Nectar Straw -- Oral - Thin Teaspoon -- Oral - Thin Cup -- Oral - Thin Straw -- Oral - Puree -- Oral - Mech Soft -- Oral - Regular -- Oral - Multi-Consistency -- Oral - Pill -- Oral Phase - Comment --  CHL IP PHARYNGEAL PHASE 11/29/2019 Pharyngeal Phase Impaired Pharyngeal- Pudding Teaspoon -- Pharyngeal -- Pharyngeal- Pudding Cup -- Pharyngeal -- Pharyngeal- Honey Teaspoon -- Pharyngeal -- Pharyngeal- Honey Cup -- Pharyngeal -- Pharyngeal- Nectar Teaspoon -- Pharyngeal -- Pharyngeal- Nectar Cup -- Pharyngeal -- Pharyngeal- Nectar Straw -- Pharyngeal -- Pharyngeal- Thin Teaspoon -- Pharyngeal -- Pharyngeal- Thin Cup Penetration/Aspiration during swallow;Reduced epiglottic inversion;Compensatory strategies attempted (with notebox) Pharyngeal Material enters airway, CONTACTS  cords and not ejected out;Material enters airway, remains ABOVE vocal cords and not ejected out;Material does not enter airway;Material enters airway, CONTACTS cords and then ejected out Pharyngeal- Thin Straw Penetration/Aspiration during swallow;Compensatory strategies attempted (with notebox) Pharyngeal Material enters airway, CONTACTS cords and then ejected out;Material enters airway, CONTACTS cords and not ejected out;Material enters airway, remains ABOVE vocal cords and not ejected out;Material does not enter airway;Material enters airway, remains ABOVE vocal cords then ejected out Pharyngeal- Puree -- Pharyngeal -- Pharyngeal- Mechanical Soft -- Pharyngeal -- Pharyngeal- Regular -- Pharyngeal -- Pharyngeal- Multi-consistency -- Pharyngeal -- Pharyngeal- Pill --  Pharyngeal -- Pharyngeal Comment --  CHL IP CERVICAL ESOPHAGEAL PHASE 11/29/2019 Cervical Esophageal Phase WFL Pudding Teaspoon -- Pudding Cup -- Honey Teaspoon -- Honey Cup -- Nectar Teaspoon -- Nectar Cup -- Nectar Straw -- Thin Teaspoon -- Thin Cup -- Thin Straw -- Puree -- Mechanical Soft -- Regular -- Multi-consistency -- Pill -- Cervical Esophageal Comment -- Herbie Baltimore, MA CCC-SLP Acute Rehabilitation Services Pager [phone] Office [phone] Lynann Beaver 11/29/2019, 10:26 AM               Carollee Leitz, MD 11/29/2019, 5:00 PM PGY-1, [place] Intern pager: [phone], text pages welcome

## 2019-11-29 NOTE — Progress Notes (Signed)
Family Medicine Teaching Service Daily Progress Note Intern Pager: [phone]  Patient name: Yolanda Flowers Medical record number: [id number] Date of birth: [birth year] Age: 79 y.o. Gender: female  Primary Care Provider: [name], MD Consultants: Neuro/cardio Code Status: DNR  Pt Overview and Major Events to Date:  04/08-admitted  Assessment and Plan: Yolanda Flowers a 79 y.o.femalepresenting with left arm weakness, headache, and vision changes. PMH is significant forHTN, COPD, GERD, h/o subdural hematoma in 2008,HLD, CAD s/p CABG 11/04/2019, thrombocytosis, cellulitis, melanoma in situ + SCC, vulvar intraepithelial neoplasia III in 2017.  Acute CVA, bilateral cerebral and cerebellar infarcts, suspected embolic source: Stable. Right-sided weakness improving.  Still unclear if acute CVA resulted from occult A. fib versus aortic crossclamping during recent CABG. -Continue Lipitor 80 mg, ASA 81 mg and Plavix 75 mg daily -Patient awaiting CIR -Appreciate recommendations from neurology, patient will follow-up outpatient at [facility] in 4 weeks -EP/cardiology following, will place loop recorder 04/13   Abdominal discomfort with diarrhea: Improved. Small bowel movement overnight however not diarrhea.  Abdominal pain resolved. -Continue to monitor  CKD stage IIIa: Stable. Creatinine remains at 1.10 today which is around baseline -Avoid nephrotoxic medications -BMP in a.m.  Elevated troponin  CAD s/p CABG 11/03/2019: Stable. Resolved.  No reports of chest pain.  Cardiology/CVTS following and patient stable from cardiac perspective. -Continue dual antiplatelet therapy and atorvastatin -Appreciate recommendations from cardiology -Continue metoprolol 25 mg twice daily  Hyperlipidemia:  Chronic.  Stable. -Continue atorvastatin 80 mg daily   Hypertension:  Chronic.  Stable.  SBP 116-125. -Continue metoprolol 25 mg twice daily  Essential thrombocytopenia:  Chronic.  Stable.   Platelets 177 today. -Continue hydroxyurea 500 mg daily -CBC in a.m.  Macrocytic Anemia:  Chronic.  Stable.  Hemoglobin 12.1 today -Continue to monitor -ContinueTrinsicon/Foltrin vitamins -CBC in a.m.  Leukocytosis: Chronic, stable.   WBC 18.7.  Should follow-up with hematology/oncology on a regular basis, especially with concurrent abnormalities as above.  No evidence of infection. -Monitor CBC  FEN/GI: -Heart healthy Prophylaxis: -Lovenox    Disposition: CIR  Subjective:  No acute events overnight.  Patient reports did not sleep well overnight due to noise.  Denies any chest pain, shortness of breath, abdominal pain.  No nausea or vomiting.  Appetite good.  Feels like she has improved.  Objective: Temp:  [97.6 F (36.4 C)-98.5 F (36.9 C)] 98 F (36.7 C) (04/12 0407) Pulse Rate:  [66-101] 69 (04/12 0407) Resp:  [18] 18 (04/12 0407) BP: (114-149)/(73-96) 116/86 (04/12 0407) SpO2:  [93 %-96 %] 93 % (04/12 0407)   Physical Exam: General: Alert and oriented, no apparent distress  Cardiovascular: RRR with no murmurs noted Respiratory: CTA bilaterally  Gastrointestinal: Bowel sounds present. No abdominal pain Extremities: No lower extremity edema noted.  Neuro: Motor and sensory intact, no facial droop, speech clear.   Laboratory: Recent Labs  Lab 11/27/19 0443 11/28/19 0426 11/29/19 0412  WBC 16.9* 16.9* 18.7*  HGB 11.3* 11.8* 12.1  HCT 35.7* 37.1 38.5  PLT 151 154 177   Recent Labs  Lab 11/24/19 1058 11/24/19 1058 11/25/19 0706 11/25/19 0706 11/26/19 0418 11/26/19 0418 11/27/19 0443 11/28/19 0426 11/29/19 0412  NA 142  --  141   < > 142   < > 140 140 141  K 5.1   < > 4.5   < > 4.2   < > 4.0 4.1 4.3  CL 103   < > 104   < > 106   < > 106 108  107  CO2 22   < > 25   < > 22   < > 22 22 23   BUN 17  --  12   < > 12   < > 9 13 13   CREATININE 1.24*   < > 1.15*   < > 1.20*   < > 1.03* 1.10* 1.10*  CALCIUM 9.5   < > 9.4   < > 8.8*   < > 8.8* 8.7* 9.2   PROT 7.5  --  7.5  --  7.2  --   --   --   --   BILITOT 0.5  --  0.8  --  1.0  --   --   --   --   ALKPHOS 109  --  76  --  76  --   --   --   --   ALT 14  --  15  --  15  --   --   --   --   AST 27  --  27  --  21  --   --   --   --   GLUCOSE 114*  --  103*   < > 85   < > 81 99 97   < > = values in this interval not displayed.      Imaging/Diagnostic Tests: No results found.  Carollee Leitz, MD 11/29/2019, 5:59 AM PGY-1, [place] Intern pager: [phone], text pages welcome

## 2019-11-29 NOTE — Progress Notes (Signed)
Progress Note  Patient Name: Yolanda Flowers Date of Encounter: 11/29/2019  Primary Cardiologist: Jenean Lindau, MD   Subjective   She continues to have a cough, though noted swallow study was normal. She is disappointed as her insurance is recommending a rehab facility not at [facility]. She denies chest pain or new neuro symptoms. Breathing is stable. Not sleeping well in the hospital.  Inpatient Medications    Scheduled Meds:  aspirin EC  81 mg Oral Daily   atorvastatin  80 mg Oral q1800   clopidogrel  75 mg Oral Daily   enoxaparin (LOVENOX) injection  40 mg Subcutaneous Q24H   ferrous [id number] C-folic acid  1 capsule Oral BID PC   hydroxyurea  500 mg Oral Daily   metoprolol tartrate  25 mg Oral BID   pantoprazole  40 mg Oral Daily   Continuous Infusions:  PRN Meds: acetaminophen **OR** acetaminophen, alum & mag hydroxide-simeth, polyethylene glycol   Vital Signs    Vitals:   11/29/19 0014 11/29/19 0407 11/29/19 0941 11/29/19 1159  BP: 125/73 116/86 110/76 113/75  Pulse: 66 69 76 71  Resp: 18 18 18 17   Temp: 97.6 F (36.4 C) 98 F (36.7 C) 98.2 F (36.8 C) 98.2 F (36.8 C)  TempSrc: Oral Oral Oral Oral  SpO2: 94% 93% 95% 95%  Weight:      Height:        Intake/Output Summary (Last 24 hours) at 11/29/2019 1237 Last data filed at 11/29/2019 0856 Gross per 24 hour  Intake 240 ml  Output --  Net 240 ml   Last 3 Weights 11/25/2019 11/24/2019 11/10/2019  Weight (lbs) 162 lb 162 lb 168 lb 3.4 oz  Weight (kg) 73.483 kg 73.483 kg 76.3 kg      Telemetry    Sinus rhythm with sinus arrhythmia - Personally Reviewed  ECG    No new since 11/25/19 - Personally Reviewed  Physical Exam   GEN: No acute distress.   Neck: No JVD Cardiac: RRR, no murmurs, rubs, or gallops. CABG site c/d/i and healing well Respiratory: Clear to auscultation bilaterally. GI: Soft, nontender, non-distended  MS: No edema LLE. R LE with bruising, healing SVG harvest site,  minimal edema Neuro:  reports continued visual changes Psych: Normal affect   Labs    High Sensitivity Troponin:   Recent Labs  Lab 11/25/19 0706 11/25/19 0912 11/25/19 1427 11/25/19 1627 11/25/19 2139  TROPONINIHS 24* 139* 257* 275* 254*      Chemistry Recent Labs  Lab 11/24/19 1058 11/24/19 1058 11/25/19 0706 11/25/19 0706 11/26/19 0418 11/26/19 0418 11/27/19 0443 11/28/19 0426 11/29/19 0412  NA 142  --  141   < > 142   < > 140 140 141  K 5.1   < > 4.5   < > 4.2   < > 4.0 4.1 4.3  CL 103   < > 104   < > 106   < > 106 108 107  CO2 22   < > 25   < > 22   < > 22 22 23   GLUCOSE 114*  --  103*   < > 85   < > 81 99 97  BUN 17  --  12   < > 12   < > 9 13 13   CREATININE 1.24*   < > 1.15*   < > 1.20*   < > 1.03* 1.10* 1.10*  CALCIUM 9.5   < > 9.4   < >  8.8*   < > 8.8* 8.7* 9.2  PROT 7.5  --  7.5  --  7.2  --   --   --   --   ALBUMIN 4.3  --  3.8  --  3.6  --   --   --   --   AST 27  --  27  --  21  --   --   --   --   ALT 14  --  15  --  15  --   --   --   --   ALKPHOS 109  --  76  --  76  --   --   --   --   BILITOT 0.5  --  0.8  --  1.0  --   --   --   --   GFRNONAA 41*   < > 45*   < > 43*   < > 52* 48* 48*  GFRAA 48*   < > 52*   < > 50*   < > 60* 55* 55*  ANIONGAP  --   --  12   < > 14   < > 12 10 11    < > = values in this interval not displayed.     Hematology Recent Labs  Lab 11/27/19 0443 11/28/19 0426 11/29/19 0412  WBC 16.9* 16.9* 18.7*  RBC 3.51* 3.63* 3.72*  HGB 11.3* 11.8* 12.1  HCT 35.7* 37.1 38.5  MCV 101.7* 102.2* 103.5*  MCH 32.2 32.5 32.5  MCHC 31.7 31.8 31.4  RDW 21.6* 21.6* 21.5*  PLT 151 154 177    BNPNo results for input(s): BNP, PROBNP in the last 168 hours.   DDimer No results for input(s): DDIMER in the last 168 hours.   Radiology    DG Swallowing Func-Speech Pathology  Result Date: 11/29/2019 Objective Swallowing Evaluation: Type of Study: MBS-Modified Barium Swallow Study  Patient Details Name: DYONNA SLEETH MRN: [id number]  Date of Birth: [birth year] Today's Date: 11/29/2019 Time: SLP Start Time (ACUTE ONLY): 0910 -SLP Stop Time (ACUTE ONLY): 0930 SLP Time Calculation (min) (ACUTE ONLY): 20 min Past Medical History: Past Medical History: Diagnosis Date  Anxiety   Arthritis   Asthma   Bilateral edema of lower extremity   Complication of anesthesia   difficulty waking  Congestive heart failure (CHF) (HCC)   COPD (chronic obstructive pulmonary disease) (HCC)   Coronary artery disease   Dyspnea on exertion   GERD (gastroesophageal reflux disease)   History of melanoma excision   left greast toe 2015  History of squamous cell carcinoma excision   face--  multiple excisions  History of subdural hematoma   2008  Hypertension   OSA (obstructive sleep apnea)   intolerant  Thrombocytosis ([place])   takes hydoxyurea--  MONITORED BY DR Hinton Rao ([place])  VIN III (vulvar intraepithelial neoplasia III)   Wears dentures   UPPER AND LOWER PARTIAL  Wears glasses  Past Surgical History: Past Surgical History: Procedure Laterality Date  ABDOMINAL HYSTERECTOMY  age 56  CARDIAC CATHETERIZATION  03/172021  CORONARY ARTERY BYPASS GRAFT N/A 11/04/2019  Procedure: CORONARY ARTERY BYPASS GRAFTING (CABG) x 4, with ENDOSCOPIC HARVESTING OF RIGHT GREATER SAPHENOUS VEIN.;  Surgeon: Gaye Pollack, MD;  Location: [facility] OR;  Service: Open Heart Surgery;  Laterality: N/A;  INTRAVASCULAR ULTRASOUND/IVUS N/A 11/03/2019  Procedure: Intravascular Ultrasound/IVUS;  Surgeon: Nelva Bush, MD;  Location: [place] CV LAB;  Service: Cardiovascular;  Laterality: N/A;  KNEE ARTHROSCOPY  Left 2004  LEFT HEART CATH AND CORONARY ANGIOGRAPHY N/A 11/03/2019  Procedure: LEFT HEART CATH AND CORONARY ANGIOGRAPHY;  Surgeon: Nelva Bush, MD;  Location: [place] CV LAB;  Service: Cardiovascular;  Laterality: N/A;  MELANOMA EXCISION  2015  left great toe  REPAIR PERONEAL TENDONS ANKLE  2004  SUBDURAL HEMATOMA EVACUATION VIA CRANIOTOMY  2008      week later  post-op  [place] [facility]  TEE WITHOUT CARDIOVERSION N/A 11/04/2019  Procedure: TRANSESOPHAGEAL ECHOCARDIOGRAM (TEE);  Surgeon: Gaye Pollack, MD;  Location: [place];  Service: Open Heart Surgery;  Laterality: N/A;  VULVECTOMY N/A 10/24/2015  Procedure: WIDE LOCAL EXCISION OF THE VULVA ;  Surgeon: Everitt Amber, MD;  Location: [place];  Service: Gynecology;  Laterality: N/A; HPI: 79 Y/O F with PMX of VIN III, HTN, Thrombocytosis, HTN, OSA, hx of subdural hematoma, GERD, COPD, CAD, CHF presented with hx of LL weakness while she tried to go to the bathroom this morning. She denies any fall or LOC. She endorsed associated headache, no slurring of speech, or drooping of her face. She endorsed right LL swelling and pain following CABG harvesting. She is on A/B for cellulitis of her right LL. She denies chest pain, no SOB or cough. Recent hx of left vision changes for which her ophthalmologist recently evaluated her.  No data recorded Assessment / Plan / Recommendation CHL IP CLINICAL IMPRESSIONS 11/29/2019 Clinical Impression Pt demonstrates adequate laryngeal closure with slightly incomplete vestibular closure leading to very trace silent frank penetration events during the swallow. In most cases, penetrate is ejected with subsequent swallow, and despite lack of cough or throat clear response no barium passed into the subglottis. A cue for a light throat clear consistently ejected penetrate and a deep chin tuck with a straw prevented pentration (head turn ineffective). However, pt likely to remain safe from significant aspiration even if no interventions are implemented. Deficts may even be more baseline and WNL for age rather than acute. Esophageal sweep unrevealing. Recommend pt continue a regular diet and thin liquids with f/u education for a throat clear strategy or a chin tuck strategy if coughing continues to occur during meals.  SLP Visit Diagnosis Dysphagia, unspecified (R13.10)  Attention and concentration deficit following -- Frontal lobe and executive function deficit following -- Impact on safety and function --   CHL IP TREATMENT RECOMMENDATION 11/29/2019 Treatment Recommendations Therapy as outlined in treatment plan below   Prognosis 11/29/2019 Prognosis for Safe Diet Advancement Good Barriers to Reach Goals -- Barriers/Prognosis Comment -- CHL IP DIET RECOMMENDATION 11/29/2019 SLP Diet Recommendations Regular solids;Thin liquid Liquid Administration via Cup;Straw Medication Administration Whole meds with liquid Compensations Slow rate;Small sips/bites;Chin tuck;Clear throat intermittently Postural Changes --   CHL IP OTHER RECOMMENDATIONS 11/29/2019 Recommended Consults -- Oral Care Recommendations Oral care BID Other Recommendations --   CHL IP FOLLOW UP RECOMMENDATIONS 11/29/2019 Follow up Recommendations Inpatient Rehab   CHL IP FREQUENCY AND DURATION 11/29/2019 Speech Therapy Frequency (ACUTE ONLY) min 2x/week Treatment Duration 1 week      CHL IP ORAL PHASE 11/29/2019 Oral Phase WFL Oral - Pudding Teaspoon -- Oral - Pudding Cup -- Oral - Honey Teaspoon -- Oral - Honey Cup -- Oral - Nectar Teaspoon -- Oral - Nectar Cup -- Oral - Nectar Straw -- Oral - Thin Teaspoon -- Oral - Thin Cup -- Oral - Thin Straw -- Oral - Puree -- Oral - Mech Soft -- Oral - Regular -- Oral - Multi-Consistency -- Oral - Pill --  Oral Phase - Comment --  CHL IP PHARYNGEAL PHASE 11/29/2019 Pharyngeal Phase Impaired Pharyngeal- Pudding Teaspoon -- Pharyngeal -- Pharyngeal- Pudding Cup -- Pharyngeal -- Pharyngeal- Honey Teaspoon -- Pharyngeal -- Pharyngeal- Honey Cup -- Pharyngeal -- Pharyngeal- Nectar Teaspoon -- Pharyngeal -- Pharyngeal- Nectar Cup -- Pharyngeal -- Pharyngeal- Nectar Straw -- Pharyngeal -- Pharyngeal- Thin Teaspoon -- Pharyngeal -- Pharyngeal- Thin Cup Penetration/Aspiration during swallow;Reduced epiglottic inversion;Compensatory strategies attempted (with notebox) Pharyngeal Material enters  airway, CONTACTS cords and not ejected out;Material enters airway, remains ABOVE vocal cords and not ejected out;Material does not enter airway;Material enters airway, CONTACTS cords and then ejected out Pharyngeal- Thin Straw Penetration/Aspiration during swallow;Compensatory strategies attempted (with notebox) Pharyngeal Material enters airway, CONTACTS cords and then ejected out;Material enters airway, CONTACTS cords and not ejected out;Material enters airway, remains ABOVE vocal cords and not ejected out;Material does not enter airway;Material enters airway, remains ABOVE vocal cords then ejected out Pharyngeal- Puree -- Pharyngeal -- Pharyngeal- Mechanical Soft -- Pharyngeal -- Pharyngeal- Regular -- Pharyngeal -- Pharyngeal- Multi-consistency -- Pharyngeal -- Pharyngeal- Pill -- Pharyngeal -- Pharyngeal Comment --  CHL IP CERVICAL ESOPHAGEAL PHASE 11/29/2019 Cervical Esophageal Phase WFL Pudding Teaspoon -- Pudding Cup -- Honey Teaspoon -- Honey Cup -- Nectar Teaspoon -- Nectar Cup -- Nectar Straw -- Thin Teaspoon -- Thin Cup -- Thin Straw -- Puree -- Mechanical Soft -- Regular -- Multi-consistency -- Pill -- Cervical Esophageal Comment -- Herbie Baltimore, MA CCC-SLP Acute Rehabilitation Services Pager [phone] Office [phone] Lynann Beaver 11/29/2019, 10:26 AM               Cardiac Studies   Echo 11/25/19 1. Left ventricular ejection fraction, by estimation, is 65 to 70%. The  left ventricle has normal function. The left ventricle has no regional  wall motion abnormalities. There is mild left ventricular hypertrophy.  Left ventricular diastolic parameters  are consistent with Grade I diastolic dysfunction (impaired relaxation).  2. Right ventricular systolic function is normal. The right ventricular  size is normal.  3. The mitral valve is normal in structure. No evidence of mitral valve  regurgitation. No evidence of mitral stenosis.  4. The aortic valve is tricuspid. Aortic  valve regurgitation is not  visualized. Mild aortic valve sclerosis is present, with no evidence of  aortic valve stenosis.  5. The inferior vena cava is normal in size with greater than 50%  respiratory variability, suggesting right atrial pressure of 3 mmHg.   Patient Profile     79 y.o. female with PMH hypertension, hyperlipidemia, OSA, CAD s/p CABG 11/03/19 (Dr. Cyndia Bent) complicated by visual changes, now found to have CVA based on MRI. Also with cellulitis of her SVG site.  Assessment & Plan    CVA: visual changes, leg/arm weakness reported -MRI noted scattered bilateral infarcts, concern for embolic cause, likely subacute on chronic -no atrial shunt noted on CABG TEE -echo with EF 65-70% -no afib seen postop or during this hospitalization -EP consulted, pending loop recorder insertion -carotid dopplers unremarkable (1-39% bilaterally) -on aspirin 81 mg daily, atorvastatin 80 mg daily, clopidogrel 75 mg daily  CAD s/p CABG 11/03/19: -elevated troponin this admission, but no symptoms. -continue aspirin, clopidogrel, statin as above -on metoprolol tartrate 25 mg BID  Hypertension: well controlled this admission -on metoprolol tartrate 25 mg BID  Hyperlipidemia: -on atorvastatin 80 mg daily  [facility] will sign off pending transfer to rehab facility.   Medication Recommendations:  Continue aspirin, clopidogrel, statin, metoprolol as ordered Other recommendations (labs, testing, etc):  Loop per EP Follow up as an outpatient:  I will ask for her appt with Dr. Geraldo Pitter (which was scheduled during her admission) to be rescheduled for several weeks from now.  For questions or updates, please contact [place] Please consult [email or web address] for contact info under    Signed, Buford Dresser, MD  11/29/2019, 12:37 PM

## 2019-11-29 NOTE — Telephone Encounter (Signed)
Reference # [id number]   Yolanda Flowers was calling as a curtusey to let Dr. Geraldo Pitter know that the leads for this patient's monitor have been disconnected after being worn for only one day.  The patient was admitted to the hospital and the disconnect may have occurred once the patient was admitted. [employer]hythm just wanted to make Dr. Geraldo Pitter aware
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
## 2019-11-29 NOTE — Progress Notes (Addendum)
Discussed with Rehab, no bed available for this patient today.   Will check back with them for her tomorrow and timing of loop.  PLEASE CALL WHEN DISCHARGE TIMING IS KNOWN  Tommye Standard, PA-C

## 2019-11-29 NOTE — [place] FL2 (Signed)
[place] MEDICAID FL2 LEVEL OF CARE SCREENING TOOL     IDENTIFICATION  Patient Name: Yolanda Flowers Birthdate: July 21, 1941 Sex: female Admission Date (Current Location): 11/25/2019  [facility] and [place] Number:  Herbalist and Address:  [facility] [place], [place] [address], [place], [place] [address]      Provider Number: [id number]  Attending Physician Name and Address:  Kinnie Feil, MD  Relative Name and Phone Number:  Yolanda, Bresee Flowers    Current Level of Care: Hospital Recommended Level of Care: [place] Prior Approval Number:    Date Approved/Denied:   PASRR Number: [id number]  Discharge Plan: SNF    Current Diagnoses: Patient Active Problem List   Diagnosis Date Noted  . Cellulitis of right leg   . Abdominal pain   . Stroke due to embolism ([place]) 11/25/2019  . S/P CABG x 4 11/04/2019  . CAD (coronary artery disease) 10/25/2019  . Accelerating angina ([place]) 08/26/2019  . Vitamin D deficiency 08/24/2019  . Combined hyperlipidemia 08/24/2019  . Body mass index 29.0-29.9, adult 08/24/2019  . Vitamin B 12 deficiency 08/24/2019  . Malignant melanoma of great toe ([place]) 08/24/2019  . Pedal edema 08/24/2019  . Skin cancer 08/24/2019  . Squamous cell carcinoma, scalp/neck 08/24/2019  . Ischial bursitis, right 08/24/2019  . Swelling of limb 08/24/2019  . Renal insufficiency 08/24/2019  . Atherosclerosis of arteries 08/24/2019  . Dyspnea 02/09/2019  . Solitary pulmonary nodule on lung CT 02/09/2019  . Gastrointestinal bleeding 06/23/2018  . VIN III (vulvar intraepithelial neoplasia III) 10/09/2015  . Melanoma in situ ([place]) 06/29/2014  . Essential thrombocytosis ([place]) 06/22/2014  . COPD (chronic obstructive pulmonary disease) ([place]) 06/21/2014  . HTN (hypertension) 06/21/2014  . Allergic rhinitis 06/21/2014  . GERD (gastroesophageal reflux disease) 06/21/2014    Orientation RESPIRATION BLADDER Height &  Weight     Self, Time, Situation, Place  Normal External catheter Weight: 162 lb (73.5 kg) Height:  5\' 6"  (167.6 cm)  BEHAVIORAL SYMPTOMS/MOOD NEUROLOGICAL BOWEL NUTRITION STATUS      Continent Diet(See discharge summary)  AMBULATORY STATUS COMMUNICATION OF NEEDS Skin   Limited Assist Verbally Surgical wounds, Other (Comment)(Right Leg surgical incisision, cellulitis, ecchymosis.)                       Personal Care Assistance Level of Assistance  Bathing, Feeding, Dressing Bathing Assistance: Maximum assistance Feeding assistance: Independent Dressing Assistance: Maximum assistance     Functional Limitations Info  Sight, Hearing, Speech Sight Info: Impaired(Left impaired vision) Hearing Info: Adequate Speech Info: Adequate    SPECIAL CARE FACTORS FREQUENCY  PT (By licensed PT), OT (By licensed OT), Speech therapy     PT Frequency: 5x week OT Frequency: 5x week     Speech Therapy Frequency: 5x week      Contractures Contractures Info: Not present    Additional Factors Info  Code Status, Allergies Code Status Info: DNR Allergies Info: Cephalexin, Ciprofloxacin, Pennecillins.           Current Medications (11/29/2019):  This is the current hospital active medication list Current Facility-Administered Medications  Medication Dose Route Frequency Provider Last Rate Last Admin  . acetaminophen (TYLENOL) tablet 650 mg  650 mg Oral Q6H PRN Autry-Lott, Simone, DO       Or  . acetaminophen (TYLENOL) suppository 650 mg  650 mg Rectal Q6H PRN Autry-Lott, Simone, DO      . alum & mag hydroxide-simeth (  MAALOX/MYLANTA) 200-200-20 MG/5ML suspension 30 mL  30 mL Oral Q6H PRN Andrena Mews T, MD      . aspirin EC tablet 81 mg  81 mg Oral Daily Andrena Mews T, MD   81 mg at 11/29/19 1038  . atorvastatin (LIPITOR) tablet 80 mg  80 mg Oral q1800 Autry-Lott, Simone, DO   80 mg at 11/28/19 1810  . clopidogrel (PLAVIX) tablet 75 mg  75 mg Oral Daily Autry-Lott, Simone, DO    75 mg at 11/29/19 1038  . enoxaparin (LOVENOX) injection 40 mg  40 mg Subcutaneous Q24H Mullis, Kiersten P, DO   40 mg at 11/28/19 1810  . ferrous [id number] C-folic acid (TRINSICON / FOLTRIN) capsule 1 capsule  1 capsule Oral BID PC Autry-Lott, Simone, DO   1 capsule at 11/29/19 0824  . hydroxyurea (HYDREA) capsule 500 mg  500 mg Oral Daily Autry-Lott, Simone, DO   500 mg at 11/29/19 1038  . metoprolol tartrate (LOPRESSOR) tablet 25 mg  25 mg Oral BID Higinio Plan, Samantha N, DO   25 mg at 11/29/19 1038  . pantoprazole (PROTONIX) EC tablet 40 mg  40 mg Oral Daily Autry-Lott, Simone, DO   40 mg at 11/29/19 1038  . polyethylene glycol (MIRALAX / GLYCOLAX) packet 17 g  17 g Oral Daily PRN Autry-Lott, Naaman Plummer, DO         Discharge Medications: Please see discharge summary for a list of discharge medications.  Relevant Imaging Results:  Relevant Lab Results:   Additional Information Yolanda Flowers, [place] Work

## 2019-11-29 NOTE — Progress Notes (Signed)
Modified Barium Swallow Progress Note  Patient Details  Name: Yolanda Flowers MRN: [id number] Date of Birth: 10/02/1940  Today's Date: 11/29/2019  Modified Barium Swallow completed.  Full report located under Chart Review in the Imaging Section.  Brief recommendations include the following:  Clinical Impression  Pt demonstrates adequate laryngeal closure with slightly incomplete vestibular closure leading to very trace silent frank penetration events during the swallow. In most cases, penetrate is ejected with subsequent swallow, and despite lack of cough or throat clear response no barium passed into the subglottis. A cue for a light throat clear consistently ejected penetrate and a deep chin tuck with a straw prevented pentration (head turn ineffective). However, pt likely to remain safe from significant aspiration even if no interventions are implemented. Deficts may even be more baseline and WNL for age rather than acute. Esophageal sweep unrevealing. Recommend pt continue a regular diet and thin liquids with f/u education for a throat clear strategy or a chin tuck strategy if coughing continues to occur during meals.    Swallow Evaluation Recommendations       SLP Diet Recommendations: Regular solids;Thin liquid   Liquid Administration via: Cup;Straw   Medication Administration: Whole meds with liquid   Supervision: Patient able to self feed   Compensations: Slow rate;Small sips/bites;Chin tuck;Clear throat intermittently       Oral Care Recommendations: Oral care BID       Herbie Baltimore, MA CCC-SLP  Acute Rehabilitation Services Pager [phone] Office [phone]  Lynann Beaver 11/29/2019,10:02 AM

## 2019-11-29 NOTE — Progress Notes (Addendum)
Inpatient Rehab Admissions Coordinator Note:   Met with patient at bedside. Discussed possible CIR admission with her. She states interest in program. Will begin the process of obtaining insurance authorization. Letter stating that Pt. Has been recommended for CIR was given to Pt.. Cost of CIR if insurance gives approval was discussed with Pt. And her daughter in law, Jazsmine Macari .  Admission is pending insurance authorization and bed availability.   Clemens Catholic, [employer], [place] Admissions Coordinator  [phone] ([place]) [phone] (office)

## 2019-11-29 NOTE — PMR Pre-admission (Signed)
PMR Admission Coordinator Pre-Admission Assessment  Patient: Yolanda Flowers is an 79 y.o., female MRN: [id number] DOB: [birth year] Height: 5' 6" (167.6 cm) Weight: 73.5 kg              Insurance Information HMO:    PPO:  Yes     PCP: [name], MD     IPA:     80/20:      OTHER:  PRIMARY: [employer]       Policy#: [id number]      Subscriber: patient CM Name: [name]       Phone#: [phone]     Fax#: [phone] Pre-Cert#: [id number] for 7 days      Employer: n/a Benefits:  Phone #:      Name: [name]: 08/20/2019     Deduct: $0      Out of Pocket Max: $3,400 ($0 met)       CIR: $295 Co-pay days 1-6, $0/day 7-90      SNF: $0 co-pay days 0-20, $178/day days 21-100, limited to 100 days per benefit period Outpatient: $15/visit, limited by medical necessity     Co-Pay: $15/visit Home Health: 100% coverage     Co-Pay: 0%+ DME: 80% coverage     Co-Pay: 20% Providers: Preferred network  SECONDARY:  Medicaid Application Date:       Case Manager:  Disability Application Date:       Case Worker:   The "Data Collection Information Summary" for patients in Inpatient Rehabilitation Facilities with attached "Privacy Act [place] Records" was provided and verbally reviewed with: Patient and Family  Emergency Contact Information Contact Information    Name Relation Home Work Mobile   Bodmer,[name] Daughter [phone]       Current Medical History  Patient Admitting Diagnosis: Stroke  History of Present Illness:   Yolanda Flowers is a 79 y.o. female with history of HTN, thrombocytosis, SDH, COPD, CAD s/p CABG 10/29/19 who was admitted on 11/25/19 with BLE weakness and LUE weakness. CTA head/neck was negative for acute abnormality or LVO and showed moderate to marked chronic microvascular changes. MRI brain done revealing scattered bilateral acute cerebral and cerebellar infarct and small subacute on chronic small left frontal SDH.  Stroke felt to be embolic loop recorder to  be placed on 4/13,  as DAPT X 3 weeks.  2D echo showed EF 65-70% with no wall abnormality and mild aortic sclerosis. BLE dopplers were negative for DVT. Pt. With LE wound cellulitis at SVG site, now resolved following course of doxycycline.   Complete NIHSS TOTAL: 1 Glasgow Coma Scale Score: 15  Past Medical History  Past Medical History:  Diagnosis Date  . Anxiety   . Arthritis   . Asthma   . Bilateral edema of lower extremity   . Complication of anesthesia    difficulty waking  . Congestive heart failure (CHF) ([employer])   . COPD (chronic obstructive pulmonary disease) ([place])   . Coronary artery disease   . Dyspnea on exertion   . GERD (gastroesophageal reflux disease)   . History of melanoma excision    left greast toe 2015  . History of squamous cell carcinoma excision    face--  multiple excisions  . History of subdural hematoma    2008  . Hypertension   . OSA (obstructive sleep apnea)    intolerant  . Thrombocytosis ([place])    takes hydoxyurea--  MONITORED BY DR Hinton Rao [facility])  . VIN III (vulvar intraepithelial neoplasia  III)   . Wears dentures    UPPER AND LOWER PARTIAL  . Wears glasses     Family History  family history includes Arthritis in her brother, father, mother, and sister; Asthma in her father; Clotting disorder in her mother; Emphysema in her father; Heart disease in her brother, father, mother, and sister; Hypertension in her brother, father, mother, and sister; Kidney failure in her father; Stroke in her father.  Prior Rehab/Hospitalizations:  Has the patient had prior rehab or hospitalizations prior to admission? Yes  Has the patient had major surgery during 100 days prior to admission? Yes  Current Medications   Current Facility-Administered Medications:  .  acetaminophen (TYLENOL) tablet 650 mg, 650 mg, Oral, Q6H PRN **OR** acetaminophen (TYLENOL) suppository 650 mg, 650 mg, Rectal, Q6H PRN, Autry-Lott, Simone, DO .  alum & mag  hydroxide-simeth (MAALOX/MYLANTA) 200-200-20 MG/5ML suspension 30 mL, 30 mL, Oral, Q6H PRN, [place], Kehinde T, MD .  aspirin EC tablet 81 mg, 81 mg, Oral, Daily, [place], Kehinde T, MD, 81 mg at 11/30/19 1106 .  atorvastatin (LIPITOR) tablet 80 mg, 80 mg, Oral, q1800, Autry-Lott, Simone, DO, 80 mg at 11/29/19 1729 .  [COMPLETED] clopidogrel (PLAVIX) tablet 300 mg, 300 mg, Oral, Once, 300 mg at 11/25/19 1712 **FOLLOWED BY** clopidogrel (PLAVIX) tablet 75 mg, 75 mg, Oral, Daily, Autry-Lott, Simone, DO, 75 mg at 11/30/19 1106 .  enoxaparin (LOVENOX) injection 40 mg, 40 mg, Subcutaneous, Q24H, Mullis, Kiersten P, DO, 40 mg at 11/29/19 1354 .  ferrous [id number] C-folic acid (TRINSICON / FOLTRIN) capsule 1 capsule, 1 capsule, Oral, BID PC, Autry-Lott, Simone, DO, 1 capsule at 11/30/19 0825 .  hydroxyurea (HYDREA) capsule 500 mg, 500 mg, Oral, Daily, Autry-Lott, Simone, DO, 500 mg at 11/30/19 1106 .  metoprolol tartrate (LOPRESSOR) tablet 25 mg, 25 mg, Oral, BID, Beard, Samantha N, DO, 25 mg at 11/30/19 1106 .  pantoprazole (PROTONIX) EC tablet 40 mg, 40 mg, Oral, Daily, Autry-Lott, Simone, DO, 40 mg at 11/30/19 1106 .  polyethylene glycol (MIRALAX / GLYCOLAX) packet 17 g, 17 g, Oral, Daily PRN, Autry-Lott, Simone, DO  Patients Current Diet:  Diet Order            Diet Heart Healthy Regular Texture Room service appropriate? Yes; Fluid consistency: Thin  Diet effective now              Precautions / Restrictions Precautions Precautions: Sternal Precaution Comments: Demonstrated appropriate sternal precautions Restrictions Weight Bearing Restrictions: No Other Position/Activity Restrictions: Sternal Precautions   Has the patient had 2 or more falls or a fall with injury in the past year?No  Prior Activity Level Household: Pt. was independent prior to CABG in March 2021. Family has been helping with household tasks since surgery.  Limited Community (1-2x/wk): Pt. reports leaving home  1-2x a week   Prior Functional Level Prior Function Level of Independence: Independent with assistive device(s) Comments: used a cane. Ptior to CABG she was independent and mowing the lawn on her own   Self Care: Did the patient need help bathing, dressing, using the toilet or eating?  Independent  Indoor Mobility: Did the patient need assistance with walking from room to room (with or without device)? Independent  Stairs: Did the patient need assistance with internal or external stairs (with or without device)? Independent  Functional Cognition: Did the patient need help planning regular tasks such as shopping or remembering to take medications? Independent  Home Assistive Devices / Equipment Home Assistive Devices/Equipment: Eyeglasses Home Equipment: Kasandra Knudsen -  single point  Prior Device Use: Indicate devices/aids used by the patient prior to current illness, exacerbation or injury? cane  Current Functional Level Cognition  Arousal/Alertness: Awake/alert Overall Cognitive Status: Within Functional Limits for tasks assessed Orientation Level: Oriented X4 General Comments: AOX3  Memory: Appears intact Awareness: Appears intact Problem Solving: Appears intact Safety/Judgment: Appears intact    Extremity Assessment (includes Sensation/Coordination)  Upper Extremity Assessment: Generalized weakness, RUE deficits/detail, LUE deficits/detail RUE Deficits / Details: strength 3-/5 MM grade, 3/5 hand squeeze LUE Deficits / Details: strength 3+/5 MM grade, 4/5 hand squeeze  Lower Extremity Assessment: Generalized weakness RLE Deficits / Details: R hip flexion and abdcution weaker compared to left     ADLs  Overall ADL's : Needs assistance/impaired Eating/Feeding: Set up, Sitting Grooming: Set up, Brushing hair, Min guard, Oral care, Wash/dry face, Wash/dry hands, Standing, Sitting Grooming Details (indicate cue type and reason): Able to stand at sink to complete ADLs, one seated rest  break in between (able to stand for 3-5 minutes) Upper Body Bathing: Set up, Sitting Lower Body Bathing: Moderate assistance, +2 for physical assistance, +2 for safety/equipment, Sitting/lateral leans, Sit to/from stand Upper Body Dressing : Minimal assistance, Sitting Lower Body Dressing: Moderate assistance, +2 for physical assistance, +2 for safety/equipment, Sitting/lateral leans, Sit to/from stand Toilet Transfer: +2 for physical assistance, +2 for safety/equipment, Stand-pivot, RW, BSC, Minimal assistance Toilet Transfer Details (indicate cue type and reason): Simulated with recliner Toileting- Clothing Manipulation and Hygiene: Moderate assistance, +2 for physical assistance, +2 for safety/equipment, Cueing for safety, Cueing for sequencing, Sitting/lateral lean, Sit to/from stand Functional mobility during ADLs: Moderate assistance, +2 for physical assistance, +2 for safety/equipment, Cueing for safety, Rolling walker, Minimal assistance General ADL Comments: Pt with increased gains in functional mobility and and activity tolerance, hower continues to fatigue quickly and require seated rest breaks    Mobility  Overal bed mobility: Needs Assistance Bed Mobility: Supine to Sit Supine to sit: Min guard Sit to supine: Min guard General bed mobility comments: Increased time and use of rail.    Transfers  Overall transfer level: Needs assistance Equipment used: Rolling walker (2 wheeled) Transfers: Sit to/from Stand Sit to Stand: Min assist Stand pivot transfers: Mod assist, +2 physical assistance, +2 safety/equipment General transfer comment: assist to power up and to steady; cues for safe hand placement    Ambulation / Gait / Stairs / Wheelchair Mobility  Ambulation/Gait Ambulation/Gait assistance: Min assist, +2 safety/equipment, +2 physical assistance Gait Distance (Feet): (~50 ft total (RW and HHA +2)) Assistive device: Rolling walker (2 wheeled), 2 person hand held assist Gait  Pattern/deviations: Step-to pattern, Step-through pattern, Decreased step length - right, Decreased step length - left, Decreased dorsiflexion - right, Trunk flexed, Steppage General Gait Details: steppage gait R LE; cues for sequencing, upright posture, and use of AD; assistance required for balance; step through pattern with HHA and step to with RW Gait velocity: decreased    Posture / Balance Dynamic Sitting Balance Sitting balance - Comments: SBA while sitting  Balance Overall balance assessment: Needs assistance Sitting-balance support: Bilateral upper extremity supported Sitting balance-Leahy Scale: Fair Sitting balance - Comments: SBA while sitting  Standing balance support: Bilateral upper extremity supported Standing balance-Leahy Scale: Poor Standing balance comment: Reliant on external support bilaterally    Special needs/care consideration BiPAP/CPAP No CPM: No Continuous Drip IV: No Dialysis: No      Life Vest no Oxygen: Room air Special Bed: No Trach Size: none Wound Vac (area): No  Skin: Cellulitis                        Location: RLE Bowel mgmt:No  Bladder mgmt:No Diabetic mgmt: No Behavioral consideration None Chemo/radiation None Visitors to be [name] and Tim Loop recorder placed 4/13    Previous Home Environment  Living Arrangements: Alone  Lives With: Spouse Available Help at Discharge: Family Type of Home: House Home Layout: One level Home Access: Other (comment)(single step to enter) Bathroom Shower/Tub: Tub only, Multimedia programmer: Handicapped height [place]: No Additional Comments: Patients daughter in law helps at home   Discharge Living Setting Plans for Discharge Living Setting: Patient's home Type of Home at Discharge: House Discharge Home Layout: One level Discharge Home Access: Other (comment)(single step to enter) Discharge Bathroom Shower/Tub: Tub only, Walk-in shower Discharge Bathroom Toilet: Handicapped  height Does the patient have any problems obtaining your medications?: No  Social/Family/Support Systems Patient Roles: Other (Comment)(Daughter in Law and/or Niece) Pt.'s son states that family can only provide short-term supervision and assistance following discharge, likely for less than 1 month.  Contact Information: Daughter in [place], [place]  Anticipated Caregiver: Konstantina Nachreiner Anticipated Caregiver's Contact Information: [phone] Ability/Limitations of Caregiver: Has back problems Caregiver Availability: 24/7(short term) Discharge Plan Discussed with Primary Caregiver: Yes Is Caregiver In Agreement with Plan?: Yes  Goals/Additional Needs Patient/Family Goal for Rehab: PT/OT min assist; SLP mod I (family wish for patient to be mod I with OT/PT/ST Expected length of stay: 17-20 days Equipment Needs: 4W rolling walker with 5" wheels Pt/Family Agrees to Admission and willing to participate: Yes Program Orientation Provided & Reviewed with Pt/Caregiver Including Roles  & Responsibilities: Yes  Decrease burden of Care through IP rehab admission: Specialzed equipment needs, Decrease number of caregivers and Patient/family education  Possible need for SNF placement upon discharge: Not anticipated  Patient Condition: This patient's condition remains as documented in the consult dated 11/27/2019, in which the Rehabilitation Physician determined and documented that the patient's condition is appropriate for intensive rehabilitative care in an inpatient rehabilitation facility. Will admit to inpatient rehab today  Preadmission Screen Completed By: Clemens Catholic , with brief updates by  Cleatrice Burke, RN, 11/30/2019 2:25 PM ______________________________________________________________________   Discussed status with Dr. Dagoberto Ligas on 11/30/2019 at  1430 and received approval for admission today.  Admission Coordinator: Clemens Catholic with updates by  Cleatrice Burke, time 1430  Date 11/30/2019

## 2019-11-29 NOTE — Progress Notes (Signed)
Offered to assist patient to chair, however, stated she did not sleep well and wanted to rest.

## 2019-11-30 ENCOUNTER — Other Ambulatory Visit: Payer: Self-pay

## 2019-11-30 ENCOUNTER — Inpatient Hospital Stay (HOSPITAL_COMMUNITY)
Admission: RE | Admit: 2019-11-30 | Discharge: 2019-12-10 | DRG: 057 | Disposition: A | Discharge status: Home Health | Payer: PPO | Source: Intra-hospital | Attending: Physical Medicine & Rehabilitation | Admitting: Physical Medicine & Rehabilitation

## 2019-11-30 ENCOUNTER — Encounter (HOSPITAL_COMMUNITY): Payer: Self-pay | Admitting: Physical Medicine & Rehabilitation

## 2019-11-30 ENCOUNTER — Encounter (HOSPITAL_COMMUNITY)
Admission: EM | Disposition: A | Discharge status: Rehabilitation Facility | Payer: Self-pay | Source: Home / Self Care | Attending: Family Medicine

## 2019-11-30 DIAGNOSIS — T82837A Hemorrhage of cardiac prosthetic devices, implants and grafts, initial encounter: Secondary | ICD-10-CM | POA: Diagnosis not present

## 2019-11-30 DIAGNOSIS — D62 Acute posthemorrhagic anemia: Secondary | ICD-10-CM

## 2019-11-30 DIAGNOSIS — I69392 Facial weakness following cerebral infarction: Secondary | ICD-10-CM | POA: Diagnosis not present

## 2019-11-30 DIAGNOSIS — Z951 Presence of aortocoronary bypass graft: Secondary | ICD-10-CM

## 2019-11-30 DIAGNOSIS — K5909 Other constipation: Secondary | ICD-10-CM | POA: Diagnosis not present

## 2019-11-30 DIAGNOSIS — I110 Hypertensive heart disease with heart failure: Secondary | ICD-10-CM | POA: Diagnosis present

## 2019-11-30 DIAGNOSIS — I2510 Atherosclerotic heart disease of native coronary artery without angina pectoris: Secondary | ICD-10-CM | POA: Diagnosis present

## 2019-11-30 DIAGNOSIS — L03115 Cellulitis of right lower limb: Secondary | ICD-10-CM | POA: Diagnosis not present

## 2019-11-30 DIAGNOSIS — E875 Hyperkalemia: Secondary | ICD-10-CM | POA: Diagnosis present

## 2019-11-30 DIAGNOSIS — Z85820 Personal history of malignant melanoma of skin: Secondary | ICD-10-CM | POA: Diagnosis not present

## 2019-11-30 DIAGNOSIS — T451X5A Adverse effect of antineoplastic and immunosuppressive drugs, initial encounter: Secondary | ICD-10-CM | POA: Diagnosis not present

## 2019-11-30 DIAGNOSIS — D72829 Elevated white blood cell count, unspecified: Secondary | ICD-10-CM

## 2019-11-30 DIAGNOSIS — I69398 Other sequelae of cerebral infarction: Secondary | ICD-10-CM | POA: Diagnosis not present

## 2019-11-30 DIAGNOSIS — H5340 Unspecified visual field defects: Secondary | ICD-10-CM | POA: Diagnosis not present

## 2019-11-30 DIAGNOSIS — Z90710 Acquired absence of both cervix and uterus: Secondary | ICD-10-CM | POA: Diagnosis not present

## 2019-11-30 DIAGNOSIS — I509 Heart failure, unspecified: Secondary | ICD-10-CM | POA: Diagnosis not present

## 2019-11-30 DIAGNOSIS — Z79899 Other long term (current) drug therapy: Secondary | ICD-10-CM

## 2019-11-30 DIAGNOSIS — J449 Chronic obstructive pulmonary disease, unspecified: Secondary | ICD-10-CM | POA: Diagnosis present

## 2019-11-30 DIAGNOSIS — I6319 Cerebral infarction due to embolism of other precerebral artery: Secondary | ICD-10-CM

## 2019-11-30 DIAGNOSIS — I6340 Cerebral infarction due to embolism of unspecified cerebral artery: Secondary | ICD-10-CM

## 2019-11-30 DIAGNOSIS — E785 Hyperlipidemia, unspecified: Secondary | ICD-10-CM | POA: Diagnosis present

## 2019-11-30 DIAGNOSIS — I639 Cerebral infarction, unspecified: Secondary | ICD-10-CM | POA: Diagnosis present

## 2019-11-30 DIAGNOSIS — M549 Dorsalgia, unspecified: Secondary | ICD-10-CM | POA: Diagnosis present

## 2019-11-30 DIAGNOSIS — N179 Acute kidney failure, unspecified: Secondary | ICD-10-CM | POA: Diagnosis not present

## 2019-11-30 DIAGNOSIS — D473 Essential (hemorrhagic) thrombocythemia: Secondary | ICD-10-CM | POA: Diagnosis present

## 2019-11-30 DIAGNOSIS — D72828 Other elevated white blood cell count: Secondary | ICD-10-CM | POA: Diagnosis not present

## 2019-11-30 DIAGNOSIS — Y848 Other medical procedures as the cause of abnormal reaction of the patient, or of later complication, without mention of misadventure at the time of the procedure: Secondary | ICD-10-CM | POA: Diagnosis present

## 2019-11-30 DIAGNOSIS — I6349 Cerebral infarction due to embolism of other cerebral artery: Secondary | ICD-10-CM | POA: Diagnosis not present

## 2019-11-30 DIAGNOSIS — G8929 Other chronic pain: Secondary | ICD-10-CM | POA: Diagnosis present

## 2019-11-30 DIAGNOSIS — K219 Gastro-esophageal reflux disease without esophagitis: Secondary | ICD-10-CM | POA: Diagnosis present

## 2019-11-30 DIAGNOSIS — I10 Essential (primary) hypertension: Secondary | ICD-10-CM | POA: Diagnosis present

## 2019-11-30 HISTORY — DX: Cerebral infarction, unspecified: I63.9

## 2019-11-30 HISTORY — PX: LOOP RECORDER INSERTION: EP1214

## 2019-11-30 LAB — GLUCOSE, CAPILLARY: Glucose-Capillary: 83 mg/dL (ref 70–99)

## 2019-11-30 SURGERY — LOOP RECORDER INSERTION

## 2019-11-30 MED ORDER — PANTOPRAZOLE SODIUM 40 MG PO TBEC
40.0000 mg | DELAYED_RELEASE_TABLET | Freq: Every day | ORAL | Status: DC
  Administered 2019-12-01 – 2019-12-05 (×5): 40 mg via ORAL
  Filled 2019-11-30 (×5): qty 1

## 2019-11-30 MED ORDER — PROSIGHT PO TABS
1.0000 | ORAL_TABLET | Freq: Two times a day (BID) | ORAL | Status: DC
  Administered 2019-11-30 – 2019-12-10 (×20): 1 via ORAL
  Filled 2019-11-30 (×20): qty 1

## 2019-11-30 MED ORDER — CLOPIDOGREL BISULFATE 75 MG PO TABS: 75.0000 mg | ORAL_TABLET | Freq: Every day | ORAL | 0 refills | Status: DC

## 2019-11-30 MED ORDER — CLOPIDOGREL BISULFATE 75 MG PO TABS
75.0000 mg | ORAL_TABLET | Freq: Every day | ORAL | Status: DC
  Administered 2019-12-01 – 2019-12-10 (×10): 75 mg via ORAL
  Filled 2019-11-30 (×10): qty 1

## 2019-11-30 MED ORDER — TRAZODONE HCL 50 MG PO TABS
25.0000 mg | ORAL_TABLET | Freq: Every evening | ORAL | Status: DC | PRN
  Filled 2019-11-30: qty 1

## 2019-11-30 MED ORDER — ATORVASTATIN CALCIUM 80 MG PO TABS: 80.0000 mg | ORAL_TABLET | Freq: Every day | ORAL | 0 refills | Status: DC

## 2019-11-30 MED ORDER — FLEET ENEMA 7-19 GM/118ML RE ENEM: 1.0000 | ENEMA | Freq: Once | RECTAL | Status: DC | PRN

## 2019-11-30 MED ORDER — LIDOCAINE-EPINEPHRINE 1 %-1:100000 IJ SOLN
INTRAMUSCULAR | Status: AC
  Filled 2019-11-30: qty 1

## 2019-11-30 MED ORDER — PROCHLORPERAZINE EDISYLATE 10 MG/2ML IJ SOLN: 5.0000 mg | Freq: Four times a day (QID) | INTRAMUSCULAR | Status: DC | PRN

## 2019-11-30 MED ORDER — VITAMIN D (ERGOCALCIFEROL) 1.25 MG (50000 UNIT) PO CAPS
50000.0000 [IU] | ORAL_CAPSULE | ORAL | Status: DC
  Administered 2019-12-06: 50000 [IU] via ORAL
  Filled 2019-11-30: qty 1

## 2019-11-30 MED ORDER — METOPROLOL TARTRATE 25 MG PO TABS
25.0000 mg | ORAL_TABLET | Freq: Two times a day (BID) | ORAL | Status: DC
  Administered 2019-11-30 – 2019-12-06 (×11): 25 mg via ORAL
  Filled 2019-11-30 (×12): qty 1

## 2019-11-30 MED ORDER — ALUM & MAG HYDROXIDE-SIMETH 200-200-20 MG/5ML PO SUSP
30.0000 mL | ORAL | Status: DC | PRN
  Administered 2019-12-02: 30 mL via ORAL
  Filled 2019-11-30 (×2): qty 30

## 2019-11-30 MED ORDER — ASPIRIN 81 MG PO TBEC: 81.0000 mg | DELAYED_RELEASE_TABLET | Freq: Every day | ORAL | 0 refills | Status: DC

## 2019-11-30 MED ORDER — HYDROCERIN EX CREA
TOPICAL_CREAM | Freq: Two times a day (BID) | CUTANEOUS | Status: DC
  Administered 2019-12-03 – 2019-12-05 (×3): 1 via TOPICAL
  Filled 2019-11-30: qty 113

## 2019-11-30 MED ORDER — OCUVITE-LUTEIN PO CAPS
2.0000 | ORAL_CAPSULE | Freq: Two times a day (BID) | ORAL | Status: DC
  Filled 2019-11-30 (×2): qty 2

## 2019-11-30 MED ORDER — BISACODYL 10 MG RE SUPP: 10.0000 mg | Freq: Every day | RECTAL | Status: DC | PRN

## 2019-11-30 MED ORDER — ASPIRIN EC 81 MG PO TBEC
81.0000 mg | DELAYED_RELEASE_TABLET | Freq: Every day | ORAL | Status: DC
  Administered 2019-12-01 – 2019-12-10 (×10): 81 mg via ORAL
  Filled 2019-11-30 (×10): qty 1

## 2019-11-30 MED ORDER — FLUTICASONE PROPIONATE 50 MCG/ACT NA SUSP
2.0000 | Freq: Every day | NASAL | Status: DC
  Administered 2019-11-30 – 2019-12-09 (×10): 2 via NASAL
  Filled 2019-11-30: qty 16

## 2019-11-30 MED ORDER — ATORVASTATIN CALCIUM 80 MG PO TABS
80.0000 mg | ORAL_TABLET | Freq: Every day | ORAL | Status: DC
  Administered 2019-12-01 – 2019-12-09 (×9): 80 mg via ORAL
  Filled 2019-11-30 (×9): qty 1

## 2019-11-30 MED ORDER — PROCHLORPERAZINE 25 MG RE SUPP: 12.5000 mg | Freq: Four times a day (QID) | RECTAL | Status: DC | PRN

## 2019-11-30 MED ORDER — DIPHENHYDRAMINE HCL 12.5 MG/5ML PO ELIX: 12.5000 mg | ORAL_SOLUTION | Freq: Four times a day (QID) | ORAL | Status: DC | PRN

## 2019-11-30 MED ORDER — GUAIFENESIN-DM 100-10 MG/5ML PO SYRP: 5.0000 mL | ORAL_SOLUTION | Freq: Four times a day (QID) | ORAL | Status: DC | PRN

## 2019-11-30 MED ORDER — LIDOCAINE 5 % EX PTCH
1.0000 | MEDICATED_PATCH | CUTANEOUS | Status: DC
  Administered 2019-12-01 – 2019-12-10 (×10): 1 via TRANSDERMAL
  Filled 2019-11-30 (×10): qty 1

## 2019-11-30 MED ORDER — FE FUMARATE-B12-VIT C-FA-IFC PO CAPS
1.0000 | ORAL_CAPSULE | Freq: Two times a day (BID) | ORAL | Status: DC
  Administered 2019-12-01 – 2019-12-10 (×19): 1 via ORAL
  Filled 2019-11-30 (×19): qty 1

## 2019-11-30 MED ORDER — POLYETHYLENE GLYCOL 3350 17 G PO PACK
17.0000 g | PACK | Freq: Every day | ORAL | Status: DC | PRN
  Administered 2019-12-01: 17 g via ORAL
  Filled 2019-11-30 (×2): qty 1

## 2019-11-30 MED ORDER — ENOXAPARIN SODIUM 40 MG/0.4ML ~~LOC~~ SOLN
40.0000 mg | SUBCUTANEOUS | Status: DC
  Administered 2019-12-02 – 2019-12-08 (×7): 40 mg via SUBCUTANEOUS
  Filled 2019-11-30 (×9): qty 0.4

## 2019-11-30 MED ORDER — LIDOCAINE HCL (PF) 1 % IJ SOLN
INTRAMUSCULAR | Status: DC | PRN
  Administered 2019-11-30: 5 mL

## 2019-11-30 MED ORDER — METOPROLOL TARTRATE 25 MG PO TABS: 25.0000 mg | ORAL_TABLET | Freq: Two times a day (BID) | ORAL | 0 refills | Status: DC

## 2019-11-30 MED ORDER — MAGNESIUM GLUCONATE 500 MG PO TABS
250.0000 mg | ORAL_TABLET | Freq: Every day | ORAL | Status: DC
  Administered 2019-11-30 – 2019-12-09 (×10): 250 mg via ORAL
  Filled 2019-11-30 (×10): qty 1

## 2019-11-30 MED ORDER — POLYETHYLENE GLYCOL 3350 17 G PO PACK: 17.0000 g | PACK | Freq: Every day | ORAL | 0 refills | Status: DC | PRN

## 2019-11-30 MED ORDER — PROCHLORPERAZINE MALEATE 5 MG PO TABS: 5.0000 mg | ORAL_TABLET | Freq: Four times a day (QID) | ORAL | Status: DC | PRN

## 2019-11-30 MED ORDER — HYDROXYUREA 500 MG PO CAPS
500.0000 mg | ORAL_CAPSULE | Freq: Every day | ORAL | Status: DC
  Administered 2019-12-01 – 2019-12-10 (×10): 500 mg via ORAL
  Filled 2019-11-30 (×10): qty 1

## 2019-11-30 MED ORDER — ACETAMINOPHEN 325 MG PO TABS
325.0000 mg | ORAL_TABLET | ORAL | Status: DC | PRN
  Administered 2019-12-01 – 2019-12-06 (×2): 650 mg via ORAL
  Filled 2019-11-30 (×2): qty 2

## 2019-11-30 SURGICAL SUPPLY — 2 items
MONITOR REVEAL LINQ II (Prosthesis & Implant Heart) ×1 IMPLANT
PACK LOOP INSERTION (CUSTOM PROCEDURE TRAY) ×2 IMPLANT

## 2019-11-30 NOTE — Progress Notes (Signed)
Physical Therapy Treatment Patient Details Name: Yolanda Flowers MRN: [id number] DOB: 23-Mar-1941 Today's Date: 11/30/2019    History of Present Illness Patient is a 79 year old female who presented to the hospital with increased syncope and pushing to the right. 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 She had a CABG x3 weeks prior. She had returned home and was working on progressing her mobility. PMH: hx of subdural hematoma; L eye blindness following the CABG, anxiety, CAD, bilateral LE edema.     PT Comments    Patient is making progress toward PT goals and tolerated session well. Pt continues to be a great candidate for CIR level therapies.    Follow Up Recommendations  CIR     Equipment Recommendations  Other (comment)(defer to post acute rehab)    Recommendations for Other Services       Precautions / Restrictions Precautions Precautions: Sternal Precaution Comments: Demonstrated appropriate sternal precautions Restrictions Weight Bearing Restrictions: No Other Position/Activity Restrictions: Sternal Precautions    Mobility  Bed Mobility Overal bed mobility: Needs Assistance Bed Mobility: Supine to Sit     Supine to sit: Min guard     General bed mobility comments: Increased time and use of rail.  Transfers Overall transfer level: Needs assistance Equipment used: Rolling walker (2 wheeled) Transfers: Sit to/from Stand Sit to Stand: Min assist         General transfer comment: assist to power up and to steady; cues for safe hand placement  Ambulation/Gait Ambulation/Gait assistance: Min assist;+2 safety/equipment;+2 physical assistance Gait Distance (Feet): (~50 ft total (RW and HHA +2)) Assistive device: Rolling walker (2 wheeled);2 person hand held assist Gait Pattern/deviations: Step-to pattern;Step-through pattern;Decreased step length - right;Decreased step length - left;Decreased dorsiflexion - right;Trunk  flexed;Steppage Gait velocity: decreased   General Gait Details: steppage gait R LE; cues for sequencing, upright posture, and use of AD; assistance required for balance; step through pattern with HHA and step to with RW   Stairs             Wheelchair Mobility    Modified Rankin (Stroke Patients Only) Modified Rankin (Stroke Patients Only) Pre-Morbid Rankin Score: No symptoms Modified Rankin: Moderately severe disability     Balance Overall balance assessment: Needs assistance Sitting-balance support: Bilateral upper extremity supported Sitting balance-Leahy Scale: Fair Sitting balance - Comments: SBA while sitting    Standing balance support: Bilateral upper extremity supported Standing balance-Leahy Scale: Poor Standing balance comment: Reliant on external support bilaterally                            Cognition Arousal/Alertness: Awake/alert Behavior During Therapy: WFL for tasks assessed/performed Overall Cognitive Status: Within Functional Limits for tasks assessed                                        Exercises      General Comments        Pertinent Vitals/Pain Pain Assessment: No/denies pain    Home Living                      Prior Function            PT Goals (current goals can now be found in the care plan section) Acute Rehab PT Goals Patient Stated Goal: to get  back to independent living  Progress towards PT goals: Progressing toward goals    Frequency    Min 3X/week      PT Plan Current plan remains appropriate    Co-evaluation PT/OT/SLP Co-Evaluation/Treatment: Yes Reason for Co-Treatment: For patient/therapist safety;To address functional/ADL transfers PT goals addressed during session: Mobility/safety with mobility;Balance OT goals addressed during session: ADL's and self-care;Strengthening/ROM      AM-PAC PT "6 Clicks" Mobility   Outcome Measure  Help needed turning from your back  to your side while in a flat bed without using bedrails?: A Little Help needed moving from lying on your back to sitting on the side of a flat bed without using bedrails?: A Little Help needed moving to and from a bed to a chair (including a wheelchair)?: A Little Help needed standing up from a chair using your arms (e.g., wheelchair or bedside chair)?: A Little Help needed to walk in hospital room?: A Little Help needed climbing 3-5 steps with a railing? : Total 6 Click Score: 16    End of Session Equipment Utilized During Treatment: Gait belt Activity Tolerance: Patient tolerated treatment well Patient left: in chair;with call bell/phone within reach;with chair alarm set Nurse Communication: Mobility status PT Visit Diagnosis: Unsteadiness on feet (R26.81);Muscle weakness (generalized) (M62.81)     Time: [id number] PT Time Calculation (min) (ACUTE ONLY): 28 min  Charges:  $Gait Training: 8-22 mins                     Earney Navy, PTA Acute Rehabilitation Services Pager: [phone] Office: [phone]     Darliss Cheney 11/30/2019, 12:51 PM

## 2019-11-30 NOTE — H&P (View-Only) (Signed)
Progress Note  Patient Name: Yolanda Flowers Date of Encounter: 11/30/2019  Primary Cardiologist: Jenean Lindau, MD   Subjective   No CP, SOB, hoping to get to rehab soon and hopefully CIR rather then SNF  Inpatient Medications    Scheduled Meds: . aspirin EC  81 mg Oral Daily  . atorvastatin  80 mg Oral q1800  . clopidogrel  75 mg Oral Daily  . enoxaparin (LOVENOX) injection  40 mg Subcutaneous Q24H  . ferrous [id number] C-folic acid  1 capsule Oral BID PC  . hydroxyurea  500 mg Oral Daily  . metoprolol tartrate  25 mg Oral BID  . pantoprazole  40 mg Oral Daily   Continuous Infusions:  PRN Meds: acetaminophen **OR** acetaminophen, alum & mag hydroxide-simeth, polyethylene glycol   Vital Signs    Vitals:   11/29/19 1951 11/30/19 0107 11/30/19 0347 11/30/19 0838  BP: 121/74 125/90 126/83 132/73  Pulse: 81 (!) 105 67 72  Resp: 17 19 17 15   Temp: 97.9 F (36.6 C) 97.9 F (36.6 C) 97.8 F (36.6 C) 97.6 F (36.4 C)  TempSrc: Oral Axillary Oral Oral  SpO2: 93% 95% 94% 95%  Weight:      Height:        Intake/Output Summary (Last 24 hours) at 11/30/2019 0938 Last data filed at 11/29/2019 1800 Gross per 24 hour  Intake 483 ml  Output --  Net 483 ml   Last 3 Weights 11/25/2019 11/24/2019 11/10/2019  Weight (lbs) 162 lb 162 lb 168 lb 3.4 oz  Weight (kg) 73.483 kg 73.483 kg 76.3 kg      Telemetry    SR, one 2 second PAT only - Personally Reviewed   ECG    No new EKGs - Personally Reviewed  Physical Exam   GEN: No acute distress.   Neck: No JVD Cardiac: RRR, no murmurs, rubs, or gallops.  Respiratory: CTA b/l, normal WOB. GI: Soft, nontender, non-distended  MS: No edema; No deformity. Neuro:  Nonfocal  Psych: Normal affect   Labs    High Sensitivity Troponin:   Recent Labs  Lab 11/25/19 0706 11/25/19 0912 11/25/19 1427 11/25/19 1627 11/25/19 2139  TROPONINIHS 24* 139* 257* 275* 254*      Chemistry Recent Labs  Lab 11/24/19 1058  11/24/19 1058 11/25/19 0706 11/25/19 0706 11/26/19 0418 11/26/19 0418 11/27/19 0443 11/28/19 0426 11/29/19 0412  NA 142  --  141   < > 142   < > 140 140 141  K 5.1   < > 4.5   < > 4.2   < > 4.0 4.1 4.3  CL 103   < > 104   < > 106   < > 106 108 107  CO2 22   < > 25   < > 22   < > 22 22 23   GLUCOSE 114*  --  103*   < > 85   < > 81 99 97  BUN 17  --  12   < > 12   < > 9 13 13   CREATININE 1.24*   < > 1.15*   < > 1.20*   < > 1.03* 1.10* 1.10*  CALCIUM 9.5   < > 9.4   < > 8.8*   < > 8.8* 8.7* 9.2  PROT 7.5  --  7.5  --  7.2  --   --   --   --   ALBUMIN 4.3  --  3.8  --  3.6  --   --   --   --  AST 27  --  27  --  21  --   --   --   --   ALT 14  --  15  --  15  --   --   --   --   ALKPHOS 109  --  76  --  76  --   --   --   --   BILITOT 0.5  --  0.8  --  1.0  --   --   --   --   GFRNONAA 41*   < > 45*   < > 43*   < > 52* 48* 48*  GFRAA 48*   < > 52*   < > 50*   < > 60* 55* 55*  ANIONGAP  --   --  12   < > 14   < > 12 10 11    < > = values in this interval not displayed.     Hematology Recent Labs  Lab 11/27/19 0443 11/28/19 0426 11/29/19 0412  WBC 16.9* 16.9* 18.7*  RBC 3.51* 3.63* 3.72*  HGB 11.3* 11.8* 12.1  HCT 35.7* 37.1 38.5  MCV 101.7* 102.2* 103.5*  MCH 32.2 32.5 32.5  MCHC 31.7 31.8 31.4  RDW 21.6* 21.6* 21.5*  PLT 151 154 177    BNPNo results for input(s): BNP, PROBNP in the last 168 hours.   DDimer No results for input(s): DDIMER in the last 168 hours.   Radiology      Cardiac Studies   Echo 11/25/19 1. Left ventricular ejection fraction, by estimation, is 65 to 70%. The  left ventricle has normal function. The left ventricle has no regional  wall motion abnormalities. There is mild left ventricular hypertrophy.  Left ventricular diastolic parameters  are consistent with Grade I diastolic dysfunction (impaired relaxation).  2. Right ventricular systolic function is normal. The right ventricular  size is normal.  3. The mitral valve is normal in  structure. No evidence of mitral valve  regurgitation. No evidence of mitral stenosis.  4. The aortic valve is tricuspid. Aortic valve regurgitation is not  visualized. Mild aortic valve sclerosis is present, with no evidence of  aortic valve stenosis.  5. The inferior vena cava is normal in size with greater than 50%  respiratory variability, suggesting right atrial pressure of 3 mmHg.    -carotid dopplers unremarkable (1-39% bilaterally)  Patient Profile     79 y.o. female with PMH hypertension, hyperlipidemia, OSA, CAD s/p CABG 11/03/19 (Dr. Cyndia Bent) complicated by visual changes, now found to have CVA based on MRI  Assessment & Plan    1. Cryptogenic stroke     Bilateral scattered posterior and anterior infarcts, cardioembolic pattern, occult afib vs. Procedure related with aorta cross clamping       EP consultation done 11/26/2019 in d/w neurology and hematology, Dr. Caryl Comes notes Concerns exist as to whether aortic crossclamping could have contributed.  Also spoke w Dr Hinton Rao (hematology) who has no problems with using anticoagulation if indicated; indeed thinks it may become separately indicated( if I understood her correctly.)  Planned to proceed with loop implant once discharge timing became clearer  Insurance process is underway, I have d/w Rehab staff, would expect a decision regarding CIR vs SF today/tomorrow   She ha no f/u questions after talk with Dr Caryl Comes last week, remains agreeable to loop I have reviewed wound care with the patient EP/wound check is in place     For questions or updates,  please contact [place] Please consult [email or web address] for contact info under        Signed, Baldwin Jamaica, PA-C  11/30/2019, 9:38 AM   I have seen, examined the patient, and reviewed the above assessment and plan.  Changes to above are made where necessary.  On exam, RRR.      The patient presents with cryptogenic stroke.  I agree with Dr Caryl Comes that ILR is indicated.  Risks, benefits, and alteratives to implantable loop recorder were discussed with the patient today.   At this time, the patient is very clear in their decision to proceed with implantable loop recorder.   Thompson Grayer MD, [place] 11/30/2019 2:40 PM

## 2019-11-30 NOTE — H&P (Signed)
Physical Medicine and Rehabilitation Admission H&P    Chief Complaint  Patient presents with  . Functional decline.     HPI:  Yolanda Flowers is a 79 year old female with history of HTN, chronic thrombocytosis, CAD s/p CABG 10/29/2019 who was admitted on 11/25/2019 with LUE weakness and visual changes.  Patient reported visual changes after surgery with progressive loss of vision in left eye and decreased vision in right eye 2 days after discharge from CABG.   CTA head/neck was negative for acute abnormality and showed moderate to marked chronic microvascular changes.  MRI brain done revealing scattered bilateral acute cerebral and cerebellar infarcts and small subacute acute on chronic left frontal SDH.  Stroke was felt to be embolic and neurology recommended DAPT x3-week as well as TEE/loop recorder for work-up.  2D echo showed EF of 65 to 70% with no wall abnormality and mild aortic sclerosis.  BLE Dopplers were negative for DVT.  Cardiology recommended embolism due to clamping of aorta or cardioembolic in nature.  She underwent loop recorder placement on 04/13 by Dr. Rayann Heman.  Patient with resultant decreased endurance, right-sided weakness and visual deficits affecting ADLs and mobility.  CIR was recommended due to functional decline   Pt reports before stroke, had an episode when woke up and couldn't see from L eye and legs feel floppy, like k=jelly.  Coughing a lot, whenever she talks- doesn't have ot eat or drink to cough- is new.    Review of Systems  Constitutional: Negative for fever.  HENT: Negative for hearing loss and tinnitus.   Eyes: Negative for blurred vision (Loss of vision in left upper field and right vision blurry).       Right vision is back to normal--still has field cut in left.   Respiratory: Negative for cough and shortness of breath (better).   Cardiovascular: Positive for chest pain (incision is "sore as a boil"). Negative for leg swelling.  Gastrointestinal:  Negative for abdominal pain, constipation and heartburn.  Genitourinary: Negative for dysuria and urgency.  Musculoskeletal: Positive for back pain (chronic--used oxycodone).  Skin: Negative for rash.  Neurological: Positive for sensory change and weakness. Negative for dizziness and headaches.  Psychiatric/Behavioral: The patient does not have insomnia.   All other systems reviewed and are negative.     Past Medical History:  Diagnosis Date  . Anxiety   . Arthritis   . Asthma   . Bilateral edema of lower extremity   . Complication of anesthesia    difficulty waking  . Congestive heart failure (CHF) ([place])   . COPD (chronic obstructive pulmonary disease) ([place])   . Coronary artery disease   . Dyspnea on exertion   . GERD (gastroesophageal reflux disease)   . History of melanoma excision    left greast toe 2015  . History of squamous cell carcinoma excision    face--  multiple excisions  . History of subdural hematoma    2008  . Hypertension   . OSA (obstructive sleep apnea)    intolerant  . Thrombocytosis ([place])    takes hydoxyurea--  MONITORED BY DR Hinton [facility])  . VIN III (vulvar intraepithelial neoplasia III)   . Wears dentures    UPPER AND LOWER PARTIAL  . Wears glasses     Past Surgical History:  Procedure Laterality Date  . ABDOMINAL HYSTERECTOMY  age 72  . CARDIAC CATHETERIZATION  03/172021  . CORONARY ARTERY BYPASS GRAFT N/A 11/04/2019   Procedure: CORONARY  ARTERY BYPASS GRAFTING (CABG) x 4, with ENDOSCOPIC HARVESTING OF RIGHT GREATER SAPHENOUS VEIN.;  Surgeon: Gaye Pollack, MD;  Location: [facility];  Service: Open Heart Surgery;  Laterality: N/A;  . INTRAVASCULAR ULTRASOUND/IVUS N/A 11/03/2019   Procedure: Intravascular Ultrasound/IVUS;  Surgeon: Nelva Bush, MD;  Location: [place] [facility];  Service: Cardiovascular;  Laterality: N/A;  . KNEE ARTHROSCOPY Left 2004  . LEFT HEART CATH AND CORONARY ANGIOGRAPHY N/A 11/03/2019   Procedure:  LEFT HEART CATH AND CORONARY ANGIOGRAPHY;  Surgeon: Nelva Bush, MD;  Location: [place] [facility];  Service: Cardiovascular;  Laterality: N/A;  . MELANOMA EXCISION  2015   left great toe  . REPAIR PERONEAL TENDONS ANKLE  2004  . SUBDURAL HEMATOMA EVACUATION VIA CRANIOTOMY  2008      week later  post-op  [facility]  . TEE WITHOUT CARDIOVERSION N/A 11/04/2019   Procedure: TRANSESOPHAGEAL ECHOCARDIOGRAM (TEE);  Surgeon: Gaye Pollack, MD;  Location: [place];  Service: Open Heart Surgery;  Laterality: N/A;  . VULVECTOMY N/A 10/24/2015   Procedure: WIDE LOCAL EXCISION OF THE VULVA ;  Surgeon: Everitt Amber, MD;  Location: [place];  Service: Gynecology;  Laterality: N/A;    Family History  Problem Relation Age of Onset  . Clotting disorder Mother   . Hypertension Mother   . Heart disease Mother   . Arthritis Mother   . Stroke Father   . Hypertension Father   . Heart disease Father   . Arthritis Father   . Emphysema Father   . Asthma Father   . Kidney failure Father   . Hypertension Sister   . Heart disease Sister   . Arthritis Sister   . Hypertension Brother   . Heart disease Brother   . Arthritis Brother     Social History: Married. Independent but has been limited by angina and SOB for months. Used a cane since surgery. She reports that she has never smoked-lives with smoker. She has never used smokeless tobacco. She reports that she does not drink alcohol or use drugs.    Allergies  Allergen Reactions  . Cephalexin Hives  . Ciprofloxacin Other (See Comments)    Gi intolerance  . Penicillins Rash    Childhood reaction Did it involve swelling of the face/tongue/throat, SOB, or low BP? No Did it involve sudden or severe rash/hives, skin peeling, or any reaction on the inside of your mouth or nose? No Did you need to seek medical attention at a hospital or doctor's office? No When did it last happen?50 years ago If all above answers are "NO", may  proceed with cephalosporin use.     Medications Prior to Admission  Medication Sig Dispense Refill  . ammonium lactate (LAC-HYDRIN) 12 % lotion Apply 1 application topically daily as needed (skin spots).     . Ascorbic Acid (VITAMIN C) 1000 MG tablet Take 1,000 mg by mouth daily.    Derrill Memo ON 12/01/2019] aspirin EC 81 MG EC tablet Take 1 tablet (81 mg total) by mouth daily. 30 tablet 0  . atorvastatin (LIPITOR) 80 MG tablet Take 1 tablet (80 mg total) by mouth daily at 6 PM. 30 tablet 0  . Calcium Carb-Cholecalciferol (CALCIUM 600 + D PO) Take 1 tablet by mouth in the morning and at bedtime.     Derrill Memo ON 12/01/2019] clopidogrel (PLAVIX) 75 MG tablet Take 1 tablet (75 mg total) by mouth daily. 30 tablet 0  . erythromycin ophthalmic ointment Place  1 application into both eyes daily as needed (pain/ irritation).     . ferrous [id number] C-folic acid (TRINSICON / FOLTRIN) capsule Take 1 capsule by mouth 2 (two) times daily after a meal. 60 capsule 1  . fluticasone (FLONASE) 50 MCG/ACT nasal spray Place 2 sprays into both nostrils at bedtime.     . hydroxyurea (HYDREA) 500 MG capsule Take 500 mg by mouth daily.     . lansoprazole (PREVACID) 30 MG capsule Take 30 mg by mouth every morning.     . Magnesium 250 MG TABS Take 250 mg by mouth at bedtime.     . metoprolol tartrate (LOPRESSOR) 25 MG tablet Take 1 tablet (25 mg total) by mouth 2 (two) times daily. 30 tablet 0  . Multiple Vitamins-Minerals (PRESERVISION AREDS 2) CAPS Take 1 capsule by mouth in the morning and at bedtime.    . mupirocin ointment (BACTROBAN) 2 % Apply 1 application topically daily as needed (after cancer removal procedures).     . polyethylene glycol (MIRALAX / GLYCOLAX) 17 g packet Take 17 g by mouth daily as needed for mild constipation. 14 each 0  . Vitamin D, Ergocalciferol, (DRISDOL) 1.25 MG (50000 UT) CAPS capsule Take 50,000 Units by mouth every 14 (fourteen) days.      Drug Regimen Review  Drug regimen  was reviewed and remains appropriate with no significant issues identified  Home:     Functional History:    Functional Status:  Mobility:          ADL:    Cognition: Cognition Orientation Level: Oriented X4     Blood pressure 111/69, pulse 74, temperature 99 F (37.2 C), temperature source Oral, resp. rate 17, height 5\' 6"  (1.676 m), weight 72.4 kg, SpO2 97 %. Physical Exam  Nursing note and vitals reviewed. Constitutional: She is oriented to person, place, and time. She appears well-developed and well-nourished.  Older female in room, sitting up, daughter in law at bedside, NAD  HENT:  Head: Normocephalic and atraumatic.  Mild R facial droop Tongue midline Facial sensation intact  Eyes: Conjunctivae are normal.  ectropion left due to biopsy. L lower lid lag L visual fields are absent and also lower 1/2 of L visual field worse  Neck: No tracheal deviation present.  Cardiovascular:  RRR- no JVD Chest incision healed from CABG 3/18  Respiratory: No stridor.  CTA B/L- coughing with talking, but no upper airway sounds or wheezing, rale,s rhrochi heard  GI: She exhibits no distension.  Soft, NT, ND, (+)BS  Musculoskeletal:        General: No edema.     Cervical back: Normal range of motion and neck supple.     Comments: UEs 5-/5 in deltoids, biceps, triceps, WE, grip and finger abd  LEs- appears equal as well- HF 4+/5, KE 4+/5, DF and PF 5-/5 B/L  Neurological: She is alert and oriented to person, place, and time.  Intact to light touch in all 4 extremities Slowed finger to nose B/L- R slightly worse  Skin: Skin is warm and dry.  R anterior calf dark bruising- also sign of healing from vein harvesting  Psychiatric: She has a normal mood and affect. Her behavior is normal.    Results for orders placed or performed during the hospital encounter of 11/25/19 (from the past 48 hour(s))  CBC with Differential/Platelet     Status: Abnormal   Collection Time:  11/29/19  4:12 AM  Result Value Ref Range   WBC 18.7 (H)  4.0 - 10.5 K/uL    Comment: WHITE COUNT CONFIRMED ON SMEAR   RBC 3.72 (L) 3.87 - 5.11 MIL/uL   Hemoglobin 12.1 12.0 - 15.0 g/dL   HCT 38.5 36.0 - 46.0 %   MCV 103.5 (H) 80.0 - 100.0 fL   MCH 32.5 26.0 - 34.0 pg   MCHC 31.4 30.0 - 36.0 g/dL   RDW 21.5 (H) 11.5 - 15.5 %   Platelets 177 150 - 400 K/uL   nRBC 0.3 (H) 0.0 - 0.2 %   Neutrophils Relative % 69 %   Neutro Abs 12.8 (H) 1.7 - 7.7 K/uL   Lymphocytes Relative 9 %   Lymphs Abs 1.7 0.7 - 4.0 K/uL   Monocytes Relative 9 %   Monocytes Absolute 1.8 (H) 0.1 - 1.0 K/uL   Eosinophils Relative 4 %   Eosinophils Absolute 0.7 (H) 0.0 - 0.5 K/uL   Basophils Relative 2 %   Basophils Absolute 0.4 (H) 0.0 - 0.1 K/uL   Immature Granulocytes 7 %   Abs Immature Granulocytes 1.33 (H) 0.00 - 0.07 K/uL   Polychromasia PRESENT    Ovalocytes PRESENT     Comment: Performed at [facility], [address] [place], [place] [id number]  Basic metabolic panel     Status: Abnormal   Collection Time: 11/29/19  4:12 AM  Result Value Ref Range   Sodium 141 135 - 145 mmol/L   Potassium 4.3 3.5 - 5.1 mmol/L   Chloride 107 98 - 111 mmol/L   CO2 23 22 - 32 mmol/L   Glucose, Bld 97 70 - 99 mg/dL    Comment: Glucose reference range applies only to samples taken after fasting for at least 8 hours.   BUN 13 8 - 23 mg/dL   Creatinine, Ser 1.10 (H) 0.44 - 1.00 mg/dL   Calcium 9.2 8.9 - 10.3 mg/dL   GFR calc non Af Amer 48 (L) >60 mL/min   GFR calc Af Amer 55 (L) >60 mL/min   Anion gap 11 5 - 15    Comment: Performed at [facility] [address] [place], [place] [address]  Glucose, capillary     Status: None   Collection Time: 11/30/19  5:03 PM  Result Value Ref Range   Glucose-Capillary 83 70 - 99 mg/dL    Comment: Glucose reference range applies only to samples taken after fasting for at least 8 hours.   Comment 1 Notify RN    Comment 2 Document in Chart    EP PPM/ICD  IMPLANT  Result Date: 11/30/2019 SURGEON:  Thompson Grayer, MD   PREPROCEDURE DIAGNOSIS:  Cryptogenic Stroke   POSTPROCEDURE DIAGNOSIS:  Cryptogenic Stroke    PROCEDURES:  1. Implantable loop recorder implantation   INTRODUCTION:  Yolanda Flowers is a 79 y.o. female with a history of unexplained stroke who presents today for implantable loop implantation.  The patient has had a cryptogenic stroke.  Despite an extensive workup by neurology, no reversible causes have been identified.  she has worn telemetry during which she did not have arrhythmias.  There is significant concern for possible atrial fibrillation as the cause for the patients stroke.  The patient therefore presents today for implantable loop implantation.   DESCRIPTION OF PROCEDURE:  Informed written consent was obtained.  The patient required no sedation for the procedure today.  The patients left chest was prepped and draped. Mapping over the patient's chest was performed to identify the appropriate ILR site.  This  area was found to be the left parasternal region over the 3rd-4th intercostal space.  The skin overlying this region was infiltrated with lidocaine for local analgesia.  A 0.5-cm incision was made at the implant site.  A subcutaneous ILR pocket was fashioned using a combination of sharp and blunt dissection.  A Medtronic Reveal Linq model [id number] implantable loop recorder was then placed into the pocket R waves were very prominent and measured > 0.2 mV. EBL<1 ml.  Steri- Strips and a sterile dressing were then applied.  There were no early apparent complications.   CONCLUSIONS:  1. Successful implantation of a Medtronic Reveal LINQ implantable loop recorder for cryptogenic stroke  2. No early apparent complications. Thompson Grayer MD, [facility] 11/30/2019 2:59 PM   DG Swallowing Func-Speech Pathology  Result Date: 11/29/2019 Objective Swallowing Evaluation: Type of Study: MBS-Modified Barium Swallow Study  Patient Details Name: OLIA BAINE MRN:  [id number] Date of Birth: 1940/11/06 Today's Date: 11/29/2019 Time: SLP Start Time (ACUTE ONLY): 0910 -SLP Stop Time (ACUTE ONLY): 0930 SLP Time Calculation (min) (ACUTE ONLY): 20 min Past Medical History: Past Medical History: Diagnosis Date . Anxiety  . Arthritis  . Asthma  . Bilateral edema of lower extremity  . Complication of anesthesia   difficulty waking . Congestive heart failure (CHF) ([place])  . COPD (chronic obstructive pulmonary disease) ([place])  . Coronary artery disease  . Dyspnea on exertion  . GERD (gastroesophageal reflux disease)  . History of melanoma excision   left greast toe 2015 . History of squamous cell carcinoma excision   face--  multiple excisions . History of subdural hematoma   2008 . Hypertension  . OSA (obstructive sleep apnea)   intolerant . Thrombocytosis ([place])   takes hydoxyurea--  MONITORED BY DR Hinton Rao [facility]) . VIN III (vulvar intraepithelial neoplasia III)  . Wears dentures   UPPER AND LOWER PARTIAL . Wears glasses  Past Surgical History: Past Surgical History: Procedure Laterality Date . ABDOMINAL HYSTERECTOMY  age 77 . CARDIAC CATHETERIZATION  03/172021 . CORONARY ARTERY BYPASS GRAFT N/A 11/04/2019  Procedure: CORONARY ARTERY BYPASS GRAFTING (CABG) x 4, with ENDOSCOPIC HARVESTING OF RIGHT GREATER SAPHENOUS VEIN.;  Surgeon: Gaye Pollack, MD;  Location: [place] OR;  Service: Open Heart Surgery;  Laterality: N/A; . INTRAVASCULAR ULTRASOUND/IVUS N/A 11/03/2019  Procedure: Intravascular Ultrasound/IVUS;  Surgeon: Nelva Bush, MD;  Location: [place] [facility];  Service: Cardiovascular;  Laterality: N/A; . KNEE ARTHROSCOPY Left 2004 . LEFT HEART CATH AND CORONARY ANGIOGRAPHY N/A 11/03/2019  Procedure: LEFT HEART CATH AND CORONARY ANGIOGRAPHY;  Surgeon: Nelva Bush, MD;  Location: [place] [facility];  Service: Cardiovascular;  Laterality: N/A; . MELANOMA EXCISION  2015  left great toe . REPAIR PERONEAL TENDONS ANKLE  2004 . SUBDURAL HEMATOMA EVACUATION VIA CRANIOTOMY   2008     week later  post-op  [facility] . TEE WITHOUT CARDIOVERSION N/A 11/04/2019  Procedure: TRANSESOPHAGEAL ECHOCARDIOGRAM (TEE);  Surgeon: Gaye Pollack, MD;  Location: [place];  Service: Open Heart Surgery;  Laterality: N/A; . VULVECTOMY N/A 10/24/2015  Procedure: WIDE LOCAL EXCISION OF THE VULVA ;  Surgeon: Everitt Amber, MD;  Location: [place];  Service: Gynecology;  Laterality: N/A; HPI: 79 Y/O F with PMX of VIN III, HTN, Thrombocytosis, HTN, OSA, hx of subdural hematoma, GERD, COPD, CAD, CHF presented with hx of LL weakness while she tried to go to the bathroom this morning. She denies any fall or LOC. She endorsed associated headache, no slurring  of speech, or drooping of her face. She endorsed right LL swelling and pain following CABG harvesting. She is on A/B for cellulitis of her right LL. She denies chest pain, no SOB or cough. Recent hx of left vision changes for which her ophthalmologist recently evaluated her.  No data recorded Assessment / Plan / Recommendation CHL IP CLINICAL IMPRESSIONS 11/29/2019 Clinical Impression Pt demonstrates adequate laryngeal closure with slightly incomplete vestibular closure leading to very trace silent frank penetration events during the swallow. In most cases, penetrate is ejected with subsequent swallow, and despite lack of cough or throat clear response no barium passed into the subglottis. A cue for a light throat clear consistently ejected penetrate and a deep chin tuck with a straw prevented pentration (head turn ineffective). However, pt likely to remain safe from significant aspiration even if no interventions are implemented. Deficts may even be more baseline and WNL for age rather than acute. Esophageal sweep unrevealing. Recommend pt continue a regular diet and thin liquids with f/u education for a throat clear strategy or a chin tuck strategy if coughing continues to occur during meals.  SLP Visit Diagnosis Dysphagia, unspecified  (R13.10) Attention and concentration deficit following -- Frontal lobe and executive function deficit following -- Impact on safety and function --   CHL IP TREATMENT RECOMMENDATION 11/29/2019 Treatment Recommendations Therapy as outlined in treatment plan below   Prognosis 11/29/2019 Prognosis for Safe Diet Advancement Good Barriers to Reach Goals -- Barriers/Prognosis Comment -- CHL IP DIET RECOMMENDATION 11/29/2019 SLP Diet Recommendations Regular solids;Thin liquid Liquid Administration via Cup;Straw Medication Administration Whole meds with liquid Compensations Slow rate;Small sips/bites;Chin tuck;Clear throat intermittently Postural Changes --   CHL IP OTHER RECOMMENDATIONS 11/29/2019 Recommended Consults -- Oral Care Recommendations Oral care BID Other Recommendations --   CHL IP FOLLOW UP RECOMMENDATIONS 11/29/2019 Follow up Recommendations Inpatient Rehab   CHL IP FREQUENCY AND DURATION 11/29/2019 Speech Therapy Frequency (ACUTE ONLY) min 2x/week Treatment Duration 1 week      CHL IP ORAL PHASE 11/29/2019 Oral Phase WFL Oral - Pudding Teaspoon -- Oral - Pudding Cup -- Oral - Honey Teaspoon -- Oral - Honey Cup -- Oral - Nectar Teaspoon -- Oral - Nectar Cup -- Oral - Nectar Straw -- Oral - Thin Teaspoon -- Oral - Thin Cup -- Oral - Thin Straw -- Oral - Puree -- Oral - Mech Soft -- Oral - Regular -- Oral - Multi-Consistency -- Oral - Pill -- Oral Phase - Comment --  CHL IP PHARYNGEAL PHASE 11/29/2019 Pharyngeal Phase Impaired Pharyngeal- Pudding Teaspoon -- Pharyngeal -- Pharyngeal- Pudding Cup -- Pharyngeal -- Pharyngeal- Honey Teaspoon -- Pharyngeal -- Pharyngeal- Honey Cup -- Pharyngeal -- Pharyngeal- Nectar Teaspoon -- Pharyngeal -- Pharyngeal- Nectar Cup -- Pharyngeal -- Pharyngeal- Nectar Straw -- Pharyngeal -- Pharyngeal- Thin Teaspoon -- Pharyngeal -- Pharyngeal- Thin Cup Penetration/Aspiration during swallow;Reduced epiglottic inversion;Compensatory strategies attempted (with notebox) Pharyngeal Material  enters airway, CONTACTS cords and not ejected out;Material enters airway, remains ABOVE vocal cords and not ejected out;Material does not enter airway;Material enters airway, CONTACTS cords and then ejected out Pharyngeal- Thin Straw Penetration/Aspiration during swallow;Compensatory strategies attempted (with notebox) Pharyngeal Material enters airway, CONTACTS cords and then ejected out;Material enters airway, CONTACTS cords and not ejected out;Material enters airway, remains ABOVE vocal cords and not ejected out;Material does not enter airway;Material enters airway, remains ABOVE vocal cords then ejected out Pharyngeal- Puree -- Pharyngeal -- Pharyngeal- Mechanical Soft -- Pharyngeal -- Pharyngeal- Regular -- Pharyngeal -- Pharyngeal- Multi-consistency -- Pharyngeal -- Pharyngeal-  Pill -- Pharyngeal -- Pharyngeal Comment --  CHL IP CERVICAL ESOPHAGEAL PHASE 11/29/2019 Cervical Esophageal Phase WFL Pudding Teaspoon -- Pudding Cup -- Honey Teaspoon -- Honey Cup -- Nectar Teaspoon -- Nectar Cup -- Nectar Straw -- Thin Teaspoon -- Thin Cup -- Thin Straw -- Puree -- Mechanical Soft -- Regular -- Multi-consistency -- Pill -- Cervical Esophageal Comment -- Herbie Baltimore, MA CCC-SLP Acute Rehabilitation Services Pager [phone] Office [phone] Lynann Beaver 11/29/2019, 10:26 AM                  Medical Problem List and Plan: 1.  Impaired Function, ADLs and moblity secondary to B/L embolic strokes- thalamus, corpus callosum, and Basal ganglia  -patient may  Shower- still has sternal precautions from previous CABG 11/04/19  -ELOS/Goals: 2-2.5 weeks; mod I to Supervision 2.  Antithrombotics: -DVT/anticoagulation:  Pharmaceutical: Lovenox  -antiplatelet therapy: DAPT x 3 weeks followed by ASA  3. Chronic back pain.Pain Management: Worse with activity--used oxycodone--does not want tramadol "it causes stroke" 4. Mood: LCSW to follow for evaluation and support  -antipsychotic agents: N/A 5.  Neuropsych: This patient is capable of making decisions on her own behalf. 6. Skin/Wound Care: Routine pressure relief measures. Cellulitis has resolved.  7. Fluids/Electrolytes/Nutrition: Monitor I/O. Check lytes in am. 8. CAD s/p CABG 10/29/19: Continue sternal precautions.  9. Allergies: Uses flonase at nights.  10. HTN: Monitor BP tid--continue  11.  Thrombocytosis: Leucocytosis due to hydrea--take 50,000 units every 2 weeks. . She has been evaluated by Hem/Onc--WBC around 18, 0000 due to hydrea. Will continue to monitor for fevers or other signs of infection.  13. Dyslipidemia: On Lipitor      Ivan Anchors. Love, PA-C 11/30/2019  I have personally performed a face to face diagnostic evaluation of this patient and formulated the key components of the plan.  Additionally, I have personally reviewed laboratory data, imaging studies, as well as relevant notes and concur with the physician assistant's documentation above.   The patient's status has not changed from the original H&P.  Any changes in documentation from the acute care chart have been noted above.     Courtney Heys, MD 11/30/2019

## 2019-11-30 NOTE — Progress Notes (Signed)
To cath lab for loop recorder.

## 2019-11-30 NOTE — Progress Notes (Addendum)
Progress Note  Patient Name: Yolanda Flowers Date of Encounter: 11/30/2019  Primary Cardiologist: Jenean Lindau, MD   Subjective   No CP, SOB, hoping to get to rehab soon and hopefully CIR rather then SNF  Inpatient Medications    Scheduled Meds: . aspirin EC  81 mg Oral Daily  . atorvastatin  80 mg Oral q1800  . clopidogrel  75 mg Oral Daily  . enoxaparin (LOVENOX) injection  40 mg Subcutaneous Q24H  . ferrous [id number] C-folic acid  1 capsule Oral BID PC  . hydroxyurea  500 mg Oral Daily  . metoprolol tartrate  25 mg Oral BID  . pantoprazole  40 mg Oral Daily   Continuous Infusions:  PRN Meds: acetaminophen **OR** acetaminophen, alum & mag hydroxide-simeth, polyethylene glycol   Vital Signs    Vitals:   11/29/19 1951 11/30/19 0107 11/30/19 0347 11/30/19 0838  BP: 121/74 125/90 126/83 132/73  Pulse: 81 (!) 105 67 72  Resp: 17 19 17 15   Temp: 97.9 F (36.6 C) 97.9 F (36.6 C) 97.8 F (36.6 C) 97.6 F (36.4 C)  TempSrc: Oral Axillary Oral Oral  SpO2: 93% 95% 94% 95%  Weight:      Height:        Intake/Output Summary (Last 24 hours) at 11/30/2019 0938 Last data filed at 11/29/2019 1800 Gross per 24 hour  Intake 483 ml  Output -  Net 483 ml   Last 3 Weights 11/25/2019 11/24/2019 11/10/2019  Weight (lbs) 162 lb 162 lb 168 lb 3.4 oz  Weight (kg) 73.483 kg 73.483 kg 76.3 kg      Telemetry    SR, one 2 second PAT only - Personally Reviewed   ECG    No new EKGs - Personally Reviewed  Physical Exam   GEN: No acute distress.   Neck: No JVD Cardiac: RRR, no murmurs, rubs, or gallops.  Respiratory: CTA b/l, normal WOB. GI: Soft, nontender, non-distended  MS: No edema; No deformity. Neuro:  Nonfocal  Psych: Normal affect   Labs    High Sensitivity Troponin:   Recent Labs  Lab 11/25/19 0706 11/25/19 0912 11/25/19 1427 11/25/19 1627 11/25/19 2139  TROPONINIHS 24* 139* 257* 275* 254*      Chemistry Recent Labs  Lab 11/24/19 1058  11/24/19 1058 11/25/19 0706 11/25/19 0706 11/26/19 0418 11/26/19 0418 11/27/19 0443 11/28/19 0426 11/29/19 0412  NA 142  --  141   < > 142   < > 140 140 141  K 5.1   < > 4.5   < > 4.2   < > 4.0 4.1 4.3  CL 103   < > 104   < > 106   < > 106 108 107  CO2 22   < > 25   < > 22   < > 22 22 23   GLUCOSE 114*  --  103*   < > 85   < > 81 99 97  BUN 17  --  12   < > 12   < > 9 13 13   CREATININE 1.24*   < > 1.15*   < > 1.20*   < > 1.03* 1.10* 1.10*  CALCIUM 9.5   < > 9.4   < > 8.8*   < > 8.8* 8.7* 9.2  PROT 7.5  --  7.5  --  7.2  --   --   --   --   ALBUMIN 4.3  --  3.8  --  3.6  --   --   --   --  AST 27  --  27  --  21  --   --   --   --   ALT 14  --  15  --  15  --   --   --   --   ALKPHOS 109  --  76  --  76  --   --   --   --   BILITOT 0.5  --  0.8  --  1.0  --   --   --   --   GFRNONAA 41*   < > 45*   < > 43*   < > 52* 48* 48*  GFRAA 48*   < > 52*   < > 50*   < > 60* 55* 55*  ANIONGAP  --   --  12   < > 14   < > 12 10 11    < > = values in this interval not displayed.     Hematology Recent Labs  Lab 11/27/19 0443 11/28/19 0426 11/29/19 0412  WBC 16.9* 16.9* 18.7*  RBC 3.51* 3.63* 3.72*  HGB 11.3* 11.8* 12.1  HCT 35.7* 37.1 38.5  MCV 101.7* 102.2* 103.5*  MCH 32.2 32.5 32.5  MCHC 31.7 31.8 31.4  RDW 21.6* 21.6* 21.5*  PLT 151 154 177    BNPNo results for input(s): BNP, PROBNP in the last 168 hours.   DDimer No results for input(s): DDIMER in the last 168 hours.   Radiology      Cardiac Studies   Echo 11/25/19 1. Left ventricular ejection fraction, by estimation, is 65 to 70%. The  left ventricle has normal function. The left ventricle has no regional  wall motion abnormalities. There is mild left ventricular hypertrophy.  Left ventricular diastolic parameters  are consistent with Grade I diastolic dysfunction (impaired relaxation).  2. Right ventricular systolic function is normal. The right ventricular  size is normal.  3. The mitral valve is normal in  structure. No evidence of mitral valve  regurgitation. No evidence of mitral stenosis.  4. The aortic valve is tricuspid. Aortic valve regurgitation is not  visualized. Mild aortic valve sclerosis is present, with no evidence of  aortic valve stenosis.  5. The inferior vena cava is normal in size with greater than 50%  respiratory variability, suggesting right atrial pressure of 3 mmHg.    -carotid dopplers unremarkable (1-39% bilaterally)  Patient Profile     79 y.o. female with PMH hypertension, hyperlipidemia, OSA, CAD s/p CABG 11/03/19 (Dr. Cyndia Bent) complicated by visual changes, now found to have CVA based on MRI  Assessment & Plan    1. Cryptogenic stroke     Bilateral scattered posterior and anterior infarcts, cardioembolic pattern, occult afib vs. Procedure related with aorta cross clamping       EP consultation done 11/26/2019 in d/w neurology and hematology, Dr. Caryl Comes notes Concerns exist as to whether aortic crossclamping could have contributed.  Also spoke w Dr Hinton Rao (hematology) who has no problems with using anticoagulation if indicated; indeed thinks it may become separately indicated( if I understood her correctly.)  Planned to proceed with loop implant once discharge timing became clearer  Insurance process is underway, I have d/w Rehab staff, would expect a decision regarding CIR vs SF today/tomorrow   She ha no f/u questions after talk with Dr Caryl Comes last week, remains agreeable to loop I have reviewed wound care with the patient EP/wound check is in place     For questions or updates,  please contact [place] Please consult [email or web address] for contact info under        Signed, Baldwin [place], PA-C  11/30/2019, 9:38 AM   I have seen, examined the patient, and reviewed the above assessment and plan.  Changes to above are made where necessary.  On exam, RRR.      The patient presents with cryptogenic stroke.  I agree with Dr Caryl Comes that ILR is indicated.  Risks, benefits, and alteratives to implantable loop recorder were discussed with the patient today.   At this time, the patient is very clear in their decision to proceed with implantable loop recorder.   Thompson Grayer MD, [place] 11/30/2019 2:40 PM

## 2019-11-30 NOTE — Progress Notes (Signed)
Pt arrived to unit via bed, son Jaquilla Woodroof and his wife Allyne Hebert are visitors, pt is alert and able to make needs known, IV to RAC removed due to redness and drainage, small skin tear to area just below RAC, bruising to right lower ext and foot for bypass with scabbed area to mid anterior shin, scattered bruising noted to bilateral arms and chest with healing midline incision that is approximated without drainage. Family and pt oriented to rehab. Pt needs met, call bell in reach.

## 2019-11-30 NOTE — Progress Notes (Signed)
Inpatient Rehabilitation Admissions Coordinator  I have received approval and have a CIR bed available to admit patient to today. I met with patient at bedside and she is in agreement. I have notified acute team and resident at [phone]. I contacted her daughter in law by phone, [name] and she is aware. I will make the arrangements to admit today.  Danne Baxter, RN, MSN Rehab Admissions Coordinator [phone] 11/30/2019 2:18 PM

## 2019-11-30 NOTE — Progress Notes (Signed)
SLP Cancellation Note  Patient Details Name: Yolanda Flowers MRN: [id number] DOB: Aug 17, 1941   Cancelled treatment:       Reason Eval/Treat Not Completed: Patient at procedure or test/unavailable    Osie Bond., M.A. [place] Acute Rehabilitation Services Pager [phone] Office [phone]  11/30/2019, 2:43 PM

## 2019-11-30 NOTE — TOC Transition Note (Signed)
Transition of Care [facility]) - CM/SW Discharge Note   Patient Details  Name: RAELENE SIRES MRN: [id number] Date of Birth: 1941/07/14  [facility] CM/SW Contact:  Pollie Friar, RN Phone Number: 11/30/2019, 2:22 PM   Clinical Narrative:    Pt discharging to CIR today. CM signing off.    Final next level of care: IP Rehab Facility Barriers to Discharge: No Barriers Identified   Patient Goals and CMS Choice Patient states their goals for this hospitalization and ongoing recovery are:: Pt states that she would prefer to go to CIR over SNF, but is open to options. CMS Medicare.gov Compare Post Acute Care list provided to:: Patient Choice offered to / list presented to : Patient  Discharge Placement                       Discharge Plan and Services In-house Referral: Clinical Social Work                                   Social Determinants of Health ([place]) Interventions     Readmission Risk Interventions No flowsheet data found.

## 2019-11-30 NOTE — Discharge Instructions (Signed)
Wound care instructions (heart monitor site) Keep incision clean and dry for 3 days. You can remove outer dressing tomorrow. Leave steri-strips (little pieces of tape) on until seen in the office for wound check appointment. Call the office [phone]) for redness, drainage, swelling, or fever.

## 2019-11-30 NOTE — Progress Notes (Signed)
Returned from cath lab. Dressing CDI; pt alert; no complaints.

## 2019-11-30 NOTE — Progress Notes (Signed)
Occupational Therapy Treatment Patient Details Name: Yolanda Flowers MRN: [id number] DOB: 1940-10-15 Today's Date: 11/30/2019    History of present illness Patient is a 79 year old female who presented to the hospital with increased syncope and pushing to the right. 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 She had a CABG x3 weeks prior. She had returned home and was working on progressing her mobility. PMH: hx of subdural hematoma; L eye blindness following the CABG, anxiety, CAD, bilateral LE edema.    OT comments  Patient continues to make steady progress towards goals in skilled OT session. Patient's session encompassed cotreat with PT in order to increase functional mobility, activity tolerance, and progress with ADL management. Pt requires min encouragement to participate, however once pt saw increased progress was increasingly more participatory. Pt able to ambulate increased distance in session as well as participate in ADLs at sink, however continues to require seated rest breaks after 3-5 minutes of standing or ambulating due to decreased activity tolerance. Pt remains an excellent candidate due to prior level of independence and ability to benefit from increased IADL and ADL training; will continue to follow acutely.    Follow Up Recommendations  CIR    Equipment Recommendations  3 in 1 bedside commode    Recommendations for Other Services      Precautions / Restrictions Precautions Precautions: Sternal Precaution Comments: Demonstrated appropriate sternal precautions Restrictions Weight Bearing Restrictions: No Other Position/Activity Restrictions: Sternal Precautions       Mobility Bed Mobility Overal bed mobility: Needs Assistance Bed Mobility: Supine to Sit     Supine to sit: Min guard     General bed mobility comments: Increased time and use of rail.  Transfers Overall transfer level: Needs assistance Equipment used:  Rolling walker (2 wheeled) Transfers: Sit to/from Stand Sit to Stand: Min assist         General transfer comment: pt noted to be advancing gait with increased ability in session, min cues provided for sequencing however teach strategies evident as pt was able to verbalize appropriately without cues for appropriate sequencing in sitting down    Balance Overall balance assessment: Needs assistance Sitting-balance support: Bilateral upper extremity supported Sitting balance-Leahy Scale: Fair Sitting balance - Comments: SBA while sitting    Standing balance support: Bilateral upper extremity supported Standing balance-Leahy Scale: Zero Standing balance comment: Reliant on external support bilaterally                           ADL either performed or assessed with clinical judgement   ADL Overall ADL's : Needs assistance/impaired Eating/Feeding: Set up;Sitting   Grooming: Set up;Brushing hair;Min guard;Oral care;Wash/dry face;Wash/dry hands;Standing;Sitting Grooming Details (indicate cue type and reason): Able to stand at sink to complete ADLs, one seated rest break in between (able to stand for 3-5 minutes)                 Toilet Transfer: +2 for physical assistance;+2 for safety/equipment;Stand-pivot;RW;BSC;Minimal assistance Toilet Transfer Details (indicate cue type and reason): Simulated with recliner         Functional mobility during ADLs: Moderate assistance;+2 for physical assistance;+2 for safety/equipment;Cueing for safety;Rolling walker;Minimal assistance General ADL Comments: Pt with increased gains in functional mobility and and activity tolerance, hower continues to fatigue quickly and require seated rest breaks     Vision Baseline Vision/History: Wears glasses Wears Glasses: At all times     Perception  Praxis      Cognition Arousal/Alertness: Awake/alert Behavior During Therapy: WFL for tasks assessed/performed Overall Cognitive  Status: Within Functional Limits for tasks assessed                                          Exercises     Shoulder Instructions       General Comments      Pertinent Vitals/ Pain       Pain Assessment: No/denies pain  Home Living                                          Prior Functioning/Environment              Frequency  Min 2X/week        Progress Toward Goals  OT Goals(current goals can now be found in the care plan section)  Progress towards OT goals: Progressing toward goals  Acute Rehab OT Goals Patient Stated Goal: to get back to independent living  OT Goal Formulation: With patient Time For Goal Achievement: 12/11/19 Potential to Achieve Goals: Good  Plan Discharge plan remains appropriate    Co-evaluation      Reason for Co-Treatment: Complexity of the patient's impairments (multi-system involvement);To address functional/ADL transfers;For patient/therapist safety   OT goals addressed during session: ADL's and self-care;Strengthening/ROM      AM-PAC OT "6 Clicks" Daily Activity     Outcome Measure   Help from another person eating meals?: None Help from another person taking care of personal grooming?: A Little Help from another person toileting, which includes using toliet, bedpan, or urinal?: A Lot Help from another person bathing (including washing, rinsing, drying)?: A Lot Help from another person to put on and taking off regular upper body clothing?: A Little Help from another person to put on and taking off regular lower body clothing?: A Lot 6 Click Score: 16    End of Session Equipment Utilized During Treatment: Gait belt;Rolling walker  OT Visit Diagnosis: Unsteadiness on feet (R26.81);Muscle weakness (generalized) (M62.81)   Activity Tolerance Patient tolerated treatment well   Patient Left in chair;with call bell/phone within reach;with chair alarm set   Nurse Communication Mobility  status        Time: [id number] OT Time Calculation (min): 28 min  Charges: OT General Charges $OT Visit: 1 Visit OT Treatments $Self Care/Home Management : 8-22 mins  Corinne Ports E. Addis Bennie, COTA/L Acute Rehabilitation Services [phone] [place] 11/30/2019, 11:06 AM

## 2019-11-30 NOTE — Interval H&P Note (Signed)
History and Physical Interval Note:  11/30/2019 2:41 PM  Yolanda Flowers  has presented today for surgery, with the diagnosis of stroke.  The various methods of treatment have been discussed with the patient and family. After consideration of risks, benefits and other options for treatment, the patient has consented to  Procedure(s): LOOP RECORDER INSERTION (N/A) as a surgical intervention.  The patient's history has been reviewed, patient examined, no change in status, stable for surgery.  I have reviewed the patient's chart and labs.  Questions were answered to the patient's satisfaction.     Thompson Grayer

## 2019-12-01 ENCOUNTER — Inpatient Hospital Stay (HOSPITAL_COMMUNITY): Payer: PPO | Admitting: Speech Pathology

## 2019-12-01 ENCOUNTER — Inpatient Hospital Stay (HOSPITAL_COMMUNITY): Payer: PPO | Admitting: Occupational Therapy

## 2019-12-01 ENCOUNTER — Ambulatory Visit: Payer: PPO | Admitting: Cardiology

## 2019-12-01 ENCOUNTER — Inpatient Hospital Stay (HOSPITAL_COMMUNITY): Payer: PPO | Admitting: Physical Therapy

## 2019-12-01 DIAGNOSIS — I6340 Cerebral infarction due to embolism of unspecified cerebral artery: Secondary | ICD-10-CM | POA: Diagnosis not present

## 2019-12-01 LAB — CBC WITH DIFFERENTIAL/PLATELET
Abs Immature Granulocytes: 0.6 10*3/uL — ABNORMAL HIGH (ref 0.00–0.07)
Basophils Absolute: 0.2 10*3/uL — ABNORMAL HIGH (ref 0.0–0.1)
Basophils Relative: 1 %
Eosinophils Absolute: 0.2 10*3/uL (ref 0.0–0.5)
Eosinophils Relative: 1 %
HCT: 36.4 % (ref 36.0–46.0)
Hemoglobin: 11.3 g/dL — ABNORMAL LOW (ref 12.0–15.0)
Lymphocytes Relative: 7 %
Lymphs Abs: 1.1 10*3/uL (ref 0.7–4.0)
MCH: 31.6 pg (ref 26.0–34.0)
MCHC: 31 g/dL (ref 30.0–36.0)
MCV: 101.7 fL — ABNORMAL HIGH (ref 80.0–100.0)
Metamyelocytes Relative: 3 %
Monocytes Absolute: 0.6 10*3/uL (ref 0.1–1.0)
Monocytes Relative: 4 %
Myelocytes: 1 %
Neutro Abs: 13.4 10*3/uL — ABNORMAL HIGH (ref 1.7–7.7)
Neutrophils Relative %: 83 %
Platelets: 173 10*3/uL (ref 150–400)
RBC: 3.58 MIL/uL — ABNORMAL LOW (ref 3.87–5.11)
RDW: 21.5 % — ABNORMAL HIGH (ref 11.5–15.5)
WBC: 16.2 10*3/uL — ABNORMAL HIGH (ref 4.0–10.5)
nRBC: 0 /100 WBC
nRBC: 0.1 % (ref 0.0–0.2)

## 2019-12-01 LAB — COMPREHENSIVE METABOLIC PANEL
ALT: 27 U/L (ref 0–44)
AST: 51 U/L — ABNORMAL HIGH (ref 15–41)
Albumin: 3.6 g/dL (ref 3.5–5.0)
Alkaline Phosphatase: 73 U/L (ref 38–126)
Anion gap: 11 (ref 5–15)
BUN: 19 mg/dL (ref 8–23)
CO2: 19 mmol/L — ABNORMAL LOW (ref 22–32)
Calcium: 8.9 mg/dL (ref 8.9–10.3)
Chloride: 108 mmol/L (ref 98–111)
Creatinine, Ser: 1.12 mg/dL — ABNORMAL HIGH (ref 0.44–1.00)
GFR calc Af Amer: 54 mL/min — ABNORMAL LOW (ref >60)
GFR calc non Af Amer: 47 mL/min — ABNORMAL LOW (ref >60)
Glucose, Bld: 97 mg/dL (ref 70–99)
Potassium: 5.7 mmol/L — ABNORMAL HIGH (ref 3.5–5.1)
Sodium: 138 mmol/L (ref 135–145)
Total Bilirubin: 1.2 mg/dL (ref 0.3–1.2)
Total Protein: 7 g/dL (ref 6.5–8.1)

## 2019-12-01 MED ORDER — SODIUM POLYSTYRENE SULFONATE 15 GM/60ML PO SUSP
15.0000 g | Freq: Once | ORAL | Status: AC
  Administered 2019-12-01: 15 g via ORAL
  Filled 2019-12-01: qty 60

## 2019-12-01 MED ORDER — SORBITOL 70 % SOLN: 15.0000 mL | Freq: Every day | Status: DC | PRN

## 2019-12-01 NOTE — Evaluation (Signed)
Physical Therapy Assessment and Plan  Patient Details  Name: Yolanda Flowers MRN: [id number] Date of Birth: 1940-11-24  PT Diagnosis: Abnormal posture, Abnormality of gait, Ataxia, Ataxic gait, Coordination disorder, Hemiplegia dominant and Muscle weakness Rehab Potential: Good ELOS: 10-14 days   Today's Date: 12/01/2019 PT Individual Time: 1300-1355 PT Individual Time Calculation (min): 55 min    Problem List:  Patient Active Problem List   Diagnosis Date Noted  . Embolic stroke ([place]) 81/27/5170  . Cellulitis of right leg   . Abdominal pain   . Stroke due to embolism ([place]) 11/25/2019  . S/P CABG x 4 11/04/2019  . CAD (coronary artery disease) 10/25/2019  . Accelerating angina ([place]) 08/26/2019  . Vitamin D deficiency 08/24/2019  . Combined hyperlipidemia 08/24/2019  . Body mass index 29.0-29.9, adult 08/24/2019  . Vitamin B 12 deficiency 08/24/2019  . Malignant melanoma of great toe ([place]) 08/24/2019  . Pedal edema 08/24/2019  . Skin cancer 08/24/2019  . Squamous cell carcinoma, scalp/neck 08/24/2019  . Ischial bursitis, right 08/24/2019  . Swelling of limb 08/24/2019  . Renal insufficiency 08/24/2019  . Atherosclerosis of arteries 08/24/2019  . Dyspnea 02/09/2019  . Solitary pulmonary nodule on lung CT 02/09/2019  . Gastrointestinal bleeding 06/23/2018  . VIN III (vulvar intraepithelial neoplasia III) 10/09/2015  . Melanoma in situ ([place]) 06/29/2014  . Essential thrombocytosis ([place]) 06/22/2014  . COPD (chronic obstructive pulmonary disease) ([place]) 06/21/2014  . HTN (hypertension) 06/21/2014  . Allergic rhinitis 06/21/2014  . GERD (gastroesophageal reflux disease) 06/21/2014    Past Medical History:  Past Medical History:  Diagnosis Date  . Anxiety   . Arthritis   . Asthma   . Bilateral edema of lower extremity   . Complication of anesthesia    difficulty waking  . Congestive heart failure (CHF) ([place])   . COPD (chronic obstructive pulmonary disease) ([place])   .  Coronary artery disease   . Dyspnea on exertion   . GERD (gastroesophageal reflux disease)   . History of melanoma excision    left greast toe 2015  . History of squamous cell carcinoma excision    face--  multiple excisions  . History of subdural hematoma    2008  . Hypertension   . OSA (obstructive sleep apnea)    intolerant  . Thrombocytosis ([place])    takes hydoxyurea--  MONITORED BY DR Hinton Rao [facility])  . VIN III (vulvar intraepithelial neoplasia III)   . Wears dentures    UPPER AND LOWER PARTIAL  . Wears glasses    Past Surgical History:  Past Surgical History:  Procedure Laterality Date  . ABDOMINAL HYSTERECTOMY  age 54  . CARDIAC CATHETERIZATION  03/172021  . CORONARY ARTERY BYPASS GRAFT N/A 11/04/2019   Procedure: CORONARY ARTERY BYPASS GRAFTING (CABG) x 4, with ENDOSCOPIC HARVESTING OF RIGHT GREATER SAPHENOUS VEIN.;  Surgeon: Gaye Pollack, MD;  Location: [place] OR;  Service: Open Heart Surgery;  Laterality: N/A;  . INTRAVASCULAR ULTRASOUND/IVUS N/A 11/03/2019   Procedure: Intravascular Ultrasound/IVUS;  Surgeon: Nelva Bush, MD;  Location: [place] [facility];  Service: Cardiovascular;  Laterality: N/A;  . KNEE ARTHROSCOPY Left 2004  . LEFT HEART CATH AND CORONARY ANGIOGRAPHY N/A 11/03/2019   Procedure: LEFT HEART CATH AND CORONARY ANGIOGRAPHY;  Surgeon: Nelva Bush, MD;  Location: [place] [facility];  Service: Cardiovascular;  Laterality: N/A;  . LOOP RECORDER INSERTION N/A 11/30/2019   Procedure: LOOP RECORDER INSERTION;  Surgeon: Thompson Grayer, MD;  Location: [place] [facility];  Service: Cardiovascular;  Laterality: N/A;  . MELANOMA EXCISION  2015   left great toe  . REPAIR PERONEAL TENDONS ANKLE  2004  . SUBDURAL HEMATOMA EVACUATION VIA CRANIOTOMY  2008      week later  post-op  [facility] Surgery  . TEE WITHOUT CARDIOVERSION N/A 11/04/2019   Procedure: TRANSESOPHAGEAL ECHOCARDIOGRAM (TEE);  Surgeon: Gaye Pollack, MD;  Location: [place];  Service:  Open Heart Surgery;  Laterality: N/A;  . VULVECTOMY N/A 10/24/2015   Procedure: WIDE LOCAL EXCISION OF THE VULVA ;  Surgeon: Everitt Amber, MD;  Location: [place];  Service: Gynecology;  Laterality: N/A;    Assessment & Plan Clinical Impression: Patient is a 79 year old female with history of HTN, chronic thrombocytosis, CAD s/p CABG 10/29/2019 who was admitted on 11/25/2019 with LUE weakness and visual changes.  Patient reported visual changes after surgery with progressive loss of vision in left eye and decreased vision in right eye 2 days after discharge from CABG.   CTA head/neck was negative for acute abnormality and showed moderate to marked chronic microvascular changes.  MRI brain done revealing scattered bilateral acute cerebral and cerebellar infarcts and small subacute acute on chronic left frontal SDH.  Stroke was felt to be embolic and neurology recommended DAPT x3-week as well as TEE/loop recorder for work-up.  2D echo showed EF of 65 to 70% with no wall abnormality and mild aortic sclerosis.  BLE Dopplers were negative for DVT.  Cardiology recommended embolism due to clamping of aorta or cardioembolic in nature.  She underwent loop recorder placement on 04/13 by Dr. Rayann Heman.  Patient with resultant decreased endurance, right-sided weakness and visual deficits affecting ADLs and mobility  Patient transferred to CIR on 11/30/2019 .   Patient currently requires mod with mobility secondary to muscle weakness and muscle joint tightness, decreased cardiorespiratoy endurance, ataxia, decreased coordination and decreased motor planning, field cut and decreased standing balance, decreased postural control, hemiplegia and decreased balance strategies.  Prior to hospitalization, patient was independent  with mobility and lived with Alone in a House home.  Home access is  Other (comment)(1 STE).  Patient will benefit from skilled PT intervention to maximize safe functional mobility, minimize  fall risk and decrease caregiver burden for planned discharge home with intermittent assist.  Anticipate patient will benefit from follow up [facility] at discharge.  PT - End of Session Activity Tolerance: Tolerates 10 - 20 min activity with multiple rests Endurance Deficit: Yes PT Assessment Rehab Potential (ACUTE/IP ONLY): Good PT Barriers to Discharge: Decreased caregiver support;Medical stability;Home environment access/layout PT Patient demonstrates impairments in the following area(s): Balance;Edema;Endurance;Motor;Perception;Safety;Skin Integrity PT Transfers Functional Problem(s): Bed Mobility;Bed to Chair;Car;Furniture;Floor PT Locomotion Functional Problem(s): Ambulation;Wheelchair Mobility;Stairs PT Plan PT Intensity: Minimum of 1-2 x/day ,45 to 90 minutes PT Frequency: 5 out of 7 days PT Duration Estimated Length of Stay: 10-14 days PT Treatment/Interventions: Ambulation/gait training;Discharge planning;Functional mobility training;Psychosocial support;Therapeutic Activities;Visual/perceptual remediation/compensation;Wheelchair propulsion/positioning;Therapeutic Exercise;Skin care/wound management;Neuromuscular re-education;Disease management/prevention;Balance/vestibular training;Cognitive remediation/compensation;DME/adaptive equipment instruction;Pain management;Splinting/orthotics;UE/LE Coordination activities;UE/LE Strength taining/ROM;Stair training;Patient/family education;Functional electrical stimulation;Community reintegration PT Transfers Anticipated Outcome(s): Mod I with LRAD PT Locomotion Anticipated Outcome(s): Supervision assist with LRAD at ambulatory level PT Recommendation Recommendations for Other Services: Therapeutic Recreation consult Therapeutic Recreation Interventions: Stress management Follow Up Recommendations: Home health PT Patient destination: Home Equipment Recommended: To be determined  Skilled Therapeutic Intervention Pt received supine in bed and  agreeable to PT. PT obtained WC to encourage OOB activity. Supine>sit transfer with min assist and cues for  sternal precautions. PT instructed patient in PT Evaluation and initiated treatment intervention; see below for results. PT educated patient in [place], rehab potential, rehab goals, and discharge recommendations. Pt required min-mod assist from PT for gait training without AD x 21f and 584fwith min assist overall with RW. Noted ataxia in the RLE with poor foot clearance as listed. Car transfer with mod assist for safety. Pt unable to perform stairs without UE support. With Bil rails, min-mod assist and moderate cues for step to gait pattern. 5xSTS 28 sec with BUE to push from thighs and min assist to prevent posterior LOB. [facility] returned to [facility] and [facility] and all needs met.       PT Evaluation Precautions/Restrictions Restrictions Weight Bearing Restrictions: Yes RUE Weight Bearing: Partial weight bearing RUE Partial Weight Bearing Percentage or Pounds: (5lbs or less with both upper ext) LUE Weight Bearing: Partial weight bearing LUE Partial Weight Bearing Percentage or Pounds: (less than 5LB) General   Vital SignsTherapy Vitals Temp: 98.2 F (36.8 C) Temp Source: Oral Pulse Rate: 84 Resp: 17 BP: 124/78 Patient Position (if appropriate): Sitting Oxygen Therapy SpO2: 97 % O2 Device: Room Air Pain Pain Assessment Pain Scale: 0-10 Pain Score: 0-No pain Home Living/Prior Functioning Home Living Available Help at Discharge: Family;Available 24 hours/day Type of Home: House Home Access: Other (comment)(1 STE) Home Layout: One level Bathroom Shower/Tub: Tub only;Walk-in shower Bathroom Toilet: Handicapped height Additional Comments: Patients daughter in law helps at home. niece will be coming to stay with her at discharge.  Lives With: Alone Prior Function Level of Independence: Requires assistive device for independence  Able to Take  Stairs?: Yes Vocation: Retired Comments: used a cane. Prior to CABG she was independent and mowing the lawn on her own Vision/Perception  Vision - Assessment Eye Alignment: Within Functional Limits Ocular Range of Motion: Within Functional Limits Alignment/Gaze Preference: Within Defined Limits Tracking/Visual Pursuits: Decreased smoothness of eye movement to LEFT superior field Perception Perception: Within Functional Limits Praxis Praxis: Intact  Cognition Overall Cognitive Status: Impaired/Different from baseline Arousal/Alertness: Awake/alert Orientation Level: Oriented X4 Attention: Sustained Sustained Attention: Appears intact Memory: Impaired Memory Impairment: Decreased short term memory Decreased Short Term Memory: Verbal basic;Functional complex Awareness: Impaired Awareness Impairment: Anticipatory impairment Problem Solving: Appears intact Executive Function: Initiating;Sequencing;Decision Making Sequencing: Appears intact Decision Making: Appears intact Initiating: Appears intact Safety/Judgment: Appears intact Sensation Sensation Light Touch: Appears Intact Proprioception: Appears Intact Coordination Gross Motor Movements are Fluid and Coordinated: No Coordination and Movement Description: mild ataxia in the RLE Heel Shin Test: mild ataxia RLE Motor  Motor Motor: Hemiplegia;Ataxia Motor - Skilled Clinical Observations: R side ataxia and weakness  Mobility Bed Mobility Bed Mobility: Rolling Right;Rolling Left;Sit to Supine;Supine to Sit Rolling Right: Minimal Assistance - Patient > 75% Rolling Left: Minimal Assistance - Patient > 75% Supine to Sit: Minimal Assistance - Patient > 75% Sit to Supine: Minimal Assistance - Patient > 75% Transfers Transfers: Sit to StBank of Americaransfers Sit to Stand: Moderate Assistance - Patient 50-74% Stand Pivot Transfers: Moderate Assistance - Patient 50 - 74% Transfer (Assistive device): None Locomotion   Gait Ambulation: Yes Gait Distance (Feet): 15 Feet Assistive device: None Gait Gait: Yes Gait Pattern: Impaired Gait Pattern: Poor foot clearance - right;Ataxic;Lateral hip instability Stairs / Additional Locomotion Stairs: No Wheelchair Mobility Wheelchair Mobility: No(sternal precuations.)  Trunk/Postural Assessment  Cervical Assessment Cervical Assessment: Exceptions to WFL(rounded shoulders.) Thoracic Assessment Thoracic Assessment: Exceptions to [facility]umbar Assessment Lumbar  Assessment: Exceptions to [facility] Postural Control Postural Control: Deficits on evaluation(dealyed in standing poor anterior/posterior control)  Balance Balance Balance Assessed: Yes Static Sitting Balance Static Sitting - Level of Assistance: 5: Stand by assistance Dynamic Sitting Balance Dynamic Sitting - Level of Assistance: 5: Stand by assistance Static Standing Balance Static Standing - Balance Support: No upper extremity supported Static Standing - Level of Assistance: 4: Min assist Dynamic Standing Balance Dynamic Standing - Balance Support: During functional activity;No upper extremity supported Dynamic Standing - Level of Assistance: 3: Mod assist Extremity Assessment      RLE Assessment RLE Assessment: Within Functional Limits General Strength Comments: 4/5 with mild ataxia LLE Assessment LLE Assessment: Within Functional Limits General Strength Comments: grossly 4+/5    Refer to Care Plan for Long Term Goals  Recommendations for other services: Therapeutic Recreation  Stress management and Outing/community reintegration  Discharge Criteria: Patient will be discharged from PT if patient refuses treatment 3 consecutive times without medical reason, if treatment goals not met, if there is a change in medical status, if patient makes no progress towards goals or if patient is discharged from hospital.  The above assessment, treatment plan, treatment alternatives and goals were  discussed and mutually agreed upon: by patient  Lorie Phenix 12/01/2019, 2:40 PM

## 2019-12-01 NOTE — Progress Notes (Signed)
PMR Admission Coordinator Pre-Admission Assessment   Patient: Yolanda Flowers is an 79 y.o., female MRN: [id number] DOB: 27-Aug-1940 Height: '5\' 6"'  (167.6 cm) Weight: 73.5 kg                                                                                                                                                  Insurance Information HMO:    PPO:  Yes     PCP: Yolanda Johns, MD     IPA:     80/20:      OTHER:  PRIMARY: [employer]       Policy#: [id number]      Subscriber: patient CM Name: [name]       Phone#: [phone]     Fax#: [phone] Pre-Cert#: [id number] for 7 days      Employer: n/a Benefits:  Phone #:      Name: [name]: 08/20/2019     Deduct: $0      Out of Pocket Max: $3,400 ($0 met)       CIR: $295 Co-pay days 1-6, $0/day 7-90      SNF: $0 co-pay days 0-20, $178/day days 21-100, limited to 100 days per benefit period Outpatient: $15/visit, limited by medical necessity     Co-Pay: $15/visit Home Health: 100% coverage     Co-Pay: 0%+ DME: 80% coverage     Co-Pay: 20% Providers: Preferred network  SECONDARY:  Medicaid Application Date:       Case Manager:  Disability Application Date:       Case Worker:    The "Data Collection Information Summary" for patients in Inpatient Rehabilitation Facilities with attached "Privacy Act [place] Records" was provided and verbally reviewed with: Patient and Family   Emergency Contact Information         Contact Information     Name Relation Home Work Mobile    Yolanda Flowers Daughter [phone]           Current Medical History  Patient Admitting Diagnosis: Stroke  History of Present Illness:   TIARE Yolanda Flowers is a 79 y.o. female with history of HTN, thrombocytosis, SDH, COPD, CAD s/p CABG 10/29/19 who was admitted on 11/25/19 with BLE weakness and LUE weakness. CTA head/neck was negative for acute abnormality or LVO and showed moderate to marked chronic microvascular changes. MRI brain done revealing  scattered bilateral acute cerebral and cerebellar infarct and small subacute on chronic small left frontal SDH.  Stroke felt to be embolic loop recorder to be placed on 4/13,  as DAPT X 3 weeks.  2D echo showed EF 65-70% with no wall abnormality and mild aortic sclerosis. BLE dopplers were negative for DVT. Pt. With LE wound cellulitis at SVG site, now resolved following course of doxycycline.    Complete NIHSS TOTAL: 1 Glasgow Coma Scale Score: 15   Past  Medical History      Past Medical History:  Diagnosis Date  . Anxiety    . Arthritis    . Asthma    . Bilateral edema of lower extremity    . Complication of anesthesia      difficulty waking  . Congestive heart failure (CHF) ([place])    . COPD (chronic obstructive pulmonary disease) ([place])    . Coronary artery disease    . Dyspnea on exertion    . GERD (gastroesophageal reflux disease)    . History of melanoma excision      left greast toe 2015  . History of squamous cell carcinoma excision      face--  multiple excisions  . History of subdural hematoma      2008  . Hypertension    . OSA (obstructive sleep apnea)      intolerant  . Thrombocytosis ([place])      takes hydoxyurea--  MONITORED BY DR Hinton Rao [facility])  . VIN III (vulvar intraepithelial neoplasia III)    . Wears dentures      UPPER AND LOWER PARTIAL  . Wears glasses        Family History  family history includes Arthritis in her brother, father, mother, and sister; Asthma in her father; Clotting disorder in her mother; Emphysema in her father; Heart disease in her brother, father, mother, and sister; Hypertension in her brother, father, mother, and sister; Kidney failure in her father; Stroke in her father.   Prior Rehab/Hospitalizations:  Has the patient had prior rehab or hospitalizations prior to admission? Yes   Has the patient had major surgery during 100 days prior to admission? Yes   Current Medications    Current Facility-Administered  Medications:  .  acetaminophen (TYLENOL) tablet 650 mg, 650 mg, Oral, Q6H PRN **OR** acetaminophen (TYLENOL) suppository 650 mg, 650 mg, Rectal, Q6H PRN, Autry-Lott, Simone, DO .  alum & mag hydroxide-simeth (MAALOX/MYLANTA) 200-200-20 MG/5ML suspension 30 mL, 30 mL, Oral, Q6H PRN, Eniola, Kehinde T, MD .  aspirin EC tablet 81 mg, 81 mg, Oral, Daily, Eniola, Kehinde T, MD, 81 mg at 11/30/19 1106 .  atorvastatin (LIPITOR) tablet 80 mg, 80 mg, Oral, q1800, Autry-Lott, Simone, DO, 80 mg at 11/29/19 1729 .  [COMPLETED] clopidogrel (PLAVIX) tablet 300 mg, 300 mg, Oral, Once, 300 mg at 11/25/19 1712 **FOLLOWED BY** clopidogrel (PLAVIX) tablet 75 mg, 75 mg, Oral, Daily, Autry-Lott, Simone, DO, 75 mg at 11/30/19 1106 .  enoxaparin (LOVENOX) injection 40 mg, 40 mg, Subcutaneous, Q24H, Mullis, Kiersten P, DO, 40 mg at 11/29/19 1354 .  ferrous [id number] C-folic acid (TRINSICON / FOLTRIN) capsule 1 capsule, 1 capsule, Oral, BID PC, Autry-Lott, Simone, DO, 1 capsule at 11/30/19 0825 .  hydroxyurea (HYDREA) capsule 500 mg, 500 mg, Oral, Daily, Autry-Lott, Simone, DO, 500 mg at 11/30/19 1106 .  metoprolol tartrate (LOPRESSOR) tablet 25 mg, 25 mg, Oral, BID, Beard, Samantha N, DO, 25 mg at 11/30/19 1106 .  pantoprazole (PROTONIX) EC tablet 40 mg, 40 mg, Oral, Daily, Autry-Lott, Simone, DO, 40 mg at 11/30/19 1106 .  polyethylene glycol (MIRALAX / GLYCOLAX) packet 17 g, 17 g, Oral, Daily PRN, Autry-Lott, Simone, DO   Patients Current Diet:     Diet Order                      Diet Heart Healthy Regular Texture Room service appropriate? Yes; Fluid consistency: Thin  Diet effective now  Precautions / Restrictions Precautions Precautions: Sternal Precaution Comments: Demonstrated appropriate sternal precautions Restrictions Weight Bearing Restrictions: No Other Position/Activity Restrictions: Sternal Precautions    Has the patient had 2 or more falls or a fall with injury in  the past year?No   Prior Activity Level Household: Pt. was independent prior to CABG in March 2021. Family has been helping with household tasks since surgery.  Limited Community (1-2x/wk): Pt. reports leaving home 1-2x a week    Prior Functional Level Prior Function Level of Independence: Independent with assistive device(s) Comments: used a cane. Ptior to CABG she was independent and mowing the lawn on her own    Self Care: Did the patient need help bathing, dressing, using the toilet or eating?  Independent   Indoor Mobility: Did the patient need assistance with walking from room to room (with or without device)? Independent   Stairs: Did the patient need assistance with internal or external stairs (with or without device)? Independent   Functional Cognition: Did the patient need help planning regular tasks such as shopping or remembering to take medications? Independent   Home Assistive Devices / Equipment Home Assistive Devices/Equipment: Eyeglasses Home Equipment: Cane - single point   Prior Device Use: Indicate devices/aids used by the patient prior to current illness, exacerbation or injury? cane   Current Functional Level Cognition   Arousal/Alertness: Awake/alert Overall Cognitive Status: Within Functional Limits for tasks assessed Orientation Level: Oriented X4 General Comments: AOX3  Memory: Appears intact Awareness: Appears intact Problem Solving: Appears intact Safety/Judgment: Appears intact    Extremity Assessment (includes Sensation/Coordination)   Upper Extremity Assessment: Generalized weakness, RUE deficits/detail, LUE deficits/detail RUE Deficits / Details: strength 3-/5 MM grade, 3/5 hand squeeze LUE Deficits / Details: strength 3+/5 MM grade, 4/5 hand squeeze  Lower Extremity Assessment: Generalized weakness RLE Deficits / Details: R hip flexion and abdcution weaker compared to left      ADLs   Overall ADL's : Needs  assistance/impaired Eating/Feeding: Set up, Sitting Grooming: Set up, Brushing hair, Min guard, Oral care, Wash/dry face, Wash/dry hands, Standing, Sitting Grooming Details (indicate cue type and reason): Able to stand at sink to complete ADLs, one seated rest break in between (able to stand for 3-5 minutes) Upper Body Bathing: Set up, Sitting Lower Body Bathing: Moderate assistance, +2 for physical assistance, +2 for safety/equipment, Sitting/lateral leans, Sit to/from stand Upper Body Dressing : Minimal assistance, Sitting Lower Body Dressing: Moderate assistance, +2 for physical assistance, +2 for safety/equipment, Sitting/lateral leans, Sit to/from stand Toilet Transfer: +2 for physical assistance, +2 for safety/equipment, Stand-pivot, RW, BSC, Minimal assistance Toilet Transfer Details (indicate cue type and reason): Simulated with recliner Toileting- Clothing Manipulation and Hygiene: Moderate assistance, +2 for physical assistance, +2 for safety/equipment, Cueing for safety, Cueing for sequencing, Sitting/lateral lean, Sit to/from stand Functional mobility during ADLs: Moderate assistance, +2 for physical assistance, +2 for safety/equipment, Cueing for safety, Rolling walker, Minimal assistance General ADL Comments: Pt with increased gains in functional mobility and and activity tolerance, hower continues to fatigue quickly and require seated rest breaks     Mobility   Overal bed mobility: Needs Assistance Bed Mobility: Supine to Sit Supine to sit: Min guard Sit to supine: Min guard General bed mobility comments: Increased time and use of rail.     Transfers   Overall transfer level: Needs assistance Equipment used: Rolling walker (2 wheeled) Transfers: Sit to/from Stand Sit to Stand: Min assist Stand pivot transfers: Mod assist, +2 physical assistance, +2 safety/equipment General transfer  comment: assist to power up and to steady; cues for safe hand placement     Ambulation /  Gait / Stairs / Wheelchair Mobility   Ambulation/Gait Ambulation/Gait assistance: Min assist, +2 safety/equipment, +2 physical assistance Gait Distance (Feet): (~50 ft total (RW and HHA +2)) Assistive device: Rolling walker (2 wheeled), 2 person hand held assist Gait Pattern/deviations: Step-to pattern, Step-through pattern, Decreased step length - right, Decreased step length - left, Decreased dorsiflexion - right, Trunk flexed, Steppage General Gait Details: steppage gait R LE; cues for sequencing, upright posture, and use of AD; assistance required for balance; step through pattern with HHA and step to with RW Gait velocity: decreased     Posture / Balance Dynamic Sitting Balance Sitting balance - Comments: SBA while sitting  Balance Overall balance assessment: Needs assistance Sitting-balance support: Bilateral upper extremity supported Sitting balance-Leahy Scale: Fair Sitting balance - Comments: SBA while sitting  Standing balance support: Bilateral upper extremity supported Standing balance-Leahy Scale: Poor Standing balance comment: Reliant on external support bilaterally     Special needs/care consideration BiPAP/CPAP No CPM: No Continuous Drip IV: No Dialysis: No      Life Vest no Oxygen: Room air Special Bed: No Trach Size: none Wound Vac (area): No    Skin: Cellulitis                        Location: RLE Bowel mgmt:No  Bladder mgmt:No Diabetic mgmt: No Behavioral consideration None Chemo/radiation None Visitors to be [name] and Tim Loop recorder placed 4/13      Previous Home Environment  Living Arrangements: Alone  Lives With: Spouse Available Help at Discharge: Family Type of Home: House Home Layout: One level Home Access: Other (comment)(single step to enter) Bathroom Shower/Tub: Tub only, Multimedia programmer: Handicapped height [place]: No Additional Comments: Patients daughter in law helps at home    Discharge Living  Setting Plans for Discharge Living Setting: Patient's home Type of Home at Discharge: House Discharge Home Layout: One level Discharge Home Access: Other (comment)(single step to enter) Discharge Bathroom Shower/Tub: Tub only, Walk-in shower Discharge Bathroom Toilet: Handicapped height Does the patient have any problems obtaining your medications?: No   Social/Family/Support Systems Patient Roles: Other (Comment)(Daughter in Law and/or Niece) Pt.'s son states that family can only provide short-term supervision and assistance following discharge, likely for less than 1 month.  Contact Information: Daughter in [place], [place]  Anticipated Caregiver: Hattie Pine Anticipated Caregiver's Contact Information: [phone] Ability/Limitations of Caregiver: Has back problems Caregiver Availability: 24/7(short term) Discharge Plan Discussed with Primary Caregiver: Yes Is Caregiver In Agreement with Plan?: Yes   Goals/Additional Needs Patient/Family Goal for Rehab: PT/OT min assist; SLP mod I (family wish for patient to be mod I with OT/PT/ST Expected length of stay: 17-20 days Equipment Needs: 4W rolling walker with 5" wheels Pt/Family Agrees to Admission and willing to participate: Yes Program Orientation Provided & Reviewed with Pt/Caregiver Including Roles  & Responsibilities: Yes   Decrease burden of Care through IP rehab admission: Specialzed equipment needs, Decrease number of caregivers and Patient/family education   Possible need for SNF placement upon discharge: Not anticipated   Patient Condition: This patient's condition remains as documented in the consult dated 11/27/2019, in which the Rehabilitation Physician determined and documented that the patient's condition is appropriate for intensive rehabilitative care in an inpatient rehabilitation facility. Will admit to inpatient rehab today   Preadmission Screen Completed By: Clemens Catholic , with  brief updates by  Cleatrice Burke, RN, 11/30/2019 2:25 PM ______________________________________________________________________   Discussed status with Dr. Dagoberto Ligas on 11/30/2019 at  1430 and received approval for admission today.   Admission Coordinator: Clemens Catholic with updates by  Cleatrice Burke, time 1430 Date 11/30/2019         Cosigned by: Courtney Heys, MD at 11/30/2019  3:59 PM

## 2019-12-01 NOTE — Progress Notes (Signed)
Verbal order given by Linna Hoff, PA to hold AM dose of Lovenox.

## 2019-12-01 NOTE — Evaluation (Signed)
Speech Language Pathology Assessment and Plan  Patient Details  Name: Yolanda Flowers MRN: [id number] Date of Birth: 1940/10/26  SLP Diagnosis: Cognitive Impairments  Rehab Potential: Excellent ELOS: 10-14 days    Today's Date: 12/01/2019 SLP Individual Time: 3567-0141 SLP Individual Time Calculation (min): 55 min   Problem List:  Patient Active Problem List   Diagnosis Date Noted  . Embolic stroke ([place]) 10/17/3141  . Cellulitis of right leg   . Abdominal pain   . Stroke due to embolism ([place]) 11/25/2019  . S/P CABG x 4 11/04/2019  . CAD (coronary artery disease) 10/25/2019  . Accelerating angina ([place]) 08/26/2019  . Vitamin D deficiency 08/24/2019  . Combined hyperlipidemia 08/24/2019  . Body mass index 29.0-29.9, adult 08/24/2019  . Vitamin B 12 deficiency 08/24/2019  . Malignant melanoma of great toe ([place]) 08/24/2019  . Pedal edema 08/24/2019  . Skin cancer 08/24/2019  . Squamous cell carcinoma, scalp/neck 08/24/2019  . Ischial bursitis, right 08/24/2019  . Swelling of limb 08/24/2019  . Renal insufficiency 08/24/2019  . Atherosclerosis of arteries 08/24/2019  . Dyspnea 02/09/2019  . Solitary pulmonary nodule on lung CT 02/09/2019  . Gastrointestinal bleeding 06/23/2018  . VIN III (vulvar intraepithelial neoplasia III) 10/09/2015  . Melanoma in situ ([place]) 06/29/2014  . Essential thrombocytosis ([place]) 06/22/2014  . COPD (chronic obstructive pulmonary disease) ([place]) 06/21/2014  . HTN (hypertension) 06/21/2014  . Allergic rhinitis 06/21/2014  . GERD (gastroesophageal reflux disease) 06/21/2014   Past Medical History:  Past Medical History:  Diagnosis Date  . Anxiety   . Arthritis   . Asthma   . Bilateral edema of lower extremity   . Complication of anesthesia    difficulty waking  . Congestive heart failure (CHF) ([place])   . COPD (chronic obstructive pulmonary disease) ([place])   . Coronary artery disease   . Dyspnea on exertion   . GERD (gastroesophageal reflux  disease)   . History of melanoma excision    left greast toe 2015  . History of squamous cell carcinoma excision    face--  multiple excisions  . History of subdural hematoma    2008  . Hypertension   . OSA (obstructive sleep apnea)    intolerant  . Thrombocytosis ([place])    takes hydoxyurea--  MONITORED BY DR Hinton Rao [facility])  . VIN III (vulvar intraepithelial neoplasia III)   . Wears dentures    UPPER AND LOWER PARTIAL  . Wears glasses    Past Surgical History:  Past Surgical History:  Procedure Laterality Date  . ABDOMINAL HYSTERECTOMY  age 18  . CARDIAC CATHETERIZATION  03/172021  . CORONARY ARTERY BYPASS GRAFT N/A 11/04/2019   Procedure: CORONARY ARTERY BYPASS GRAFTING (CABG) x 4, with ENDOSCOPIC HARVESTING OF RIGHT GREATER SAPHENOUS VEIN.;  Surgeon: Gaye Pollack, MD;  Location: [place] OR;  Service: Open Heart Surgery;  Laterality: N/A;  . INTRAVASCULAR ULTRASOUND/IVUS N/A 11/03/2019   Procedure: Intravascular Ultrasound/IVUS;  Surgeon: Nelva Bush, MD;  Location: [place] CV LAB;  Service: Cardiovascular;  Laterality: N/A;  . KNEE ARTHROSCOPY Left 2004  . LEFT HEART CATH AND CORONARY ANGIOGRAPHY N/A 11/03/2019   Procedure: LEFT HEART CATH AND CORONARY ANGIOGRAPHY;  Surgeon: Nelva Bush, MD;  Location: [place] CV LAB;  Service: Cardiovascular;  Laterality: N/A;  . LOOP RECORDER INSERTION N/A 11/30/2019   Procedure: LOOP RECORDER INSERTION;  Surgeon: Thompson Grayer, MD;  Location: [place] CV LAB;  Service: Cardiovascular;  Laterality: N/A;  . MELANOMA EXCISION  2015  left great toe  . REPAIR PERONEAL TENDONS ANKLE  2004  . SUBDURAL HEMATOMA EVACUATION VIA CRANIOTOMY  2008      week later  post-op  [facility] Surgery  . TEE WITHOUT CARDIOVERSION N/A 11/04/2019   Procedure: TRANSESOPHAGEAL ECHOCARDIOGRAM (TEE);  Surgeon: Gaye Pollack, MD;  Location: [facility];  Service: Open Heart Surgery;  Laterality: N/A;  . VULVECTOMY N/A 10/24/2015   Procedure:  WIDE LOCAL EXCISION OF THE VULVA ;  Surgeon: Everitt Amber, MD;  Location: [place];  Service: Gynecology;  Laterality: N/A;    Assessment / Plan / Recommendation Clinical Impression   HPI:  Yolanda Flowers is a 79 year old female with history of HTN, chronic thrombocytosis, CAD s/p CABG 10/29/2019 who was admitted on 11/25/2019 with LUE weakness and visual changes.  Patient reported visual changes after surgery with progressive loss of vision in left eye and decreased vision in right eye 2 days after discharge from CABG.   CTA head/neck was negative for acute abnormality and showed moderate to marked chronic microvascular changes.  MRI brain done revealing scattered bilateral acute cerebral and cerebellar infarcts and small subacute acute on chronic left frontal SDH.  Stroke was felt to be embolic and neurology recommended DAPT x3-week as well as TEE/loop recorder for work-up.  2D echo showed EF of 65 to 70% with no wall abnormality and mild aortic sclerosis.  BLE Dopplers were negative for DVT.  Cardiology recommended embolism due to clamping of aorta or cardioembolic in nature.  She underwent loop recorder placement on 04/13 by Dr. Rayann Heman.  Patient with resultant decreased endurance, right-sided weakness and visual deficits affecting ADLs and mobility.  Pt was admitted to CIR 11/30/19 and SLP evaluations were completed 12/01/19 with results as follows:  Bedside swallow evaluation: Due to history of dysphagia and recommendation for follow up education following MBSS which was completed on 11/29/19, bedside swallow was completed. Pt observed with immediate cough following consumption of mixed consistency PO (pill with water), otherwise no overt s/sx aspiration observed throughout intake of regular solids or thin liquids. Pt's mastication was timely and efficient, no oral residue or pocketing observed. Education provided regarding extra caution with mixed consistencies and to administer medications  whole in puree if coughing becomes consistent during medication administration. Also discussed recommendation to intermittently clear throat during meals, which was recommended on MBSS to clear unsensed penetrates. Recommend pt continue current regular/thin diet and given that education is complete, no follow up for dysphagia is indicated at this time.  Cognitive-linguistic evaluation: Although pt's score of 3/10 on Cerebellar Cognitive Affective / Schmahmann Syndrome Scale (CCAS-Scale) is indicative of more severe cognitive impairments and "definite CCAS"), impact of current deficits appear to have a more mild impact during functional tasks. Although she recalled having swallowing assessment orior to arrival at [facility], decreased recall of details regarding her swallow function and previously completed education. During semantic and phonemic fluency tasks, her expressive receptive language skilled determined to be [facility] and 100% intelligible, however she did perform poorly on these tasks due to decreased ability to identify a strategy to improve performance and also demonstrated decreased working memory during task. Although pt able to voice that she is concerned regarding potential loss of independence due to current deficits, she required cueing in order to speak more specifically about physical deficits and was frustrated with being unable to walk to the bathroom by herself, requiring cueing to rationalize safety concern and fall risk. Given that pt was independent  prior to admission and she and family wish for her to reach Mod I status for cognition, pt would certainly benefit from skilled interventions to address mild cognitive deficits.  Recommend pt receive skilled ST to address abovementioned cognitive deficits in order to maximize safety and functional independence at discharge.    Skilled Therapeutic Interventions          Bedside swallow and cognitive-linguistic evaluations were administered and results  were reviewed with pt (please see above for details).   SLP Assessment  Patient will need skilled Speech Lanaguage Pathology Services during CIR admission    Recommendations  SLP Diet Recommendations: Thin;Age appropriate regular solids Liquid Administration via: Cup;Straw Medication Administration: Whole meds with liquid(or whole in puree based on pt preference) Supervision: Patient able to self feed Compensations: Slow rate;Small sips/bites;Clear throat intermittently Postural Changes and/or Swallow Maneuvers: Seated upright 90 degrees;Upright 30-60 min after meal Oral Care Recommendations: Oral care BID Patient destination: Home Follow up Recommendations: 24 hour supervision/assistance(anticipate pt will meet not require follow up ST at discharge) Equipment Recommended: None recommended by SLP    SLP Frequency 3 to 5 out of 7 days   SLP Duration  SLP Intensity  SLP Treatment/Interventions 10-14 days  Minumum of 1-2 x/day, 30 to 90 minutes  Cognitive remediation/compensation;Cueing hierarchy;Functional tasks;Patient/family education;Internal/external aids    Pain Pain Assessment Pain Scale: 0-10 Pain Score: 0-No pain  Prior Functioning Type of Home: House  Lives With: Alone Available Help at Discharge: Family;Available 24 hours/day Vocation: Retired  Programmer, systems Overall Cognitive Status: Impaired/Different from baseline Arousal/Alertness: Awake/alert Orientation Level: Oriented X4 Attention: Sustained Sustained Attention: Appears intact Memory: Impaired Memory Impairment: Decreased short term memory Decreased Short Term Memory: Verbal basic;Functional complex Immediate Memory Recall: Sock;Blue;Bed Memory Recall Sock: Without Cue Memory Recall Blue: Without Cue Memory Recall Bed: Without Cue Awareness: Impaired Awareness Impairment: Anticipatory impairment Problem Solving: Appears intact Executive Function: Initiating;Sequencing;Decision  Making Sequencing: Appears intact Decision Making: Appears intact Initiating: Appears intact Safety/Judgment: Appears intact  Comprehension Auditory Comprehension Overall Auditory Comprehension: Appears within functional limits for tasks assessed Yes/No Questions: Within Functional Limits Commands: Within Functional Limits Conversation: Complex Visual Recognition/Discrimination Discrimination: Not tested Reading Comprehension Reading Status: Within funtional limits Expression Expression Primary Mode of Expression: Verbal Verbal Expression Overall Verbal Expression: Appears within functional limits for tasks assessed Initiation: No impairment Level of Generative/Spontaneous Verbalization: Conversation Repetition: No impairment Naming: No impairment Pragmatics: No impairment Non-Verbal Means of Communication: Not applicable Written Expression Dominant Hand: Right Written Expression: Not tested Oral Motor Oral Motor/Sensory Function Overall Oral Motor/Sensory Function: Mild impairment Facial ROM: Within Functional Limits Facial Symmetry: Abnormal symmetry left Facial Strength: Within Functional Limits Lingual Symmetry: Within Functional Limits Velum: Within Functional Limits Mandible: Within Functional Limits Motor Speech Overall Motor Speech: Appears within functional limits for tasks assessed Respiration: Within functional limits Phonation: Normal Resonance: Within functional limits Articulation: Within functional limitis Intelligibility: Intelligible Motor Planning: Witnin functional limits Motor Speech Errors: Not applicable   Intelligibility: Intelligible  Bedside Swallowing Assessment General Date of Onset: 11/25/19 Previous Swallow Assessment: MBSS 11/29/19 Diet Prior to this Study: Regular;Thin liquids Temperature Spikes Noted: N/A Respiratory Status: Room air History of Recent Intubation: No Behavior/Cognition: Alert;Cooperative;Pleasant mood Oral  Cavity - Dentition: Adequate natural dentition Self-Feeding Abilities: Able to feed self Vision: Functional for self-feeding Patient Positioning: Upright in bed Baseline Vocal Quality: Normal Volitional Cough: Strong Volitional Swallow: Able to elicit  Oral Care Assessment Does patient have any of the following "high(er) risk" factors?: None of the  above Does patient have any of the following "at risk" factors?: None of the above Patient is LOW RISK: Follow universal precautions (see row information) Ice Chips Ice chips: Not tested Thin Liquid Thin Liquid: Within functional limits Presentation: Cup;Straw Nectar Thick Nectar Thick Liquid: Not tested Honey Thick Honey Thick Liquid: Not tested Puree Puree: Within functional limits Presentation: Spoon Solid Solid: Within functional limits Presentation: Self Fed BSE Assessment Suspected Esophageal Findings Suspected Esophageal Findings: Belching Risk for Aspiration Impact on safety and function: Mild aspiration risk Other Related Risk Factors: History of GERD;History of esophageal-related issues;Previous CVA  Short Term Goals: Week 1: SLP Short Term Goal 1 (Week 1): Pt will demonstate recall of new and daily information with Supervision A cues for use of aids or strategies for short term recall. SLP Short Term Goal 2 (Week 1): Pt will demonstate anticipatory awareness by identifying 2 tasks/activities she will require assistance with at home with Supervision A verbal cues. SLP Short Term Goal 3 (Week 1): Pt will demonstrate ability to problem solving mildly complex to complex functional situations with Supervision A verbal/visual cues.  Refer to Care Plan for Long Term Goals  Recommendations for other services: None   Discharge Criteria: Patient will be discharged from SLP if patient refuses treatment 3 consecutive times without medical reason, if treatment goals not met, if there is a change in medical status, if patient makes  no progress towards goals or if patient is discharged from hospital.  The above assessment, treatment plan, treatment alternatives and goals were discussed and mutually agreed upon: by patient  Arbutus Leas 12/01/2019, 11:48 AM

## 2019-12-01 NOTE — Progress Notes (Signed)
Pt notified staff that she had blood on her gown. Loop Recorder site dressing was saturated with blood and leaking from the tegaderm. Area cleaned, Gauze applied, and clean tegaderm placed over the site.

## 2019-12-01 NOTE — Care Management (Signed)
Inpatient [facility] Individual Statement of Services  Patient Name:  Yolanda Flowers  Date:  12/01/2019  Welcome to the [facility].  Our goal is to provide you with an individualized program based on your diagnosis and situation, designed to meet your specific needs.  With this comprehensive rehabilitation program, you will be expected to participate in at least 3 hours of rehabilitation therapies Monday-Friday, with modified therapy programming on the weekends.  Your rehabilitation program will include the following services:  Physical Therapy (PT), Occupational Therapy (OT), Speech Therapy (ST), 24 hour per day rehabilitation nursing, Therapeutic Recreations (TR), Neuropsychology, Case Management (Social Worker), Rehabilitation Medicine, Nutrition Services and Pharmacy Services  Weekly team conferences will be held on Wednesdays to discuss your progress.  Your Social Industrial/product designer will talk with you frequently to get your input and to update you on team discussions.  Team conferences with you and your family in attendance may also be held.  Expected length of stay: 10-14 days  Overall anticipated outcome: Supervision  Depending on your progress and recovery, your program may change. Your Social Industrial/product designer will coordinate services and will keep you informed of any changes. Your Social Worker's/Care Manger's name and contact numbers are listed  below.  The following services may also be recommended but are not provided by the [facility]:    [place] will be made to provide these services after discharge if needed.  Arrangements include referral to agencies that provide these services.  Your insurance has been verified to be:  [employer] Your primary doctor is:  [name], MD  Pertinent information will be shared with your doctor and  your insurance company.  Care Manager/Social Worker: Dorien Chihuahua, RN [phone] or [phone]  Information discussed with and copy given to patient by: Margarito Liner, 12/01/2019, 5:17 PM

## 2019-12-01 NOTE — Care Management (Signed)
Patient Details  Name: Yolanda Flowers MRN: [id number] Date of Birth: [birth year]  Today's Date: 12/01/2019  Problem List:  Patient Active Problem List   Diagnosis Date Noted  . Embolic stroke ([place]) 99991111  . Cellulitis of right leg   . Abdominal pain   . Stroke due to embolism ([place]) 11/25/2019  . S/P CABG x 4 11/04/2019  . CAD (coronary artery disease) 10/25/2019  . Accelerating angina ([place]) 08/26/2019  . Vitamin D deficiency 08/24/2019  . Combined hyperlipidemia 08/24/2019  . Body mass index 29.0-29.9, adult 08/24/2019  . Vitamin B 12 deficiency 08/24/2019  . Malignant melanoma of great toe ([place]) 08/24/2019  . Pedal edema 08/24/2019  . Skin cancer 08/24/2019  . Squamous cell carcinoma, scalp/neck 08/24/2019  . Ischial bursitis, right 08/24/2019  . Swelling of limb 08/24/2019  . Renal insufficiency 08/24/2019  . Atherosclerosis of arteries 08/24/2019  . Dyspnea 02/09/2019  . Solitary pulmonary nodule on lung CT 02/09/2019  . Gastrointestinal bleeding 06/23/2018  . VIN III (vulvar intraepithelial neoplasia III) 10/09/2015  . Melanoma in situ ([place]) 06/29/2014  . Essential thrombocytosis ([place]) 06/22/2014  . COPD (chronic obstructive pulmonary disease) ([place]) 06/21/2014  . HTN (hypertension) 06/21/2014  . Allergic rhinitis 06/21/2014  . GERD (gastroesophageal reflux disease) 06/21/2014   Past Medical History:  Past Medical History:  Diagnosis Date  . Anxiety   . Arthritis   . Asthma   . Bilateral edema of lower extremity   . Complication of anesthesia    difficulty waking  . Congestive heart failure (CHF) ([place])   . COPD (chronic obstructive pulmonary disease) ([place])   . Coronary artery disease   . Dyspnea on exertion   . GERD (gastroesophageal reflux disease)   . History of melanoma excision    left greast toe 2015  . History of squamous cell carcinoma excision    face--  multiple excisions  . History of subdural hematoma    2008  . Hypertension   . OSA  (obstructive sleep apnea)    intolerant  . Thrombocytosis ([place])    takes hydoxyurea--  MONITORED BY DR Hinton Rao [facility])  . VIN III (vulvar intraepithelial neoplasia III)   . Wears dentures    UPPER AND LOWER PARTIAL  . Wears glasses    Past Surgical History:  Past Surgical History:  Procedure Laterality Date  . ABDOMINAL HYSTERECTOMY  age 71  . CARDIAC CATHETERIZATION  03/172021  . CORONARY ARTERY BYPASS GRAFT N/A 11/04/2019   Procedure: CORONARY ARTERY BYPASS GRAFTING (CABG) x 4, with ENDOSCOPIC HARVESTING OF RIGHT GREATER SAPHENOUS VEIN.;  Surgeon: Gaye Pollack, MD;  Location: [place] OR;  Service: Open Heart Surgery;  Laterality: N/A;  . INTRAVASCULAR ULTRASOUND/IVUS N/A 11/03/2019   Procedure: Intravascular Ultrasound/IVUS;  Surgeon: Nelva Bush, MD;  Location: [place] CV LAB;  Service: Cardiovascular;  Laterality: N/A;  . KNEE ARTHROSCOPY Left 2004  . LEFT HEART CATH AND CORONARY ANGIOGRAPHY N/A 11/03/2019   Procedure: LEFT HEART CATH AND CORONARY ANGIOGRAPHY;  Surgeon: Nelva Bush, MD;  Location: [place] CV LAB;  Service: Cardiovascular;  Laterality: N/A;  . LOOP RECORDER INSERTION N/A 11/30/2019   Procedure: LOOP RECORDER INSERTION;  Surgeon: Thompson Grayer, MD;  Location: [place] CV LAB;  Service: Cardiovascular;  Laterality: N/A;  . MELANOMA EXCISION  2015   left great toe  . REPAIR PERONEAL TENDONS ANKLE  2004  . SUBDURAL HEMATOMA EVACUATION VIA CRANIOTOMY  2008      week later  post-op  [facility]  . TEE WITHOUT CARDIOVERSION N/A 11/04/2019   Procedure: TRANSESOPHAGEAL ECHOCARDIOGRAM (TEE);  Surgeon: Gaye Pollack, MD;  Location: [facility];  Service: Open Heart Surgery;  Laterality: N/A;  . VULVECTOMY N/A 10/24/2015   Procedure: WIDE LOCAL EXCISION OF THE VULVA ;  Surgeon: Everitt Amber, MD;  Location: [facility];  Service: Gynecology;  Laterality: N/A;   Social History:  reports that she has never smoked. She has never used  smokeless tobacco. She reports that she does not drink alcohol or use drugs.  Family / [place] Patient Roles: Other (Comment) Anticipated Caregiver: Mayra Neer Ability/Limitations of Caregiver: Has back problems Caregiver Availability: 24/7  Social History Preferred language: English Religion: Baptist Read: Yes Write: Yes Employment Status: Retired Date Retired/Disabled/Unemployed: Banker   Abuse/Neglect Abuse/Neglect Assessment Can Be Completed: Yes Physical Abuse: Denies Verbal Abuse: Denies Sexual Abuse: Denies Exploitation of patient/patient's resources: Denies Self-Neglect: Denies  Emotional Status Pt's affect, behavior and adjustment status: Flat affect, normal mood and behavior Recent Psychosocial Issues: Anxiety  Patient / Family Perceptions, Expectations & Goals Pt/Family understanding of illness & functional limitations: Patient has a good understanding of current health and functional limitations Premorbid pt/family roles/activities: Minimal assistance PTA since CABG; Prior to CABG; was Independent with limited community and ate out Anticipated changes in roles/activities/participation: May need supervision at discharge Pt/family expectations/goals: Patient would like to be able to get to a point where her niece and [name] can assist without Teacher, music available at discharge: [name] can provide transportation at discharge Resource referrals recommended: Neuropsychology  Discharge Planning Living Arrangements: [place]: Other relatives Type of Residence: Private residence Insurance Resources: Multimedia programmer (specify)(Health [employer] Advantage) Money Management: Patient Does the patient have any problems obtaining your medications?: No Home Management: [name] and Niece will manage home at discharge Patient/Family Preliminary Plans: Return to her home with [name] and niece will assist for  about a month Social Work Anticipated Follow Up Needs: [place] Additional Notes/Comments: Home with daughter and niece for about a month and then daughter solo Expected length of stay: 10-14 days  Clinical Impression Pleasant patient very independent prior to CABG, lived alone for the past 5 years since husband's death. Reported too much trouble to cook for one person and she started eating out fast food restaurants = cardiac troubles. Reported niece and daughter can come in and assist for about a month. Daughter has been helping since CABG surgery and she has several pieces of DME. Hopeful to get to a level she can manage solo and not be a burden on her daughter who has back issues herself.  Dorien Chihuahua B 12/01/2019, 5:29 PM

## 2019-12-01 NOTE — Progress Notes (Signed)
Inpatient Rehabilitation Medication Review by a Pharmacist  A complete drug regimen review was completed for this patient to identify any potential clinically significant medication issues.  Clinically significant medication issues were identified:  No  Pharmacist comments:    -  DAPT x 3 weeks then drop Aspirin and continue Plavix.       Aspirin begun 11/25/19; stop date is in place, last dose 12/16/19.   - Hydroxyurea 500 mg daily as prior to admission for thrombocytosis.     Time spent performing this drug regimen review (minutes):  15 minutes   Arty Baumgartner, [place] 12/01/2019 6:51 PM

## 2019-12-01 NOTE — Progress Notes (Signed)
Inpatient Rehabilitation  Patient information reviewed and entered into eRehab system by Tinsleigh Slovacek M. Amaani Guilbault, M.A., CCC/SLP, PPS Coordinator.  Information including medical coding, functional ability and quality indicators will be reviewed and updated through discharge.    

## 2019-12-01 NOTE — Patient Care Conference (Signed)
Inpatient RehabilitationTeam Conference and Plan of Care Update Date: 12/01/2019   Time: 10:20 AM    Patient Name: Yolanda Flowers      Medical Record Number: [id number]  Date of Birth: 1941/01/20 Sex: Female         Room/Bed: 4W22C/4W22C-01 Payor Info: Payor: [employer] / Plan: [employer] PPO / Product Type: *No Product type* /    Admit Date/Time:  11/30/2019  5:18 PM  Primary Diagnosis:  Embolic stroke [facility])  Patient Active Problem List   Diagnosis Date Noted  . Embolic stroke ([place]) 99991111  . Cellulitis of right leg   . Abdominal pain   . Stroke due to embolism ([place]) 11/25/2019  . S/P CABG x 4 11/04/2019  . CAD (coronary artery disease) 10/25/2019  . Accelerating angina ([place]) 08/26/2019  . Vitamin D deficiency 08/24/2019  . Combined hyperlipidemia 08/24/2019  . Body mass index 29.0-29.9, adult 08/24/2019  . Vitamin B 12 deficiency 08/24/2019  . Malignant melanoma of great toe ([place]) 08/24/2019  . Pedal edema 08/24/2019  . Skin cancer 08/24/2019  . Squamous cell carcinoma, scalp/neck 08/24/2019  . Ischial bursitis, right 08/24/2019  . Swelling of limb 08/24/2019  . Renal insufficiency 08/24/2019  . Atherosclerosis of arteries 08/24/2019  . Dyspnea 02/09/2019  . Solitary pulmonary nodule on lung CT 02/09/2019  . Gastrointestinal bleeding 06/23/2018  . VIN III (vulvar intraepithelial neoplasia III) 10/09/2015  . Melanoma in situ ([place]) 06/29/2014  . Essential thrombocytosis ([place]) 06/22/2014  . COPD (chronic obstructive pulmonary disease) ([place]) 06/21/2014  . HTN (hypertension) 06/21/2014  . Allergic rhinitis 06/21/2014  . GERD (gastroesophageal reflux disease) 06/21/2014    Expected Discharge Date: Expected Discharge Date: (Estimated LOS 10-14 days.)  Team Members Present: Physician leading conference: Dr. Leeroy Cha Care Coodinator Present: Nestor Lewandowsky, RN, BSN, CRRN;Christina Sampson Goon, BSW;Genie Alaynna Kerwood, RN, MSN Nurse Present: Isla Pence, RN PT Present: Barrie Folk, PT;Rosita Dechalus, PTA OT Present: Darleen Crocker, OT SLP Present: Jettie Booze, CF-SLP PPS Coordinator present : Ileana Ladd, Burna Mortimer, SLP     Current Status/Progress Goal Weekly Team Focus  Bowel/Bladder   Pt is continent of B/B. LBM 11/29/19  Pt will remain continent of b/b with normal bowel pattern  Q2h toileting.   Swallow/Nutrition/ Hydration   eval pending         ADL's   min A UB self care, mod A UB self care, min A transfers with RW  supervision overall  self care retraining, balance, endurance, strengthening, pt/family edu   Mobility   Eval pending  Eval pending      Communication             Safety/Cognition/ Behavioral Observations  eval pending         Pain   pt reports no pain at this time. C/o minor discomfort of chest when rolling in the bed.  Pt will be pain free  Assess pain Qshift/PRN   Skin   Pt has MASD to buttocks, foam applied. Pt has sm skin tear to R arm, gauze to cover. Mid Sternum Surgical incision, OTA, no signs of infection or drainage  Pt skin will be free of further breakdown/infection  Assess skin qshift/PRN, Dressing changes as needed.    Rehab Goals Patient on target to meet rehab goals: Yes *See Care Plan and progress notes for long and short-term goals.     Barriers to Discharge  Current Status/Progress Possible Resolutions Date Resolved   Nursing  PT  Decreased caregiver support;Medical stability;Home environment access/layout                 OT Other (comments)  none known at this time             SLP                SW     ! level single step entry , has shower seat, RW, SPC, walk in shower and daughter can assist at discharge          Discharge Planning/Teaching Needs:  Home with daughter and niece  TBD   Team Discussion: MD monitoring labs, med stable.  RN A+O, bleeding from loop recorder overnight, constipation, had miralax, had tylenol for pain prior to  therapies.  OT amb min A RW, lives alone, niece to stay 24/7, UB min A, LB mod A, mod a toileting, fatigues quickly, had BM today, S goals.  PT pending eval.  SLP changes in STM and complex prob solving, was Independent PTA   Revisions to Treatment Plan: N/A     Medical Summary Current Status: Medically quite stable, good strength throughout, feels constipated this morning but had a BM, Cr elevated this morning, bleeding from loop recorder site, Hgb decreased this morning. Weekly Focus/Goal: Continue intensive therapies with focus on strength, cognition, functional mobility, continued medical management with monitoring of labs and BP  Barriers to Discharge: Medical stability   Possible Resolutions to Barriers: Continue intensive therapies with focus on strength, cognition, medical monitoring and management.   Continued Need for Acute Rehabilitation Level of Care: The patient requires daily medical management by a physician with specialized training in physical medicine and rehabilitation for the following reasons: Direction of a multidisciplinary physical rehabilitation program to maximize functional independence : Yes Medical management of patient stability for increased activity during participation in an intensive rehabilitation regime.: Yes Analysis of laboratory values and/or radiology reports with any subsequent need for medication adjustment and/or medical intervention. : Yes   I attest that I was present, lead the team conference, and concur with the assessment and plan of the team.   Retta Diones 12/01/2019, 8:39 PM   Team conference was held via web/ teleconference due to [place] - 19

## 2019-12-01 NOTE — Progress Notes (Signed)
Yolanda Ribas, MD  Physician  Physical Medicine and Rehabilitation  Consult Note      Signed  Date of Service:  11/27/2019  7:35 PM      Related encounter: ED to Hosp-Admission (Discharged) from 11/25/2019 in [facility]      Signed      Expand AllCollapse All   Show:Clear all [x] Manual[x] Template[x] Copied  Added by: [x] Raulkar, Clide Deutscher, MD  [] Hover for details          Physical Medicine and Rehabilitation Consult     Reason for Consult: Functional deficits due to stroke.  Referring Physician: Dr. Gwendlyn Deutscher.      HPI: Yolanda Flowers is a 79 y.o. female with history of HTN, thrombocytosis, SDH, COPD, CAD s/p CABG 10/29/19 who was admitted on 11/25/19 with BLE weakness and LUE weakness. CTA head/neck was negative for acute abnormality or LVO and showed moderate to marked chronic microvascular changes. MRI brain done revealing scattered bilateral acute cerebral and cerebellar infarct and small subacute on chronic small left frontal SDH.  Stroke felt to be embolic and TEE/loop recorder recommended as well as DAPT X 3 weeks.  2D echo showed EF 65-70% with no wall abnormality and mild aortic sclerosis. BLE dopplers were negative for DVT.    Patient with reported visual changes after surgery which progressed to loss of vision in left eye and decrease vision in right eye 2 days past d/c to home after CABG and loss felt to be due to emboli in nature.  Cardiology questioned embolism due to clamping of aorta or cardio-embolic in nature.  Work up underway and plans for [employer]. CIR recommended due to left sided weakness and anticipation of rehab needs.      Review of Systems  Constitutional: Negative for chills and fever.  HENT: Negative for hearing loss and tinnitus.   Eyes: Positive for blurred vision (loss of vision in left and decresed vision in right ).       Had been unable to read  Respiratory: Positive for shortness of breath (unable to walk household distance  without SOB/rest).   Cardiovascular: Positive for chest pain (better since surgery). Negative for leg swelling.  Gastrointestinal: Positive for heartburn. Negative for abdominal pain.  Genitourinary: Negative for dysuria and urgency.  Musculoskeletal: Positive for back pain.  Skin: Negative for rash.  Neurological: Positive for weakness. Negative for dizziness and sensory change.  Psychiatric/Behavioral: The patient does not have insomnia.             Past Medical History:  Diagnosis Date  . Anxiety    . Arthritis    . Asthma    . Bilateral edema of lower extremity    . Complication of anesthesia      difficulty waking  . Congestive heart failure (CHF) ([place])    . COPD (chronic obstructive pulmonary disease) ([place])    . Coronary artery disease    . Dyspnea on exertion    . GERD (gastroesophageal reflux disease)    . History of melanoma excision      left greast toe 2015  . History of squamous cell carcinoma excision      face--  multiple excisions  . History of subdural hematoma      2008  . Hypertension    . OSA (obstructive sleep apnea)      intolerant  . Thrombocytosis ([place])      takes hydoxyurea--  MONITORED BY DR Hinton Rao [name])  . VIN  III (vulvar intraepithelial neoplasia III)    . Wears dentures      UPPER AND LOWER PARTIAL  . Wears glasses             Past Surgical History:  Procedure Laterality Date  . ABDOMINAL HYSTERECTOMY   age 26  . CARDIAC CATHETERIZATION   03/172021  . CORONARY ARTERY BYPASS GRAFT N/A 11/04/2019    Procedure: CORONARY ARTERY BYPASS GRAFTING (CABG) x 4, with ENDOSCOPIC HARVESTING OF RIGHT GREATER SAPHENOUS VEIN.;  Surgeon: Gaye Pollack, MD;  Location: [place] OR;  Service: Open Heart Surgery;  Laterality: N/A;  . INTRAVASCULAR ULTRASOUND/IVUS N/A 11/03/2019    Procedure: Intravascular Ultrasound/IVUS;  Surgeon: Nelva Bush, MD;  Location: [place] [facility];  Service: Cardiovascular;  Laterality: N/A;  . KNEE  ARTHROSCOPY Left 2004  . LEFT HEART CATH AND CORONARY ANGIOGRAPHY N/A 11/03/2019    Procedure: LEFT HEART CATH AND CORONARY ANGIOGRAPHY;  Surgeon: Nelva Bush, MD;  Location: [place] [facility];  Service: Cardiovascular;  Laterality: N/A;  . MELANOMA EXCISION   2015    left great toe  . REPAIR PERONEAL TENDONS ANKLE   2004  . SUBDURAL HEMATOMA EVACUATION VIA CRANIOTOMY   2008       week later  post-op  [facility]  . TEE WITHOUT CARDIOVERSION N/A 11/04/2019    Procedure: TRANSESOPHAGEAL ECHOCARDIOGRAM (TEE);  Surgeon: Gaye Pollack, MD;  Location: [place];  Service: Open Heart Surgery;  Laterality: N/A;  . VULVECTOMY N/A 10/24/2015    Procedure: WIDE LOCAL EXCISION OF THE VULVA ;  Surgeon: Everitt Amber, MD;  Location: [place];  Service: Gynecology;  Laterality: N/A;           Family History  Problem Relation Age of Onset  . Clotting disorder Mother    . Hypertension Mother    . Heart disease Mother    . Arthritis Mother    . Stroke Father    . Hypertension Father    . Heart disease Father    . Arthritis Father    . Emphysema Father    . Asthma Father    . Kidney failure Father    . Hypertension Sister    . Heart disease Sister    . Arthritis Sister    . Hypertension Brother    . Heart disease Brother    . Arthritis Brother        Social History: Married.   Married. Independent but has been limited by angina and SOB prior for months. She reports that she has never smoked. She has never used smokeless tobacco. She reports that she does not drink alcohol or use drugs.           Allergies  Allergen Reactions  . Cephalexin Hives  . Ciprofloxacin Other (See Comments)      Gi intolerance  . Penicillins Rash      Childhood reaction Did it involve swelling of the face/tongue/throat, SOB, or low BP? No Did it involve sudden or severe rash/hives, skin peeling, or any reaction on the inside of your mouth or nose? No Did you need to seek medical attention at  a hospital or doctor's office? No When did it last happen?      50 years ago If all above answers are "NO", may proceed with cephalosporin use.            Medications Prior to Admission  Medication Sig Dispense Refill  . Ascorbic Acid (VITAMIN C)  1000 MG tablet Take 1,000 mg by mouth daily.      Marland Kitchen aspirin EC 325 MG EC tablet Take 1 tablet (325 mg total) by mouth daily.      Marland Kitchen atorvastatin (LIPITOR) 40 MG tablet Take 1 tablet (40 mg total) by mouth daily at 6 PM. 30 tablet 1  . Calcium Carb-Cholecalciferol (CALCIUM 600 + D PO) Take 1 tablet by mouth in the morning and at bedtime.       Marland Kitchen doxycycline (VIBRAMYCIN) 100 MG capsule Take 100 mg by mouth See admin instructions. Take 2 capsules on day 1, then 1 capsule daily.      Marland Kitchen erythromycin ophthalmic ointment Place 1 application into both eyes daily as needed (pain/ irritation).       . ferrous [id number] C-folic acid (TRINSICON / FOLTRIN) capsule Take 1 capsule by mouth 2 (two) times daily after a meal. 60 capsule 1  . fluticasone (FLONASE) 50 MCG/ACT nasal spray Place 2 sprays into both nostrils at bedtime.       . hydroxyurea (HYDREA) 500 MG capsule Take 500 mg by mouth daily.       . lansoprazole (PREVACID) 30 MG capsule Take 30 mg by mouth every morning.       . Magnesium 250 MG TABS Take 250 mg by mouth at bedtime.       . metoprolol tartrate (LOPRESSOR) 25 MG tablet Take 1 tablet (25 mg total) by mouth 2 (two) times daily. 30 tablet 1  . Multiple Vitamins-Minerals (PRESERVISION AREDS 2) CAPS Take 1 capsule by mouth in the morning and at bedtime.      . mupirocin ointment (BACTROBAN) 2 % Apply 1 application topically daily as needed (after cancer removal procedures).       . promethazine (PHENERGAN) 12.5 MG tablet Take 12.5 mg by mouth 2 (two) times daily as needed for nausea or vomiting.      . traMADol (ULTRAM) 50 MG tablet Take 50 mg by mouth every 6 (six) hours as needed for moderate pain. Up to 7 days      . Vitamin D,  Ergocalciferol, (DRISDOL) 1.25 MG (50000 UT) CAPS capsule Take 50,000 Units by mouth every 14 (fourteen) days.      Marland Kitchen ammonium lactate (LAC-HYDRIN) 12 % lotion Apply 1 application topically daily as needed (skin spots).       . [EXPIRED] ciprofloxacin (CIPRO) 250 MG tablet Take 1 tablet (250 mg total) by mouth 2 (two) times daily for 10 days. (Patient not taking: Reported on 11/25/2019) 20 tablet 0  . fluconazole (DIFLUCAN) 150 MG tablet Take 1 tablet by mouth once.          Home: Home Living Family/patient expects to be discharged to:: Inpatient rehab Living Arrangements: Alone Available Help at Discharge: Family Type of Home: House Home Equipment: Kasandra Knudsen - single point Additional Comments: Patients daughter in law helps at home   Lives With: Spouse  Functional History: Prior Function Level of Independence: Independent with assistive device(s) Comments: used a cane. Ptior to CABG she was independent and mowing the lawn on her own  Functional Status:  Mobility: Bed Mobility Overal bed mobility: Needs Assistance Bed Mobility: Supine to Sit, Sit to Supine Supine to sit: Min guard Sit to supine: Min guard General bed mobility comments: Increased time and use of rail. Transfers Overall transfer level: Needs assistance Transfers: Sit to/from Stand, Stand Pivot Transfers Sit to Stand: Min assist Stand pivot transfers: Mod assist, +2 physical assistance, +2 safety/equipment General transfer  comment: MinA for STS; modA +2 for direction of RW and to sequence through task Ambulation/Gait Ambulation/Gait assistance: Mod assist Gait Distance (Feet): 3 Feet Assistive device: Rolling walker (2 wheeled) Gait Pattern/deviations: Step-to pattern(decreased right coordination ) General Gait Details: Decreased coordiantion of the left leg. Patient moved without moving the walker. She required frequent cuing to stay int he walker and not to sit before she got to the chair.  Gait velocity: decreased     ADL: ADL Overall ADL's : Needs assistance/impaired Eating/Feeding: Set up, Sitting Grooming: Set up, Sitting Upper Body Bathing: Set up, Sitting Lower Body Bathing: Moderate assistance, +2 for physical assistance, +2 for safety/equipment, Sitting/lateral leans, Sit to/from stand Upper Body Dressing : Minimal assistance, Sitting Lower Body Dressing: Moderate assistance, +2 for physical assistance, +2 for safety/equipment, Sitting/lateral leans, Sit to/from stand Toilet Transfer: Moderate assistance, +2 for physical assistance, +2 for safety/equipment, Stand-pivot, RW, BSC Toileting- Clothing Manipulation and Hygiene: Moderate assistance, +2 for physical assistance, +2 for safety/equipment, Cueing for safety, Cueing for sequencing, Sitting/lateral lean, Sit to/from stand Functional mobility during ADLs: Moderate assistance, +2 for physical assistance, +2 for safety/equipment, Cueing for safety, Rolling walker General ADL Comments: Pt with deficits in ability to care for self, decreased strength, decreased activity tolerance and decreased balance.   Cognition: Cognition Overall Cognitive Status: Within Functional Limits for tasks assessed Arousal/Alertness: Awake/alert Orientation Level: Oriented X4 Memory: Appears intact Awareness: Appears intact Problem Solving: Appears intact Safety/Judgment: Appears intact Cognition Arousal/Alertness: Awake/alert Behavior During Therapy: WFL for tasks assessed/performed Overall Cognitive Status: Within Functional Limits for tasks assessed General Comments: AOX3    Blood pressure 131/83, pulse 94, temperature 98 F (36.7 C), temperature source Oral, resp. rate 18, height 5\' 6"  (1.676 m), weight 73.5 kg, SpO2 95 %. Physical Exam  Nursing note and vitals reviewed. General: Alert and oriented x 3, No apparent distress HEENT: Head is normocephalic, atraumatic, PERRLA, EOMI, sclera anicteric, oral mucosa pink and moist, dentition intact, ext ear canals  clear,  Neck: Supple without JVD or lymphadenopathy Heart: Reg rate and rhythm. No murmurs rubs or gallops Chest: CTA bilaterally without wheezes, rales, or rhonchi; no distress Abdomen: Soft, non-tender, non-distended, bowel sounds positive. Extremities: No clubbing, cyanosis, or edema. Pulses are 2+ Skin: Clean and intact without signs of breakdown Neuro: Pt is cognitively appropriate with normal insight, memory, and awareness. Cranial nerves 2-12 are intact with the exception or right mild nasolabial fold flattening. Sensory exam is normal. 4+/5 strength throughout. No focal deficits. + RLE dysmetria Psych: Pt's affect is appropriate. Pt is cooperative. Depressed mood, situational.  .  Lab Results Last 24 Hours       Results for orders placed or performed during the hospital encounter of 11/25/19 (from the past 24 hour(s))  Basic metabolic panel     Status: Abnormal    Collection Time: 11/27/19  4:43 AM  Result Value Ref Range    Sodium 140 135 - 145 mmol/L    Potassium 4.0 3.5 - 5.1 mmol/L    Chloride 106 98 - 111 mmol/L    CO2 22 22 - 32 mmol/L    Glucose, Bld 81 70 - 99 mg/dL    BUN 9 8 - 23 mg/dL    Creatinine, Ser 1.03 (H) 0.44 - 1.00 mg/dL    Calcium 8.8 (L) 8.9 - 10.3 mg/dL    GFR calc non Af Amer 52 (L) >60 mL/min    GFR calc Af Amer 60 (L) >60 mL/min    Anion gap  12 5 - 15  CBC     Status: Abnormal    Collection Time: 11/27/19  4:43 AM  Result Value Ref Range    WBC 16.9 (H) 4.0 - 10.5 K/uL    RBC 3.51 (L) 3.87 - 5.11 MIL/uL    Hemoglobin 11.3 (L) 12.0 - 15.0 g/dL    HCT 35.7 (L) 36.0 - 46.0 %    MCV 101.7 (H) 80.0 - 100.0 fL    MCH 32.2 26.0 - 34.0 pg    MCHC 31.7 30.0 - 36.0 g/dL    RDW 21.6 (H) 11.5 - 15.5 %    Platelets 151 150 - 400 K/uL    nRBC 0.3 (H) 0.0 - 0.2 %       Imaging Results (Last 48 hours)  VAS [place] LOWER EXTREMITY VENOUS (DVT)   Result Date: 11/26/2019  Lower Venous DVTStudy Indications: Thrombocytosis.  Risk Factors: Surgery. Comparison  Study: No prior studies. Performing Technologist: Oliver Hum RVT  Examination Guidelines: A complete evaluation includes B-mode imaging, spectral Doppler, color Doppler, and power Doppler as needed of all accessible portions of each vessel. Bilateral testing is considered an integral part of a complete examination. Limited examinations for reoccurring indications may be performed as noted. The reflux portion of the exam is performed with the patient in reverse Trendelenburg.  +---------+---------------+---------+-----------+----------+--------------+ RIGHT    CompressibilityPhasicitySpontaneityPropertiesThrombus Aging +---------+---------------+---------+-----------+----------+--------------+ CFV      Full           Yes      Yes                                 +---------+---------------+---------+-----------+----------+--------------+ SFJ      Full                                                        +---------+---------------+---------+-----------+----------+--------------+ FV Prox  Full                                                        +---------+---------------+---------+-----------+----------+--------------+ FV Mid   Full                                                        +---------+---------------+---------+-----------+----------+--------------+ FV DistalFull                                                        +---------+---------------+---------+-----------+----------+--------------+ PFV      Full                                                        +---------+---------------+---------+-----------+----------+--------------+ POP  Full           Yes      Yes                                 +---------+---------------+---------+-----------+----------+--------------+ PTV      Full                                                        +---------+---------------+---------+-----------+----------+--------------+ PERO     Full                                                         +---------+---------------+---------+-----------+----------+--------------+   +---------+---------------+---------+-----------+----------+--------------+ LEFT     CompressibilityPhasicitySpontaneityPropertiesThrombus Aging +---------+---------------+---------+-----------+----------+--------------+ CFV      Full           Yes      Yes                                 +---------+---------------+---------+-----------+----------+--------------+ SFJ      Full                                                        +---------+---------------+---------+-----------+----------+--------------+ FV Prox  Full                                                        +---------+---------------+---------+-----------+----------+--------------+ FV Mid   Full                                                        +---------+---------------+---------+-----------+----------+--------------+ FV DistalFull                                                        +---------+---------------+---------+-----------+----------+--------------+ PFV      Full                                                        +---------+---------------+---------+-----------+----------+--------------+ POP      Full           Yes      Yes                                 +---------+---------------+---------+-----------+----------+--------------+  PTV      Full                                                        +---------+---------------+---------+-----------+----------+--------------+ PERO     Full                                                        +---------+---------------+---------+-----------+----------+--------------+     Summary: RIGHT: - There is no evidence of deep vein thrombosis in the lower extremity.  - No cystic structure found in the popliteal fossa.  LEFT: - There is no evidence of deep vein thrombosis in the lower  extremity.  - No cystic structure found in the popliteal fossa.  *See table(s) above for measurements and observations.    Preliminary          Assessment/Plan: Diagnosis: Embolic cerebral, cerebellar, corpus callosum basal ganglia, and thalamic infarcts 1. Does the need for close, 24 hr/day medical supervision in concert with the patient's rehab needs make it unreasonable for this patient to be served in a less intensive setting? Yes 2. Co-Morbidities requiring supervision/potential complications: HTN, thrombocytosis, SDH, COPD, CAD s/p CABG 10/29/19 3. Due to bladder management, bowel management, safety, skin/wound care, disease management, medication administration, pain management and patient education, does the patient require 24 hr/day rehab nursing? Yes 4. Does the patient require coordinated care of a physician, rehab nurse, therapy disciplines of PT, OT to address physical and functional deficits in the context of the above medical diagnosis(es)? Yes Addressing deficits in the following areas: balance, endurance, locomotion, strength, transferring, bowel/bladder control, bathing, dressing, feeding, grooming, toileting and psychosocial support 5. Can the patient actively participate in an intensive therapy program of at least 3 hrs of therapy per day at least 5 days per week? Yes 6. The potential for patient to make measurable gains while on inpatient rehab is excellent 7. Anticipated functional outcomes upon discharge from inpatient rehab are min assist  with PT, min assist with OT, modified independent with SLP. 8. Estimated rehab length of stay to reach the above functional goals is: 17-20 days 9. Anticipated discharge destination: Home 10. Overall Rehab/Functional Prognosis: excellent   RECOMMENDATIONS: This patient's condition is appropriate for continued rehabilitative care in the following setting: CIR Patient has agreed to participate in recommended program. Yes Note that  insurance prior authorization may be required for reimbursement for recommended care.   Comment: Mrs. Stoneking would be an excellent CIR candidate. Thank you for this consult. Rehab admission coordinator to follow on Monday.   Reesa Chew, PA-C    I have personally performed a face to face diagnostic evaluation, including, but not limited to relevant history and physical exam findings, of this patient and developed relevant assessment and plan.  Additionally, I have reviewed and concur with the physician assistant's documentation above.   Leeroy Cha, MD

## 2019-12-01 NOTE — Progress Notes (Addendum)
[place] PHYSICAL MEDICINE & REHABILITATION PROGRESS NOTE   Subjective/Complaints: Had BM during session today. Fatigues easily. Bleeding from loop recorder overnight.  Has visual field cut and is compensating well.  ROS: Denies pain. +constipation.   Objective:   EP PPM/ICD IMPLANT  Result Date: 11/30/2019 SURGEON:  Thompson Grayer, MD   PREPROCEDURE DIAGNOSIS:  Cryptogenic Stroke   POSTPROCEDURE DIAGNOSIS:  Cryptogenic Stroke    PROCEDURES:  1. Implantable loop recorder implantation   INTRODUCTION:  Yolanda Flowers is a 79 y.o. female with a history of unexplained stroke who presents today for implantable loop implantation.  The patient has had a cryptogenic stroke.  Despite an extensive workup by neurology, no reversible causes have been identified.  she has worn telemetry during which she did not have arrhythmias.  There is significant concern for possible atrial fibrillation as the cause for the patients stroke.  The patient therefore presents today for implantable loop implantation.   DESCRIPTION OF PROCEDURE:  Informed written consent was obtained.  The patient required no sedation for the procedure today.  The patients left chest was prepped and draped. Mapping over the patient's chest was performed to identify the appropriate ILR site.  This area was found to be the left parasternal region over the 3rd-4th intercostal space.  The skin overlying this region was infiltrated with lidocaine for local analgesia.  A 0.5-cm incision was made at the implant site.  A subcutaneous ILR pocket was fashioned using a combination of sharp and blunt dissection.  A Medtronic Reveal Linq model [id number] implantable loop recorder was then placed into the pocket R waves were very prominent and measured > 0.2 mV. EBL<1 ml.  Steri- Strips and a sterile dressing were then applied.  There were no early apparent complications.   CONCLUSIONS:  1. Successful implantation of a Medtronic Reveal LINQ implantable loop recorder  for cryptogenic stroke  2. No early apparent complications. Thompson Grayer MD, [facility] 11/30/2019 2:59 PM   Recent Labs    11/29/19 0412 12/01/19 0638  WBC 18.7* 16.2*  HGB 12.1 11.3*  HCT 38.5 36.4  PLT 177 173   Recent Labs    11/29/19 0412 12/01/19 0539  NA 141 138  K 4.3 5.7*  CL 107 108  CO2 23 19*  GLUCOSE 97 97  BUN 13 19  CREATININE 1.10* 1.12*  CALCIUM 9.2 8.9    Intake/Output Summary (Last 24 hours) at 12/01/2019 1009 Last data filed at 12/01/2019 0730 Gross per 24 hour  Intake 60 ml  Output --  Net 60 ml     Physical Exam: Vital Signs Blood pressure 121/78, pulse 69, temperature 98 F (36.7 C), temperature source Oral, resp. rate 18, height 5\' 6"  (1.676 m), weight 72.7 kg, SpO2 95 %. Nursing note and vitals reviewed. Constitutional: She is oriented to person, place, and time. She appears well-developed and well-nourished.  Older female in room, sitting up, daughter in law at bedside, NAD  HENT:  Head: Normocephalic and atraumatic.  Mild R facial droop Tongue midline Facial sensation intact  Eyes: Conjunctivae are normal.  ectropion left due to biopsy. L lower lid lag L visual fields are absent and also lower 1/2 of L visual field worse  Neck: No tracheal deviation present.  Cardiovascular:  RRR- no JVD Chest incision healed from CABG 3/18  Respiratory: No stridor.  CTA B/L- coughing with talking, but no upper airway sounds or wheezing, rale,s rhrochi heard  GI: She exhibits no distension.  Soft, NT, ND, (+)BS  Musculoskeletal:        General: No edema.     Cervical back: Normal range of motion and neck supple.     Comments: UEs 5-/5 in deltoids, biceps, triceps, WE, grip and finger abd  LEs- appears equal as well- HF 4+/5, KE 4+/5, DF and PF 5-/5 B/L  Neurological: She is alert and oriented to person, place, and time.  Intact to light touch in all 4 extremities Slowed finger to nose B/L- R slightly worse  Skin: Skin is warm and dry.  R anterior  calf dark bruising- also sign of healing from vein harvesting  Psychiatric: She has a normal mood and affect. Her behavior is normal.   Assessment/Plan: 1. Functional deficits secondary to embolic stroke which require 3+ hours per day of interdisciplinary therapy in a comprehensive inpatient rehab setting.  Physiatrist is providing close team supervision and 24 hour management of active medical problems listed below.  Physiatrist and rehab team continue to assess barriers to discharge/monitor patient progress toward functional and medical goals  Care Tool:  Bathing              Bathing assist       Upper Body Dressing/Undressing Upper body dressing   What is the patient wearing?: Hospital gown only    Upper body assist      Lower Body Dressing/Undressing Lower body dressing      What is the patient wearing?: Incontinence brief     Lower body assist Assist for lower body dressing: Minimal Assistance - Patient > 75%     Toileting Toileting    Toileting assist       Transfers Chair/bed transfer  Transfers assist           Locomotion Ambulation   Ambulation assist              Walk 10 feet activity   Assist           Walk 50 feet activity   Assist           Walk 150 feet activity   Assist           Walk 10 feet on uneven surface  activity   Assist           Wheelchair     Assist               Wheelchair 50 feet with 2 turns activity    Assist            Wheelchair 150 feet activity     Assist          Blood pressure 121/78, pulse 69, temperature 98 F (36.7 C), temperature source Oral, resp. rate 18, height 5\' 6"  (1.676 m), weight 72.7 kg, SpO2 95 %.  Medical Problem List and Plan: 1.  Impaired Function, ADLs and moblity secondary to B/L embolic strokes- thalamus, corpus callosum, and Basal ganglia             -patient may shower- still has sternal precautions from previous CABG  11/04/19             -ELOS/Goals: 2-2.5 weeks; mod I to Supervision 2.  Antithrombotics: -DVT/anticoagulation:  Pharmaceutical: Lovenox             -antiplatelet therapy: ASA to stop after 3 weeks and Plavix to continue.  3. Chronic back pain.Pain Management: Worse with activity--used oxycodone--does not want tramadol "it causes stroke" 4. Mood: LCSW to follow for evaluation and  support             -antipsychotic agents: N/A 5. Neuropsych: This patient is capable of making decisions on her own behalf. 6. Skin/Wound Care: Routine pressure relief measures. Cellulitis has resolved.  7. Fluids/Electrolytes/Nutrition: Monitor I/O. Check lytes in am. 8. CAD s/p CABG 10/29/19: Continue sternal precautions.  9. Allergies: Uses flonase at nights.  10. HTN: Monitor BP tid--continue  11.  Thrombocytosis: Leucocytosis due to hydrea--Vitamin D 50,000 units every 2 weeks. She has been evaluated by Hem/Onc--WBC around 18, 0000 due to hydrea. Will continue to monitor for fevers or other signs of infection.  13. Dyslipidemia: On Lipitor 14. Constipation: Had BM 4/14. Added sorbitol PRN 15. Hyperkalemia: K+ 5.7 today. Kayexalate 66mL x1. Repeat BMP tomorrow 16. Bleeding from loop recorder site: Hgb decreased to 11.3 today. Trend tomorrow. Lovenox held today.   LOS: 1 days A FACE TO FACE EVALUATION WAS PERFORMED  Ritik Stavola P Nakyia Dau 12/01/2019, 10:09 AM

## 2019-12-01 NOTE — Evaluation (Signed)
Occupational Therapy Assessment and Plan  Patient Details  Name: Yolanda Flowers MRN: [id number] Date of Birth: 08/26/1940  OT Diagnosis: abnormal posture, ataxia, hemiplegia affecting dominant side and coordination disorder Rehab Potential: Rehab Potential (ACUTE ONLY): Fair ELOS: 10-14 days   Today's Date: 12/01/2019 OT Individual Time: 0900-1000 OT Individual Time Calculation (min): 60 min     Problem List:  Patient Active Problem List   Diagnosis Date Noted  . Embolic stroke ([place]) 34/19/3790  . Cellulitis of right leg   . Abdominal pain   . Stroke due to embolism ([place]) 11/25/2019  . S/P CABG x 4 11/04/2019  . CAD (coronary artery disease) 10/25/2019  . Accelerating angina ([place]) 08/26/2019  . Vitamin D deficiency 08/24/2019  . Combined hyperlipidemia 08/24/2019  . Body mass index 29.0-29.9, adult 08/24/2019  . Vitamin B 12 deficiency 08/24/2019  . Malignant melanoma of great toe ([place]) 08/24/2019  . Pedal edema 08/24/2019  . Skin cancer 08/24/2019  . Squamous cell carcinoma, scalp/neck 08/24/2019  . Ischial bursitis, right 08/24/2019  . Swelling of limb 08/24/2019  . Renal insufficiency 08/24/2019  . Atherosclerosis of arteries 08/24/2019  . Dyspnea 02/09/2019  . Solitary pulmonary nodule on lung CT 02/09/2019  . Gastrointestinal bleeding 06/23/2018  . VIN III (vulvar intraepithelial neoplasia III) 10/09/2015  . Melanoma in situ ([place]) 06/29/2014  . Essential thrombocytosis ([place]) 06/22/2014  . COPD (chronic obstructive pulmonary disease) ([place]) 06/21/2014  . HTN (hypertension) 06/21/2014  . Allergic rhinitis 06/21/2014  . GERD (gastroesophageal reflux disease) 06/21/2014    Past Medical History:  Past Medical History:  Diagnosis Date  . Anxiety   . Arthritis   . Asthma   . Bilateral edema of lower extremity   . Complication of anesthesia    difficulty waking  . Congestive heart failure (CHF) ([place])   . COPD (chronic obstructive pulmonary disease) ([place])   .  Coronary artery disease   . Dyspnea on exertion   . GERD (gastroesophageal reflux disease)   . History of melanoma excision    left greast toe 2015  . History of squamous cell carcinoma excision    face--  multiple excisions  . History of subdural hematoma    2008  . Hypertension   . OSA (obstructive sleep apnea)    intolerant  . Thrombocytosis ([place])    takes hydoxyurea--  MONITORED BY DR Hinton Rao [facility]  . VIN III (vulvar intraepithelial neoplasia III)   . Wears dentures    UPPER AND LOWER PARTIAL  . Wears glasses    Past Surgical History:  Past Surgical History:  Procedure Laterality Date  . ABDOMINAL HYSTERECTOMY  age 54  . CARDIAC CATHETERIZATION  03/172021  . CORONARY ARTERY BYPASS GRAFT N/A 11/04/2019   Procedure: CORONARY ARTERY BYPASS GRAFTING (CABG) x 4, with ENDOSCOPIC HARVESTING OF RIGHT GREATER SAPHENOUS VEIN.;  Surgeon: Gaye Pollack, MD;  Location: [place] OR;  Service: Open Heart Surgery;  Laterality: N/A;  . INTRAVASCULAR ULTRASOUND/IVUS N/A 11/03/2019   Procedure: Intravascular Ultrasound/IVUS;  Surgeon: Nelva Bush, MD;  Location: [place] CV LAB;  Service: Cardiovascular;  Laterality: N/A;  . KNEE ARTHROSCOPY Left 2004  . LEFT HEART CATH AND CORONARY ANGIOGRAPHY N/A 11/03/2019   Procedure: LEFT HEART CATH AND CORONARY ANGIOGRAPHY;  Surgeon: Nelva Bush, MD;  Location: [place] CV LAB;  Service: Cardiovascular;  Laterality: N/A;  . LOOP RECORDER INSERTION N/A 11/30/2019   Procedure: LOOP RECORDER INSERTION;  Surgeon: Thompson Grayer, MD;  Location: [place] CV LAB;  Service: Cardiovascular;  Laterality: N/A;  . MELANOMA EXCISION  2015   left great toe  . REPAIR PERONEAL TENDONS ANKLE  2004  . SUBDURAL HEMATOMA EVACUATION VIA CRANIOTOMY  2008      week later  post-op  [facility]  . TEE WITHOUT CARDIOVERSION N/A 11/04/2019   Procedure: TRANSESOPHAGEAL ECHOCARDIOGRAM (TEE);  Surgeon: Gaye Pollack, MD;  Location: [place];  Service:  Open Heart Surgery;  Laterality: N/A;  . VULVECTOMY N/A 10/24/2015   Procedure: WIDE LOCAL EXCISION OF THE VULVA ;  Surgeon: Everitt Amber, MD;  Location: [place];  Service: Gynecology;  Laterality: N/A;    Assessment & Plan Clinical Impression: Patient is a 79 y.o. year old female with history of HTN, chronic thrombocytosis, CAD s/p CABG 10/29/2019 who was admitted on 11/25/2019 with LUE weakness and visual changes.  Patient reported visual changes after surgery with progressive loss of vision in left eye and decreased vision in right eye 2 days after discharge from CABG.   CTA head/neck was negative for acute abnormality and showed moderate to marked chronic microvascular changes.  MRI brain done revealing scattered bilateral acute cerebral and cerebellar infarcts and small subacute acute on chronic left frontal SDH.  Stroke was felt to be embolic and neurology recommended DAPT x3-week as well as TEE/loop recorder for work-up.  2D echo showed EF of 65 to 70% with no wall abnormality and mild aortic sclerosis.  BLE Dopplers were negative for DVT.  Cardiology recommended embolism due to clamping of aorta or cardioembolic in nature.  She underwent loop recorder placement on 04/13 by Dr. Rayann Heman.  .  Patient transferred to CIR on 11/30/2019 .    Patient currently requires min - mod A with basic self-care skills secondary to muscle weakness, decreased cardiorespiratoy endurance, decreased coordination, field cut, decreased attention to left, decreased safety awareness and decreased sitting balance, decreased standing balance, decreased postural control and decreased balance strategies.  Prior to hospitalization, patient could complete ADls and IADLs with independent .  Patient will benefit from skilled intervention to decrease level of assist with basic self-care skills prior to discharge home with care partner.  Anticipate patient will require 24 hour supervision and follow up home health.  OT -  End of Session Activity Tolerance: Decreased this session Endurance Deficit: Yes Endurance Deficit Description: multiple rest breaks needed for self care tasks OT Assessment Rehab Potential (ACUTE ONLY): Fair OT Barriers to Discharge: Other (comments) OT Barriers to Discharge Comments: none known at this time OT Patient demonstrates impairments in the following area(s): Balance;Motor;Endurance;Cognition;Pain;Safety OT Basic ADL's Functional Problem(s): Grooming;Bathing;Dressing;Toileting OT Advanced ADL's Functional Problem(s): Simple Meal Preparation OT Transfers Functional Problem(s): Toilet;Tub/Shower OT Additional Impairment(s): None OT Plan OT Intensity: Minimum of 1-2 x/day, 45 to 90 minutes OT Frequency: 5 out of 7 days OT Duration/Estimated Length of Stay: 10-14 days OT Treatment/Interventions: Teacher, English as a foreign language;Therapeutic Activities;Patient/family education;Psychosocial support;Community reintegration;Functional mobility training;Self Care/advanced ADL retraining;UE/LE Strength taining/ROM;Therapeutic Exercise;Discharge planning;UE/LE Coordination activities;Pain management OT Self Feeding Anticipated Outcome(s): n/a OT Basic Self-Care Anticipated Outcome(s): supervision OT Toileting Anticipated Outcome(s): supervision OT Bathroom Transfers Anticipated Outcome(s): supervision OT Recommendation Recommendations for Other Services: Neuropsych consult Patient destination: Home Follow Up Recommendations: Home health OT Equipment Recommended: To be determined   Skilled Therapeutic Intervention Upon entering the room, pt supine in bed with no c/o pain and agreeable to OT intervention. Pt performing supine >sit with min A to EOB. Pt requesting to use bathroom and ambulates to bathroom with RW  and min A. Pt needing mod A for clothing management and hygiene after having BM. Pt verbalized sternal precautions and maintained throughout session.  Pt washing LB while seated on commode for time management. She transferred to sitting on EOB for UB bathing and dressing with min A overall. Pt very fatigued and returns to supine with mod A. OT educated pt on OT purpose, POC, and goals. Pt verbalized understanding and agreement.  OT Evaluation Precautions/Restrictions  Precautions Precautions: Sternal Precaution Comments: Demonstrated appropriate sternal precautions Restrictions Weight Bearing Restrictions: Yes RUE Weight Bearing: Partial weight bearing RUE Partial Weight Bearing Percentage or Pounds: (5lbs or less with both upper ext) LUE Weight Bearing: Partial weight bearing LUE Partial Weight Bearing Percentage or Pounds: (less than 5LB) Pain Pain Assessment Pain Scale: 0-10 Pain Score: 0-No pain Home Living/Prior Functioning Home Living Available Help at Discharge: Family, Available 24 hours/day Type of Home: House Home Access: Other (comment)(1 STE) Home Layout: One level Bathroom Shower/Tub: Tub only, Walk-in shower Bathroom Toilet: Handicapped height Additional Comments: Patients daughter in law helps at home. niece will be coming to stay with her at discharge.  Lives With: Alone Prior Function Level of Independence: Requires assistive device for independence Vocation: Retired Comments: used a cane. Ptior to CABG she was independent and mowing the lawn on her own  Vision Baseline Vision/History: Wears glasses Wears Glasses: At all times Patient Visual Report: No change from baseline;Other (comment)(L eye with visual deficits prior to this episode) Vision Assessment?: Yes Eye Alignment: Within Functional Limits Ocular Range of Motion: Within Functional Limits Alignment/Gaze Preference: Within Defined Limits Tracking/Visual Pursuits: Able to track stimulus in all quads without difficulty Saccades: Impaired - to be further tested in functional context(L nystagmus pt reports it was happening before this  episode) Perception  Perception: Within Functional Limits Praxis Praxis: Intact Cognition Overall Cognitive Status: Impaired/Different from baseline Arousal/Alertness: Awake/alert Orientation Level: Person;Place;Situation Person: Oriented Place: Oriented Situation: Oriented Year: 2021 Month: April Day of Week: Correct Memory: Impaired Memory Impairment: Decreased short term memory Decreased Short Term Memory: Verbal basic;Functional complex Immediate Memory Recall: Sock;Blue;Bed Memory Recall Sock: Without Cue Memory Recall Blue: Without Cue Memory Recall Bed: Without Cue Attention: Sustained Sustained Attention: Appears intact Awareness: Impaired Awareness Impairment: Anticipatory impairment Problem Solving: Appears intact Executive Function: Initiating;Sequencing;Decision Making Sequencing: Appears intact Decision Making: Appears intact Initiating: Appears intact Safety/Judgment: Appears intact Sensation Sensation Light Touch: Appears Intact Proprioception: Appears Intact Coordination Gross Motor Movements are Fluid and Coordinated: No Fine Motor Movements are Fluid and Coordinated: Yes Coordination and Movement Description: mild ataxia in the RLE Heel Shin Test: mild ataxia RLE Motor  Motor Motor: Hemiplegia;Ataxia Motor - Skilled Clinical Observations: R side ataxia and weakness Mobility  Bed Mobility Bed Mobility: Rolling Right;Rolling Left;Sit to Supine;Supine to Sit Rolling Right: Minimal Assistance - Patient > 75% Rolling Left: Minimal Assistance - Patient > 75% Supine to Sit: Minimal Assistance - Patient > 75% Sit to Supine: Minimal Assistance - Patient > 75% Transfers Sit to Stand: Moderate Assistance - Patient 50-74%  Trunk/Postural Assessment  Cervical Assessment Cervical Assessment: Exceptions to WFL(rounded shoulders) Thoracic Assessment Thoracic Assessment: Exceptions to [facility] Lumbar Assessment Lumbar Assessment: Exceptions to [facility] Postural  Control Postural Control: Deficits on evaluation(dealyed in standing poor anterior/posterior control)  Balance Balance Balance Assessed: Yes Static Sitting Balance Static Sitting - Level of Assistance: 5: Stand by assistance Dynamic Sitting Balance Dynamic Sitting - Level of Assistance: 5: Stand by assistance Static Standing Balance Static Standing - Balance Support: No upper extremity  supported Static Standing - Level of Assistance: 4: Min assist Dynamic Standing Balance Dynamic Standing - Balance Support: During functional activity;No upper extremity supported Dynamic Standing - Level of Assistance: 3: Mod assist Extremity/Trunk Assessment RUE Assessment RUE Assessment: Within Functional Limits LUE Assessment LUE Assessment: Within Functional Limits     Refer to Care Plan for Long Term Goals  Recommendations for other services: Neuropsych   Discharge Criteria: Patient will be discharged from OT if patient refuses treatment 3 consecutive times without medical reason, if treatment goals not met, if there is a change in medical status, if patient makes no progress towards goals or if patient is discharged from hospital.  The above assessment, treatment plan, treatment alternatives and goals were discussed and mutually agreed upon: by patient  Gypsy Decant 12/01/2019, 12:38 PM

## 2019-12-01 NOTE — Progress Notes (Signed)
Pt has attempted to void 2x, PVR bladder scan >350, but Pt states she feels like she did not empty her bladder. Pt states " I think I will have better luck if I was sitting on a toilet." pt will be assisted to the [facility] for next void

## 2019-12-01 NOTE — Progress Notes (Signed)
Physical Therapy Session Note  Patient Details  Name: Yolanda Flowers MRN: [id number] Date of Birth: [birth year]  Today's Date: 12/01/2019 PT Individual Time: 1500-1520 PT Individual Time Calculation (min): 20 min   Short Term Goals: Week 1:  PT Short Term Goal 1 (Week 1): Pt will perform bed<>WC transfers with min assist consistently PT Short Term Goal 2 (Week 1): Pt will ambulate 128f with min assist and LRAD PT Short Term Goal 3 (Week 1): Pt will perform bed mobility with supervision assist  Skilled Therapeutic Interventions/Progress Updates: Pt presented in w/c agreeable to therapy. Pt requesting to ambulate to bathroom. Performed STS from w/c with CGA and ambulated with RW minA with pt stating concentrating on increasing step length with RLE. Performed toilet transfer with minA near [place]. Pt stood from toilet with CGA and performed peri-care with CGA. Pt noted to have smear of BM while performing peri-care and was able to clean with wet washcloth. As pt ambulated back to w/c stated significantly increased fatigue and unable to maintain standing with RW to perform hand hygiene. Once in w/c performed hand hygiene at w/c level and requested to return to bed. Pt stated unable to ambulate any further and felt "clammy". Pt was able to perform stand pivot with CGA to bed and returned to bed with minA. Vitals assessed 131/89 (103) HR 79, spO2 96. Pt repositioned to comfort and left with call bell within reach, bed alarm on, and needs met.    Therapy Documentation Precautions:  Precautions Precautions: Sternal Precaution Comments: Demonstrated appropriate sternal precautions Restrictions Weight Bearing Restrictions: Yes RUE Weight Bearing: Partial weight bearing RUE Partial Weight Bearing Percentage or Pounds: (5lbs or less with both upper ext) LUE Weight Bearing: Partial weight bearing LUE Partial Weight Bearing Percentage or Pounds: (less than 5LB) General: PT Amount of Missed Time  (min): 10 Minutes PT Missed Treatment Reason: Patient fatigue Vital Signs: Therapy Vitals Temp: 98.2 F (36.8 C) Temp Source: Oral Pulse Rate: 84 Resp: 17 BP: 124/78 Patient Position (if appropriate): Sitting Oxygen Therapy SpO2: 97 % O2 Device: Room Air Pain:   Mobility: Bed Mobility Bed Mobility: Rolling Right;Rolling Left;Sit to Supine;Supine to Sit Rolling Right: Minimal Assistance - Patient > 75% Rolling Left: Minimal Assistance - Patient > 75% Supine to Sit: Minimal Assistance - Patient > 75% Sit to Supine: Minimal Assistance - Patient > 75% Transfers Transfers: Sit to SBank of AmericaTransfers Sit to Stand: Moderate Assistance - Patient 50-74% Stand Pivot Transfers: Moderate Assistance - Patient 50 - 74% Transfer (Assistive device): None Locomotion : Gait Ambulation: Yes Gait Distance (Feet): 15 Feet Assistive device: None Gait Gait: Yes Gait Pattern: Impaired Gait Pattern: Poor foot clearance - right;Ataxic;Lateral hip instability Stairs / Additional Locomotion Stairs: No Wheelchair Mobility Wheelchair Mobility: No(sternal precuations.)  Trunk/Postural Assessment : Cervical Assessment Cervical Assessment: Exceptions to WFL(rounded shoulders.) Thoracic Assessment Thoracic Assessment: Exceptions to WWilton Surgery CenterLumbar Assessment Lumbar Assessment: Exceptions to WNorthern Navajo Medical CenterPostural Control Postural Control: Deficits on evaluation(dealyed in standing poor anterior/posterior control)  Balance: Balance Balance Assessed: Yes Static Sitting Balance Static Sitting - Level of Assistance: 5: Stand by assistance Dynamic Sitting Balance Dynamic Sitting - Level of Assistance: 5: Stand by assistance Static Standing Balance Static Standing - Balance Support: No upper extremity supported Static Standing - Level of Assistance: 4: Min assist Dynamic Standing Balance Dynamic Standing - Balance Support: During functional activity;No upper extremity supported Dynamic Standing -  Level of Assistance: 3: Mod assist Exercises:   Other Treatments:  Therapy/Group: Individual Therapy  Flavius Repsher  Paris Hohn, PTA  12/01/2019, 3:59 PM

## 2019-12-02 ENCOUNTER — Inpatient Hospital Stay (HOSPITAL_COMMUNITY): Payer: PPO

## 2019-12-02 ENCOUNTER — Inpatient Hospital Stay (HOSPITAL_COMMUNITY): Payer: PPO | Admitting: Physical Therapy

## 2019-12-02 ENCOUNTER — Other Ambulatory Visit: Payer: Self-pay | Admitting: Surgical

## 2019-12-02 ENCOUNTER — Inpatient Hospital Stay (HOSPITAL_COMMUNITY): Payer: PPO | Admitting: Speech Pathology

## 2019-12-02 DIAGNOSIS — I6340 Cerebral infarction due to embolism of unspecified cerebral artery: Secondary | ICD-10-CM | POA: Diagnosis not present

## 2019-12-02 LAB — BASIC METABOLIC PANEL
Anion gap: 11 (ref 5–15)
BUN: 17 mg/dL (ref 8–23)
CO2: 22 mmol/L (ref 22–32)
Calcium: 8.9 mg/dL (ref 8.9–10.3)
Chloride: 108 mmol/L (ref 98–111)
Creatinine, Ser: 1.03 mg/dL — ABNORMAL HIGH (ref 0.44–1.00)
GFR calc Af Amer: 60 mL/min — ABNORMAL LOW (ref >60)
GFR calc non Af Amer: 52 mL/min — ABNORMAL LOW (ref >60)
Glucose, Bld: 96 mg/dL (ref 70–99)
Potassium: 3.9 mmol/L (ref 3.5–5.1)
Sodium: 141 mmol/L (ref 135–145)

## 2019-12-02 LAB — HEMOGLOBIN AND HEMATOCRIT, BLOOD
HCT: 34.5 % — ABNORMAL LOW (ref 36.0–46.0)
Hemoglobin: 11 g/dL — ABNORMAL LOW (ref 12.0–15.0)

## 2019-12-02 NOTE — Progress Notes (Signed)
Team Conference Report to Patient/Family  Team Conference discussion was reviewed with the patient and daughter, including goals, any changes in plan of care and target discharge date.  Patient and daughter express understanding and are in agreement.  The patient has a target discharge date of (Estimated LOS 10-14 days.). Patient has several pieces of DME at home and Daughter and niece will assist at discharge for about a month.  Yolanda Flowers B 12/02/2019, 8:29 AM

## 2019-12-02 NOTE — Progress Notes (Signed)
Speech Language Pathology Daily Session Note  Patient Details  Name: Yolanda Flowers MRN: [id number] Date of Birth: 1941-01-11  Today's Date: 12/02/2019 SLP Individual Time: [id number] SLP Individual Time Calculation (min): 59 min  Short Term Goals: Week 1: SLP Short Term Goal 1 (Week 1): Pt will demonstate recall of new and daily information with Supervision A cues for use of aids or strategies for short term recall. SLP Short Term Goal 2 (Week 1): Pt will demonstate anticipatory awareness by identifying 2 tasks/activities she will require assistance with at home with Supervision A verbal cues. SLP Short Term Goal 3 (Week 1): Pt will demonstrate ability to problem solving mildly complex to complex functional situations with Supervision A verbal/visual cues.  Skilled Therapeutic Interventions: Pt was seen for skilled ST targeting cognitive goals. Min A verbal and visual cues provided for external aids as compensatory memory strategy to recall therapists' names and disciplines. Pt completed basic to mildly complex money management tasks with cash and coins Mod I with ability to self-correct 1 error. Pt required only Supervision A verbal cues for problem solving organizing a QID pill chart according to medication labels from the [place]. Pt recalled ~90% of familiar medications names with Supervision A verbal cues, although Mod A was required for recall of medication names and functions that are new to this admission. List provided for pt to assist with future recall. Would recommend targeting organization of a pill box according to her list of medicines at next available date, as pt reports she would like to be able to manage this by herself at home. Pt left laying in bed with alarm set and needs within reach. Continue per current plan of care.        Pain Pain Assessment Pain Scale: Faces Faces Pain Scale: Hurts a little bit Pain Type: Acute pain Pain Location: Back Pain Orientation: Mid Pain  Descriptors / Indicators: Sore Pain Onset: Gradual(from sitting - per pt) Pain Intervention(s): Repositioned Multiple Pain Sites: No  Therapy/Group: Individual Therapy  Arbutus Leas 12/02/2019, 7:07 AM

## 2019-12-02 NOTE — Progress Notes (Signed)
[place] PHYSICAL MEDICINE & REHABILITATION PROGRESS NOTE   Subjective/Complaints: Working with SLP today Had some nausea after receiving Kayexalate yesterday. Was happy to hear that her K+ is normal today.  Has no other complaints  ROS: Denies pain. +constipation.   Objective:   EP PPM/ICD IMPLANT  Result Date: 11/30/2019 SURGEON:  Thompson Grayer, MD   PREPROCEDURE DIAGNOSIS:  Cryptogenic Stroke   POSTPROCEDURE DIAGNOSIS:  Cryptogenic Stroke    PROCEDURES:  1. Implantable loop recorder implantation   INTRODUCTION:  Yolanda Flowers is a 79 y.o. female with a history of unexplained stroke who presents today for implantable loop implantation.  The patient has had a cryptogenic stroke.  Despite an extensive workup by neurology, no reversible causes have been identified.  she has worn telemetry during which she did not have arrhythmias.  There is significant concern for possible atrial fibrillation as the cause for the patients stroke.  The patient therefore presents today for implantable loop implantation.   DESCRIPTION OF PROCEDURE:  Informed written consent was obtained.  The patient required no sedation for the procedure today.  The patients left chest was prepped and draped. Mapping over the patient's chest was performed to identify the appropriate ILR site.  This area was found to be the left parasternal region over the 3rd-4th intercostal space.  The skin overlying this region was infiltrated with lidocaine for local analgesia.  A 0.5-cm incision was made at the implant site.  A subcutaneous ILR pocket was fashioned using a combination of sharp and blunt dissection.  A Medtronic Reveal Linq model [id number] implantable loop recorder was then placed into the pocket R waves were very prominent and measured > 0.2 mV. EBL<1 ml.  Steri- Strips and a sterile dressing were then applied.  There were no early apparent complications.   CONCLUSIONS:  1. Successful implantation of a Medtronic Reveal LINQ implantable  loop recorder for cryptogenic stroke  2. No early apparent complications. Thompson Grayer MD, [facility] 11/30/2019 2:59 PM   Recent Labs    12/01/19 0638 12/02/19 0520  WBC 16.2*  --   HGB 11.3* 11.0*  HCT 36.4 34.5*  PLT 173  --    Recent Labs    12/01/19 0539 12/02/19 0520  NA 138 141  K 5.7* 3.9  CL 108 108  CO2 19* 22  GLUCOSE 97 96  BUN 19 17  CREATININE 1.12* 1.03*  CALCIUM 8.9 8.9    Intake/Output Summary (Last 24 hours) at 12/02/2019 1410 Last data filed at 12/02/2019 0730 Gross per 24 hour  Intake 320 ml  Output --  Net 320 ml     Physical Exam: Vital Signs Blood pressure 100/80, pulse 75, temperature 97.9 F (36.6 C), temperature source Oral, resp. rate 16, height 5\' 6"  (1.676 m), weight 72.8 kg, SpO2 95 %. Nursing note and vitals reviewed. Constitutional: She is oriented to person, place, and time. She appears well-developed and well-nourished.  Older female in room, sitting up, daughter in law at bedside, NAD  HENT:  Head: Normocephalic and atraumatic.  Mild R facial droop Tongue midline Facial sensation intact  Eyes: Conjunctivae are normal.  ectropion left due to biopsy. L lower lid lag L visual fields are absent and also lower 1/2 of L visual field worse  Neck: No tracheal deviation present.  Cardiovascular:  RRR- no JVD Chest incision healed from CABG 3/18  Respiratory: No stridor.  CTA B/L- coughing with talking, but no upper airway sounds or wheezing, rale,s rhrochi heard  GI:  She exhibits no distension.  Soft, NT, ND, (+)BS  Musculoskeletal:        General: No edema.     Cervical back: Normal range of motion and neck supple.     Comments: UEs 5-/5 in deltoids, biceps, triceps, WE, grip and finger abd  LEs- appears equal as well- HF 4+/5, KE 4+/5, DF and PF 5-/5 B/L  Neurological: She is alert and oriented to person, place, and time.  Intact to light touch in all 4 extremities Slowed finger to nose B/L- R slightly worse  Skin: Skin is warm and  dry.  R anterior calf dark bruising- also sign of healing from vein harvesting  Psychiatric: She has a normal mood and affect. Her behavior is normal. Good sense f=of humor.   Assessment/Plan: 1. Functional deficits secondary to embolic stroke which require 3+ hours per day of interdisciplinary therapy in a comprehensive inpatient rehab setting.  Physiatrist is providing close team supervision and 24 hour management of active medical problems listed below.  Physiatrist and rehab team continue to assess barriers to discharge/monitor patient progress toward functional and medical goals  Care Tool:  Bathing    Body parts bathed by patient: Right arm, Left arm, Chest, Abdomen, Front perineal area, Right upper leg, Left upper leg, Face   Body parts bathed by helper: Buttocks, Right lower leg, Left lower leg     Bathing assist Assist Level: Moderate Assistance - Patient 50 - 74%     Upper Body Dressing/Undressing Upper body dressing   What is the patient wearing?: Pull over shirt    Upper body assist Assist Level: Minimal Assistance - Patient > 75%    Lower Body Dressing/Undressing Lower body dressing      What is the patient wearing?: Incontinence brief, Pants     Lower body assist Assist for lower body dressing: Minimal Assistance - Patient > 75%     Toileting Toileting    Toileting assist Assist for toileting: Moderate Assistance - Patient 50 - 74%     Transfers Chair/bed transfer  Transfers assist     Chair/bed transfer assist level: Minimal Assistance - Patient > 75%     Locomotion Ambulation   Ambulation assist      Assist level: Moderate Assistance - Patient 50 - 74% Assistive device: No Device Max distance: 20   Walk 10 feet activity   Assist     Assist level: Moderate Assistance - Patient - 50 - 74% Assistive device: No Device   Walk 50 feet activity   Assist Walk 50 feet with 2 turns activity did not occur: Safety/medical concerns          Walk 150 feet activity   Assist           Walk 10 feet on uneven surface  activity   Assist Walk 10 feet on uneven surfaces activity did not occur: Safety/medical concerns         Wheelchair     Assist Will patient use wheelchair at discharge?: No   Wheelchair activity did not occur: Safety/medical concerns(sternal precautions)         Wheelchair 50 feet with 2 turns activity    Assist    Wheelchair 50 feet with 2 turns activity did not occur: Safety/medical concerns       Wheelchair 150 feet activity     Assist  Wheelchair 150 feet activity did not occur: Safety/medical concerns       Blood pressure 100/80, pulse 75, temperature 97.9  F (36.6 C), temperature source Oral, resp. rate 16, height 5\' 6"  (1.676 m), weight 72.8 kg, SpO2 95 %.  Medical Problem List and Plan: 1.  Impaired Function, ADLs and moblity secondary to B/L embolic strokes- thalamus, corpus callosum, and Basal ganglia             -patient may shower- still has sternal precautions from previous CABG 11/04/19             -ELOS/Goals: 2-2.5 weeks; mod I to Supervision  -Continue CIR PT, OT, SLP 2.  Antithrombotics: -DVT/anticoagulation:  Pharmaceutical: Lovenox             -antiplatelet therapy: ASA to stop after 3 weeks and Plavix to continue.  3. Chronic back pain.Pain Management: Worse with activity--used oxycodone--does not want tramadol "it causes stroke" 4. Mood: LCSW to follow for evaluation and support             -antipsychotic agents: N/A 5. Neuropsych: This patient is capable of making decisions on her own behalf. 6. Skin/Wound Care: Routine pressure relief measures. Cellulitis has resolved.  7. Fluids/Electrolytes/Nutrition: Monitor I/O. Check lytes in am. 8. CAD s/p CABG 10/29/19: Continue sternal precautions.  9. Allergies: Uses flonase at nights.  10. HTN: Monitor BP tid--continue  11.  Thrombocytosis: Leucocytosis due to hydrea--Vitamin D 50,000 units  every 2 weeks. She has been evaluated by Hem/Onc--WBC around 18, 0000 due to hydrea. Will continue to monitor for fevers or other signs of infection.  13. Dyslipidemia: On Lipitor 14. Constipation: Had BM 4/14. Added sorbitol PRN  4/15: Having regular BM 15. Hyperkalemia: K+ 5.7 today. Kayexalate 8mL x1. Repeat BMP tomorrow  4/15: K+ has improved to 3.9.  16. Bleeding from loop recorder site: Hgb decreased to 11.3 4/14.   4/15: Hgb continuing to trend downward, ordered repeat Hgb for tomorrow.   LOS: 2 days A FACE TO FACE EVALUATION WAS PERFORMED  Yolanda Flowers 12/02/2019, 2:10 PM

## 2019-12-02 NOTE — Progress Notes (Signed)
Physical Therapy Session Note  Patient Details  Name: Yolanda Flowers MRN: [id number] Date of Birth: 1941/01/11  Today's Date: 12/02/2019 PT Individual Time: 1330-1415 AND 1635-1730 PT Individual Time Calculation (min): 45 min and 36mn   Short Term Goals: Week 1:  PT Short Term Goal 1 (Week 1): Pt will perform bed<>WC transfers with min assist consistently PT Short Term Goal 2 (Week 1): Pt will ambulate 1075fwith min assist and LRAD PT Short Term Goal 3 (Week 1): Pt will perform bed mobility with supervision assist  Skilled Therapeutic Interventions/Progress Updates:  Session 1  Pt received supine in bed and agreeable to PT. Supine>sit transfer with supervision assist and min cues for decreased use pf bed features as able.   Stand [facility] and min assist to prevent R lateral LOB. [facility] G[facility] training with RW x 10081fith min assist for safety. Cues for posture, decreased step length R, and improved forward gaze.   PT instructed pt in dynamic balance and coordination training to performed foot taps on targets placed on floor 2 x 8 BLE, placed on 6 inch step x 7 BLE, and to tap BLE on 2 cones x 8 BLE. Min assist for safety and improved pelvic alignment as well as cues for decreased speed and control of RLE as able.   Patient returned to room and left sitting in [facility] call bell in reach and all needs met.     Session 2.  Pt received supine in bed and agreeable to PT. Supine>sit transfer with supevision assist and min cues for safety. Stand pivot transfer to [facility] and min cues for gait pattern to maintain wide BOS and prevent R lateral lean.   [facility] to Nustep on the L with CGA and RW. nustep reciprocal movement/endurance training x [id number] min, level 5, and min cues for increased steps per min to improve endurance training benefits.   Dynamic Gait training with RW to weave  through 6 cones x 2 with min assist and min cues for AD management, posture and symmetrical step length.   PT instructed pt in dynamic balance training to perform forward and lateral toss of ball off trampoline x 1 min each direction. Min assist throughout with increasing cues for awareness of posture and knee extension on the R side to prevent LOB.   Pt returned to room and performed stand pivot transfer to bed with min assist and RW. Sit>supine completed with supervision assist  For safety, and left supine in bed with call bell in reach and all needs met.       Therapy Documentation Precautions:  Precautions Precautions: Sternal Precaution Comments: Demonstrated appropriate sternal precautions Restrictions Weight Bearing Restrictions: Yes(sternal precautions) RUE Weight Bearing: Partial weight bearing RUE Partial Weight Bearing Percentage or Pounds: (5lbs or less with both upper ext) LUE Weight Bearing: Partial weight bearing LUE Partial Weight Bearing Percentage or Pounds: (less than 5LB) Vital Signs: Therapy Vitals Pulse Rate: 75 BP: 100/80 Pain: denies   Therapy/Group: Individual Therapy  AustLorie Phenix5/2021, 2:19 PM

## 2019-12-02 NOTE — Plan of Care (Signed)
  Problem: RH SKIN INTEGRITY Goal: RH STG SKIN FREE OF INFECTION/BREAKDOWN Outcome: Progressing Goal: RH STG MAINTAIN SKIN INTEGRITY WITH ASSISTANCE Description: STG Maintain Skin Integrity With Assistance. Outcome: Progressing Goal: RH STG ABLE TO PERFORM INCISION/WOUND CARE W/ASSISTANCE Description: STG Able To Perform Incision/Wound Care With Assistance. Outcome: Progressing   Problem: RH SAFETY Goal: RH STG ADHERE TO SAFETY PRECAUTIONS W/ASSISTANCE/DEVICE Description: STG Adhere to Safety Precautions With Assistance/Device. Outcome: Progressing Goal: RH STG DECREASED RISK OF FALL WITH ASSISTANCE Description: STG Decreased Risk of Fall With Assistance. Outcome: Progressing

## 2019-12-02 NOTE — Progress Notes (Signed)
Occupational Therapy Session Note  Patient Details  Name: Yolanda Flowers MRN: [id number] Date of Birth: [birth year]  Today's Date: 12/02/2019 OT Individual Time: 1100-1155 OT Individual Time Calculation (min): 55 min    Short Term Goals: Week 1:  OT Short Term Goal 1 (Week 1): Pt will perform toileting at min A overall. OT Short Term Goal 2 (Week 1): Pt will maintain dynamic standing balance with min A during self care tasks. OT Short Term Goal 3 (Week 1): Pt will perform LB dressing with min A overall.  Skilled Therapeutic Interventions/Progress Updates:    1:1. Pt received in bed agreeable to bathing, dressing and grooming at sink level. MIN A stand pivot without AD and stooped posture with VC for upright nature and sternal precautions. Pt completes groomin seated at sink with set up. UB bathing with A provided to wash back seated in w/c with S, LB bathing with MIN A for standing balance to wash buttocks and VC for hands on knees when standing up for adherence to sternal precautions. Pt completes LB dressing with MIN A for balance and S for footwear. Pt demo difficulty with donning R sock. Pt agreeable to seeing sock aide and OT demo. Pt requesting to trial next session. Exited session with pt seated in bed, exit alarm on and call light in reach.  Therapy Documentation Precautions:  Precautions Precautions: Sternal Precaution Comments: Demonstrated appropriate sternal precautions Restrictions Weight Bearing Restrictions: Yes RUE Weight Bearing: Partial weight bearing RUE Partial Weight Bearing Percentage or Pounds: (5lbs or less with both upper ext) LUE Weight Bearing: Partial weight bearing LUE Partial Weight Bearing Percentage or Pounds: (less than 5LB) General:   Vital Signs:   Pain: Pain Assessment Pain Scale: Faces Faces Pain Scale: Hurts a little bit Pain Type: Acute pain Pain Location: Back Pain Orientation: Mid Pain Descriptors / Indicators: Sore Pain Onset:  Gradual(from sitting - per pt) Pain Intervention(s): Repositioned Multiple Pain Sites: No ADL:   Vision   Perception    Praxis   Exercises:   Other Treatments:     Therapy/Group: Individual Therapy  Tonny Branch 12/02/2019, 11:13 AM

## 2019-12-03 ENCOUNTER — Inpatient Hospital Stay (HOSPITAL_COMMUNITY): Payer: PPO | Admitting: Physical Therapy

## 2019-12-03 ENCOUNTER — Inpatient Hospital Stay (HOSPITAL_COMMUNITY): Payer: PPO | Admitting: Occupational Therapy

## 2019-12-03 ENCOUNTER — Inpatient Hospital Stay (HOSPITAL_COMMUNITY): Payer: PPO | Admitting: Speech Pathology

## 2019-12-03 LAB — HEMOGLOBIN AND HEMATOCRIT, BLOOD
HCT: 34.2 % — ABNORMAL LOW (ref 36.0–46.0)
Hemoglobin: 10.9 g/dL — ABNORMAL LOW (ref 12.0–15.0)

## 2019-12-03 NOTE — Progress Notes (Signed)
Speech Language Pathology Daily Session Note  Patient Details  Name: Yolanda Flowers MRN: [id number] Date of Birth: [birth year]  Today's Date: 12/03/2019 SLP Individual Time: [id number] SLP Individual Time Calculation (min): 61 min  Short Term Goals: Week 1: SLP Short Term Goal 1 (Week 1): Pt will demonstate recall of new and daily information with Supervision A cues for use of aids or strategies for short term recall. SLP Short Term Goal 2 (Week 1): Pt will demonstate anticipatory awareness by identifying 2 tasks/activities she will require assistance with at home with Supervision A verbal cues. SLP Short Term Goal 3 (Week 1): Pt will demonstrate ability to problem solving mildly complex to complex functional situations with Supervision A verbal/visual cues.  Skilled Therapeutic Interventions: Pt was seen for skilled ST targeting cognitive goals. SLP facilitated session with overall Mod A verbal cues for recall of new Rx medicine names and functions. Pt used rehearsal and associations compensatory memory strategies to then recall those medications again at the end of session. Pt used list of current medications to organize a BID pill box with 100% accuracy; she was Mod I for complex problem solving throughout task but required Supervision A verbal cues for working memory and organization throughout task. SLP also provided a memory notebook as additional compensatory memory strategy to aid in carryover of therapy techniques and activities used throughout her stay. Pt demonstrated use of the notebook as well as her schedule as external aid for recall with Min A verbal and visual cues.  Pt left laying in bed with alarm set and needs within reach. Continue per current plan of care.        Pain Pain Assessment Pain Scale: Faces Pain Score: 0-No pain Faces Pain Scale: No hurt  Therapy/Group: Individual Therapy  Arbutus Leas 12/03/2019, 7:06 AM

## 2019-12-03 NOTE — IPOC Note (Signed)
Overall Plan of Care [facility]) Patient Details Name: Yolanda Flowers MRN: [id number] DOB: 1941-05-08  Admitting Diagnosis: Embolic stroke [facility])  Hospital Problems: Principal Problem:   Embolic stroke [facility])     Functional Problem List: Nursing Bowel, Edema, Endurance, Skin Integrity  PT Balance, Edema, Endurance, Motor, Perception, Safety, Skin Integrity  OT Balance, Motor, Endurance, Cognition, Pain, Safety  SLP Cognition  TR         Basic ADL's: OT Grooming, Bathing, Dressing, Toileting     Advanced  ADL's: OT Simple Meal Preparation     Transfers: PT Bed Mobility, Bed to Chair, Car, Furniture, Floor  OT Toilet, Tub/Shower     Locomotion: PT Ambulation, Emergency planning/management officer, Stairs     Additional Impairments: OT None  SLP Social Cognition   Memory, Awareness  TR      Anticipated Outcomes Item Anticipated Outcome  Self Feeding n/a  Swallowing      Basic self-care  supervision  Toileting  supervision   Bathroom Transfers supervision  Bowel/Bladder  Pt will continent x  with LBM 11/29/19  Transfers  Mod I with LRAD  Locomotion  Supervision assist with LRAD at ambulatory level  Communication     Cognition  Mod I  Pain  Less than 3 out of 10 on pain scale  Safety/Judgment  to remain fall free while in rehab   Therapy Plan: PT Intensity: Minimum of 1-2 x/day ,45 to 90 minutes PT Frequency: 5 out of 7 days PT Duration Estimated Length of Stay: 10-14 days OT Intensity: Minimum of 1-2 x/day, 45 to 90 minutes OT Frequency: 5 out of 7 days OT Duration/Estimated Length of Stay: 10-14 days SLP Intensity: Minumum of 1-2 x/day, 30 to 90 minutes SLP Frequency: 3 to 5 out of 7 days SLP Duration/Estimated Length of Stay: 10-14 days   Due to the current state of emergency, patients may not be receiving their 3-hours of Medicare-mandated therapy.   Team Interventions: Nursing Interventions Patient/Family Education, Disease Management/Prevention, Skin  Care/Wound Management, Discharge Planning, Pain Management, Bowel Management, Medication Management  PT interventions Ambulation/gait training, Discharge planning, Functional mobility training, Psychosocial support, Therapeutic Activities, Visual/perceptual remediation/compensation, Wheelchair propulsion/positioning, Therapeutic Exercise, Skin care/wound management, Neuromuscular re-education, Disease management/prevention, Training and development officer, Cognitive remediation/compensation, DME/adaptive equipment instruction, Pain management, Splinting/orthotics, UE/LE Coordination activities, UE/LE Strength taining/ROM, Stair training, Patient/family education, Functional electrical stimulation, Community reintegration  OT Interventions Training and development officer, Engineer, drilling, Therapeutic Activities, Patient/family education, Psychosocial support, Community reintegration, Functional mobility training, Self Care/advanced ADL retraining, UE/LE Strength taining/ROM, Therapeutic Exercise, Discharge planning, UE/LE Coordination activities, Pain management  SLP Interventions Cognitive remediation/compensation, Cueing hierarchy, Functional tasks, Patient/family education, Internal/external aids  TR Interventions    SW/CM Interventions Discharge Planning, Disease Management/Prevention, Psychosocial Support, Patient/Family Education   Barriers to Discharge MD  Medical stability, Decreased family support  Nursing      PT Decreased caregiver support, Medical stability, Home environment access/layout    OT Other (comments) none known at this time  SLP      SW       Team Discharge Planning: Destination: PT-Home ,OT- Home , SLP-Home Projected Follow-up: PT-Home health PT, OT-  Home health OT, SLP-24 hour supervision/assistance(anticipate pt will meet not require follow up ST at discharge) Projected Equipment Needs: PT-To be determined, OT- To be determined, SLP-None recommended by  SLP Equipment Details: PT- , OT-  Patient/family involved in discharge planning: PT- Patient,  OT-Patient, SLP-Patient  MD ELOS: 10-14 days Medical Rehab Prognosis:  Excellent Assessment: Yolanda Flowers is progressing  very well with therapy. She had some bleeding at loop recorder site and at her right forearm IV site, but these sites are healing well. Hgb is being monitored and very slowly trending downward; 10.9 today. K+ was elevated earlier this week but responded well to Kayexalate. Her daughter [name] is concerned regarding the amount of physical support she can provide at home due to her back issues, but current D/C goals are set for supervision.    See Team Conference Notes for weekly updates to the plan of care

## 2019-12-03 NOTE — Progress Notes (Addendum)
Physical Therapy Session Note  Patient Details  Name: Yolanda Flowers MRN: [id number] Date of Birth: 26-Jul-1941  Today's Date: 12/03/2019 PT Individual Time: 9935-7017 AND 1345-1500 PT Individual Time Calculation (min): 30 min and 75 min   Short Term Goals: Week 1:  PT Short Term Goal 1 (Week 1): Pt will perform bed<>WC transfers with min assist consistently PT Short Term Goal 2 (Week 1): Pt will ambulate 178f with min assist and LRAD PT Short Term Goal 3 (Week 1): Pt will perform bed mobility with supervision assist  Skilled Therapeutic Interventions/Progress Updates:  Session 1  Pt received supine in bed and agreeable to PT. Supine>sit transfer with supervision assist from semi-reclined position. [facility]   Dynamic gait training x 1257f+130 over level surface and min assist. Noted to have increased foot drag with fatigue on second bout, improved with cues for heel contact. Forward/reverse with RW 2 x 10 ft each direction. Side stepping at rail 2 x 1575fMin assist overall and cues for posture and improved glute med activation to improve pelvic stability.   Pt returned to room and performed ambulatory transfer to bed with min assist and RW. Sit>supine completed with supervision assist, and left supine in bed with call bell in reach and all needs met.  .  Session 2.  Pt received supine in bed and agreeable to PT. Supine>sit transfer with supervision assist and min cues for safety. PT assisted pt to don shoes sitting EOB for time management. Stand pivot transfer to [facility] min assist an RW. Pt transported to entrance of [place] training over unlevel cement sidewalk 2 x [id number] ft. Supervision assist fading to min assist with fatigue and decreased foot clearance on the R. PT instructed pt in modifie [place] A balance program, HS curls, semitandem stance, hip abduction, mini squats, sit<>stand each completed 8-10 with BUE support on RW. Cues for full ROM,  symmetry R and L and decreased speed. prlonged rest break between bouts. Gait training through simulated community environment of gift shop x [id number] RW and CGA from PT. Min cues for attention to R to prevent foot drag. Throughout treatment pt perform sit<>stand with supervision assist an min cues for anterior weight shifting. Pt returned to room and performed stand pivot transfer to bed with RW and supervision assist. Sit>supine completed with supervision assist, and left supine in bed with call bell in reach and all needs met.        Therapy Documentation Precautions:  Precautions Precautions: Sternal Precaution Comments: Demonstrated appropriate sternal precautions Restrictions Weight Bearing Restrictions: Yes(sternal precautions) RUE Weight Bearing: Partial weight bearing RUE Partial Weight Bearing Percentage or Pounds: (5lbs or less with both upper ext) LUE Weight Bearing: Partial weight bearing LUE Partial Weight Bearing Percentage or Pounds: (less than 5LB)    Vital Signs: Therapy Vitals Temp: 97.8 F (36.6 C) Temp Source: Oral Pulse Rate: 66 Resp: 18 BP: 134/71 Patient Position (if appropriate): Sitting Oxygen Therapy SpO2: 95 % O2 Device: Room Air Pain: denies  Therapy/Group: Individual Therapy  AustiLorie Phenix/2021, 9:55 AM

## 2019-12-03 NOTE — Progress Notes (Signed)
[place] PHYSICAL MEDICINE & REHABILITATION PROGRESS NOTE   Subjective/Complaints: Has some fatigue.  Hgb trending down to 10.9 Cr down to 1.03 Wounds look great  ROS: Denies pain. +constipation.   Objective:   No results found. Recent Labs    12/01/19 0638 12/01/19 0638 12/02/19 0520 12/03/19 0630  WBC 16.2*  --   --   --   HGB 11.3*   < > 11.0* 10.9*  HCT 36.4   < > 34.5* 34.2*  PLT 173  --   --   --    < > = values in this interval not displayed.   Recent Labs    12/01/19 0539 12/02/19 0520  NA 138 141  K 5.7* 3.9  CL 108 108  CO2 19* 22  GLUCOSE 97 96  BUN 19 17  CREATININE 1.12* 1.03*  CALCIUM 8.9 8.9    Intake/Output Summary (Last 24 hours) at 12/03/2019 1646 Last data filed at 12/03/2019 1230 Gross per 24 hour  Intake 640 ml  Output --  Net 640 ml     Physical Exam: Vital Signs Blood pressure 108/64, pulse 68, temperature 98.7 F (37.1 C), temperature source Oral, resp. rate 18, height 5\' 6"  (1.676 m), weight 71.2 kg, SpO2 95 %. Nursing note and vitals reviewed. Constitutional: She is oriented to person, place, and time. She appears well-developed and well-nourished.  Older female in room, sitting up, daughter in law at bedside, NAD  HENT:  Head: Normocephalic and atraumatic.  Mild R facial droop Tongue midline Facial sensation intact  Eyes: Conjunctivae are normal.  ectropion left due to biopsy. L lower lid lag L visual fields are absent and also lower 1/2 of L visual field worse  Neck: No tracheal deviation present.  Cardiovascular:  RRR- no JVD Chest incision healed from CABG 3/18  Respiratory: No stridor.  CTA B/L- coughing with talking, but no upper airway sounds or wheezing, rale,s rhrochi heard  GI: She exhibits no distension.  Soft, NT, ND, (+)BS  Musculoskeletal:        General: No edema.     Cervical back: Normal range of motion and neck supple.     Comments: UEs 5-/5 in deltoids, biceps, triceps, WE, grip and finger  abd  LEs- appears equal as well- HF 4+/5, KE 4+/5, DF and PF 5-/5 B/L  Neurological: She is alert and oriented to person, place, and time.  Intact to light touch in all 4 extremities Slowed finger to nose B/L- R slightly worse  Skin: Skin is warm and dry.  R anterior calf dark bruising- also sign of healing from vein harvesting  Right antecubital fossa and loop recorder incision sites healing nicely Psychiatric: She has a normal mood and affect. Her behavior is normal. Good sense f=of humor.   Assessment/Plan: 1. Functional deficits secondary to embolic stroke which require 3+ hours per day of interdisciplinary therapy in a comprehensive inpatient rehab setting.  Physiatrist is providing close team supervision and 24 hour management of active medical problems listed below.  Physiatrist and rehab team continue to assess barriers to discharge/monitor patient progress toward functional and medical goals  Care Tool:  Bathing    Body parts bathed by patient: Right arm, Left arm, Chest, Abdomen, Front perineal area, Right upper leg, Left upper leg, Face   Body parts bathed by helper: Buttocks, Right lower leg, Left lower leg     Bathing assist Assist Level: Moderate Assistance - Patient 50 - 74%     Upper Body  Dressing/Undressing Upper body dressing   What is the patient wearing?: Pull over shirt    Upper body assist Assist Level: Minimal Assistance - Patient > 75%    Lower Body Dressing/Undressing Lower body dressing      What is the patient wearing?: Incontinence brief, Pants     Lower body assist Assist for lower body dressing: Minimal Assistance - Patient > 75%     Toileting Toileting Toileting Activity did not occur (Clothing management and hygiene only): N/A (no void or bm)  Toileting assist Assist for toileting: Moderate Assistance - Patient 50 - 74%     Transfers Chair/bed transfer  Transfers assist     Chair/bed transfer assist level: Minimal Assistance -  Patient > 75%     Locomotion Ambulation   Ambulation assist      Assist level: Moderate Assistance - Patient 50 - 74% Assistive device: No Device Max distance: 20   Walk 10 feet activity   Assist     Assist level: Moderate Assistance - Patient - 50 - 74% Assistive device: No Device   Walk 50 feet activity   Assist Walk 50 feet with 2 turns activity did not occur: Safety/medical concerns         Walk 150 feet activity   Assist           Walk 10 feet on uneven surface  activity   Assist Walk 10 feet on uneven surfaces activity did not occur: Safety/medical concerns         Wheelchair     Assist Will patient use wheelchair at discharge?: No   Wheelchair activity did not occur: Safety/medical concerns(sternal precautions)         Wheelchair 50 feet with 2 turns activity    Assist    Wheelchair 50 feet with 2 turns activity did not occur: Safety/medical concerns       Wheelchair 150 feet activity     Assist  Wheelchair 150 feet activity did not occur: Safety/medical concerns       Blood pressure 108/64, pulse 68, temperature 98.7 F (37.1 C), temperature source Oral, resp. rate 18, height 5\' 6"  (1.676 m), weight 71.2 kg, SpO2 95 %.  Medical Problem List and Plan: 1.  Impaired Function, ADLs and moblity secondary to B/L embolic strokes- thalamus, corpus callosum, and Basal ganglia             -patient may shower- still has sternal precautions from previous CABG 11/04/19             -ELOS/Goals: 2-2.5 weeks; mod I to Supervision  -Continue CIR PT, OT, SLP 2.  Antithrombotics: -DVT/anticoagulation:  Pharmaceutical: Lovenox             -antiplatelet therapy: ASA to stop after 3 weeks and Plavix to continue.  3. Chronic back pain.Pain Management: Worse with activity--used oxycodone--does not want tramadol "it causes stroke" 4. Mood: LCSW to follow for evaluation and support             -antipsychotic agents: N/A 5. Neuropsych:  This patient is capable of making decisions on her own behalf. 6. Skin/Wound Care: Routine pressure relief measures. Cellulitis has resolved.  7. Fluids/Electrolytes/Nutrition: Monitor I/O. Check lytes in am. 8. CAD s/p CABG 10/29/19: Continue sternal precautions.  9. Allergies: Uses flonase at nights.  10. HTN: Monitor BP tid--continue 11.  Thrombocytosis: Leucocytosis due to hydrea--Vitamin D 50,000 units every 2 weeks. She has been evaluated by Hem/Onc--WBC around 18, 0000 due to hydrea. Will  continue to monitor for fevers or other signs of infection.  13. Dyslipidemia: On Lipitor 14. Constipation: Had BM 4/14. Added sorbitol PRN  4/15: Having regular BM 15. Hyperkalemia: K+ 5.7 today. Kayexalate 61mL x1. Repeat BMP tomorrow  4/15: K+ has improved to 3.9.  16. Bleeding from loop recorder site: Hgb decreased to 11.3 4/14.   4/15: Hgb continuing to trend downward, ordered repeat Hgb for tomorrow.   4//16: Hgb downtrending slightly but may be due to blood draws. No more bleeding from wounds.  17. AKI: Cr trending downward.   LOS: 3 days A FACE TO FACE EVALUATION WAS PERFORMED  Clide Deutscher Trevis Eden 12/03/2019, 4:46 PM

## 2019-12-03 NOTE — Care Management (Signed)
[name], female   DOB: 09/02/1940, 79 y.o.   MRN: [id number] Received a call from daughter Yolanda Flowers regarding questions she had received from her brother on conflicting information about the rehabilitation stay. Worried that the ELOS had been cut in half from what she had been told on acute and she wanted to be sure everyone knew about the lack of support at discharge for the patient. [name] noted she was "it" as far as caregivers at discharge. She stated "I know others say they will help but they do not always show up"; last time (after CABG, they struggled as Yolanda Flowers has back issues and could not manage the care needed) Yolanda Flowers also reports the patient can be stubborn. [name] wants the patient to come home but not be sent home too soon and need more care than she can provide. Reviewed ELOS set for 10-14 days however tentative date not set until team conference on Wednesday 12/08/19 and staff were aware that she would be the only person to assist at discharge. D/C goals were set for supervision and no physical assistance expected at this time. Also patient has STMD and complex problem solving deficits and a visual field cut which is what she would be expected to assist with at discharge. Questioned why she was told she could go to therapy sessions throughout the stay and now being told she cannot attend until close to discharge. Reviewed COVID infection guidelines/limitations set for therapy viewing with Yolanda Flowers so she could share with her brother. THe patient's niece is expected to come to the home for about a week to help Yolanda Flowers provide care at discharge. [name] expressed an understanding of the information reviewed and to contact CM if she has questions between now and team conference on Wednesday.

## 2019-12-03 NOTE — Progress Notes (Signed)
Occupational Therapy Session Note  Patient Details  Name: Yolanda Flowers MRN: [id number] Date of Birth: 05-31-1941  Today's Date: 12/03/2019 OT Individual Time: [id number] OT Individual Time Calculation (min): 42 min   Short Term Goals: Week 1:  OT Short Term Goal 1 (Week 1): Pt will perform toileting at min A overall. OT Short Term Goal 2 (Week 1): Pt will maintain dynamic standing balance with min A during self care tasks. OT Short Term Goal 3 (Week 1): Pt will perform LB dressing with min A overall.  Skilled Therapeutic Interventions/Progress Updates:    Pt greeted in bed, denying pain but feeling "woozy." BP assessed with reading of 126/68. Per chart, WNL. Pt agreeable to complete a few grooming tasks at the sink during session. Mod cuing for sternal precaution adherence during supine<sit. Once EOB, she completed sit<stand with CGA and hands on knees. CGA for short distance ambulatory transfer to w/c placed beside sink using RW. Pt completed 2/5 sinkside tasks in standing to work on standing balance and standing endurance. Note that with onset of fatigue pt rests elbows on sink, verbal and tactile cues for improving upright posture. Remaining tasks were completed in sitting to conserve energy. Pt then ambulated back to bed using RW with CGA, min verbal cuing for upright posture. Supervision for sit<supine. Pt agreeable to allow OT to place lavender scented cotton balls in pillowcase to help her rest before PT. Left pt with all needs within reach and bed alarm set. Tx focus placed on OOB tolerance, standing endurance, dynamic balance, and adherence to sternal precautions during functional activity.   Therapy Documentation Precautions:  Precautions Precautions: Sternal Precaution Comments: Demonstrated appropriate sternal precautions Restrictions Weight Bearing Restrictions: Yes(sternal precautions) RUE Weight Bearing: Partial weight bearing RUE Partial Weight Bearing Percentage or Pounds:  (5lbs or less with both upper ext) LUE Weight Bearing: Partial weight bearing LUE Partial Weight Bearing Percentage or Pounds: (less than 5LB) Vital Signs: Therapy Vitals BP: 138/72 ADL:        Therapy/Group: Individual Therapy  Sadie Pickar A Penne Rosenstock 12/03/2019, 12:29 PM

## 2019-12-04 ENCOUNTER — Inpatient Hospital Stay (HOSPITAL_COMMUNITY): Payer: PPO | Admitting: Physical Therapy

## 2019-12-04 ENCOUNTER — Inpatient Hospital Stay (HOSPITAL_COMMUNITY): Payer: PPO | Admitting: Occupational Therapy

## 2019-12-04 ENCOUNTER — Inpatient Hospital Stay (HOSPITAL_COMMUNITY): Payer: PPO | Admitting: Speech Pathology

## 2019-12-04 NOTE — Progress Notes (Signed)
Speech Language Pathology Daily Session Note  Patient Details  Name: Yolanda Flowers MRN: [id number] Date of Birth: 01-22-1941  Today's Date: 12/04/2019 SLP Individual Time: 1100-1128 SLP Individual Time Calculation (min): 28 min  Short Term Goals: Week 1: SLP Short Term Goal 1 (Week 1): Pt will demonstate recall of new and daily information with Supervision A cues for use of aids or strategies for short term recall. SLP Short Term Goal 2 (Week 1): Pt will demonstate anticipatory awareness by identifying 2 tasks/activities she will require assistance with at home with Supervision A verbal cues. SLP Short Term Goal 3 (Week 1): Pt will demonstrate ability to problem solving mildly complex to complex functional situations with Supervision A verbal/visual cues.  Skilled Therapeutic Interventions:  Skilled treatment session targeted pt's cognition goals. SLP facilitiated session by providing supervision level cues for pt to recall current medicines. Pt able to recall 9 out of 10 current medicines. She was also independent with recall time of next session. She used daily schedule to verify that she was correct. Pt left upright in bed, bed alarm on and all needs within reach. Continue per current plan of care.      Pain Pain Assessment Pain Scale: 0-10 Pain Score: 0-No pain  Therapy/Group: Individual Therapy  Estle Huguley 12/04/2019, 11:22 AM

## 2019-12-04 NOTE — Progress Notes (Signed)
[facility] PHYSICAL MEDICINE & REHABILITATION PROGRESS NOTE   Subjective/Complaints:  Pt reports tired now after getting cleaned up with OT_ got hair washed- very excited about that.  Back hurts some- thinks will be fine after gets lidoderm patch- LBM today.    ROS: Pt denies SOB, abd pain, CP, N/V/C/D, and vision changes   Objective:   No results found. Recent Labs    12/02/19 0520 12/03/19 0630  HGB 11.0* 10.9*  HCT 34.5* 34.2*   Recent Labs    12/02/19 0520  NA 141  K 3.9  CL 108  CO2 22  GLUCOSE 96  BUN 17  CREATININE 1.03*  CALCIUM 8.9    Intake/Output Summary (Last 24 hours) at 12/04/2019 1541 Last data filed at 12/04/2019 1300 Gross per 24 hour  Intake 533 ml  Output --  Net 533 ml     Physical Exam: Vital Signs Blood pressure 104/66, pulse 76, temperature 98.3 F (36.8 C), resp. rate 16, height 5\' 6"  (1.676 m), weight 73.8 kg, SpO2 95 %. Nursing note and vitals reviewed. Constitutional: pt sitting up in bed; OT at side, NAD  HENT:  Head: Normocephalic and atraumatic.  Mild R facial droop- unchanged since last saw her  ectropion left due to biopsy -still there- no change L lower lid lag L visual fields are absent and also lower 1/2 of L visual field - still affected  Neck: No tracheal deviation present.  Cardiovascular:  RRR- no JVD Chest incision healed from CABG 3/18  Respiratory: No stridor.  CTA B/L- no coughing today with speech GI: She exhibits no distension.  Soft, NT, ND, (+)BS  Musculoskeletal:        General: No edema.     Cervical back: Normal range of motion and neck supple.     Comments: UEs 5-/5 in deltoids, biceps, triceps, WE, grip and finger abd  LEs- appears equal as well- HF 4+/5, KE 4+/5, DF and PF 5-/5 B/L  Neurological: She is alert and oriented to person, place, and time.  Intact to light touch in all 4 extremities Slowed finger to nose B/L- R slightly worse  Skin: Skin is warm and dry.  R anterior calf dark  bruising- also sign of healing from vein harvesting  Right antecubital fossa and loop recorder incision sites healing nicely Psychiatric: appropriate  Assessment/Plan: 1. Functional deficits secondary to embolic stroke which require 3+ hours per day of interdisciplinary therapy in a comprehensive inpatient rehab setting.  Physiatrist is providing close team supervision and 24 hour management of active medical problems listed below.  Physiatrist and rehab team continue to assess barriers to discharge/monitor patient progress toward functional and medical goals  Care Tool:  Bathing    Body parts bathed by patient: Right arm, Left arm, Chest, Abdomen, Front perineal area, Right upper leg, Left upper leg, Face   Body parts bathed by helper: Buttocks, Right lower leg, Left lower leg     Bathing assist Assist Level: Moderate Assistance - Patient 50 - 74%     Upper Body Dressing/Undressing Upper body dressing   What is the patient wearing?: Pull over shirt    Upper body assist Assist Level: Supervision/Verbal cueing    Lower Body Dressing/Undressing Lower body dressing      What is the patient wearing?: Pants     Lower body assist Assist for lower body dressing: Contact Guard/Touching assist     Toileting Toileting Toileting Activity did not occur (Clothing management and hygiene only): N/A (no  void or bm)  Toileting assist Assist for toileting: Moderate Assistance - Patient 50 - 74%     Transfers Chair/bed transfer  Transfers assist     Chair/bed transfer assist level: Minimal Assistance - Patient > 75%     Locomotion Ambulation   Ambulation assist      Assist level: Moderate Assistance - Patient 50 - 74% Assistive device: No Device Max distance: 20   Walk 10 feet activity   Assist     Assist level: Moderate Assistance - Patient - 50 - 74% Assistive device: No Device   Walk 50 feet activity   Assist Walk 50 feet with 2 turns activity did not  occur: Safety/medical concerns         Walk 150 feet activity   Assist           Walk 10 feet on uneven surface  activity   Assist Walk 10 feet on uneven surfaces activity did not occur: Safety/medical concerns         Wheelchair     Assist Will patient use wheelchair at discharge?: No   Wheelchair activity did not occur: Safety/medical concerns(sternal precautions)         Wheelchair 50 feet with 2 turns activity    Assist    Wheelchair 50 feet with 2 turns activity did not occur: Safety/medical concerns       Wheelchair 150 feet activity     Assist  Wheelchair 150 feet activity did not occur: Safety/medical concerns       Blood pressure 104/66, pulse 76, temperature 98.3 F (36.8 C), resp. rate 16, height 5\' 6"  (1.676 m), weight 73.8 kg, SpO2 95 %.  Medical Problem List and Plan: 1.  Impaired Function, ADLs and moblity secondary to B/L embolic strokes- thalamus, corpus callosum, and Basal ganglia             -patient may shower- still has sternal precautions from previous CABG 11/04/19             -ELOS/Goals: 2-2.5 weeks; mod I to Supervision  -Continue CIR PT, OT, SLP 2.  Antithrombotics: -DVT/anticoagulation:  Pharmaceutical: Lovenox             -antiplatelet therapy: ASA to stop after 3 weeks and Plavix to continue.  3. Chronic back pain.Pain Management: Worse with activity--used oxycodone--does not want tramadol "it causes stroke"  4/17- likes lidoderm patch- will con't 4. Mood: LCSW to follow for evaluation and support             -antipsychotic agents: N/A 5. Neuropsych: This patient is capable of making decisions on her own behalf. 6. Skin/Wound Care: Routine pressure relief measures. Cellulitis has resolved.  7. Fluids/Electrolytes/Nutrition: Monitor I/O. Check lytes in am. 8. CAD s/p CABG 10/29/19: Continue sternal precautions.  4/17- until 01/04/20  9. Allergies: Uses flonase at nights.  10. HTN: Monitor BP tid--continue 11.   Thrombocytosis: Leucocytosis due to hydrea--Vitamin D 50,000 units every 2 weeks. She has been evaluated by Hem/Onc--WBC around 18, 0000 due to hydrea. Will continue to monitor for fevers or other signs of infection.   4/17- WBC down to 16k- con't to monitor 13. Dyslipidemia: On Lipitor 14. Constipation: Had BM 4/14. Added sorbitol PRN  4/15: Having regular BM  4/17- LBM this AM 15. Hyperkalemia: K+ 5.7 today. Kayexalate 71mL x1. Repeat BMP tomorrow  4/15: K+ has improved to 3.9.  16. Bleeding from loop recorder site: Hgb decreased to 11.3 4/14.   4/15: Hgb continuing to  trend downward, ordered repeat Hgb for tomorrow.   4//16: Hgb downtrending slightly but may be due to blood draws. No more bleeding from wounds.  17. AKI: Cr trending downward.   LOS: 4 days A FACE TO FACE EVALUATION WAS PERFORMED  Yalexa Blust 12/04/2019, 3:41 PM

## 2019-12-04 NOTE — Progress Notes (Signed)
Physical Therapy Session Note  Patient Details  Name: Yolanda Flowers MRN: [id number] Date of Birth: June 20, 1941  Today's Date: 12/04/2019 PT Individual Time: 9480-1655 PT Individual Time Calculation (min): 40 min   Short Term Goals: Week 1:  PT Short Term Goal 1 (Week 1): Pt will perform bed<>WC transfers with min assist consistently PT Short Term Goal 2 (Week 1): Pt will ambulate 188f with min assist and LRAD PT Short Term Goal 3 (Week 1): Pt will perform bed mobility with supervision assist  Skilled Therapeutic Interventions/Progress Updates: Pt presented in bed stating some fatigue but agreeable to therapy. Pt denies pain during session. Performed bed mobility with supervision and use of bed features. Ambulated to toilet with RW and CGA demonstrating good step length and awareness of heel strike with CGA for toilet transfers (+BM/void). Ambulated to sink once completed in same manner and performed hand hygiene in standing supervision. Pt then transferred to w/c and transported to day room for energy conservation. Pt participated in toe taps to cones alternating and crossing midline without AD for balance and coordination. Participated in ambulation without AD 1043fx 2 with minA. Pt requiring cues for increased heel strike with RLE and intermittently increasing step length. Pt with x 1 occurrence of increased lateral sway however pt was able to correct. PTA provided continued education regarding energy conservation due to CABG and CVAs. Pt ambulated back to room with RW and CGA and requested to return to bed due to fatigue. Performed sit to supine on flat bed with supervision and use of bed features. Pt positioned to comfort and left with call bell within reach, bed alarm on, and needs met.      Therapy Documentation Precautions:  Precautions Precautions: Sternal Precaution Comments: Demonstrated appropriate sternal precautions Restrictions Weight Bearing Restrictions: Yes(sternal) RUE  Weight Bearing: Partial weight bearing RUE Partial Weight Bearing Percentage or Pounds: (5lbs or less with both upper ext) LUE Weight Bearing: Partial weight bearing LUE Partial Weight Bearing Percentage or Pounds: (less than 5LB) General:   Vital Signs: Therapy Vitals Pulse Rate: 71 BP: 129/78    Therapy/Group: Individual Therapy  Pollyanna Levay  Jahzaria Vary, PTA  12/04/2019, 12:58 PM

## 2019-12-04 NOTE — Progress Notes (Signed)
Occupational Therapy Session Note  Patient Details  Name: Yolanda Flowers MRN: [id number] Date of Birth: 02/04/1941  Today's Date: 12/04/2019 OT Individual Time: [id number] OT Individual Time Calculation (min): 56 min   Short Term Goals: Week 1:  OT Short Term Goal 1 (Week 1): Pt will perform toileting at min A overall. OT Short Term Goal 2 (Week 1): Pt will maintain dynamic standing balance with min A during self care tasks. OT Short Term Goal 3 (Week 1): Pt will perform LB dressing with min A overall.  Skilled Therapeutic Interventions/Progress Updates:     Pt greeted in bed, just finished with her PT session and pretty tired. Wanting to wash up a little bit and change her clothes. Verbal cuing for supine<sit for adhering to sternal precautions. CGA for short distance ambulatory transfer to w/c placed near sink using RW. Pt completed UB bathing with setup assist. OT then washed her hair using the hair washing tray which appeared to brighten affect. Dressing completed sit<stand at the sink with min cuing for hand placement during sit<stands. Pt able to utilize figure 4 position to doff and don footwear. She requested to complete oral care and grooming tasks while seated to conserve energy. Pt required setup assistance to complete these tasks. We discussed her bathroom setup at home with pt verbalizing she has both a walk in shower and tub shower, however she wants to use the tub shower at home because the walk in has a shower head that is too high. Pt has a seat, initiated education regarding having a bench instead for safety. She would benefit from practicing TTB transfers. At end of session pt opted to remain sitting up in the w/c to allow her hair to dry. Left her with all needs within reach and safety belt fastened.     Therapy Documentation Precautions:  Precautions Precautions: Sternal Precaution Comments: Demonstrated appropriate sternal precautions Restrictions Weight Bearing  Restrictions: Yes(sternal) RUE Weight Bearing: Partial weight bearing RUE Partial Weight Bearing Percentage or Pounds: (5lbs or less with both upper ext) LUE Weight Bearing: Partial weight bearing LUE Partial Weight Bearing Percentage or Pounds: (less than 5LB) Pain: Min back pain, notified RN at end of session that pt would like a pain patch to address Pain Assessment Pain Scale: 0-10 Pain Score: 0-No pain ADL:        Therapy/Group: Individual Therapy  [place] Brogden A Anayla Giannetti 12/04/2019, 11:37 AM

## 2019-12-04 NOTE — Progress Notes (Signed)
Physical Therapy Session Note  Patient Details  Name: Yolanda Flowers MRN: [id number] Date of Birth: Dec 11, 1940  Today's Date: 12/04/2019 PT Individual Time: 7496-6466 PT Individual Time Calculation (min): 60 min   Short Term Goals: Week 1:  PT Short Term Goal 1 (Week 1): Pt will perform bed<>WC transfers with min assist consistently PT Short Term Goal 2 (Week 1): Pt will ambulate 114f with min assist and LRAD PT Short Term Goal 3 (Week 1): Pt will perform bed mobility with supervision assist  Skilled Therapeutic Interventions/Progress Updates:   Pt received supine in bed and agreeable to PT. Supine>sit transfer with supervisino assist and min cues for sternal precautions.  [facility] training with RW x [id number] [id number] close supervision assist and min cues for step height on the R and improved posture.  PT instructed pt in dynamic balance training with wii sports bowling. Able to maintain standing balance with 1-0 UE support and min assist from PT for safety. Occasional cues for ankle strategy to prevent anterior LOB. 6 frames x 2 frames in standing. BUE NMR to perform 3 rounds of Wii boxing. Min cues for symmetrical use of BUE to improved attention to BUE and increase shoulder ROM/strength.   Pt returned to room and performed ambulatory transfer to bed with supervision assist and RW. Sit>supine completed with supervision assist for safety and left supine in bed with call bell in reach and all needs met.        Therapy Documentation Precautions:  Precautions Precautions: Sternal Precaution Comments: Demonstrated appropriate sternal precautions Restrictions Weight Bearing Restrictions: Yes(sternal) RUE Weight Bearing: Partial weight bearing RUE Partial Weight Bearing Percentage or Pounds: (5lbs or less with both upper ext) LUE Weight Bearing: Partial weight bearing LUE Partial Weight Bearing  Percentage or Pounds: (less than 5LB)    Vital Signs: Therapy Vitals Temp: 98.3 F (36.8 C) Pulse Rate: 76 Resp: 16 BP: 104/66 Patient Position (if appropriate): Lying Oxygen Therapy SpO2: 95 % O2 Device: Room Air Pain: denies   Therapy/Group: Individual Therapy  AusLorie Phenix17/2021, 4:37 PM

## 2019-12-05 ENCOUNTER — Inpatient Hospital Stay (HOSPITAL_COMMUNITY): Payer: PPO | Admitting: Occupational Therapy

## 2019-12-05 MED ORDER — PANTOPRAZOLE SODIUM 40 MG PO TBEC
40.0000 mg | DELAYED_RELEASE_TABLET | Freq: Every day | ORAL | Status: DC
  Administered 2019-12-06 – 2019-12-10 (×5): 40 mg via ORAL
  Filled 2019-12-05 (×5): qty 1

## 2019-12-05 NOTE — Progress Notes (Signed)
Occupational Therapy Session Note  Patient Details  Name: Yolanda Flowers MRN: [id number] Date of Birth: [birth year]  Today's Date: 12/05/2019 OT Individual Time: 1001-1043 OT Individual Time Calculation (min): 42 min   Short Term Goals: Week 1:  OT Short Term Goal 1 (Week 1): Pt will perform toileting at min A overall. OT Short Term Goal 2 (Week 1): Pt will maintain dynamic standing balance with min A during self care tasks. OT Short Term Goal 3 (Week 1): Pt will perform LB dressing with min A overall.  Skilled Therapeutic Interventions/Progress Updates:    Pt greeted in bed with no c/o pain, requesting to start session by using the restroom. Supervision for supine<sit with HOB raised using the bedrail. Close supervision for ambulatory toilet transfer using RW with min vcs for upright posture. Pt had continent B+B void and was able to complete 3/3 aspects of toileting tasks with close supervision for standing balance and assist for brief only. After handwashing at the sink pt transferred to the w/c. Transported her down to the therapy apartment and explained recommendation of TTB at home to increase safety during showering. OT demonstrated TTB transfer with device and then pt practiced herself, requiring supervision assist. Discussed curtain placement to prevent water spillage on floor. Pt reported she has a ridges on the floor of shower to decrease fall risk. When she returned to the w/c OT provided her with a walker bag and demonstrated RW mgt and safety in the kitchen area. Emphasized placing snacks within easy reach in fridge or pantry for sternal precaution adherence. Pt verbalized understanding. She was then returned to the room via w/c, opting to stay up for the time being. Solidified her DME needs for home and role of f/u OT. Left her with all needs within reach and safety belt fastened.   Therapy Documentation Precautions:  Precautions Precautions: Sternal Precaution Comments:  Demonstrated appropriate sternal precautions Restrictions Weight Bearing Restrictions: Yes(sternal precautions) RUE Weight Bearing: Partial weight bearing RUE Partial Weight Bearing Percentage or Pounds: (5lbs or less with both upper ext) LUE Weight Bearing: Partial weight bearing LUE Partial Weight Bearing Percentage or Pounds: (less than 5LB) Vital Signs: Therapy Vitals Temp: 98.7 F (37.1 C) Temp Source: Oral Pulse Rate: 75 Resp: 14 BP: 116/65 Patient Position (if appropriate): Lying Oxygen Therapy SpO2: 96 % O2 Device: Room Air ADL:       Therapy/Group: Individual Therapy  Yolanda Flowers 12/05/2019, 4:26 PM

## 2019-12-05 NOTE — Progress Notes (Signed)
[facility] PHYSICAL MEDICINE & REHABILITATION PROGRESS NOTE   Subjective/Complaints:  Pt reports doing well- no issues EXCEPT receiving protonix (takes prevacid at home) at 8 am AFTER breakfast and with other meds- wants it early in AM 1 hour before other meds/food.   Asked for it ~ 6am.  LBM this AM and feels like needs to go again. Is continent.  ROS:  Pt denies SOB, abd pain, CP, N/V/C/D, and vision changes    Objective:   No results found. Recent Labs    12/03/19 0630  HGB 10.9*  HCT 34.2*   No results for input(s): NA, K, CL, CO2, GLUCOSE, BUN, CREATININE, CALCIUM in the last 72 hours.  Intake/Output Summary (Last 24 hours) at 12/05/2019 1247 Last data filed at 12/05/2019 0728 Gross per 24 hour  Intake 960 ml  Output --  Net 960 ml     Physical Exam: Vital Signs Blood pressure 130/73, pulse 65, temperature 98.1 F (36.7 C), resp. rate 20, height 5\' 6"  (1.676 m), weight 73.8 kg, SpO2 94 %. Nursing note and vitals reviewed. Constitutional: pt sitting up in bed; appropriate, wearing EGs, NAD  HENT:  Head: Mild R facial droop- unchanged  ectropion left due to biopsy -still there- no change L lower lid lag- unchanged L visual fields are absent and also lower 1/2 of L visual field - still affected  Neck: No tracheal deviation present.  Cardiovascular: RRR Chest incision healed from CABG 3/18  Respiratory: CTA b/l- no coughing with speech today (did iniitally) GI: soft, NT, ND; (+)BS  Soft, NT, ND, (+)BS  Musculoskeletal:        General: No edema.     Cervical back: Normal range of motion and neck supple.     Comments: UEs 5-/5 in deltoids, biceps, triceps, WE, grip and finger abd  LEs- appears equal as well- HF 4+/5, KE 4+/5, DF and PF 5-/5 B/L  Neurological: She is alert and oriented to person, place, and time.  Intact to light touch in all 4 extremities Slowed finger to nose B/L- R slightly worse  Skin: Skin is warm and dry.  R anterior calf dark  bruising- also sign of healing from vein harvesting  Right antecubital fossa and loop recorder incision sites healing nicely Psychiatric: appropriate  Assessment/Plan: 1. Functional deficits secondary to embolic stroke which require 3+ hours per day of interdisciplinary therapy in a comprehensive inpatient rehab setting.  Physiatrist is providing close team supervision and 24 hour management of active medical problems listed below.  Physiatrist and rehab team continue to assess barriers to discharge/monitor patient progress toward functional and medical goals  Care Tool:  Bathing    Body parts bathed by patient: Right arm, Left arm, Chest, Abdomen, Front perineal area, Right upper leg, Left upper leg, Face   Body parts bathed by helper: Buttocks, Right lower leg, Left lower leg     Bathing assist Assist Level: Moderate Assistance - Patient 50 - 74%     Upper Body Dressing/Undressing Upper body dressing   What is the patient wearing?: Pull over shirt    Upper body assist Assist Level: Supervision/Verbal cueing    Lower Body Dressing/Undressing Lower body dressing      What is the patient wearing?: Pants     Lower body assist Assist for lower body dressing: Contact Guard/Touching assist     Toileting Toileting Toileting Activity did not occur (Clothing management and hygiene only): N/A (no void or bm)  Toileting assist Assist for toileting: Moderate  Assistance - Patient 50 - 74%     Transfers Chair/bed transfer  Transfers assist     Chair/bed transfer assist level: Supervision/Verbal cueing     Locomotion Ambulation   Ambulation assist      Assist level: Supervision/Verbal cueing Assistive device: Walker-rolling Max distance: 150   Walk 10 feet activity   Assist     Assist level: Supervision/Verbal cueing Assistive device: Walker-rolling   Walk 50 feet activity   Assist Walk 50 feet with 2 turns activity did not occur: Safety/medical  concerns  Assist level: Supervision/Verbal cueing Assistive device: Walker-rolling    Walk 150 feet activity   Assist    Assist level: Supervision/Verbal cueing Assistive device: Walker-rolling    Walk 10 feet on uneven surface  activity   Assist Walk 10 feet on uneven surfaces activity did not occur: Safety/medical concerns         Wheelchair     Assist Will patient use wheelchair at discharge?: No   Wheelchair activity did not occur: Safety/medical concerns(sternal precautions)         Wheelchair 50 feet with 2 turns activity    Assist    Wheelchair 50 feet with 2 turns activity did not occur: Safety/medical concerns       Wheelchair 150 feet activity     Assist  Wheelchair 150 feet activity did not occur: Safety/medical concerns       Blood pressure 130/73, pulse 65, temperature 98.1 F (36.7 C), resp. rate 20, height 5\' 6"  ([id number] m), weight 73.8 kg, SpO2 94 %.  Medical Problem List and Plan: 1.  Impaired Function, ADLs and moblity secondary to B/L embolic strokes- thalamus, corpus callosum, and Basal ganglia             -patient may shower- still has sternal precautions from previous CABG 11/04/19             -ELOS/Goals: 2-2.5 weeks; mod I to Supervision  -Continue CIR PT, OT, SLP 2.  Antithrombotics: -DVT/anticoagulation:  Pharmaceutical: Lovenox             -antiplatelet therapy: ASA to stop after 3 weeks and Plavix to continue.  3. Chronic back pain.Pain Management: Worse with activity--used oxycodone--does not want tramadol "it causes stroke"  4/18-- pain controlled when wears patch- will con't 4. Mood: LCSW to follow for evaluation and support             -antipsychotic agents: N/A 5. Neuropsych: This patient is capable of making decisions on her own behalf. 6. Skin/Wound Care: Routine pressure relief measures. Cellulitis has resolved.  7. Fluids/Electrolytes/Nutrition: Monitor I/O. Check lytes in am. 8. CAD s/p CABG 10/29/19:  Continue sternal precautions.  4/18- Sternal precautions until 01/04/20 9. Allergies: Uses flonase at nights.  10. HTN: Monitor BP tid--continue 11.  Thrombocytosis: Leucocytosis due to hydrea--Vitamin D 50,000 units every 2 weeks. She has been evaluated by Hem/Onc--WBC around 18, 0000 due to hydrea. Will continue to monitor for fevers or other signs of infection.   4/17- WBC down to 16k- con't to monitor 13. Dyslipidemia: On Lipitor 14. Constipation: Had BM 4/14. Added sorbitol PRN  4/18- LBM this AM 15. Hyperkalemia: K+ 5.7 today. Kayexalate 57mL x1. Repeat BMP tomorrow  4/15: K+ has improved to 3.9.  16. Bleeding from loop recorder site: Hgb decreased to 11.3 4/14.   4/15: Hgb continuing to trend downward, ordered repeat Hgb for tomorrow.   4//16: Hgb downtrending slightly but may be due to blood draws. No more bleeding  from wounds.  17. AKI: Cr trending downward.  18. GERD/reflux  4/18- changed protonix to 5:30-6 am in AM so 1 hr away from food and other meds- per ptr request- wants to be woken up to receive it.    LOS: 5 days A FACE TO FACE EVALUATION WAS PERFORMED  Yolanda Flowers 12/05/2019, 12:47 PM

## 2019-12-06 ENCOUNTER — Inpatient Hospital Stay (HOSPITAL_COMMUNITY): Payer: PPO | Admitting: Occupational Therapy

## 2019-12-06 ENCOUNTER — Inpatient Hospital Stay (HOSPITAL_COMMUNITY): Payer: PPO | Admitting: Physical Therapy

## 2019-12-06 LAB — BASIC METABOLIC PANEL
Anion gap: 11 (ref 5–15)
BUN: 12 mg/dL (ref 8–23)
CO2: 23 mmol/L (ref 22–32)
Calcium: 9 mg/dL (ref 8.9–10.3)
Chloride: 106 mmol/L (ref 98–111)
Creatinine, Ser: 1.01 mg/dL — ABNORMAL HIGH (ref 0.44–1.00)
GFR calc Af Amer: >60 mL/min (ref >60)
GFR calc non Af Amer: 53 mL/min — ABNORMAL LOW (ref >60)
Glucose, Bld: 91 mg/dL (ref 70–99)
Potassium: 3.9 mmol/L (ref 3.5–5.1)
Sodium: 140 mmol/L (ref 135–145)

## 2019-12-06 LAB — CBC
HCT: 33.5 % — ABNORMAL LOW (ref 36.0–46.0)
Hemoglobin: 10.6 g/dL — ABNORMAL LOW (ref 12.0–15.0)
MCH: 32.8 pg (ref 26.0–34.0)
MCHC: 31.6 g/dL (ref 30.0–36.0)
MCV: 103.7 fL — ABNORMAL HIGH (ref 80.0–100.0)
Platelets: 188 10*3/uL (ref 150–400)
RBC: 3.23 MIL/uL — ABNORMAL LOW (ref 3.87–5.11)
RDW: 21.6 % — ABNORMAL HIGH (ref 11.5–15.5)
WBC: 14.3 10*3/uL — ABNORMAL HIGH (ref 4.0–10.5)
nRBC: 0.3 % — ABNORMAL HIGH (ref 0.0–0.2)

## 2019-12-06 MED ORDER — METOPROLOL TARTRATE 12.5 MG HALF TABLET
12.5000 mg | ORAL_TABLET | Freq: Two times a day (BID) | ORAL | Status: DC
  Administered 2019-12-06 – 2019-12-07 (×2): 12.5 mg via ORAL
  Filled 2019-12-06 (×2): qty 1

## 2019-12-06 MED ORDER — TRAMADOL HCL 50 MG PO TABS
50.0000 mg | ORAL_TABLET | Freq: Four times a day (QID) | ORAL | Status: DC | PRN
  Administered 2019-12-06 – 2019-12-09 (×5): 50 mg via ORAL
  Filled 2019-12-06 (×5): qty 1

## 2019-12-06 NOTE — Progress Notes (Addendum)
[facility] PHYSICAL MEDICINE & REHABILITATION PROGRESS NOTE   Subjective/Complaints: No complaints this morning. Having regular BM. Still having some bleeding from IV site during dressing change. Placed nursing care order for Thrombo-pad over the site.   ROS: Pt denies SOB, abd pain, CP, N/V/C/D, and vision changes  Objective:   No results found. Recent Labs    12/06/19 0535  WBC 14.3*  HGB 10.6*  HCT 33.5*  PLT 188   Recent Labs    12/06/19 0535  NA 140  K 3.9  CL 106  CO2 23  GLUCOSE 91  BUN 12  CREATININE 1.01*  CALCIUM 9.0    Intake/Output Summary (Last 24 hours) at 12/06/2019 1028 Last data filed at 12/06/2019 0827 Gross per 24 hour  Intake 440 ml  Output --  Net 440 ml     Physical Exam: Vital Signs Blood pressure 126/74, pulse 65, temperature 97.6 F (36.4 C), temperature source Oral, resp. rate 19, height 5\' 6"  (1.676 m), weight 75.3 kg, SpO2 95 %. Nursing note and vitals reviewed. Constitutional: pt sitting up in chair doing self-care with therapy.  HENT:  Head: Mild R facial droop- unchanged  ectropion left due to biopsy -still there- no change L lower lid lag- unchanged L visual fields are absent and also lower 1/2 of L visual field - still affected  Neck: No tracheal deviation present.  Cardiovascular: RRR Chest incision healed from CABG 3/18  Respiratory: CTA b/l- no coughing with speech today (did iniitally) GI: soft, NT, ND; (+)BS  Soft, NT, ND, (+)BS  Musculoskeletal:        General: No edema.     Cervical back: Normal range of motion and neck supple.     Comments: UEs 5-/5 in deltoids, biceps, triceps, WE, grip and finger abd  LEs- appears equal as well- HF 4+/5, KE 4+/5, DF and PF 5-/5 B/L  Neurological: She is alert and oriented to person, place, and time.  Intact to light touch in all 4 extremities Slowed finger to nose B/L- R slightly worse  Skin: Skin is warm and dry.  R anterior calf dark bruising- also sign of healing from  vein harvesting  Right antecubital fossa and loop recorder incision sites healing nicely Psychiatric: appropriate  Assessment/Plan: 1. Functional deficits secondary to embolic stroke which require 3+ hours per day of interdisciplinary therapy in a comprehensive inpatient rehab setting.  Physiatrist is providing close team supervision and 24 hour management of active medical problems listed below.  Physiatrist and rehab team continue to assess barriers to discharge/monitor patient progress toward functional and medical goals  Care Tool:  Bathing    Body parts bathed by patient: Right arm, Left arm, Chest, Abdomen, Front perineal area, Right upper leg, Left upper leg, Face, Buttocks, Right lower leg, Left lower leg   Body parts bathed by helper: Buttocks, Right lower leg, Left lower leg     Bathing assist Assist Level: Contact Guard/Touching assist     Upper Body Dressing/Undressing Upper body dressing   What is the patient wearing?: Pull over shirt    Upper body assist Assist Level: Set up assist    Lower Body Dressing/Undressing Lower body dressing      What is the patient wearing?: Pants, Underwear/pull up     Lower body assist Assist for lower body dressing: Contact Guard/Touching assist     Toileting Toileting Toileting Activity did not occur (Clothing management and hygiene only): N/A (no void or bm)  Toileting assist Assist for  toileting: Moderate Assistance - Patient 50 - 74%     Transfers Chair/bed transfer  Transfers assist     Chair/bed transfer assist level: Supervision/Verbal cueing     Locomotion Ambulation   Ambulation assist      Assist level: Supervision/Verbal cueing Assistive device: Walker-rolling Max distance: 150   Walk 10 feet activity   Assist     Assist level: Supervision/Verbal cueing Assistive device: Walker-rolling   Walk 50 feet activity   Assist Walk 50 feet with 2 turns activity did not occur: Safety/medical  concerns  Assist level: Supervision/Verbal cueing Assistive device: Walker-rolling    Walk 150 feet activity   Assist    Assist level: Supervision/Verbal cueing Assistive device: Walker-rolling    Walk 10 feet on uneven surface  activity   Assist Walk 10 feet on uneven surfaces activity did not occur: Safety/medical concerns         Wheelchair     Assist Will patient use wheelchair at discharge?: No   Wheelchair activity did not occur: Safety/medical concerns(sternal precautions)         Wheelchair 50 feet with 2 turns activity    Assist    Wheelchair 50 feet with 2 turns activity did not occur: Safety/medical concerns       Wheelchair 150 feet activity     Assist  Wheelchair 150 feet activity did not occur: Safety/medical concerns       Blood pressure 126/74, pulse 65, temperature 97.6 F (36.4 C), temperature source Oral, resp. rate 19, height 5\' 6"  (1.676 m), weight 75.3 kg, SpO2 95 %.  Medical Problem List and Plan: 1.  Impaired Function, ADLs and moblity secondary to B/L embolic strokes- thalamus, corpus callosum, and Basal ganglia             -patient may shower- still has sternal precautions from previous CABG 11/04/19             -ELOS/Goals: 2-2.5 weeks; mod I to Supervision  -Continue CIR PT, OT, SLP 2.  Antithrombotics: -DVT/anticoagulation:  Pharmaceutical: Lovenox             -antiplatelet therapy: ASA to stop after 3 weeks and Plavix to continue.  3. Chronic back pain.Pain Management: Worse with activity--used oxycodone--does not want tramadol "it causes stroke"  4/19-- pain controlled when wears patch- will con't 4. Mood: LCSW to follow for evaluation and support             -antipsychotic agents: N/A 5. Neuropsych: This patient is capable of making decisions on her own behalf. 6. Skin/Wound Care: Routine pressure relief measures. Cellulitis has resolved.  7. Fluids/Electrolytes/Nutrition: Monitor I/O. Check lytes in am. 8.  CAD s/p CABG 10/29/19: Continue sternal precautions.  4/18- Sternal precautions until 01/04/20 9. Allergies: Uses flonase at nights.  10. HTN: Monitor BP tid--continue to monitor.  4/19: SBP low; decreased Metoprolol to 12.5mg  BID as HR is well controlled.  11.  Thrombocytosis: Leucocytosis due to hydrea--Vitamin D 50,000 units every 2 weeks. She has been evaluated by Hem/Onc--WBC around 18, 0000 due to hydrea. Will continue to monitor for fevers or other signs of infection.   4/17- WBC down to 16k- con't to monitor  4/19: WBC decreased to 14.3 13. Dyslipidemia: On Lipitor 14. Constipation: Had BM 4/14. Added sorbitol PRN  4/18- LBM this AM 15. Hyperkalemia: K+ 5.7 today. Kayexalate 52mL x1. Repeat BMP tomorrow  4/15: K+ has improved to 3.9.  16. Bleeding from loop recorder and IV sites:  4/19: Still having  some bleeding from IV site during dressing change. Placed nursing care order for Thrombo-pad over the site. Hgb 10.6 this mornig. Trending down slightly.  17. AKI: Cr trending downward. 4/19: normalized.  18. GERD/reflux  4/18- changed protonix to 5:30-6 am in AM so 1 hr away from food and other meds- per ptr request- wants to be woken up to receive it.    LOS: 6 days A FACE TO FACE EVALUATION WAS PERFORMED  Martha Clan P Cathey Fredenburg 12/06/2019, 10:28 AM

## 2019-12-06 NOTE — Progress Notes (Signed)
Physical Therapy Session Note  Patient Details  Name: Yolanda Flowers MRN: [id number] Date of Birth: 1940/12/02  Today's Date: 12/06/2019 PT Individual Time: 1427-6701 PT Individual Time Calculation (min): 55 min   Short Term Goals: Week 1:  PT Short Term Goal 1 (Week 1): Pt will perform bed<>WC transfers with min assist consistently PT Short Term Goal 2 (Week 1): Pt will ambulate 18f with min assist and LRAD PT Short Term Goal 3 (Week 1): Pt will perform bed mobility with supervision assist  Skilled Therapeutic Interventions/Progress Updates: Pt presented in recliner agreeable to therapy. Pt states some mild back discomfort however received lidocaine patch and no additional intervention needed. Pt ambulated to rehab gym with RW and close S and close. After seated rest pt ambulated to high/low mat with notable heavy L lean requiring pt to stop but was able to correct. Pt states just began to feel "wobbly" BP checked 125/85 (98) HR 72. Pt then participate static and dynamic balance activities standing on Airex for increased ankle strategy. Pt also participated in x 2 bouts of horseshoes while standing on Airex for dynamic balance. Pt then participated in horseshoes with single LE on 4in step all requiring CGA. Pt then ambulated without AD x 250fand CGA to Biodex and participated in LOS x 2 rounds and catch game x 1 round with pt demonstrating improving ankle strategy. Pt then ambulated back to room with RW and close S with improved awareness of heel strike and improved overall foot clearance. Pt returned to w/c and requested PTA contact nsg for meds due to increasing back pain. Pt left in w/c with belt alarm on, call bell within reach and needs met.      Therapy Documentation Precautions:  Precautions Precautions: Sternal Precaution Comments: Demonstrated appropriate sternal precautions Restrictions Weight Bearing Restrictions: Yes(sternal precautions) RUE Weight Bearing: Partial weight  bearing RUE Partial Weight Bearing Percentage or Pounds: (5lbs or less with both upper ext) LUE Weight Bearing: Partial weight bearing LUE Partial Weight Bearing Percentage or Pounds: (less than 5LB) General:   Vital Signs:   Pain:   Mobility:   Locomotion :    Trunk/Postural Assessment :    Balance:   Exercises:   Other Treatments:      Therapy/Group: Individual Therapy  Alyze Lauf 12/06/2019, 12:54 PM

## 2019-12-06 NOTE — Progress Notes (Signed)
Occupational Therapy Session Note  Patient Details  Name: Yolanda Flowers MRN: [id number] Date of Birth: 09/12/1940  Today's Date: 12/06/2019 OT Individual Time: [id number] OT Individual Time Calculation (min): 55 min    Short Term Goals: Week 1:  OT Short Term Goal 1 (Week 1): Pt will perform toileting at min A overall. OT Short Term Goal 2 (Week 1): Pt will maintain dynamic standing balance with min A during self care tasks. OT Short Term Goal 3 (Week 1): Pt will perform LB dressing with min A overall. Week 2:     Skilled Therapeutic Interventions/Progress Updates:    Upon entering the room, pt supine in bed with c/o low back pain. RN placed pain patch prior to OT arrival. Pt requesting to wash at sink this session and declines toileting. Supine >sit with supervision. Pt standing from EOB with supervision while maintaining sternal precautions. Pt transferred into wheelchair with supervision overall. Pt performed grooming tasks while seated with supervision. Pt bathing UB while seated with set up A to obtain clean clothing items. Pt standing and washing LB with CGA for balance. Pt utilized figure four position to wash B LEs and feet as well as applying lotion and threading on clean socks. Pt standing with CGA to pull pants and underwear over B hips. OT encouraged pt to remain seated in wheelchair until next therapy session in 45 minutes. Pt agreeable and seated in wheelchair with chair alarm belt donned and activated. Call bell within reach.   Therapy Documentation Precautions:  Precautions Precautions: Sternal Precaution Comments: Demonstrated appropriate sternal precautions Restrictions Weight Bearing Restrictions: Yes(sternal precautions) RUE Weight Bearing: Partial weight bearing RUE Partial Weight Bearing Percentage or Pounds: (5lbs or less with both upper ext) LUE Weight Bearing: Partial weight bearing LUE Partial Weight Bearing Percentage or Pounds: (less than 5LB) General:    Vital Signs:   Pain: Pain Assessment Pain Scale: 0-10 Pain Score: 0-No pain =  Therapy/Group: Individual Therapy  Gypsy Decant 12/06/2019, 9:12 AM

## 2019-12-06 NOTE — Progress Notes (Signed)
Occupational Therapy Session Note  Patient Details  Name: Yolanda Flowers MRN: [id number] Date of Birth: 1941/03/11  Today's Date: 12/06/2019 OT Individual Time: [id number] OT Individual Time Calculation (min): 72 min   Short Term Goals: Week 1:  OT Short Term Goal 1 (Week 1): Pt will perform toileting at min A overall. OT Short Term Goal 2 (Week 1): Pt will maintain dynamic standing balance with min A during self care tasks. OT Short Term Goal 3 (Week 1): Pt will perform LB dressing with min A overall.  Skilled Therapeutic Interventions/Progress Updates:    Pt greeted in bed with c/o 10/10 back pain and teary about this. OT provided emotional support at this time and also found RN. Per RN, pt does not have orders for stronger pain medicine at this time. Pt was agreeable to participate in tx as long as she could stay lying on the heating pad. Printed her out an energy conservation handout and we reviewed the strategies together for ADL/IADL participation at home. Pt is motivated to do more for herself to regain functional independence. She highlighted the strategies that she found most helpful to apply at home. OT then educated pt on the concept of holistic pain mgt, emphasizing how increasing PNS activity can dampen perception of pain. Pt was led through a guided visualization exercise, focusing on diaphragmatic breathing and deep relaxation. Before visualization, pt rated pain as 10/10, post visualization 7/10. Her relative [name] arrived at this time and we discussed using free resources on the Internet for guided visualization recordings. Discussed functional implications for pain and sleep at home. Both verbalized understanding. OT educated [name] on DME recommendation of TTB. Brought her to the therapy apartment space and demonstrated transfer technique. [name] had several questions regarding how to motivate pt to do more for herself vs ask family members to help. We discussed strategies to boost  pts self efficacy at home and why this is important. At end of session we returned to the room and [place] remained sitting with pt. Pt was left with all needs within reach and bed alarm set.   OT also provided pt with lavender scented cotton balls for her pillowcase for relaxation/pain mgt.   Therapy Documentation Precautions:  Precautions Precautions: Sternal Precaution Comments: Demonstrated appropriate sternal precautions Restrictions Weight Bearing Restrictions: Yes(sternal precautions) RUE Weight Bearing: Partial weight bearing RUE Partial Weight Bearing Percentage or Pounds: (5lbs or less with both upper ext) LUE Weight Bearing: Partial weight bearing LUE Partial Weight Bearing Percentage or Pounds: (less than 5LB) Vital Signs: Therapy Vitals Temp: 98 F (36.7 C) Temp Source: Oral Pulse Rate: 73 Resp: 16 BP: 97/60 Patient Position (if appropriate): Lying Oxygen Therapy SpO2: 96 % O2 Device: Room Air ADL:        Therapy/Group: Individual Therapy  Jeffrey Graefe A Elvert Cumpton 12/06/2019, 4:03 PM

## 2019-12-07 ENCOUNTER — Inpatient Hospital Stay (HOSPITAL_COMMUNITY): Payer: PPO | Admitting: Physical Therapy

## 2019-12-07 ENCOUNTER — Inpatient Hospital Stay (HOSPITAL_COMMUNITY): Payer: PPO | Admitting: Speech Pathology

## 2019-12-07 ENCOUNTER — Ambulatory Visit: Payer: PPO

## 2019-12-07 ENCOUNTER — Inpatient Hospital Stay (HOSPITAL_COMMUNITY): Payer: PPO | Admitting: Occupational Therapy

## 2019-12-07 MED ORDER — METOPROLOL TARTRATE 12.5 MG HALF TABLET
12.5000 mg | ORAL_TABLET | Freq: Every day | ORAL | Status: DC
  Administered 2019-12-08 – 2019-12-10 (×3): 12.5 mg via ORAL
  Filled 2019-12-07 (×3): qty 1

## 2019-12-07 NOTE — Progress Notes (Signed)
Speech Language Pathology Daily Session Note  Patient Details  Name: Yolanda Flowers MRN: [id number] Date of Birth: Apr 25, 1941  Today's Date: 12/07/2019 SLP Individual Time: [id number] SLP Individual Time Calculation (min): 45 min  Short Term Goals: Week 1: SLP Short Term Goal 1 (Week 1): Pt will demonstate recall of new and daily information with Supervision A cues for use of aids or strategies for short term recall. SLP Short Term Goal 2 (Week 1): Pt will demonstate anticipatory awareness by identifying 2 tasks/activities she will require assistance with at home with Supervision A verbal cues. SLP Short Term Goal 3 (Week 1): Pt will demonstrate ability to problem solving mildly complex to complex functional situations with Supervision A verbal/visual cues.  Skilled Therapeutic Interventions: Pt was seen for skilled ST targeting cognitive goals. In functional conversation, pt anticipated impact of her current cognitive and physical impairments at home but identifying 4 specific activities she will require assistance with Mod I. She also completed a semi-complex checkbook activity with Supervision A verbal and visual cues for problem solving. An additional more complex checkbook balancing task was started, during which she was Mod I for complex problem solving and 1 verbal cue was provided for organization. Due to time constraints the task was not completed, however will target completion in next available session. Pt left laying in bed with alarm set and needs within reach. Continue per current plan of care.        Pain Pain Assessment Pain Scale: 0-10 Pain Score: 0-No pain   Therapy/Group: Individual Therapy  Arbutus Leas 12/07/2019, 12:59 PM

## 2019-12-07 NOTE — Plan of Care (Signed)
  Problem: RH BLADDER ELIMINATION Goal: RH STG MANAGE BLADDER WITH ASSISTANCE Description: STG Manage Bladder With Assistance, Min Outcome: Progressing   Problem: RH SKIN INTEGRITY Goal: RH STG SKIN FREE OF INFECTION/BREAKDOWN Description: Free of breakdown and infection with min assist Outcome: Progressing Goal: RH STG MAINTAIN SKIN INTEGRITY WITH ASSISTANCE Description: STG Maintain Skin Integrity With Assistance. Min Outcome: Progressing Goal: RH STG ABLE TO PERFORM INCISION/WOUND CARE W/ASSISTANCE Description: STG Able To Perform Incision/Wound Care With Assistance. Min Outcome: Progressing   Problem: RH SAFETY Goal: RH STG ADHERE TO SAFETY PRECAUTIONS W/ASSISTANCE/DEVICE Description: STG Adhere to Safety Precautions With Assistance/Device. Mod I Outcome: Progressing Goal: RH STG DECREASED RISK OF FALL WITH ASSISTANCE Description: STG Decreased Risk of Fall With mod I Assistance. Outcome: Progressing   Problem: RH PAIN MANAGEMENT Goal: RH STG PAIN MANAGED AT OR BELOW PT'S PAIN GOAL Description: Mod I Outcome: Progressing   Problem: RH KNOWLEDGE DEFICIT Goal: RH STG INCREASE KNOWLEDGE OF HYPERTENSION Description: Patient will be able to describe management of hypertension including medication, diet and exercise with cues, handouts Outcome: Progressing Goal: RH STG INCREASE KNOWLEGDE OF HYPERLIPIDEMIA Description: Patient will be able to describe management of hyperlipidemia with cues, handouts Outcome: Progressing Goal: RH STG INCREASE KNOWLEDGE OF STROKE PROPHYLAXIS Description: Patient will be able to describe prevention measures against future stroke with cues, handouts Outcome: Progressing

## 2019-12-07 NOTE — Progress Notes (Signed)
Occupational Therapy Session Note  Patient Details  Name: Yolanda Flowers MRN: [id number] Date of Birth: [birth year]  Today's Date: 12/07/2019 OT Individual Time: 1100-1208 OT Individual Time Calculation (min): 68 min    Short Term Goals: Week 1:  OT Short Term Goal 1 (Week 1): Pt will perform toileting at min A overall. OT Short Term Goal 2 (Week 1): Pt will maintain dynamic standing balance with min A during self care tasks. OT Short Term Goal 3 (Week 1): Pt will perform LB dressing with min A overall.  Skilled Therapeutic Interventions/Progress Updates:    Upon entering the room, pt seated in recliner chair and reports, " My back is hurting so much." OT assisted pt with changing position and pt standing from chair with supervision and ambulating into bathroom with supervision level and RW. Pt perform transfer, hygiene, and clothing management with CGA for safety with balance. Pt standing at sink for hand hygiene with supervision as well before returning to wheelchair. OT assisted pt to day room via wheelchair for time management. Focus on R grip strength and coordination with use of red resistive theraputty. Pt given paper handout of exercises and needing min cuing for proper technique. Pt returning to room via wheelchair and transferred into bed with CGA stand pivot transfer for pain management. Sit >supine with close supervision and use of bed rail. Call bell and all needed items within reach. Bed alarm activated.   Therapy Documentation Precautions:  Precautions Precautions: Sternal Precaution Comments: Demonstrated appropriate sternal precautions Restrictions Weight Bearing Restrictions: Yes RUE Weight Bearing: Partial weight bearing RUE Partial Weight Bearing Percentage or Pounds: (5lbs or less with both upper ext) LUE Weight Bearing: Partial weight bearing LUE Partial Weight Bearing Percentage or Pounds: (less than 5LB) General:   Vital Signs: Therapy Vitals Pulse Rate:  72 BP: 117/73 Pain: Pain Assessment Pain Scale: 0-10 Pain Score: 7  Pain Type: Acute pain Pain Location: Back Pain Descriptors / Indicators: Aching Pain Onset: Gradual Pain Intervention(s): Medication (See eMAR)   Therapy/Group: Individual Therapy  Gypsy Decant 12/07/2019, 12:40 PM

## 2019-12-07 NOTE — Progress Notes (Signed)
Physical Therapy Session Note  Patient Details  Name: DEANNA WIATER MRN: [id number] Date of Birth: 10-Jan-1941  Today's Date: 12/07/2019 PT Individual Time: 805-845 and 1305-1400 PT Individual Time Calculation (min):40 min and 55 min   Short Term Goals: Week 1:  PT Short Term Goal 1 (Week 1): Pt will perform bed<>WC transfers with min assist consistently PT Short Term Goal 2 (Week 1): Pt will ambulate 172f with min assist and LRAD PT Short Term Goal 3 (Week 1): Pt will perform bed mobility with supervision assist  Skilled Therapeutic Interventions/Progress Updates: Tx1: Pt presented in bed agreeable to therapy. Pt states feels much better today as received pain meds. Pt performed bed mobility with use of bed features with supervision. Performed STS supervision from bed and ambulated to day room close S with RW. Pt noted to have improved R foot clearance and heel strike this session. Pt participated in obstacle course 260fx 2 with RW including stepping over threshold, weaving through cones, and stepping on compliant surface. Pt was able to complete obstacle course with close S however when turning back to mat pt with LOB requiring minA from PTA for recovery. Pt states felt that  LLE got "weak", PTA observed that during turn pt's LLE was not positioned when taking step with RLE to align with mat. Discussed with pt being cognizant of LLE when multiple distractions and "multitasking". Provided and participated in [place]" exercises x 10 bilaterally with pt requiring min cues for technique for mini squats and requiring rest breaks between activities due to fatigue. Pt ambulated back to room at end of session and agreeable to try sitting in recliner vs returning to bed. Pt left in recliner at end of session with seat alarm on, call bell within reach and current needs met.   Tx2: Pt presented in bed agreeable to therapy. Pt states received pain meds and back is feeling much better. Performed bed mobility  with supervision and use of bed features. PTA donned shoes for time management. Pt performed stand pivot transfer to w/c CGA no AD. [facility] and pt participated in gait in courtyard with RW. Pt demonstrating good step length, heel strike and safety with RW. Pt ambulated bouts of 150-20020fith RW with seated rests between bouts. Pt ambulated on small grades and uneven surfaces. Pt ambulated at close S level but when fatigued CGA however pt did not demonstrate any instability nor LOB with ambulation. Pt then transported to atrium and ambulated in gift shop with RW close S throughout. Pt transported back to unit and performed bed mobility in ADL apt from standard bed. Pt was able to performed sit to/from supine maintaining sternal precautions with supervision which even surprised pt as she had been using hospital bed at home. Pt returned to room at end of session and performed stand pivot to bed close S. Performed bed mobility supervision with bed features and pt left with bed alarm on, call bell within reach and needs met.      Therapy Documentation Precautions:  Precautions Precautions: Sternal Precaution Comments: Demonstrated appropriate sternal precautions Restrictions Weight Bearing Restrictions: Yes RUE Weight Bearing: Partial weight bearing RUE Partial Weight Bearing Percentage or Pounds: (5lbs or less with both upper ext) LUE Weight Bearing: Partial weight bearing LUE Partial Weight Bearing Percentage or Pounds: (less than 5LB) General:   Vital Signs: Therapy Vitals Temp: 98.3 F (36.8 C) Pulse Rate: 78 Resp: 17 BP: 112/78 Patient Position (if appropriate): Lying Oxygen  Therapy SpO2: 98 % O2 Device: Room Air Pain: Pain Assessment Pain Scale: 0-10 Pain Score: Asleep Pain Type: Acute pain Pain Location: Back Pain Descriptors / Indicators: Aching Pain Onset: Gradual Pain Intervention(s): Medication (See eMAR)   Therapy/Group: Individual  Therapy  Rosita DeChalus  Rosita DeChalus, PTA  12/07/2019, 2:47 PM

## 2019-12-07 NOTE — Progress Notes (Signed)
TCTS Subjective:  Feels well.  She thinks she is making good progress with her therapies.  Objective: Vital signs in last 24 hours: Temp:  [98.3 F (36.8 C)-98.8 F (37.1 C)] 98.3 F (36.8 C) (04/20 1256) Pulse Rate:  [71-78] 78 (04/20 1256) Resp:  [16-17] 17 (04/20 1256) BP: (112-125)/(70-79) 112/78 (04/20 1256) SpO2:  [96 %-98 %] 98 % (04/20 1256) Weight:  [74 kg] 74 kg (04/20 0600)  Hemodynamic parameters for last 24 hours:    Intake/Output from previous day: 04/19 0701 - 04/20 0700 In: 666 [P.O.:666] Out: -  Intake/Output this shift: Total I/O In: 430 [P.O.:430] Out: -   General appearance: alert and cooperative Heart: regular rate and rhythm, S1, S2 normal, no murmur, click, rub or gallop Lungs: clear to auscultation bilaterally Extremities: no edema. Ecchymosis right leg from vein harvest and small hematoma superior to vein harvest incision but no redness or tenderness. Wound: incisions ok.  Lab Results: Recent Labs    12/06/19 0535  WBC 14.3*  HGB 10.6*  HCT 33.5*  PLT 188   BMET:  Recent Labs    12/06/19 0535  NA 140  K 3.9  CL 106  CO2 23  GLUCOSE 91  BUN 12  CREATININE 1.01*  CALCIUM 9.0    PT/INR: No results for input(s): LABPROT, INR in the last 72 hours. ABG    Component Value Date/Time   PHART 7.373 11/04/2019 2216   HCO3 22.5 11/04/2019 2216   TCO2 24 11/04/2019 2216   ACIDBASEDEF 3.0 (H) 11/04/2019 2216   O2SAT 83.0 11/04/2019 2216   CBG (last 3)  No results for input(s): GLUCAP in the last 72 hours.  Assessment/Plan:  She continues to make good progress with rehab. Healing well from surgical standpoint. I will arrange followup in our office after discharge from rehab.  LOS: 7 days    Gaye Pollack 12/07/2019

## 2019-12-07 NOTE — Progress Notes (Addendum)
   In patient ILR wound check.   Steri strips removed. Wound without hematoma or bleeding. Minimal ecchymosis.    No episodes. R waves 0.51 mV. Device transmitting appropriately.   Pt may shower from our perspective. Do not let water hit directly on the scab. Keep area clean and dry. No lotions, ointments, creams, or powders until the area is fully healed.   Legrand Como [address]" [place], PA-C  12/07/2019 7:52 AM

## 2019-12-07 NOTE — Progress Notes (Signed)
[place] PHYSICAL MEDICINE & REHABILITATION PROGRESS NOTE   Subjective/Complaints: No complaints this morning. Having regular BM. Back pain is much improved with Tylenol. No further bleeding from loop recorder and IV sites  ROS: Pt denies SOB, abd pain, CP, N/V/C/D, and vision changes  Objective:   No results found. Recent Labs    12/06/19 0535  WBC 14.3*  HGB 10.6*  HCT 33.5*  PLT 188   Recent Labs    12/06/19 0535  NA 140  K 3.9  CL 106  CO2 23  GLUCOSE 91  BUN 12  CREATININE 1.01*  CALCIUM 9.0    Intake/Output Summary (Last 24 hours) at 12/07/2019 1105 Last data filed at 12/07/2019 0732 Gross per 24 hour  Intake 666 ml  Output --  Net 666 ml     Physical Exam: Vital Signs Blood pressure 117/73, pulse 72, temperature 98.3 F (36.8 C), temperature source Oral, resp. rate 16, height 5\' 6"  (1.676 m), weight 74 kg, SpO2 97 %. Nursing note and vitals reviewed. Doing squats against wall in gym with PT Constitutional: pt sitting up in chair doing self-care with therapy.  HENT:  Head: Mild R facial droop- unchanged  ectropion left due to biopsy -still there- no change L lower lid lag- unchanged L visual fields are absent and also lower 1/2 of L visual field - still affected  Neck: No tracheal deviation present.  Cardiovascular: RRR Chest incision healed from CABG 3/18  Respiratory: CTA b/l- no coughing with speech today (did iniitally) GI: soft, NT, ND; (+)BS  Soft, NT, ND, (+)BS  Musculoskeletal:        General: No edema.     Cervical back: Normal range of motion and neck supple.     Comments: UEs 5-/5 in deltoids, biceps, triceps, WE, grip and finger abd  LEs- appears equal as well- HF 4+/5, KE 4+/5, DF and PF 5-/5 B/L  Neurological: She is alert and oriented to person, place, and time.  Intact to light touch in all 4 extremities Slowed finger to nose B/L- R slightly worse  Skin: Skin is warm and dry.  R anterior calf dark bruising- also sign of  healing from vein harvesting  Right antecubital fossa and loop recorder incision sites healing nicely Psychiatric: appropriate  Assessment/Plan: 1. Functional deficits secondary to embolic stroke which require 3+ hours per day of interdisciplinary therapy in a comprehensive inpatient rehab setting.  Physiatrist is providing close team supervision and 24 hour management of active medical problems listed below.  Physiatrist and rehab team continue to assess barriers to discharge/monitor patient progress toward functional and medical goals  Care Tool:  Bathing    Body parts bathed by patient: Right arm, Left arm, Chest, Abdomen, Front perineal area, Right upper leg, Left upper leg, Face, Buttocks, Right lower leg, Left lower leg   Body parts bathed by helper: Buttocks     Bathing assist Assist Level: Contact Guard/Touching assist     Upper Body Dressing/Undressing Upper body dressing   What is the patient wearing?: Pull over shirt    Upper body assist Assist Level: Set up assist    Lower Body Dressing/Undressing Lower body dressing      What is the patient wearing?: Pants, Underwear/pull up     Lower body assist Assist for lower body dressing: Contact Guard/Touching assist     Toileting Toileting Toileting Activity did not occur (Clothing management and hygiene only): N/A (no void or bm)  Toileting assist Assist for toileting: Moderate  Assistance - Patient 50 - 74%     Transfers Chair/bed transfer  Transfers assist     Chair/bed transfer assist level: Supervision/Verbal cueing     Locomotion Ambulation   Ambulation assist      Assist level: Supervision/Verbal cueing Assistive device: Walker-rolling Max distance: 150   Walk 10 feet activity   Assist     Assist level: Supervision/Verbal cueing Assistive device: Walker-rolling   Walk 50 feet activity   Assist Walk 50 feet with 2 turns activity did not occur: Safety/medical concerns  Assist  level: Supervision/Verbal cueing Assistive device: Walker-rolling    Walk 150 feet activity   Assist    Assist level: Supervision/Verbal cueing Assistive device: Walker-rolling    Walk 10 feet on uneven surface  activity   Assist Walk 10 feet on uneven surfaces activity did not occur: Safety/medical concerns         Wheelchair     Assist Will patient use wheelchair at discharge?: No   Wheelchair activity did not occur: Safety/medical concerns(sternal precautions)         Wheelchair 50 feet with 2 turns activity    Assist    Wheelchair 50 feet with 2 turns activity did not occur: Safety/medical concerns       Wheelchair 150 feet activity     Assist  Wheelchair 150 feet activity did not occur: Safety/medical concerns       Blood pressure 117/73, pulse 72, temperature 98.3 F (36.8 C), temperature source Oral, resp. rate 16, height 5\' 6"  (1.676 m), weight 74 kg, SpO2 97 %.  Medical Problem List and Plan: 1.  Impaired Function, ADLs and moblity secondary to B/L embolic strokes- thalamus, corpus callosum, and Basal ganglia             -patient may shower- still has sternal precautions from previous CABG 11/04/19             -ELOS/Goals: 2-2.5 weeks; mod I to Supervision  -Continue CIR PT, OT, SLP 2.  Antithrombotics: -DVT/anticoagulation:  Pharmaceutical: Lovenox             -antiplatelet therapy: ASA to stop after 3 weeks and Plavix to continue.  3. Chronic back pain.Pain Management: Worse with activity--used oxycodone--does not want tramadol "it causes stroke"  4/20: PRN tramadol added for moderate-severe low back pain; greatly helped with pain 4. Mood: LCSW to follow for evaluation and support             -antipsychotic agents: N/A 5. Neuropsych: This patient is capable of making decisions on her own behalf. 6. Skin/Wound Care: Routine pressure relief measures. Cellulitis has resolved.  7. Fluids/Electrolytes/Nutrition: Monitor I/O. Check lytes  in am. 8. CAD s/p CABG 10/29/19: Continue sternal precautions.  4/18- Sternal precautions until 01/04/20 9. Allergies: Uses flonase at nights.  10. HTN: Monitor BP tid--continue to monitor.  4/19: SBP low; decreased Metoprolol to 12.5mg  BID as HR is well controlled.   4/20: SBP better this morning with decrease in Metoprolol, but still on low side. Will decrease to 12.5mg  daily. HR is well controlled.  11.  Thrombocytosis: Leucocytosis due to hydrea--Vitamin D 50,000 units every 2 weeks. She has been evaluated by Hem/Onc--WBC around 18, 0000 due to hydrea. Will continue to monitor for fevers or other signs of infection.   4/17- WBC down to 16k- con't to monitor  4/19: WBC decreased to 14.3 13. Dyslipidemia: On Lipitor 14. Constipation: Had BM 4/14. Added sorbitol PRN  4/18- LBM this AM 15. Hyperkalemia: K+  5.7 today. Kayexalate 72mL x1. Repeat BMP tomorrow  4/15: K+ has improved to 3.9.  16. Bleeding from loop recorder and IV sites:  4/19: Still having some bleeding from IV site during dressing change. Placed nursing care order for Thrombo-pad over the site. Hgb 10.6 this mornig. Trending down slightly  4//20: no further bleeding; continue to monitor.  17. AKI: Cr trending downward. 4/19: normalized.  18. GERD/reflux  4/18- changed protonix to 5:30-6 am in AM so 1 hr away from food and other meds- per ptr request- wants to be woken up to receive it.    LOS: 7 days A FACE TO FACE EVALUATION WAS PERFORMED  Kayton Dunaj P Dalonda Simoni 12/07/2019, 11:05 AM

## 2019-12-08 ENCOUNTER — Inpatient Hospital Stay (HOSPITAL_COMMUNITY): Payer: PPO | Admitting: Speech Pathology

## 2019-12-08 ENCOUNTER — Inpatient Hospital Stay (HOSPITAL_COMMUNITY): Payer: PPO | Admitting: Physical Therapy

## 2019-12-08 ENCOUNTER — Inpatient Hospital Stay (HOSPITAL_COMMUNITY): Payer: PPO | Admitting: Occupational Therapy

## 2019-12-08 ENCOUNTER — Ambulatory Visit: Payer: PPO | Admitting: Surgery

## 2019-12-08 NOTE — Progress Notes (Signed)
[place] PHYSICAL MEDICINE & REHABILITATION PROGRESS NOTE   Subjective/Complaints: Having regular BM. Back pain is much improved with Tramadol but she reports slight hand tremors this morning bilaterally.  No further bleeding from loop recorder and IV sites  ROS: Pt denies SOB, abd pain, CP, N/V/C/D, and vision changes  Objective:   No results found. Recent Labs    12/06/19 0535  WBC 14.3*  HGB 10.6*  HCT 33.5*  PLT 188   Recent Labs    12/06/19 0535  NA 140  K 3.9  CL 106  CO2 23  GLUCOSE 91  BUN 12  CREATININE 1.01*  CALCIUM 9.0    Intake/Output Summary (Last 24 hours) at 12/08/2019 1008 Last data filed at 12/08/2019 0840 Gross per 24 hour  Intake 650 ml  Output --  Net 650 ml     Physical Exam: Vital Signs Blood pressure 132/78, pulse 88, temperature 97.9 F (36.6 C), resp. rate 16, height 5\' 6"  (1.676 m), weight 73.3 kg, SpO2 96 %. Nursing note and vitals reviewed. Seen in wheelchair in hallway prior to therapy session in gym HENT:  Head: Mild R facial droop- unchanged  ectropion left due to biopsy, stable L lower lid lag- unchanged L visual fields are absent and also lower 1/2 of L visual field - still affected  Neck: No tracheal deviation present.  Cardiovascular: RRR Chest incision healed from CABG 3/18  Respiratory: CTA b/l- no coughing with speech today (did iniitally) GI: soft, NT, ND; (+)BS  Soft, NT, ND, (+)BS  Musculoskeletal:        General: No edema.     Cervical back: Normal range of motion and neck supple.     Comments: UEs 5-/5 in deltoids, biceps, triceps, WE, grip and finger abd LEs- appears equal as well- HF 4+/5, KE 4+/5, DF and PF 5-/5 B/L  Neurological: She is alert and oriented to person, place, and time.  Intact to light touch in all 4 extremities Slowed finger to nose B/L- R slightly worse  Skin: Skin is warm and dry.  R anterior calf dark bruising- also sign of healing from vein harvesting  Right antecubital fossa and  loop recorder incision sites healing nicely Psychiatric: appropriate  Assessment/Plan: 1. Functional deficits secondary to embolic stroke which require 3+ hours per day of interdisciplinary therapy in a comprehensive inpatient rehab setting.  Physiatrist is providing close team supervision and 24 hour management of active medical problems listed below.  Physiatrist and rehab team continue to assess barriers to discharge/monitor patient progress toward functional and medical goals  Care Tool:  Bathing    Body parts bathed by patient: Right arm, Left arm, Chest, Abdomen, Front perineal area, Right upper leg, Left upper leg, Face, Buttocks, Right lower leg, Left lower leg   Body parts bathed by helper: Buttocks     Bathing assist Assist Level: Supervision/Verbal cueing     Upper Body Dressing/Undressing Upper body dressing   What is the patient wearing?: Pull over shirt    Upper body assist Assist Level: Set up assist    Lower Body Dressing/Undressing Lower body dressing      What is the patient wearing?: Pants, Underwear/pull up     Lower body assist Assist for lower body dressing: Supervision/Verbal cueing     Toileting Toileting Toileting Activity did not occur (Clothing management and hygiene only): N/A (no void or bm)  Toileting assist Assist for toileting: Supervision/Verbal cueing     Transfers Chair/bed transfer  Transfers assist  Chair/bed transfer assist level: Supervision/Verbal cueing     Locomotion Ambulation   Ambulation assist      Assist level: Supervision/Verbal cueing Assistive device: Walker-rolling Max distance: 150   Walk 10 feet activity   Assist     Assist level: Supervision/Verbal cueing Assistive device: Walker-rolling   Walk 50 feet activity   Assist Walk 50 feet with 2 turns activity did not occur: Safety/medical concerns  Assist level: Supervision/Verbal cueing Assistive device: Walker-rolling    Walk 150  feet activity   Assist    Assist level: Supervision/Verbal cueing Assistive device: Walker-rolling    Walk 10 feet on uneven surface  activity   Assist Walk 10 feet on uneven surfaces activity did not occur: Safety/medical concerns         Wheelchair     Assist Will patient use wheelchair at discharge?: No   Wheelchair activity did not occur: Safety/medical concerns(sternal precautions)         Wheelchair 50 feet with 2 turns activity    Assist    Wheelchair 50 feet with 2 turns activity did not occur: Safety/medical concerns       Wheelchair 150 feet activity     Assist  Wheelchair 150 feet activity did not occur: Safety/medical concerns       Blood pressure 132/78, pulse 88, temperature 97.9 F (36.6 C), resp. rate 16, height 5\' 6"  (1.676 m), weight 73.3 kg, SpO2 96 %.  Medical Problem List and Plan: 1.  Impaired Function, ADLs and moblity secondary to B/L embolic strokes- thalamus, corpus callosum, and Basal ganglia             -patient may shower- still has sternal precautions from previous CABG 11/04/19             -ELOS/Goals: 2-2.5 weeks; mod I to Supervision  -Continue CIR PT, OT, SLP 2.  Antithrombotics: -DVT/anticoagulation:  Pharmaceutical: Lovenox             -antiplatelet therapy: ASA to stop after 3 weeks and Plavix to continue.  3. Chronic back pain.Pain Management: Worse with activity--used oxycodone--does not want tramadol "it causes stroke"  4/20: PRN tramadol added for moderate-severe low back pain; greatly helped with pain  4/21: Experiencing some slight bilateral hand tremors this morning, educated that may be secondary to Tramadol. She feels Tramadol is really helping with her back pain so she would like to try one more dose.  4. Mood: LCSW to follow for evaluation and support             -antipsychotic agents: N/A 5. Neuropsych: This patient is capable of making decisions on her own behalf. 6. Skin/Wound Care: Routine  pressure relief measures. Cellulitis has resolved.  7. Fluids/Electrolytes/Nutrition: Monitor I/O. Check lytes in am. 8. CAD s/p CABG 10/29/19: Continue sternal precautions.  4/18- Sternal precautions until 01/04/20 9. Allergies: Uses flonase at nights.  10. HTN: Monitor BP tid--continue to monitor.  4/19: SBP low; decreased Metoprolol to 12.5mg  BID as HR is well controlled.   4/20: SBP better this morning with decrease in Metoprolol, but still on low side. Will decrease to 12.5mg  daily. HR is well controlled.   4/21: SBP better controlled this morning.  11.  Thrombocytosis: Leucocytosis due to hydrea--Vitamin D 50,000 units every 2 weeks. She has been evaluated by Hem/Onc--WBC around 18, 0000 due to hydrea. Will continue to monitor for fevers or other signs of infection.   4/17- WBC down to 16k- con't to monitor  4/19: WBC decreased  to 14.3 13. Dyslipidemia: On Lipitor 14. Constipation: Had BM 4/14. Added sorbitol PRN  4/18- LBM this AM 15. Hyperkalemia: K+ 5.7 today. Kayexalate 32mL x1. Repeat BMP tomorrow  4/15: K+ has improved to 3.9.  16. Bleeding from loop recorder and IV sites:  4/19: Still having some bleeding from IV site during dressing change. Placed nursing care order for Thrombo-pad over the site. Hgb 10.6 this mornig. Trending down slightly  4//21: no further bleeding; continue to monitor.  17. AKI: Cr trending downward. 4/19: normalized.  18. GERD/reflux  4/18- changed protonix to 5:30-6 am in AM so 1 hr away from food and other meds- per ptr request- wants to be woken up to receive it.    LOS: 8 days A FACE TO FACE EVALUATION WAS PERFORMED  Noga Fogg P Fadia Marlar 12/08/2019, 10:08 AM

## 2019-12-08 NOTE — Patient Care Conference (Signed)
Inpatient RehabilitationTeam Conference and Plan of Care Update Date: 12/08/2019   Time: 10:20 AM    Patient Name: Yolanda Flowers      Medical Record Number: [id number]  Date of Birth: 12-01-1940 Sex: Female         Room/Bed: 4W22C/4W22C-01 Payor Info: Payor: [employer] AD[employer]ANTAGE / Plan: [employer] Must PPO / Product Type: *No Product type* /    Admit Date/Time:  11/30/2019  5:18 PM  Primary Diagnosis:  Embolic stroke [facility])  Patient Active Problem List   Diagnosis Date Noted  . Embolic stroke ([place]) 99991111  . Cellulitis of right leg   . Abdominal pain   . Stroke due to embolism ([place]) 11/25/2019  . S/P CABG x 4 11/04/2019  . CAD (coronary artery disease) 10/25/2019  . Accelerating angina ([place]) 08/26/2019  . Vitamin D deficiency 08/24/2019  . Combined hyperlipidemia 08/24/2019  . Body mass index 29.0-29.9, adult 08/24/2019  . Vitamin B 12 deficiency 08/24/2019  . Malignant melanoma of great toe ([place]) 08/24/2019  . Pedal edema 08/24/2019  . Skin cancer 08/24/2019  . Squamous cell carcinoma, scalp/neck 08/24/2019  . Ischial bursitis, right 08/24/2019  . Swelling of limb 08/24/2019  . Renal insufficiency 08/24/2019  . Atherosclerosis of arteries 08/24/2019  . Dyspnea 02/09/2019  . Solitary pulmonary nodule on lung CT 02/09/2019  . Gastrointestinal bleeding 06/23/2018  . VIN III (vulvar intraepithelial neoplasia III) 10/09/2015  . Melanoma in situ ([place]) 06/29/2014  . Essential thrombocytosis ([place]) 06/22/2014  . COPD (chronic obstructive pulmonary disease) ([place]) 06/21/2014  . HTN (hypertension) 06/21/2014  . Allergic rhinitis 06/21/2014  . GERD (gastroesophageal reflux disease) 06/21/2014    Expected Discharge Date: Expected Discharge Date: 12/10/19  Team Members Present: Physician leading conference: Dr. Leeroy Cha Care Coodinator Present: Nestor Lewandowsky, RN, BSN, CRRN;Genie Lachlan Pelto, RN, MSN Nurse Present: Other (comment)(Marie Poynter, RN) PT Present: Barrie Folk, PT;Rosita Dechalus, PTA OT Present: Darleen Crocker, OT SLP Present: Jettie Booze, CF-SLP PPS Coordinator present : Ileana Ladd, PT     Current Status/Progress Goal Weekly Team Focus  Bowel/Bladder   Patient is continent of bowel and bladder. LBM 12/07/19  Pt will remain continent of b/b with normal bowel pattern  q 2h toileting and PRN   Swallow/Nutrition/ Hydration             ADL's   supervision for UB self care, grooming, and sit <>Stand. Pt CGA overall for all other tasks with use of RW for safety  supervision overall  self care retraining, balance, endurance, strengthening, d/c planning   Mobility   supervision w/c mobilty, supervision transfers, CGA gait with RW monitoring decreased R foot clearance and improved heel strike  mod I transfers supervision assist gait  balance, endurance, RLE strengthening, d/c planning   Communication             Safety/Cognition/ Behavioral Observations  Supervision-Mod I complex problem solving, anticipatory awareness, Supervision-Min memory strategies  Supervision-Mod I  compensatory memory strategies, complex problem solving   Pain   patient denies pain  Pt will be pain free  assess pain q shift and PRN   Skin   Patient has MASD in buttocks, skin tear in R arm, scab mid sternum surgical incision. No sign of infection or drainage  Pt skin will be free of further breakdown/infection  Assess skin q shift and PRN    Rehab Goals Patient on target to meet rehab goals: Yes *See Care Plan and progress notes for long and short-term goals.  Barriers to Discharge  Current Status/Progress Possible Resolutions Date Resolved   Nursing                  PT                    OT                  SLP                SW Decreased caregiver support 1 level single step entry , has shower seat, RW, SPC, walk in shower and daughter can assist at discharge Niece to come in for a week at discharge to assist daughter with patient's care           Discharge Planning/Teaching Needs:  Home with daughter and niece  [place], transfers, B+D, medications, etc   Team Discussion: MD bleeding from loop recorder site, monitoring HGB, LBP, tramadol for pain, tremors, monitoring labs.  RN cont B/B, BM yest, A+O, tramadol for pain.  OT S/CGA self care and transfers, goals S, fam ed is scheduled.  PT mod I bed, S gait RW, went outside yesterday.  SLP goal level mod I comp prob solving, S memory.   Revisions to Treatment Plan: N/A     Medical Summary Current Status: Medically quite stable, good strength throughout, having regular BM, Cr has stabilized, no longer bleeding from loop recorder site, slight bilateral hand tremor today which may be secondary to Tramadol Weekly Focus/Goal: Continued intensive therapies, monitor loop recorder and right IV site for breathing, monitor tremor while on Tramadol- may have to consider other agents but she is limited in options  Barriers to Discharge: Medical stability   Possible Resolutions to Barriers: Caregiver training of daughter, continued intensive therapies so patient can obtain supervision level upon discharge.   Continued Need for Acute Rehabilitation Level of Care: The patient requires daily medical management by a physician with specialized training in physical medicine and rehabilitation for the following reasons: Direction of a multidisciplinary physical rehabilitation program to maximize functional independence : Yes Medical management of patient stability for increased activity during participation in an intensive rehabilitation regime.: Yes Analysis of laboratory values and/or radiology reports with any subsequent need for medication adjustment and/or medical intervention. : Yes   I attest that I was present, lead the team conference, and concur with the assessment and plan of the team.   Retta Diones 12/08/2019, 1:34 PM   Team conference was held via web/ teleconference due to [place]  - 19

## 2019-12-08 NOTE — Progress Notes (Signed)
Occupational Therapy Session Note  Patient Details  Name: Yolanda Flowers MRN: [id number] Date of Birth: 1940-11-05  Today's Date: 12/08/2019 OT Individual Time: [id number] OT Individual Time Calculation (min): 40 min    Short Term Goals: Week 1:  OT Short Term Goal 1 (Week 1): Pt will perform toileting at min A overall. OT Short Term Goal 2 (Week 1): Pt will maintain dynamic standing balance with min A during self care tasks. OT Short Term Goal 3 (Week 1): Pt will perform LB dressing with min A overall.  Skilled Therapeutic Interventions/Progress Updates:    Upon entering the room, pt supine in bed and requesting to go to bathroom. Supervision for bed mobility and pt ambulating with close supervision and use of RW into bathroom for toileting needs. Pt having BM and perform hygiene needed while seated on commode in order to maintain precautions. Pt changing brief and pants with sit <>stand from commode with CGA. Pt ambulating to sink and sitting to perform grooming tasks with supervision and set up A to wash UB and don clean shirt. Pt remained in wheelchair and breakfast tray set up with call bell within reach.   Therapy Documentation Precautions:  Precautions Precautions: Sternal Precaution Comments: Demonstrated appropriate sternal precautions Restrictions Weight Bearing Restrictions: Yes RUE Weight Bearing: Partial weight bearing RUE Partial Weight Bearing Percentage or Pounds: (5lbs or less with both upper ext) LUE Weight Bearing: Partial weight bearing LUE Partial Weight Bearing Percentage or Pounds: (less than 5LB) General:   Vital Signs: Therapy Vitals Pulse Rate: 88 BP: 132/78 Patient Position (if appropriate): Sitting   Therapy/Group: Individual Therapy  Gypsy Decant 12/08/2019, 8:51 AM

## 2019-12-08 NOTE — Progress Notes (Signed)
Physical Therapy Session Note  Patient Details  Name: Yolanda Flowers MRN: [id number] Date of Birth: January 21, 1941  Today's Date: 12/08/2019 PT Individual Time: 1300-1359 PT Individual Time Calculation (min): 59 min   Short Term Goals: Week 1:  PT Short Term Goal 1 (Week 1): Pt will perform bed<>WC transfers with min assist consistently PT Short Term Goal 2 (Week 1): Pt will ambulate 143f with min assist and LRAD PT Short Term Goal 3 (Week 1): Pt will perform bed mobility with supervision assist  Skilled Therapeutic Interventions/Progress Updates:   Pt received supine in bed and agreeable to PT. Supine>sit transfer with supervision assist  Cues for uwse of bed features. Pt able to don shoes sitting EOB with set up assist from PT. Pt transported to entrance of [place] [facility]   Gait training in simulated community environment over cement sidewalk [id number] 2 and 15096fith supervision assist. Cues for AD management and safety in turns. Additional gait training through  [place] supervision assist [id number] noted to have mild foot drag with fatigue.   Pt transported to day room on rehab unit. PT instructed pt in dynamic balance training with fine motor and cognitive task of peg board puzzle moderate and high difficulty while standing on airex pad. Supervision assist to maintain balance and min-moderate questioning cues for error detection and correction on hight difficulty puzzle.   Additional gait training in rehab unit x [id number] supervision assist as listed above. Pt returned to room and performed ambulatory transfer to bed with supervision assist and RW. Sit>supine completed without assist, and left supine in bed with call bell in reach and all needs met.        Therapy Documentation Precautions:  Precautions Precautions: Sternal Precaution Comments: Demonstrated appropriate sternal precautions Restrictions Weight Bearing Restrictions: Yes RUE Weight Bearing:  Partial weight bearing RUE Partial Weight Bearing Percentage or Pounds: (5lbs or less with both upper ext) LUE Weight Bearing: Partial weight bearing LUE Partial Weight Bearing Percentage or Pounds: (less than 5LB) Pain: Pain Assessment Pain Scale: 0-10 Pain Score: 4  Pain Location: Back Pain Orientation: Mid;Lower Pain Descriptors / Indicators: Sharp Pain Frequency: Constant Patients Stated Pain Goal: 2 Pain Intervention(s): Medication (See eMAR)    Therapy/Group: Individual Therapy  AustiLorie Phenix/2021, 1:59 PM

## 2019-12-08 NOTE — Progress Notes (Signed)
Physical Therapy Session Note  Patient Details  Name: Yolanda Flowers MRN: [id number] Date of Birth: 09/25/1940  Today's Date: 12/08/2019 PT Individual Time: 0900-0955 PT Individual Time Calculation (min): 55 min   Short Term Goals: Week 1:  PT Short Term Goal 1 (Week 1): Pt will perform bed<>WC transfers with min assist consistently PT Short Term Goal 2 (Week 1): Pt will ambulate 100ft with min assist and LRAD PT Short Term Goal 3 (Week 1): Pt will perform bed mobility with supervision assist  Skilled Therapeutic Interventions/Progress Updates: Pt presented in bed agreeable to therapy. Pt states some discomfort but does not need medical intervention at this time. Performed bed mobility supervision with use of bed features. Pt ambulated to rehab gym with RW and overall supervision. Performed toe taps to 4in step x 5 SLE then alternating x 10 bilaterally.  Pt then ambulated to hallway no AD and CGA. Participated in side stepping, backwards walking 25ft x1 ea direction. Performed standing hip abd/add, hamstring curls, heel rises/toe lifts 2 x 10 bilaterally. Pt indicated increased pain in low back, requiring frequent rest breaks, nsg advised as pt requested pain meds after session. Pt ambulated back to rehab gym with x 1 lateral LOB which pt was able to catch self grabbing to wall rail. After seated rest pt performed step with RW to lower curb with supervision and demonstrating good safety with RW. Pt transported back to room due to fatigue and returned to bed via stand pivot with supervision overall. PTA placed KPack to lower back for pain management. Pt left in bed with alarm on, call bell within reach and needs met.       Therapy Documentation Precautions:  Precautions Precautions: Sternal Precaution Comments: Demonstrated appropriate sternal precautions Restrictions Weight Bearing Restrictions: Yes RUE Weight Bearing: Partial weight bearing RUE Partial Weight Bearing Percentage or Pounds:  (5lbs or less with both upper ext) LUE Weight Bearing: Partial weight bearing LUE Partial Weight Bearing Percentage or Pounds: (less than 5LB) General:   Vital Signs: Therapy Vitals Pulse Rate: 88 BP: 132/78 Patient Position (if appropriate): Sitting    Therapy/Group: Individual Therapy  Rosita DeChalus  Rosita DeChalus, PTA  12/08/2019, 10:31 AM  

## 2019-12-08 NOTE — Progress Notes (Signed)
Speech Language Pathology Daily Session Note  Patient Details  Name: LINETH CASTERLINE MRN: [id number] Date of Birth: 1941/03/21  Today's Date: 12/08/2019 SLP Individual Time: 1415-1445 SLP Individual Time Calculation (min): 30 min  Short Term Goals: Week 1: SLP Short Term Goal 1 (Week 1): Pt will demonstate recall of new and daily information with Supervision A cues for use of aids or strategies for short term recall. SLP Short Term Goal 2 (Week 1): Pt will demonstate anticipatory awareness by identifying 2 tasks/activities she will require assistance with at home with Supervision A verbal cues. SLP Short Term Goal 3 (Week 1): Pt will demonstrate ability to problem solving mildly complex to complex functional situations with Supervision A verbal/visual cues.  Skilled Therapeutic Interventions: Pt was seen for skilled ST intervention targeting aforementioned goals. Pt was pleasant and cooperative with unfamiliar therapist. SLP facilitated session by continuing checkbook activity from previous session. Pt was able to complete the checkbook register accurately, and determine the correct balance without use of a calculator. SLP provided min A with deductive reasoning puzzle #3. 83% accuracy without assist.   Pt was left in bed with alarm on, all needs within reach. Continue ST per current plan of care.  Therapy/Group: Individual Therapy   Sanav Remer B. Quentin Ore, [facility], CCC-SLP Speech Language Pathologist  Shonna Chock 12/08/2019, 3:50 PM

## 2019-12-08 NOTE — Progress Notes (Signed)
Speech Language Pathology Discharge Summary  Patient Details  Name: Yolanda Flowers MRN: [id number] Date of Birth: 1940-12-23  Today's Date: 12/09/2019 SLP Individual Time: 1431-1456 SLP Individual Time Calculation (min): 25 min   Skilled Therapeutic Interventions:  Pt was seen for skilled ST targeting education with pt and her caregivers (son and daughter in law). SLP facilitated session with functional conversation regarding goals and progress in ST while inpatient with focus on complex problem solving, anticipatory awareness, and compensatory memory strategies. Discussed specific impact of short term memory impairments on daily functioning as well as recommendation for 24/7 supervision and assistance with complex ADLs such as medication management due to recall deficits. Pt and family in agreement. Discussed compensatory memory strategies and situations in which she may need to use them, and handout provided. Family did not have any specific concerns or questions related to pt's cognitive function. Pt left sitting in wheelchair with alarm set and needs within reach. Continue per current plan of care.     Patient has met 4 of 4 long term goals.  Patient to discharge at overall Modified Independent;Supervision level.  Reasons goals not met: n/a   Clinical Impression/Discharge Summary:   Pt made excellent functional gains and met 4 out of 4 long term goals this admission. Pt currently requires Supervision assist for use of compensatory strategies for short term memory and is Mod I for complex problem solving and anticipatory awareness. Pt will required 24/7 supervision for safety at discharge but does demonstrate excellent safety awareness and improved insight into the impact of her physical and cognitive impairments on daily functioning.  Pt and family education is complete at this time and no follow up ST is indicated.   Care Partner:  Caregiver Able to Provide Assistance: Yes  Type of  Caregiver Assistance: Cognitive  Recommendation:  24 hour supervision/assistance      Equipment: none   Reasons for discharge: Discharged from hospital   Patient/Family Agrees with Progress Made and Goals Achieved: Yes    Arbutus Leas 12/09/2019, 7:28 AM

## 2019-12-09 ENCOUNTER — Inpatient Hospital Stay (HOSPITAL_COMMUNITY): Payer: PPO | Admitting: *Deleted

## 2019-12-09 ENCOUNTER — Inpatient Hospital Stay (HOSPITAL_COMMUNITY): Payer: PPO | Admitting: Physical Therapy

## 2019-12-09 ENCOUNTER — Ambulatory Visit (HOSPITAL_COMMUNITY): Payer: PPO | Admitting: Physical Therapy

## 2019-12-09 ENCOUNTER — Encounter (HOSPITAL_COMMUNITY): Payer: PPO | Admitting: Occupational Therapy

## 2019-12-09 ENCOUNTER — Encounter (HOSPITAL_COMMUNITY): Payer: PPO | Admitting: Speech Pathology

## 2019-12-09 MED ORDER — ASPIRIN 81 MG PO TBEC: 81.0000 mg | DELAYED_RELEASE_TABLET | Freq: Every day | ORAL | 0 refills | Status: DC

## 2019-12-09 MED ORDER — ACETAMINOPHEN 325 MG PO TABS: 325.0000 mg | ORAL_TABLET | ORAL | Status: DC | PRN

## 2019-12-09 MED ORDER — PANTOPRAZOLE SODIUM 40 MG PO TBEC: 40.0000 mg | DELAYED_RELEASE_TABLET | Freq: Every day | ORAL | 0 refills | Status: DC

## 2019-12-09 MED ORDER — VITAMIN D (ERGOCALCIFEROL) 1.25 MG (50000 UNIT) PO CAPS: 50000.0000 [IU] | ORAL_CAPSULE | ORAL | 0 refills | Status: DC

## 2019-12-09 MED ORDER — METOPROLOL TARTRATE 25 MG PO TABS: 25.0000 mg | ORAL_TABLET | Freq: Two times a day (BID) | ORAL | 0 refills | Status: DC

## 2019-12-09 MED ORDER — FE FUMARATE-B12-VIT C-FA-IFC PO CAPS: 1.0000 | ORAL_CAPSULE | Freq: Two times a day (BID) | ORAL | 0 refills | Status: DC

## 2019-12-09 MED ORDER — LIDOCAINE 5 % EX PTCH: 1.0000 | MEDICATED_PATCH | CUTANEOUS | 0 refills | Status: DC

## 2019-12-09 MED ORDER — ATORVASTATIN CALCIUM 80 MG PO TABS: 80.0000 mg | ORAL_TABLET | Freq: Every day | ORAL | 0 refills | Status: DC

## 2019-12-09 MED ORDER — HYDROCERIN EX CREA: 1.0000 "application " | TOPICAL_CREAM | Freq: Two times a day (BID) | CUTANEOUS | 0 refills | Status: DC

## 2019-12-09 MED ORDER — TRAMADOL HCL 50 MG PO TABS: 50.0000 mg | ORAL_TABLET | Freq: Four times a day (QID) | ORAL | Status: DC | PRN

## 2019-12-09 MED ORDER — CLOPIDOGREL BISULFATE 75 MG PO TABS: 75.0000 mg | ORAL_TABLET | Freq: Every day | ORAL | 0 refills | Status: DC

## 2019-12-09 NOTE — Progress Notes (Signed)
[facility] PHYSICAL MEDICINE & REHABILITATION PROGRESS NOTE   Subjective/Complaints: Ambulating >150 feet; may DC Lovenox given bleeding risk.  Back pain is much improved with Tramadol, no longer with tremors. No further bleeding from loop recorder and IV sites No other complaints  ROS: Pt denies SOB, abd pain, CP, N/V/C/D, and vision changes  Objective:   No results found. No results for input(s): WBC, HGB, HCT, PLT in the last 72 hours. No results for input(s): NA, K, CL, CO2, GLUCOSE, BUN, CREATININE, CALCIUM in the last 72 hours.  Intake/Output Summary (Last 24 hours) at 12/09/2019 1213 Last data filed at 12/09/2019 0734 Gross per 24 hour  Intake 618 ml  Output --  Net 618 ml     Physical Exam: Vital Signs Blood pressure (!) 150/79, pulse 78, temperature 98.2 F (36.8 C), resp. rate 16, height 5\' 6"  (1.676 m), weight 72.7 kg, SpO2 97 %. Nursing note and vitals reviewed. Ambulating with RW S with therapy Head: Mild R facial droop- unchanged  ectropion left due to biopsy, stable L lower lid lag- unchanged L visual fields are absent and also lower 1/2 of L visual field - still affected  Neck: No tracheal deviation present.  Cardiovascular: RRR Chest incision healed from CABG 3/18  Respiratory: CTA b/l- no coughing with speech today (did iniitally) GI: soft, NT, ND; (+)BS  Soft, NT, ND, (+)BS  Musculoskeletal:        General: No edema.     Cervical back: Normal range of motion and neck supple.     Comments: UEs 5-/5 in deltoids, biceps, triceps, WE, grip and finger abd LEs- appears equal as well- HF 4+/5, KE 4+/5, DF and PF 5-/5 B/L  Neurological: She is alert and oriented to person, place, and time.  Intact to light touch in all 4 extremities Slowed finger to nose B/L- R slightly worse  Skin: Skin is warm and dry.  R anterior calf dark bruising- also sign of healing from vein harvesting  Right antecubital fossa and loop recorder incision sites healing  nicely Psychiatric: appropriate  Assessment/Plan: 1. Functional deficits secondary to embolic stroke which require 3+ hours per day of interdisciplinary therapy in a comprehensive inpatient rehab setting.  Physiatrist is providing close team supervision and 24 hour management of active medical problems listed below.  Physiatrist and rehab team continue to assess barriers to discharge/monitor patient progress toward functional and medical goals  Care Tool:  Bathing    Body parts bathed by patient: Right arm, Left arm, Chest, Abdomen, Front perineal area, Right upper leg, Left upper leg, Face, Buttocks, Right lower leg, Left lower leg   Body parts bathed by helper: Buttocks     Bathing assist Assist Level: Supervision/Verbal cueing     Upper Body Dressing/Undressing Upper body dressing   What is the patient wearing?: Pull over shirt    Upper body assist Assist Level: Set up assist    Lower Body Dressing/Undressing Lower body dressing      What is the patient wearing?: Pants, Underwear/pull up     Lower body assist Assist for lower body dressing: Supervision/Verbal cueing     Toileting Toileting Toileting Activity did not occur (Clothing management and hygiene only): N/A (no void or bm)  Toileting assist Assist for toileting: Supervision/Verbal cueing     Transfers Chair/bed transfer  Transfers assist     Chair/bed transfer assist level: Supervision/Verbal cueing     Locomotion Ambulation   Ambulation assist      Assist  level: Supervision/Verbal cueing Assistive device: Walker-rolling Max distance: 150   Walk 10 feet activity   Assist     Assist level: Supervision/Verbal cueing Assistive device: Walker-rolling   Walk 50 feet activity   Assist Walk 50 feet with 2 turns activity did not occur: Safety/medical concerns  Assist level: Supervision/Verbal cueing Assistive device: Walker-rolling    Walk 150 feet activity   Assist    Assist  level: Supervision/Verbal cueing Assistive device: Walker-rolling    Walk 10 feet on uneven surface  activity   Assist Walk 10 feet on uneven surfaces activity did not occur: Safety/medical concerns         Wheelchair     Assist Will patient use wheelchair at discharge?: No   Wheelchair activity did not occur: Safety/medical concerns(sternal precautions)         Wheelchair 50 feet with 2 turns activity    Assist    Wheelchair 50 feet with 2 turns activity did not occur: Safety/medical concerns       Wheelchair 150 feet activity     Assist  Wheelchair 150 feet activity did not occur: Safety/medical concerns       Blood pressure (!) 150/79, pulse 78, temperature 98.2 F (36.8 C), resp. rate 16, height 5\' 6"  (1.676 m), weight 72.7 kg, SpO2 97 %.  Medical Problem List and Plan: 1.  Impaired Function, ADLs and moblity secondary to B/L embolic strokes- thalamus, corpus callosum, and Basal ganglia             -patient may shower- still has sternal precautions from previous CABG 11/04/19             -ELOS/Goals: 2-2.5 weeks; mod I to Supervision  -Continue CIR PT, OT, SLP 2.  Antithrombotics: -DVT/anticoagulation:  Lovenox d/ced as ambulating >150 feet             -antiplatelet therapy: ASA to stop after 3 weeks and Plavix to continue.  3. Chronic back pain.Pain Management: Worse with activity--used oxycodone--does not want tramadol "it causes stroke"  4/22: PRN tramadol added for moderate-severe low back pain; greatly helped with pain. No longer with hand tremors.  4. Mood: LCSW to follow for evaluation and support             -antipsychotic agents: N/A 5. Neuropsych: This patient is capable of making decisions on her own behalf. 6. Skin/Wound Care: Routine pressure relief measures. Cellulitis has resolved.  7. Fluids/Electrolytes/Nutrition: Monitor I/O. Check lytes in am. 8. CAD s/p CABG 10/29/19: Continue sternal precautions.  4/18- Sternal precautions  until 01/04/20 9. Allergies: Uses flonase at nights.  10. HTN: Monitor BP tid--continue to monitor. On metropolol 12.5 daily.   4/22: Labile today 11.  Thrombocytosis: Leucocytosis due to hydrea--Vitamin D 50,000 units every 2 weeks. She has been evaluated by Hem/Onc--WBC around 18, 0000 due to hydrea. Will continue to monitor for fevers or other signs of infection.   4/17- WBC down to 16k- con't to monitor  4/19: WBC decreased to 14.3 13. Dyslipidemia: On Lipitor 14. Constipation: Had BM 4/14. Added sorbitol PRN  4/18- LBM this AM 15. Hyperkalemia: K+ 5.7 today. Kayexalate 10mL x1. Repeat BMP tomorrow  4/15: K+ has improved to 3.9.  16. Bleeding from loop recorder and IV sites:  4/19: Still having some bleeding from IV site during dressing change. Placed nursing care order for Thrombo-pad over the site. Hgb 10.6 this mornig. Trending down slightly  4/21: no further bleeding; continue to monitor.  17. AKI: Cr trending  downward. 4/19: normalized.  18. GERD/reflux  4/18- changed protonix to 5:30-6 am in AM so 1 hr away from food and other meds- per ptr request- wants to be woken up to receive it.    LOS: 9 days A FACE TO FACE EVALUATION WAS PERFORMED  Yolanda Flowers 12/09/2019, 12:13 PM

## 2019-12-09 NOTE — Progress Notes (Signed)
Team Conference Report to Patient/Family  Team Conference discussion was reviewed with the patient and daughter, including goals, any changes in plan of care and target discharge date.  Patient and daughter express understanding and are in agreement.  The patient has a target discharge date of 12/10/19. Patient to be discharged at a supervision level with [facility] follow up.Family education scheduled for 12/09/19 @ 1 p.m.  Dorien Chihuahua B 12/09/2019, 9:01 AM

## 2019-12-09 NOTE — Progress Notes (Signed)
Physical Therapy Discharge Summary  Patient Details  Name: Yolanda Flowers MRN: [id number] Date of Birth: [birth year]  Today's Date: 12/09/2019 PT Individual Time: 4098-1191 PT Individual Time Calculation (min): 43 min    Patient has met 8 of 8 long term goals due to improved activity tolerance, improved balance, improved postural control, increased strength, increased range of motion, ability to compensate for deficits, functional use of  right lower extremity and improved coordination.  Patient to discharge at an ambulatory level Supervision.   Patient's care partner is independent to provide the necessary physical assistance at discharge.  Reasons goals not met: All PT goals met   Recommendation:  Patient will benefit from ongoing skilled PT services in home health setting to continue to advance safe functional mobility, address ongoing impairments in balance, strength, gait, safety, tranfers, and minimize fall risk.  Equipment: No equipment provided  Reasons for discharge: treatment goals met and discharge from hospital  Patient/family agrees with progress made and goals achieved: Yes   PT treatment:   Pt received sitting in WC and agreeable to PT. PT instructed pt in Grad day assessment to measure progress toward goals. See below for details. Pt's son and daughter in law present for family education. PT instructed pt in gait training in room with RW x 15 ft with distant supervision assist. Stair management training x 4 steps with BUE support and cues for step to gait pattern. Pt able to demonstrate improved carryover compared to AM session. Curb management training also performed with RW x 2 with supervision assist to assess access to house. Car transfer training to son's truck x 2 attempts with min assist from PT and CGA from son. Cues for safety and propr UE placement to reduce fall risk. Patient returned to room and left sitting in [facility] with call bell in reach and all needs met.        PT Discharge Precautions/Restrictions   fall  Pain Pain Assessment Pain Scale: 0-10 Pain Score: 0-No pain Vision/Perception  Vision - Assessment Eye Alignment: Within Functional Limits Alignment/Gaze Preference: Within Defined Limits Convergence: Within functional limits Perception Perception: Within Functional Limits Praxis Praxis: Intact  Cognition Overall Cognitive Status: Impaired/Different from baseline Arousal/Alertness: Awake/alert Orientation Level: Oriented X4 Sustained Attention: Appears intact Memory: Impaired Memory Impairment: Decreased short term memory Decreased Short Term Memory: Verbal basic;Functional complex Awareness: Appears intact Problem Solving: Appears intact Sequencing: Appears intact Decision Making: Appears intact Initiating: Appears intact Safety/Judgment: Appears intact Sensation Sensation Light Touch: Appears Intact Proprioception: Appears Intact Coordination Gross Motor Movements are Fluid and Coordinated: No Fine Motor Movements are Fluid and Coordinated: Yes Coordination and Movement Description: mild ataxia in the RLE Motor  Motor Motor: Hemiplegia Motor - Discharge Observations: mild R sided hemiplegia. and dysmetria  Mobility Bed Mobility Bed Mobility: Rolling Right;Rolling Left;Sit to Supine;Supine to Sit Rolling Right: Independent Rolling Left: Independent Supine to Sit: Independent Sit to Supine: Independent Transfers Transfers: Sit to [employer] Transfers Sit to Stand: Supervision/Verbal cueing Stand Pivot Transfers: Supervision/Verbal cueing Transfer (Assistive device): Rolling walker Locomotion  Gait Ambulation: Yes Gait Assistance: Supervision/Verbal cueing Assistive device: Rolling walker Gait Assistance Details: Verbal cues for safe use of DME/AE;Verbal cues for precautions/safety Gait Gait: Yes Gait Pattern: Impaired Gait Pattern: Lateral hip instability Stairs / Additional  Locomotion Stairs: Yes Stairs Assistance: Supervision/Verbal cueing Stair Management Technique: Two rails Height of Stairs: 6 Wheelchair Mobility Wheelchair Mobility: No  Trunk/Postural Assessment  Cervical Assessment Cervical Assessment: Exceptions to [facility] Thoracic Assessment Thoracic Assessment: Exceptions  to [facility] Lumbar Assessment Lumbar Assessment: Exceptions to [facility] Postural Control Postural Control: Within Functional Limits  Balance Balance Balance Assessed: Yes Standardized Balance Assessment Standardized Balance Assessment: Berg Balance Test Berg Balance Test Sit to Stand: Able to stand without using hands and stabilize independently Standing Unsupported: Able to stand safely 2 minutes Sitting with Back Unsupported but Feet Supported on Floor or Stool: Able to sit safely and securely 2 minutes Stand to Sit: Sits safely with minimal use of hands Transfers: Able to transfer safely, minor use of hands Standing Unsupported with Eyes Closed: Able to stand 10 seconds with supervision Standing Ubsupported with Feet Together: Able to place feet together independently and stand for 1 minute with supervision From Standing, Reach Forward with Outstretched Arm: Can reach forward >12 cm safely (5") From Standing Position, Pick up Object from Floor: Able to pick up shoe, needs supervision From Standing Position, Turn to Look Behind Over each Shoulder: Looks behind from both sides and weight shifts well Turn 360 Degrees: Needs close supervision or verbal cueing Standing Unsupported, Alternately Place Feet on Step/Stool: Able to complete >2 steps/needs minimal assist Standing Unsupported, One Foot in Front: Able to plae foot ahead of the other independently and hold 30 seconds Standing on One Leg: Tries to lift leg/unable to hold 3 seconds but remains standing independently Total Score: 42 Static Sitting Balance Static Sitting - Level of Assistance: 7: Independent Dynamic Sitting  Balance Dynamic Sitting - Level of Assistance: 6: Modified independent (Device/Increase time) Static Standing Balance Static Standing - Balance Support: No upper extremity supported Static Standing - Level of Assistance: 5: Stand by assistance Dynamic Standing Balance Dynamic Standing - Balance Support: During functional activity;Bilateral upper extremity supported Dynamic Standing - Level of Assistance: 5: Stand by assistance Extremity Assessment      RLE Assessment RLE Assessment: Within Functional Limits General Strength Comments: 4+/5 LLE Assessment LLE Assessment: Within Functional Limits General Strength Comments: grossly 4+/5    Yolanda Flowers 12/09/2019, 2:37 PM

## 2019-12-09 NOTE — Evaluation (Signed)
Recreational Therapy Assessment and Plan  Patient Details  Name: Yolanda Flowers MRN: [id number] Date of Birth: 01/02/1941 Today's Date: 12/09/2019  Rehab Potential:   Good ELOS:  d/c 23   Assessment  Problem List:      Patient Active Problem List   Diagnosis Date Noted  . Embolic stroke ([place]) 78/29/5621  . Cellulitis of right leg   . Abdominal pain   . Stroke due to embolism ([place]) 11/25/2019  . S/P CABG x 4 11/04/2019  . CAD (coronary artery disease) 10/25/2019  . Accelerating angina ([place]) 08/26/2019  . Vitamin D deficiency 08/24/2019  . Combined hyperlipidemia 08/24/2019  . Body mass index 29.0-29.9, adult 08/24/2019  . Vitamin B 12 deficiency 08/24/2019  . Malignant melanoma of great toe ([place]) 08/24/2019  . Pedal edema 08/24/2019  . Skin cancer 08/24/2019  . Squamous cell carcinoma, scalp/neck 08/24/2019  . Ischial bursitis, right 08/24/2019  . Swelling of limb 08/24/2019  . Renal insufficiency 08/24/2019  . Atherosclerosis of arteries 08/24/2019  . Dyspnea 02/09/2019  . Solitary pulmonary nodule on lung CT 02/09/2019  . Gastrointestinal bleeding 06/23/2018  . VIN III (vulvar intraepithelial neoplasia III) 10/09/2015  . Melanoma in situ ([place]) 06/29/2014  . Essential thrombocytosis ([place]) 06/22/2014  . COPD (chronic obstructive pulmonary disease) ([place]) 06/21/2014  . HTN (hypertension) 06/21/2014  . Allergic rhinitis 06/21/2014  . GERD (gastroesophageal reflux disease) 06/21/2014    Past Medical History:      Past Medical History:  Diagnosis Date  . Anxiety   . Arthritis   . Asthma   . Bilateral edema of lower extremity   . Complication of anesthesia    difficulty waking  . Congestive heart failure (CHF) ([place])   . COPD (chronic obstructive pulmonary disease) ([place])   . Coronary artery disease   . Dyspnea on exertion   . GERD (gastroesophageal reflux disease)   . History of melanoma excision    left greast toe 2015  . History of squamous  cell carcinoma excision    face--  multiple excisions  . History of subdural hematoma    2008  . Hypertension   . OSA (obstructive sleep apnea)    intolerant  . Thrombocytosis ([place])    takes hydoxyurea--  MONITORED BY DR Hinton Rao [facility])  . VIN III (vulvar intraepithelial neoplasia III)   . Wears dentures    UPPER AND LOWER PARTIAL  . Wears glasses    Past Surgical History:       Past Surgical History:  Procedure Laterality Date  . ABDOMINAL HYSTERECTOMY  age 89  . CARDIAC CATHETERIZATION  03/172021  . CORONARY ARTERY BYPASS GRAFT N/A 11/04/2019   Procedure: CORONARY ARTERY BYPASS GRAFTING (CABG) x 4, with ENDOSCOPIC HARVESTING OF RIGHT GREATER SAPHENOUS VEIN.;  Surgeon: Gaye Pollack, MD;  Location: [place] OR;  Service: Open Heart Surgery;  Laterality: N/A;  . INTRAVASCULAR ULTRASOUND/IVUS N/A 11/03/2019   Procedure: Intravascular Ultrasound/IVUS;  Surgeon: Nelva Bush, MD;  Location: [place] CV LAB;  Service: Cardiovascular;  Laterality: N/A;  . KNEE ARTHROSCOPY Left 2004  . LEFT HEART CATH AND CORONARY ANGIOGRAPHY N/A 11/03/2019   Procedure: LEFT HEART CATH AND CORONARY ANGIOGRAPHY;  Surgeon: Nelva Bush, MD;  Location: [place] CV LAB;  Service: Cardiovascular;  Laterality: N/A;  . LOOP RECORDER INSERTION N/A 11/30/2019   Procedure: LOOP RECORDER INSERTION;  Surgeon: Thompson Grayer, MD;  Location: [place] CV LAB;  Service: Cardiovascular;  Laterality: N/A;  . MELANOMA EXCISION  2015  left great toe  . REPAIR PERONEAL TENDONS ANKLE  2004  . SUBDURAL HEMATOMA EVACUATION VIA CRANIOTOMY  2008      week later  post-op  [facility] Surgery  . TEE WITHOUT CARDIOVERSION N/A 11/04/2019   Procedure: TRANSESOPHAGEAL ECHOCARDIOGRAM (TEE);  Surgeon: Gaye Pollack, MD;  Location: [place];  Service: Open Heart Surgery;  Laterality: N/A;  . VULVECTOMY N/A 10/24/2015   Procedure: WIDE LOCAL EXCISION OF THE VULVA ;  Surgeon: Everitt Amber, MD;   Location: [place];  Service: Gynecology;  Laterality: N/A;    Assessment & Plan Clinical Impression: Patient is a81 year old female with history of HTN,chronicthrombocytosis, CAD s/p CABG 10/29/2019 who was admitted on 11/25/2019 with LUE weakness and visual changes. Patient reported visual changes after surgery with progressive loss of vision in left eye and decreased vision in right eye 2 days after dischargefrom CABG. CTA head/neck was negative for acute abnormality and showed moderate to marked chronic microvascular changes. MRI brain done revealing scattered bilateral acute cerebral and cerebellar infarcts and small subacute acute on chronic left frontal SDH. Stroke was felt to be embolic and neurology recommended DAPT x3-week as well as TEE/loop recorder for work-up. 2D echo showed EF of 65 to 70% with no wall abnormality and mild aortic sclerosis. BLE Dopplers were negative for DVT. Cardiology recommended embolism due to clamping of aorta or cardioembolic in nature.She underwent loop recorder placement on 04/13 by Dr. Rayann Heman. Patient with resultant decreased endurance, right-sided weakness and visual deficits affecting ADLs and mobility  Patient transferred to CIR on 11/30/2019 .   Met with pt today per team referral to discuss leisure interests and activity analysis with potential modifications.  Pt is anxious to return home and get back to doing things she enjoys.    Plan   No further TR as pt is expected to discharge home tomorrow. Recommendations for other services: None   Discharge Criteria: Patient will be discharged from TR if patient refuses treatment 3 consecutive times without medical reason.  If treatment goals not met, if there is a change in medical status, if patient makes no progress towards goals or if patient is discharged from hospital.  The above assessment, treatment plan, treatment alternatives and goals were discussed and mutually agreed  upon: by patient  [place] 12/09/2019, 12:55 PM

## 2019-12-09 NOTE — Progress Notes (Signed)
Physical Therapy Session Note  Patient Details  Name: Yolanda Flowers MRN: [id number] Date of Birth: [birth year]  Today's Date: 12/09/2019 PT Individual Time: 0850-1002 PT Individual Time Calculation (min): 72 min   Short Term Goals: Week 1:  PT Short Term Goal 1 (Week 1): Pt will perform bed<>WC transfers with min assist consistently PT Short Term Goal 2 (Week 1): Pt will ambulate 12f with min assist and LRAD PT Short Term Goal 3 (Week 1): Pt will perform bed mobility with supervision assist  Skilled Therapeutic Interventions/Progress Updates:   Pt received supine in bed and agreeable to PT. Supine>sit transfer without assist or cues. Gait training through hall x 1521fwith supervision assist and RW, no foot drag or LOB noted.   Pt performed 5xSTS: 16 sec (>15 sec indicates increased fall risk) PT instructed pt in TUG: 18.5 sec (average of 3 trials; >13.5 sec indicates increased fall risk)  Patient demonstrates increased fall risk as noted by score of   43/56 on Berg Balance Scale.  (<36= high risk for falls, close to 100%; 37-45 significant >80%; 46-51 moderate >50%; 52-55 lower >25%)  PT instructed pt in Modified [place] level A HEP with hand out provided: LAQ, HS curl, hip abduction, sit<>stand, mini-squats, each completed x 10 BLE with cues for posture and technique to maximize strengthening aspect of movement.  tandem standing with BUE support 2 x 10 sec each. Supervision assist throughout from PT as well as BUE support on chair. Cues for safety and proper UE support when completing at home.   Dynamic gait training overall unlevel blue mat x 105fith supervision assist and min cues for AD management.    Stair management training with BUE support and cues for step-to gait pattern. Pt continued to perform with step through with no LOB noted.   Pt returned to room and performed ambulatory transfer to bed with RW from rehab gym x [id number]>supine completed without assist or rails, and  left supine in bed with call bell in reach and all needs met.       Therapy Documentation Precautions:  Precautions Precautions: Sternal Precaution Comments: Demonstrated appropriate sternal precautions Restrictions Weight Bearing Restrictions: Yes RUE Weight Bearing: Partial weight bearing RUE Partial Weight Bearing Percentage or Pounds: (5lbs or less with both upper ext) LUE Weight Bearing: Partial weight bearing LUE Partial Weight Bearing Percentage or Pounds: (less than 5LB) Pain: Pain Assessment Pain Scale: 0-10 Pain Score: 7  Pain Type: Chronic pain Pain Location: Back Pain Orientation: Lower Pain Descriptors / Indicators: Constant;Sharp Pain Frequency: Constant Pain Onset: On-going Patients Stated Pain Goal: 2 Pain Intervention(s): Medication (See eMAR) Locomotion : Gait Ambulation: Yes Gait Assistance: Supervision/Verbal cueing Gait Distance (Feet): 150 Feet Assistive device: Rolling walker Gait Assistance Details: Verbal cues for safe use of DME/AE;Verbal cues for precautions/safety Gait Gait: Yes Gait Pattern: Impaired Gait Pattern: Lateral hip instability Stairs / Additional Locomotion Stairs: Yes Stairs Assistance: Supervision/Verbal cueing Stair Management Technique: Two rails Number of Stairs: 12 Height of Stairs: 6 Wheelchair Mobility Wheelchair Mobility: No      Balance: Balance Balance Assessed: Yes Standardized Balance Assessment Standardized Balance Assessment: Berg Balance Test Berg Balance Test Sit to Stand: Able to stand without using hands and stabilize independently Standing Unsupported: Able to stand safely 2 minutes Sitting with Back Unsupported but Feet Supported on Floor or Stool: Able to sit safely and securely 2 minutes Stand to Sit: Sits safely with minimal use of hands Transfers: Able to transfer safely, minor use  of hands Standing Unsupported with Eyes Closed: Able to stand 10 seconds with supervision Standing Ubsupported  with Feet Together: Able to place feet together independently and stand for 1 minute with supervision From Standing, Reach Forward with Outstretched Arm: Can reach forward >12 cm safely (5") From Standing Position, Pick up Object from Floor: Able to pick up shoe, needs supervision From Standing Position, Turn to Look Behind Over each Shoulder: Looks behind from both sides and weight shifts well Turn 360 Degrees: Needs close supervision or verbal cueing Standing Unsupported, Alternately Place Feet on Step/Stool: Able to complete >2 steps/needs minimal assist Standing Unsupported, One Foot in Front: Able to plae foot ahead of the other independently and hold 30 seconds Standing on One Leg: Tries to lift leg/unable to hold 3 seconds but remains standing independently Total Score: 42 Static Sitting Balance Static Sitting - Level of Assistance: 7: Independent Dynamic Sitting Balance Dynamic Sitting - Level of Assistance: 6: Modified independent (Device/Increase time) Static Standing Balance Static Standing - Balance Support: No upper extremity supported Static Standing - Level of Assistance: 5: Stand by assistance Dynamic Standing Balance Dynamic Standing - Balance Support: During functional activity;Bilateral upper extremity supported Dynamic Standing - Level of Assistance: 5: Stand by assistance    Therapy/Group: Individual Therapy  Lorie Phenix 12/09/2019, 10:05 AM

## 2019-12-09 NOTE — Discharge Summary (Signed)
Physician Discharge Summary  Patient ID: CORI ALEXIE MRN: [id number] DOB/AGE: 02/02/1941 79 y.o.  Admit date: 11/30/2019 Discharge date: 12/09/2019  Discharge Diagnoses:  Principal Problem:   Embolic stroke [facility]) Active Problems:   HTN (hypertension)   Essential thrombocytosis (HCC)   GERD (gastroesophageal reflux disease)   S/P CABG x 4   Acute blood loss anemia   Leucocytosis   Discharged Condition: stable   Significant Diagnostic Studies:  Labs:  Basic Metabolic Panel: BMP Latest Ref Rng & Units 12/06/2019 12/02/2019 12/01/2019  Glucose 70 - 99 mg/dL 91 96 97  BUN 8 - 23 mg/dL 12 17 19   Creatinine 0.44 - 1.00 mg/dL 1.01(H) 1.03(H) 1.12(H)  BUN/Creat Ratio 12 - 28 - - -  Sodium 135 - 145 mmol/L 140 141 138  Potassium 3.5 - 5.1 mmol/L 3.9 3.9 5.7(H)  Chloride 98 - 111 mmol/L 106 108 108  CO2 22 - 32 mmol/L 23 22 19(L)  Calcium 8.9 - 10.3 mg/dL 9.0 8.9 8.9    CBC: CBC Latest Ref Rng & Units 12/06/2019 12/03/2019 12/02/2019  WBC 4.0 - 10.5 K/uL 14.3(H) - -  Hemoglobin 12.0 - 15.0 g/dL 10.6(L) 10.9(L) 11.0(L)  Hematocrit 36.0 - 46.0 % 33.5(L) 34.2(L) 34.5(L)  Platelets 150 - 400 K/uL 188 - -    CBG: No results for input(s): GLUCAP in the last 168 hours.  Brief HPI:   Yolanda Flowers is a 79 y.o. female with history of HTN, chronic thrombocytosis, CAD s/p CABG 10/29/2019 who was admitted on 11/25/2019 with weakness and visual changes.  Patient reported visual changes episode with progressive loss of vision in left eye as well as decreased vision in right eye 2 days post discharge to home.  MRI brain done revealing scattered bilateral acute cerebral and cerebellar infarcts and small subacute acute on chronic left frontal SDH.  Stroke felt to be embolic neurology recommended DAPT x3 weeks followed by Plavix alone.  BLE Dopplers negative for DVT.  Loop recorder placed on 04/13 by Dr. Rayann Heman.  Patient with resultant right-sided weakness with visual deficits as well as decreased  endurance.  CIR was recommended due to functional decline.    Hospital Course: ANIJHA SMOLINSKY was admitted to rehab 11/30/2019 for inpatient therapies to consist of PT, ST and OT at least three hours five days a week. Past admission physiatrist, therapy team and rehab RN have worked together to provide customized collaborative inpatient rehab. Blood pressures were monitored on TID basis and have been controlled on low-dose metoprolol.  She was maintained on sternal precautions during his stay.  Sternal incision and right leg 70 incisions are healing well without any signs or symptoms of infections.  GERD has improved with scheduling of protonix in early am.   She did have bleeding from her loop recorder site day post procedure as well as bleeding from prior IV site. Thrombopad used with resolution of bleeding. Serial CBC showed mild drop in H/H which is stable. WBC is trending down and platelets are stable. BMET showed that renal status is improving.  Her po intake has been good and she is continent of bowel and bladder. She has made gains during rehab stay and is currently at supervision level. She will continue to receive follow up HHPT and [place]  after discharge   Rehab course: During patient's stay in rehab weekly team conferences was held to monitor patient's progress, set goals and discuss barriers to discharge. At admission, patient required mod assist with mobility and min to  mod assist with ADL tasks. She required supervisory cues for complex problem solving. She  has had improvement in activity tolerance, balance, postural control as well as ability to compensate for deficits. She requires supervision for transfers and to ambulate 150' with RW. She requires min assist for car/truck transfers with cues for placement. She has had improvement in cognition and is able to use compensatory memory strategies and complete complex tasks at modified independent level.  Family education was completed regarding  all aspects of care and safety.    Disposition: Home  Diet: heart healthy.   Special Instructions: 1. Continue sternal precautions  Till 5/18 2. ASA to continue thorough 12/17/18 3. Recommend repeat CBC in a week for follow up on H/H.   Discharge Instructions    Ambulatory referral to Physical Medicine Rehab   Complete by: As directed    1-2 weeks TC appointment     Allergies as of 12/10/2019      Reactions   Cephalexin Hives   Ciprofloxacin Other (See Comments)   Gi intolerance   Penicillins Rash   Childhood reaction Did it involve swelling of the face/tongue/throat, SOB, or low BP? No Did it involve sudden or severe rash/hives, skin peeling, or any reaction on the inside of your mouth or nose? No Did you need to seek medical attention at a hospital or doctor's office? No When did it last happen?50 years ago If all above answers are "NO", may proceed with cephalosporin use.      Medication List    STOP taking these medications   erythromycin ophthalmic ointment   lansoprazole 30 MG capsule Commonly known as: PREVACID Replaced by: pantoprazole 40 MG tablet   mupirocin ointment 2 % Commonly known as: BACTROBAN   polyethylene glycol 17 g packet Commonly known as: MIRALAX / GLYCOLAX     TAKE these medications   acetaminophen 325 MG tablet Commonly known as: TYLENOL Take 1-2 tablets (325-650 mg total) by mouth every 4 (four) hours as needed for mild pain.   ammonium lactate 12 % lotion Commonly known as: LAC-HYDRIN Apply 1 application topically daily as needed (skin spots).   aspirin 81 MG EC tablet Take 1 tablet (81 mg total) by mouth daily. Last dose on 04/30. What changed: additional instructions   atorvastatin 80 MG tablet Commonly known as: LIPITOR Take 1 tablet (80 mg total) by mouth daily at 6 PM.   CALCIUM 600 + D PO Take 1 tablet by mouth in the morning and at bedtime.   clopidogrel 75 MG tablet Commonly known as: PLAVIX Take 1 tablet  (75 mg total) by mouth daily.   ferrous [id number] C-folic acid capsule Commonly known as: TRINSICON / FOLTRIN Take 1 capsule by mouth 2 (two) times daily after a meal.   fluticasone 50 MCG/ACT nasal spray Commonly known as: FLONASE Place 2 sprays into both nostrils at bedtime.   hydrocerin Crea Apply 1 application topically 2 (two) times daily.   hydroxyurea 500 MG capsule Commonly known as: HYDREA Take 500 mg by mouth daily.   lidocaine 5 % Commonly known as: LIDODERM Place 1 patch onto the skin daily. Remove & Discard patch within 12 hours or as directed by MD   Magnesium 250 MG Tabs Take 250 mg by mouth at bedtime.   metoprolol tartrate 25 MG tablet Commonly known as: LOPRESSOR Take 1 tablet (25 mg total) by mouth 2 (two) times daily.   pantoprazole 40 MG tablet Commonly known as: PROTONIX Take 1 tablet (40  mg total) by mouth daily. Replaces: lansoprazole 30 MG capsule   PreserVision AREDS 2 Caps Take 1 capsule by mouth in the morning and at bedtime.   traMADol 50 MG tablet Commonly known as: ULTRAM Take 1 tablet (50 mg total) by mouth every 6 (six) hours as needed for moderate pain.   vitamin C 1000 MG tablet Take 1,000 mg by mouth daily.   Vitamin D (Ergocalciferol) 1.25 MG (50000 UNIT) Caps capsule Commonly known as: DRISDOL Take 50,000 Units by mouth every 14 (fourteen) days. Notes to patient: One pill every other week--last taken 12/06/19      Follow-up Information    Kirsteins, Luanna Salk, MD Follow up.   Specialty: Physical Medicine and Rehabilitation Why: Office will call you with follow up appointment Contact information: [place] [address] [phone]        [name], MD. Call.   Specialty: Internal Medicine Why: for post hospital appointment Contact information: [address] [place] [address] [phone]        [facility]. Call on 12/13/2019.   Why: for  post stroke follow up Contact information: [address]     [address] [place] [phone]       Gaye Pollack, MD Follow up on 12/22/2019.   Specialty: Cardiothoracic Surgery Why: Appointment at 12 noon.  Contact information: [address] [place] [address] [phone]        Revankar, [name], MD Follow up.   Specialty: Cardiology Contact information: [place] [address] [phone]           Signed: Bary Leriche 12/14/2019, 2:29 PM

## 2019-12-09 NOTE — Progress Notes (Signed)
Occupational Therapy Discharge Summary  Patient Details  Name: Yolanda Flowers MRN: [id number] Date of Birth: [birth year]  Today's Date: 12/09/2019 OT Individual Time: 1300-1345 OT Individual Time Calculation (min): 45 min    Patient has met 11 of 11 long term goals due to improved activity tolerance, improved balance, ability to compensate for deficits and improved coordination.  Patient to discharge at overall Supervision level.  Patient's care partner is independent to provide the necessary supervision assistance at discharge.    Reasons goals not met: all goals met  Recommendation:  Patient will benefit from ongoing skilled OT services in home health setting to continue to advance functional skills in the area of BADL and iADL.  Equipment: TTB  Reasons for discharge: treatment goals met  Patient/family agrees with progress made and goals achieved: Yes   OT Intervention: Upon entering the room, pt supine in bed with son and daughter in law present for hands on family education. Pt with no c/o pain and requesting to use bathroom. OT reviewed pt's current goal level and demonstrated supervision with ambulation and self care tasks with use of RW. Min cuing needed for sternal precautions at first. Caregivers demonstrated ability to perform cuing and supervision for safety. Pt transferred into wheelchair and assisted to ADL apartment. OT demonstrated transfer onto TTB with use of RW in tub shower combination. Caregiver assisting pt with demonstration with supervision overall. OT reviewed recommendations for safety with bathing. Pt ambulating 150' with Rw and supervision back to room. OT educated pt and caregiver on [place] recommendation with them verbalizing understanding and agreement. No further questions at this time.   OT Discharge Precautions/Restrictions  Precautions Precautions: Sternal Pain Pain Assessment Pain Scale: 0-10 Pain Score: 0-No pain Vision Baseline Vision/History:  Wears glasses Wears Glasses: At all times Eye Alignment: Within Functional Limits Alignment/Gaze Preference: Within Defined Limits Convergence: Within functional limits Visual Fields: No apparent deficits Perception  Perception: Within Functional Limits Praxis Praxis: Intact Cognition Overall Cognitive Status: Impaired/Different from baseline Arousal/Alertness: Awake/alert Orientation Level: Oriented X4 Sustained Attention: Appears intact Memory: Impaired Memory Impairment: Decreased short term memory Decreased Short Term Memory: Verbal basic;Functional complex Awareness: Appears intact Problem Solving: Appears intact Sequencing: Appears intact Decision Making: Appears intact Initiating: Appears intact Safety/Judgment: Appears intact Sensation Sensation Light Touch: Appears Intact Proprioception: Appears Intact Coordination Gross Motor Movements are Fluid and Coordinated: No Fine Motor Movements are Fluid and Coordinated: Yes Coordination and Movement Description: mild ataxia in the RLE Motor  Motor Motor: Hemiplegia Motor - Discharge Observations: mild R sided hemiplegia. and dysmetria Mobility  Bed Mobility Bed Mobility: Rolling Right;Rolling Left;Sit to Supine;Supine to Sit Rolling Right: Independent Rolling Left: Independent Supine to Sit: Independent Sit to Supine: Independent Transfers Sit to Stand: Supervision/Verbal cueing  Trunk/Postural Assessment  Cervical Assessment Cervical Assessment: Exceptions to [facility] Thoracic Assessment Thoracic Assessment: Exceptions to [facility] Lumbar Assessment Lumbar Assessment: Exceptions to [facility] Postural Control Postural Control: Within Functional Limits  Balance Balance Balance Assessed: Yes Standardized Balance Assessment Standardized Balance Assessment: Berg Balance Test Berg Balance Test Sit to Stand: Able to stand without using hands and stabilize independently Standing Unsupported: Able to stand safely 2  minutes Sitting with Back Unsupported but Feet Supported on Floor or Stool: Able to sit safely and securely 2 minutes Stand to Sit: Sits safely with minimal use of hands Transfers: Able to transfer safely, minor use of hands Standing Unsupported with Eyes Closed: Able to stand 10 seconds with supervision Standing Ubsupported with Feet Together: Able to place  feet together independently and stand for 1 minute with supervision From Standing, Reach Forward with Outstretched Arm: Can reach forward >12 cm safely (5") From Standing Position, Pick up Object from Floor: Able to pick up shoe, needs supervision From Standing Position, Turn to Look Behind Over each Shoulder: Looks behind from both sides and weight shifts well Turn 360 Degrees: Needs close supervision or verbal cueing Standing Unsupported, Alternately Place Feet on Step/Stool: Able to complete >2 steps/needs minimal assist Standing Unsupported, One Foot in Front: Able to plae foot ahead of the other independently and hold 30 seconds Standing on One Leg: Tries to lift leg/unable to hold 3 seconds but remains standing independently Total Score: 42 Static Sitting Balance Static Sitting - Level of Assistance: 7: Independent Dynamic Sitting Balance Dynamic Sitting - Level of Assistance: 6: Modified independent (Device/Increase time) Static Standing Balance Static Standing - Balance Support: No upper extremity supported Static Standing - Level of Assistance: 5: Stand by assistance Dynamic Standing Balance Dynamic Standing - Balance Support: During functional activity;Bilateral upper extremity supported Dynamic Standing - Level of Assistance: 5: Stand by assistance Extremity/Trunk Assessment RUE Assessment RUE Assessment: Within Functional Limits LUE Assessment LUE Assessment: Within Functional Limits   Gypsy Decant 12/09/2019, 2:45 PM

## 2019-12-09 NOTE — Care Management (Signed)
The overall goal for the admission was met for:   Discharge location: Home with daughter  Length of Stay: 10 days with discharge 12/10/19  Discharge activity level: Patient to discharge at a supervision level  Home/community participation: Limited Participation  Services provided included: MD, RD, PT, OT, SLP, RN, CM, TR, Pharmacy, [place]: Private Insurance: [employer]  Follow-up services arranged: Home Health: PT, OT and Patient/Family has no preference for HH/DME agencies  Comments (or additional information): [facility] for 20 hours post discharge application sent in to [facility]  Patient/Family verbalized understanding of follow-up arrangements: Yes  Family Education scheduled for 12/09/19  Individual responsible for coordination of the follow-up plan: Daughter: Elfrieda Espino [phone]  DME: TTB   Agency: [facility]  Confirmed delivery of DME: 12/10/19 DBS  Dorien Chihuahua B--

## 2019-12-10 ENCOUNTER — Ambulatory Visit: Payer: PPO | Admitting: Cardiology

## 2019-12-10 DIAGNOSIS — I6349 Cerebral infarction due to embolism of other cerebral artery: Secondary | ICD-10-CM

## 2019-12-10 NOTE — Plan of Care (Signed)
  Problem: RH BLADDER ELIMINATION Goal: RH STG MANAGE BLADDER WITH ASSISTANCE Description: STG Manage Bladder With Assistance, Min Outcome: Completed/Met   Problem: RH SKIN INTEGRITY Goal: RH STG SKIN FREE OF INFECTION/BREAKDOWN Description: Free of breakdown and infection with min assist Outcome: Completed/Met Goal: RH STG MAINTAIN SKIN INTEGRITY WITH ASSISTANCE Description: STG Maintain Skin Integrity With Assistance. Min Outcome: Completed/Met Goal: RH STG ABLE TO PERFORM INCISION/WOUND CARE W/ASSISTANCE Description: STG Able To Perform Incision/Wound Care With Assistance. Min Outcome: Completed/Met   Problem: RH SAFETY Goal: RH STG ADHERE TO SAFETY PRECAUTIONS W/ASSISTANCE/DEVICE Description: STG Adhere to Safety Precautions With Assistance/Device. Mod I Outcome: Completed/Met Goal: RH STG DECREASED RISK OF FALL WITH ASSISTANCE Description: STG Decreased Risk of Fall With mod I Assistance. Outcome: Completed/Met   Problem: RH KNOWLEDGE DEFICIT Goal: RH STG INCREASE KNOWLEDGE OF HYPERTENSION Description: Patient will be able to describe management of hypertension including medication, diet and exercise with cues, handouts Outcome: Completed/Met Goal: RH STG INCREASE KNOWLEGDE OF HYPERLIPIDEMIA Description: Patient will be able to describe management of hyperlipidemia with cues, handouts Outcome: Completed/Met Goal: RH STG INCREASE KNOWLEDGE OF STROKE PROPHYLAXIS Description: Patient will be able to describe prevention measures against future stroke with cues, handouts Outcome: Completed/Met

## 2019-12-10 NOTE — Progress Notes (Signed)
Patient transported via wheelchair by nurse tech  to personal vehicle for discharge home. Patient denies pain. Accompanied by family member.

## 2019-12-10 NOTE — Discharge Instructions (Signed)
Inpatient Rehab Discharge Instructions  Yolanda Flowers Discharge date and time:  12/10/19  Activities/Precautions/ Functional Status: Activity: no lifting, driving, or strenuous exercise till cleared by MD. Continue sternal precautions.  Diet: cardiac diet Wound Care: keep wound clean and dry   Functional status:  ___ No restrictions     ___ Walk up steps independently _X__ 24/7 supervision/assistance   ___ Walk up steps with assistance ___ Intermittent supervision/assistance  ___ Bathe/dress independently ___ Walk with walker     _X__ Bathe/dress with assistance ___ Walk Independently    ___ Shower independently ___ Walk with assistance    ___ Shower with assistance _X__ No alcohol     ___ Return to work/school ________   COMMUNITY REFERRALS UPON DISCHARGE:  Home Health: PT, OT  Agency:[facility] Phone:[phone]  DME: TTB  Agency : [facility]  Special Instructions: 1. ASA stops May 1st and Plavix to continue indefinitely.  2. Continue sternal precautions till follow up with Dr. Cyndia Bent.     STROKE/TIA DISCHARGE INSTRUCTIONS SMOKING Cigarette smoking nearly doubles your risk of having a stroke & is the single most alterable risk factor  If you smoke or have smoked in the last 12 months, you are advised to quit smoking for your health.  Most of the excess cardiovascular risk related to smoking disappears within a year of stopping.  Ask you doctor about anti-smoking medications   Quit Line: 1-800-QUIT NOW  Free Smoking Cessation Classes [phone]  CHOLESTEROL Know your levels; limit fat & cholesterol in your diet  Lipid Panel     Component Value Date/Time   CHOL 91 11/25/2019 2139   CHOL 139 10/25/2019 0948   TRIG 104 11/25/2019 2139   HDL 33 (L) 11/25/2019 2139   HDL 43 10/25/2019 0948   CHOLHDL 2.8 11/25/2019 2139   VLDL 21 11/25/2019 2139   LDLCALC 37 11/25/2019 2139   Brandon 74 10/25/2019 0948      Many patients benefit  from treatment even if their cholesterol is at goal.  Goal: Total Cholesterol (CHOL) less than 160  Goal:  Triglycerides (TRIG) less than 150  Goal:  HDL greater than 40  Goal:  LDL (LDLCALC) less than 100   BLOOD PRESSURE American Stroke Association blood pressure target is less that 120/80 mm/Hg  Your discharge blood pressure is:  BP: 113/80  Monitor your blood pressure  Limit your salt and alcohol intake  Many individuals will require more than one medication for high blood pressure  DIABETES (A1c is a blood sugar average for last 3 months) Goal HGBA1c is under 7% (HBGA1c is blood sugar average for last 3 months)  Diabetes:     Lab Results  Component Value Date   HGBA1C 5.3 11/25/2019     Your HGBA1c can be lowered with medications, healthy diet, and exercise.  Check your blood sugar as directed by your physician  Call your physician if you experience unexplained or low blood sugars.  PHYSICAL ACTIVITY/REHABILITATION Goal is 30 minutes at least 4 days per week  Activity: Increase activity slowly, and No driving, Therapies: see above Return to work: N/a  Activity decreases your risk of heart attack and stroke and makes your heart stronger.  It helps control your weight and blood pressure; helps you relax and can improve your mood.  Participate in a regular exercise program.  Talk with your doctor about the best form of exercise for you (dancing, walking, swimming, cycling).  DIET/WEIGHT Goal is to maintain a  healthy weight  Your discharge diet is:  Diet Order            Diet Heart Room service appropriate? Yes; Fluid consistency: Thin  Diet effective now             liquids Your height is:  Height: 5\' 6"  (167.6 cm) Your current weight is: 160 lbs Your Body Mass Index (BMI) is:  BMI (Calculated): 25.88  Following the type of diet specifically designed for you will help prevent another stroke.  Your goal weight range is: 155 lbs  Your goal Body Mass Index  (BMI) is 19-24.  Healthy food habits can help reduce 3 risk factors for stroke:  High cholesterol, hypertension, and excess weight.  RESOURCES Stroke/Support Group:  Call [phone]   STROKE EDUCATION PROVIDED/REVIEWED AND GIVEN TO PATIENT Stroke warning signs and symptoms How to activate emergency medical system (call 911). Medications prescribed at discharge. Need for follow-up after discharge. Personal risk factors for stroke. Pneumonia vaccine given:  Flu vaccine given:  My questions have been answered, the writing is legible, and I understand these instructions.  I will adhere to these goals & educational materials that have been provided to me after my discharge from the hospital.       My questions have been answered and I understand these instructions. I will adhere to these goals and the provided educational materials after my discharge from the hospital.  Patient/Caregiver Signature _______________________________ Date __________  Clinician Signature _______________________________________ Date __________  Please bring this form and your medication list with you to all your follow-up doctor's appointments.

## 2019-12-10 NOTE — Progress Notes (Signed)
[facility] PHYSICAL MEDICINE & REHABILITATION PROGRESS NOTE   Subjective/Complaints:   No issues overnite Pt excited about d/c  ROS: Pt denies SOB, abd pain, CP, N/V/C/D, and vision changes  Objective:   No results found. No results for input(s): WBC, HGB, HCT, PLT in the last 72 hours. No results for input(s): NA, K, CL, CO2, GLUCOSE, BUN, CREATININE, CALCIUM in the last 72 hours.  Intake/Output Summary (Last 24 hours) at 12/10/2019 0900 Last data filed at 12/09/2019 1900 Gross per 24 hour  Intake 222 ml  Output --  Net 222 ml     Physical Exam: Vital Signs Blood pressure 126/82, pulse 84, temperature 97.7 F (36.5 C), temperature source Oral, resp. rate 18, height 5\' 6"  (1.676 m), weight 74.3 kg, SpO2 97 %.  General: No acute distress Mood and affect are appropriate Heart: Regular rate and rhythm no rubs murmurs or extra sounds Lungs: Clear to auscultation, breathing unlabored, no rales or wheezes Abdomen: Positive bowel sounds, soft nontender to palpation, nondistended Extremities: No clubbing, cyanosis, or edema Skin: No evidence of breakdown, no evidence of rash Neurologic: Cranial nerves II through XII intact, motor strength is 5/5 in bilateral deltoid, bicep, tricep, grip, hip flexor, knee extensors, ankle dorsiflexor and plantar flexor   Assessment/Plan: 1. Functional deficits secondary to embolic stroke  Stable for D/C today F/u PCP in 3-4 weeks F/u PM&R 2 weeks See D/C summary See D/C instructions Care Tool:  Bathing    Body parts bathed by patient: Right arm, Left arm, Chest, Abdomen, Front perineal area, Right upper leg, Left upper leg, Face, Buttocks, Right lower leg, Left lower leg   Body parts bathed by helper: Buttocks     Bathing assist Assist Level: Supervision/Verbal cueing     Upper Body Dressing/Undressing Upper body dressing   What is the patient wearing?: Pull over shirt    Upper body assist Assist Level: Supervision/Verbal cueing     Lower Body Dressing/Undressing Lower body dressing      What is the patient wearing?: Pants, Underwear/pull up     Lower body assist Assist for lower body dressing: Supervision/Verbal cueing     Toileting Toileting Toileting Activity did not occur (Clothing management and hygiene only): N/A (no void or bm)  Toileting assist Assist for toileting: Supervision/Verbal cueing     Transfers Chair/bed transfer  Transfers assist     Chair/bed transfer assist level: Supervision/Verbal cueing     Locomotion Ambulation   Ambulation assist      Assist level: Supervision/Verbal cueing Assistive device: Walker-rolling Max distance: 150   Walk 10 feet activity   Assist     Assist level: Supervision/Verbal cueing Assistive device: Walker-rolling   Walk 50 feet activity   Assist Walk 50 feet with 2 turns activity did not occur: Safety/medical concerns  Assist level: Supervision/Verbal cueing Assistive device: Walker-rolling    Walk 150 feet activity   Assist    Assist level: Supervision/Verbal cueing Assistive device: Walker-rolling    Walk 10 feet on uneven surface  activity   Assist Walk 10 feet on uneven surfaces activity did not occur: Safety/medical concerns   Assist level: Supervision/Verbal cueing     Wheelchair     Assist Will patient use wheelchair at discharge?: No   Wheelchair activity did not occur: N/A         Wheelchair 50 feet with 2 turns activity    Assist    Wheelchair 50 feet with 2 turns activity did not occur: N/A  Wheelchair 150 feet activity     Assist  Wheelchair 150 feet activity did not occur: N/A       Blood pressure 126/82, pulse 84, temperature 97.7 F (36.5 C), temperature source Oral, resp. rate 18, height 5\' 6"  (1.676 m), weight 74.3 kg, SpO2 97 %.  Medical Problem List and Plan: 1.  Impaired Function, ADLs and moblity secondary to B/L embolic strokes- thalamus, corpus callosum,  and Basal ganglia             -patient may shower- still has sternal precautions from previous CABG 11/04/19             -ELOS/Goals: 2-2.5 weeks; mod I to Supervision  -Continue CIR PT, OT, SLP 2.  Antithrombotics: -DVT/anticoagulation:  Lovenox d/ced as ambulating >150 feet             -antiplatelet therapy: ASA to stop after 3 weeks and Plavix to continue.  3. Chronic back pain.Pain Management: Worse with activity--used oxycodone--does not want tramadol "it causes stroke"  4/22: PRN tramadol added for moderate-severe low back pain; greatly helped with pain. No longer with hand tremors. Discussed that this med is preferable to oxycodone given side effect profile  4. Mood: LCSW to follow for evaluation and support             -antipsychotic agents: N/A 5. Neuropsych: This patient is capable of making decisions on her own behalf. 6. Skin/Wound Care: Routine pressure relief measures. Cellulitis has resolved.  7. Fluids/Electrolytes/Nutrition: Monitor I/O. Check lytes in am. 8. CAD s/p CABG 10/29/19: Continue sternal precautions.  4/18- Sternal precautions until 01/04/20 9. Allergies: Uses flonase at nights.  10. HTN: Monitor BP tid--continue to monitor. On metropolol 12.5 daily.    Vitals:   12/10/19 0503 12/10/19 0813  BP: (!) 148/84 126/82  Pulse: 72 84  Resp: 18   Temp: 97.7 F (36.5 C)   SpO2: 97%    11.  Thrombocytosis: Leucocytosis due to hydrea--Vitamin D 50,000 units every 2 weeks. She has been evaluated by Hem/Onc--WBC around 18, 0000 due to hydrea. Will continue to monitor for fevers or other signs of infection.   13. Dyslipidemia: On Lipitor 14. Constipation: improved off oxy 15. Hyperkalemia: K+ 5.7 today. Kayexalate 28mL x1. Repeat BMP tomorrow  4/15: K+ has improved to 3.9.  16. Bleeding from loop recorder and IV sites:  Resolved .  17. AKI: Cr trending downward. 4/19: normalized.  18. GERD/reflux  4/18- changed protonix to 5:30-6 am in AM so 1 hr away from food and  other meds- per ptr request- wants to be woken up to receive it.    LOS: 10 days A FACE TO FACE EVALUATION WAS PERFORMED  Yolanda Flowers 12/10/2019, 9:00 AM

## 2019-12-14 ENCOUNTER — Other Ambulatory Visit: Payer: Self-pay | Admitting: Physical Medicine and Rehabilitation

## 2019-12-14 ENCOUNTER — Telehealth: Payer: Self-pay

## 2019-12-14 DIAGNOSIS — D696 Thrombocytopenia, unspecified: Secondary | ICD-10-CM | POA: Diagnosis not present

## 2019-12-14 DIAGNOSIS — I509 Heart failure, unspecified: Secondary | ICD-10-CM | POA: Diagnosis not present

## 2019-12-14 DIAGNOSIS — R1310 Dysphagia, unspecified: Secondary | ICD-10-CM | POA: Diagnosis not present

## 2019-12-14 DIAGNOSIS — Z87891 Personal history of nicotine dependence: Secondary | ICD-10-CM | POA: Diagnosis not present

## 2019-12-14 DIAGNOSIS — D72829 Elevated white blood cell count, unspecified: Secondary | ICD-10-CM

## 2019-12-14 DIAGNOSIS — M1990 Unspecified osteoarthritis, unspecified site: Secondary | ICD-10-CM | POA: Diagnosis not present

## 2019-12-14 DIAGNOSIS — F419 Anxiety disorder, unspecified: Secondary | ICD-10-CM | POA: Diagnosis not present

## 2019-12-14 DIAGNOSIS — I110 Hypertensive heart disease with heart failure: Secondary | ICD-10-CM | POA: Diagnosis not present

## 2019-12-14 DIAGNOSIS — G4733 Obstructive sleep apnea (adult) (pediatric): Secondary | ICD-10-CM | POA: Diagnosis not present

## 2019-12-14 DIAGNOSIS — D62 Acute posthemorrhagic anemia: Secondary | ICD-10-CM

## 2019-12-14 DIAGNOSIS — I69398 Other sequelae of cerebral infarction: Secondary | ICD-10-CM | POA: Diagnosis not present

## 2019-12-14 DIAGNOSIS — I2510 Atherosclerotic heart disease of native coronary artery without angina pectoris: Secondary | ICD-10-CM | POA: Diagnosis not present

## 2019-12-14 DIAGNOSIS — H5462 Unqualified visual loss, left eye, normal vision right eye: Secondary | ICD-10-CM | POA: Diagnosis not present

## 2019-12-14 DIAGNOSIS — K219 Gastro-esophageal reflux disease without esophagitis: Secondary | ICD-10-CM | POA: Diagnosis not present

## 2019-12-14 DIAGNOSIS — Z951 Presence of aortocoronary bypass graft: Secondary | ICD-10-CM | POA: Diagnosis not present

## 2019-12-14 DIAGNOSIS — I69354 Hemiplegia and hemiparesis following cerebral infarction affecting left non-dominant side: Secondary | ICD-10-CM | POA: Diagnosis not present

## 2019-12-14 DIAGNOSIS — Z95818 Presence of other cardiac implants and grafts: Secondary | ICD-10-CM | POA: Diagnosis not present

## 2019-12-14 DIAGNOSIS — J449 Chronic obstructive pulmonary disease, unspecified: Secondary | ICD-10-CM | POA: Diagnosis not present

## 2019-12-14 HISTORY — DX: Acute posthemorrhagic anemia: D62

## 2019-12-14 HISTORY — DX: Elevated white blood cell count, unspecified: D72.829

## 2019-12-14 NOTE — Telephone Encounter (Signed)
Transitional Care call-dsughter-[name]    1. Are you/is patient experiencing any problems since coming home? No other don't want to do anything  Are there any questions regarding any aspect of care? Stay not long enough 2. Are there any questions regarding medications administration/dosing? No Are meds being taken as prescribed? Yes Patient should review meds with caller to confirm 3. Have there been any falls? No 4. Has Home Health been to the house and/or have they contacted you? Yes If not, have you tried to contact them? Can we help you contact them? 5. Are bowels and bladder emptying properly?Yes Are there any unexpected incontinence issues? No If applicable, is patient following bowel/bladder programs? 6. Any fevers, problems with breathing, unexpected pain? No 7. Are there any skin problems or new areas of breakdown? No 8. Has the patient/family member arranged specialty MD follow up (ie cardiology/neurology/renal/surgical/etc)? Yes Can we help arrange? 9. Does the patient need any other services or support that we can help arrange? No 10. Are caregivers following through as expected in assisting the patient? Yes 11. Has the patient quit smoking, drinking alcohol, or using drugs as recommended? Yes  Appointment time 1:30 pm, arrive time 1:10 pm and with Dr. Letta Pate. [place]

## 2019-12-17 ENCOUNTER — Other Ambulatory Visit: Payer: Self-pay | Admitting: Surgical

## 2019-12-20 ENCOUNTER — Telehealth: Payer: Self-pay | Admitting: Cardiology

## 2019-12-20 NOTE — Telephone Encounter (Signed)
New Message   Patient is requesting her daughter come with her to her appt due to needing assistance since she has had a stroke.

## 2019-12-21 ENCOUNTER — Other Ambulatory Visit: Payer: Self-pay

## 2019-12-21 ENCOUNTER — Ambulatory Visit: Payer: PPO | Admitting: Cardiology

## 2019-12-21 ENCOUNTER — Encounter: Payer: PPO | Admitting: Physical Medicine & Rehabilitation

## 2019-12-21 ENCOUNTER — Other Ambulatory Visit: Payer: Self-pay | Admitting: Surgery

## 2019-12-21 ENCOUNTER — Encounter: Payer: Self-pay | Admitting: Cardiology

## 2019-12-21 VITALS — BP 112/80 | HR 68 | Ht 66.0 in | Wt 160.0 lb

## 2019-12-21 DIAGNOSIS — I6349 Cerebral infarction due to embolism of other cerebral artery: Secondary | ICD-10-CM | POA: Diagnosis not present

## 2019-12-21 DIAGNOSIS — E782 Mixed hyperlipidemia: Secondary | ICD-10-CM

## 2019-12-21 DIAGNOSIS — I2510 Atherosclerotic heart disease of native coronary artery without angina pectoris: Secondary | ICD-10-CM

## 2019-12-21 DIAGNOSIS — Z951 Presence of aortocoronary bypass graft: Secondary | ICD-10-CM

## 2019-12-21 DIAGNOSIS — N289 Disorder of kidney and ureter, unspecified: Secondary | ICD-10-CM | POA: Diagnosis not present

## 2019-12-21 DIAGNOSIS — I10 Essential (primary) hypertension: Secondary | ICD-10-CM | POA: Diagnosis not present

## 2019-12-21 MED ORDER — CLOPIDOGREL BISULFATE 75 MG PO TABS: 75.0000 mg | ORAL_TABLET | Freq: Every day | ORAL | 3 refills | Status: DC

## 2019-12-21 MED ORDER — ATORVASTATIN CALCIUM 80 MG PO TABS: 80.0000 mg | ORAL_TABLET | Freq: Every day | ORAL | 3 refills | Status: DC

## 2019-12-21 MED ORDER — METOPROLOL TARTRATE 25 MG PO TABS: 25.0000 mg | ORAL_TABLET | Freq: Two times a day (BID) | ORAL | 3 refills | Status: DC

## 2019-12-21 MED ORDER — ATORVASTATIN CALCIUM 80 MG PO TABS: 80.0000 mg | ORAL_TABLET | Freq: Every day | ORAL | 0 refills | Status: DC

## 2019-12-21 MED ORDER — PANTOPRAZOLE SODIUM 40 MG PO TBEC: 40.0000 mg | DELAYED_RELEASE_TABLET | Freq: Every day | ORAL | 3 refills | Status: DC

## 2019-12-21 MED ORDER — METOPROLOL TARTRATE 25 MG PO TABS: 25.0000 mg | ORAL_TABLET | Freq: Two times a day (BID) | ORAL | 0 refills | Status: DC

## 2019-12-21 NOTE — Patient Instructions (Signed)
Medication Instructions:  No medication chnages *If you need a refill on your cardiac medications before your next appointment, please call your pharmacy*   Lab Work: Your physician recommends that you return for lab work in: 1 month. You need to have labs done when you are fasting.  You can come Monday through Friday 8:30 am to 12:00 pm and 1:15 to 4:30. You do not need to make an appointment as the order has already been placed. The labs you are going to have done are BMET, CBC, TSH, LFT and Lipids.   If you have labs (blood work) drawn today and your tests are completely normal, you will receive your results only by: Marland Kitchen MyChart Message (if you have MyChart) OR . A paper copy in the mail If you have any lab test that is abnormal or we need to change your treatment, we will call you to review the results.   Testing/Procedures: None ordered   Follow-Up: At [facility], you and your health needs are our priority.  As part of our continuing mission to provide you with exceptional heart care, we have created designated Provider Care Teams.  These Care Teams include your primary Cardiologist (physician) and Advanced Practice Providers (APPs -  Physician Assistants and Nurse Practitioners) who all work together to provide you with the care you need, when you need it.  We recommend signing up for the patient portal called "MyChart".  Sign up information is provided on this After Visit Summary.  MyChart is used to connect with patients for Virtual Visits (Telemedicine).  Patients are able to view lab/test results, encounter notes, upcoming appointments, etc.  Non-urgent messages can be sent to your provider as well.   To learn more about what you can do with MyChart, go to [email or web address].    Your next appointment:   3 month(s)  The format for your next appointment:   In Person  Provider:   Jyl Heinz, MD   Other Instructions NA

## 2019-12-21 NOTE — Progress Notes (Signed)
Cardiology Office Note:    Date:  12/21/2019   ID:  Yolanda Flowers, DOB October 10, 1940, MRN [id number]  PCP:  [name], MD  Cardiologist:  Jenean Lindau, MD   Referring MD: [name], MD    ASSESSMENT:    1. Coronary artery disease involving native coronary artery of native heart without angina pectoris   2. Combined hyperlipidemia   3. Essential hypertension   4. S/P CABG x 4   5. Renal insufficiency   6. Cerebrovascular accident (CVA) due to embolism of other cerebral artery ([place])    PLAN:    In order of problems listed above:  1. I reviewed records extensively from recent admission.  Recent stroke events were noted by me.  Patient is evaluated and treated by neurology. 2. Coronary artery disease post CABG: Secondary prevention stressed with the patient.  Importance of compliance with diet medication stressed and she vocalized understanding. Essential hypertension: Blood pressure is stable Mixed dyslipidemia: Diet was emphasized she will be back in 1 month for complete blood work including fasting lipids.  I will do CBC also. We will refill her medications today as her request. Her wound of graft harvest site is significantly better than the last time. Patient will be seen in follow-up appointment in 3 months or earlier if the patient has any concerns   Medication Adjustments/Labs and Tests Ordered: Current medicines are reviewed at length with the patient today.  Concerns regarding medicines are outlined above.  No orders of the defined types were placed in this encounter.  Meds ordered this encounter  Medications  . atorvastatin (LIPITOR) 80 MG tablet    Sig: Take 1 tablet (80 mg total) by mouth daily at 6 PM.    Dispense:  90 tablet    Refill:  0  . metoprolol tartrate (LOPRESSOR) 25 MG tablet    Sig: Take 1 tablet (25 mg total) by mouth 2 (two) times daily.    Dispense:  180 tablet    Refill:  0     No chief complaint on file.    History of Present  Illness:    Yolanda Flowers is a 79 y.o. female.  Patient has past medical history of coronary artery disease, CABG surgery, essential hypertension dyslipidemia.  Patient has had a stroke and was readmitted to the hospital.  She has had extensive evaluation for this and has been treated.  She denies any problems at this time she is recovering from it and getting rehabilitation.  At the time of my evaluation, the patient is alert awake oriented and in no distress.  Our colleagues have put in a loop recorder to assess for any arrhythmic causes for her stroke.  At the time of my evaluation, the patient is alert awake oriented and in no distress.  Past Medical History:  Diagnosis Date  . Abdominal pain   . Accelerating angina ([place]) 08/26/2019  . Allergic rhinitis 06/21/2014  . Anxiety   . Arthritis   . Asthma   . Atherosclerosis of arteries 08/24/2019  . Bilateral edema of lower extremity   . Body mass index 29.0-29.9, adult 08/24/2019  . CAD (coronary artery disease) 10/25/2019  . Cellulitis of right leg   . Combined hyperlipidemia 08/24/2019  . Complication of anesthesia    difficulty waking  . Congestive heart failure (CHF) ([place])   . COPD (chronic obstructive pulmonary disease) ([place])   . Coronary artery disease   . Dyspnea 02/09/2019  . Dyspnea on exertion   .  Embolic stroke ([place]) [id number]  . Essential thrombocytosis ([place]) 06/22/2014  . Gastrointestinal bleeding 06/23/2018  . GERD (gastroesophageal reflux disease)   . History of melanoma excision    left greast toe 2015  . History of squamous cell carcinoma excision    face--  multiple excisions  . History of subdural hematoma    2008  . HTN (hypertension) 06/21/2014  . Hypertension   . Ischial bursitis, right 08/24/2019  . Malignant melanoma of great toe ([place]) 08/24/2019  . Melanoma in situ ([place]) 06/29/2014  . OSA (obstructive sleep apnea)    intolerant  . Pedal edema 08/24/2019  . Renal insufficiency 08/24/2019  . S/P CABG x 4 11/04/2019  .  Skin cancer 08/24/2019  . Solitary pulmonary nodule on lung CT 02/09/2019  . Squamous cell carcinoma, scalp/neck 08/24/2019  . Stroke due to embolism ([place]) 11/25/2019  . Swelling of limb 08/24/2019  . Thrombocytosis ([place])    takes hydoxyurea--  MONITORED BY DR Hinton Rao [facility])  . VIN III (vulvar intraepithelial neoplasia III)   . Vitamin B 12 deficiency 08/24/2019  . Vitamin D deficiency 08/24/2019  . Wears dentures    UPPER AND LOWER PARTIAL  . Wears glasses     Past Surgical History:  Procedure Laterality Date  . ABDOMINAL HYSTERECTOMY  age 60  . CARDIAC CATHETERIZATION  03/172021  . CORONARY ARTERY BYPASS GRAFT N/A 11/04/2019   Procedure: CORONARY ARTERY BYPASS GRAFTING (CABG) x 4, with ENDOSCOPIC HARVESTING OF RIGHT GREATER SAPHENOUS VEIN.;  Surgeon: Gaye Pollack, MD;  Location: [place] OR;  Service: Open Heart Surgery;  Laterality: N/A;  . INTRAVASCULAR ULTRASOUND/IVUS N/A 11/03/2019   Procedure: Intravascular Ultrasound/IVUS;  Surgeon: Nelva Bush, MD;  Location: [place] CV LAB;  Service: Cardiovascular;  Laterality: N/A;  . KNEE ARTHROSCOPY Left 2004  . LEFT HEART CATH AND CORONARY ANGIOGRAPHY N/A 11/03/2019   Procedure: LEFT HEART CATH AND CORONARY ANGIOGRAPHY;  Surgeon: Nelva Bush, MD;  Location: [place] CV LAB;  Service: Cardiovascular;  Laterality: N/A;  . LOOP RECORDER INSERTION N/A 11/30/2019   Procedure: LOOP RECORDER INSERTION;  Surgeon: Thompson Grayer, MD;  Location: [place] [facility];  Service: Cardiovascular;  Laterality: N/A;  . MELANOMA EXCISION  2015   left great toe  . REPAIR PERONEAL TENDONS ANKLE  2004  . SUBDURAL HEMATOMA EVACUATION VIA CRANIOTOMY  2008      week later  post-op  [facility]  . TEE WITHOUT CARDIOVERSION N/A 11/04/2019   Procedure: TRANSESOPHAGEAL ECHOCARDIOGRAM (TEE);  Surgeon: Gaye Pollack, MD;  Location: [place];  Service: Open Heart Surgery;  Laterality: N/A;  . VULVECTOMY N/A 10/24/2015   Procedure: WIDE LOCAL  EXCISION OF THE VULVA ;  Surgeon: Everitt Amber, MD;  Location: [place];  Service: Gynecology;  Laterality: N/A;    Current Medications: Current Meds  Medication Sig  . acetaminophen (TYLENOL) 325 MG tablet Take 1-2 tablets (325-650 mg total) by mouth every 4 (four) hours as needed for mild pain.  Marland Kitchen ammonium lactate (LAC-HYDRIN) 12 % lotion Apply 1 application topically daily as needed (skin spots).   . Ascorbic Acid (VITAMIN C) 1000 MG tablet Take 1,000 mg by mouth daily.  Marland Kitchen atorvastatin (LIPITOR) 80 MG tablet Take 1 tablet (80 mg total) by mouth daily at 6 PM.  . Calcium Carb-Cholecalciferol (CALCIUM 600 + D PO) Take 1 tablet by mouth in the morning and at bedtime.   . clopidogrel (PLAVIX) 75 MG tablet Take 1 tablet (75 mg  total) by mouth daily.  . ferrous [id number] C-folic acid (TRINSICON / FOLTRIN) capsule Take 1 capsule by mouth 2 (two) times daily after a meal.  . fluticasone (FLONASE) 50 MCG/ACT nasal spray Place 2 sprays into both nostrils at bedtime.   . hydrocerin (EUCERIN) CREA Apply 1 application topically 2 (two) times daily.  . hydroxyurea (HYDREA) 500 MG capsule Take 500 mg by mouth daily.   Marland Kitchen lidocaine (LIDODERM) 5 % Place 1 patch onto the skin daily. Remove & Discard patch within 12 hours or as directed by MD  . Magnesium 250 MG TABS Take 250 mg by mouth at bedtime.   . metoprolol tartrate (LOPRESSOR) 25 MG tablet Take 1 tablet (25 mg total) by mouth 2 (two) times daily.  . Multiple Vitamins-Minerals (PRESERVISION AREDS 2) CAPS Take 1 capsule by mouth in the morning and at bedtime.  . pantoprazole (PROTONIX) 40 MG tablet Take 1 tablet (40 mg total) by mouth daily.  . traMADol (ULTRAM) 50 MG tablet Take 1 tablet (50 mg total) by mouth every 6 (six) hours as needed for moderate pain.  . Vitamin D, Ergocalciferol, (DRISDOL) 1.25 MG (50000 UT) CAPS capsule Take 50,000 Units by mouth every 14 (fourteen) days.  . [DISCONTINUED] atorvastatin (LIPITOR) 80 MG  tablet Take 1 tablet (80 mg total) by mouth daily at 6 PM.  . [DISCONTINUED] metoprolol tartrate (LOPRESSOR) 25 MG tablet Take 1 tablet (25 mg total) by mouth 2 (two) times daily.     Allergies:   Cephalexin, Ciprofloxacin, and Penicillins   Social History   Socioeconomic History  . Marital status: Married    Spouse name: Not on file  . Number of children: Not on file  . Years of education: Not on file  . Highest education level: Not on file  Occupational History  . Not on file  Tobacco Use  . Smoking status: Never Smoker  . Smokeless tobacco: Never Used  Substance and Sexual Activity  . Alcohol use: No  . Drug use: No  . Sexual activity: Not Currently  Other Topics Concern  . Not on file  Social History Narrative  . Not on file   Social Determinants of Health   Financial Resource Strain:   . Difficulty of Paying Living Expenses:   Food Insecurity:   . Worried About Charity fundraiser in the Last Year:   . Arboriculturist in the Last Year:   Transportation Needs:   . Film/video editor (Medical):   Marland Kitchen Lack of Transportation (Non-Medical):   Physical Activity:   . Days of Exercise per Week:   . Minutes of Exercise per Session:   Stress:   . Feeling of Stress :   Social Connections:   . Frequency of Communication with Friends and Family:   . Frequency of Social Gatherings with Friends and Family:   . Attends Religious Services:   . Active Member of Clubs or Organizations:   . Attends Archivist Meetings:   Marland Kitchen Marital Status:      Family History: The patient's family history includes Arthritis in her brother, father, mother, and sister; Asthma in her father; Clotting disorder in her mother; Emphysema in her father; Heart disease in her brother, father, mother, and sister; Hypertension in her brother, father, mother, and sister; Kidney failure in her father; Stroke in her father.  ROS:   Please see the history of present illness.    All other systems  reviewed and are negative.  EKGs/Labs/Other  Studies Reviewed:    The following studies were reviewed today: As mentioned above   Recent Labs: 02/09/2019: Pro B Natriuretic peptide (BNP) 70.0 11/05/2019: Magnesium 2.4 11/24/2019: TSH 3.390 12/01/2019: ALT 27 12/06/2019: BUN 12; Creatinine, Ser 1.01; Hemoglobin 10.6; Platelets 188; Potassium 3.9; Sodium 140  Recent Lipid Panel    Component Value Date/Time   CHOL 91 11/25/2019 2139   CHOL 139 10/25/2019 0948   TRIG 104 11/25/2019 2139   HDL 33 (L) 11/25/2019 2139   HDL 43 10/25/2019 0948   CHOLHDL 2.8 11/25/2019 2139   VLDL 21 11/25/2019 2139   LDLCALC 37 11/25/2019 2139   LDLCALC 74 10/25/2019 0948    Physical Exam:    VS:  BP 112/80   Pulse 68   Ht 5\' 6"  (1.676 m)   Wt 160 lb (72.6 kg)   SpO2 98%   BMI 25.82 kg/m     Wt Readings from Last 3 Encounters:  12/21/19 160 lb (72.6 kg)  12/10/19 163 lb 11.2 oz (74.3 kg)  11/25/19 162 lb (73.5 kg)     GEN: Patient is in no acute distress HEENT: Normal NECK: No JVD; No carotid bruits LYMPHATICS: No lymphadenopathy CARDIAC: Hear sounds regular, 2/6 systolic murmur at the apex. RESPIRATORY:  Clear to auscultation without rales, wheezing or rhonchi  ABDOMEN: Soft, non-tender, non-distended MUSCULOSKELETAL:  No edema; No deformity  SKIN: Warm and dry NEUROLOGIC:  Alert and oriented x 3 PSYCHIATRIC:  Normal affect   Signed, Jenean Lindau, MD  12/21/2019 2:47 PM    [facility]

## 2019-12-22 ENCOUNTER — Ambulatory Visit (INDEPENDENT_AMBULATORY_CARE_PROVIDER_SITE_OTHER): Payer: Self-pay | Admitting: Surgery

## 2019-12-22 ENCOUNTER — Other Ambulatory Visit: Payer: Self-pay

## 2019-12-22 ENCOUNTER — Encounter: Payer: Self-pay | Admitting: Surgery

## 2019-12-22 ENCOUNTER — Ambulatory Visit
Admission: RE | Admit: 2019-12-22 | Discharge: 2019-12-22 | Disposition: A | Discharge status: Home / Self Care | Payer: PPO | Source: Ambulatory Visit | Attending: Surgery | Admitting: Surgery

## 2019-12-22 VITALS — BP 130/87 | HR 65 | Temp 97.9°F | Resp 20 | Ht 66.0 in | Wt 159.0 lb

## 2019-12-22 DIAGNOSIS — Z951 Presence of aortocoronary bypass graft: Secondary | ICD-10-CM | POA: Diagnosis not present

## 2019-12-22 DIAGNOSIS — R911 Solitary pulmonary nodule: Secondary | ICD-10-CM | POA: Diagnosis not present

## 2019-12-22 IMAGING — DX DG CHEST 2V
2 series · 2 of 2 positions shown · non-contrast
Comparison: [DATE], [DATE], chest CT [DATE]

CLINICAL DATA: Post CABG x4.

EXAM:
CHEST - 2 VIEW

[dg chest 2 view (1 of 2)]
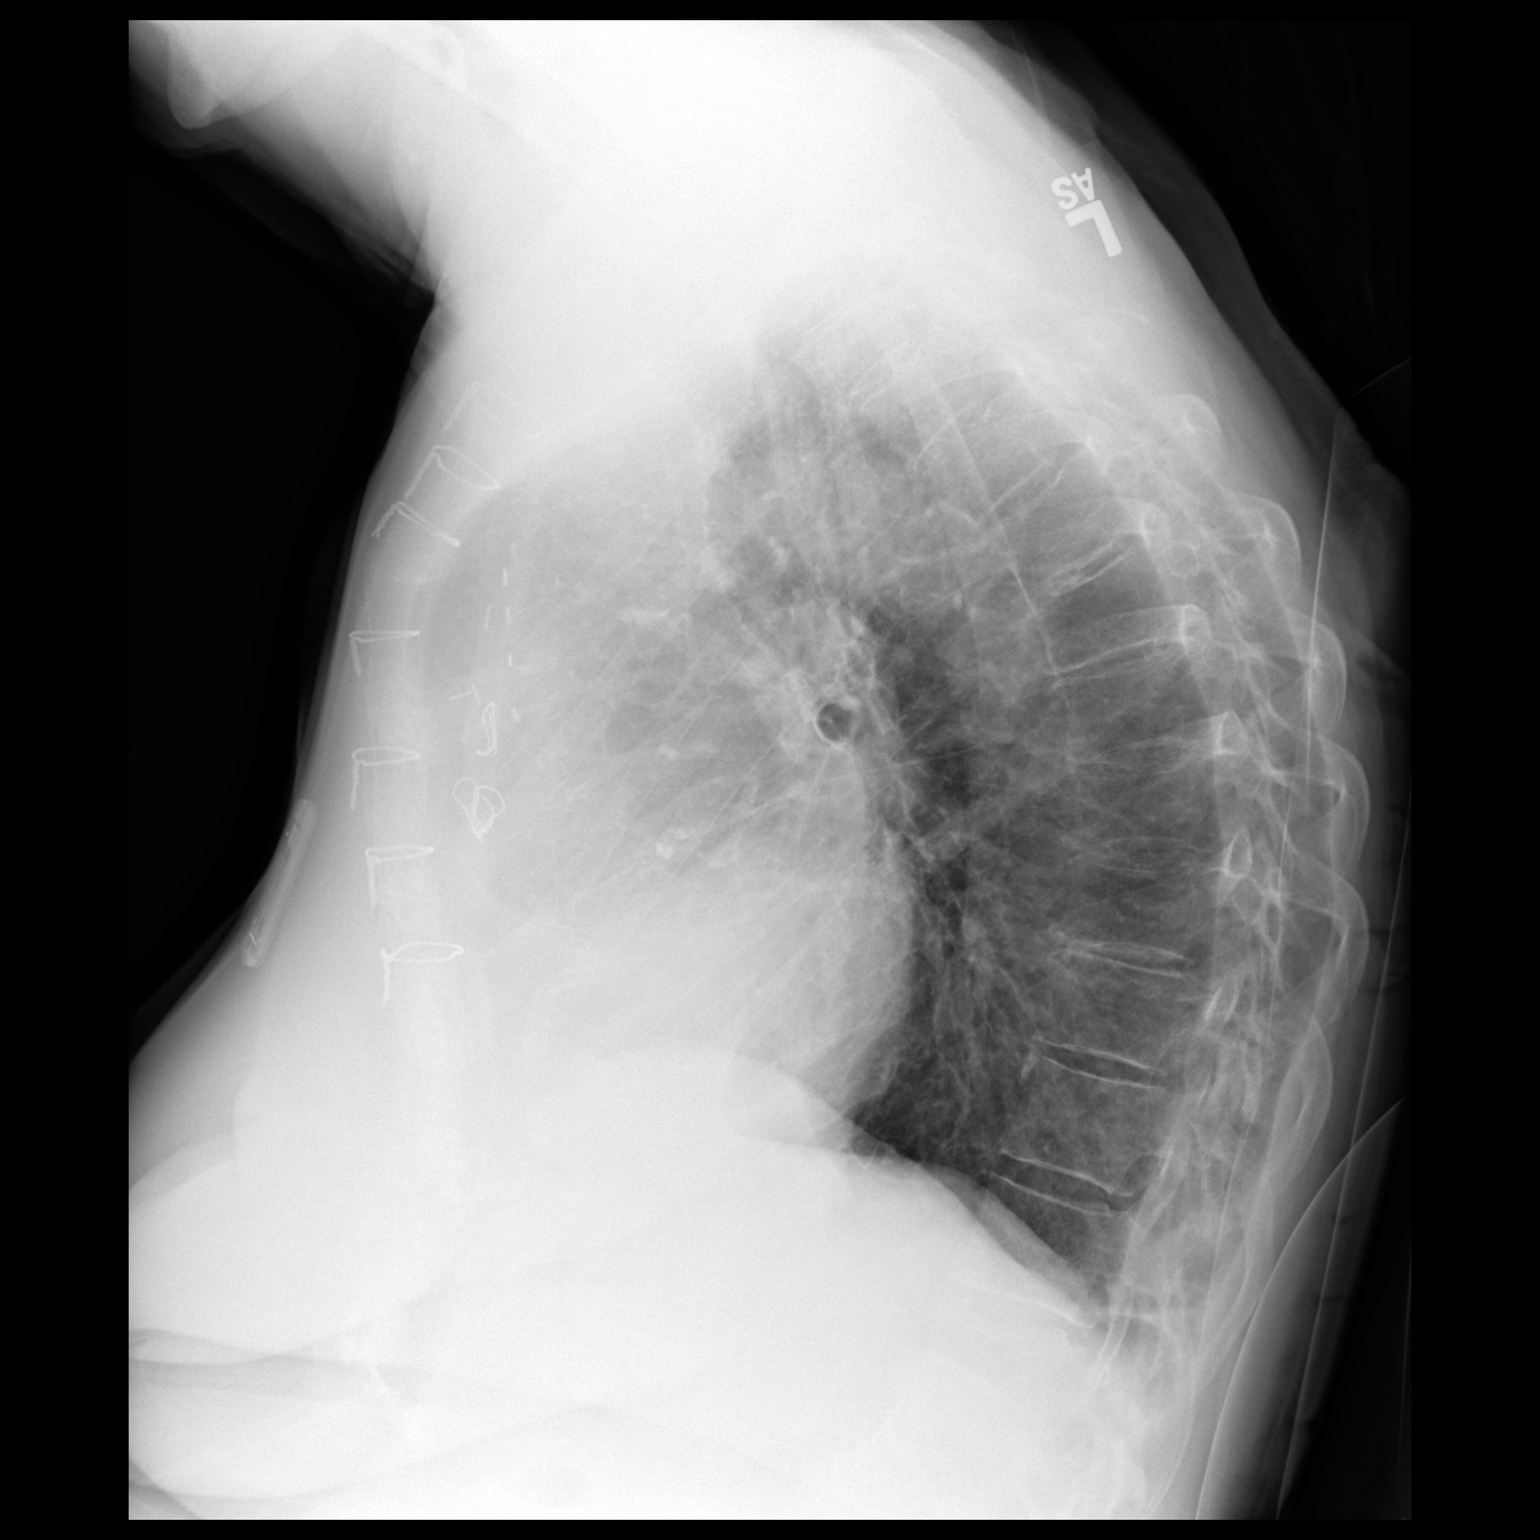

[dg chest 2 view (2 of 2)]
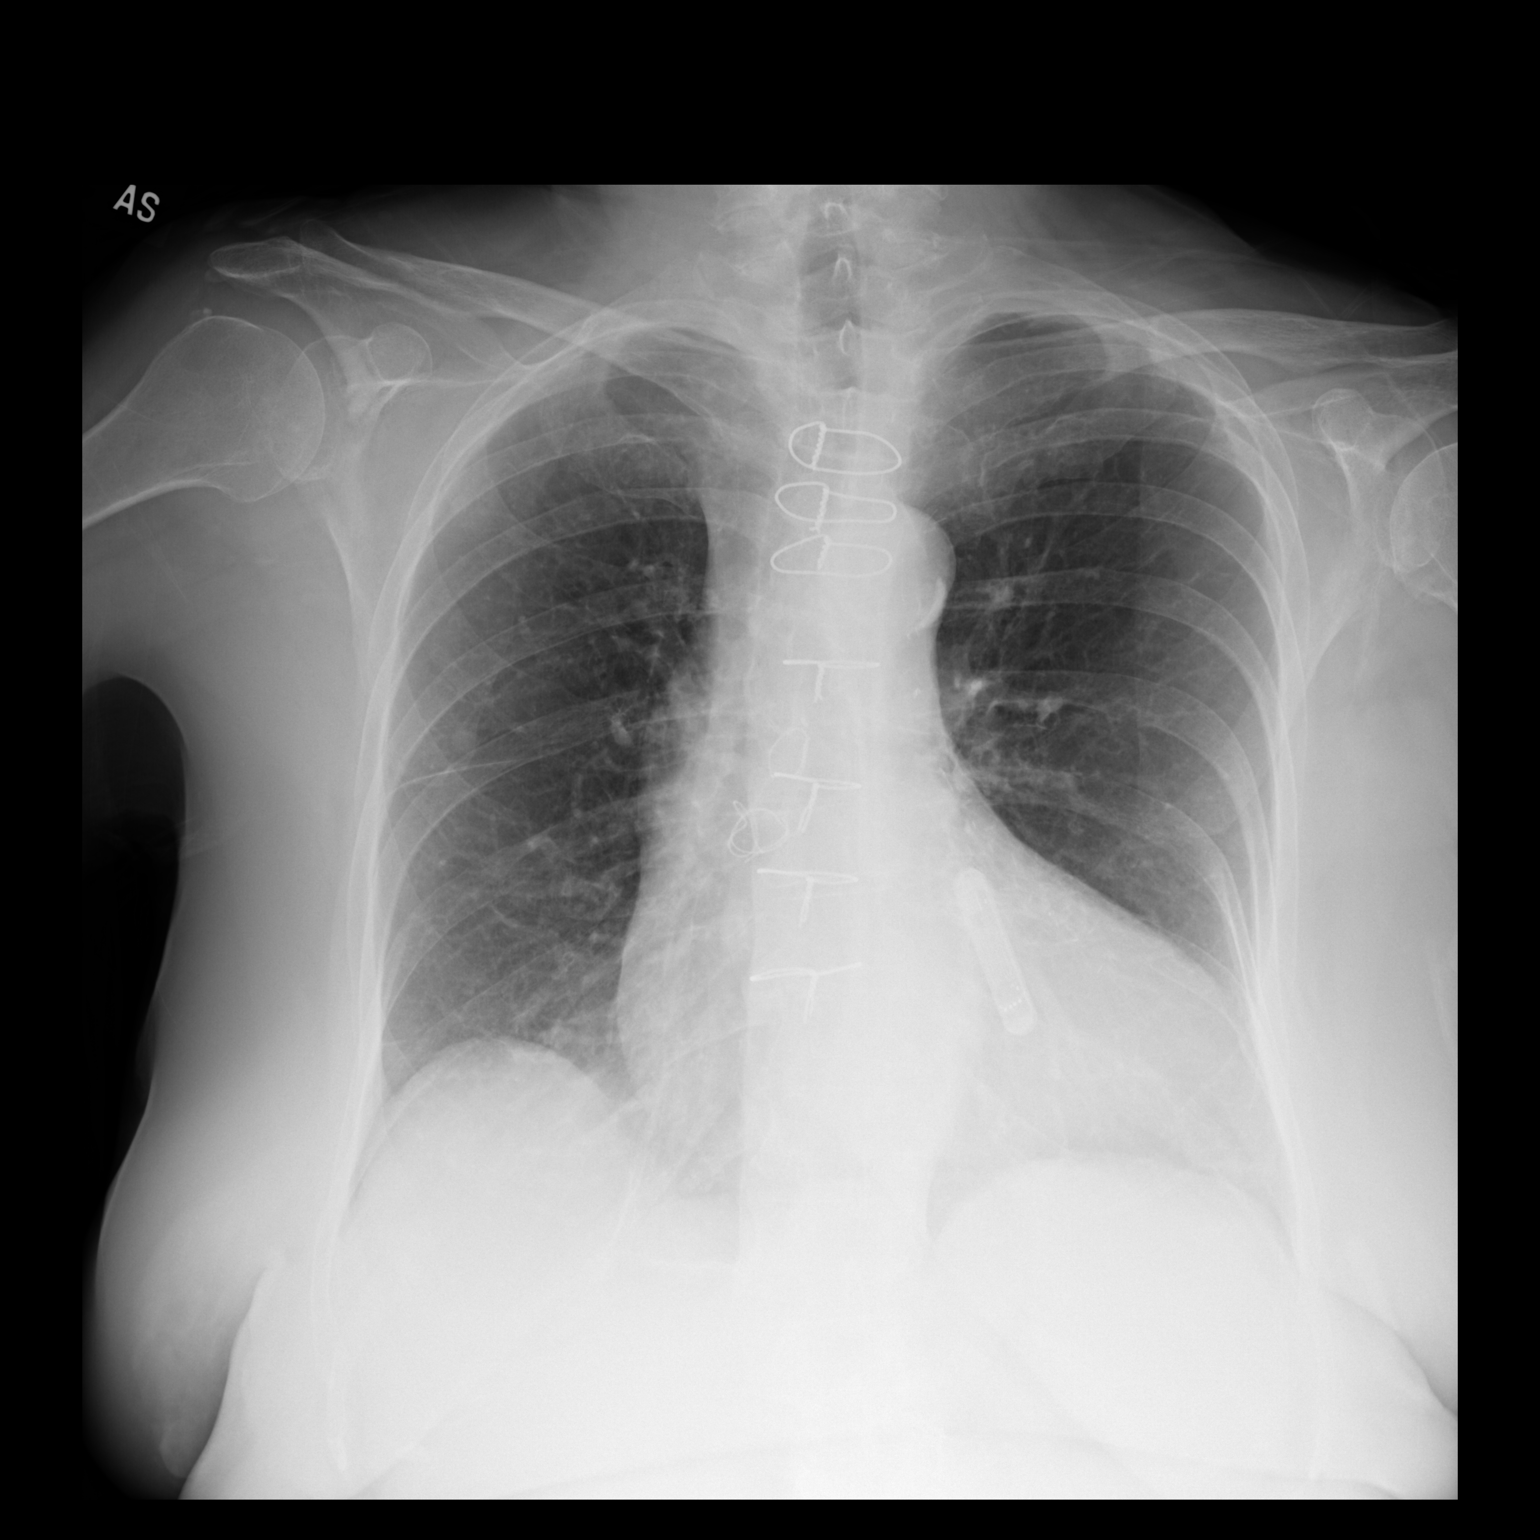

[2 of 2 positions shown; findings below may reference images not displayed]

FINDINGS: Post median sternotomy. Loop recorder over the left heart unchanged.
Lungs are adequately inflated without focal airspace consolidation
or effusion. Stable subcentimeter nodular density over the right
midlung as seen on previous chest CT. Cardiomediastinal silhouette
and remainder of the exam is unchanged.
IMPRESSION: 1.  No active cardiopulmonary disease.

2. Stable subcentimeter nodule over the right midlung. Patient is
due for follow-up CT [DATE].

## 2019-12-22 NOTE — Progress Notes (Signed)
HPI: Patient returns for routine postoperative follow-up having undergone coronary bypass graft surgery x4 on 11/04/2019. The patient's early postoperative recovery while in the hospital was notable for an uneventful postoperative course and she was discharged home on postoperative day #6.  About 2 days after returning home she woke up in the morning and noted vision loss in her left eye.  She saw an optometrist that told her that she most likely had vision loss due to emboli.  This did not improve and then on 11/25/2019 she woke up in the middle the night to go the bathroom and had marked weakness in her legs and could not walk.  She presented to the emergency room and a CT of the head showed no large vessel occlusion or hemodynamically significant stenosis.  An MRI of the brain showed scattered acute bilateral cerebral and cerebellar infarcts that were felt to be embolic.  She improved after admission and was sent to inpatient rehab where she continued to improve.  Since discharge from rehab on 12/09/2019 she feels like she has continued to get stronger.  She is walking outside with her husband using a cane.  She has had no chest discomfort with ambulation which is a marked improvement for her.  She denies any shortness of breath.    Current Outpatient Medications  Medication Sig Dispense Refill  . acetaminophen (TYLENOL) 325 MG tablet Take 1-2 tablets (325-650 mg total) by mouth every 4 (four) hours as needed for mild pain.    Marland Kitchen ammonium lactate (LAC-HYDRIN) 12 % lotion Apply 1 application topically daily as needed (skin spots).     . Ascorbic Acid (VITAMIN C) 1000 MG tablet Take 1,000 mg by mouth daily.    Marland Kitchen atorvastatin (LIPITOR) 80 MG tablet Take 1 tablet (80 mg total) by mouth daily at 6 PM. 90 tablet 3  . Calcium Carb-Cholecalciferol (CALCIUM 600 + D PO) Take 1 tablet by mouth in the morning and at bedtime.     . clopidogrel (PLAVIX) 75 MG tablet Take 1 tablet (75 mg total) by mouth daily. 90  tablet 3  . ferrous [id number] C-folic acid (TRINSICON / FOLTRIN) capsule Take 1 capsule by mouth 2 (two) times daily after a meal. 60 capsule 0  . fluticasone (FLONASE) 50 MCG/ACT nasal spray Place 2 sprays into both nostrils at bedtime.     . hydrocerin (EUCERIN) CREA Apply 1 application topically 2 (two) times daily. 454 g 0  . hydroxyurea (HYDREA) 500 MG capsule Take 500 mg by mouth daily.     Marland Kitchen lidocaine (LIDODERM) 5 % Place 1 patch onto the skin daily. Remove & Discard patch within 12 hours or as directed by MD 30 patch 0  . Magnesium 250 MG TABS Take 250 mg by mouth at bedtime.     . metoprolol tartrate (LOPRESSOR) 25 MG tablet Take 1 tablet (25 mg total) by mouth 2 (two) times daily. 180 tablet 3  . Multiple Vitamins-Minerals (PRESERVISION AREDS 2) CAPS Take 1 capsule by mouth in the morning and at bedtime.    . pantoprazole (PROTONIX) 40 MG tablet Take 1 tablet (40 mg total) by mouth daily. 90 tablet 3  . traMADol (ULTRAM) 50 MG tablet Take 1 tablet (50 mg total) by mouth every 6 (six) hours as needed for moderate pain. 30 tablet   . Vitamin D, Ergocalciferol, (DRISDOL) 1.25 MG (50000 UT) CAPS capsule Take 50,000 Units by mouth every 14 (fourteen) days.     No current facility-administered medications  for this visit.    Physical Exam: BP 130/87   Pulse 65   Temp 97.9 F (36.6 C) (Temporal)   Resp 20   Ht 5\' 6"  (1.676 m)   Wt 159 lb (72.1 kg)   SpO2 94% Comment: RA  BMI 25.66 kg/m  She looks well. Cardiac exam shows a regular rate and rhythm with normal heart sounds.  There is no murmur. Lungs are clear. The chest incision is well-healed and the sternum is stable. The right leg incisions are healing well.  There is a small amount of eschar in the right lower leg incision with no signs of infection. There is no peripheral edema.  Diagnostic Tests:  CLINICAL DATA:  Post CABG x4.  EXAM: CHEST - 2 VIEW  COMPARISON:  10/25/2019, 11/25/2019, chest CT  02/22/2019  FINDINGS: Post median sternotomy. Loop recorder over the left heart unchanged. Lungs are adequately inflated without focal airspace consolidation or effusion. Stable subcentimeter nodular density over the right midlung as seen on previous chest CT. Cardiomediastinal silhouette and remainder of the exam is unchanged.  IMPRESSION: 1.  No active cardiopulmonary disease.  2. Stable subcentimeter nodule over the right midlung. Patient is due for follow-up CT January 2022.   Electronically Signed   By: Marin Olp M.D.   On: 12/22/2019 11:47  Impression:  Overall I think she is making great progress following coronary bypass graft surgery followed by embolic strokes after going home.  She has regained a lot of her function and is walking with a cane.  She is anxious to return to driving a car and I think that is fine as long as she feels up to it.  Her husband feels she is ready to do that.  The etiology of her embolic strokes is not completely clear.  She had no intracardiac thrombus on echocardiogram.  It is certainly possible that she could have had emboli from her ascending aorta or aortic arch.  I encouraged her to continue ambulating as much as possible.  She is still receiving physical therapy at home and is trying to extend that.  I asked her not to lift anything heavier than 10 pounds for 3 months postoperatively.  Plan:  She will continue to follow-up with Dr. Geraldo Pitter and will return to see me if she has any problems with her incisions.   Gaye Pollack, MD [facility] [phone]

## 2019-12-30 ENCOUNTER — Inpatient Hospital Stay: Payer: Self-pay | Admitting: Adult Health

## 2019-12-31 DIAGNOSIS — M545 Low back pain: Secondary | ICD-10-CM | POA: Diagnosis not present

## 2019-12-31 DIAGNOSIS — I700 Atherosclerosis of aorta: Secondary | ICD-10-CM | POA: Diagnosis not present

## 2019-12-31 DIAGNOSIS — M7989 Other specified soft tissue disorders: Secondary | ICD-10-CM | POA: Diagnosis not present

## 2019-12-31 DIAGNOSIS — K219 Gastro-esophageal reflux disease without esophagitis: Secondary | ICD-10-CM | POA: Diagnosis not present

## 2019-12-31 DIAGNOSIS — Z6829 Body mass index (BMI) 29.0-29.9, adult: Secondary | ICD-10-CM | POA: Diagnosis not present

## 2019-12-31 DIAGNOSIS — E669 Obesity, unspecified: Secondary | ICD-10-CM | POA: Diagnosis not present

## 2019-12-31 DIAGNOSIS — E663 Overweight: Secondary | ICD-10-CM | POA: Diagnosis not present

## 2019-12-31 DIAGNOSIS — Z8673 Personal history of transient ischemic attack (TIA), and cerebral infarction without residual deficits: Secondary | ICD-10-CM | POA: Diagnosis not present

## 2020-01-03 ENCOUNTER — Ambulatory Visit (INDEPENDENT_AMBULATORY_CARE_PROVIDER_SITE_OTHER): Payer: PPO | Admitting: *Deleted

## 2020-01-03 DIAGNOSIS — I6349 Cerebral infarction due to embolism of other cerebral artery: Secondary | ICD-10-CM | POA: Diagnosis not present

## 2020-01-03 LAB — CUP PACEART REMOTE DEVICE CHECK
Date Time Interrogation Session: 20210516010522
Implantable Pulse Generator Implant Date: 20210413

## 2020-01-04 NOTE — Progress Notes (Signed)
Carelink Summary Report / Loop Recorder 

## 2020-01-13 DIAGNOSIS — Z95818 Presence of other cardiac implants and grafts: Secondary | ICD-10-CM | POA: Diagnosis not present

## 2020-01-13 DIAGNOSIS — M1990 Unspecified osteoarthritis, unspecified site: Secondary | ICD-10-CM | POA: Diagnosis not present

## 2020-01-13 DIAGNOSIS — I509 Heart failure, unspecified: Secondary | ICD-10-CM | POA: Diagnosis not present

## 2020-01-13 DIAGNOSIS — G4733 Obstructive sleep apnea (adult) (pediatric): Secondary | ICD-10-CM | POA: Diagnosis not present

## 2020-01-13 DIAGNOSIS — I2510 Atherosclerotic heart disease of native coronary artery without angina pectoris: Secondary | ICD-10-CM | POA: Diagnosis not present

## 2020-01-13 DIAGNOSIS — D696 Thrombocytopenia, unspecified: Secondary | ICD-10-CM | POA: Diagnosis not present

## 2020-01-13 DIAGNOSIS — I69398 Other sequelae of cerebral infarction: Secondary | ICD-10-CM | POA: Diagnosis not present

## 2020-01-13 DIAGNOSIS — H5462 Unqualified visual loss, left eye, normal vision right eye: Secondary | ICD-10-CM | POA: Diagnosis not present

## 2020-01-13 DIAGNOSIS — I110 Hypertensive heart disease with heart failure: Secondary | ICD-10-CM | POA: Diagnosis not present

## 2020-01-13 DIAGNOSIS — I69354 Hemiplegia and hemiparesis following cerebral infarction affecting left non-dominant side: Secondary | ICD-10-CM | POA: Diagnosis not present

## 2020-01-13 DIAGNOSIS — R1310 Dysphagia, unspecified: Secondary | ICD-10-CM | POA: Diagnosis not present

## 2020-01-13 DIAGNOSIS — Z951 Presence of aortocoronary bypass graft: Secondary | ICD-10-CM | POA: Diagnosis not present

## 2020-01-13 DIAGNOSIS — Z87891 Personal history of nicotine dependence: Secondary | ICD-10-CM | POA: Diagnosis not present

## 2020-01-13 DIAGNOSIS — K219 Gastro-esophageal reflux disease without esophagitis: Secondary | ICD-10-CM | POA: Diagnosis not present

## 2020-01-13 DIAGNOSIS — J449 Chronic obstructive pulmonary disease, unspecified: Secondary | ICD-10-CM | POA: Diagnosis not present

## 2020-01-13 DIAGNOSIS — F419 Anxiety disorder, unspecified: Secondary | ICD-10-CM | POA: Diagnosis not present

## 2020-01-18 ENCOUNTER — Other Ambulatory Visit: Payer: Self-pay | Admitting: Primary Care

## 2020-01-18 DIAGNOSIS — R918 Other nonspecific abnormal finding of lung field: Secondary | ICD-10-CM

## 2020-01-18 DIAGNOSIS — R911 Solitary pulmonary nodule: Secondary | ICD-10-CM

## 2020-01-21 ENCOUNTER — Telehealth: Payer: Self-pay

## 2020-01-21 ENCOUNTER — Other Ambulatory Visit: Payer: Self-pay | Admitting: Surgical

## 2020-01-21 DIAGNOSIS — I10 Essential (primary) hypertension: Secondary | ICD-10-CM | POA: Diagnosis not present

## 2020-01-21 DIAGNOSIS — E782 Mixed hyperlipidemia: Secondary | ICD-10-CM | POA: Diagnosis not present

## 2020-01-21 DIAGNOSIS — Z79899 Other long term (current) drug therapy: Secondary | ICD-10-CM | POA: Diagnosis not present

## 2020-01-21 DIAGNOSIS — I7090 Unspecified atherosclerosis: Secondary | ICD-10-CM

## 2020-01-21 DIAGNOSIS — I708 Atherosclerosis of other arteries: Secondary | ICD-10-CM

## 2020-01-21 NOTE — Telephone Encounter (Signed)
Telephone note started to place lab orders for the patient.

## 2020-01-22 LAB — LIPID PANEL
Chol/HDL Ratio: 2.5 ratio (ref 0.0–4.4)
Cholesterol, Total: 91 mg/dL — ABNORMAL LOW (ref 100–199)
HDL: 37 mg/dL — ABNORMAL LOW (ref >39)
LDL Chol Calc (NIH): 27 mg/dL (ref 0–99)
Triglycerides: 163 mg/dL — ABNORMAL HIGH (ref 0–149)
VLDL Cholesterol Cal: 27 mg/dL (ref 5–40)

## 2020-01-22 LAB — BASIC METABOLIC PANEL
BUN/Creatinine Ratio: 12 (ref 12–28)
BUN: 13 mg/dL (ref 8–27)
CO2: 23 mmol/L (ref 20–29)
Calcium: 8.9 mg/dL (ref 8.7–10.3)
Chloride: 103 mmol/L (ref 96–106)
Creatinine, Ser: 1.09 mg/dL — ABNORMAL HIGH (ref 0.57–1.00)
GFR calc Af Amer: 56 mL/min/{1.73_m2} — ABNORMAL LOW (ref >59)
GFR calc non Af Amer: 48 mL/min/{1.73_m2} — ABNORMAL LOW (ref >59)
Glucose: 93 mg/dL (ref 65–99)
Potassium: 4.1 mmol/L (ref 3.5–5.2)
Sodium: 142 mmol/L (ref 134–144)

## 2020-01-22 LAB — CBC
Hematocrit: 34.3 % (ref 34.0–46.6)
Hemoglobin: 11.6 g/dL (ref 11.1–15.9)
MCH: 34.4 pg — ABNORMAL HIGH (ref 26.6–33.0)
MCHC: 33.8 g/dL (ref 31.5–35.7)
MCV: 102 fL — ABNORMAL HIGH (ref 79–97)
Platelets: 136 10*3/uL — ABNORMAL LOW (ref 150–450)
RBC: 3.37 x10E6/uL — ABNORMAL LOW (ref 3.77–5.28)
RDW: 17.4 % — ABNORMAL HIGH (ref 11.7–15.4)
WBC: 15.4 10*3/uL — ABNORMAL HIGH (ref 3.4–10.8)

## 2020-01-22 LAB — HEPATIC FUNCTION PANEL
ALT: 14 IU/L (ref 0–32)
AST: 24 IU/L (ref 0–40)
Albumin: 4.5 g/dL (ref 3.7–4.7)
Alkaline Phosphatase: 84 IU/L (ref 48–121)
Bilirubin Total: 0.6 mg/dL (ref 0.0–1.2)
Bilirubin, Direct: 0.17 mg/dL (ref 0.00–0.40)
Total Protein: 7.5 g/dL (ref 6.0–8.5)

## 2020-01-22 LAB — TSH: TSH: 3.65 u[IU]/mL (ref 0.450–4.500)

## 2020-01-25 ENCOUNTER — Telehealth: Payer: Self-pay | Admitting: Pulmonary Disease

## 2020-01-25 NOTE — Telephone Encounter (Signed)
Called and spoke with pt and got her scheduled for a televisit with Dr. Valeta Harms 7/14 after CT. Nothing further needed.

## 2020-01-25 NOTE — Telephone Encounter (Signed)
I spoke to pt & gave her CT appt info for [facility] on 7/6.  She is on recall list for BI in July.  I asked if she would like to schedule her ov and she states not able to drive in [place] now and would not have anyone who could bring her - she felt she would have to let her PCP in [place] take care of her.  I explained televisit and this may be option for her.  Told her I would send message to triage and see if she can be set up for televist with BI after her CT.

## 2020-02-02 ENCOUNTER — Encounter: Payer: Self-pay | Admitting: Pulmonary Disease

## 2020-02-07 ENCOUNTER — Ambulatory Visit (INDEPENDENT_AMBULATORY_CARE_PROVIDER_SITE_OTHER): Payer: PPO | Admitting: *Deleted

## 2020-02-07 DIAGNOSIS — I6349 Cerebral infarction due to embolism of other cerebral artery: Secondary | ICD-10-CM

## 2020-02-07 LAB — CUP PACEART REMOTE DEVICE CHECK
Date Time Interrogation Session: 20210620230509
Implantable Pulse Generator Implant Date: 20210413

## 2020-02-08 NOTE — Progress Notes (Signed)
Carelink Summary Report / Loop Recorder 

## 2020-02-09 ENCOUNTER — Other Ambulatory Visit: Payer: Self-pay | Admitting: Physical Medicine and Rehabilitation

## 2020-02-22 DIAGNOSIS — K449 Diaphragmatic hernia without obstruction or gangrene: Secondary | ICD-10-CM | POA: Diagnosis not present

## 2020-02-22 DIAGNOSIS — Z955 Presence of coronary angioplasty implant and graft: Secondary | ICD-10-CM | POA: Diagnosis not present

## 2020-02-22 DIAGNOSIS — R911 Solitary pulmonary nodule: Secondary | ICD-10-CM | POA: Diagnosis not present

## 2020-02-22 DIAGNOSIS — I517 Cardiomegaly: Secondary | ICD-10-CM | POA: Diagnosis not present

## 2020-02-23 ENCOUNTER — Telehealth: Payer: Self-pay | Admitting: Cardiology

## 2020-02-23 NOTE — Telephone Encounter (Signed)
Always pcp

## 2020-02-23 NOTE — Telephone Encounter (Signed)
Pt c/o medication issue:  1. Name of Medication:   ferrous [id number] C-folic acid (TRINSICON / FOLTRIN) capsule   2. How are you currently taking this medication (dosage and times per day)? As written  3. Are you having a reaction (difficulty breathing--STAT)? No  4. What is your medication issue? Pts needs a new prescription

## 2020-02-23 NOTE — Telephone Encounter (Signed)
Spoke with the patient and let her know this medication will have to be refilled by her PCP. She verbalizes understanding and thanks me for the call back

## 2020-02-23 NOTE — Telephone Encounter (Signed)
Detailed vm left for pt to have her medication refilled by her PCP.

## 2020-02-24 DIAGNOSIS — I2510 Atherosclerotic heart disease of native coronary artery without angina pectoris: Secondary | ICD-10-CM | POA: Diagnosis not present

## 2020-02-24 DIAGNOSIS — D472 Monoclonal gammopathy: Secondary | ICD-10-CM | POA: Diagnosis not present

## 2020-02-24 DIAGNOSIS — D72829 Elevated white blood cell count, unspecified: Secondary | ICD-10-CM | POA: Diagnosis not present

## 2020-02-24 DIAGNOSIS — R0609 Other forms of dyspnea: Secondary | ICD-10-CM | POA: Diagnosis not present

## 2020-02-24 DIAGNOSIS — Z951 Presence of aortocoronary bypass graft: Secondary | ICD-10-CM | POA: Diagnosis not present

## 2020-02-24 DIAGNOSIS — D539 Nutritional anemia, unspecified: Secondary | ICD-10-CM | POA: Diagnosis not present

## 2020-03-01 ENCOUNTER — Ambulatory Visit (INDEPENDENT_AMBULATORY_CARE_PROVIDER_SITE_OTHER): Payer: PPO | Admitting: Primary Care

## 2020-03-01 ENCOUNTER — Ambulatory Visit: Payer: PPO | Admitting: Pulmonary Disease

## 2020-03-01 ENCOUNTER — Telehealth: Payer: Self-pay | Admitting: Primary Care

## 2020-03-01 ENCOUNTER — Other Ambulatory Visit: Payer: Self-pay

## 2020-03-01 DIAGNOSIS — D7389 Other diseases of spleen: Secondary | ICD-10-CM

## 2020-03-01 DIAGNOSIS — R918 Other nonspecific abnormal finding of lung field: Secondary | ICD-10-CM | POA: Diagnosis not present

## 2020-03-01 NOTE — Telephone Encounter (Signed)
[facility] and spoke to [place] in Radiology requesting that the results for the patients CT be faxed to [place].  She requested [place] to fax that request to her.  Request faxed.  Will await results.

## 2020-03-01 NOTE — Progress Notes (Signed)
Virtual Visit via Telephone Note  I connected with Yolanda Flowers on 03/01/20 at  2:30 PM EDT by telephone and verified that I am speaking with the correct person using two identifiers.  Location: Patient: Home Provider: Office   I discussed the limitations, risks, security and privacy concerns of performing an evaluation and management service by telephone and the availability of in person appointments. I also discussed with the patient that there may be a patient responsible charge related to this service. The patient expressed understanding and agreed to proceed.   History of Present Illness: 79 year old female, never smoked.  Past medical history significant for COPD, allergic rhinitis, coronary artery disease, hypertension, embolic stroke, GERD.  Patient of Dr. Valeta Harms, last seen on 06/16/2019.  Shortness of breath and dyspnea on exertion felt to be predominantly related to physical activity.  She had a negative methacholine challenge and pulmonary function testing showed no obstruction.  Her symptoms have been unchanged after being on inhalers and it was recommended that she stop taking these.  She is scheduled for repeat CT scan in July 2021 to follow-up lung nodule (this was done at an outside [facility])  Previous [facility] pulmonary encounter: 06/16/19- Dr. Valeta Harms OV 06/16/2019: Patient last by [place] seen by me in March 2020.  History of asthma type symptoms was started on Symbicort.  Patient had echocardiogram with mildly impaired diastolic dysfunction, RVSP 28.  No evidence of obstruction on PFTs.  Started trial of Singulair nightly.  Patient had minimal improvement with low-dose ICS/LABA, this was increased to Symbicort 162 puffs, twice daily.  Decision was made to order methacholine challenge July 2020 as she has normal PFTs patient's MCT was normal and the FEV1 did not decline.  Patient has not been ill in any of her inhalers or using any of the maintenance medications.  Her  symptoms have not changed.  Further discussion about her symptomatology she feels as if it is related to her physical activity.  She feels that she is just getting tired more easily.  Even though she thinks that she wants to be able to go and do more.  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  She is reassured by the fact that most of the testing that has been completed has not revealed anything significant or severe.  03/01/2020 Patient contacted today for virtual telephone visit to review CT chest.  She is doing well, breathing is better. No chest tightness or pain. She has CABG in March. 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 She completed physical therapy at home. She takes tramadol 50mg  daily for back pain. She follows with Altha Harm McCarty/oncology in [place], recently seen. Denies fever, chills, sweats, weight loss, N/V/D, abdominal pain.   Observations/Objective:  - Appears well; no overt shortness of breath, wheezing or cough during phone conversation  Imaging:  02/17/20 CT Chest- 58mm pulmonary nodule RUL and 72mm nodules right middle lobe are stable. No new or enlarging pulmonary nodules. Cardiomegaly. Prior CABG. Small hiatal hernia. Low density area inferiorly within the spleen, new since prior study. These could be further characterized by adb CT or MIR if felt clinically indicated.  Assessment and Plan:  Pulmonary nodules: - Stable 80mm RUL and 56mm RML pulmonary nodules, no new or enlarging nodules. No additional follow up needed for this, patient  is low risk.   Nodule of spleen:  - Incidental new spleen hypodensity found on CT chest, unclear characterization. Advised patient follow-up with PCP to determine if she need additional imaging for this/ she would like for [place] to forward image results to her oncologist Dr. Hinton Rao in [place]    Follow Up  Instructions:   - Follow-up as needed basis with pulmonary   I discussed the assessment and treatment plan with the patient. The patient was provided an opportunity to ask questions and all were answered. The patient agreed with the plan and demonstrated an understanding of the instructions.   The patient was advised to call back or seek an in-person evaluation if the symptoms worsen or if the condition fails to improve as anticipated.  I provided 18 minutes of non-face-to-face time during this encounter.   Martyn Ehrich, NP

## 2020-03-02 NOTE — Progress Notes (Signed)
Thanks for seeing them Garner Nash, DO [facility] 03/02/2020 11:01 AM

## 2020-03-09 DIAGNOSIS — R921 Mammographic calcification found on diagnostic imaging of breast: Secondary | ICD-10-CM | POA: Diagnosis not present

## 2020-03-09 DIAGNOSIS — N649 Disorder of breast, unspecified: Secondary | ICD-10-CM | POA: Diagnosis not present

## 2020-03-12 LAB — CUP PACEART REMOTE DEVICE CHECK
Date Time Interrogation Session: 20210725230543
Implantable Pulse Generator Implant Date: 20210413

## 2020-03-13 ENCOUNTER — Ambulatory Visit (INDEPENDENT_AMBULATORY_CARE_PROVIDER_SITE_OTHER): Payer: PPO | Admitting: *Deleted

## 2020-03-13 DIAGNOSIS — I6349 Cerebral infarction due to embolism of other cerebral artery: Secondary | ICD-10-CM | POA: Diagnosis not present

## 2020-03-16 NOTE — Progress Notes (Signed)
Carelink Summary Report / Loop Recorder 

## 2020-03-22 ENCOUNTER — Other Ambulatory Visit: Payer: Self-pay

## 2020-03-22 ENCOUNTER — Ambulatory Visit: Payer: PPO | Admitting: Cardiology

## 2020-03-22 ENCOUNTER — Encounter: Payer: Self-pay | Admitting: Cardiology

## 2020-03-22 VITALS — BP 130/80 | HR 62 | Ht 65.0 in | Wt 158.0 lb

## 2020-03-22 DIAGNOSIS — I2510 Atherosclerotic heart disease of native coronary artery without angina pectoris: Secondary | ICD-10-CM | POA: Diagnosis not present

## 2020-03-22 DIAGNOSIS — N289 Disorder of kidney and ureter, unspecified: Secondary | ICD-10-CM

## 2020-03-22 DIAGNOSIS — Z951 Presence of aortocoronary bypass graft: Secondary | ICD-10-CM

## 2020-03-22 DIAGNOSIS — I10 Essential (primary) hypertension: Secondary | ICD-10-CM | POA: Diagnosis not present

## 2020-03-22 DIAGNOSIS — J431 Panlobular emphysema: Secondary | ICD-10-CM

## 2020-03-22 NOTE — Patient Instructions (Signed)
Medication Instructions:  No medication changes. *If you need a refill on your cardiac medications before your next appointment, please call your pharmacy*  Please call the office at [phone] with current dose of Atorvastatin.   Lab Work: Your physician recommends that you return for lab work in: I month (04/22/20)  You need to have labs done when you are fasting.  You can come Monday through Friday 8:30 am to 12:00 pm and 1:15 to 4:30. You do not need to make an appointment as the order has already been placed. The labs you are going to have done are BMET, CBC, TSH, LFT and Lipids.   If you have labs (blood work) drawn today and your tests are completely normal, you will receive your results only by: Marland Kitchen MyChart Message (if you have MyChart) OR . A paper copy in the mail If you have any lab test that is abnormal or we need to change your treatment, we will call you to review the results.   Testing/Procedures: None ordered   Follow-Up: At [facility], you and your health needs are our priority.  As part of our continuing mission to provide you with exceptional heart care, we have created designated Provider Care Teams.  These Care Teams include your primary Cardiologist (physician) and Advanced Practice Providers (APPs -  Physician Assistants and Nurse Practitioners) who all work together to provide you with the care you need, when you need it.  We recommend signing up for the patient portal called "MyChart".  Sign up information is provided on this After Visit Summary.  MyChart is used to connect with patients for Virtual Visits (Telemedicine).  Patients are able to view lab/test results, encounter notes, upcoming appointments, etc.  Non-urgent messages can be sent to your provider as well.   To learn more about what you can do with MyChart, go to [email or web address].    Your next appointment:   6 month(s)  The format for your next appointment:   In Person  Provider:    Jyl Heinz, MD   Other Instructions  Dr. Geraldo Pitter said that after reviewing your echocardiogram you do not need an antibiotic for your dental cleaning.

## 2020-03-22 NOTE — Progress Notes (Signed)
Cardiology Office Note:    Date:  03/22/2020   ID:  Yolanda Flowers, DOB 21-Apr-1941, MRN [id number]  PCP:  [name], MD  Cardiologist:  Jenean Lindau, MD   Referring MD: [name], MD    ASSESSMENT:    1. Coronary artery disease involving native coronary artery of native heart without angina pectoris   2. S/P CABG x 4   3. Renal insufficiency   4. Panlobular emphysema ([place])    PLAN:    In order of problems listed above:  1. Coronary artery disease: Secondary prevention stressed with the patient.  Importance of compliance with diet medication stressed and she vocalized understanding.  Importance of regular activity stressed and she promises to do better. 2. Mixed dyslipidemia: Diet was emphasized.  She is not sure about the rules of atorvastatin and she will go home and let us know about the exact dose.  Her LDL is fairly low.  She will be back in 1 month for complete blood work fasting including lipids 3. COPD: Stable managed by primary care and pulmonologist.  I reviewed recent pulmonary evaluation. 4. Patient will be seen in follow-up appointment in 6 months or earlier if the patient has any concerns    Medication Adjustments/Labs and Tests Ordered: Current medicines are reviewed at length with the patient today.  Concerns regarding medicines are outlined above.  No orders of the defined types were placed in this encounter.  No orders of the defined types were placed in this encounter.    No chief complaint on file.    History of Present Illness:    BERTHE Flowers is a 79 y.o. female.  Patient has past medical history of COPD, coronary artery disease post CABG surgery renal insufficiency.  She denies any problems at this time and takes care of activities of daily living.  No chest pain orthopnea or PND.  She is undergone bypass surgery and has recovered well.  She has recovered well from the stroke also.  Her loop recorder has so far not yielded any arrhythmias and  I reviewed those records.  Past Medical History:  Diagnosis Date  . Abdominal pain   . Accelerating angina ([place]) 08/26/2019  . Allergic rhinitis 06/21/2014  . Anxiety   . Arthritis   . Asthma   . Atherosclerosis of arteries 08/24/2019  . Bilateral edema of lower extremity   . Body mass index 29.0-29.9, adult 08/24/2019  . CAD (coronary artery disease) 10/25/2019  . Cellulitis of right leg   . Combined hyperlipidemia 08/24/2019  . Complication of anesthesia    difficulty waking  . Congestive heart failure (CHF) ([place])   . COPD (chronic obstructive pulmonary disease) ([place])   . Coronary artery disease   . Dyspnea 02/09/2019  . Dyspnea on exertion   . Embolic stroke ([place]) 0/93/8182  . Essential thrombocytosis ([place]) 06/22/2014  . Gastrointestinal bleeding 06/23/2018  . GERD (gastroesophageal reflux disease)   . History of melanoma excision    left greast toe 2015  . History of squamous cell carcinoma excision    face--  multiple excisions  . History of subdural hematoma    2008  . HTN (hypertension) 06/21/2014  . Hypertension   . Ischial bursitis, right 08/24/2019  . Malignant melanoma of great toe ([place]) 08/24/2019  . Melanoma in situ ([place]) 06/29/2014  . OSA (obstructive sleep apnea)    intolerant  . Pedal edema 08/24/2019  . Renal insufficiency 08/24/2019  . S/P CABG x 4 11/04/2019  .  Skin cancer 08/24/2019  . Solitary pulmonary nodule on lung CT 02/09/2019  . Squamous cell carcinoma, scalp/neck 08/24/2019  . Stroke due to embolism ([place]) 11/25/2019  . Swelling of limb 08/24/2019  . Thrombocytosis ([place])    takes hydoxyurea--  MONITORED BY DR Hinton Rao [facility])  . VIN III (vulvar intraepithelial neoplasia III)   . Vitamin B 12 deficiency 08/24/2019  . Vitamin D deficiency 08/24/2019  . Wears dentures    UPPER AND LOWER PARTIAL  . Wears glasses     Past Surgical History:  Procedure Laterality Date  . ABDOMINAL HYSTERECTOMY  age 29  . CARDIAC CATHETERIZATION  03/172021  . CORONARY  ARTERY BYPASS GRAFT N/A 11/04/2019   Procedure: CORONARY ARTERY BYPASS GRAFTING (CABG) x 4, with ENDOSCOPIC HARVESTING OF RIGHT GREATER SAPHENOUS VEIN.;  Surgeon: Gaye Pollack, MD;  Location: [place] OR;  Service: Open Heart Surgery;  Laterality: N/A;  . INTRAVASCULAR ULTRASOUND/IVUS N/A 11/03/2019   Procedure: Intravascular Ultrasound/IVUS;  Surgeon: Nelva Bush, MD;  Location: [place] CV LAB;  Service: Cardiovascular;  Laterality: N/A;  . KNEE ARTHROSCOPY Left 2004  . LEFT HEART CATH AND CORONARY ANGIOGRAPHY N/A 11/03/2019   Procedure: LEFT HEART CATH AND CORONARY ANGIOGRAPHY;  Surgeon: Nelva Bush, MD;  Location: [place] [facility];  Service: Cardiovascular;  Laterality: N/A;  . LOOP RECORDER INSERTION N/A 11/30/2019   Procedure: LOOP RECORDER INSERTION;  Surgeon: Thompson Grayer, MD;  Location: [place] [facility];  Service: Cardiovascular;  Laterality: N/A;  . MELANOMA EXCISION  2015   left great toe  . REPAIR PERONEAL TENDONS ANKLE  2004  . SUBDURAL HEMATOMA EVACUATION VIA CRANIOTOMY  2008      week later  post-op  [facility]  . TEE WITHOUT CARDIOVERSION N/A 11/04/2019   Procedure: TRANSESOPHAGEAL ECHOCARDIOGRAM (TEE);  Surgeon: Gaye Pollack, MD;  Location: [place];  Service: Open Heart Surgery;  Laterality: N/A;  . VULVECTOMY N/A 10/24/2015   Procedure: WIDE LOCAL EXCISION OF THE VULVA ;  Surgeon: Everitt Amber, MD;  Location: [place];  Service: Gynecology;  Laterality: N/A;    Current Medications: Current Meds  Medication Sig  . acetaminophen (TYLENOL) 325 MG tablet Take 1-2 tablets (325-650 mg total) by mouth every 4 (four) hours as needed for mild pain.  Marland Kitchen ammonium lactate (LAC-HYDRIN) 12 % lotion Apply 1 application topically daily as needed (skin spots).   . Ascorbic Acid (VITAMIN C) 1000 MG tablet Take 1,000 mg by mouth daily.  Marland Kitchen atorvastatin (LIPITOR) 80 MG tablet Take 1 tablet (80 mg total) by mouth daily at 6 PM.  . Calcium Carb-Cholecalciferol  (CALCIUM 600 + D PO) Take 1 tablet by mouth in the morning and at bedtime.   . clopidogrel (PLAVIX) 75 MG tablet Take 1 tablet (75 mg total) by mouth daily.  . fluticasone (FLONASE) 50 MCG/ACT nasal spray Place 2 sprays into both nostrils at bedtime.   . hydroxyurea (HYDREA) 500 MG capsule Take 500 mg by mouth daily.   Marland Kitchen lidocaine (LIDODERM) 5 % Place 1 patch onto the skin daily. Remove & Discard patch within 12 hours or as directed by MD  . Magnesium 250 MG TABS Take 250 mg by mouth at bedtime.   . metoprolol tartrate (LOPRESSOR) 25 MG tablet Take 1 tablet (25 mg total) by mouth 2 (two) times daily.  . Multiple Vitamins-Minerals (PRESERVISION AREDS 2) CAPS Take 1 capsule by mouth in the morning and at bedtime.   . pantoprazole (PROTONIX) 40 MG  tablet Take 1 tablet (40 mg total) by mouth daily.  . traMADol (ULTRAM) 50 MG tablet Take 1 tablet (50 mg total) by mouth every 6 (six) hours as needed for moderate pain.  . Vitamin D, Ergocalciferol, (DRISDOL) 1.25 MG (50000 UT) CAPS capsule Take 50,000 Units by mouth every 14 (fourteen) days.     Allergies:   Cephalexin, Ciprofloxacin, and Penicillins   Social History   Socioeconomic History  . Marital status: Married    Spouse name: Not on file  . Number of children: Not on file  . Years of education: Not on file  . Highest education level: Not on file  Occupational History  . Not on file  Tobacco Use  . Smoking status: Never Smoker  . Smokeless tobacco: Never Used  Vaping Use  . Vaping Use: Never used  Substance and Sexual Activity  . Alcohol use: No  . Drug use: No  . Sexual activity: Not Currently  Other Topics Concern  . Not on file  Social History Narrative  . Not on file   Social Determinants of Health   Financial Resource Strain:   . Difficulty of Paying Living Expenses:   Food Insecurity:   . Worried About Charity fundraiser in the Last Year:   . Arboriculturist in the Last Year:   Transportation Needs:   . Lexicographer (Medical):   Marland Kitchen Lack of Transportation (Non-Medical):   Physical Activity:   . Days of Exercise per Week:   . Minutes of Exercise per Session:   Stress:   . Feeling of Stress :   Social Connections:   . Frequency of Communication with Friends and Family:   . Frequency of Social Gatherings with Friends and Family:   . Attends Religious Services:   . Active Member of Clubs or Organizations:   . Attends Archivist Meetings:   Marland Kitchen Marital Status:      Family History: The patient's family history includes Arthritis in her brother, father, mother, and sister; Asthma in her father; Clotting disorder in her mother; Emphysema in her father; Heart disease in her brother, father, mother, and sister; Hypertension in her brother, father, mother, and sister; Kidney failure in her father; Stroke in her father.  ROS:   Please see the history of present illness.    All other systems reviewed and are negative.  EKGs/Labs/Other Studies Reviewed:    The following studies were reviewed today: I reviewed records including blood work   Recent Labs: 11/05/2019: Magnesium 2.4 01/21/2020: ALT 14; BUN 13; Creatinine, Ser 1.09; Hemoglobin 11.6; Platelets 136; Potassium 4.1; Sodium 142; TSH 3.650  Recent Lipid Panel    Component Value Date/Time   CHOL 91 (L) 01/21/2020 0821   TRIG 163 (H) 01/21/2020 0821   HDL 37 (L) 01/21/2020 0821   CHOLHDL 2.5 01/21/2020 0821   CHOLHDL 2.8 11/25/2019 2139   VLDL 21 11/25/2019 2139   LDLCALC 27 01/21/2020 0821    Physical Exam:    VS:  BP 130/80   Pulse 62   Ht 5\' 5"  (1.651 m)   Wt 158 lb (71.7 kg)   SpO2 95%   BMI 26.29 kg/m     Wt Readings from Last 3 Encounters:  03/22/20 158 lb (71.7 kg)  12/22/19 159 lb (72.1 kg)  12/21/19 160 lb (72.6 kg)     GEN: Patient is in no acute distress HEENT: Normal NECK: No JVD; No carotid bruits LYMPHATICS: No lymphadenopathy CARDIAC:  Hear sounds regular, 2/6 systolic murmur at the  apex. RESPIRATORY:  Clear to auscultation without rales, wheezing or rhonchi  ABDOMEN: Soft, non-tender, non-distended MUSCULOSKELETAL:  No edema; No deformity  SKIN: Warm and dry NEUROLOGIC:  Alert and oriented x 3 PSYCHIATRIC:  Normal affect   Signed, Jenean Lindau, MD  03/22/2020 1:22 PM    [facility]

## 2020-04-12 DIAGNOSIS — E785 Hyperlipidemia, unspecified: Secondary | ICD-10-CM | POA: Diagnosis not present

## 2020-04-12 DIAGNOSIS — K219 Gastro-esophageal reflux disease without esophagitis: Secondary | ICD-10-CM | POA: Diagnosis not present

## 2020-04-12 DIAGNOSIS — Z780 Asymptomatic menopausal state: Secondary | ICD-10-CM | POA: Diagnosis not present

## 2020-04-12 DIAGNOSIS — D473 Essential (hemorrhagic) thrombocythemia: Secondary | ICD-10-CM | POA: Diagnosis not present

## 2020-04-12 DIAGNOSIS — I708 Atherosclerosis of other arteries: Secondary | ICD-10-CM | POA: Diagnosis not present

## 2020-04-12 DIAGNOSIS — E559 Vitamin D deficiency, unspecified: Secondary | ICD-10-CM | POA: Diagnosis not present

## 2020-04-12 DIAGNOSIS — C4490 Unspecified malignant neoplasm of skin, unspecified: Secondary | ICD-10-CM | POA: Diagnosis not present

## 2020-04-12 DIAGNOSIS — Z9181 History of falling: Secondary | ICD-10-CM | POA: Diagnosis not present

## 2020-04-12 DIAGNOSIS — E538 Deficiency of other specified B group vitamins: Secondary | ICD-10-CM | POA: Diagnosis not present

## 2020-04-12 DIAGNOSIS — D649 Anemia, unspecified: Secondary | ICD-10-CM | POA: Diagnosis not present

## 2020-04-12 DIAGNOSIS — Z79899 Other long term (current) drug therapy: Secondary | ICD-10-CM | POA: Diagnosis not present

## 2020-04-12 DIAGNOSIS — M7989 Other specified soft tissue disorders: Secondary | ICD-10-CM | POA: Diagnosis not present

## 2020-04-12 DIAGNOSIS — I10 Essential (primary) hypertension: Secondary | ICD-10-CM | POA: Diagnosis not present

## 2020-04-12 DIAGNOSIS — E663 Overweight: Secondary | ICD-10-CM | POA: Diagnosis not present

## 2020-04-12 DIAGNOSIS — Z1331 Encounter for screening for depression: Secondary | ICD-10-CM | POA: Diagnosis not present

## 2020-04-17 ENCOUNTER — Ambulatory Visit (INDEPENDENT_AMBULATORY_CARE_PROVIDER_SITE_OTHER): Payer: PPO | Admitting: *Deleted

## 2020-04-17 DIAGNOSIS — I6349 Cerebral infarction due to embolism of other cerebral artery: Secondary | ICD-10-CM | POA: Diagnosis not present

## 2020-04-17 LAB — CUP PACEART REMOTE DEVICE CHECK
Date Time Interrogation Session: 20210827230541
Implantable Pulse Generator Implant Date: 20210413

## 2020-04-19 NOTE — Progress Notes (Signed)
Carelink Summary Report / Loop Recorder 

## 2020-04-21 DIAGNOSIS — I10 Essential (primary) hypertension: Secondary | ICD-10-CM | POA: Diagnosis not present

## 2020-04-21 DIAGNOSIS — I2510 Atherosclerotic heart disease of native coronary artery without angina pectoris: Secondary | ICD-10-CM | POA: Diagnosis not present

## 2020-04-21 DIAGNOSIS — N289 Disorder of kidney and ureter, unspecified: Secondary | ICD-10-CM | POA: Diagnosis not present

## 2020-04-21 DIAGNOSIS — J431 Panlobular emphysema: Secondary | ICD-10-CM | POA: Diagnosis not present

## 2020-04-22 LAB — CBC WITH DIFFERENTIAL/PLATELET
Basophils Absolute: 0.3 10*3/uL — ABNORMAL HIGH (ref 0.0–0.2)
Basos: 2 %
EOS (ABSOLUTE): 0.2 10*3/uL (ref 0.0–0.4)
Eos: 1 %
Hematocrit: 34.9 % (ref 34.0–46.6)
Hemoglobin: 11.6 g/dL (ref 11.1–15.9)
Immature Grans (Abs): 0.7 10*3/uL — ABNORMAL HIGH (ref 0.0–0.1)
Immature Granulocytes: 4 %
Lymphocytes Absolute: 2.1 10*3/uL (ref 0.7–3.1)
Lymphs: 13 %
MCH: 35.3 pg — ABNORMAL HIGH (ref 26.6–33.0)
MCHC: 33.2 g/dL (ref 31.5–35.7)
MCV: 106 fL — ABNORMAL HIGH (ref 79–97)
Monocytes Absolute: 1.6 10*3/uL — ABNORMAL HIGH (ref 0.1–0.9)
Monocytes: 10 %
NRBC: 1 % — ABNORMAL HIGH (ref 0–0)
Neutrophils Absolute: 11.4 10*3/uL — ABNORMAL HIGH (ref 1.4–7.0)
Neutrophils: 70 %
Platelets: 149 10*3/uL — ABNORMAL LOW (ref 150–450)
RBC: 3.29 x10E6/uL — ABNORMAL LOW (ref 3.77–5.28)
RDW: 15 % (ref 11.7–15.4)
WBC: 16.2 10*3/uL — ABNORMAL HIGH (ref 3.4–10.8)

## 2020-04-22 LAB — BASIC METABOLIC PANEL
BUN/Creatinine Ratio: 13 (ref 12–28)
BUN: 14 mg/dL (ref 8–27)
CO2: 24 mmol/L (ref 20–29)
Calcium: 9.1 mg/dL (ref 8.7–10.3)
Chloride: 103 mmol/L (ref 96–106)
Creatinine, Ser: 1.1 mg/dL — ABNORMAL HIGH (ref 0.57–1.00)
GFR calc Af Amer: 55 mL/min/{1.73_m2} — ABNORMAL LOW (ref >59)
GFR calc non Af Amer: 48 mL/min/{1.73_m2} — ABNORMAL LOW (ref >59)
Glucose: 91 mg/dL (ref 65–99)
Potassium: 4.3 mmol/L (ref 3.5–5.2)
Sodium: 142 mmol/L (ref 134–144)

## 2020-04-22 LAB — HEPATIC FUNCTION PANEL
ALT: 13 IU/L (ref 0–32)
AST: 20 IU/L (ref 0–40)
Albumin: 4.3 g/dL (ref 3.7–4.7)
Alkaline Phosphatase: 84 IU/L (ref 48–121)
Bilirubin Total: 0.6 mg/dL (ref 0.0–1.2)
Bilirubin, Direct: 0.16 mg/dL (ref 0.00–0.40)
Total Protein: 7.7 g/dL (ref 6.0–8.5)

## 2020-04-22 LAB — LIPID PANEL
Chol/HDL Ratio: 4 ratio (ref 0.0–4.4)
Cholesterol, Total: 155 mg/dL (ref 100–199)
HDL: 39 mg/dL — ABNORMAL LOW (ref >39)
LDL Chol Calc (NIH): 82 mg/dL (ref 0–99)
Triglycerides: 201 mg/dL — ABNORMAL HIGH (ref 0–149)
VLDL Cholesterol Cal: 34 mg/dL (ref 5–40)

## 2020-04-22 LAB — TSH: TSH: 3.33 u[IU]/mL (ref 0.450–4.500)

## 2020-04-25 ENCOUNTER — Telehealth: Payer: Self-pay

## 2020-04-25 MED ORDER — ATORVASTATIN CALCIUM 80 MG PO TABS
80.0000 mg | ORAL_TABLET | Freq: Every day | ORAL | 3 refills | Status: DC
Start: 2020-04-25 — End: 2020-05-01

## 2020-04-25 NOTE — Telephone Encounter (Signed)
-----   Message from Jenean Lindau, MD sent at 04/25/2020  8:53 AM EDT ----- This patient was to tell us about the dose of her statin.  Please call her and find out for me.  Also she needs to cut down her statin dose into half.  CBC is abnormal and she needs to get in touch with her primary care about this.  Please send copy to primary care. Jenean Lindau, MD 04/25/2020 8:52 AM

## 2020-04-25 NOTE — Telephone Encounter (Signed)
Pt made aware to decrease her Atorvastatin to 40 mg once daily. Pt verbalized understanding and thanked me for the call.

## 2020-05-01 ENCOUNTER — Other Ambulatory Visit: Payer: Self-pay | Admitting: Cardiology

## 2020-05-01 MED ORDER — ATORVASTATIN CALCIUM 80 MG PO TABS: 80.0000 mg | ORAL_TABLET | Freq: Every day | ORAL | 3 refills | Status: DC

## 2020-05-01 NOTE — Telephone Encounter (Signed)
*  STAT* If patient is at the pharmacy, call can be transferred to refill team.   1. Which medications need to be refilled? (please list name of each medication and dose if known) atorvastatin 40 mg patient states the doctor changed it to 40 mg  2. Which pharmacy/location (including street and city if local pharmacy) is medication to be sent to? [facility]   3. Do they need a 30 day or 90 day supply? [place]

## 2020-05-01 NOTE — Telephone Encounter (Signed)
Refill sent in per request.  

## 2020-05-02 ENCOUNTER — Telehealth: Payer: Self-pay | Admitting: Cardiology

## 2020-05-02 MED ORDER — ATORVASTATIN CALCIUM 40 MG PO TABS: 40.0000 mg | ORAL_TABLET | Freq: Every day | ORAL | 1 refills | Status: DC

## 2020-05-02 NOTE — Telephone Encounter (Signed)
Called patient. She reports that last week she was told to decrease atorvastatin down to 40 mg daily but never got that dose sent to pharmacy. I see documentation where she was supposed to decrease not sure why it got sent in wrong. However I have sent the correct dose in for her now no further questions.

## 2020-05-02 NOTE — Telephone Encounter (Signed)
Pt c/o medication issue:  1. Name of Medication: atorvastatin (LIPITOR) 80 MG tablet  2. How are you currently taking this medication (dosage and times per day)? Has not taken in a week  3. Are you having a reaction (difficulty breathing--STAT)? No   4. What is your medication issue? Yolanda Flowers is calling stating she was supposed to be prescribed 40 MG's not 80 MG's based on her last appointment with Dr. Geraldo Pitter. Please advise.

## 2020-05-22 ENCOUNTER — Ambulatory Visit (INDEPENDENT_AMBULATORY_CARE_PROVIDER_SITE_OTHER): Payer: PPO

## 2020-05-22 DIAGNOSIS — I6349 Cerebral infarction due to embolism of other cerebral artery: Secondary | ICD-10-CM | POA: Diagnosis not present

## 2020-05-22 LAB — CUP PACEART REMOTE DEVICE CHECK
Date Time Interrogation Session: 20210929230449
Implantable Pulse Generator Implant Date: 20210413

## 2020-05-24 NOTE — Progress Notes (Signed)
Carelink Summary Report / Loop Recorder 

## 2020-05-26 ENCOUNTER — Other Ambulatory Visit: Payer: Self-pay | Admitting: Hematology and Oncology

## 2020-05-26 DIAGNOSIS — R918 Other nonspecific abnormal finding of lung field: Secondary | ICD-10-CM | POA: Diagnosis not present

## 2020-05-26 DIAGNOSIS — I2510 Atherosclerotic heart disease of native coronary artery without angina pectoris: Secondary | ICD-10-CM | POA: Diagnosis not present

## 2020-05-26 DIAGNOSIS — D473 Essential (hemorrhagic) thrombocythemia: Secondary | ICD-10-CM | POA: Diagnosis not present

## 2020-05-26 DIAGNOSIS — Z8673 Personal history of transient ischemic attack (TIA), and cerebral infarction without residual deficits: Secondary | ICD-10-CM | POA: Diagnosis not present

## 2020-05-26 DIAGNOSIS — D709 Neutropenia, unspecified: Secondary | ICD-10-CM | POA: Diagnosis not present

## 2020-05-26 LAB — BASIC METABOLIC PANEL
BUN: 17 (ref 4–21)
CO2: 27 — AB (ref 13–22)
Chloride: 104 (ref 99–108)
Creatinine: 1.1 (ref 0.5–1.1)
Glucose: 95
Potassium: 4.4 (ref 3.4–5.3)
Sodium: 142 (ref 137–147)

## 2020-05-26 LAB — CBC: RBC: 3.36 — AB (ref 3.87–5.11)

## 2020-05-26 LAB — CBC AND DIFFERENTIAL
HCT: 37 (ref 36–46)
Hemoglobin: 11.7 — AB (ref 12.0–16.0)
Neutrophils Absolute: 11
Platelets: 147 — AB (ref 150–399)
WBC: 15.4

## 2020-05-26 LAB — HEPATIC FUNCTION PANEL
ALT: 16 (ref 7–35)
AST: 25 (ref 13–35)
Alkaline Phosphatase: 77 (ref 25–125)
Bilirubin, Total: 0.6

## 2020-05-26 LAB — COMPREHENSIVE METABOLIC PANEL
Albumin: 4.5 (ref 3.5–5.0)
Calcium: 9.2 (ref 8.7–10.7)

## 2020-06-09 DIAGNOSIS — Z23 Encounter for immunization: Secondary | ICD-10-CM | POA: Diagnosis not present

## 2020-06-19 DIAGNOSIS — N289 Disorder of kidney and ureter, unspecified: Secondary | ICD-10-CM | POA: Diagnosis not present

## 2020-06-19 DIAGNOSIS — J309 Allergic rhinitis, unspecified: Secondary | ICD-10-CM | POA: Diagnosis not present

## 2020-06-19 DIAGNOSIS — E663 Overweight: Secondary | ICD-10-CM | POA: Diagnosis not present

## 2020-06-19 DIAGNOSIS — Z6825 Body mass index (BMI) 25.0-25.9, adult: Secondary | ICD-10-CM | POA: Diagnosis not present

## 2020-06-19 DIAGNOSIS — J45909 Unspecified asthma, uncomplicated: Secondary | ICD-10-CM | POA: Diagnosis not present

## 2020-06-19 DIAGNOSIS — I10 Essential (primary) hypertension: Secondary | ICD-10-CM | POA: Diagnosis not present

## 2020-06-19 DIAGNOSIS — N3281 Overactive bladder: Secondary | ICD-10-CM | POA: Diagnosis not present

## 2020-06-19 DIAGNOSIS — M7989 Other specified soft tissue disorders: Secondary | ICD-10-CM | POA: Diagnosis not present

## 2020-06-19 DIAGNOSIS — R600 Localized edema: Secondary | ICD-10-CM | POA: Diagnosis not present

## 2020-06-19 DIAGNOSIS — M545 Low back pain, unspecified: Secondary | ICD-10-CM | POA: Diagnosis not present

## 2020-06-22 ENCOUNTER — Other Ambulatory Visit: Payer: Self-pay | Admitting: Hematology and Oncology

## 2020-06-22 DIAGNOSIS — D473 Essential (hemorrhagic) thrombocythemia: Secondary | ICD-10-CM

## 2020-06-23 LAB — CUP PACEART REMOTE DEVICE CHECK
Date Time Interrogation Session: 20211101230341
Implantable Pulse Generator Implant Date: 20210413

## 2020-06-26 ENCOUNTER — Ambulatory Visit (INDEPENDENT_AMBULATORY_CARE_PROVIDER_SITE_OTHER): Payer: PPO

## 2020-06-26 DIAGNOSIS — I6349 Cerebral infarction due to embolism of other cerebral artery: Secondary | ICD-10-CM | POA: Diagnosis not present

## 2020-06-27 NOTE — Progress Notes (Signed)
Carelink Summary Report / Loop Recorder 

## 2020-07-04 ENCOUNTER — Other Ambulatory Visit: Payer: Self-pay | Admitting: Hematology and Oncology

## 2020-07-04 DIAGNOSIS — D473 Essential (hemorrhagic) thrombocythemia: Secondary | ICD-10-CM

## 2020-07-05 ENCOUNTER — Inpatient Hospital Stay: Payer: PPO | Attending: Hematology and Oncology | Admitting: Hematology and Oncology

## 2020-07-05 ENCOUNTER — Telehealth: Payer: Self-pay | Admitting: Oncology

## 2020-07-05 ENCOUNTER — Other Ambulatory Visit: Payer: Self-pay

## 2020-07-05 ENCOUNTER — Inpatient Hospital Stay (INDEPENDENT_AMBULATORY_CARE_PROVIDER_SITE_OTHER): Payer: PPO | Admitting: Hematology and Oncology

## 2020-07-05 VITALS — BP 141/67 | HR 67 | Temp 98.5°F | Resp 18 | Ht 63.5 in | Wt 158.1 lb

## 2020-07-05 DIAGNOSIS — D473 Essential (hemorrhagic) thrombocythemia: Secondary | ICD-10-CM | POA: Diagnosis not present

## 2020-07-05 DIAGNOSIS — D709 Neutropenia, unspecified: Secondary | ICD-10-CM | POA: Diagnosis not present

## 2020-07-05 DIAGNOSIS — Z0001 Encounter for general adult medical examination with abnormal findings: Secondary | ICD-10-CM | POA: Diagnosis not present

## 2020-07-05 LAB — CBC AND DIFFERENTIAL
HCT: 37 (ref 36–46)
Hemoglobin: 11.9 — AB (ref 12.0–16.0)
Neutrophils Absolute: 15.29
Platelets: 149 — AB (ref 150–399)
WBC: 18.2

## 2020-07-05 LAB — CBC: RBC: 3.47 — AB (ref 3.87–5.11)

## 2020-07-05 NOTE — Telephone Encounter (Signed)
Per 11/17 LOS, patient scheduled for [facility], Follow up Appts - Gave Summary to patient

## 2020-07-05 NOTE — Progress Notes (Signed)
Yolanda Flowers  [address] [place],  Yolanda Flowers  [id number] [phone]  Clinic Day:  07/07/2020  Referring physician: [name], MD   CHIEF COMPLAINT:   CC:   Follow-up of essential thrombocythemia since hydroxyurea placed on hold 1 month ago  Current Treatment:   On hold.   HISTORY OF PRESENT ILLNESS:  Yolanda Flowers is a 79 y.o. female with a history of with essential thrombocythemia originally diagnosed in 50.  She was treated with hydroxyurea 500 mg 3 times daily.  We have had to occasionally adjust doses because of leukopenia or anemia.  She also had a malignant melanoma of her left first toe resected in November 2015 with partial amputation of the toe and has done well.  She has had multiple other skin cancers removed over the years.  In March 2017, she was seen by Dr. Denman George for HSIL/VIN 3 of the right posterior labia minora and underwent vulvectomy.  Hydroxyurea was decreased to 500 mg twice daily prior to surgery, as we did not feel we could safely hold hydroxyurea.  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  The patient had been seeing the gynecologist every 6 months, but has not continued regular follow-ups there. The platelet count increased in May 2017 to 657,000, so her dose of hydroxyurea was increased to 3 times daily, then subsequently decreased to twice a day due to leukopenia and anemia.  Her platelets have remained between under 500,000 since August 2018.  Bone density scan in March 2018 revealed osteopenia with a T-score of -1.6 in the femur, for which she is on calcium and vitamin-D.  She has had chronic back pain, which is attributed to severe degenerative disease.  She had an MRI of the lumbar spine in March 2018, which revealed significant degenerative disc disease with mild spinal stenosis at L1 and L4, but moderate at L2-3.  Dr. Maryjean Ka has  given her injections, as well at hydrocodone/APAP 5/325.  She has had intermittent leukocytosis since 2018, felt to be secondary to her essential thrombocythemia.  She was evaluated in the emergency department in October 2019 and had a GI bleed. CT abdomen and pelvis revealed acute uncomplicated diverticulitis of the sigmoid colon with no other acute abnormalities.  A moderate to large hiatal hernia was seen.  No focal hepatic lesions or intrahepatic biliary dilatation were seen.  She saw Dr. Geraldo Pitter and underwent nuclear med stress test in November, which did not reveal any evidence of inducible ischemia or other abnormality.  Left ventricular size and function was normal with an ejection fraction of 79%.  She states she was placed on Spiriva and Ventolin.  She underwent EGD and colonoscopy in December  2019, and no significant source of blood loss was identified.  Repeat iron studies, B12 and folate did not reveal any nutritional deficiency  She was instructed to continue lansoprazole.  She had dilatation of an esophageal stricture and removal of a polyp.  Pathology of the polyp revealed a fragmented tubular adenoma.   When she was seen in January 2020, she had worsening anemia, in addition to continued episodes of chest pain and worsening dyspnea with exertion at her routine follow-up.  Due to the severe dyspnea and right lower extremity edema, we obtained a CT angiogram chest and venous Doppler ultrasound of the right lower extremity.  CTA chest did not reveal any evidence of pulmonary embolism.  There was  good opacification of the pulmonary arteries.  Diffuse coronary artery calcifications with cardiomegaly and moderate thoracic aortic atherosclerosis was seen.  There was a 3.6 mm noncalcified nodule in the anterior inferior right upper lobe.  Noncontrast CT chest in 6-12 months was recommended.  Right lower extremity venous Doppler ultrasound did not reveal any evidence of deep venous thrombosis.  Further  evaluation of the anemia did not reveal evidence of hemolysis or monoclonal gammopathy.  She was seen in February 2020 for routine follow up and had persistent anemia with a hemoglobin of 9.7.  As she was symptomatic, hydroxyurea 500 mg was decreased to once daily.  Soluble transferrin was elevated, which was consistent with iron deficiency.  She saw Dr. Lyda Jester and he placed her on iron supplementation daily in the form of ferrous sulfate 65 mg. She was seen again in March and had improvement in her anemia with a hemoglobin of 10.2.  Her thrombocythemia remained controlled on hydroxyurea 500 mg daily. We encouraged her to have follow-up for her vulvar cancer and she did see Dr. Carlena Bjornstad and her exam was normal.   He did not feel he needed to check her again for 2 years unless she had problems.  Due to chronic dyspnea, she underwent multiple tests at [facility].  Apparently, she did not have evidence of COPD.  She was therefore referred to Cardiology.  When she was seen for routine follow-up in January 2021, she was not sure if she was up-to-date on screening mammogram, so we ordered this.  She had also seen Dr. Geraldo Pitter and was undergoing cardiac evaluation.  She had a bilateral screening mammogram in March 2021, which revealed an area of calcifications in the right breast warranting further evaluation.  A diagnostic right mammogram was ordered, but this was delayed, because she underwent the open heart surgery with CABG x4 on March 18th.  She states she had a monitor placed in the left chest wall the time of surgery.  She does not feel she can have mammogram due to the monitor in the left chest wall.  She then had 2 strokes following her surgery, from which she has recovered well, except for balance issues and left eye blindness.  She ambulates with a cane or walker depending on her circumstances.  She is now on clopidogrel 75 mg daily, atorvastatin 80 mg daily, metoprolol 25 mg twice daily and  pantoprazole 40 mg daily.  She had a venous Doppler ultrasound of the right lower extremity in March for swelling postoperatively which did not reveal any evidence of deep venous thrombosis.  She had a CT chest on July 6th to follow-up on the pulmonary nodule and this was stable.  Unilateral right mammogram from July revealed a likely benign 6 mm group of calcifications involving the upper inner quadrant at middle depth.  She will be due for annual bilateral mammogram in February, but she has a loop recorder on the left side.  She will have the loop recorder for 4 years.  The mammography department told us she can have a mammogram with the loop recorder in place.  At her visit in October, she reported elevated transaminases and purpura, which she attributed to atorvastatin.  Dr. Geraldo Pitter reduced the dose from 80mg  to 40mg  with normalization of the transaminases and no further purpura.  Her platelets were in low normal range, so we held hydroxyurea for a month.  INTERVAL HISTORY:  Reise is seen in the hematology clinic for follow up of her essential  thrombocythemia since hydroxyurea placed on hold last month. She denies fever, chills or night sweats. She  denies pain. Her appetite is good. Her weight has been stable.  She has not had a COVID-19 vaccine and does not feel that she will get this.  REVIEW OF SYSTEMS:  Review of Systems  Constitutional: Positive for fatigue. Negative for appetite change, chills and fever.  HENT:   Negative for lump/mass, mouth sores and sore throat.   Respiratory: Negative for cough and shortness of breath.   Cardiovascular: Negative for chest pain and leg swelling.  Gastrointestinal: Negative for abdominal pain, constipation, diarrhea, nausea and vomiting.  Genitourinary: Negative for difficulty urinating, dysuria, frequency and hematuria.   Musculoskeletal: Positive for back pain.  Neurological: Negative for dizziness and headaches.  Psychiatric/Behavioral: Negative  for depression. The patient is nervous/anxious.      VITALS:  Vital signs today include blood pressure 141/67, pulse 67, respirations 18 and temperature 98.5.  Pulse oximetry is 96% on room air.  Wt Readings from Last 3 Encounters:  07/05/20 158 lb 1.6 oz (71.7 kg)  03/22/20 158 lb (71.7 kg)  12/22/19 159 lb (72.1 kg)    There is no height or weight on file to calculate BMI.  Performance status (ECOG): 1 - Symptomatic but completely ambulatory  PHYSICAL EXAM:  Physical Exam Vitals and nursing note reviewed.  Constitutional:      General: She is not in acute distress.    Appearance: Normal appearance.  HENT:     Mouth/Throat:     Mouth: Mucous membranes are moist.     Pharynx: Oropharynx is clear.  Eyes:     General: No scleral icterus.    Extraocular Movements: Extraocular movements intact.     Conjunctiva/sclera: Conjunctivae normal.     Pupils: Pupils are equal, round, and reactive to light.  Cardiovascular:     Rate and Rhythm: Normal rate and regular rhythm.     Heart sounds: Normal heart sounds. No murmur heard.  No friction rub. No gallop.   Pulmonary:     Effort: No respiratory distress.     Breath sounds: Normal breath sounds. No stridor. No wheezing, rhonchi or rales.  Abdominal:     General: There is no distension.     Palpations: Abdomen is soft. There is no mass.     Tenderness: There is no abdominal tenderness. There is no guarding.     Hernia: No hernia is present.  Musculoskeletal:     Cervical back: Neck supple.     Right lower leg: No edema.     Left lower leg: No edema.  Lymphadenopathy:     Cervical: No cervical adenopathy.  Skin:    General: Skin is warm and dry.     Findings: No rash.  Neurological:     Mental Status: She is alert and oriented to person, place, and time. Mental status is at baseline.     Cranial Nerves: No cranial nerve deficit.  Psychiatric:        Mood and Affect: Mood normal.        Behavior: Behavior normal.    Lymph  nodes:   There is no cervical, clavicular, axillary or inguinal lymphadenopathy.   LABS:   CBC Latest Ref Rng & Units 07/05/2020 05/26/2020 04/21/2020  WBC - 18.2 15.4 16.2(H)  Hemoglobin 12.0 - 16.0 11.9(A) 11.7(A) 11.6  Hematocrit 36 - 46 37 37 34.9  Platelets 150 - 399 149(A) 147(A) 149(L)   CMP Latest Ref Rng &  Units 05/26/2020 04/21/2020 01/21/2020  Glucose 65 - 99 mg/dL - 91 93  BUN 4 - 21 17 14 13   Creatinine 0.5 - 1.1 1.1 1.10(H) 1.09(H)  Sodium 137 - 147 142 142 142  Potassium 3.4 - 5.3 4.4 4.3 4.1  Chloride 99 - 108 104 103 103  CO2 13 - 22 27(A) 24 23  Calcium 8.7 - 10.7 9.2 9.1 8.9  Total Protein 6.0 - 8.5 g/dL - 7.7 7.5  Total Bilirubin 0.0 - 1.2 mg/dL - 0.6 0.6  Alkaline Phos 25 - 125 77 84 84  AST 13 - 35 25 20 24   ALT 7 - 35 16 13 14      No results found for: CEA1 / No results found for: CEA1 No results found for: PSA1 No results found for: HFW263 No results found for: CAN125  No results found for: TOTALPROTELP, ALBUMINELP, A1GS, A2GS, BETS, BETA2SER, GAMS, MSPIKE, SPEI No results found for: TIBC, FERRITIN, IRONPCTSAT No results found for: LDH  STUDIES:  CUP PACEART REMOTE DEVICE CHECK  Result Date: 06/23/2020 ILR summary report received. Battery status OK. Normal device function. No new symptom, tachy, brady, or pause episodes. No new AF episodes. Monthly summary reports and ROV/PRN     HISTORY:   Past Medical History:  Diagnosis Date  . Abdominal pain   . Accelerating angina ([place]) 08/26/2019  . Allergic rhinitis 06/21/2014  . Anxiety   . Arthritis   . Asthma   . Atherosclerosis of arteries 08/24/2019  . Bilateral edema of lower extremity   . Body mass index 29.0-29.9, adult 08/24/2019  . CAD (coronary artery disease) 10/25/2019  . Cellulitis of right leg   . Combined hyperlipidemia 08/24/2019  . Complication of anesthesia    difficulty waking  . Congestive heart failure (CHF) ([place])   . COPD (chronic obstructive pulmonary disease) ([place])   . Coronary  artery disease   . Dyspnea 02/09/2019  . Dyspnea on exertion   . Embolic stroke ([place]) 7/85/8850  . Essential thrombocytosis ([place]) 06/22/2014  . Gastrointestinal bleeding 06/23/2018  . GERD (gastroesophageal reflux disease)   . History of melanoma excision    left greast toe 2015  . History of squamous cell carcinoma excision    face--  multiple excisions  . History of subdural hematoma    2008  . HTN (hypertension) 06/21/2014  . Hypertension   . Ischial bursitis, right 08/24/2019  . Malignant melanoma of great toe ([place]) 08/24/2019  . Melanoma in situ ([place]) 06/29/2014  . OSA (obstructive sleep apnea)    intolerant  . Pedal edema 08/24/2019  . Renal insufficiency 08/24/2019  . S/P CABG x 4 11/04/2019  . Skin cancer 08/24/2019  . Solitary pulmonary nodule on lung CT 02/09/2019  . Squamous cell carcinoma, scalp/neck 08/24/2019  . Stroke due to embolism ([place]) 11/25/2019  . Swelling of limb 08/24/2019  . Thrombocytosis    takes hydoxyurea--  MONITORED BY DR Hinton Rao [facility])  . VIN III (vulvar intraepithelial neoplasia III)   . Vitamin B 12 deficiency 08/24/2019  . Vitamin D deficiency 08/24/2019  . Wears dentures    UPPER AND LOWER PARTIAL  . Wears glasses     Past Surgical History:  Procedure Laterality Date  . ABDOMINAL HYSTERECTOMY  age 39  . CARDIAC CATHETERIZATION  03/172021  . CORONARY ARTERY BYPASS GRAFT N/A 11/04/2019   Procedure: CORONARY ARTERY BYPASS GRAFTING (CABG) x 4, with ENDOSCOPIC HARVESTING OF RIGHT GREATER SAPHENOUS VEIN.;  Surgeon: Gaye Pollack, MD;  Location: [facility] OR;  Service: Open Heart Surgery;  Laterality: N/A;  . INTRAVASCULAR ULTRASOUND/IVUS N/A 11/03/2019   Procedure: Intravascular Ultrasound/IVUS;  Surgeon: Nelva Bush, MD;  Location: [place] [facility] LAB;  Service: Cardiovascular;  Laterality: N/A;  . KNEE ARTHROSCOPY Left 2004  . LEFT HEART CATH AND CORONARY ANGIOGRAPHY N/A 11/03/2019   Procedure: LEFT HEART CATH AND CORONARY ANGIOGRAPHY;  Surgeon: Nelva Bush, MD;  Location: [place] [facility] LAB;  Service: Cardiovascular;  Laterality: N/A;  . LOOP RECORDER INSERTION N/A 11/30/2019   Procedure: LOOP RECORDER INSERTION;  Surgeon: Thompson Grayer, MD;  Location: [place] [facility] LAB;  Service: Cardiovascular;  Laterality: N/A;  . MELANOMA EXCISION  2015   left great toe  . REPAIR PERONEAL TENDONS ANKLE  2004  . SUBDURAL HEMATOMA EVACUATION VIA CRANIOTOMY  2008      week later  post-op  [facility]  . TEE WITHOUT CARDIOVERSION N/A 11/04/2019   Procedure: TRANSESOPHAGEAL ECHOCARDIOGRAM (TEE);  Surgeon: Gaye Pollack, MD;  Location: [place];  Service: Open Heart Surgery;  Laterality: N/A;  . VULVECTOMY N/A 10/24/2015   Procedure: WIDE LOCAL EXCISION OF THE VULVA ;  Surgeon: Everitt Amber, MD;  Location: [place];  Service: Gynecology;  Laterality: N/A;    Family History  Problem Relation Age of Onset  . Clotting disorder Mother   . Hypertension Mother   . Heart disease Mother   . Arthritis Mother   . Stroke Father   . Hypertension Father   . Heart disease Father   . Arthritis Father   . Emphysema Father   . Asthma Father   . Kidney failure Father   . Hypertension Sister   . Heart disease Sister   . Arthritis Sister   . Hypertension Brother   . Heart disease Brother   . Arthritis Brother     Social History:  reports that she has never smoked. She has never used smokeless tobacco. She reports that she does not drink alcohol and does not use drugs.The patient is alone today.  Allergies:  Allergies  Allergen Reactions  . Cephalexin Hives  . Ciprofloxacin Other (See Comments)    Gi intolerance  . Penicillins Rash    Childhood reaction Did it involve swelling of the face/tongue/throat, SOB, or low BP? No Did it involve sudden or severe rash/hives, skin peeling, or any reaction on the inside of your mouth or nose? No Did you need to seek medical attention at a hospital or doctor's office? No When did it last  happen?50 years ago If all above answers are "NO", may proceed with cephalosporin use.     Current Medications: Current Outpatient Medications  Medication Sig Dispense Refill  . acetaminophen (TYLENOL) 325 MG tablet Take 1-2 tablets (325-650 mg total) by mouth every 4 (four) hours as needed for mild pain.    . Ascorbic Acid (VITAMIN C) 1000 MG tablet Take 1,000 mg by mouth daily.    Marland Kitchen atorvastatin (LIPITOR) 40 MG tablet Take 1 tablet (40 mg total) by mouth daily at 6 PM. 90 tablet 1  . Calcium Carb-Cholecalciferol (CALCIUM 600 + D PO) Take 1 tablet by mouth in the morning and at bedtime.     . clopidogrel (PLAVIX) 75 MG tablet Take 1 tablet (75 mg total) by mouth daily. 90 tablet 3  . fluticasone (FLONASE) 50 MCG/ACT nasal spray Place 2 sprays into both nostrils at bedtime.     . hydroxyurea (HYDREA) 500 MG capsule Take 500 mg  by mouth daily.     . Magnesium 250 MG TABS Take 250 mg by mouth at bedtime.     . metoprolol tartrate (LOPRESSOR) 25 MG tablet Take 1 tablet (25 mg total) by mouth 2 (two) times daily. 180 tablet 3  . Multiple Vitamins-Minerals (PRESERVISION AREDS 2) CAPS Take 1 capsule by mouth in the morning and at bedtime.     . pantoprazole (PROTONIX) 40 MG tablet Take 1 tablet (40 mg total) by mouth daily. 90 tablet 3  . traMADol (ULTRAM) 50 MG tablet Take 1 tablet (50 mg total) by mouth every 6 (six) hours as needed for moderate pain. 30 tablet   . Vitamin D, Ergocalciferol, (DRISDOL) 1.25 MG (50000 UT) CAPS capsule Take 50,000 Units by mouth every 14 (fourteen) days.     No current facility-administered medications for this visit.     ASSESSMENT & PLAN:   Assessment/Plan: 1. Essential thrombocythemia, well controlled with hydroxyurea 500 mg once daily.  Hydroxyurea was placed on hold in October.  The platelets remain low normal, so Dr. Hinton Rao recommends continuing to hold this. 2. Leukocytosis/neutrophilia, which she has had on and off since 2018.  This is felt to  be due to her myeloproliferative disease and is slightly worse since discontinuation of hydroxyurea.  We will continue to monitor this.  There is no evidence of transformation to acute myelogenous leukemia. 3. Severe exertional dyspnea and chest pain due to coronary artery disease.  This resolved with coronary artery bypass grafting. 4. Macrocytic anemia, secondary to hydroxyurea, which has improved with discontinuation of hydroxyurea.  5. History of vulvar cancer, treated with surgical excision. 6. Right upper lobe nodule on CT chest seen last year.  Pulmonology is following this and recent CT chest was stable. 7. History of melanoma of the great toe, treated with surgical excision.  She remains without evidence of recurrence 8. Chronic back pain, which is being managed with narcotics by Dr. Maryjean Ka.  9. Status post CABG x4 in March 2021. 10. Status post CVA x2 in March and April.  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 11. New calcifications in the right breast in March 2021 warranting further evaluation.  Unilateral diagnostic mammogram from July characterized these as likely benign. She will be due for bilateral diagnostic mammogram in February. 12. Apparent iron deficiency, for which she has been on oral supplementation, with improvement in her hemoglobin.  13. Chronic kidney disease, which has been stable.    She knows to continue to hold hydroxyurea 500 mg daily until her next visit.  We will plan to see her back in 1-2 months with CBC for continued observation.    She continues to decline the COVID-19 vaccine  The patient understands the plans discussed today and is in agreement with them.  She knows to contact our office if she develops concerns regarding her myeloproliferative disease.   Marvia Pickles, PA-C

## 2020-07-06 ENCOUNTER — Encounter: Payer: Self-pay | Admitting: Hematology and Oncology

## 2020-07-19 DIAGNOSIS — D75839 Thrombocytosis, unspecified: Secondary | ICD-10-CM | POA: Diagnosis not present

## 2020-07-19 DIAGNOSIS — E785 Hyperlipidemia, unspecified: Secondary | ICD-10-CM | POA: Diagnosis not present

## 2020-07-19 DIAGNOSIS — M545 Low back pain, unspecified: Secondary | ICD-10-CM | POA: Diagnosis not present

## 2020-07-19 DIAGNOSIS — Z8673 Personal history of transient ischemic attack (TIA), and cerebral infarction without residual deficits: Secondary | ICD-10-CM | POA: Diagnosis not present

## 2020-07-19 DIAGNOSIS — Z6825 Body mass index (BMI) 25.0-25.9, adult: Secondary | ICD-10-CM | POA: Diagnosis not present

## 2020-07-19 DIAGNOSIS — E663 Overweight: Secondary | ICD-10-CM | POA: Diagnosis not present

## 2020-07-21 DIAGNOSIS — L570 Actinic keratosis: Secondary | ICD-10-CM | POA: Diagnosis not present

## 2020-07-28 ENCOUNTER — Other Ambulatory Visit: Payer: Self-pay | Admitting: Hematology and Oncology

## 2020-07-28 DIAGNOSIS — D473 Essential (hemorrhagic) thrombocythemia: Secondary | ICD-10-CM

## 2020-07-30 LAB — CUP PACEART REMOTE DEVICE CHECK
Date Time Interrogation Session: 20211204230355
Implantable Pulse Generator Implant Date: 20210413

## 2020-07-31 ENCOUNTER — Ambulatory Visit (INDEPENDENT_AMBULATORY_CARE_PROVIDER_SITE_OTHER): Payer: PPO

## 2020-07-31 DIAGNOSIS — I6349 Cerebral infarction due to embolism of other cerebral artery: Secondary | ICD-10-CM

## 2020-08-08 DIAGNOSIS — E785 Hyperlipidemia, unspecified: Secondary | ICD-10-CM | POA: Diagnosis not present

## 2020-08-08 DIAGNOSIS — Z139 Encounter for screening, unspecified: Secondary | ICD-10-CM | POA: Diagnosis not present

## 2020-08-08 DIAGNOSIS — Z1331 Encounter for screening for depression: Secondary | ICD-10-CM | POA: Diagnosis not present

## 2020-08-08 DIAGNOSIS — Z0000 Encounter for general adult medical examination without abnormal findings: Secondary | ICD-10-CM | POA: Diagnosis not present

## 2020-08-08 DIAGNOSIS — Z9181 History of falling: Secondary | ICD-10-CM | POA: Diagnosis not present

## 2020-08-15 NOTE — Progress Notes (Signed)
Carelink Summary Report / Loop Recorder 

## 2020-08-21 NOTE — Progress Notes (Signed)
[facility]  [address] [place],  [place]  [address] [phone]  Clinic Day:  08/23/2020  Referring physician: [name], MD   CHIEF COMPLAINT:   CC:   Follow-up of essential thrombocythemia since hydroxyurea was placed on hold in October  Current Treatment:   On hold.   HISTORY OF PRESENT ILLNESS:  Yolanda Flowers is a 80 y.o. female with a history of with essential thrombocythemia originally diagnosed in 3.  She was treated with hydroxyurea 500 mg 3 times daily.  We have had to occasionally adjust doses because of leukopenia or anemia.  She also had a malignant melanoma of her left first toe resected in November 2015 with partial amputation of the toe and has done well.  She has had multiple other skin cancers removed over the years.  In March 2017, she was seen by Dr. Denman George for HSIL/VIN 3 of the right posterior labia minora and underwent vulvectomy.  Hydroxyurea was decreased to 500 mg twice daily prior to surgery, as we did not feel we could safely hold hydroxyurea.  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  The patient had been seeing the gynecologist every 6 months, but has not continued regular follow-ups there. The platelet count increased in May 2017 to 657,000, so her dose of hydroxyurea was increased to 3 times daily, then subsequently decreased to twice a day due to leukopenia and anemia.  Her platelets have remained between under 500,000 since August 2018.  Bone density scan in March 2018 revealed osteopenia with a T-score of -1.6 in the femur, for which she is on calcium and vitamin-D.  She has had chronic back pain, which is attributed to severe degenerative disease.  She had an MRI of the lumbar spine in March 2018, which revealed significant degenerative disc disease with mild spinal stenosis at L1 and L4, but moderate at L2-3.  Dr. Maryjean Ka has  given her injections, as well at hydrocodone/APAP 5/325.  She has had intermittent leukocytosis since 2018, felt to be secondary to her essential thrombocythemia.  She was evaluated in the emergency department in October 2019 and had a GI bleed. CT abdomen and pelvis revealed acute uncomplicated diverticulitis of the sigmoid colon with no other acute abnormalities.  A moderate to large hiatal hernia was seen.  No focal hepatic lesions or intrahepatic biliary dilatation were seen.  She saw Dr. Geraldo Pitter and underwent nuclear med stress test in November, which did not reveal any evidence of inducible ischemia or other abnormality.  Left ventricular size and function was normal with an ejection fraction of 79%.  She states she was placed on Spiriva and Ventolin.  She underwent EGD and colonoscopy in December  2019, and no significant source of blood loss was identified.  Repeat iron studies, B12 and folate did not reveal any nutritional deficiency  She was instructed to continue lansoprazole.  She had dilatation of an esophageal stricture and removal of a polyp.  Pathology of the polyp revealed a fragmented tubular adenoma.   When she was seen in January 2020, she had worsening anemia, in addition to continued episodes of chest pain and worsening dyspnea with exertion at her routine follow-up.  Due to the severe dyspnea and right lower extremity edema, we obtained a CT angiogram chest and venous Doppler ultrasound of the right lower extremity.  CTA chest did not reveal any evidence of pulmonary embolism.  There was  good opacification of the pulmonary arteries.  Diffuse coronary artery calcifications with cardiomegaly and moderate thoracic aortic atherosclerosis was seen.  There was a 3.6 mm noncalcified nodule in the anterior inferior right upper lobe.  Noncontrast CT chest in 6-12 months was recommended.  Right lower extremity venous Doppler ultrasound did not reveal any evidence of deep venous thrombosis.  Further  evaluation of the anemia did not reveal evidence of hemolysis or monoclonal gammopathy.  She was seen in February 2020 for routine follow up and had persistent anemia with a hemoglobin of 9.7.  As she was symptomatic, hydroxyurea 500 mg was decreased to once daily.  Soluble transferrin was elevated, which was consistent with iron deficiency.  She saw Dr. Lyda Jester and he placed her on iron supplementation daily in the form of ferrous sulfate 65 mg. She was seen again in March and had improvement in her anemia with a hemoglobin of 10.2.  Her thrombocythemia remained controlled on hydroxyurea 500 mg daily. We encouraged her to have follow-up for her vulvar cancer and she did see Dr. Carlena Bjornstad and her exam was normal.   He did not feel he needed to check her again for 2 years unless she had problems.  Due to chronic dyspnea, she underwent multiple tests at [facility].  Apparently, she did not have evidence of COPD.  She was therefore referred to Cardiology.  When she was seen for routine follow-up in January 2021, she was not sure if she was up-to-date on screening mammogram, so we ordered this.  She had also seen Dr. Geraldo Pitter and was undergoing cardiac evaluation.  She had a bilateral screening mammogram in March 2021, which revealed an area of calcifications in the right breast warranting further evaluation.  A diagnostic right mammogram was ordered, but this was delayed, because she underwent the open heart surgery with CABG x4 on March 18th.  She states she had a monitor placed in the left chest wall the time of surgery.  She does not feel she can have mammogram due to the monitor in the left chest wall.  She then had 2 strokes following her surgery, from which she has recovered well, except for balance issues and left eye blindness.  She ambulates with a cane or walker depending on her circumstances.  She is now on clopidogrel 75 mg daily, atorvastatin 80 mg daily, metoprolol 25 mg twice daily and  pantoprazole 40 mg daily.  She had a venous Doppler ultrasound of the right lower extremity in March for swelling postoperatively which did not reveal any evidence of deep venous thrombosis.  She had a CT chest on July 6th to follow-up on the pulmonary nodule and this was stable.  Unilateral right mammogram from July revealed a likely benign 6 mm group of calcifications involving the upper inner quadrant at middle depth.  She will be due for annual bilateral mammogram in February, but she has a loop recorder on the left side.  She will have the loop recorder for 4 years.  The mammography department told us she can have a mammogram with the loop recorder in place.  At her visit in October 2021, she reported elevated transaminases and purpura, which she attributed to atorvastatin.  Dr. Geraldo Pitter reduced the dose from 80mg  to 40mg  with normalization of the transaminases and no further purpura.  Her platelets were in low normal range, so we held hydroxyurea since October.  INTERVAL HISTORY:  Yolanda Flowers is here for routine follow up and states that she has been  doing fairly well other than back pain, which has been long standing.  Her medications have been changed and she is now on tramadol, but feels this does not give her as much relief, but does help her arthritis.  She rates her pain as a 10/10 today.  She is scheduled with dermatology in February.  She continues to hold hydroxyurea as instructed and has remained off of this since October.  Her white count is fairly stable at 17.4, her hemoglobin is stable at 11.8, and her platelet count has mildly decreased from 149,000 to 136,000.  Her  appetite is good, and she has gained 1 pound since her last visit.  She denies fever, chills or other signs of infection.  She denies nausea, vomiting, bowel issues, or abdominal pain.  She denies sore throat, cough, dyspnea, or chest pain.  REVIEW OF SYSTEMS:  Review of Systems  Constitutional: Negative.   HENT:  Negative.    Eyes: Negative.   Respiratory: Negative.   Cardiovascular: Negative.   Gastrointestinal: Negative.   Endocrine: Negative.   Genitourinary: Negative.    Musculoskeletal: Positive for arthralgias and back pain (chronic).  Skin: Negative.   Neurological: Negative.   Hematological: Negative.   Psychiatric/Behavioral: Negative.      VITALS:  Vital signs today include blood pressure 166/79, pulse 65, respirations 18 and temperature 98.2.  Pulse oximetry is 98% on room air.  Wt Readings from Last 3 Encounters:  08/23/20 159 lb 11.2 oz (72.4 kg)  07/05/20 158 lb 1.6 oz (71.7 kg)  03/22/20 158 lb (71.7 kg)    Body mass index is 27.85 kg/m.  Performance status (ECOG): 1 - Symptomatic but completely ambulatory  PHYSICAL EXAM:  Physical Exam Constitutional:      General: She is not in acute distress.    Appearance: Normal appearance. She is normal weight.  HENT:     Head: Normocephalic and atraumatic.  Eyes:     General: No scleral icterus.    Extraocular Movements: Extraocular movements intact.     Conjunctiva/sclera: Conjunctivae normal.     Pupils: Pupils are equal, round, and reactive to light.  Cardiovascular:     Rate and Rhythm: Normal rate and regular rhythm.     Pulses: Normal pulses.     Heart sounds: Normal heart sounds. No murmur heard. No friction rub. No gallop.   Pulmonary:     Effort: Pulmonary effort is normal. No respiratory distress.     Breath sounds: Normal breath sounds.  Abdominal:     General: Bowel sounds are normal. There is no distension.     Palpations: Abdomen is soft. There is no hepatomegaly, splenomegaly or mass.     Tenderness: There is no abdominal tenderness.  Musculoskeletal:        General: Normal range of motion.     Cervical back: Normal range of motion and neck supple.     Right lower leg: Edema (worse than left) present.     Left lower leg: Edema (trace) present.  Lymphadenopathy:     Cervical: No cervical adenopathy.  Skin:     General: Skin is warm and dry.  Neurological:     General: No focal deficit present.     Mental Status: She is alert and oriented to person, place, and time. Mental status is at baseline.  Psychiatric:        Mood and Affect: Mood normal.        Behavior: Behavior normal.  Thought Content: Thought content normal.        Judgment: Judgment normal.    Lymph nodes:   There is no cervical, clavicular, axillary or inguinal lymphadenopathy.   LABS:   CBC Latest Ref Rng & Units 07/05/2020 05/26/2020 04/21/2020  WBC - 18.2 15.4 16.2(H)  Hemoglobin 12.0 - 16.0 11.9(A) 11.7(A) 11.6  Hematocrit 36 - 46 37 37 34.9  Platelets 150 - 399 149(A) 147(A) 149(L)   CMP Latest Ref Rng & Units 05/26/2020 04/21/2020 01/21/2020  Glucose 65 - 99 mg/dL - 91 93  BUN 4 - 21 17 14 13   Creatinine 0.5 - 1.1 1.1 1.10(H) 1.09(H)  Sodium 137 - 147 142 142 142  Potassium 3.4 - 5.3 4.4 4.3 4.1  Chloride 99 - 108 104 103 103  CO2 13 - 22 27(A) 24 23  Calcium 8.7 - 10.7 9.2 9.1 8.9  Total Protein 6.0 - 8.5 g/dL - 7.7 7.5  Total Bilirubin 0.0 - 1.2 mg/dL - 0.6 0.6  Alkaline Phos 25 - 125 77 84 84  AST 13 - 35 25 20 24   ALT 7 - 35 16 13 14     STUDIES:  No results found.    HISTORY:   Allergies:  Allergies  Allergen Reactions  . Cephalexin Hives  . Ciprofloxacin Other (See Comments)    Gi intolerance  . Penicillins Rash    Childhood reaction Did it involve swelling of the face/tongue/throat, SOB, or low BP? No Did it involve sudden or severe rash/hives, skin peeling, or any reaction on the inside of your mouth or nose? No Did you need to seek medical attention at a hospital or doctor's office? No When did it last happen?50 years ago If all above answers are "NO", may proceed with cephalosporin use.     Current Medications: Current Outpatient Medications  Medication Sig Dispense Refill  . acetaminophen (TYLENOL) 325 MG tablet Take 1-2 tablets (325-650 mg total) by mouth every 4 (four) hours  as needed for mild pain.    . Ascorbic Acid (VITAMIN C) 1000 MG tablet Take 1,000 mg by mouth daily.    Marland Kitchen atorvastatin (LIPITOR) 40 MG tablet Take 1 tablet (40 mg total) by mouth daily at 6 PM. 90 tablet 1  . budesonide-formoterol (SYMBICORT) 160-4.5 MCG/ACT inhaler Inhale into the lungs.    . Calcium Carb-Cholecalciferol (CALCIUM 600 + D PO) Take 1 tablet by mouth in the morning and at bedtime.     . clopidogrel (PLAVIX) 75 MG tablet Take 1 tablet (75 mg total) by mouth daily. 90 tablet 3  . fluticasone (FLONASE) 50 MCG/ACT nasal spray Place 2 sprays into both nostrils at bedtime.     . hydroxyurea (HYDREA) 500 MG capsule Take 500 mg by mouth daily.     . Magnesium 250 MG TABS Take 250 mg by mouth at bedtime.     . metoprolol tartrate (LOPRESSOR) 25 MG tablet Take 1 tablet (25 mg total) by mouth 2 (two) times daily. 180 tablet 3  . Multiple Vitamins-Minerals (PRESERVISION AREDS 2) CAPS Take 1 capsule by mouth in the morning and at bedtime.     Marland Kitchen oxybutynin (DITROPAN-XL) 10 MG 24 hr tablet Take 10 mg by mouth daily.    . pantoprazole (PROTONIX) 40 MG tablet Take 1 tablet (40 mg total) by mouth daily. 90 tablet 3  . traMADol (ULTRAM) 50 MG tablet Take 1 tablet (50 mg total) by mouth every 6 (six) hours as needed for moderate pain. 30 tablet   .  Vitamin D, Ergocalciferol, (DRISDOL) 1.25 MG (50000 UT) CAPS capsule Take 50,000 Units by mouth every 14 (fourteen) days.     No current facility-administered medications for this visit.     ASSESSMENT & PLAN:   Assessment: 1. Essential thrombocythemia, well controlled with hydroxyurea 500 mg once daily.  Hydroxyurea was placed on hold in October 2021.  The platelets remain low normal, so Dr. Gilman Buttner recommends continuing to hold this.  We discussed the possibilities that he bone marrow may be evolving into myelofibrosis or leukemia.  I do not feel a bone marrow is needed at this time.  2. Leukocytosis/neutrophilia, which she has had on and off since  2018.  This is felt to be due to her myeloproliferative disease and is stable since discontinuation of hydroxyurea.  We will continue to monitor this.  There is no evidence of transformation to acute myelogenous leukemia on the peripheral blood.  3. Macrocytic anemia, secondary to hydroxyurea, stable.   4. History of vulvar cancer, treated with surgical excision.  5. Right upper lobe nodule on CT chest seen last year.  Pulmonology is following this and recent CT chest was stable.  6. History of melanoma of the great toe, treated with surgical excision.  She remains without evidence of recurrence  7. Chronic back pain, which is being managed with narcotics by Dr. Ollen Bowl.   8. Status post CABG x4 in March 2021.  9. Status post CVA x2 in March and April.  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  10. New calcifications in the right breast in March 2021 warranting further evaluation.  Unilateral diagnostic mammogram from July characterized these as likely benign. She will be due for bilateral diagnostic mammogram in late February.  11. Apparent iron deficiency, for which she has been on oral supplementation.  Her hemoglobin remains stable.   12. Chronic kidney disease, which has been stable.    Plan: Her blood counts remain fairly stable with only a mild decrease in her platelet count.  Hydroxyurea has been on hold since October, and I discussed that she may not need to resume this medication.  She knows to continue to hold hydroxyurea 500 mg daily until otherwise instucted.  If her blood counts change drastically, then we may need to pursue bone marrow examination as she could evolve into myelofibrosis or leukemia.  She will return in 4 weeks for CBC.  We will plan to see her back in 8 weeks with CBC, CMP and bilateral mammogram for continued observation.  She continues to decline the COVID-19 vaccine  The patient understands the plans  discussed today and is in agreement with them.  She knows to contact our office if she develops concerns regarding her myeloproliferative disease.   Dellia Beckwith, MD [facility] AT [facility] [address] [place] [address] Dept: [phone] Dept Fax: [phone]   I, Foye Deer, am acting as scribe for Dellia Beckwith, MD  I have reviewed this report as typed by the medical scribe, and it is complete and accurate.\

## 2020-08-23 ENCOUNTER — Inpatient Hospital Stay (INDEPENDENT_AMBULATORY_CARE_PROVIDER_SITE_OTHER): Payer: PPO | Admitting: Oncology

## 2020-08-23 ENCOUNTER — Other Ambulatory Visit: Payer: Self-pay | Admitting: Hematology and Oncology

## 2020-08-23 ENCOUNTER — Inpatient Hospital Stay: Payer: PPO | Attending: Hematology and Oncology

## 2020-08-23 ENCOUNTER — Other Ambulatory Visit: Payer: Self-pay

## 2020-08-23 ENCOUNTER — Other Ambulatory Visit: Payer: Self-pay | Admitting: Oncology

## 2020-08-23 ENCOUNTER — Encounter: Payer: Self-pay | Admitting: Oncology

## 2020-08-23 VITALS — BP 166/79 | HR 65 | Temp 98.2°F | Resp 18 | Ht 63.5 in | Wt 159.7 lb

## 2020-08-23 DIAGNOSIS — Z6825 Body mass index (BMI) 25.0-25.9, adult: Secondary | ICD-10-CM | POA: Diagnosis not present

## 2020-08-23 DIAGNOSIS — D473 Essential (hemorrhagic) thrombocythemia: Secondary | ICD-10-CM

## 2020-08-23 DIAGNOSIS — M545 Low back pain, unspecified: Secondary | ICD-10-CM | POA: Diagnosis not present

## 2020-08-23 DIAGNOSIS — Z0001 Encounter for general adult medical examination with abnormal findings: Secondary | ICD-10-CM | POA: Diagnosis not present

## 2020-08-23 DIAGNOSIS — R921 Mammographic calcification found on diagnostic imaging of breast: Secondary | ICD-10-CM

## 2020-08-23 DIAGNOSIS — E663 Overweight: Secondary | ICD-10-CM | POA: Diagnosis not present

## 2020-08-23 DIAGNOSIS — D709 Neutropenia, unspecified: Secondary | ICD-10-CM | POA: Diagnosis not present

## 2020-08-23 DIAGNOSIS — E559 Vitamin D deficiency, unspecified: Secondary | ICD-10-CM | POA: Diagnosis not present

## 2020-08-23 DIAGNOSIS — R42 Dizziness and giddiness: Secondary | ICD-10-CM | POA: Diagnosis not present

## 2020-08-23 DIAGNOSIS — R600 Localized edema: Secondary | ICD-10-CM | POA: Diagnosis not present

## 2020-08-23 DIAGNOSIS — K219 Gastro-esophageal reflux disease without esophagitis: Secondary | ICD-10-CM | POA: Diagnosis not present

## 2020-08-23 DIAGNOSIS — I10 Essential (primary) hypertension: Secondary | ICD-10-CM | POA: Diagnosis not present

## 2020-08-23 LAB — CBC AND DIFFERENTIAL
HCT: 37 (ref 36–46)
Hemoglobin: 11.8 — AB (ref 12.0–16.0)
Neutrophils Absolute: 11.31
Platelets: 136 — AB (ref 150–399)
WBC: 17.4

## 2020-08-23 LAB — CBC: RBC: 3.61 — AB (ref 3.87–5.11)

## 2020-09-04 ENCOUNTER — Ambulatory Visit (INDEPENDENT_AMBULATORY_CARE_PROVIDER_SITE_OTHER): Payer: PPO

## 2020-09-04 DIAGNOSIS — I6349 Cerebral infarction due to embolism of other cerebral artery: Secondary | ICD-10-CM

## 2020-09-07 LAB — CUP PACEART REMOTE DEVICE CHECK
Date Time Interrogation Session: 20220115230546
Implantable Pulse Generator Implant Date: 20210413

## 2020-09-15 DIAGNOSIS — J45909 Unspecified asthma, uncomplicated: Secondary | ICD-10-CM | POA: Insufficient documentation

## 2020-09-15 DIAGNOSIS — R0609 Other forms of dyspnea: Secondary | ICD-10-CM | POA: Insufficient documentation

## 2020-09-15 DIAGNOSIS — I10 Essential (primary) hypertension: Secondary | ICD-10-CM | POA: Insufficient documentation

## 2020-09-15 DIAGNOSIS — D75839 Thrombocytosis, unspecified: Secondary | ICD-10-CM | POA: Insufficient documentation

## 2020-09-15 DIAGNOSIS — Z8679 Personal history of other diseases of the circulatory system: Secondary | ICD-10-CM | POA: Insufficient documentation

## 2020-09-15 DIAGNOSIS — I2510 Atherosclerotic heart disease of native coronary artery without angina pectoris: Secondary | ICD-10-CM | POA: Insufficient documentation

## 2020-09-15 DIAGNOSIS — R600 Localized edema: Secondary | ICD-10-CM | POA: Insufficient documentation

## 2020-09-15 DIAGNOSIS — I509 Heart failure, unspecified: Secondary | ICD-10-CM | POA: Insufficient documentation

## 2020-09-15 DIAGNOSIS — R0600 Dyspnea, unspecified: Secondary | ICD-10-CM | POA: Insufficient documentation

## 2020-09-15 DIAGNOSIS — F419 Anxiety disorder, unspecified: Secondary | ICD-10-CM | POA: Insufficient documentation

## 2020-09-15 DIAGNOSIS — T8859XA Other complications of anesthesia, initial encounter: Secondary | ICD-10-CM | POA: Insufficient documentation

## 2020-09-15 DIAGNOSIS — M1990 Unspecified osteoarthritis, unspecified site: Secondary | ICD-10-CM | POA: Insufficient documentation

## 2020-09-15 DIAGNOSIS — Z9889 Other specified postprocedural states: Secondary | ICD-10-CM | POA: Insufficient documentation

## 2020-09-15 DIAGNOSIS — Z973 Presence of spectacles and contact lenses: Secondary | ICD-10-CM | POA: Insufficient documentation

## 2020-09-15 DIAGNOSIS — Z859 Personal history of malignant neoplasm, unspecified: Secondary | ICD-10-CM | POA: Insufficient documentation

## 2020-09-15 DIAGNOSIS — Z972 Presence of dental prosthetic device (complete) (partial): Secondary | ICD-10-CM | POA: Insufficient documentation

## 2020-09-15 DIAGNOSIS — G4733 Obstructive sleep apnea (adult) (pediatric): Secondary | ICD-10-CM | POA: Insufficient documentation

## 2020-09-19 DIAGNOSIS — L570 Actinic keratosis: Secondary | ICD-10-CM | POA: Diagnosis not present

## 2020-09-19 NOTE — Progress Notes (Signed)
Carelink Summary Report / Loop Recorder 

## 2020-09-20 ENCOUNTER — Other Ambulatory Visit: Payer: Self-pay

## 2020-09-20 ENCOUNTER — Other Ambulatory Visit: Payer: Self-pay | Admitting: Hematology and Oncology

## 2020-09-20 ENCOUNTER — Inpatient Hospital Stay: Payer: PPO | Attending: Hematology and Oncology

## 2020-09-20 DIAGNOSIS — D473 Essential (hemorrhagic) thrombocythemia: Secondary | ICD-10-CM | POA: Diagnosis not present

## 2020-09-20 LAB — CBC AND DIFFERENTIAL
HCT: 36 (ref 36–46)
Hemoglobin: 11.7 — AB (ref 12.0–16.0)
Neutrophils Absolute: 11.45
Platelets: 127 — AB (ref 150–399)
WBC: 15.9

## 2020-09-20 LAB — CBC
MCV: 100 (ref 76–111)
RBC: 3.63 — AB (ref 3.87–5.11)

## 2020-09-21 ENCOUNTER — Telehealth: Payer: Self-pay

## 2020-09-21 NOTE — Telephone Encounter (Signed)
-----   Message from Christine H McCarty, MD sent at 09/20/2020  6:58 PM EST ----- Regarding: call pt Tell her CBC looks good.  WBC a little better at 15.9, hgb stable at 11.7, platelets sl.lower at 127, normal is 130 or above.  I think she is evolving into myelofibrosis, which can happen sometimes, and means she won't need the Hydrea any more.  This just means scarring in the bone marrow and just needs to be monitored, usually doesn't require treatment.   I don't see signs of leukemia  

## 2020-09-22 ENCOUNTER — Encounter: Payer: Self-pay | Admitting: Cardiology

## 2020-09-22 ENCOUNTER — Ambulatory Visit: Payer: PPO | Admitting: Cardiology

## 2020-09-22 ENCOUNTER — Other Ambulatory Visit: Payer: Self-pay

## 2020-09-22 VITALS — BP 142/72 | HR 63 | Ht 65.0 in | Wt 164.2 lb

## 2020-09-22 DIAGNOSIS — I2510 Atherosclerotic heart disease of native coronary artery without angina pectoris: Secondary | ICD-10-CM | POA: Diagnosis not present

## 2020-09-22 DIAGNOSIS — E782 Mixed hyperlipidemia: Secondary | ICD-10-CM | POA: Diagnosis not present

## 2020-09-22 DIAGNOSIS — Z951 Presence of aortocoronary bypass graft: Secondary | ICD-10-CM | POA: Diagnosis not present

## 2020-09-22 DIAGNOSIS — R0609 Other forms of dyspnea: Secondary | ICD-10-CM

## 2020-09-22 DIAGNOSIS — R0600 Dyspnea, unspecified: Secondary | ICD-10-CM

## 2020-09-22 NOTE — Patient Instructions (Addendum)
Medication Instructions:  No medication changes. *If you need a refill on your cardiac medications before your next appointment, please call your pharmacy*   Lab Work: Your physician recommends that you return for lab work in: next few days. You need to have labs done when you are fasting.  You can come Monday through Friday 8:30 am to 12:00 pm and 1:15 to 4:30. You do not need to make an appointment as the order has already been placed. The labs you are going to have done are BMET, CBC, TSH, LFT and Lipids.  If you have labs (blood work) drawn today and your tests are completely normal, you will receive your results only by: . MyChart Message (if you have MyChart) OR . A paper copy in the mail If you have any lab test that is abnormal or we need to change your treatment, we will call you to review the results.   Testing/Procedures: None ordered   Follow-Up: At [facility], you and your health needs are our priority.  As part of our continuing mission to provide you with exceptional heart care, we have created designated Provider Care Teams.  These Care Teams include your primary Cardiologist (physician) and Advanced Practice Providers (APPs -  Physician Assistants and Nurse Practitioners) who all work together to provide you with the care you need, when you need it.  We recommend signing up for the patient portal called "MyChart".  Sign up information is provided on this After Visit Summary.  MyChart is used to connect with patients for Virtual Visits (Telemedicine).  Patients are able to view lab/test results, encounter notes, upcoming appointments, etc.  Non-urgent messages can be sent to your provider as well.   To learn more about what you can do with MyChart, go to [email or web address].    Your next appointment:   6 month(s)  The format for your next appointment:   In Person  Provider:   Rajan Revankar, MD   Other Instructions NA  

## 2020-09-22 NOTE — Progress Notes (Signed)
Cardiology Office Note:    Date:  09/22/2020   ID:  Yolanda Flowers, DOB August 04, 1941, MRN [id number]  PCP:  [name], MD  Cardiologist:  Jenean Lindau, MD   Referring MD: [name], MD    ASSESSMENT:    1. Coronary artery disease involving native coronary artery of native heart without angina pectoris   2. Dyspnea on exertion   3. S/P CABG x 4   4. Combined hyperlipidemia    PLAN:    In order of problems listed above:  1. Coronary artery disease post CABG surgery: Secondary prevention stressed with the patient.  Importance of compliance with diet medication stressed and she vocalized understanding.  I told her to walk at least 5 days a week half an hour a day and she promises to do so. 2. Essential hypertension: Blood pressure stable and diet was emphasized.  Lifestyle modification was urged. 3. Mixed dyslipidemia: I reviewed lipids with her.  Last KPN sheet revealed elevated triglycerides so she will have blood work in the next few days.  She is coming fasting and will do entire blood work. 4. History of thromboembolism not on antiplatelet agent because of marked blood loss anemia in the past.  Patient is not keen on any of these medications like antiplatelet agent and I respect her wishes.  She has been told in the past according to her never to take any of these. 5. Patient will be seen in follow-up appointment in 6 months or earlier if the patient has any concerns   Medication Adjustments/Labs and Tests Ordered: Current medicines are reviewed at length with the patient today.  Concerns regarding medicines are outlined above.  No orders of the defined types were placed in this encounter.  No orders of the defined types were placed in this encounter.    No chief complaint on file.    History of Present Illness:    Yolanda Flowers is a 80 y.o. female.  Patient has past medical history of coronary artery disease post CABG surgery, essential hypertension and  dyslipidemia.  She has history of blood loss anemia.  She mentions to me that therefore she was told never to be on aspirin or such medications.  She has had a thromboembolic stroke.  She denies any problems at this time and takes care of activities of daily living.  No chest pain orthopnea or PND.  At the time of my evaluation, the patient is alert awake oriented and in no distress.  Past Medical History:  Diagnosis Date  . Abdominal pain   . Accelerating angina ([place]) 08/26/2019  . Acute blood loss anemia 12/14/2019  . Allergic rhinitis 06/21/2014  . Anxiety   . Arthritis   . Asthma   . Atherosclerosis of arteries 08/24/2019  . Bilateral edema of lower extremity   . Body mass index 29.0-29.9, adult 08/24/2019  . CAD (coronary artery disease) 10/25/2019  . Cellulitis of right leg   . Combined hyperlipidemia 08/24/2019  . Complication of anesthesia    difficulty waking  . Congestive heart failure (CHF) ([place])   . COPD (chronic obstructive pulmonary disease) ([place])   . Coronary artery disease   . Dyspnea 02/09/2019  . Dyspnea on exertion   . Embolic stroke ([place]) 04/19/5175  . Essential thrombocythemia ([place]) 06/22/2014  . Essential thrombocytosis ([place]) 06/22/2014  . Gastrointestinal bleeding 06/23/2018  . GERD (gastroesophageal reflux disease)   . History of melanoma excision    left greast toe 2015  .  History of squamous cell carcinoma excision    face--  multiple excisions  . History of subdural hematoma    2008  . HTN (hypertension) 06/21/2014  . Hypertension   . Ischial bursitis, right 08/24/2019  . Leucocytosis 12/14/2019  . Malignant melanoma of great toe ([place]) 08/24/2019  . Melanoma in situ ([place]) 06/29/2014  . OSA (obstructive sleep apnea)    intolerant  . Pedal edema 08/24/2019  . Renal insufficiency 08/24/2019  . S/P CABG x 4 11/04/2019  . Skin cancer 08/24/2019  . Solitary pulmonary nodule on lung CT 02/09/2019  . Squamous cell carcinoma, scalp/neck 08/24/2019  . Stroke due to embolism ([place])  11/25/2019  . Swelling of limb 08/24/2019  . Thrombocytosis    takes hydoxyurea--  MONITORED BY DR Hinton [facility])  . VIN III (vulvar intraepithelial neoplasia III)   . Vitamin B 12 deficiency 08/24/2019  . Vitamin D deficiency 08/24/2019  . Wears dentures    UPPER AND LOWER PARTIAL  . Wears glasses     Past Surgical History:  Procedure Laterality Date  . ABDOMINAL HYSTERECTOMY  age 48  . CARDIAC CATHETERIZATION  03/172021  . CORONARY ARTERY BYPASS GRAFT N/A 11/04/2019   Procedure: CORONARY ARTERY BYPASS GRAFTING (CABG) x 4, with ENDOSCOPIC HARVESTING OF RIGHT GREATER SAPHENOUS VEIN.;  Surgeon: Gaye Pollack, MD;  Location: [place] [facility] OR;  Service: Open Heart Surgery;  Laterality: N/A;  . INTRAVASCULAR ULTRASOUND/IVUS N/A 11/03/2019   Procedure: Intravascular Ultrasound/IVUS;  Surgeon: Nelva Bush, MD;  Location: [facility];  Service: Cardiovascular;  Laterality: N/A;  . KNEE ARTHROSCOPY Left 2004  . LEFT HEART CATH AND CORONARY ANGIOGRAPHY N/A 11/03/2019   Procedure: LEFT HEART CATH AND CORONARY ANGIOGRAPHY;  Surgeon: Nelva Bush, MD;  Location: [facility];  Service: Cardiovascular;  Laterality: N/A;  . LOOP RECORDER INSERTION N/A 11/30/2019   Procedure: LOOP RECORDER INSERTION;  Surgeon: Thompson Grayer, MD;  Location: [facility];  Service: Cardiovascular;  Laterality: N/A;  . MELANOMA EXCISION  2015   left great toe  . REPAIR PERONEAL TENDONS ANKLE  2004  . SUBDURAL HEMATOMA EVACUATION VIA CRANIOTOMY  2008      week later  post-op  [facility]  . TEE WITHOUT CARDIOVERSION N/A 11/04/2019   Procedure: TRANSESOPHAGEAL ECHOCARDIOGRAM (TEE);  Surgeon: Gaye Pollack, MD;  Location: [facility];  Service: Open Heart Surgery;  Laterality: N/A;  . VULVECTOMY N/A 10/24/2015   Procedure: WIDE LOCAL EXCISION OF THE VULVA ;  Surgeon: Everitt Amber, MD;  Location: [facility];  Service: Gynecology;  Laterality: N/A;    Current Medications: Current  Meds  Medication Sig  . acetaminophen (TYLENOL) 325 MG tablet Take 1-2 tablets (325-650 mg total) by mouth every 4 (four) hours as needed for mild pain.  . Ascorbic Acid (VITAMIN C) 1000 MG tablet Take 1,000 mg by mouth daily.  Marland Kitchen atorvastatin (LIPITOR) 40 MG tablet Take 1 tablet (40 mg total) by mouth daily at 6 PM.  . Calcium Carb-Cholecalciferol (CALCIUM 600 + D PO) Take 1 tablet by mouth in the morning and at bedtime.   . clopidogrel (PLAVIX) 75 MG tablet Take 1 tablet (75 mg total) by mouth daily.  . fluticasone (FLONASE) 50 MCG/ACT nasal spray Place 2 sprays into both nostrils at bedtime.   . Magnesium 250 MG TABS Take 250 mg by mouth at bedtime.   . metoprolol tartrate (LOPRESSOR) 25 MG tablet Take 1 tablet (25 mg total) by mouth 2 (  two) times daily.  . Multiple Vitamins-Minerals (PRESERVISION AREDS 2) CAPS Take 1 capsule by mouth in the morning and at bedtime.   . pantoprazole (PROTONIX) 40 MG tablet Take 1 tablet (40 mg total) by mouth daily.  . traMADol (ULTRAM) 50 MG tablet Take 1 tablet (50 mg total) by mouth every 6 (six) hours as needed for moderate pain.  . Vitamin D, Ergocalciferol, (DRISDOL) 1.25 MG (50000 UT) CAPS capsule Take 50,000 Units by mouth every 14 (fourteen) days.     Allergies:   Cephalexin, Ciprofloxacin, and Penicillins   Social History   Socioeconomic History  . Marital status: Married    Spouse name: Not on file  . Number of children: Not on file  . Years of education: Not on file  . Highest education level: Not on file  Occupational History  . Not on file  Tobacco Use  . Smoking status: Never Smoker  . Smokeless tobacco: Never Used  Vaping Use  . Vaping Use: Never used  Substance and Sexual Activity  . Alcohol use: No  . Drug use: No  . Sexual activity: Not Currently  Other Topics Concern  . Not on file  Social History Narrative  . Not on file   Social Determinants of Health   Financial Resource Strain: Not on file  Food Insecurity: Not on  file  Transportation Needs: Not on file  Physical Activity: Not on file  Stress: Not on file  Social Connections: Not on file     Family History: The patient's family history includes Arthritis in her brother, father, mother, and sister; Asthma in her father; Clotting disorder in her mother; Emphysema in her father; Heart disease in her brother, father, mother, and sister; Hypertension in her brother, father, mother, and sister; Kidney failure in her father; Stroke in her father.  ROS:   Please see the history of present illness.    All other systems reviewed and are negative.  EKGs/Labs/Other Studies Reviewed:    The following studies were reviewed today: I discussed my findings with the patient at length   Recent Labs: 11/05/2019: Magnesium 2.4 04/21/2020: TSH 3.330 05/26/2020: ALT 16; BUN 17; Creatinine 1.1; Potassium 4.4; Sodium 142 09/20/2020: Hemoglobin 11.7; Platelets 127  Recent Lipid Panel    Component Value Date/Time   CHOL 155 04/21/2020 0846   TRIG 201 (H) 04/21/2020 0846   HDL 39 (L) 04/21/2020 0846   CHOLHDL 4.0 04/21/2020 0846   CHOLHDL 2.8 11/25/2019 2139   VLDL 21 11/25/2019 2139   LDLCALC 82 04/21/2020 0846    Physical Exam:    VS:  BP (!) 142/72   Pulse 63   Ht 5\' 5"  (1.651 m)   Wt 164 lb 3.2 oz (74.5 kg)   SpO2 93%   BMI 27.32 kg/m     Wt Readings from Last 3 Encounters:  09/22/20 164 lb 3.2 oz (74.5 kg)  08/23/20 159 lb 11.2 oz (72.4 kg)  07/05/20 158 lb 1.6 oz (71.7 kg)     GEN: Patient is in no acute distress HEENT: Normal NECK: No JVD; No carotid bruits LYMPHATICS: No lymphadenopathy CARDIAC: Hear sounds regular, 2/6 systolic murmur at the apex. RESPIRATORY:  Clear to auscultation without rales, wheezing or rhonchi  ABDOMEN: Soft, non-tender, non-distended MUSCULOSKELETAL:  No edema; No deformity  SKIN: Warm and dry NEUROLOGIC:  Alert and oriented x 3 PSYCHIATRIC:  Normal affect   Signed, Jenean Lindau, MD  09/22/2020 1:18 PM     [facility]

## 2020-09-25 ENCOUNTER — Telehealth: Payer: Self-pay

## 2020-09-25 NOTE — Telephone Encounter (Signed)
-----   Message from Derwood Kaplan, MD sent at 09/20/2020  6:58 PM EST ----- Regarding: call pt Tell her CBC looks good.  WBC a little better at 15.9, hgb stable at 11.7, platelets sl.lower at 127, normal is 130 or above.  I think she is evolving into myelofibrosis, which can happen sometimes, and means she won't need the Hydrea any more.  This just means scarring in the bone marrow and just needs to be monitored, usually doesn't require treatment.   I don't see signs of leukemia

## 2020-09-26 DIAGNOSIS — J309 Allergic rhinitis, unspecified: Secondary | ICD-10-CM | POA: Diagnosis not present

## 2020-09-26 DIAGNOSIS — M7989 Other specified soft tissue disorders: Secondary | ICD-10-CM | POA: Diagnosis not present

## 2020-09-26 DIAGNOSIS — N289 Disorder of kidney and ureter, unspecified: Secondary | ICD-10-CM | POA: Diagnosis not present

## 2020-09-26 DIAGNOSIS — E663 Overweight: Secondary | ICD-10-CM | POA: Diagnosis not present

## 2020-09-26 DIAGNOSIS — J45909 Unspecified asthma, uncomplicated: Secondary | ICD-10-CM | POA: Diagnosis not present

## 2020-09-26 DIAGNOSIS — E782 Mixed hyperlipidemia: Secondary | ICD-10-CM | POA: Diagnosis not present

## 2020-09-26 DIAGNOSIS — Z6825 Body mass index (BMI) 25.0-25.9, adult: Secondary | ICD-10-CM | POA: Diagnosis not present

## 2020-09-26 DIAGNOSIS — N3281 Overactive bladder: Secondary | ICD-10-CM | POA: Diagnosis not present

## 2020-09-26 DIAGNOSIS — Z951 Presence of aortocoronary bypass graft: Secondary | ICD-10-CM | POA: Diagnosis not present

## 2020-09-26 DIAGNOSIS — R0600 Dyspnea, unspecified: Secondary | ICD-10-CM | POA: Diagnosis not present

## 2020-09-26 DIAGNOSIS — M545 Low back pain, unspecified: Secondary | ICD-10-CM | POA: Diagnosis not present

## 2020-09-26 DIAGNOSIS — I10 Essential (primary) hypertension: Secondary | ICD-10-CM | POA: Diagnosis not present

## 2020-09-26 DIAGNOSIS — I2510 Atherosclerotic heart disease of native coronary artery without angina pectoris: Secondary | ICD-10-CM | POA: Diagnosis not present

## 2020-09-27 ENCOUNTER — Telehealth: Payer: Self-pay

## 2020-09-27 LAB — CBC WITH DIFFERENTIAL/PLATELET
Basophils Absolute: 0.6 10*3/uL — ABNORMAL HIGH (ref 0.0–0.2)
Basos: 3 %
EOS (ABSOLUTE): 0.2 10*3/uL (ref 0.0–0.4)
Eos: 1 %
Hematocrit: 37.7 % (ref 34.0–46.6)
Hemoglobin: 12.3 g/dL (ref 11.1–15.9)
Lymphocytes Absolute: 3.5 10*3/uL — ABNORMAL HIGH (ref 0.7–3.1)
Lymphs: 18 %
MCH: 32.4 pg (ref 26.6–33.0)
MCHC: 32.6 g/dL (ref 31.5–35.7)
MCV: 99 fL — ABNORMAL HIGH (ref 79–97)
Monocytes Absolute: 2.1 10*3/uL — ABNORMAL HIGH (ref 0.1–0.9)
Monocytes: 11 %
NRBC: 1 % — ABNORMAL HIGH (ref 0–0)
Neutrophils Absolute: 12.4 10*3/uL — ABNORMAL HIGH (ref 1.4–7.0)
Neutrophils: 63 %
Platelets: 147 10*3/uL — ABNORMAL LOW (ref 150–450)
RBC: 3.8 x10E6/uL (ref 3.77–5.28)
RDW: 14.7 % (ref 11.7–15.4)
WBC: 19.3 10*3/uL — ABNORMAL HIGH (ref 3.4–10.8)

## 2020-09-27 LAB — HEPATIC FUNCTION PANEL
ALT: 19 IU/L (ref 0–32)
AST: 23 IU/L (ref 0–40)
Albumin: 4.7 g/dL (ref 3.7–4.7)
Alkaline Phosphatase: 85 IU/L (ref 44–121)
Bilirubin Total: 0.6 mg/dL (ref 0.0–1.2)
Bilirubin, Direct: 0.15 mg/dL (ref 0.00–0.40)
Total Protein: 7.9 g/dL (ref 6.0–8.5)

## 2020-09-27 LAB — BASIC METABOLIC PANEL
BUN/Creatinine Ratio: 15 (ref 12–28)
BUN: 17 mg/dL (ref 8–27)
CO2: 25 mmol/L (ref 20–29)
Calcium: 9.7 mg/dL (ref 8.7–10.3)
Chloride: 104 mmol/L (ref 96–106)
Creatinine, Ser: 1.13 mg/dL — ABNORMAL HIGH (ref 0.57–1.00)
GFR calc Af Amer: 53 mL/min/{1.73_m2} — ABNORMAL LOW (ref >59)
GFR calc non Af Amer: 46 mL/min/{1.73_m2} — ABNORMAL LOW (ref >59)
Glucose: 94 mg/dL (ref 65–99)
Potassium: 4.5 mmol/L (ref 3.5–5.2)
Sodium: 142 mmol/L (ref 134–144)

## 2020-09-27 LAB — LIPID PANEL
Chol/HDL Ratio: 3.2 ratio (ref 0.0–4.4)
Cholesterol, Total: 105 mg/dL (ref 100–199)
HDL: 33 mg/dL — ABNORMAL LOW (ref >39)
LDL Chol Calc (NIH): 41 mg/dL (ref 0–99)
Triglycerides: 188 mg/dL — ABNORMAL HIGH (ref 0–149)
VLDL Cholesterol Cal: 31 mg/dL (ref 5–40)

## 2020-09-27 LAB — IMMATURE CELLS
Bands(Auto) Relative: 1 %
Metamyelocytes: 3 % — ABNORMAL HIGH (ref 0–0)

## 2020-09-27 LAB — TSH: TSH: 3.5 u[IU]/mL (ref 0.450–4.500)

## 2020-09-27 NOTE — Telephone Encounter (Signed)
-----   Message from Derwood Kaplan, MD sent at 09/20/2020  6:58 PM EST ----- Regarding: call pt Tell her CBC looks good.  WBC a little better at 15.9, hgb stable at 11.7, platelets sl.lower at 127, normal is 130 or above.  I think she is evolving into myelofibrosis, which can happen sometimes, and means she won't need the Hydrea any more.  This just means scarring in the bone marrow and just needs to be monitored, usually doesn't require treatment.   I don't see signs of leukemia

## 2020-10-09 ENCOUNTER — Ambulatory Visit (INDEPENDENT_AMBULATORY_CARE_PROVIDER_SITE_OTHER): Payer: PPO

## 2020-10-09 DIAGNOSIS — I6349 Cerebral infarction due to embolism of other cerebral artery: Secondary | ICD-10-CM

## 2020-10-11 LAB — CUP PACEART REMOTE DEVICE CHECK
Date Time Interrogation Session: 20220217230440
Implantable Pulse Generator Implant Date: 20210413

## 2020-10-17 NOTE — Progress Notes (Signed)
Carelink Summary Report / Loop Recorder 

## 2020-10-18 ENCOUNTER — Other Ambulatory Visit: Payer: Self-pay

## 2020-10-18 ENCOUNTER — Inpatient Hospital Stay: Payer: PPO | Attending: Hematology and Oncology

## 2020-10-18 ENCOUNTER — Inpatient Hospital Stay: Payer: PPO | Admitting: Hematology and Oncology

## 2020-10-18 ENCOUNTER — Telehealth: Payer: Self-pay | Admitting: Hematology and Oncology

## 2020-10-18 ENCOUNTER — Other Ambulatory Visit: Payer: Self-pay | Admitting: Hematology and Oncology

## 2020-10-18 ENCOUNTER — Other Ambulatory Visit: Payer: Self-pay | Admitting: Cardiology

## 2020-10-18 ENCOUNTER — Encounter: Payer: Self-pay | Admitting: Hematology and Oncology

## 2020-10-18 VITALS — BP 139/93 | HR 62 | Temp 98.6°F | Resp 18 | Ht 65.0 in | Wt 163.5 lb

## 2020-10-18 DIAGNOSIS — D473 Essential (hemorrhagic) thrombocythemia: Secondary | ICD-10-CM | POA: Diagnosis not present

## 2020-10-18 LAB — BASIC METABOLIC PANEL
BUN: 21 (ref 4–21)
CO2: 27 — AB (ref 13–22)
Chloride: 104 (ref 99–108)
Creatinine: 1.2 — AB (ref 0.5–1.1)
Glucose: 102
Potassium: 4.2 (ref 3.4–5.3)
Sodium: 141 (ref 137–147)

## 2020-10-18 LAB — HEPATIC FUNCTION PANEL
ALT: 18 (ref 7–35)
AST: 31 (ref 13–35)
Alkaline Phosphatase: 83 (ref 25–125)
Bilirubin, Total: 0.7

## 2020-10-18 LAB — CBC: RBC: 3.66 — AB (ref 3.87–5.11)

## 2020-10-18 LAB — COMPREHENSIVE METABOLIC PANEL
Albumin: 4.7 (ref 3.5–5.0)
Calcium: 8.8 (ref 8.7–10.7)

## 2020-10-18 LAB — CBC AND DIFFERENTIAL
HCT: 36 (ref 36–46)
Hemoglobin: 11.8 — AB (ref 12.0–16.0)
Neutrophils Absolute: 13.17
Platelets: 135 — AB (ref 150–399)
WBC: 17.8

## 2020-10-18 NOTE — Telephone Encounter (Signed)
Per 3/2 los next appt sched and given to patient

## 2020-10-18 NOTE — Progress Notes (Signed)
Yolanda Flowers  [address] [place],  [place]  [address] [phone]  Clinic Day:  10/18/2020  Referring physician: [name], MD   CHIEF COMPLAINT:  CC:   Essential thrombocytosis with fatigue  Current Treatment:   Observation   HISTORY OF PRESENT ILLNESS:  Yolanda Flowers is a 80 y.o. female with a history of essential thrombocythemia originally diagnosed in 103.  She was treated with hydroxyurea 500 mg 3 times daily.  We have had to occasionally adjust doses because of leukopenia or anemia.  She also had a malignant melanoma of her left first toe resected in November 2015 with partial amputation of the toe and has done well.  She has had multiple other skin cancers removed over the years.  In March 2017, she was seen by Dr. Denman George for HSIL/VIN 3 of the right posterior labia minora and underwent vulvectomy.  Hydroxyurea was decreased to 500 mg twice daily prior to surgery, as we did not feel we could safely hold hydroxyurea.  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  The patient had been seeing the gynecologist every 6 months, but has not continued regular follow-ups there. The platelet count increased in May 2017 to 657,000, so her dose of hydroxyurea was increased to 3 times daily, then subsequently decreased to twice a day due to leukopenia and anemia.  Her platelets had remained between under 500,000 since August 2018.  Bone density scan in March 2018 revealed osteopenia with a T-score of -1.6 in the femur, for which she is on calcium and vitamin-D.  She has had chronic back pain, which is attributed to severe degenerative disease.  She had an MRI of the lumbar spine in March 2018, which revealed significant degenerative disc disease with mild spinal stenosis at L1 and L4, but moderate at L2-3.  Dr. Maryjean Ka has given her injections, as well at hydrocodone/APAP 5/325.   She has had intermittent leukocytosis since 2018, felt to be secondary to her essential thrombocythemia.  She was evaluated in the emergency department in October 2019 and had a GI bleed. CT abdomen and pelvis revealed acute uncomplicated diverticulitis of the sigmoid colon with no other acute abnormalities.  A moderate to large hiatal hernia was seen.  No focal hepatic lesions or intrahepatic biliary dilatation were seen.  She saw Dr. Geraldo Pitter and underwent nuclear med stress test in November, which did not reveal any evidence of inducible ischemia or other abnormality.  Left ventricular size and function was normal with an ejection fraction of 79%.  She states she was placed on Spiriva and Ventolin.  She underwent EGD and colonoscopy in December  2019, and no significant source of blood loss was identified.  Repeat iron studies, B12 and folate did not reveal any nutritional deficiency  She was instructed to continue lansoprazole.  She had dilatation of an esophageal stricture and removal of a polyp. Pathology revealed a fragmented tubular adenoma.   In January 2020, she had worsening anemia, in addition to continued episodes of chest pain and worsening dyspnea with exertion at her routine follow-up. Due to the severe dyspnea and right lower extremity edema, we obtained a CT angiogram chest and venous Doppler ultrasound of the right lower extremity. CTA chest did not reveal any evidence of pulmonary embolism.  There was good opacification of the pulmonary arteries.  Diffuse coronary artery calcifications with cardiomegaly and moderate thoracic aortic atherosclerosis was seen.  There was a 3.6 mm noncalcified nodule in the anterior inferior right upper lobe.  Noncontrast CT chest in 6-12 months was recommended.  Right lower extremity venous Doppler ultrasound did not reveal any evidence of deep venous thrombosis.  Further evaluation of the anemia did not reveal evidence of hemolysis or monoclonal gammopathy.  In February 2020, she had persistent anemia with a hemoglobin of 9.7.  As she was symptomatic, hydroxyurea 500 mg was decreased to once daily.  Soluble transferrin was elevated, which was consistent with iron deficiency.  She saw Dr. Lyda Jester and he placed her on iron supplementation daily in the form of ferrous sulfate 65 mg. She was seen again in March and had improvement in her anemia with a hemoglobin of 10.2.  Her thrombocythemia remained controlled on hydroxyurea 500 mg daily. We encouraged her to have follow-up for her vulvar cancer, so followed up with Dr. Carlena Bjornstad and her exam was normal.   He did not feel he needed to check her again for 2 years unless she had problems.  Due to chronic dyspnea, she underwent multiple tests at [facility].  Apparently, she did not have evidence of COPD.  She was therefore referred to Cardiology.  She saw Dr. Geraldo Pitter and was undergoing cardiac evaluation when we saw her in January 2021.  She had a bilateral screening mammogram in March 2021, which revealed an area of calcifications in the right breast warranting further evaluation.  A diagnostic right mammogram was ordered, but this was delayed, because she underwent the open heart surgery with CABG x4 in March.  She had a monitor placed in the left chest wall the time of surgery.  She does not feel she can have mammogram due to the monitor in the left chest wall.  She then had 2 strokes following her surgery, from which she has recovered well, except for balance issues and left eye blindness.  She ambulates with a cane or walker depending on her circumstances.  She is on clopidogrel 75 mg daily, atorvastatin 80 mg daily, metoprolol 25 mg twice daily and pantoprazole 40 mg daily.  She had a venous Doppler ultrasound of the right lower extremity in March for swelling postoperatively which did not reveal any evidence of deep venous thrombosis.  She had a CT chest on July 6th to follow-up on the pulmonary nodule and  this was stable.  Unilateral right mammogram from July revealed a likely benign 6 mm group of calcifications involving the upper inner quadrant at middle depth.  She will be due for annual bilateral mammogram in February, but she has a loop recorder on the left side.  She will have the loop recorder for 4 years.  The mammography department told us she can have a mammogram with the loop recorder in place, so she underwent right diagnostic mammogram with spot compression views in July.  This revealed a 6 mm area of benign-appearing calcifications in the upper inner quadrant of the right breast.  Bilateral diagnostic mammogram in February with spot compression views of this area in the right breast was recommended.  At her visit in October 2021, she reported elevated transaminases and purpura, which was attributed to atorvastatin.  Dr. Geraldo Pitter reduced the dose from 80mg  to 40mg  with normalization of the transaminases and no further purpura.  Her platelets were in low normal range, so hydroxyurea was placed on hold in October.  She continued to decline the COVID-19 vaccine due to her medical comorbidities.   When she was seen  in January of this year, her platelet count remained in the low normal range.  She had persistent leukocytosis with few immature white blood cells, but no evidence of transformation to acute myelogenous leukemia. Closer follow-up was recommended.  Repeat CBC in February was fairly stable.  INTERVAL HISTORY:  Yolanda Flowers is here today for clinical reassessment. Since her last visit, she has been doing fairly well except for fatigue and continued back pain.  She is on tramadol without good relief of her pain.  This is managed by her primary care physician. She denies fevers or chills. Her appetite is good. Her weight has increased 5 pounds over last 1 month. She was scheduled for bilateral diagnostic mammogram with spot compression views of the right breast to follow-up on the calcifications seen  in March 2021.  She states she canceled that because she only wants to have a mammogram once a year.  I explained that the mammogram in July was only done on the right breast, so she is due for bilateral mammogram at this time.  She is not thrilled with the idea of mammogram, as she feels like she had more pain associated with the examination last year. She is willing to schedule this, but if she has difficulty with the examination she will stop it.  REVIEW OF SYSTEMS:  Review of Systems  Constitutional: Negative for appetite change, chills, fatigue, fever and unexpected weight change.  HENT:   Negative for lump/mass, mouth sores and sore throat.   Respiratory: Negative for cough and shortness of breath.   Cardiovascular: Negative for chest pain and leg swelling.  Gastrointestinal: Negative for abdominal pain, constipation, diarrhea, nausea and vomiting.  Genitourinary: Negative for difficulty urinating, dysuria, frequency and hematuria.   Musculoskeletal: Positive for back pain (chronic). Negative for arthralgias and myalgias.  Skin: Negative for rash.  Neurological: Negative for dizziness and headaches.  Hematological: Negative for adenopathy. Does not bruise/bleed easily.  Psychiatric/Behavioral: Positive for sleep disturbance (sleeps only 3-4 hours due to pain). Negative for depression. The patient is not nervous/anxious.      VITALS:  There were no vitals taken for this visit.  Wt Readings from Last 3 Encounters:  09/22/20 164 lb 3.2 oz (74.5 kg)  08/23/20 159 lb 11.2 oz (72.4 kg)  07/05/20 158 lb 1.6 oz (71.7 kg)    There is no height or weight on file to calculate BMI.  Performance status (ECOG): 1 - Symptomatic but completely ambulatory  PHYSICAL EXAM:  Physical Exam Vitals and nursing note reviewed.  Constitutional:      General: She is not in acute distress.    Appearance: Normal appearance. She is not toxic-appearing.  HENT:     Mouth/Throat:     Mouth: Mucous membranes  are moist.     Pharynx: Oropharynx is clear. No oropharyngeal exudate or posterior oropharyngeal erythema.  Eyes:     General: No scleral icterus.    Extraocular Movements: Extraocular movements intact.     Conjunctiva/sclera: Conjunctivae normal.     Pupils: Pupils are equal, round, and reactive to light.  Cardiovascular:     Rate and Rhythm: Normal rate and regular rhythm.     Heart sounds: Normal heart sounds. No murmur heard. No friction rub. No gallop.   Pulmonary:     Effort: Pulmonary effort is normal.     Breath sounds: Normal breath sounds. No wheezing, rhonchi or rales.  Chest:  Breasts:     Right: No axillary adenopathy or supraclavicular adenopathy.  Left: No axillary adenopathy or supraclavicular adenopathy.      Comments: Breast exam is deferred Abdominal:     General: There is no distension.     Palpations: Abdomen is soft. There is no hepatomegaly, splenomegaly or mass.     Tenderness: There is no abdominal tenderness.  Musculoskeletal:        General: Normal range of motion.     Cervical back: Normal range of motion and neck supple. No tenderness.     Right lower leg: No edema.     Left lower leg: No edema.  Lymphadenopathy:     Cervical: No cervical adenopathy.     Upper Body:     Right upper body: No supraclavicular or axillary adenopathy.     Left upper body: No supraclavicular or axillary adenopathy.     Lower Body: No right inguinal adenopathy. No left inguinal adenopathy.  Skin:    General: Skin is warm and dry.     Coloration: Skin is not jaundiced.     Findings: No rash.  Neurological:     Mental Status: She is alert and oriented to person, place, and time.     Cranial Nerves: No cranial nerve deficit.  Psychiatric:        Mood and Affect: Mood normal.        Behavior: Behavior normal.        Thought Content: Thought content normal.    LABS:   CBC Latest Ref Rng & Units 09/26/2020 09/20/2020 08/23/2020  WBC 3.4 - 10.8 x10E3/uL 19.3(H) 15.9 17.4   Hemoglobin 11.1 - 15.9 g/dL 12.3 11.7(A) 11.8(A)  Hematocrit 34.0 - 46.6 % 37.7 36 37  Platelets 150 - 450 x10E3/uL 147(L) 127(A) 136(A)   CMP Latest Ref Rng & Units 09/26/2020 05/26/2020 04/21/2020  Glucose 65 - 99 mg/dL 94 - 91  BUN 8 - 27 mg/dL 17 17 14   Creatinine 0.57 - 1.00 mg/dL 1.13(H) 1.1 1.10(H)  Sodium 134 - 144 mmol/L 142 142 142  Potassium 3.5 - 5.2 mmol/L 4.5 4.4 4.3  Chloride 96 - 106 mmol/L 104 104 103  CO2 20 - 29 mmol/L 25 27(A) 24  Calcium 8.7 - 10.3 mg/dL 9.7 9.2 9.1  Total Protein 6.0 - 8.5 g/dL 7.9 - 7.7  Total Bilirubin 0.0 - 1.2 mg/dL 0.6 - 0.6  Alkaline Phos 44 - 121 IU/L 85 77 84  AST 0 - 40 IU/L 23 25 20   ALT 0 - 32 IU/L 19 16 13      No results found for: CEA1 / No results found for: CEA1 No results found for: PSA1 No results found for: WPY099 No results found for: CAN125  No results found for: TOTALPROTELP, ALBUMINELP, A1GS, A2GS, BETS, BETA2SER, GAMS, MSPIKE, SPEI No results found for: TIBC, FERRITIN, IRONPCTSAT No results found for: LDH  STUDIES:  CUP PACEART REMOTE DEVICE CHECK  Result Date: 10/11/2020 ILR summary report received. Battery status OK. Normal device function. No new symptom, tachy, brady, or pause episodes. No new AF episodes. Monthly summary reports and ROV/PRN. HB     HISTORY:   Past Medical History:  Diagnosis Date  . Abdominal pain   . Accelerating angina ([place]) 08/26/2019  . Acute blood loss anemia 12/14/2019  . Allergic rhinitis 06/21/2014  . Anxiety   . Arthritis   . Asthma   . Atherosclerosis of arteries 08/24/2019  . Bilateral edema of lower extremity   . Body mass index 29.0-29.9, adult 08/24/2019  . CAD (coronary artery disease) 10/25/2019  .  Cellulitis of right leg   . Combined hyperlipidemia 08/24/2019  . Complication of anesthesia    difficulty waking  . Congestive heart failure (CHF) ([place])   . COPD (chronic obstructive pulmonary disease) ([place])   . Coronary artery disease   . Dyspnea 02/09/2019  . Dyspnea on exertion    . Embolic stroke ([place]) 12/02/6061  . Essential thrombocythemia ([place]) 06/22/2014  . Essential thrombocytosis ([place]) 06/22/2014  . Gastrointestinal bleeding 06/23/2018  . GERD (gastroesophageal reflux disease)   . History of melanoma excision    left greast toe 2015  . History of squamous cell carcinoma excision    face--  multiple excisions  . History of subdural hematoma    2008  . HTN (hypertension) 06/21/2014  . Hypertension   . Ischial bursitis, right 08/24/2019  . Leucocytosis 12/14/2019  . Malignant melanoma of great toe ([place]) 08/24/2019  . Melanoma in situ ([place]) 06/29/2014  . OSA (obstructive sleep apnea)    intolerant  . Pedal edema 08/24/2019  . Renal insufficiency 08/24/2019  . S/P CABG x 4 11/04/2019  . Skin cancer 08/24/2019  . Solitary pulmonary nodule on lung CT 02/09/2019  . Squamous cell carcinoma, scalp/neck 08/24/2019  . Stroke due to embolism ([place]) 11/25/2019  . Swelling of limb 08/24/2019  . Thrombocytosis    takes hydoxyurea--  MONITORED BY DR Hinton Rao [facility])  . VIN III (vulvar intraepithelial neoplasia III)   . Vitamin B 12 deficiency 08/24/2019  . Vitamin D deficiency 08/24/2019  . Wears dentures    UPPER AND LOWER PARTIAL  . Wears glasses     Past Surgical History:  Procedure Laterality Date  . ABDOMINAL HYSTERECTOMY  age 54  . CARDIAC CATHETERIZATION  03/172021  . CORONARY ARTERY BYPASS GRAFT N/A 11/04/2019   Procedure: CORONARY ARTERY BYPASS GRAFTING (CABG) x 4, with ENDOSCOPIC HARVESTING OF RIGHT GREATER SAPHENOUS VEIN.;  Surgeon: Gaye Pollack, MD;  Location: [facility];  Service: Open Heart Surgery;  Laterality: N/A;  . INTRAVASCULAR ULTRASOUND/IVUS N/A 11/03/2019   Procedure: Intravascular Ultrasound/IVUS;  Surgeon: Nelva Bush, MD;  Location: [facility];  Service: Cardiovascular;  Laterality: N/A;  . KNEE ARTHROSCOPY Left 2004  . LEFT HEART CATH AND CORONARY ANGIOGRAPHY N/A 11/03/2019   Procedure: LEFT HEART CATH AND CORONARY ANGIOGRAPHY;   Surgeon: Nelva Bush, MD;  Location: [facility];  Service: Cardiovascular;  Laterality: N/A;  . LOOP RECORDER INSERTION N/A 11/30/2019   Procedure: LOOP RECORDER INSERTION;  Surgeon: Thompson Grayer, MD;  Location: [facility];  Service: Cardiovascular;  Laterality: N/A;  . MELANOMA EXCISION  2015   left great toe  . REPAIR PERONEAL TENDONS ANKLE  2004  . SUBDURAL HEMATOMA EVACUATION VIA CRANIOTOMY  2008      week later  post-op  [facility]  . TEE WITHOUT CARDIOVERSION N/A 11/04/2019   Procedure: TRANSESOPHAGEAL ECHOCARDIOGRAM (TEE);  Surgeon: Gaye Pollack, MD;  Location: [facility];  Service: Open Heart Surgery;  Laterality: N/A;  . VULVECTOMY N/A 10/24/2015   Procedure: WIDE LOCAL EXCISION OF THE VULVA ;  Surgeon: Everitt Amber, MD;  Location: [facility];  Service: Gynecology;  Laterality: N/A;    Family History  Problem Relation Age of Onset  . Clotting disorder Mother   . Hypertension Mother   . Heart disease Mother   . Arthritis Mother   . Stroke Father   . Hypertension Father   . Heart disease Father   . Arthritis Father   .  Emphysema Father   . Asthma Father   . Kidney failure Father   . Hypertension Sister   . Heart disease Sister   . Arthritis Sister   . Hypertension Brother   . Heart disease Brother   . Arthritis Brother     Social History:  reports that she has never smoked. She has never used smokeless tobacco. She reports that she does not drink alcohol and does not use drugs.The patient is alone today.  Allergies:  Allergies  Allergen Reactions  . Cephalexin Hives  . Ciprofloxacin Other (See Comments)    Gi intolerance  . Penicillins Rash    Childhood reaction Did it involve swelling of the face/tongue/throat, SOB, or low BP? No Did it involve sudden or severe rash/hives, skin peeling, or any reaction on the inside of your mouth or nose? No Did you need to seek medical attention at a hospital or doctor's office? No When did  it last happen?50 years ago If all above answers are "NO", may proceed with cephalosporin use.     Current Medications: Current Outpatient Medications  Medication Sig Dispense Refill  . acetaminophen (TYLENOL) 325 MG tablet Take 1-2 tablets (325-650 mg total) by mouth every 4 (four) hours as needed for mild pain.    . Ascorbic Acid (VITAMIN C) 1000 MG tablet Take 1,000 mg by mouth daily.    Marland Kitchen atorvastatin (LIPITOR) 40 MG tablet TAKE 1 TABLET (40 MG TOTAL) BY MOUTH DAILY AT 6 PM. 90 tablet 2  . Calcium Carb-Cholecalciferol (CALCIUM 600 + D PO) Take 1 tablet by mouth in the morning and at bedtime.     . clopidogrel (PLAVIX) 75 MG tablet Take 1 tablet (75 mg total) by mouth daily. 90 tablet 3  . fluticasone (FLONASE) 50 MCG/ACT nasal spray Place 2 sprays into both nostrils at bedtime.     . Magnesium 250 MG TABS Take 250 mg by mouth at bedtime.     . metoprolol tartrate (LOPRESSOR) 25 MG tablet Take 1 tablet (25 mg total) by mouth 2 (two) times daily. 180 tablet 3  . Multiple Vitamins-Minerals (PRESERVISION AREDS 2) CAPS Take 1 capsule by mouth in the morning and at bedtime.     . pantoprazole (PROTONIX) 40 MG tablet Take 1 tablet (40 mg total) by mouth daily. 90 tablet 3  . traMADol (ULTRAM) 50 MG tablet Take 1 tablet (50 mg total) by mouth every 6 (six) hours as needed for moderate pain. 30 tablet   . Vitamin D, Ergocalciferol, (DRISDOL) 1.25 MG (50000 UT) CAPS capsule Take 50,000 Units by mouth every 14 (fourteen) days.     No current facility-administered medications for this visit.     ASSESSMENT & PLAN:   Assessment: 1. Essential thrombocythemia previously treated with hydroxyurea.  Due to low normal platelets, hydroxyurea was placed on hold in October 2021.  The platelets remain low normal.  We have been following her more closely due to concerns that she may be transforming into myelofibrosis or acute leukemia.  2. Leukocytosis/neutrophilia, which she has had on and off  since 2018.  This is felt to be due to her myeloproliferative disease and is stable since discontinuation of hydroxyurea.  We will continue to monitor this.  There is no evidence of transformation to acute myelogenous leukemia on the peripheral blood.  3. Macrocytic anemia, secondary to hydroxyurea, stable.   4. History of vulvar cancer, treated with surgical excision.  5. Right upper lobe nodule on CT chest seen last year.  Pulmonology is following this and recent CT chest was stable.  6. History of melanoma of the great toe, treated with surgical excision.  She remains without evidence of recurrence  7. Chronic back pain, which is being managed with narcotics by Dr. Maryjean Ka.   8. Status post CABG x4 in March 2021.  9. Status post CVA x2 in March and April.  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  10. New calcifications in the right breast in March 2021 warranting further evaluation.  Unilateral diagnostic mammogram from July characterized these as likely benign. She is overdue for bilateral diagnostic mammogram at this time.  11. Apparent iron deficiency, for which she has been on oral supplementation.  Her hemoglobin remains stable.   12. Chronic kidney disease, which remains stable.    Plan:     I will schedule the bilateral diagnostic mammogram with compression views of the right breast to re-evaluate the previously seen area of calcifications. Her blood counts remain stable, so we will continue to monitor her.  If her blood counts change drastically, then we may need to pursue bone marrow examination as she could evolve into myelofibrosis or leukemia.  We will plan to see her back in 2 months with CBC and comprehensive metabolic panel. The patient understands the plans discussed today and is in agreement with them.  She knows to contact our office if she develops concerns prior to her next appointment.    Marvia Pickles, PA-C

## 2020-10-25 DIAGNOSIS — E785 Hyperlipidemia, unspecified: Secondary | ICD-10-CM | POA: Diagnosis not present

## 2020-10-25 DIAGNOSIS — J45909 Unspecified asthma, uncomplicated: Secondary | ICD-10-CM | POA: Diagnosis not present

## 2020-10-25 DIAGNOSIS — D75839 Thrombocytosis, unspecified: Secondary | ICD-10-CM | POA: Diagnosis not present

## 2020-10-25 DIAGNOSIS — I2510 Atherosclerotic heart disease of native coronary artery without angina pectoris: Secondary | ICD-10-CM | POA: Diagnosis not present

## 2020-10-25 DIAGNOSIS — Z6826 Body mass index (BMI) 26.0-26.9, adult: Secondary | ICD-10-CM | POA: Diagnosis not present

## 2020-10-25 DIAGNOSIS — Z8673 Personal history of transient ischemic attack (TIA), and cerebral infarction without residual deficits: Secondary | ICD-10-CM | POA: Diagnosis not present

## 2020-10-25 DIAGNOSIS — J449 Chronic obstructive pulmonary disease, unspecified: Secondary | ICD-10-CM | POA: Diagnosis not present

## 2020-10-25 DIAGNOSIS — M545 Low back pain, unspecified: Secondary | ICD-10-CM | POA: Diagnosis not present

## 2020-11-12 LAB — CUP PACEART REMOTE DEVICE CHECK
Date Time Interrogation Session: 20220322230304
Implantable Pulse Generator Implant Date: 20210413

## 2020-11-13 ENCOUNTER — Ambulatory Visit (INDEPENDENT_AMBULATORY_CARE_PROVIDER_SITE_OTHER): Payer: PPO

## 2020-11-13 DIAGNOSIS — I6349 Cerebral infarction due to embolism of other cerebral artery: Secondary | ICD-10-CM | POA: Diagnosis not present

## 2020-11-27 DIAGNOSIS — E559 Vitamin D deficiency, unspecified: Secondary | ICD-10-CM | POA: Diagnosis not present

## 2020-11-27 DIAGNOSIS — E663 Overweight: Secondary | ICD-10-CM | POA: Diagnosis not present

## 2020-11-27 DIAGNOSIS — K219 Gastro-esophageal reflux disease without esophagitis: Secondary | ICD-10-CM | POA: Diagnosis not present

## 2020-11-27 DIAGNOSIS — M545 Low back pain, unspecified: Secondary | ICD-10-CM | POA: Diagnosis not present

## 2020-11-27 DIAGNOSIS — I10 Essential (primary) hypertension: Secondary | ICD-10-CM | POA: Diagnosis not present

## 2020-11-27 DIAGNOSIS — N3281 Overactive bladder: Secondary | ICD-10-CM | POA: Diagnosis not present

## 2020-11-27 DIAGNOSIS — R600 Localized edema: Secondary | ICD-10-CM | POA: Diagnosis not present

## 2020-11-27 DIAGNOSIS — Z6825 Body mass index (BMI) 25.0-25.9, adult: Secondary | ICD-10-CM | POA: Diagnosis not present

## 2020-11-27 DIAGNOSIS — Z79899 Other long term (current) drug therapy: Secondary | ICD-10-CM | POA: Diagnosis not present

## 2020-11-27 NOTE — Progress Notes (Signed)
Carelink Summary Report / Loop Recorder 

## 2020-12-11 ENCOUNTER — Ambulatory Visit (INDEPENDENT_AMBULATORY_CARE_PROVIDER_SITE_OTHER): Payer: PPO

## 2020-12-11 DIAGNOSIS — I6349 Cerebral infarction due to embolism of other cerebral artery: Secondary | ICD-10-CM

## 2020-12-12 LAB — CUP PACEART REMOTE DEVICE CHECK
Date Time Interrogation Session: 20220424230614
Implantable Pulse Generator Implant Date: 20210413

## 2020-12-18 ENCOUNTER — Other Ambulatory Visit: Payer: Self-pay

## 2020-12-18 ENCOUNTER — Inpatient Hospital Stay: Payer: PPO

## 2020-12-18 ENCOUNTER — Encounter: Payer: Self-pay | Admitting: Hematology and Oncology

## 2020-12-18 ENCOUNTER — Inpatient Hospital Stay: Payer: PPO | Attending: Hematology and Oncology | Admitting: Hematology and Oncology

## 2020-12-18 VITALS — BP 135/67 | HR 76 | Temp 98.9°F | Resp 18 | Ht 65.0 in | Wt 159.4 lb

## 2020-12-18 DIAGNOSIS — D473 Essential (hemorrhagic) thrombocythemia: Secondary | ICD-10-CM | POA: Diagnosis not present

## 2020-12-18 DIAGNOSIS — E756 Lipid storage disorder, unspecified: Secondary | ICD-10-CM | POA: Diagnosis not present

## 2020-12-18 DIAGNOSIS — R7309 Other abnormal glucose: Secondary | ICD-10-CM | POA: Diagnosis not present

## 2020-12-18 DIAGNOSIS — E559 Vitamin D deficiency, unspecified: Secondary | ICD-10-CM | POA: Diagnosis not present

## 2020-12-18 DIAGNOSIS — D72829 Elevated white blood cell count, unspecified: Secondary | ICD-10-CM | POA: Diagnosis not present

## 2020-12-18 DIAGNOSIS — R921 Mammographic calcification found on diagnostic imaging of breast: Secondary | ICD-10-CM

## 2020-12-18 DIAGNOSIS — E538 Deficiency of other specified B group vitamins: Secondary | ICD-10-CM | POA: Diagnosis not present

## 2020-12-18 DIAGNOSIS — D649 Anemia, unspecified: Secondary | ICD-10-CM

## 2020-12-18 LAB — IRON,TIBC AND FERRITIN PANEL
%SAT: 15.4
Ferritin: 198
Iron: 47
TIBC: 305

## 2020-12-18 LAB — HEPATIC FUNCTION PANEL
ALT: 11 (ref 7–35)
AST: 26 (ref 13–35)
Alkaline Phosphatase: 105 (ref 25–125)
Bilirubin, Total: 0.6

## 2020-12-18 LAB — CBC AND DIFFERENTIAL
HCT: 33 — AB (ref 36–46)
Hemoglobin: 10.6 — AB (ref 12.0–16.0)
Neutrophils Absolute: 19.36
Platelets: 153 (ref 150–399)
WBC: 24.2

## 2020-12-18 LAB — BASIC METABOLIC PANEL
BUN: 16 (ref 4–21)
CO2: 28 — AB (ref 13–22)
Chloride: 104 (ref 99–108)
Creatinine: 1 (ref 0.5–1.1)
Glucose: 107
Potassium: 4.4 (ref 3.4–5.3)
Sodium: 139 (ref 137–147)

## 2020-12-18 LAB — COMPREHENSIVE METABOLIC PANEL
Albumin: 4.2 (ref 3.5–5.0)
Calcium: 8.8 (ref 8.7–10.7)

## 2020-12-18 LAB — CBC
MCV: 96 (ref 81–99)
RBC: 3.48 — AB (ref 3.87–5.11)

## 2020-12-18 LAB — VITAMIN B12: Vitamin B-12: 265

## 2020-12-18 NOTE — Progress Notes (Signed)
[facility]  [address] [place],  [place]  [address] [phone]  Clinic Day:  12/21/2020  Referring physician: [name], MD   CHIEF COMPLAINT:  CC:   Essential thrombocytosis with fatigue  Current Treatment:   Observation   HISTORY OF PRESENT ILLNESS:  Yolanda Flowers is a 80 y.o. female with a history of essential thrombocythemia originally diagnosed in 44.  She was treated with hydroxyurea 500 mg 3 times daily.  We have had to occasionally adjust doses because of leukopenia or anemia.  She also had a malignant melanoma of her left first toe resected in November 2015 with partial amputation of the toe and has done well.  She has had multiple other skin cancers removed over the years.  In March 2017, she was seen by Dr. Denman George for HSIL/VIN 3 of the right posterior labia minora and underwent vulvectomy.  Hydroxyurea was decreased to 500 mg twice daily prior to surgery, as we did not feel we could safely hold hydroxyurea.  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  The patient had been seeing the gynecologist every 6 months, but has not continued regular follow-ups there. The platelet count increased in May 2017 to 657,000, so her dose of hydroxyurea was increased to 3 times daily, then subsequently decreased to twice a day due to leukopenia and anemia.  Her platelets had remained between under 500,000 since August 2018.  Bone density scan in March 2018 revealed osteopenia with a T-score of -1.6 in the femur, for which she is on calcium and vitamin-D.  She has had chronic back pain, which is attributed to severe degenerative disease.  She had an MRI of the lumbar spine in March 2018, which revealed significant degenerative disc disease with mild spinal stenosis at L1 and L4, but moderate at L2-3.  Dr. Maryjean Ka has given her injections, as well at hydrocodone/APAP 5/325.   She has had intermittent leukocytosis since 2018, felt to be secondary to her essential thrombocythemia.  She was evaluated in the emergency department in October 2019 and had a GI bleed. CT abdomen and pelvis revealed acute uncomplicated diverticulitis of the sigmoid colon with no other acute abnormalities.  A moderate to large hiatal hernia was seen.  No focal hepatic lesions or intrahepatic biliary dilatation were seen.  She saw Dr. Geraldo Pitter and underwent nuclear med stress test in November, which did not reveal any evidence of inducible ischemia or other abnormality.  Left ventricular size and function was normal with an ejection fraction of 79%.  She states she was placed on Spiriva and Ventolin.  She underwent EGD and colonoscopy in December  2019, and no significant source of blood loss was identified.  Repeat iron studies, B12 and folate did not reveal any nutritional deficiency  She was instructed to continue lansoprazole.  She had dilatation of an esophageal stricture and removal of a polyp. Pathology revealed a fragmented tubular adenoma.   In January 2020, she had worsening anemia, in addition to continued episodes of chest pain and worsening dyspnea with exertion at her routine follow-up. Due to the severe dyspnea and right lower extremity edema, we obtained a CT angiogram chest and venous Doppler ultrasound of the right lower extremity. CTA chest did not reveal any evidence of pulmonary embolism.  There was good opacification of the pulmonary arteries.  Diffuse coronary artery calcifications with cardiomegaly and moderate thoracic aortic atherosclerosis was seen.  There was a 3.6 mm noncalcified nodule in the anterior inferior right upper lobe.  Noncontrast CT chest in 6-12 months was recommended.  Right lower extremity venous Doppler ultrasound did not reveal any evidence of deep venous thrombosis.  Further evaluation of the anemia did not reveal evidence of hemolysis or monoclonal gammopathy.  In February 2020, she had persistent anemia with a hemoglobin of 9.7.  As she was symptomatic, hydroxyurea 500 mg was decreased to once daily.  Soluble transferrin was elevated, which was consistent with iron deficiency.  She saw Dr. Lyda Jester and he placed her on iron supplementation daily in the form of ferrous sulfate 65 mg. She was seen again in March and had improvement in her anemia with a hemoglobin of 10.2.  Her thrombocythemia remained controlled on hydroxyurea 500 mg daily. We encouraged her to have follow-up for her vulvar cancer, so followed up with Dr. Carlena Bjornstad and her exam was normal.   He did not feel he needed to check her again for 2 years unless she had problems.  Due to chronic dyspnea, she underwent multiple tests at [facility].  Apparently, she did not have evidence of COPD.  She was therefore referred to Cardiology.  She saw Dr. Geraldo Pitter and was undergoing cardiac evaluation when we saw her in January 2021.  She had a bilateral screening mammogram in February 2021, which revealed an area of calcifications in the right breast warranting further evaluation.  A diagnostic right mammogram was ordered, but this was delayed, because she underwent the open heart surgery with CABG x4 in March.  She had a monitor placed in the left chest wall the time of surgery.  She did not feel she can have mammogram due to the monitor in the left chest wall.  She then had 2 strokes following her surgery, from which she has recovered well, except for balance issues and left eye blindness.  She ambulates with a cane or walker depending on her circumstances.  She is on clopidogrel 75 mg daily, atorvastatin 80 mg daily, metoprolol 25 mg twice daily and pantoprazole 40 mg daily.  She had a venous Doppler ultrasound of the right lower extremity in March for swelling postoperatively which did not reveal any evidence of deep venous thrombosis.  She had a CT chest in July to follow-up on the pulmonary nodule which  were stable. She finally underwent diagnostic right mammogram from July which revealed likely benign 6 mm group of calcifications involving the upper inner quadrant at middle depth. Bilateral diagnostic mammogram in February 2022 was recommended  At her visit in October 2021, she reported elevated transaminases and purpura, which was attributed to atorvastatin.  Dr. Geraldo Pitter reduced the dose from 41m to 49mwith normalization of the transaminases and no further purpura.  Her platelets were in low normal range, so hydroxyurea was placed on hold in October.  She continued to decline the COVID-19 vaccine due to her medical comorbidities.   When she was seen in January of this year, her platelet count remained in the low normal range.  She had persistent leukocytosis with few immature white blood cells, but no evidence of transformation to acute myelogenous leukemia. Closer follow-up was recommended.  Repeat CBC in February was fairly stable. At her last visit, she stated her chronic back pain was not controlled with tramadol and advised her to see her primary care physician. She canceled her bilateral diagnostic mammogram with spot compression views of the right breast for 6 month follow-up on the  calcification/screening stating she only wants to have a mammogram once a year.  I explained that the mammogram in July was only done on the right breast, so she was due for bilateral mammogram.  She was not thrilled with the idea of mammogram, as she feels like she had more pain associated with the examination last year, but was willing to reschedule.  INTERVAL HISTORY:  Sydell is here today for repeat clinical reassessment. She states she had a fall in the grocery store about 2 weeks ago and her back pain has been worse. She did not seek medical attention at the time of her fall.  She reports severe fatigue, so she feels something bad is wrong.  She also reports numbness of the bilateral fingertips. She therefore  asks if we could check a vitamin B12 level. She denies fevers, chills or night sweats . Her appetite is good. Her weight has decreased 4 pounds over last 2 months.   She does not wish to have an x-ray of her spine for evaluation.  She also is unsure about having the mammogram.  REVIEW OF SYSTEMS:  Review of Systems  Constitutional: Positive for fatigue and unexpected weight change. Negative for appetite change, chills and fever.  HENT:   Negative for lump/mass, mouth sores and sore throat.   Respiratory: Negative for cough and shortness of breath.   Cardiovascular: Negative for chest pain and leg swelling.  Gastrointestinal: Negative for abdominal pain, constipation, diarrhea, nausea and vomiting.  Endocrine: Negative for hot flashes.  Genitourinary: Negative for difficulty urinating, dysuria, frequency and hematuria.   Musculoskeletal: Negative for arthralgias, back pain and myalgias.  Skin: Negative for rash.  Neurological: Negative for dizziness and headaches.  Hematological: Negative for adenopathy. Does not bruise/bleed easily.  Psychiatric/Behavioral: Negative for depression and sleep disturbance. The patient is not nervous/anxious.      VITALS:  Blood pressure 135/67, pulse 76, temperature 98.9 F (37.2 C), temperature source Oral, resp. rate 18, height '5\' 5"'  (1.651 m), weight 159 lb 6.4 oz (72.3 kg), SpO2 97 %.  Wt Readings from Last 3 Encounters:  12/20/20 170 lb (77.1 kg)  12/18/20 159 lb 6.4 oz (72.3 kg)  10/18/20 163 lb 8 oz (74.2 kg)    Body mass index is 26.53 kg/m.  Performance status (ECOG): 1 - Symptomatic but completely ambulatory  PHYSICAL EXAM:  Physical Exam Vitals and nursing note reviewed.  Constitutional:      General: She is not in acute distress.    Appearance: Normal appearance. She is not toxic-appearing.  HENT:     Mouth/Throat:     Mouth: Mucous membranes are moist.     Pharynx: Oropharynx is clear. No oropharyngeal exudate or posterior  oropharyngeal erythema.  Eyes:     General: No scleral icterus.    Extraocular Movements: Extraocular movements intact.     Conjunctiva/sclera: Conjunctivae normal.     Pupils: Pupils are equal, round, and reactive to light.  Cardiovascular:     Rate and Rhythm: Normal rate and regular rhythm.     Heart sounds: Normal heart sounds. No murmur heard. No friction rub. No gallop.   Pulmonary:     Effort: Pulmonary effort is normal.     Breath sounds: Normal breath sounds. No wheezing, rhonchi or rales.  Chest:  Breasts:     Right: No axillary adenopathy or supraclavicular adenopathy.     Left: No axillary adenopathy or supraclavicular adenopathy.      Comments: Breast exam is deferred Abdominal:  General: There is no distension.     Palpations: Abdomen is soft. There is no hepatomegaly, splenomegaly or mass.     Tenderness: There is no abdominal tenderness.  Musculoskeletal:        General: Normal range of motion.     Cervical back: Normal range of motion and neck supple. No tenderness.     Right lower leg: No edema.     Left lower leg: No edema.  Lymphadenopathy:     Cervical: No cervical adenopathy.     Upper Body:     Right upper body: No supraclavicular or axillary adenopathy.     Left upper body: No supraclavicular or axillary adenopathy.     Lower Body: No right inguinal adenopathy. No left inguinal adenopathy.  Skin:    General: Skin is warm and dry.     Coloration: Skin is not jaundiced.     Findings: No rash.  Neurological:     Mental Status: She is alert and oriented to person, place, and time.     Cranial Nerves: No cranial nerve deficit.  Psychiatric:        Mood and Affect: Mood normal.        Behavior: Behavior normal.        Thought Content: Thought content normal.    LABS:   CBC Latest Ref Rng & Units 12/21/2020 12/20/2020 12/18/2020  WBC 4.0 - 10.5 K/uL 23.2(H) 28.2(H) 24.2  Hemoglobin 12.0 - 15.0 g/dL 9.5(L) 10.3(L) 10.6(A)  Hematocrit 36.0 - 46.0 %  31.5(L) 33.8(L) 33(A)  Platelets 150 - 400 K/uL 154 172 153   CMP Latest Ref Rng & Units 12/21/2020 12/20/2020 12/18/2020  Glucose 70 - 99 mg/dL 97 99 -  BUN 8 - 23 mg/dL '11 15 16  ' Creatinine 0.44 - 1.00 mg/dL 0.94 0.96 1.0  Sodium 135 - 145 mmol/L 138 138 139  Potassium 3.5 - 5.1 mmol/L 4.3 4.5 4.4  Chloride 98 - 111 mmol/L 106 104 104  CO2 22 - 32 mmol/L 22 23 28(A)  Calcium 8.9 - 10.3 mg/dL 8.5(L) 9.0 8.8  Total Protein 6.5 - 8.1 g/dL 6.8 7.6 -  Total Bilirubin 0.3 - 1.2 mg/dL 0.3 0.8 -  Alkaline Phos 38 - 126 U/L 66 73 105  AST 15 - 41 U/L 13(L) 17 26  ALT 0 - 44 U/L '9 11 11     ' No results found for: CEA1 / No results found for: CEA1 No results found for: PSA1 No results found for: XYB338 No results found for: CAN125  No results found for: TOTALPROTELP, ALBUMINELP, A1GS, A2GS, BETS, BETA2SER, GAMS, MSPIKE, SPEI Lab Results  Component Value Date   TIBC 305 12/18/2020   FERRITIN 198 12/18/2020   IRONPCTSAT 15.4 12/18/2020   No results found for: LDH  STUDIES:  CT Angio Head W or Wo Contrast  Result Date: 12/20/2020 CLINICAL DATA:  Neuro deficit, acute, stroke suspected; history of intracerebral aneurysm, headache for several days, multiple falls today, pain in neck, rule out bleed or aneurysmal change. EXAM: CT ANGIOGRAPHY HEAD AND NECK TECHNIQUE: Multidetector CT imaging of the head and neck was performed using the standard protocol during bolus administration of intravenous contrast. Multiplanar CT image reconstructions and MIPs were obtained to evaluate the vascular anatomy. Carotid stenosis measurements (when applicable) are obtained utilizing NASCET criteria, using the distal internal carotid diameter as the denominator. CONTRAST:  74m OMNIPAQUE IOHEXOL 350 MG/ML SOLN COMPARISON:  Brain MRI 11/25/2019. CT angiogram head/neck 11/25/2019. FINDINGS: CT HEAD FINDINGS Brain:  Mild cerebral and cerebellar atrophy. Acute parafalcine subdural hematoma measuring up to 10 mm in greatest  thickness (series 5, image 11). Unchanged size of a thin chronic subdural hematoma overlying the left cerebral hemisphere, measuring up to 4 mm, deep to the left-sided cranioplasty. Subacute infarct within the left thalamus. Additional known small subacute infarcts within the supratentorial and infratentorial brain were better appreciated on the prior brain MRI of 11/25/2019. 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 Advanced patchy and ill-defined hypoattenuation within the cerebral white matter, nonspecific but compatible with chronic small vessel ischemic disease. No acute demarcated cortical infarct. No evidence of intracranial mass. No midline shift. Vascular: No hyperdense vessel.  Atherosclerotic calcifications Skull: No calvarial fracture.  Left-sided cranioplasty. Sinuses: Scattered trace mucosal thickening and fluid within the bilateral ethmoid air cells. Small right maxillary sinus mucous retention cyst. Orbits: No mass or acute finding. Review of the MIP images confirms the above findings CTA NECK FINDINGS Aortic arch: Standard aortic branching. Atherosclerotic plaque within the visualized aortic arch and proximal major branch vessels of the neck. No hemodynamically significant innominate or proximal subclavian artery stenosis. Right carotid system: CCA and ICA patent within the neck without significant stenosis (50% or greater). Mild calcified plaque within the carotid bifurcation and proximal ICA. Left carotid system: CCA and ICA patent within the neck without significant stenosis (50% or greater). Mild soft and calcified plaque within the carotid bifurcation and proximal ICA. Vertebral arteries: Codominant patent within the neck without stenosis. Mild nonstenotic calcified plaque at the origin of the right vertebral artery. Skeleton: No acute bony abnormality or aggressive osseous lesion. Cervical spondylosis. Cervical levocurvature with partially imaged thoracic  dextrocurvature. Other neck: Neck mass or cervical lymphadenopathy. Upper chest: Consolidation within portions of the right upper lobe and within imaged portions of the superior right lower lobe compatible with pneumonia. A small ill-defined opacity also present within the left lung apex likely reflecting an additional site of pneumonia. Prior median sternotomy. Review of the MIP images confirms the above findings CTA HEAD FINDINGS Anterior circulation: The intracranial internal carotid arteries are patent. Plaque within both vessels without stenosis. Unchanged 2 mm inferomedially projecting aneurysm arising from the supraclinoid left ICA (series 12, image 122). The M1 middle cerebral arteries are patent. No M2 proximal branch occlusion or high-grade proximal stenosis is identified. The anterior cerebral arteries are patent. Posterior circulation: The intracranial vertebral arteries are patent. The basilar artery is patent. The posterior cerebral arteries are patent. Posterior communicating arteries are hypoplastic or absent bilaterally. Venous sinuses: Within the limitations of contrast timing, no convincing thrombus. Anatomic variants: As described Review of the MIP images confirms the above findings Acute CT head findings called by telephone at the time of interpretation on 12/20/2020 at 7:55 pm to provider [facility] , who verbally acknowledged these results. IMPRESSION: CT head: 1. Acute parafalcine subdural hematoma measuring up to 10 mm in greatest thickness. 2. Unchanged thin chronic subdural hematoma overlying the left cerebral hemisphere, deep to the left-sided cranioplasty (4 mm in thickness). 3. Subacute infarct within the left thalamus. Additional known small subacute infarcts within the supratentorial and infratentorial brain were better appreciated on the prior brain MRI of 11/25/2019. 4. 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 5. Stable parenchymal atrophy and severe  cerebral white matter chronic small vessel ischemic disease. 6. Mild paranasal sinus disease, as described. CTA neck: 1. The common carotid, internal carotid and vertebral arteries are patent within the neck without hemodynamically significant stenosis. Atherosclerotic disease,  as described. 2. Findings compatible right upper lobe, right lower lobe and left upper lobe pneumonia. Right lower lobe pneumonia is incompletely imaged. CTA head: 1. No intracranial large vessel occlusion or proximal high-grade arterial stenosis. 2. 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 Electronically Signed   By: Kellie Simmering DO   On: 12/20/2020 19:57   DG Chest 2 View  Result Date: 12/20/2020 CLINICAL DATA:  Dyspnea, fatigue, congestive heart failure EXAM: CHEST - 2 VIEW COMPARISON:  12/22/2019 FINDINGS: There is focal consolidation within the posterior right upper lobe, likely infectious in the acute setting. The chin overlies the lung apices, however, no definite pneumothorax. No pleural effusion. Cardiac size is mildly enlarged. Coronary artery bypass grafting has been performed. The pulmonary vascularity is normal. No acute bone abnormality. IMPRESSION: Right upper lobe consolidation in keeping with acute lobar pneumonia in the appropriate clinical setting. Follow-up chest radiograph is recommended in 3-4 weeks to document resolution and exclude the presence of a central obstructing lesion. Electronically Signed   By: Fidela Salisbury MD   On: 12/20/2020 16:32   CT HEAD WO CONTRAST  Result Date: 12/21/2020 CLINICAL DATA:  Subdural hemorrhage.  Stroke follow-up EXAM: CT HEAD WITHOUT CONTRAST TECHNIQUE: Contiguous axial images were obtained from the base of the skull through the vertex without intravenous contrast. COMPARISON:  Brain MRI from earlier today FINDINGS: Brain: High-density subdural hematoma along the falx measuring up to 8 mm in thickness, non progressed. No  associated significant mass effect. Extensive acute and chronic ischemia as characterized by MRI earlier the same day. No hydrocephalus or shift. Vascular: Negative Skull: Unremarkable remote left craniotomy. Sinuses/Orbits: Bilateral cataract resection IMPRESSION: 1. No interval progression of the inter hemispheric subdural hematoma which measures up to 8 mm in thickness. 2. Acute and chronic ischemia, reference preceding brain MRI. Electronically Signed   By: Monte Fantasia M.D.   On: 12/21/2020 05:01   CT Angio Neck W and/or Wo Contrast  Result Date: 12/20/2020 CLINICAL DATA:  Neuro deficit, acute, stroke suspected; history of intracerebral aneurysm, headache for several days, multiple falls today, pain in neck, rule out bleed or aneurysmal change. EXAM: CT ANGIOGRAPHY HEAD AND NECK TECHNIQUE: Multidetector CT imaging of the head and neck was performed using the standard protocol during bolus administration of intravenous contrast. Multiplanar CT image reconstructions and MIPs were obtained to evaluate the vascular anatomy. Carotid stenosis measurements (when applicable) are obtained utilizing NASCET criteria, using the distal internal carotid diameter as the denominator. CONTRAST:  55m OMNIPAQUE IOHEXOL 350 MG/ML SOLN COMPARISON:  Brain MRI 11/25/2019. CT angiogram head/neck 11/25/2019. FINDINGS: CT HEAD FINDINGS Brain: Mild cerebral and cerebellar atrophy. Acute parafalcine subdural hematoma measuring up to 10 mm in greatest thickness (series 5, image 11). Unchanged size of a thin chronic subdural hematoma overlying the left cerebral hemisphere, measuring up to 4 mm, deep to the left-sided cranioplasty. Subacute infarct within the left thalamus. Additional known small subacute infarcts within the supratentorial and infratentorial brain were better appreciated on the prior brain MRI of 11/25/2019. 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 Advanced patchy and ill-defined  hypoattenuation within the cerebral white matter, nonspecific but compatible with chronic small vessel ischemic disease. No acute demarcated cortical infarct. No evidence of intracranial mass. No midline shift. Vascular: No hyperdense vessel.  Atherosclerotic calcifications Skull: No calvarial fracture.  Left-sided cranioplasty. Sinuses: Scattered trace mucosal thickening and fluid within the bilateral ethmoid air cells. Small right maxillary sinus mucous  retention cyst. Orbits: No mass or acute finding. Review of the MIP images confirms the above findings CTA NECK FINDINGS Aortic arch: Standard aortic branching. Atherosclerotic plaque within the visualized aortic arch and proximal major branch vessels of the neck. No hemodynamically significant innominate or proximal subclavian artery stenosis. Right carotid system: CCA and ICA patent within the neck without significant stenosis (50% or greater). Mild calcified plaque within the carotid bifurcation and proximal ICA. Left carotid system: CCA and ICA patent within the neck without significant stenosis (50% or greater). Mild soft and calcified plaque within the carotid bifurcation and proximal ICA. Vertebral arteries: Codominant patent within the neck without stenosis. Mild nonstenotic calcified plaque at the origin of the right vertebral artery. Skeleton: No acute bony abnormality or aggressive osseous lesion. Cervical spondylosis. Cervical levocurvature with partially imaged thoracic dextrocurvature. Other neck: Neck mass or cervical lymphadenopathy. Upper chest: Consolidation within portions of the right upper lobe and within imaged portions of the superior right lower lobe compatible with pneumonia. A small ill-defined opacity also present within the left lung apex likely reflecting an additional site of pneumonia. Prior median sternotomy. Review of the MIP images confirms the above findings CTA HEAD FINDINGS Anterior circulation: The intracranial internal carotid  arteries are patent. Plaque within both vessels without stenosis. Unchanged 2 mm inferomedially projecting aneurysm arising from the supraclinoid left ICA (series 12, image 122). The M1 middle cerebral arteries are patent. No M2 proximal branch occlusion or high-grade proximal stenosis is identified. The anterior cerebral arteries are patent. Posterior circulation: The intracranial vertebral arteries are patent. The basilar artery is patent. The posterior cerebral arteries are patent. Posterior communicating arteries are hypoplastic or absent bilaterally. Venous sinuses: Within the limitations of contrast timing, no convincing thrombus. Anatomic variants: As described Review of the MIP images confirms the above findings Acute CT head findings called by telephone at the time of interpretation on 12/20/2020 at 7:55 pm to provider [facility] , who verbally acknowledged these results. IMPRESSION: CT head: 1. Acute parafalcine subdural hematoma measuring up to 10 mm in greatest thickness. 2. Unchanged thin chronic subdural hematoma overlying the left cerebral hemisphere, deep to the left-sided cranioplasty (4 mm in thickness). 3. Subacute infarct within the left thalamus. Additional known small subacute infarcts within the supratentorial and infratentorial brain were better appreciated on the prior brain MRI of 11/25/2019. 4. 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 5. Stable parenchymal atrophy and severe cerebral white matter chronic small vessel ischemic disease. 6. Mild paranasal sinus disease, as described. CTA neck: 1. The common carotid, internal carotid and vertebral arteries are patent within the neck without hemodynamically significant stenosis. Atherosclerotic disease, as described. 2. Findings compatible right upper lobe, right lower lobe and left upper lobe pneumonia. Right lower lobe pneumonia is incompletely imaged. CTA head: 1. No intracranial large vessel occlusion or  proximal high-grade arterial stenosis. 2. 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 Electronically Signed   By: Kellie Simmering DO   On: 12/20/2020 19:57   MR BRAIN WO CONTRAST  Result Date: 12/21/2020 CLINICAL DATA:  Stroke symptoms and parafalcine subdural hematoma EXAM: MRI HEAD WITHOUT CONTRAST TECHNIQUE: Multiplanar, multiecho pulse sequences of the brain and surrounding structures were obtained without intravenous contrast. COMPARISON:  Head CT 12/20/2020 FINDINGS: Brain: There is multifocal acute ischemia throughout both cerebral and cerebellar hemispheres. The pattern is most suggestive watershed infarcts. The subdural hematoma along the falx cerebri is unchanged. Hyperintense T2-weighted signal is  moderately widespread throughout the white matter. Generalized volume loss without a clear lobar predilection. The midline structures are normal. Vascular: Major flow voids are preserved. Skull and upper cervical spine: Remote left pterional craniotomy. Sinuses/Orbits:No paranasal sinus fluid levels or advanced mucosal thickening. No mastoid or middle ear effusion. Normal orbits. IMPRESSION: 1. Multifocal acute ischemia throughout both cerebral and cerebellar hemispheres, in a pattern most suggestive of watershed infarcts. 2. Unchanged subdural hematoma along the falx cerebri. Electronically Signed   By: Ulyses Jarred M.D.   On: 12/21/2020 00:55   CT CHEST ABDOMEN PELVIS W CONTRAST  Result Date: 12/21/2020 CLINICAL DATA:  Multiple falls yesterday EXAM: CT CHEST, ABDOMEN, AND PELVIS WITH CONTRAST TECHNIQUE: Multidetector CT imaging of the chest, abdomen and pelvis was performed following the standard protocol during bolus administration of intravenous contrast. CONTRAST:  13m OMNIPAQUE IOHEXOL 300 MG/ML  SOLN COMPARISON:  12/20/2020, 02/22/2020 FINDINGS: CT CHEST FINDINGS Cardiovascular: The heart and great vessels are unremarkable with no pericardial effusion.  Dense calcification of the mitral annulus. Extensive atherosclerosis of the native coronary vessels, with evidence of prior CABG. No evidence of thoracic aortic aneurysm or dissection. Moderate atherosclerosis of the aortic arch. Mediastinum/Nodes: No enlarged mediastinal, hilar, or axillary lymph nodes. Thyroid gland, trachea, and esophagus demonstrate no significant findings. Lungs/Pleura: There is a 6 mm right upper lobe pulmonary nodule reference image 29/6, and a 5 mm right middle lobe pulmonary nodule reference image 39/6. These are unchanged since 2020 and can be considered benign. There is dense consolidation within the right upper lobe and superior segment right lower lobe with air bronchograms, compatible with pneumonia. Nodular area of consolidation within the superior segment left lower lobe image 30/6 measures 9 mm, and may reflect an additional focus of infection though follow-up CT will be needed to assess resolution. No effusion or pneumothorax.  The central airways are patent. Musculoskeletal: There are no acute or destructive bony lesions. Reconstructed images demonstrate no additional findings. CT ABDOMEN PELVIS FINDINGS Hepatobiliary: High attenuation material within the gallbladder likely reflects vicarious excretion of previously administered contrast. No evidence of cholelithiasis or cholecystitis. Liver is unremarkable. Pancreas: Unremarkable. No pancreatic ductal dilatation or surrounding inflammatory changes. Spleen: Borderline splenomegaly measuring 12.1 cm in anterior-posterior dimension, stable. No focal abnormalities. Adrenals/Urinary Tract: Stable hypodensities right kidney compatible with small cysts. No urinary tract calculi or obstructive uropathy. Excreted contrast is seen within the urinary bladder, with no filling defects. The adrenals are unremarkable. Stomach/Bowel: No bowel obstruction or ileus. Diffuse diverticulosis of the distal colon without diverticulitis. No bowel wall  thickening or inflammatory change. Vascular/Lymphatic: Aortic atherosclerosis. No enlarged abdominal or pelvic lymph nodes. Reproductive: Status post hysterectomy. No adnexal masses. Other: No free fluid or free gas.  No abdominal wall hernia. Musculoskeletal: No acute or destructive bony lesions. Reconstructed images demonstrate no additional findings. IMPRESSION: 1. No acute intrathoracic, intra-abdominal, or intrapelvic trauma. 2. Multifocal pneumonia, most pronounced in the right upper and right lower lobes. 3. Nodular consolidation within the superior segment left lower lobe measuring 9 mm, favor infection. CT follow-up will be needed after appropriate medical management to document resolution. 4. Benign subcentimeter nodules within the right upper and right middle lobes. 5. Borderline splenomegaly. 6.  Aortic Atherosclerosis (ICD10-I70.0). Electronically Signed   By: MRanda NgoM.D.   On: 12/21/2020 15:34   DG Knee Complete 4 Views Left  Result Date: 12/20/2020 CLINICAL DATA:  Pain following fall EXAM: LEFT KNEE - COMPLETE 4+ VIEW COMPARISON:  None. FINDINGS: Frontal, lateral, and bilateral oblique  views were obtained. There is no fracture or dislocation. No joint effusion. There is moderate narrowing medially and in the patellofemoral joint region. There is spurring in all compartments. No erosive changes. There are foci of arterial vascular calcification. 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 No fracture, dislocation, or effusion. Atherosclerotic arterial vascular calcifications noted. Electronically Signed   By: Lowella Grip III M.D.   On: 12/20/2020 16:28   DG Knee Complete 4 Views Right  Result Date: 12/20/2020 CLINICAL DATA:  Pain following fall EXAM: RIGHT KNEE - COMPLETE 4+ VIEW COMPARISON:  None. FINDINGS: Frontal, lateral, and bilateral oblique views were obtained. No appreciable fracture or dislocation. No joint effusion. There is  moderate narrowing of the patellofemoral joint. There is mild narrowing medially. There is spurring in all compartments. No erosive change. There are multiple foci of arterial vascular calcification. Surgical clips are noted medial to the proximal tibia. IMPRESSION: Osteoarthritic change early, primarily in the patellofemoral joint and medial compartments. No fracture, dislocation, or effusion. Foci of atherosclerotic arterial vascular calcification noted. Electronically Signed   By: Lowella Grip III M.D.   On: 12/20/2020 16:29   CUP PACEART REMOTE DEVICE CHECK  Result Date: 12/12/2020 ILR summary report received. Battery status OK. Normal device function. No new symptom, tachy, brady, or pause episodes. No new AF episodes. Monthly summary reports and ROV/PRN  ECHOCARDIOGRAM COMPLETE BUBBLE STUDY  Result Date: 12/21/2020    ECHOCARDIOGRAM REPORT   Patient Name:   DALISHA SHIVELY Date of Exam: 12/21/2020 Medical Rec #:  [id number]      Height:       66.0 in Accession #:    [id number]     Weight:       170.0 lb Date of Birth:  06-24-1941     BSA:          1.866 m Patient Age:    1 years       BP:           125/73 mmHg Patient Gender: F              HR:           71 bpm. Exam Location:  Inpatient Procedure: 2D Echo, Cardiac Doppler, Color Doppler and Saline Contrast Bubble            Study Indications:    CVA  History:        Patient has prior history of Echocardiogram examinations, most                 recent 11/25/2019. CAD, Stroke and COPD, Signs/Symptoms:Shortness                 of Breath; Risk Factors:Hypertension and Dyslipidemia.  Sonographer:    [place] Referring Phys: [id number] [place]  1. Agitated saline contrast bubble study was negative, with no evidence of any interatrial shunt.  2. Left ventricular ejection fraction, by estimation, is 65 to 70%. The left ventricle has normal function. The left ventricle has no regional wall motion abnormalities. Left ventricular  diastolic parameters are consistent with Grade I diastolic dysfunction (impaired relaxation).  3. Right ventricular systolic function is normal. The right ventricular size is normal. There is normal pulmonary artery systolic pressure. The estimated right ventricular systolic pressure is 96.2 mmHg.  4. Left atrial size was mildly dilated.  5. The mitral valve is abnormal. Trivial mitral valve regurgitation. The mean mitral valve gradient is 3.0 mmHg  with average heart rate of 72 bpm. Moderate mitral annular calcification. Calcified papillary muscle noted, not as well seen in prior study.  6. The aortic valve is tricuspid. Aortic valve regurgitation is not visualized. Aortic valve sclerosis is present, with no evidence of aortic valve stenosis. Comparison(s): A prior study was performed on 11/25/19. No significant change from prior study. Prior images reviewed side by side. FINDINGS  Left Ventricle: Left ventricular ejection fraction, by estimation, is 65 to 70%. The left ventricle has normal function. The left ventricle has no regional wall motion abnormalities. The left ventricular internal cavity size was normal in size. There is  no left ventricular hypertrophy of the basal-septal segment. Left ventricular diastolic parameters are consistent with Grade I diastolic dysfunction (impaired relaxation). Right Ventricle: The right ventricular size is normal. No increase in right ventricular wall thickness. Right ventricular systolic function is normal. There is normal pulmonary artery systolic pressure. The tricuspid regurgitant velocity is 2.84 m/s, and  with an assumed right atrial pressure of 3 mmHg, the estimated right ventricular systolic pressure is 56.8 mmHg. Left Atrium: Left atrial size was mildly dilated. Right Atrium: Right atrial size was normal in size. Pericardium: Trivial pericardial effusion is present. Mitral Valve: Calcified papillary muscle noted. The mitral valve is abnormal. There is moderate  calcification of the mitral valve leaflet(s). Moderate mitral annular calcification. Trivial mitral valve regurgitation. MV peak gradient, 7.8 mmHg. The mean mitral valve gradient is 3.0 mmHg with average heart rate of 72 bpm. Tricuspid Valve: The tricuspid valve is grossly normal. Tricuspid valve regurgitation is mild. Aortic Valve: The aortic valve is tricuspid. Aortic valve regurgitation is not visualized. Mild aortic valve sclerosis is present, with no evidence of aortic valve stenosis. Aortic valve mean gradient measures 7.0 mmHg. Aortic valve peak gradient measures 14.2 mmHg. Aortic valve area, by VTI measures 2.04 cm. Pulmonic Valve: The pulmonic valve was grossly normal. Pulmonic valve regurgitation is not visualized. No evidence of pulmonic stenosis. Aorta: The aortic root and ascending aorta are structurally normal, with no evidence of dilitation. IAS/Shunts: The atrial septum is grossly normal. Agitated saline contrast was given intravenously to evaluate for intracardiac shunting. Agitated saline contrast bubble study was negative, with no evidence of any interatrial shunt.  LEFT VENTRICLE PLAX 2D LVIDd:         5.00 cm     Diastology LVIDs:         1.70 cm     LV e' medial:    4.79 cm/s LV PW:         0.80 cm     LV E/e' medial:  15.3 LV IVS:        0.90 cm     LV e' lateral:   7.94 cm/s LVOT diam:     1.80 cm     LV E/e' lateral: 9.2 LV SV:         72 LV SV Index:   38 LVOT Area:     2.54 cm  LV Volumes (MOD) LV vol d, MOD A2C: 29.3 ml LV vol d, MOD A4C: 36.6 ml LV vol s, MOD A2C: 8.6 ml LV vol s, MOD A4C: 15.8 ml LV SV MOD A2C:     20.7 ml LV SV MOD A4C:     36.6 ml LV SV MOD BP:      20.5 ml RIGHT VENTRICLE RV S prime:     16.00 cm/s  PULMONARY VEINS TAPSE (M-mode): 1.8 cm      A Reversal  Duration: 116.00 msec                             A Reversal Velocity: 33.20 cm/s                             Diastolic Velocity:  59.56 cm/s                             S/D Velocity:        1.50                              Systolic Velocity:   38.75 cm/s LEFT ATRIUM             Index       RIGHT ATRIUM           Index LA diam:        3.70 cm 1.98 cm/m  RA Area:     16.10 cm LA Vol (A2C):   77.2 ml 41.36 ml/m RA Volume:   38.80 ml  20.79 ml/m LA Vol (A4C):   48.1 ml 25.77 ml/m LA Biplane Vol: 65.3 ml 34.99 ml/m  AORTIC VALVE                    PULMONIC VALVE AV Area (Vmax):    2.24 cm     PV Vmax:       0.88 m/s AV Area (Vmean):   2.19 cm     PV Vmean:      64.500 cm/s AV Area (VTI):     2.04 cm     PV VTI:        0.179 m AV Vmax:           188.50 cm/s  PV Peak grad:  3.1 mmHg AV Vmean:          119.500 cm/s PV Mean grad:  2.0 mmHg AV VTI:            0.351 m AV Peak Grad:      14.2 mmHg AV Mean Grad:      7.0 mmHg LVOT Vmax:         166.00 cm/s LVOT Vmean:        103.000 cm/s LVOT VTI:          0.281 m LVOT/AV VTI ratio: 0.80  AORTA Ao Root diam: 3.50 cm Ao Asc diam:  3.20 cm MITRAL VALVE                TRICUSPID VALVE MV Area (PHT): 1.62 cm     TR Peak grad:   32.3 mmHg MV Area VTI:   2.03 cm     TR Vmax:        284.00 cm/s MV Peak grad:  7.8 mmHg MV Mean grad:  3.0 mmHg     SHUNTS MV Vmax:       1.40 m/s     Systemic VTI:  0.28 m MV Vmean:      73.7 cm/s    Systemic Diam: 1.80 cm MV Decel Time: 468 msec MR Peak grad: 43.8 mmHg MR Vmax:      331.00 cm/s MV E velocity: 73.30 cm/s MV A velocity: 127.00 cm/s MV E/A ratio:  0.58 Rudean Haskell MD Electronically signed by Lyda Kalata  Chandrasekhar MD Signature Date/Time: 12/21/2020/11:24:53 AM    Final       HISTORY:   Past Medical History:  Diagnosis Date  . Abdominal pain   . Accelerating angina ([place]) 08/26/2019  . Acute blood loss anemia 12/14/2019  . Allergic rhinitis 06/21/2014  . Anxiety   . Arthritis   . Asthma   . Atherosclerosis of arteries 08/24/2019  . Bilateral edema of lower extremity   . Body mass index 29.0-29.9, adult 08/24/2019  . CAD (coronary artery disease) 10/25/2019  . Cellulitis of right leg   . Combined hyperlipidemia 08/24/2019  .  Complication of anesthesia    difficulty waking  . Congestive heart failure (CHF) ([place])   . COPD (chronic obstructive pulmonary disease) ([place])   . Coronary artery disease   . Dyspnea 02/09/2019  . Dyspnea on exertion   . Embolic stroke ([place]) 9/35/7017  . Essential thrombocythemia ([place]) 06/22/2014  . Essential thrombocytosis ([place]) 06/22/2014  . Gastrointestinal bleeding 06/23/2018  . GERD (gastroesophageal reflux disease)   . History of melanoma excision    left greast toe 2015  . History of squamous cell carcinoma excision    face--  multiple excisions  . History of subdural hematoma    2008  . HTN (hypertension) 06/21/2014  . Hypertension   . Ischial bursitis, right 08/24/2019  . Leucocytosis 12/14/2019  . 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 Melanoma in situ ([place]) 06/29/2014  . OSA (obstructive sleep apnea)    intolerant  . Pedal edema 08/24/2019  . Renal insufficiency 08/24/2019  . S/P CABG x 4 11/04/2019  . Skin cancer 08/24/2019  . Solitary pulmonary nodule on lung CT 02/09/2019  . Squamous cell carcinoma, scalp/neck 08/24/2019  . Stroke due to embolism ([place]) 11/25/2019  . Swelling of limb 08/24/2019  . Thrombocytosis    takes hydoxyurea--  MONITORED BY DR Hinton Rao [facility]  . VIN III (vulvar intraepithelial neoplasia III)   . Vitamin B 12 deficiency 08/24/2019  . Vitamin D deficiency 08/24/2019  . Wears dentures    UPPER AND LOWER PARTIAL  . Wears glasses     Past Surgical History:  Procedure Laterality Date  . ABDOMINAL HYSTERECTOMY  age 20  . CARDIAC CATHETERIZATION  03/172021  . CORONARY ARTERY BYPASS GRAFT N/A 11/04/2019   Procedure: CORONARY ARTERY BYPASS GRAFTING (CABG) x 4, with ENDOSCOPIC HARVESTING OF RIGHT GREATER SAPHENOUS VEIN.;  Surgeon: Gaye Pollack, MD;  Location: [place] OR;  Service: Open Heart Surgery;  Laterality: N/A;  . INTRAVASCULAR ULTRASOUND/IVUS N/A 11/03/2019   Procedure: Intravascular Ultrasound/IVUS;  Surgeon: Nelva Bush, MD;   Location: [place] [facility];  Service: Cardiovascular;  Laterality: N/A;  . KNEE ARTHROSCOPY Left 2004  . LEFT HEART CATH AND CORONARY ANGIOGRAPHY N/A 11/03/2019   Procedure: LEFT HEART CATH AND CORONARY ANGIOGRAPHY;  Surgeon: Nelva Bush, MD;  Location: [place] [facility];  Service: Cardiovascular;  Laterality: N/A;  . LOOP RECORDER INSERTION N/A 11/30/2019   Procedure: LOOP RECORDER INSERTION;  Surgeon: Thompson Grayer, MD;  Location: [place] [facility];  Service: Cardiovascular;  Laterality: N/A;  . MELANOMA EXCISION  2015   left great toe  . REPAIR PERONEAL TENDONS ANKLE  2004  . SUBDURAL HEMATOMA EVACUATION VIA CRANIOTOMY  2008      week later  post-op  [facility]  . TEE WITHOUT CARDIOVERSION N/A 11/04/2019   Procedure: TRANSESOPHAGEAL ECHOCARDIOGRAM (TEE);  Surgeon: Gaye Pollack, MD;  Location: [employer];  Service: Open Heart  Surgery;  Laterality: N/A;  . VULVECTOMY N/A 10/24/2015   Procedure: WIDE LOCAL EXCISION OF THE VULVA ;  Surgeon: Everitt Amber, MD;  Location: [place];  Service: Gynecology;  Laterality: N/A;    Family History  Problem Relation Age of Onset  . Clotting disorder Mother   . Hypertension Mother   . Heart disease Mother   . Arthritis Mother   . Stroke Father   . Hypertension Father   . Heart disease Father   . Arthritis Father   . Emphysema Father   . Asthma Father   . Kidney failure Father   . Hypertension Sister   . Heart disease Sister   . Arthritis Sister   . Hypertension Brother   . Heart disease Brother   . Arthritis Brother     Social History:  reports that she has never smoked. She has never used smokeless tobacco. She reports that she does not drink alcohol and does not use drugs.The patient is alone today.  Allergies:  Allergies  Allergen Reactions  . Cephalexin Hives  . Penicillin G   . Ciprofloxacin Other (See Comments)    Gi intolerance  . Penicillins Rash    Childhood reaction Did it involve swelling of the  face/tongue/throat, SOB, or low BP? No Did it involve sudden or severe rash/hives, skin peeling, or any reaction on the inside of your mouth or nose? No Did you need to seek medical attention at a hospital or doctor's office? No When did it last happen?50 years ago If all above answers are "NO", may proceed with cephalosporin use.     Current Medications: No current facility-administered medications for this visit.   Current Outpatient Medications  Medication Sig Dispense Refill  . acetaminophen (TYLENOL) 325 MG tablet Take 1-2 tablets (325-650 mg total) by mouth every 4 (four) hours as needed for mild pain.    Marland Kitchen ammonium lactate (LAC-HYDRIN) 12 % lotion Apply 1 application topically as needed for dry skin.    . Ascorbic Acid (VITAMIN C) 1000 MG tablet Take 1,000 mg by mouth daily.    Marland Kitchen atorvastatin (LIPITOR) 80 MG tablet Take 80 mg by mouth daily. Takes it at 6pm daily    . Calcium Carb-Cholecalciferol (CALCIUM 600 + D PO) Take 1 tablet by mouth in the morning and at bedtime.     . clopidogrel (PLAVIX) 75 MG tablet Take 1 tablet (75 mg total) by mouth daily. 90 tablet 3  . fluticasone (FLONASE) 50 MCG/ACT nasal spray Place 2 sprays into both nostrils at bedtime.     . Magnesium 250 MG TABS Take 250 mg by mouth at bedtime.     . metoprolol tartrate (LOPRESSOR) 25 MG tablet Take 1 tablet (25 mg total) by mouth 2 (two) times daily. 180 tablet 3  . Multiple Vitamins-Minerals (PRESERVISION AREDS 2) CAPS Take 1 capsule by mouth in the morning and at bedtime.     . pantoprazole (PROTONIX) 40 MG tablet Take 1 tablet (40 mg total) by mouth daily. 90 tablet 3  . traMADol (ULTRAM) 50 MG tablet Take 1 tablet (50 mg total) by mouth every 6 (six) hours as needed for moderate pain. 30 tablet   . Vitamin D, Ergocalciferol, (DRISDOL) 1.25 MG (50000 UT) CAPS capsule Take 50,000 Units by mouth every 14 (fourteen) days.     Facility-Administered Medications Ordered in Other Visits  Medication Dose  Route Frequency Provider Last Rate Last Admin  . acetaminophen (TYLENOL) tablet 650 mg  650 mg  Oral Q4H PRN Shalhoub, Sherryll Burger, MD       Or  . acetaminophen (TYLENOL) 160 MG/5ML solution 650 mg  650 mg Per Tube Q4H PRN Shalhoub, Sherryll Burger, MD       Or  . acetaminophen (TYLENOL) suppository 650 mg  650 mg Rectal Q4H PRN Shalhoub, Sherryll Burger, MD      . Derrill Memo ON 12/22/2020] atorvastatin (LIPITOR) tablet 40 mg  40 mg Oral Daily Rosalin Hawking, MD      . azithromycin (ZITHROMAX) 500 mg in sodium chloride 0.9 % 250 mL IVPB  500 mg Intravenous Q24H Shalhoub, Sherryll Burger, MD   Stopped at 12/21/20 [phone]  . cefTRIAXone (ROCEPHIN) 2 g in sodium chloride 0.9 % 100 mL IVPB  2 g Intravenous Q24H Shalhoub, Sherryll Burger, MD      . fluticasone (FLONASE) 50 MCG/ACT nasal spray 2 spray  2 spray Each Nare QHS Shalhoub, Sherryll Burger, MD      . hydrALAZINE (APRESOLINE) injection 10 mg  10 mg Intravenous Q6H PRN Shalhoub, Sherryll Burger, MD      . ipratropium-albuterol (DUONEB) 0.5-2.5 (3) MG/3ML nebulizer solution 3 mL  3 mL Nebulization Q4H PRN Shalhoub, Sherryll Burger, MD      . metoprolol tartrate (LOPRESSOR) tablet 25 mg  25 mg Oral BID Vernelle Emerald, MD      . ondansetron [facility]) injection 4 mg  4 mg Intravenous Q6H PRN Shalhoub, Sherryll Burger, MD      . pantoprazole (PROTONIX) EC tablet 40 mg  40 mg Oral Daily Shalhoub, Sherryll Burger, MD      . polyethylene glycol (MIRALAX / GLYCOLAX) packet 17 g  17 g Oral Daily PRN Alekh, Kshitiz, MD      . senna-docusate (Senokot-S) tablet 1 tablet  1 tablet Oral Daily PRN Aline August, MD      . traMADol Veatrice Bourbon) tablet 50 mg  50 mg Oral Q6H PRN Shalhoub, Sherryll Burger, MD         ASSESSMENT & PLAN:   Assessment: 1. Essential thrombocythemia previously treated with hydroxyurea.  Due to low normal platelets, hydroxyurea was placed on hold in October 2021.  The platelets remain low normal.  We have been following her more closely due to concerns that she may be transforming into myelofibrosis or acute  leukemia.  2. Leukocytosis/neutrophilia, which she has had on and off since 2018.  This is felt to be due to her myeloproliferative disease, but has increased in the last 2 months.  This is concerning for transformation to myelofibrosis, so we will schedule her for bone marrow biopsy.  3. Worsening anemia, which is now normochromic and normocytic. Repeat nutritional studies revealed B12 to be low normal, so we will have her start an oral B12 supplement.  Iron studies and folate were normal..  Unfortunately, this is also concerning for transformation to myelofibrosis.  4. History of vulvar cancer, treated with surgical excision.  5. Right upper lobe nodule on CT chest seen.  Pulmonology is following, but CT imaging in July 2021 revealed stable pulmonary nodules.  6. History of melanoma of the great toe, treated with surgical excision.  She remains without evidence of recurrence  7. Chronic back pain, which is being managed with narcotics by Dr. Maryjean Ka.   8. Status post CABG x4 in March 2021.  9. Status post CVA x2 in March and April.  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  10. New calcifications in the  right breast in March 2021 warranting further evaluation.  Unilateral diagnostic mammogram from July characterized these as likely benign. She is overdue for bilateral diagnostic mammogram at this time.  She would like to delay this for now.  11. Apparent iron deficiency, for which she has been on oral supplementation, with worsening anemia.   12. Chronic kidney disease, which remains stable.   13. Increased back pain after a fall.  The patient declines an x-ray today.   Plan:   As she has worsening leukocytosis and anemia at this time, we do recommend bone marrow biopsy.  We will arrange for this through Interventional Radiology. We will plan to see her back when that is complete with a repeat CBC and comprehensive  metabolic panel. The patient understands the plans discussed today and is in agreement with them.    Marvia Pickles, PA-C

## 2020-12-19 ENCOUNTER — Telehealth: Payer: Self-pay

## 2020-12-19 NOTE — Telephone Encounter (Signed)
-----   Message from Kelli A Mosher, PA-C sent at 12/19/2020  1:00 PM EDT ----- Please tell her vitamin B12 was low normal, so I would recommend she start on oral B12, 1000 mcg/d. Thanks  

## 2020-12-20 ENCOUNTER — Emergency Department (HOSPITAL_COMMUNITY): Payer: PPO

## 2020-12-20 ENCOUNTER — Inpatient Hospital Stay (HOSPITAL_COMMUNITY)
Admission: EM | Admit: 2020-12-20 | Discharge: 2020-12-29 | DRG: 085 | Disposition: A | Discharge status: Skilled Nursing Facility | Payer: PPO | Attending: Internal Medicine | Admitting: Internal Medicine

## 2020-12-20 ENCOUNTER — Encounter (HOSPITAL_COMMUNITY): Payer: Self-pay

## 2020-12-20 ENCOUNTER — Observation Stay (HOSPITAL_COMMUNITY): Payer: PPO

## 2020-12-20 DIAGNOSIS — Z20822 Contact with and (suspected) exposure to covid-19: Secondary | ICD-10-CM | POA: Diagnosis present

## 2020-12-20 DIAGNOSIS — J440 Chronic obstructive pulmonary disease with acute lower respiratory infection: Secondary | ICD-10-CM | POA: Diagnosis not present

## 2020-12-20 DIAGNOSIS — S065X9D Traumatic subdural hemorrhage with loss of consciousness of unspecified duration, subsequent encounter: Secondary | ICD-10-CM | POA: Diagnosis not present

## 2020-12-20 DIAGNOSIS — G4733 Obstructive sleep apnea (adult) (pediatric): Secondary | ICD-10-CM | POA: Diagnosis present

## 2020-12-20 DIAGNOSIS — S065X9A Traumatic subdural hemorrhage with loss of consciousness of unspecified duration, initial encounter: Secondary | ICD-10-CM | POA: Diagnosis not present

## 2020-12-20 DIAGNOSIS — R0902 Hypoxemia: Secondary | ICD-10-CM | POA: Diagnosis not present

## 2020-12-20 DIAGNOSIS — J159 Unspecified bacterial pneumonia: Secondary | ICD-10-CM | POA: Diagnosis not present

## 2020-12-20 DIAGNOSIS — R161 Splenomegaly, not elsewhere classified: Secondary | ICD-10-CM | POA: Diagnosis not present

## 2020-12-20 DIAGNOSIS — K219 Gastro-esophageal reflux disease without esophagitis: Secondary | ICD-10-CM | POA: Diagnosis present

## 2020-12-20 DIAGNOSIS — S065X0A Traumatic subdural hemorrhage without loss of consciousness, initial encounter: Principal | ICD-10-CM | POA: Diagnosis present

## 2020-12-20 DIAGNOSIS — M1712 Unilateral primary osteoarthritis, left knee: Secondary | ICD-10-CM | POA: Diagnosis not present

## 2020-12-20 DIAGNOSIS — D638 Anemia in other chronic diseases classified elsewhere: Secondary | ICD-10-CM | POA: Diagnosis not present

## 2020-12-20 DIAGNOSIS — Z951 Presence of aortocoronary bypass graft: Secondary | ICD-10-CM

## 2020-12-20 DIAGNOSIS — R519 Headache, unspecified: Secondary | ICD-10-CM

## 2020-12-20 DIAGNOSIS — I639 Cerebral infarction, unspecified: Secondary | ICD-10-CM | POA: Diagnosis not present

## 2020-12-20 DIAGNOSIS — G9389 Other specified disorders of brain: Secondary | ICD-10-CM | POA: Diagnosis not present

## 2020-12-20 DIAGNOSIS — E538 Deficiency of other specified B group vitamins: Secondary | ICD-10-CM | POA: Diagnosis not present

## 2020-12-20 DIAGNOSIS — Z823 Family history of stroke: Secondary | ICD-10-CM

## 2020-12-20 DIAGNOSIS — R29700 NIHSS score 0: Secondary | ICD-10-CM | POA: Diagnosis not present

## 2020-12-20 DIAGNOSIS — I2510 Atherosclerotic heart disease of native coronary artery without angina pectoris: Secondary | ICD-10-CM | POA: Diagnosis not present

## 2020-12-20 DIAGNOSIS — F419 Anxiety disorder, unspecified: Secondary | ICD-10-CM | POA: Diagnosis not present

## 2020-12-20 DIAGNOSIS — I700 Atherosclerosis of aorta: Secondary | ICD-10-CM | POA: Diagnosis not present

## 2020-12-20 DIAGNOSIS — D473 Essential (hemorrhagic) thrombocythemia: Secondary | ICD-10-CM | POA: Diagnosis not present

## 2020-12-20 DIAGNOSIS — D469 Myelodysplastic syndrome, unspecified: Secondary | ICD-10-CM | POA: Diagnosis present

## 2020-12-20 DIAGNOSIS — Z9889 Other specified postprocedural states: Secondary | ICD-10-CM | POA: Diagnosis not present

## 2020-12-20 DIAGNOSIS — I6340 Cerebral infarction due to embolism of unspecified cerebral artery: Secondary | ICD-10-CM

## 2020-12-20 DIAGNOSIS — E782 Mixed hyperlipidemia: Secondary | ICD-10-CM | POA: Diagnosis present

## 2020-12-20 DIAGNOSIS — S0990XA Unspecified injury of head, initial encounter: Secondary | ICD-10-CM | POA: Diagnosis not present

## 2020-12-20 DIAGNOSIS — M549 Dorsalgia, unspecified: Secondary | ICD-10-CM | POA: Diagnosis not present

## 2020-12-20 DIAGNOSIS — N390 Urinary tract infection, site not specified: Secondary | ICD-10-CM | POA: Diagnosis not present

## 2020-12-20 DIAGNOSIS — R52 Pain, unspecified: Secondary | ICD-10-CM | POA: Diagnosis not present

## 2020-12-20 DIAGNOSIS — R911 Solitary pulmonary nodule: Secondary | ICD-10-CM | POA: Diagnosis not present

## 2020-12-20 DIAGNOSIS — K5900 Constipation, unspecified: Secondary | ICD-10-CM | POA: Diagnosis not present

## 2020-12-20 DIAGNOSIS — Z8249 Family history of ischemic heart disease and other diseases of the circulatory system: Secondary | ICD-10-CM

## 2020-12-20 DIAGNOSIS — D696 Thrombocytopenia, unspecified: Secondary | ICD-10-CM | POA: Diagnosis not present

## 2020-12-20 DIAGNOSIS — I509 Heart failure, unspecified: Secondary | ICD-10-CM | POA: Diagnosis not present

## 2020-12-20 DIAGNOSIS — Z825 Family history of asthma and other chronic lower respiratory diseases: Secondary | ICD-10-CM

## 2020-12-20 DIAGNOSIS — Z66 Do not resuscitate: Secondary | ICD-10-CM | POA: Diagnosis present

## 2020-12-20 DIAGNOSIS — I6381 Other cerebral infarction due to occlusion or stenosis of small artery: Secondary | ICD-10-CM | POA: Diagnosis present

## 2020-12-20 DIAGNOSIS — R296 Repeated falls: Secondary | ICD-10-CM | POA: Diagnosis not present

## 2020-12-20 DIAGNOSIS — D72829 Elevated white blood cell count, unspecified: Secondary | ICD-10-CM | POA: Diagnosis not present

## 2020-12-20 DIAGNOSIS — Z881 Allergy status to other antibiotic agents status: Secondary | ICD-10-CM | POA: Diagnosis not present

## 2020-12-20 DIAGNOSIS — L853 Xerosis cutis: Secondary | ICD-10-CM | POA: Diagnosis not present

## 2020-12-20 DIAGNOSIS — Z85820 Personal history of malignant melanoma of skin: Secondary | ICD-10-CM

## 2020-12-20 DIAGNOSIS — J189 Pneumonia, unspecified organism: Secondary | ICD-10-CM

## 2020-12-20 DIAGNOSIS — S065XAA Traumatic subdural hemorrhage with loss of consciousness status unknown, initial encounter: Secondary | ICD-10-CM

## 2020-12-20 DIAGNOSIS — I720 Aneurysm of carotid artery: Secondary | ICD-10-CM | POA: Diagnosis not present

## 2020-12-20 DIAGNOSIS — J449 Chronic obstructive pulmonary disease, unspecified: Secondary | ICD-10-CM | POA: Diagnosis not present

## 2020-12-20 DIAGNOSIS — R5383 Other fatigue: Secondary | ICD-10-CM | POA: Diagnosis not present

## 2020-12-20 DIAGNOSIS — M1711 Unilateral primary osteoarthritis, right knee: Secondary | ICD-10-CM | POA: Diagnosis not present

## 2020-12-20 DIAGNOSIS — R402252 Coma scale, best verbal response, oriented, at arrival to emergency department: Secondary | ICD-10-CM | POA: Diagnosis present

## 2020-12-20 DIAGNOSIS — R2681 Unsteadiness on feet: Secondary | ICD-10-CM

## 2020-12-20 DIAGNOSIS — W19XXXD Unspecified fall, subsequent encounter: Secondary | ICD-10-CM | POA: Diagnosis not present

## 2020-12-20 DIAGNOSIS — E559 Vitamin D deficiency, unspecified: Secondary | ICD-10-CM | POA: Diagnosis not present

## 2020-12-20 DIAGNOSIS — Z880 Allergy status to penicillin: Secondary | ICD-10-CM | POA: Diagnosis not present

## 2020-12-20 DIAGNOSIS — J439 Emphysema, unspecified: Secondary | ICD-10-CM | POA: Diagnosis not present

## 2020-12-20 DIAGNOSIS — I6389 Other cerebral infarction: Secondary | ICD-10-CM | POA: Diagnosis not present

## 2020-12-20 DIAGNOSIS — Z743 Need for continuous supervision: Secondary | ICD-10-CM | POA: Diagnosis not present

## 2020-12-20 DIAGNOSIS — I10 Essential (primary) hypertension: Secondary | ICD-10-CM | POA: Diagnosis present

## 2020-12-20 DIAGNOSIS — Z8673 Personal history of transient ischemic attack (TIA), and cerebral infarction without residual deficits: Secondary | ICD-10-CM | POA: Diagnosis not present

## 2020-12-20 DIAGNOSIS — R0600 Dyspnea, unspecified: Secondary | ICD-10-CM | POA: Diagnosis not present

## 2020-12-20 DIAGNOSIS — Z87412 Personal history of vulvar dysplasia: Secondary | ICD-10-CM

## 2020-12-20 DIAGNOSIS — Z79899 Other long term (current) drug therapy: Secondary | ICD-10-CM

## 2020-12-20 DIAGNOSIS — Y92009 Unspecified place in unspecified non-institutional (private) residence as the place of occurrence of the external cause: Secondary | ICD-10-CM | POA: Diagnosis not present

## 2020-12-20 DIAGNOSIS — R402362 Coma scale, best motor response, obeys commands, at arrival to emergency department: Secondary | ICD-10-CM | POA: Diagnosis present

## 2020-12-20 DIAGNOSIS — W19XXXA Unspecified fall, initial encounter: Secondary | ICD-10-CM | POA: Diagnosis present

## 2020-12-20 DIAGNOSIS — I6200 Nontraumatic subdural hemorrhage, unspecified: Secondary | ICD-10-CM | POA: Diagnosis not present

## 2020-12-20 DIAGNOSIS — K5750 Diverticulosis of both small and large intestine without perforation or abscess without bleeding: Secondary | ICD-10-CM | POA: Diagnosis not present

## 2020-12-20 DIAGNOSIS — Z9181 History of falling: Secondary | ICD-10-CM | POA: Diagnosis not present

## 2020-12-20 DIAGNOSIS — J309 Allergic rhinitis, unspecified: Secondary | ICD-10-CM | POA: Diagnosis not present

## 2020-12-20 DIAGNOSIS — R402142 Coma scale, eyes open, spontaneous, at arrival to emergency department: Secondary | ICD-10-CM | POA: Diagnosis present

## 2020-12-20 DIAGNOSIS — I708 Atherosclerosis of other arteries: Secondary | ICD-10-CM | POA: Diagnosis not present

## 2020-12-20 HISTORY — DX: Pneumonia, unspecified organism: J18.9

## 2020-12-20 HISTORY — DX: Traumatic subdural hemorrhage with loss of consciousness status unknown, initial encounter: S06.5XAA

## 2020-12-20 HISTORY — DX: Traumatic subdural hemorrhage with loss of consciousness of unspecified duration, initial encounter: S06.5X9A

## 2020-12-20 LAB — COMPREHENSIVE METABOLIC PANEL
ALT: 11 U/L (ref 0–44)
AST: 17 U/L (ref 15–41)
Albumin: 3.3 g/dL — ABNORMAL LOW (ref 3.5–5.0)
Alkaline Phosphatase: 73 U/L (ref 38–126)
Anion gap: 11 (ref 5–15)
BUN: 15 mg/dL (ref 8–23)
CO2: 23 mmol/L (ref 22–32)
Calcium: 9 mg/dL (ref 8.9–10.3)
Chloride: 104 mmol/L (ref 98–111)
Creatinine, Ser: 0.96 mg/dL (ref 0.44–1.00)
GFR, Estimated: 60 mL/min — ABNORMAL LOW (ref >60)
Glucose, Bld: 99 mg/dL (ref 70–99)
Potassium: 4.5 mmol/L (ref 3.5–5.1)
Sodium: 138 mmol/L (ref 135–145)
Total Bilirubin: 0.8 mg/dL (ref 0.3–1.2)
Total Protein: 7.6 g/dL (ref 6.5–8.1)

## 2020-12-20 LAB — CBC WITH DIFFERENTIAL/PLATELET
Abs Immature Granulocytes: 0 10*3/uL (ref 0.00–0.07)
Basophils Absolute: 0 10*3/uL (ref 0.0–0.1)
Basophils Relative: 0 %
Eosinophils Absolute: 0.6 10*3/uL — ABNORMAL HIGH (ref 0.0–0.5)
Eosinophils Relative: 2 %
HCT: 33.8 % — ABNORMAL LOW (ref 36.0–46.0)
Hemoglobin: 10.3 g/dL — ABNORMAL LOW (ref 12.0–15.0)
Lymphocytes Relative: 6 %
Lymphs Abs: 1.7 10*3/uL (ref 0.7–4.0)
MCH: 30.5 pg (ref 26.0–34.0)
MCHC: 30.5 g/dL (ref 30.0–36.0)
MCV: 100 fL (ref 80.0–100.0)
Monocytes Absolute: 0.3 10*3/uL (ref 0.1–1.0)
Monocytes Relative: 1 %
Neutro Abs: 25.7 10*3/uL — ABNORMAL HIGH (ref 1.7–7.7)
Neutrophils Relative %: 91 %
Platelets: 172 10*3/uL (ref 150–400)
RBC: 3.38 MIL/uL — ABNORMAL LOW (ref 3.87–5.11)
RDW: 16.6 % — ABNORMAL HIGH (ref 11.5–15.5)
WBC: 28.2 10*3/uL — ABNORMAL HIGH (ref 4.0–10.5)
nRBC: 0 /100 WBC
nRBC: 0.1 % (ref 0.0–0.2)

## 2020-12-20 LAB — BRAIN NATRIURETIC PEPTIDE: B Natriuretic Peptide: 400.9 pg/mL — ABNORMAL HIGH (ref 0.0–100.0)

## 2020-12-20 LAB — RESP PANEL BY RT-PCR (FLU A&B, COVID) ARPGX2
Influenza A by PCR: NEGATIVE
Influenza B by PCR: NEGATIVE
SARS Coronavirus 2 by RT PCR: NEGATIVE

## 2020-12-20 LAB — TROPONIN I (HIGH SENSITIVITY)
Troponin I (High Sensitivity): 242 ng/L (ref <18)
Troponin I (High Sensitivity): 233 ng/L (ref <18)

## 2020-12-20 LAB — CK: Total CK: 115 U/L (ref 38–234)

## 2020-12-20 LAB — LACTIC ACID, PLASMA
Lactic Acid, Venous: 1.2 mmol/L (ref 0.5–1.9)
Lactic Acid, Venous: 1.1 mmol/L (ref 0.5–1.9)

## 2020-12-20 LAB — PROTIME-INR
INR: 1.2 (ref 0.8–1.2)
Prothrombin Time: 15.4 seconds — ABNORMAL HIGH (ref 11.4–15.2)

## 2020-12-20 LAB — TSH: TSH: 1.211 u[IU]/mL (ref 0.350–4.500)

## 2020-12-20 IMAGING — DX DG CHEST 2V
2 series · 2 of 2 positions shown · non-contrast
Comparison: [DATE]

CLINICAL DATA: Dyspnea, fatigue, congestive heart failure

EXAM:
CHEST - 2 VIEW

[chest lat]
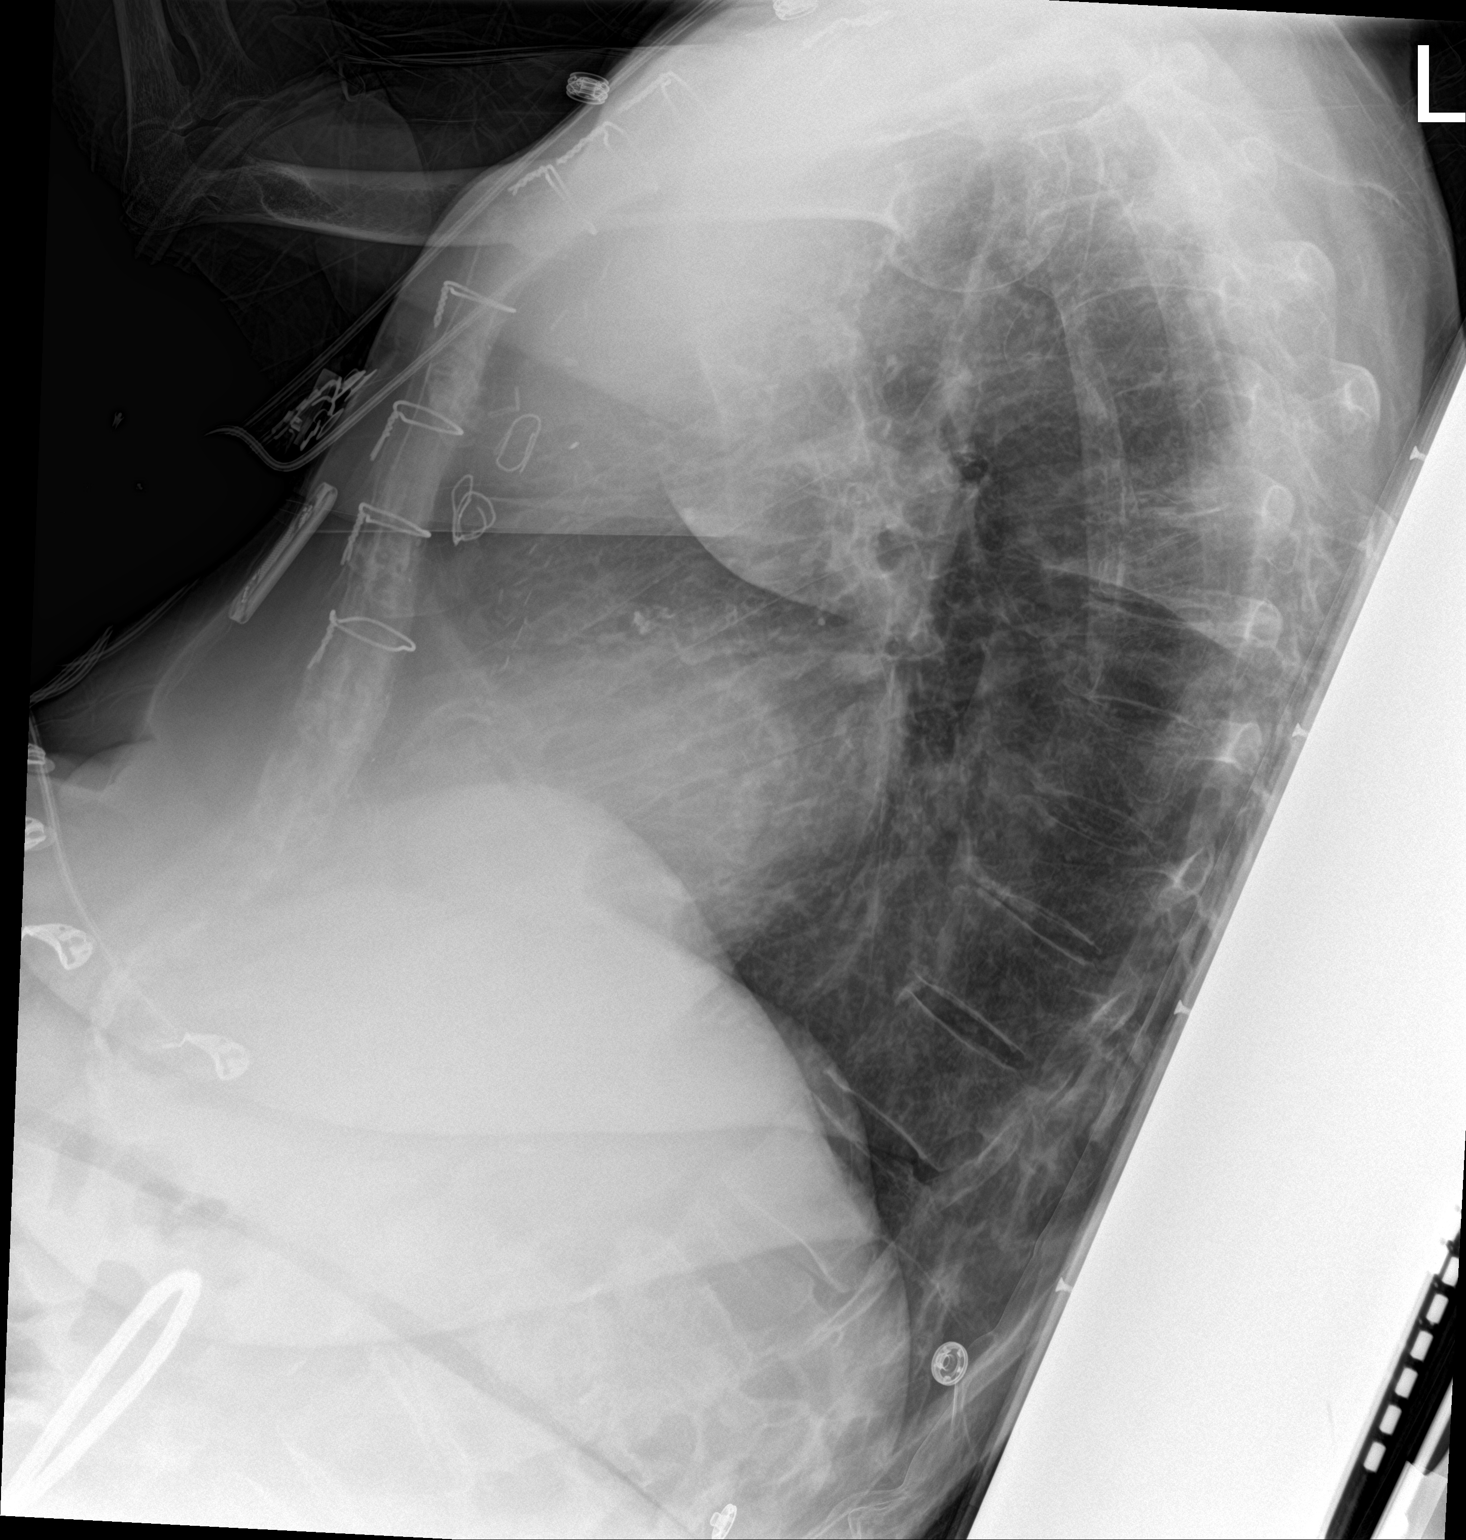

[chest ap]
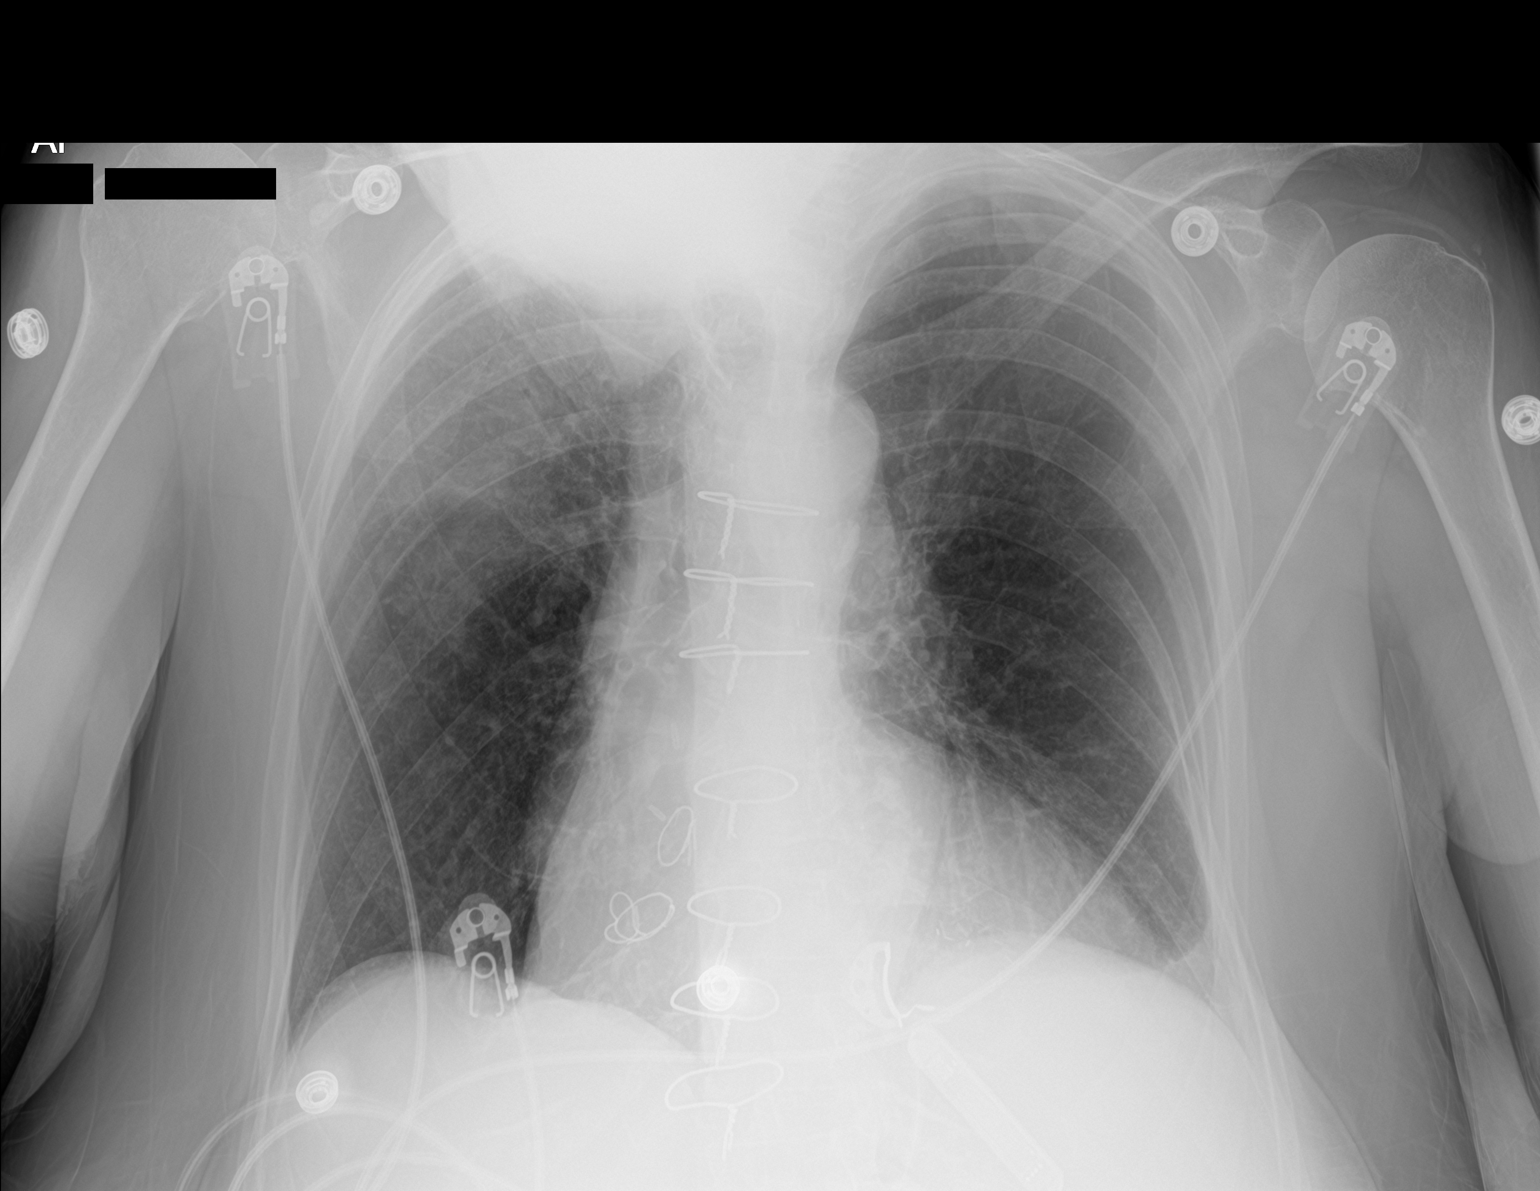

[2 of 2 positions shown; findings below may reference images not displayed]

FINDINGS: There is focal consolidation within the posterior right upper lobe,
likely infectious in the acute setting. The chin overlies the lung
apices, however, no definite pneumothorax. No pleural effusion.
Cardiac size is mildly enlarged. Coronary artery bypass grafting has
been performed. The pulmonary vascularity is normal. No acute bone
abnormality.
IMPRESSION: Right upper lobe consolidation in keeping with acute lobar pneumonia
in the appropriate clinical setting. Follow-up chest radiograph is
recommended in 3-4 weeks to document resolution and exclude the
presence of a central obstructing lesion.

## 2020-12-20 IMAGING — CT CT ANGIO HEAD
2 of 11 series · 6 of 34 positions shown · IV contrast (APPLIED)
Comparison: Brain MRI [DATE]. CT angiogram head/neck
[DATE].

CLINICAL DATA: Neuro deficit, acute, stroke suspected; history of
intracerebral aneurysm, headache for several days, multiple falls
today, pain in neck, rule out bleed or aneurysmal change.

EXAM:
CT ANGIOGRAPHY HEAD AND NECK
TECHNIQUE: Multidetector CT imaging of the head and neck was performed using
the standard protocol during bolus administration of intravenous
contrast. Multiplanar CT image reconstructions and MIPs were
obtained to evaluate the vascular anatomy. Carotid stenosis
measurements (when applicable) are obtained utilizing NASCET
criteria, using the distal internal carotid diameter as the
denominator.
CONTRAST:  75mL OMNIPAQUE IOHEXOL 350 MG/ML SOLN

[Series 10: sag soft · sagittal · 0.37mm/px · 1 of 57 slices shown]
[im 5/57  soft-tissue]
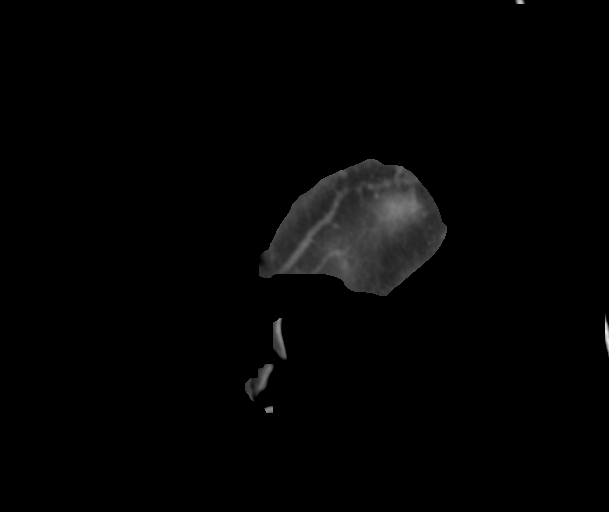

[Series 11: ax thins (person_name) · axial · 0.39mm/px · z∈[+891,+1131]mm · 5 of 360 slices shown]
[im 60/360  soft-tissue]
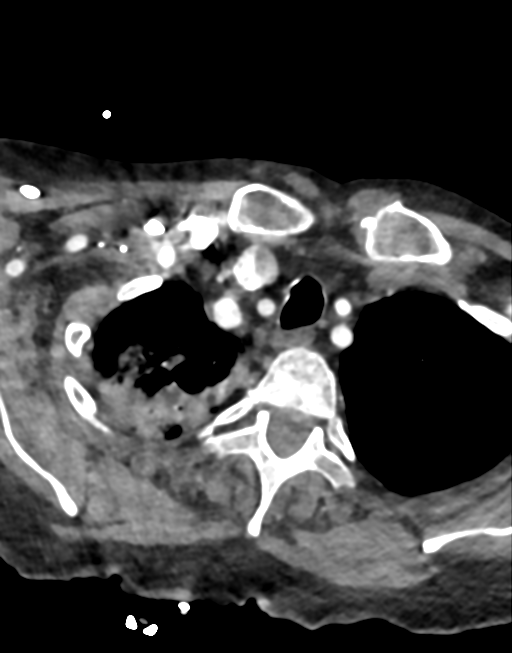
[im 120/360  bone]
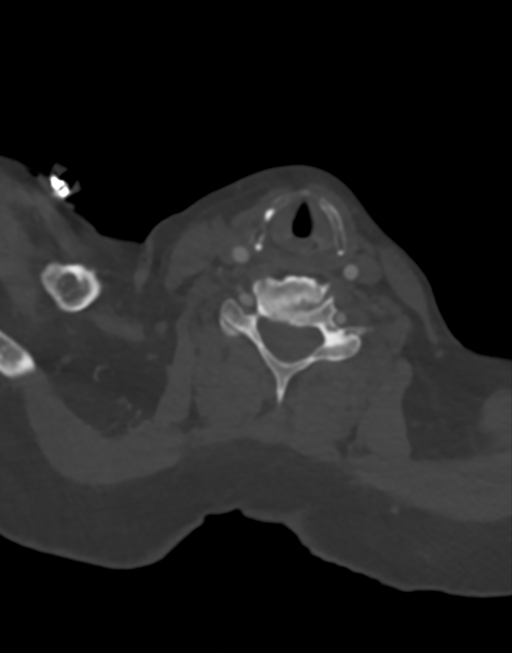
[im 180/360  soft-tissue]
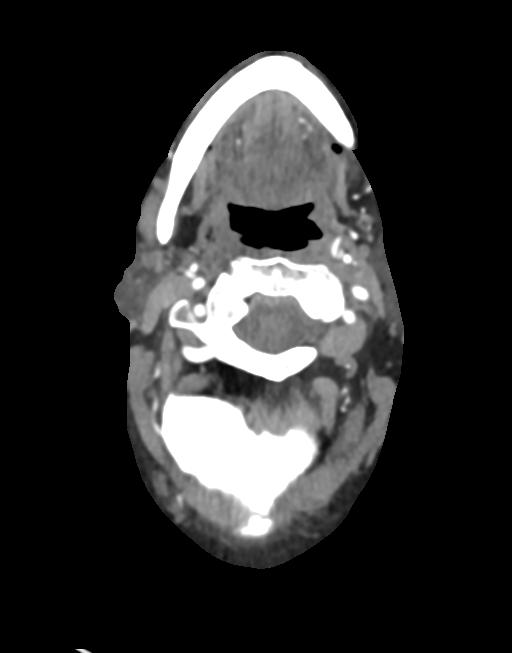
[im 240/360  bone]
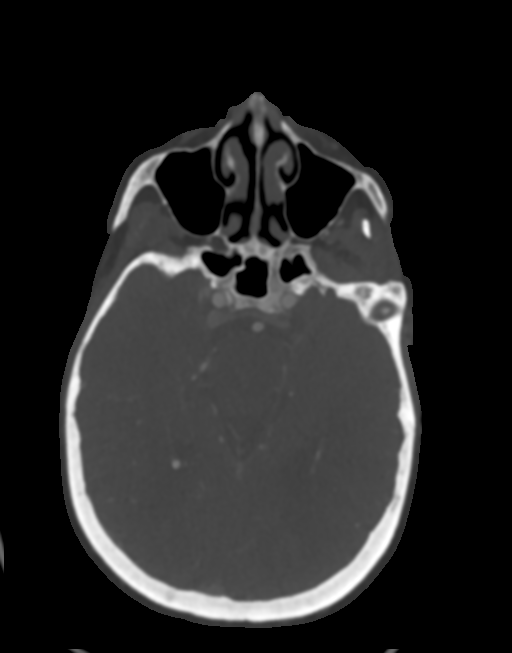
[im 300/360  soft-tissue]
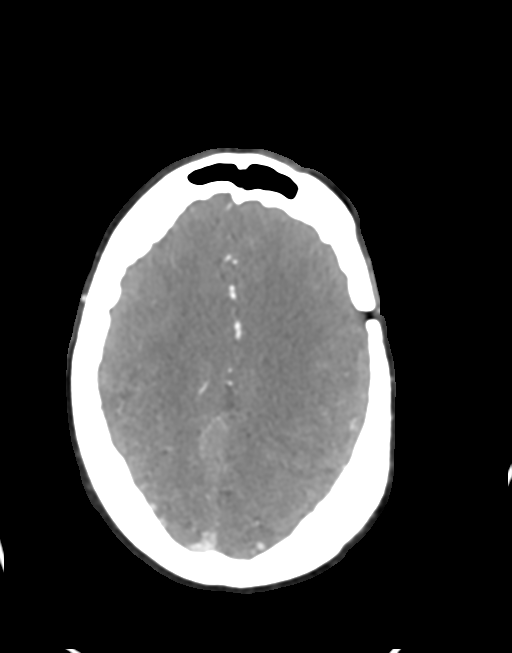

[6 of 34 positions shown; findings below may reference images not displayed]

FINDINGS: CT HEAD FINDINGS

Brain:

Mild cerebral and cerebellar atrophy.

Acute parafalcine subdural hematoma measuring up to 10 mm in
greatest thickness (series 5, image 11).

Unchanged size of a thin chronic subdural hematoma overlying the
left cerebral hemisphere, measuring up to 4 mm, deep to the
left-sided cranioplasty.

Subacute infarct within the left thalamus. Additional known small
subacute infarcts within the supratentorial and infratentorial brain
were better appreciated on the prior brain MRI of [DATE].

Redemonstrated small chronic right parietal lobe cortical infarct.

Advanced patchy and ill-defined hypoattenuation within the cerebral
white matter, nonspecific but compatible with chronic small vessel
ischemic disease.

No acute demarcated cortical infarct.

No evidence of intracranial mass.

No midline shift.

Vascular: No hyperdense vessel.  Atherosclerotic calcifications

Skull: No calvarial fracture.  Left-sided cranioplasty.

Sinuses: Scattered trace mucosal thickening and fluid within the
bilateral ethmoid air cells. Small right maxillary sinus mucous
retention cyst.

Orbits: No mass or acute finding.

Review of the MIP images confirms the above findings

CTA NECK FINDINGS

Aortic arch: Standard aortic branching. Atherosclerotic plaque
within the visualized aortic arch and proximal major branch vessels
of the neck. No hemodynamically significant innominate or proximal
subclavian artery stenosis.

Right carotid system: CCA and ICA patent within the neck without
significant stenosis (50% or greater). Mild calcified plaque within
the carotid bifurcation and proximal ICA.

Left carotid system: CCA and ICA patent within the neck without
significant stenosis (50% or greater). Mild soft and calcified
plaque within the carotid bifurcation and proximal ICA.

Vertebral arteries: Codominant patent within the neck without
stenosis. Mild nonstenotic calcified plaque at the origin of the
right vertebral artery.

Skeleton: No acute bony abnormality or aggressive osseous lesion.
Cervical spondylosis. Cervical levocurvature with partially imaged
thoracic dextrocurvature.

Other neck: Neck mass or cervical lymphadenopathy.

Upper chest: Consolidation within portions of the right upper lobe
and within imaged portions of the superior right lower lobe
compatible with pneumonia. A small ill-defined opacity also present
within the left lung apex likely reflecting an additional site of
pneumonia. Prior median sternotomy.

Review of the MIP images confirms the above findings

CTA HEAD FINDINGS

Anterior circulation:

The intracranial internal carotid arteries are patent. Plaque within
both vessels without stenosis. Unchanged 2 mm inferomedially
projecting aneurysm arising from the supraclinoid left ICA (series
12, image 122). The M1 middle cerebral arteries are patent. No M2
proximal branch occlusion or high-grade proximal stenosis is
identified. The anterior cerebral arteries are patent.

Posterior circulation:

The intracranial vertebral arteries are patent. The basilar artery
is patent. The posterior cerebral arteries are patent. Posterior
communicating arteries are hypoplastic or absent bilaterally.

Venous sinuses: Within the limitations of contrast timing, no
convincing thrombus.

Anatomic variants: As described

Review of the MIP images confirms the above findings

Acute CT head findings called by telephone at the time of
interpretation on [DATE] at [DATE] to provider STAMP
STAMP , who verbally acknowledged these results.
IMPRESSION: CT head:

1. Acute parafalcine subdural hematoma measuring up to 10 mm in
greatest thickness.
2. Unchanged thin chronic subdural hematoma overlying the left
cerebral hemisphere, deep to the left-sided cranioplasty (4 mm in
thickness).
3. Subacute infarct within the left thalamus. Additional known small
subacute infarcts within the supratentorial and infratentorial brain
were better appreciated on the prior brain MRI of [DATE].
[DATE]. Redemonstrated small chronic right parietal lobe cortical
infarct.
5. Stable parenchymal atrophy and severe cerebral white matter
chronic small vessel ischemic disease.
6. Mild paranasal sinus disease, as described.

CTA neck:

1. The common carotid, internal carotid and vertebral arteries are
patent within the neck without hemodynamically significant stenosis.
Atherosclerotic disease, as described.
2. Findings compatible right upper lobe, right lower lobe and left
upper lobe pneumonia. Right lower lobe pneumonia is incompletely
imaged.

CTA head:

1. No intracranial large vessel occlusion or proximal high-grade
arterial stenosis.
2. 2 mm aneurysm arising from the supraclinoid left ICA, unchanged
from the CTA of [DATE].

## 2020-12-20 IMAGING — CT CT ANGIO NECK
2 of 11 series · 6 of 34 positions shown · IV contrast (APPLIED)
Comparison: Brain MRI [DATE]. CT angiogram head/neck
[DATE].

CLINICAL DATA: Neuro deficit, acute, stroke suspected; history of
intracerebral aneurysm, headache for several days, multiple falls
today, pain in neck, rule out bleed or aneurysmal change.

EXAM:
CT ANGIOGRAPHY HEAD AND NECK
TECHNIQUE: Multidetector CT imaging of the head and neck was performed using
the standard protocol during bolus administration of intravenous
contrast. Multiplanar CT image reconstructions and MIPs were
obtained to evaluate the vascular anatomy. Carotid stenosis
measurements (when applicable) are obtained utilizing NASCET
criteria, using the distal internal carotid diameter as the
denominator.
CONTRAST:  75mL OMNIPAQUE IOHEXOL 350 MG/ML SOLN

[Series 10: sag soft · sagittal · 0.37mm/px · 1 of 57 slices shown]
[im 5/57  soft-tissue]
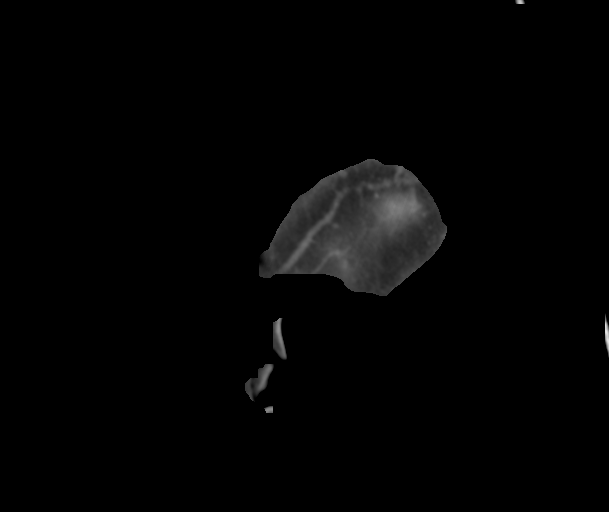

[Series 11: ax thins (person_name) · axial · 0.39mm/px · z∈[+891,+1131]mm · 5 of 360 slices shown]
[im 60/360  soft-tissue]
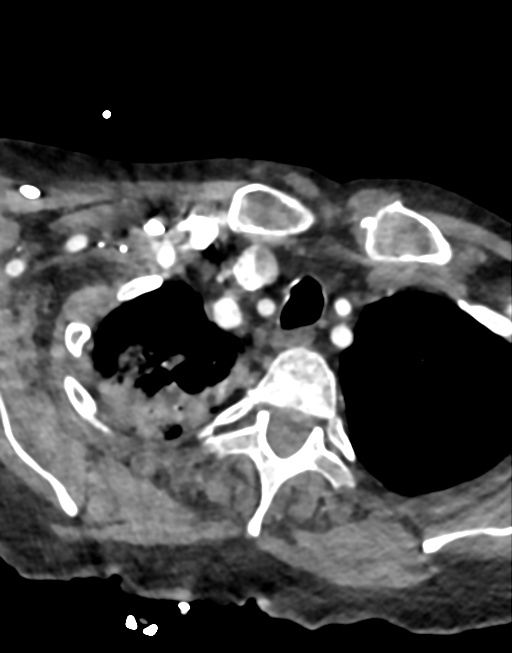
[im 120/360  bone]
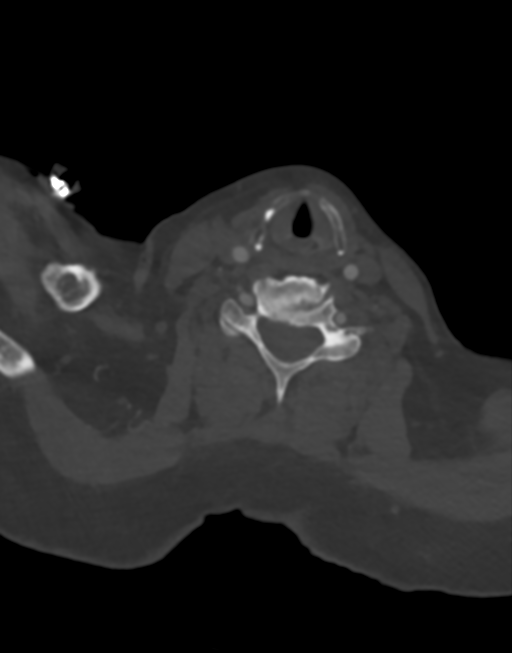
[im 180/360  soft-tissue]
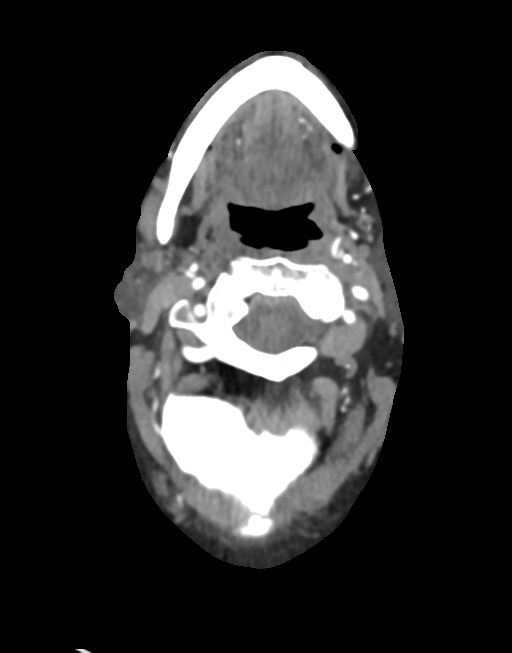
[im 240/360  bone]
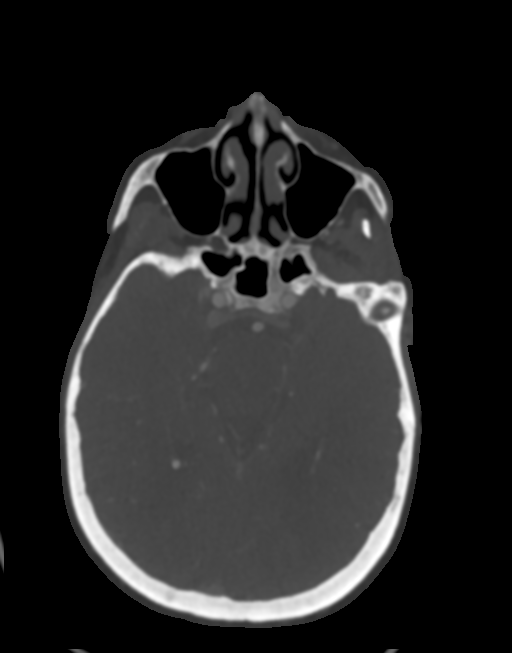
[im 300/360  soft-tissue]
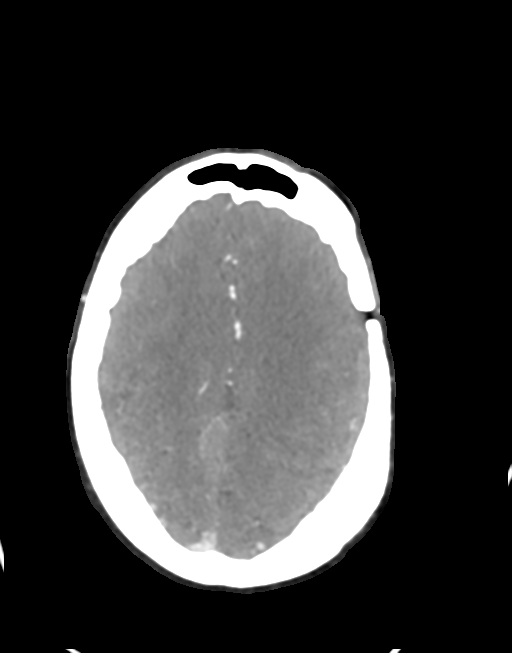

[6 of 34 positions shown; findings below may reference images not displayed]

FINDINGS: CT HEAD FINDINGS

Brain:

Mild cerebral and cerebellar atrophy.

Acute parafalcine subdural hematoma measuring up to 10 mm in
greatest thickness (series 5, image 11).

Unchanged size of a thin chronic subdural hematoma overlying the
left cerebral hemisphere, measuring up to 4 mm, deep to the
left-sided cranioplasty.

Subacute infarct within the left thalamus. Additional known small
subacute infarcts within the supratentorial and infratentorial brain
were better appreciated on the prior brain MRI of [DATE].

Redemonstrated small chronic right parietal lobe cortical infarct.

Advanced patchy and ill-defined hypoattenuation within the cerebral
white matter, nonspecific but compatible with chronic small vessel
ischemic disease.

No acute demarcated cortical infarct.

No evidence of intracranial mass.

No midline shift.

Vascular: No hyperdense vessel.  Atherosclerotic calcifications

Skull: No calvarial fracture.  Left-sided cranioplasty.

Sinuses: Scattered trace mucosal thickening and fluid within the
bilateral ethmoid air cells. Small right maxillary sinus mucous
retention cyst.

Orbits: No mass or acute finding.

Review of the MIP images confirms the above findings

CTA NECK FINDINGS

Aortic arch: Standard aortic branching. Atherosclerotic plaque
within the visualized aortic arch and proximal major branch vessels
of the neck. No hemodynamically significant innominate or proximal
subclavian artery stenosis.

Right carotid system: CCA and ICA patent within the neck without
significant stenosis (50% or greater). Mild calcified plaque within
the carotid bifurcation and proximal ICA.

Left carotid system: CCA and ICA patent within the neck without
significant stenosis (50% or greater). Mild soft and calcified
plaque within the carotid bifurcation and proximal ICA.

Vertebral arteries: Codominant patent within the neck without
stenosis. Mild nonstenotic calcified plaque at the origin of the
right vertebral artery.

Skeleton: No acute bony abnormality or aggressive osseous lesion.
Cervical spondylosis. Cervical levocurvature with partially imaged
thoracic dextrocurvature.

Other neck: Neck mass or cervical lymphadenopathy.

Upper chest: Consolidation within portions of the right upper lobe
and within imaged portions of the superior right lower lobe
compatible with pneumonia. A small ill-defined opacity also present
within the left lung apex likely reflecting an additional site of
pneumonia. Prior median sternotomy.

Review of the MIP images confirms the above findings

CTA HEAD FINDINGS

Anterior circulation:

The intracranial internal carotid arteries are patent. Plaque within
both vessels without stenosis. Unchanged 2 mm inferomedially
projecting aneurysm arising from the supraclinoid left ICA (series
12, image 122). The M1 middle cerebral arteries are patent. No M2
proximal branch occlusion or high-grade proximal stenosis is
identified. The anterior cerebral arteries are patent.

Posterior circulation:

The intracranial vertebral arteries are patent. The basilar artery
is patent. The posterior cerebral arteries are patent. Posterior
communicating arteries are hypoplastic or absent bilaterally.

Venous sinuses: Within the limitations of contrast timing, no
convincing thrombus.

Anatomic variants: As described

Review of the MIP images confirms the above findings

Acute CT head findings called by telephone at the time of
interpretation on [DATE] at [DATE] to provider STAMP
STAMP , who verbally acknowledged these results.
IMPRESSION: CT head:

1. Acute parafalcine subdural hematoma measuring up to 10 mm in
greatest thickness.
2. Unchanged thin chronic subdural hematoma overlying the left
cerebral hemisphere, deep to the left-sided cranioplasty (4 mm in
thickness).
3. Subacute infarct within the left thalamus. Additional known small
subacute infarcts within the supratentorial and infratentorial brain
were better appreciated on the prior brain MRI of [DATE].
[DATE]. Redemonstrated small chronic right parietal lobe cortical
infarct.
5. Stable parenchymal atrophy and severe cerebral white matter
chronic small vessel ischemic disease.
6. Mild paranasal sinus disease, as described.

CTA neck:

1. The common carotid, internal carotid and vertebral arteries are
patent within the neck without hemodynamically significant stenosis.
Atherosclerotic disease, as described.
2. Findings compatible right upper lobe, right lower lobe and left
upper lobe pneumonia. Right lower lobe pneumonia is incompletely
imaged.

CTA head:

1. No intracranial large vessel occlusion or proximal high-grade
arterial stenosis.
2. 2 mm aneurysm arising from the supraclinoid left ICA, unchanged
from the CTA of [DATE].

## 2020-12-20 IMAGING — DX DG KNEE COMPLETE 4+V*L*
4 series · 4 of 4 positions shown · non-contrast
Comparison: None.

CLINICAL DATA: Pain following fall

EXAM:
LEFT KNEE - COMPLETE 4+ VIEW

[knee ap]
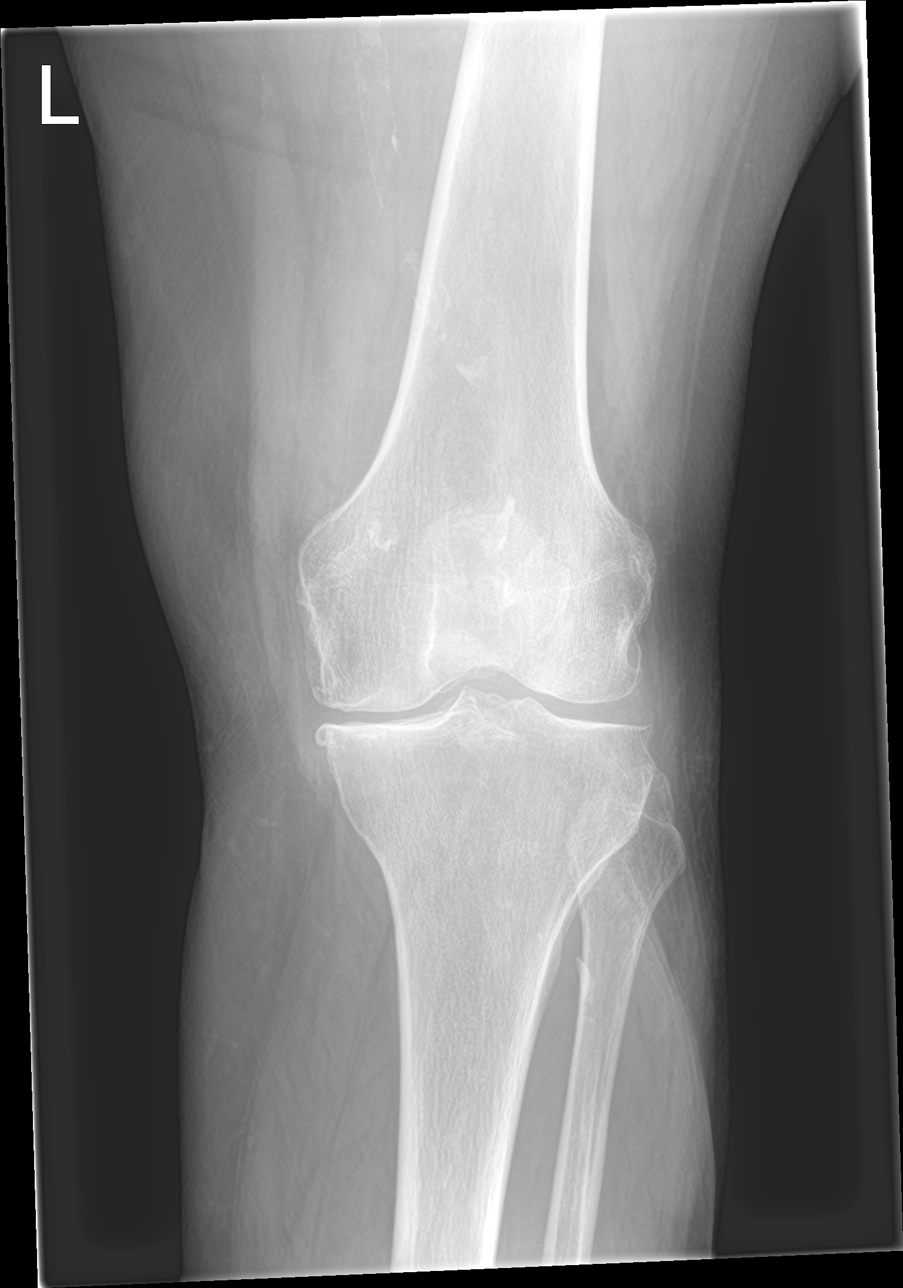

[knee lat]
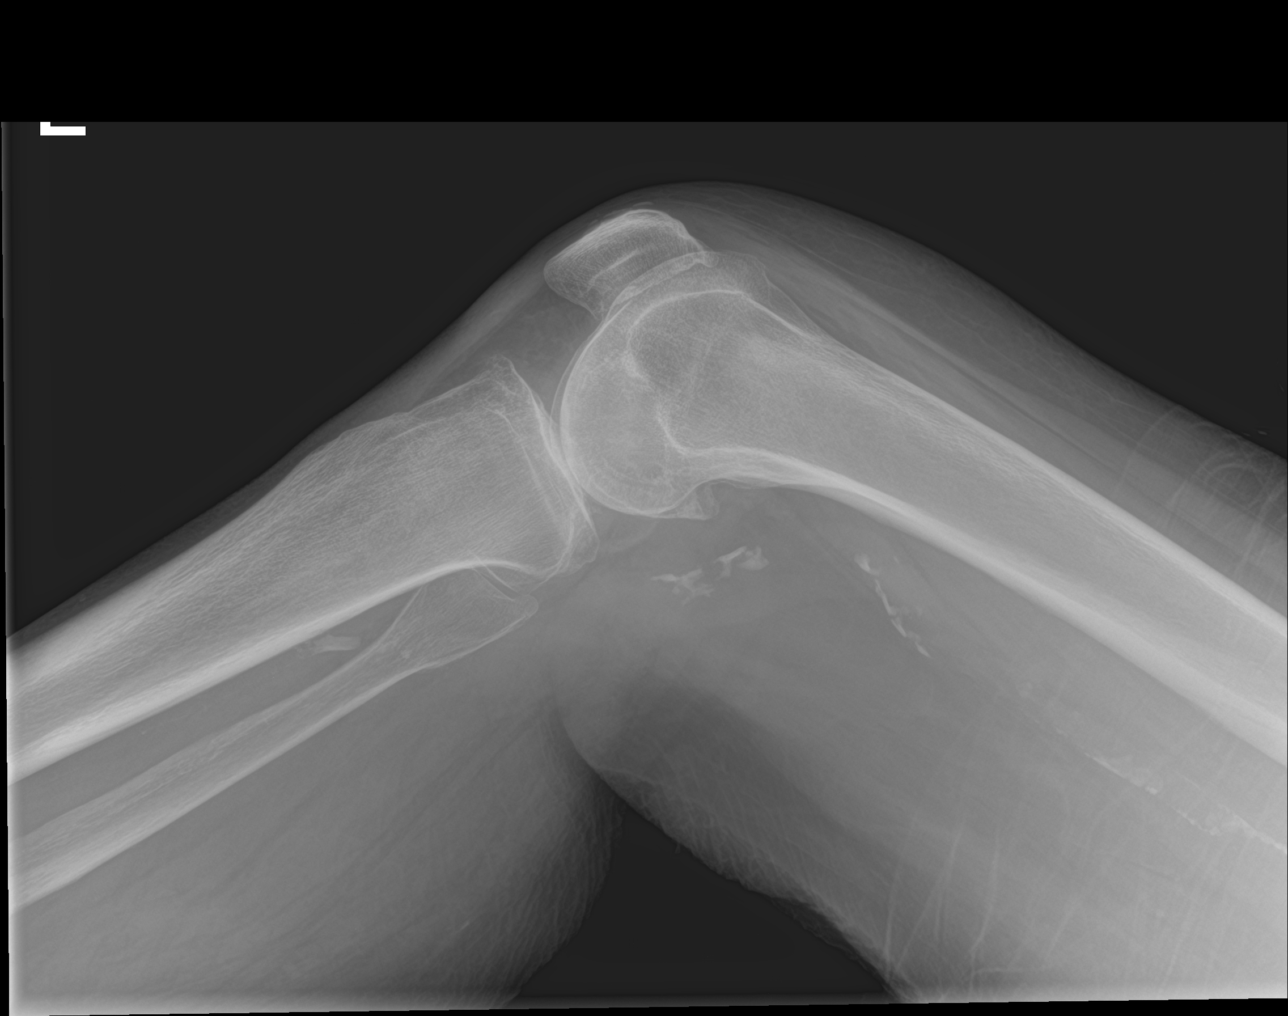

[knee obl (1 of 2)]
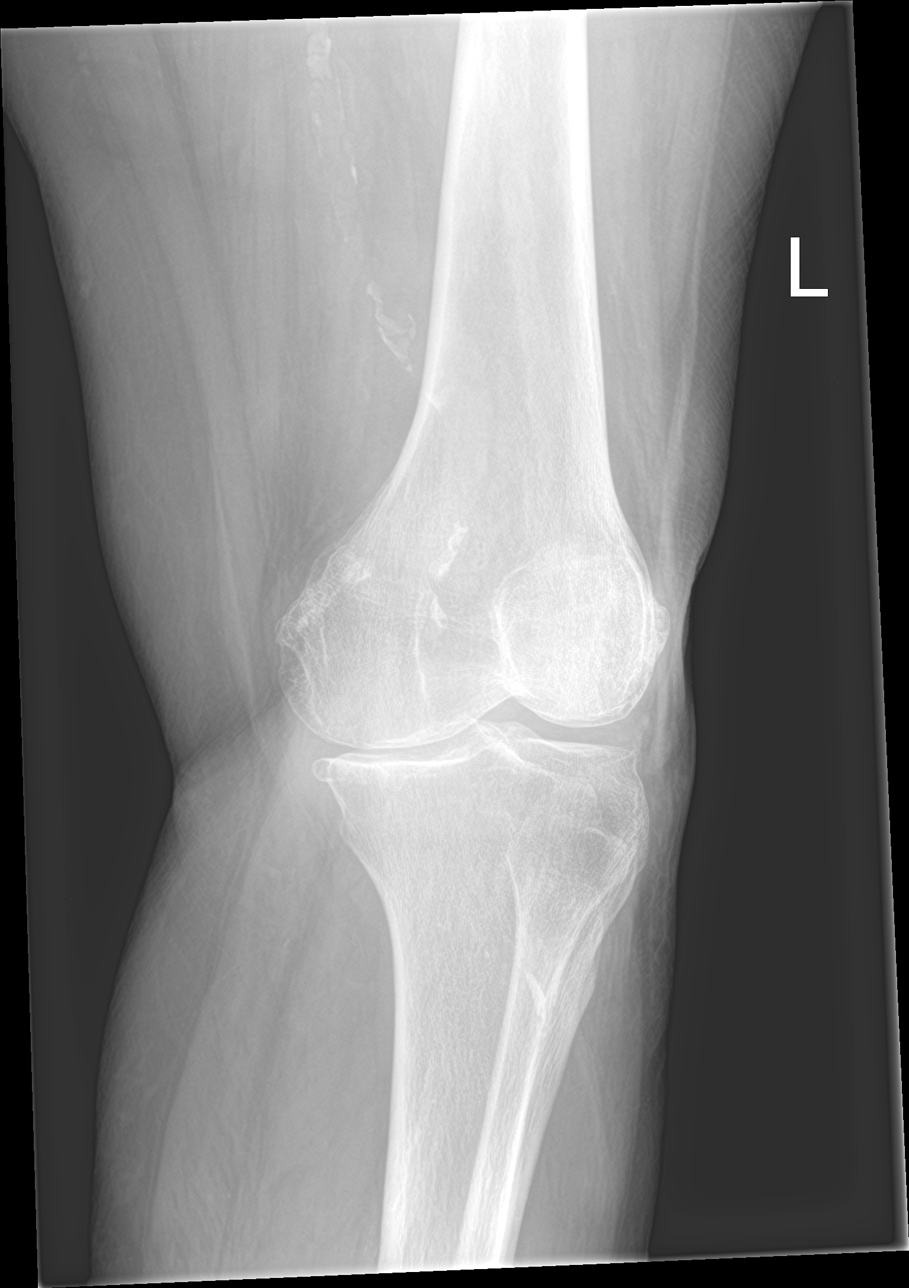

[knee obl (2 of 2)]
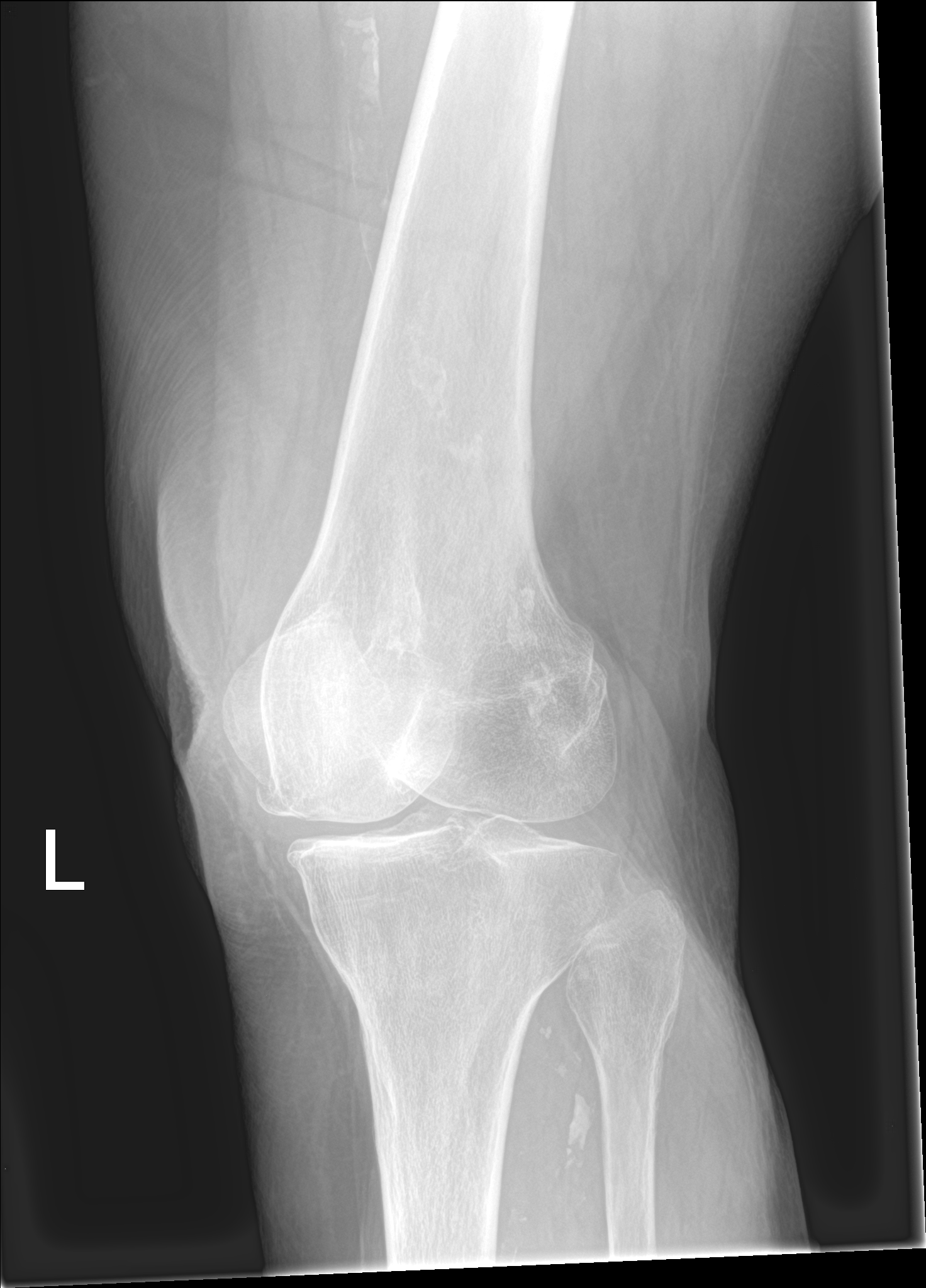

[4 of 4 positions shown; findings below may reference images not displayed]

FINDINGS: Frontal, lateral, and bilateral oblique views were obtained. There
is no fracture or dislocation. No joint effusion. There is moderate
narrowing medially and in the patellofemoral joint region. There is
spurring in all compartments. No erosive changes. There are foci of
arterial vascular calcification.
IMPRESSION: Osteoarthritic change, primarily medially and in the patellofemoral
joint. No fracture, dislocation, or effusion. Atherosclerotic
arterial vascular calcifications noted.

## 2020-12-20 IMAGING — DX DG KNEE COMPLETE 4+V*R*
4 series · 4 of 4 positions shown · non-contrast
Comparison: None.

CLINICAL DATA: Pain following fall

EXAM:
RIGHT KNEE - COMPLETE 4+ VIEW

[knee ap]
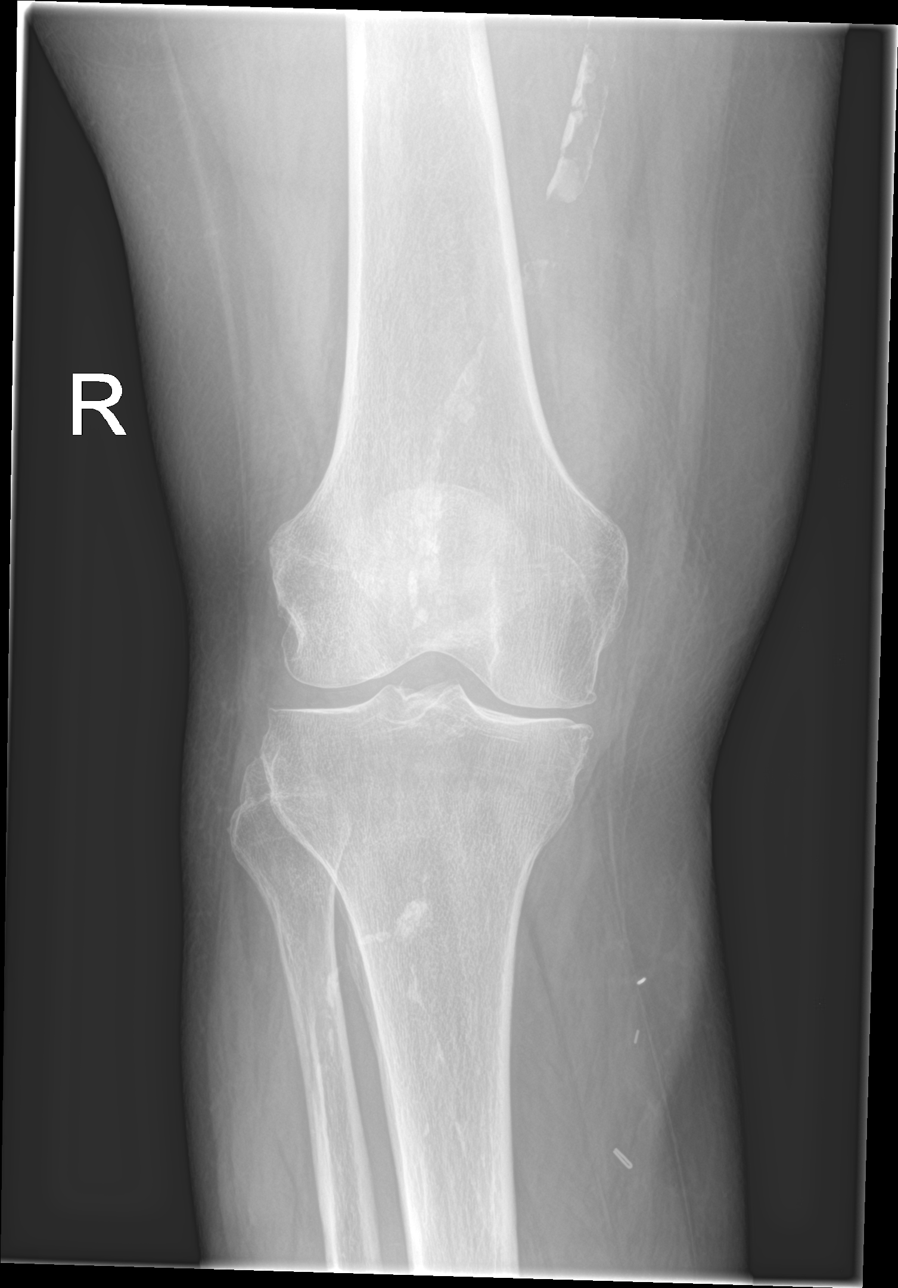

[knee lat]
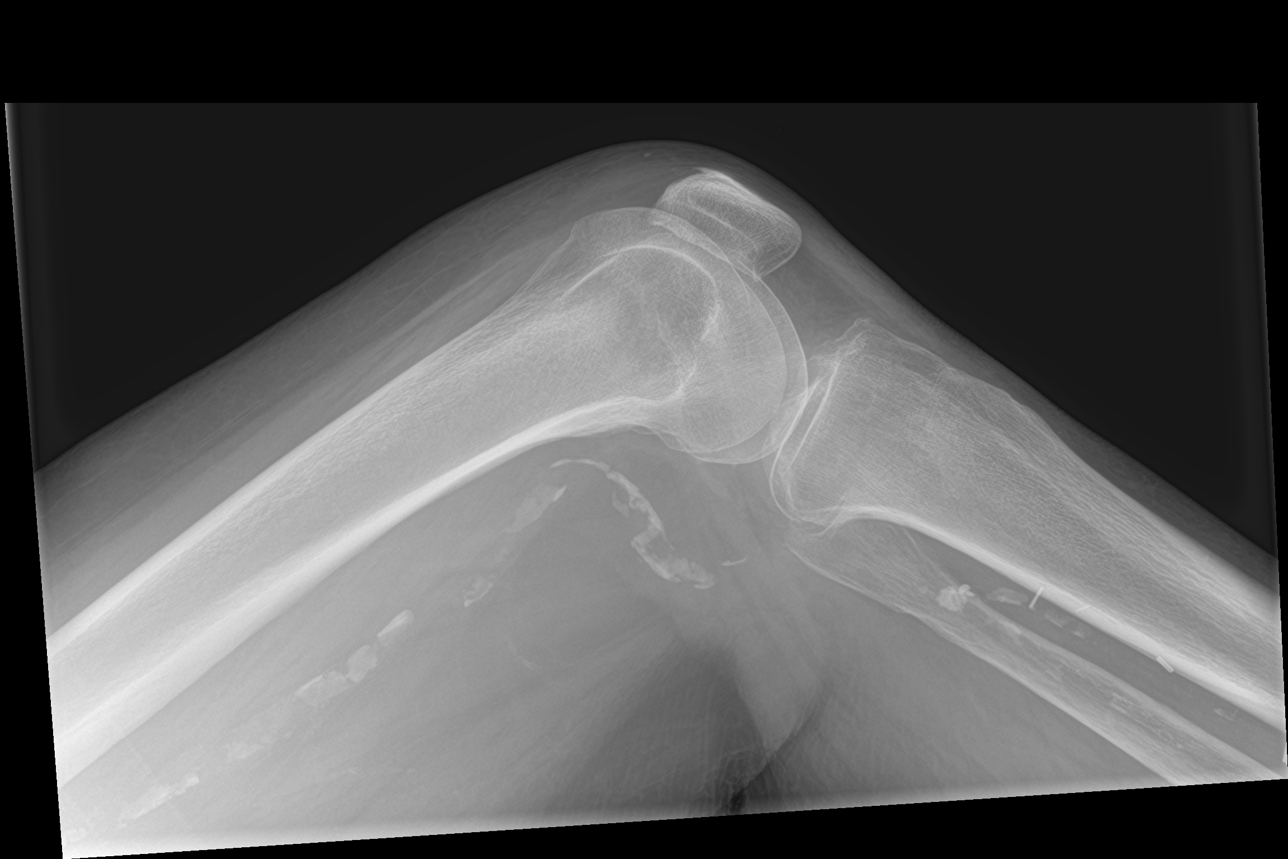

[knee obl (1 of 2)]
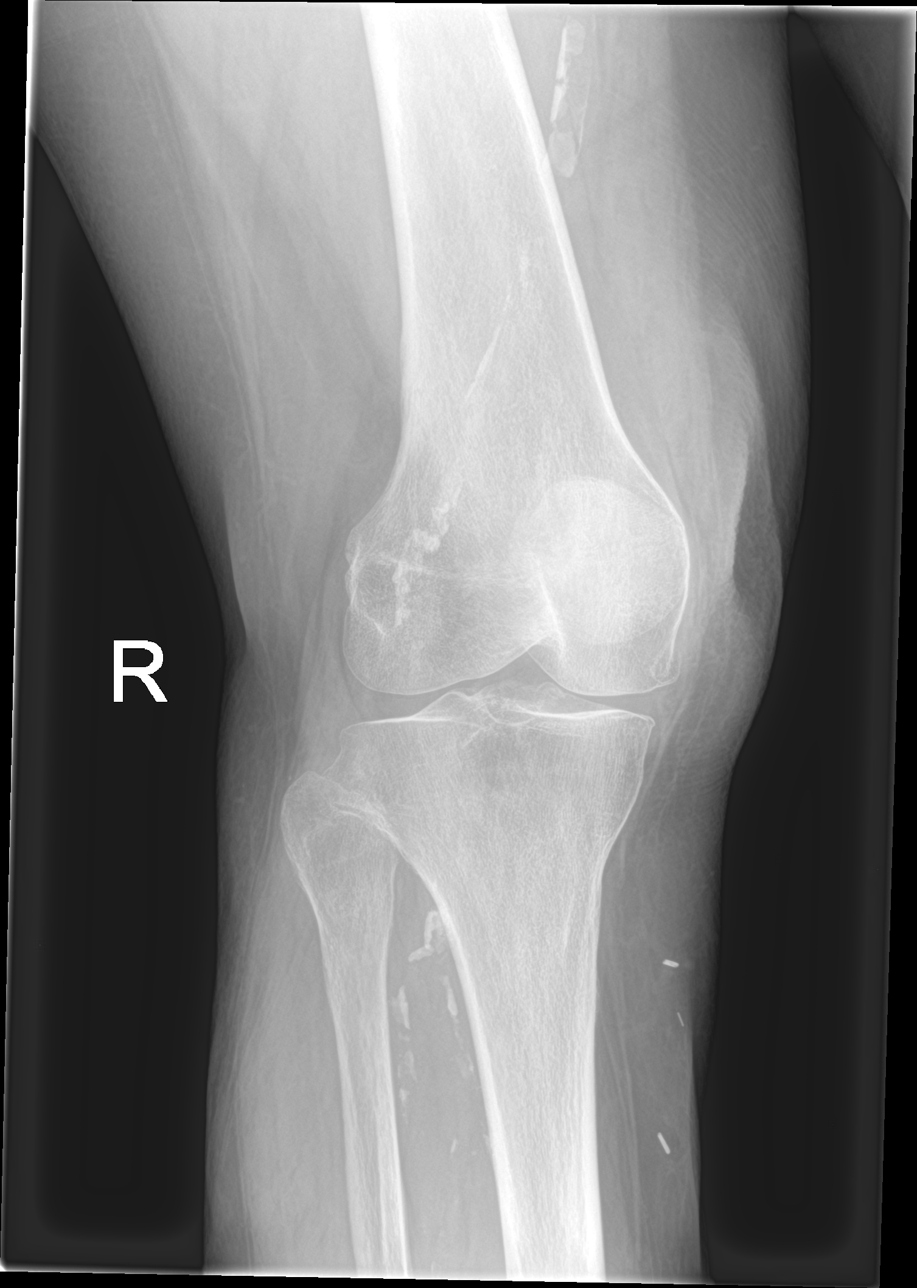

[knee obl (2 of 2)]
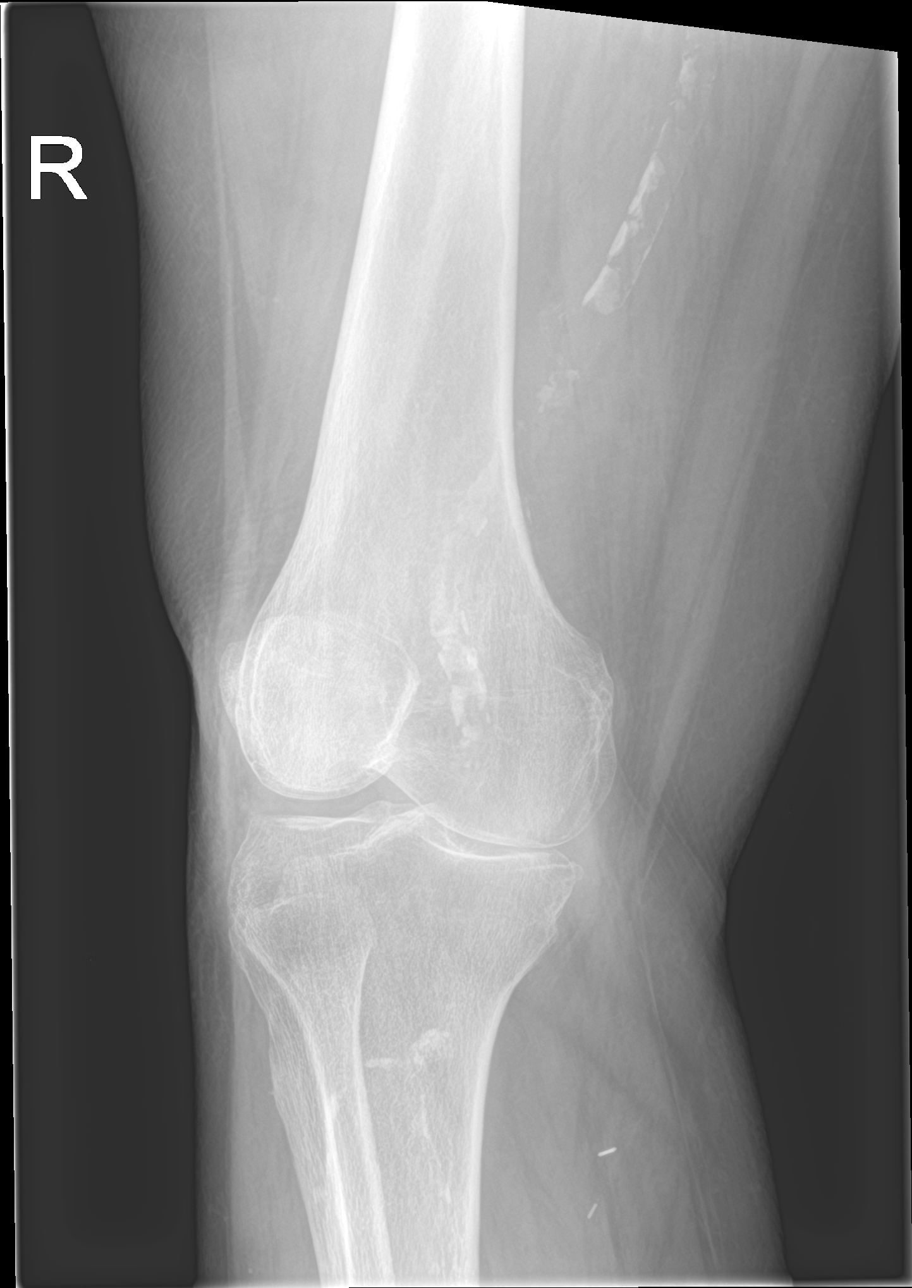

[4 of 4 positions shown; findings below may reference images not displayed]

FINDINGS: Frontal, lateral, and bilateral oblique views were obtained. No
appreciable fracture or dislocation. No joint effusion. There is
moderate narrowing of the patellofemoral joint. There is mild
narrowing medially. There is spurring in all compartments. No
erosive change. There are multiple foci of arterial vascular
calcification. Surgical clips are noted medial to the proximal
tibia.
IMPRESSION: Osteoarthritic change early, primarily in the patellofemoral joint
and medial compartments. No fracture, dislocation, or effusion. Foci
of atherosclerotic arterial vascular calcification noted.

## 2020-12-20 MED ORDER — SODIUM CHLORIDE 0.9 % IV SOLN
1.0000 g | INTRAVENOUS | Status: DC
  Administered 2020-12-20: 1 g via INTRAVENOUS
  Filled 2020-12-20: qty 10

## 2020-12-20 MED ORDER — IOHEXOL 350 MG/ML SOLN
75.0000 mL | Freq: Once | INTRAVENOUS | Status: AC | PRN
  Administered 2020-12-20: 75 mL via INTRAVENOUS

## 2020-12-20 NOTE — ED Notes (Signed)
Lab called critical troponin of 242. Provider notified.

## 2020-12-20 NOTE — ED Notes (Signed)
Patient transported to CT 

## 2020-12-20 NOTE — ED Triage Notes (Signed)
Multiple falls yesterday x 5. First fall was at 2200 stumbled last night, hit back of head on floor along elbows, right wrist. Then states fell 5 times after the initial fall then laid on floor until 1000 then got up by self and laid on bed until daughter got to her house, took her plavix (1000) then son called medics. BP 140/82, HR 76, 100% RA, CBG 149.

## 2020-12-20 NOTE — ED Provider Notes (Signed)
[facility] EMERGENCY DEPARTMENT Provider Note   CSN: [id number] Arrival date & time: 12/20/20  1416     History Chief Complaint  Patient presents with  . [place] is a 80 y.o. female.  The history is provided by the patient and medical records. No language interpreter was used.  Fall This is a new problem. The current episode started 12 to 24 hours ago. The problem occurs rarely. The problem has not changed since onset.Associated symptoms include headaches and shortness of breath. Pertinent negatives include no chest pain and no abdominal pain. Nothing aggravates the symptoms. Nothing relieves the symptoms. She has tried nothing for the symptoms. The treatment provided no relief.       Past Medical History:  Diagnosis Date  . Abdominal pain   . Accelerating angina ([place]) 08/26/2019  . Acute blood loss anemia 12/14/2019  . Allergic rhinitis 06/21/2014  . Anxiety   . Arthritis   . Asthma   . Atherosclerosis of arteries 08/24/2019  . Bilateral edema of lower extremity   . Body mass index 29.0-29.9, adult 08/24/2019  . CAD (coronary artery disease) 10/25/2019  . Cellulitis of right leg   . Combined hyperlipidemia 08/24/2019  . Complication of anesthesia    difficulty waking  . Congestive heart failure (CHF) ([place])   . COPD (chronic obstructive pulmonary disease) ([place])   . Coronary artery disease   . Dyspnea 02/09/2019  . Dyspnea on exertion   . Embolic stroke ([place]) 1/76/1607  . Essential thrombocythemia ([place]) 06/22/2014  . Essential thrombocytosis ([place]) 06/22/2014  . Gastrointestinal bleeding 06/23/2018  . GERD (gastroesophageal reflux disease)   . History of melanoma excision    left greast toe 2015  . History of squamous cell carcinoma excision    face--  multiple excisions  . History of subdural hematoma    2008  . HTN (hypertension) 06/21/2014  . Hypertension   . Ischial bursitis, right 08/24/2019  . Leucocytosis 12/14/2019  . Malignant melanoma  of great toe ([name]) 08/24/2019  . Melanoma in situ ([place]) 06/29/2014  . OSA (obstructive sleep apnea)    intolerant  . Pedal edema 08/24/2019  . Renal insufficiency 08/24/2019  . S/P CABG x 4 11/04/2019  . Skin cancer 08/24/2019  . Solitary pulmonary nodule on lung CT 02/09/2019  . Squamous cell carcinoma, scalp/neck 08/24/2019  . Stroke due to embolism ([place]) 11/25/2019  . Swelling of limb 08/24/2019  . Thrombocytosis    takes hydoxyurea--  MONITORED BY DR Hinton Rao [facility])  . VIN III (vulvar intraepithelial neoplasia III)   . Vitamin B 12 deficiency 08/24/2019  . Vitamin D deficiency 08/24/2019  . Wears dentures    UPPER AND LOWER PARTIAL  . Wears glasses     Patient Active Problem List   Diagnosis Date Noted  . Anxiety   . Arthritis   . Asthma   . Bilateral edema of lower extremity   . Complication of anesthesia   . Congestive heart failure (CHF) ([place])   . Coronary artery disease   . Dyspnea on exertion   . History of melanoma excision   . History of squamous cell carcinoma excision   . History of subdural hematoma   . Hypertension   . Thrombocytosis   . OSA (obstructive sleep apnea)   . Wears dentures   . Wears glasses   . Acute blood loss anemia 12/14/2019  . Leucocytosis 12/14/2019  . Embolic stroke ([place]) 37/05/6268  .  Cellulitis of right leg   . Abdominal pain   . Stroke due to embolism ([place]) 11/25/2019  . S/P CABG x 4 11/04/2019  . CAD (coronary artery disease) 10/25/2019  . Accelerating angina ([place]) 08/26/2019  . Vitamin D deficiency 08/24/2019  . Combined hyperlipidemia 08/24/2019  . Body mass index 29.0-29.9, adult 08/24/2019  . Vitamin B 12 deficiency 08/24/2019  . Malignant melanoma of great toe ([place]) 08/24/2019  . Pedal edema 08/24/2019  . Skin cancer 08/24/2019  . Squamous cell carcinoma, scalp/neck 08/24/2019  . Ischial bursitis, right 08/24/2019  . Swelling of limb 08/24/2019  . Renal insufficiency 08/24/2019  . Atherosclerosis of arteries  08/24/2019  . Dyspnea 02/09/2019  . Solitary pulmonary nodule on lung CT 02/09/2019  . Gastrointestinal bleeding 06/23/2018  . VIN III (vulvar intraepithelial neoplasia III) 10/09/2015  . Melanoma in situ ([place]) 06/29/2014  . Essential thrombocythemia ([place]) 06/22/2014  . Essential thrombocytosis ([place]) 06/22/2014  . COPD (chronic obstructive pulmonary disease) ([place]) 06/21/2014  . HTN (hypertension) 06/21/2014  . Allergic rhinitis 06/21/2014  . GERD (gastroesophageal reflux disease) 06/21/2014    Past Surgical History:  Procedure Laterality Date  . ABDOMINAL HYSTERECTOMY  age 24  . CARDIAC CATHETERIZATION  03/172021  . CORONARY ARTERY BYPASS GRAFT N/A 11/04/2019   Procedure: CORONARY ARTERY BYPASS GRAFTING (CABG) x 4, with ENDOSCOPIC HARVESTING OF RIGHT GREATER SAPHENOUS VEIN.;  Surgeon: Gaye Pollack, MD;  Location: [facility] OR;  Service: Open Heart Surgery;  Laterality: N/A;  . INTRAVASCULAR ULTRASOUND/IVUS N/A 11/03/2019   Procedure: Intravascular Ultrasound/IVUS;  Surgeon: Nelva Bush, MD;  Location: [facility] LAB;  Service: Cardiovascular;  Laterality: N/A;  . KNEE ARTHROSCOPY Left 2004  . LEFT HEART CATH AND CORONARY ANGIOGRAPHY N/A 11/03/2019   Procedure: LEFT HEART CATH AND CORONARY ANGIOGRAPHY;  Surgeon: Nelva Bush, MD;  Location: [facility];  Service: Cardiovascular;  Laterality: N/A;  . LOOP RECORDER INSERTION N/A 11/30/2019   Procedure: LOOP RECORDER INSERTION;  Surgeon: Thompson Grayer, MD;  Location: [facility];  Service: Cardiovascular;  Laterality: N/A;  . MELANOMA EXCISION  2015   left great toe  . REPAIR PERONEAL TENDONS ANKLE  2004  . SUBDURAL HEMATOMA EVACUATION VIA CRANIOTOMY  2008      week later  post-op  [facility]  . TEE WITHOUT CARDIOVERSION N/A 11/04/2019   Procedure: TRANSESOPHAGEAL ECHOCARDIOGRAM (TEE);  Surgeon: Gaye Pollack, MD;  Location: [facility];  Service: Open Heart Surgery;  Laterality: N/A;  . VULVECTOMY N/A 10/24/2015    Procedure: WIDE LOCAL EXCISION OF THE VULVA ;  Surgeon: Everitt Amber, MD;  Location: [facility];  Service: Gynecology;  Laterality: N/A;     OB History   No obstetric history on file.     Family History  Problem Relation Age of Onset  . Clotting disorder Mother   . Hypertension Mother   . Heart disease Mother   . Arthritis Mother   . Stroke Father   . Hypertension Father   . Heart disease Father   . Arthritis Father   . Emphysema Father   . Asthma Father   . Kidney failure Father   . Hypertension Sister   . Heart disease Sister   . Arthritis Sister   . Hypertension Brother   . Heart disease Brother   . Arthritis Brother     Social History   Tobacco Use  . Smoking status: Never Smoker  . Smokeless tobacco: Never Used  Vaping Use  .  Vaping Use: Never used  Substance Use Topics  . Alcohol use: No  . Drug use: No    Home Medications Prior to Admission medications   Medication Sig Start Date End Date Taking? Authorizing Provider  acetaminophen (TYLENOL) 325 MG tablet Take 1-2 tablets (325-650 mg total) by mouth every 4 (four) hours as needed for mild pain. 12/09/19   Love, Ivan Anchors, PA-C  ammonium lactate (LAC-HYDRIN) 12 % lotion Apply 1 application topically as needed for dry skin.    [provider]  Ascorbic Acid (VITAMIN C) 1000 MG tablet Take 1,000 mg by mouth daily.    [provider]  atorvastatin (LIPITOR) 80 MG tablet Take 80 mg by mouth daily. Takes it at 6pm daily    [provider]  Calcium Carb-Cholecalciferol (CALCIUM 600 + D PO) Take 1 tablet by mouth in the morning and at bedtime.     [provider]  clopidogrel (PLAVIX) 75 MG tablet Take 1 tablet (75 mg total) by mouth daily. 12/21/19   Revankar, [name], MD  fluticasone (FLONASE) 50 MCG/ACT nasal spray Place 2 sprays into both nostrils at bedtime.  04/24/14   [provider]  Magnesium 250 MG TABS Take 250 mg by mouth at bedtime.     [provider]  metoprolol tartrate (LOPRESSOR) 25 MG tablet Take 1 tablet (25 mg total) by mouth 2 (two) times daily. 12/21/19   Revankar, [name], MD  Multiple Vitamins-Minerals (PRESERVISION AREDS 2) CAPS Take 1 capsule by mouth in the morning and at bedtime.     [provider]  pantoprazole (PROTONIX) 40 MG tablet Take 1 tablet (40 mg total) by mouth daily. 12/21/19   Revankar, [name], MD  traMADol (ULTRAM) 50 MG tablet Take 1 tablet (50 mg total) by mouth every 6 (six) hours as needed for moderate pain. 12/09/19   Love, Ivan Anchors, PA-C  Vitamin D, Ergocalciferol, (DRISDOL) 1.25 MG (50000 UT) CAPS capsule Take 50,000 Units by mouth every 14 (fourteen) days. 06/01/19   [provider]    Allergies    Cephalexin, Ciprofloxacin, Penicillin g, and Penicillins  Review of Systems   Review of Systems  Constitutional: Positive for fatigue. Negative for chills, diaphoresis and fever.  HENT: Positive for congestion.   Eyes: Positive for visual disturbance (at baseline).  Respiratory: Positive for shortness of breath. Negative for cough, chest tightness and wheezing.   Cardiovascular: Negative for chest pain.  Gastrointestinal: Negative for abdominal pain, constipation, diarrhea, nausea and vomiting.  Genitourinary: Positive for frequency. Negative for difficulty urinating, dysuria and flank pain.  Musculoskeletal: Positive for back pain (chronic) and neck pain. Negative for neck stiffness ( ).  Skin: Negative for rash and wound.  Neurological: Positive for weakness (generalized), light-headedness and headaches. Negative for dizziness, syncope and speech difficulty.  Psychiatric/Behavioral: Negative for agitation and confusion.  All other systems reviewed and are negative.   Physical Exam Updated Vital Signs BP 92/79 (BP Location: Right Arm)   Pulse 68   Temp 98.5 F (36.9 C) (Oral)   Resp 18   Ht _0  (1.676 m)   Wt 77.1 kg   SpO2 97%   BMI 27.44 kg/m   Physical  Exam Vitals and nursing note reviewed.  Constitutional:      General: She is not in acute distress.    Appearance: She is well-developed. She is not ill-appearing, toxic-appearing or diaphoretic.  HENT:     Head: Normocephalic and atraumatic.  Nose: Nose normal. No congestion or rhinorrhea.     Mouth/Throat:     Mouth: Mucous membranes are moist.     Pharynx: No oropharyngeal exudate or posterior oropharyngeal erythema.  Eyes:     Extraocular Movements: Extraocular movements intact.     Conjunctiva/sclera: Conjunctivae normal.     Comments: L pupil less reactive  Cardiovascular:     Rate and Rhythm: Normal rate and regular rhythm.     Heart sounds: No murmur heard.   Pulmonary:     Effort: Pulmonary effort is normal. No respiratory distress.     Breath sounds: Normal breath sounds. No wheezing, rhonchi or rales.  Chest:     Chest wall: No tenderness.  Abdominal:     General: Abdomen is flat.     Palpations: Abdomen is soft.     Tenderness: There is no abdominal tenderness. There is no right CVA tenderness, left CVA tenderness, guarding or rebound.  Musculoskeletal:        General: Tenderness (knees) and signs of injury present.     Cervical back: Neck supple.     Right lower leg: No edema.     Left lower leg: No edema.  Skin:    General: Skin is warm and dry.     Capillary Refill: Capillary refill takes less than 2 seconds.     Findings: No erythema.  Neurological:     Mental Status: She is alert and oriented to person, place, and time.     Cranial Nerves: No dysarthria or facial asymmetry.     Sensory: No sensory deficit.     Motor: No weakness, tremor or abnormal muscle tone.     Coordination: Finger-Nose-Finger Test normal.     Comments: Gait deferred initially due to unsteadiness and 5 falls today   Psychiatric:        Mood and Affect: Mood normal.     ED Results / Procedures / Treatments   Labs (all labs ordered are listed, but only abnormal results are  displayed) Labs Reviewed  CBC WITH DIFFERENTIAL/PLATELET - Abnormal; Notable for the following components:      Result Value   WBC 28.2 (*)    RBC 3.38 (*)    Hemoglobin 10.3 (*)    HCT 33.8 (*)    RDW 16.6 (*)    Neutro Abs 25.7 (*)    Eosinophils Absolute 0.6 (*)    All other components within normal limits  COMPREHENSIVE METABOLIC PANEL - Abnormal; Notable for the following components:   Albumin 3.3 (*)    GFR, Estimated 60 (*)    All other components within normal limits  BRAIN NATRIURETIC PEPTIDE - Abnormal; Notable for the following components:   B Natriuretic Peptide 400.9 (*)    All other components within normal limits  PROTIME-INR - Abnormal; Notable for the following components:   Prothrombin Time 15.4 (*)    All other components within normal limits  TROPONIN I (HIGH SENSITIVITY) - Abnormal; Notable for the following components:   Troponin I (High Sensitivity) 242 (*)    All other components within normal limits  TROPONIN I (HIGH SENSITIVITY) - Abnormal; Notable for the following components:   Troponin I (High Sensitivity) 233 (*)    All other components within normal limits  RESP PANEL BY RT-PCR (FLU A&B, COVID) ARPGX2  URINE CULTURE  CULTURE, BLOOD (ROUTINE X 2)  CULTURE, BLOOD (ROUTINE X 2)  LACTIC ACID, PLASMA  LACTIC ACID, PLASMA  TSH  CK  URINALYSIS, ROUTINE  W REFLEX MICROSCOPIC    EKG EKG Interpretation  Date/Time:  Wednesday Dec 20 2020 14:18:42 EDT Ventricular Rate:  69 PR Interval:  117 QRS Duration: 88 QT Interval:  412 QTC Calculation: 442 R Axis:   -1 Text Interpretation: Sinus rhythm Borderline short PR interval Probable LVH with secondary repol abnrm When compared to prior,twave inversion in lead 3similar to ECG inMArch 18 2021. No STEMI Confirmed by Antony Blackbird [phone]) on 12/20/2020 3:43:29 PM   Radiology CT Angio Head W or Wo Contrast  Result Date: 12/20/2020 CLINICAL DATA:  Neuro deficit, acute, stroke suspected; history of  intracerebral aneurysm, headache for several days, multiple falls today, pain in neck, rule out bleed or aneurysmal change. EXAM: CT ANGIOGRAPHY HEAD AND NECK TECHNIQUE: Multidetector CT imaging of the head and neck was performed using the standard protocol during bolus administration of intravenous contrast. Multiplanar CT image reconstructions and MIPs were obtained to evaluate the vascular anatomy. Carotid stenosis measurements (when applicable) are obtained utilizing NASCET criteria, using the distal internal carotid diameter as the denominator. CONTRAST:  36m OMNIPAQUE IOHEXOL 350 MG/ML SOLN COMPARISON:  Brain MRI 11/25/2019. CT angiogram head/neck 11/25/2019. FINDINGS: CT HEAD FINDINGS Brain: Mild cerebral and cerebellar atrophy. Acute parafalcine subdural hematoma measuring up to 10 mm in greatest thickness (series 5, image 11). Unchanged size of a thin chronic subdural hematoma overlying the left cerebral hemisphere, measuring up to 4 mm, deep to the left-sided cranioplasty. Subacute infarct within the left thalamus. Additional known small subacute infarcts within the supratentorial and infratentorial brain were better appreciated on the prior brain MRI of 11/25/2019. 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 Advanced patchy and ill-defined hypoattenuation within the cerebral white matter, nonspecific but compatible with chronic small vessel ischemic disease. No acute demarcated cortical infarct. No evidence of intracranial mass. No midline shift. Vascular: No hyperdense vessel.  Atherosclerotic calcifications Skull: No calvarial fracture.  Left-sided cranioplasty. Sinuses: Scattered trace mucosal thickening and fluid within the bilateral ethmoid air cells. Small right maxillary sinus mucous retention cyst. Orbits: No mass or acute finding. Review of the MIP images confirms the above findings CTA NECK FINDINGS Aortic arch: Standard aortic branching. Atherosclerotic plaque within the  visualized aortic arch and proximal major branch vessels of the neck. No hemodynamically significant innominate or proximal subclavian artery stenosis. Right carotid system: CCA and ICA patent within the neck without significant stenosis (50% or greater). Mild calcified plaque within the carotid bifurcation and proximal ICA. Left carotid system: CCA and ICA patent within the neck without significant stenosis (50% or greater). Mild soft and calcified plaque within the carotid bifurcation and proximal ICA. Vertebral arteries: Codominant patent within the neck without stenosis. Mild nonstenotic calcified plaque at the origin of the right vertebral artery. Skeleton: No acute bony abnormality or aggressive osseous lesion. Cervical spondylosis. Cervical levocurvature with partially imaged thoracic dextrocurvature. Other neck: Neck mass or cervical lymphadenopathy. Upper chest: Consolidation within portions of the right upper lobe and within imaged portions of the superior right lower lobe compatible with pneumonia. A small ill-defined opacity also present within the left lung apex likely reflecting an additional site of pneumonia. Prior median sternotomy. Review of the MIP images confirms the above findings CTA HEAD FINDINGS Anterior circulation: The intracranial internal carotid arteries are patent. Plaque within both vessels without stenosis. Unchanged 2 mm inferomedially projecting aneurysm arising from the supraclinoid left ICA (series 12, image 122). The M1 middle cerebral arteries are patent. No M2 proximal branch occlusion or high-grade proximal stenosis is  identified. The anterior cerebral arteries are patent. Posterior circulation: The intracranial vertebral arteries are patent. The basilar artery is patent. The posterior cerebral arteries are patent. Posterior communicating arteries are hypoplastic or absent bilaterally. Venous sinuses: Within the limitations of contrast timing, no convincing thrombus. Anatomic  variants: As described Review of the MIP images confirms the above findings Acute CT head findings called by telephone at the time of interpretation on 12/20/2020 at 7:55 pm to provider [facility] , who verbally acknowledged these results. IMPRESSION: CT head: 1. Acute parafalcine subdural hematoma measuring up to 10 mm in greatest thickness. 2. Unchanged thin chronic subdural hematoma overlying the left cerebral hemisphere, deep to the left-sided cranioplasty (4 mm in thickness). 3. Subacute infarct within the left thalamus. Additional known small subacute infarcts within the supratentorial and infratentorial brain were better appreciated on the prior brain MRI of 11/25/2019. 4. 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 5. Stable parenchymal atrophy and severe cerebral white matter chronic small vessel ischemic disease. 6. Mild paranasal sinus disease, as described. CTA neck: 1. The common carotid, internal carotid and vertebral arteries are patent within the neck without hemodynamically significant stenosis. Atherosclerotic disease, as described. 2. Findings compatible right upper lobe, right lower lobe and left upper lobe pneumonia. Right lower lobe pneumonia is incompletely imaged. CTA head: 1. No intracranial large vessel occlusion or proximal high-grade arterial stenosis. 2. 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 Electronically Signed   By: Kellie Simmering DO   On: 12/20/2020 19:57   DG Chest 2 View  Result Date: 12/20/2020 CLINICAL DATA:  Dyspnea, fatigue, congestive heart failure EXAM: CHEST - 2 VIEW COMPARISON:  12/22/2019 FINDINGS: There is focal consolidation within the posterior right upper lobe, likely infectious in the acute setting. The chin overlies the lung apices, however, no definite pneumothorax. No pleural effusion. Cardiac size is mildly enlarged. Coronary artery bypass grafting has been performed. The  pulmonary vascularity is normal. No acute bone abnormality. IMPRESSION: Right upper lobe consolidation in keeping with acute lobar pneumonia in the appropriate clinical setting. Follow-up chest radiograph is recommended in 3-4 weeks to document resolution and exclude the presence of a central obstructing lesion. Electronically Signed   By: Fidela Salisbury MD   On: 12/20/2020 16:32   CT Angio Neck W and/or Wo Contrast  Result Date: 12/20/2020 CLINICAL DATA:  Neuro deficit, acute, stroke suspected; history of intracerebral aneurysm, headache for several days, multiple falls today, pain in neck, rule out bleed or aneurysmal change. EXAM: CT ANGIOGRAPHY HEAD AND NECK TECHNIQUE: Multidetector CT imaging of the head and neck was performed using the standard protocol during bolus administration of intravenous contrast. Multiplanar CT image reconstructions and MIPs were obtained to evaluate the vascular anatomy. Carotid stenosis measurements (when applicable) are obtained utilizing NASCET criteria, using the distal internal carotid diameter as the denominator. CONTRAST:  39m OMNIPAQUE IOHEXOL 350 MG/ML SOLN COMPARISON:  Brain MRI 11/25/2019. CT angiogram head/neck 11/25/2019. FINDINGS: CT HEAD FINDINGS Brain: Mild cerebral and cerebellar atrophy. Acute parafalcine subdural hematoma measuring up to 10 mm in greatest thickness (series 5, image 11). Unchanged size of a thin chronic subdural hematoma overlying the left cerebral hemisphere, measuring up to 4 mm, deep to the left-sided cranioplasty. Subacute infarct within the left thalamus. Additional known small subacute infarcts within the supratentorial and infratentorial brain were better appreciated on the prior brain MRI of 11/25/2019. 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 Advanced patchy and  ill-defined hypoattenuation within the cerebral white matter, nonspecific but compatible with chronic small vessel ischemic disease. No acute  demarcated cortical infarct. No evidence of intracranial mass. No midline shift. Vascular: No hyperdense vessel.  Atherosclerotic calcifications Skull: No calvarial fracture.  Left-sided cranioplasty. Sinuses: Scattered trace mucosal thickening and fluid within the bilateral ethmoid air cells. Small right maxillary sinus mucous retention cyst. Orbits: No mass or acute finding. Review of the MIP images confirms the above findings CTA NECK FINDINGS Aortic arch: Standard aortic branching. Atherosclerotic plaque within the visualized aortic arch and proximal major branch vessels of the neck. No hemodynamically significant innominate or proximal subclavian artery stenosis. Right carotid system: CCA and ICA patent within the neck without significant stenosis (50% or greater). Mild calcified plaque within the carotid bifurcation and proximal ICA. Left carotid system: CCA and ICA patent within the neck without significant stenosis (50% or greater). Mild soft and calcified plaque within the carotid bifurcation and proximal ICA. Vertebral arteries: Codominant patent within the neck without stenosis. Mild nonstenotic calcified plaque at the origin of the right vertebral artery. Skeleton: No acute bony abnormality or aggressive osseous lesion. Cervical spondylosis. Cervical levocurvature with partially imaged thoracic dextrocurvature. Other neck: Neck mass or cervical lymphadenopathy. Upper chest: Consolidation within portions of the right upper lobe and within imaged portions of the superior right lower lobe compatible with pneumonia. A small ill-defined opacity also present within the left lung apex likely reflecting an additional site of pneumonia. Prior median sternotomy. Review of the MIP images confirms the above findings CTA HEAD FINDINGS Anterior circulation: The intracranial internal carotid arteries are patent. Plaque within both vessels without stenosis. Unchanged 2 mm inferomedially projecting aneurysm arising from  the supraclinoid left ICA (series 12, image 122). The M1 middle cerebral arteries are patent. No M2 proximal branch occlusion or high-grade proximal stenosis is identified. The anterior cerebral arteries are patent. Posterior circulation: The intracranial vertebral arteries are patent. The basilar artery is patent. The posterior cerebral arteries are patent. Posterior communicating arteries are hypoplastic or absent bilaterally. Venous sinuses: Within the limitations of contrast timing, no convincing thrombus. Anatomic variants: As described Review of the MIP images confirms the above findings Acute CT head findings called by telephone at the time of interpretation on 12/20/2020 at 7:55 pm to provider [facility] , who verbally acknowledged these results. IMPRESSION: CT head: 1. Acute parafalcine subdural hematoma measuring up to 10 mm in greatest thickness. 2. Unchanged thin chronic subdural hematoma overlying the left cerebral hemisphere, deep to the left-sided cranioplasty (4 mm in thickness). 3. Subacute infarct within the left thalamus. Additional known small subacute infarcts within the supratentorial and infratentorial brain were better appreciated on the prior brain MRI of 11/25/2019. 4. 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 5. Stable parenchymal atrophy and severe cerebral white matter chronic small vessel ischemic disease. 6. Mild paranasal sinus disease, as described. CTA neck: 1. The common carotid, internal carotid and vertebral arteries are patent within the neck without hemodynamically significant stenosis. Atherosclerotic disease, as described. 2. Findings compatible right upper lobe, right lower lobe and left upper lobe pneumonia. Right lower lobe pneumonia is incompletely imaged. CTA head: 1. No intracranial large vessel occlusion or proximal high-grade arterial stenosis. 2. 2 mm aneurysm arising from the supraclinoid left ICA, unchanged from the CTA of  11/25/2019. Electronically Signed   By: Kellie Simmering DO   On: 12/20/2020 19:57   DG Knee Complete 4 Views Left  Result Date: 12/20/2020 CLINICAL DATA:  Pain  following fall EXAM: LEFT KNEE - COMPLETE 4+ VIEW COMPARISON:  None. FINDINGS: Frontal, lateral, and bilateral oblique views were obtained. There is no fracture or dislocation. No joint effusion. There is moderate narrowing medially and in the patellofemoral joint region. There is spurring in all compartments. No erosive changes. There are foci of arterial vascular calcification. 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 No fracture, dislocation, or effusion. Atherosclerotic arterial vascular calcifications noted. Electronically Signed   By: Lowella Grip III M.D.   On: 12/20/2020 16:28   DG Knee Complete 4 Views Right  Result Date: 12/20/2020 CLINICAL DATA:  Pain following fall EXAM: RIGHT KNEE - COMPLETE 4+ VIEW COMPARISON:  None. FINDINGS: Frontal, lateral, and bilateral oblique views were obtained. No appreciable fracture or dislocation. No joint effusion. There is moderate narrowing of the patellofemoral joint. There is mild narrowing medially. There is spurring in all compartments. No erosive change. There are multiple foci of arterial vascular calcification. Surgical clips are noted medial to the proximal tibia. IMPRESSION: Osteoarthritic change early, primarily in the patellofemoral joint and medial compartments. No fracture, dislocation, or effusion. Foci of atherosclerotic arterial vascular calcification noted. Electronically Signed   By: Lowella Grip III M.D.   On: 12/20/2020 16:29    Procedures Procedures   CRITICAL CARE Performed by: Gwenyth Allegra Breck Hollinger Total critical care time: 45 minutes Critical care time was exclusive of separately billable procedures and treating other patients. Critical care was necessary to treat or prevent imminent or life-threatening  deterioration. Critical care was time spent personally by me on the following activities: development of treatment plan with patient and/or surrogate as well as nursing, discussions with consultants, evaluation of patient's response to treatment, examination of patient, obtaining history from patient or surrogate, ordering and performing treatments and interventions, ordering and review of laboratory studies, ordering and review of radiographic studies, pulse oximetry and re-evaluation of patient's condition.   Medications Ordered in ED Medications  cefTRIAXone (ROCEPHIN) 1 g in sodium chloride 0.9 % 100 mL IVPB (0 g Intravenous Stopped 12/20/20 2203)  iohexol (OMNIPAQUE) 350 MG/ML injection 75 mL (75 mLs Intravenous Contrast Given 12/20/20 1900)    ED Course  I have reviewed the triage vital signs and the nursing notes.  Pertinent labs & imaging results that were available during my care of the patient were reviewed by me and considered in my medical decision making (see chart for details).    MDM Rules/Calculators/A&P                          Yolanda Flowers is a 80 y.o. 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  Patient is coming by family reports that patient has had a gradual decline over the last few weeks with more and or fatigue.  She has had exertional shortness of  breath but denies chest pain.  She reports that some congestion but denies significant cough.  She denies fevers or chills.  She says that she saw her oncologist several days ago and they are in the process of scheduling a bone marrow biopsy to look into the etiology of her worsening thrombocytopenia and anemia.  She says that for the last week or so she has had urinary symptoms with frequency, darkened urine, and foul-smelling urine.  She denies constipation or diarrhea.  She reports she is eating and drinking fairly well.  She denies any chest pain or abdominal pain but does say that over the last 2 days she has had a moderate to severe headache that goes into her neck.  She says that overnight she had a fall and could not get up and subsequently stumbled to the ground to get around 4 or 5 times.  She reports she is still having moderate headache.  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  She reports she is having some pain in both knees from the falls.  She also reports some chronic back pain but she does not feel it is any different than her baseline.  She reports that her unsteadiness has been worse as she has been "leaning to the right" when trying to walk for the last few days.  On exam, lungs are clear and chest is nontender.  Abdomen is nontender.  No focal back tenderness in the midline on my exam.  She does have some mild tenderness in her paraspinal neck.  Some tenderness in her occiput with no evidence of laceration or large hematoma seen.  She has minimally responsive pupil on the left compared to the right which she reports is at its baseline.  Normal extraocular movements.  Normal sensation and strength in extremities for me and normal finger-nose-finger testing bilaterally.  Clear speech.  Some abrasions to both knees with some tenderness but otherwise intact sensation strength and pulses in lower extremities.  No  hip tenderness.  Gait was deferred due to her 5 falls today and still feeling somewhat unsteady.  Clinically I am somewhat concerned about her multiple falls today as this is a new problem for her.  We discussed that given the urinary symptoms she could have an underlying UTI that could have caused recrudescence of prior stroke symptoms of unsteadiness however with her headaches, we will get CTA of the head and neck given her known aneurysm.  We will also get work-up to look for occult infection such as UTI or pneumonia.  Will check for COVID given the ongoing pandemic and the mild congestion she reported.  We will check other labs.  Anticipate reassessment after work-up to determine disposition.  8:27 PM Work-up completed and unfortunately revealed several concerning findings.  Patient was indeed found to have a new parafalcine subdural hematoma around 1 cm in size.  There is also a possible subacute stroke that is new.  I spoke to neurology who recommended MRI and due to the subdural, recommended touching base with neurosurgery.  Otherwise the patient does have a leukocytosis of 28.2 which is worse than prior but does have normal platelets as the patient was concerned might be very low.  Lactic acid is normal x2 but x-ray does show evidence of pneumonia.  I reassessed the patient and she does report some congestion and mild cough although her COVID and flu test are negative.  Due to the fatigue, white blood cell count, and these findings and x-ray,  we will start with antibiotics for pneumonia.  I spoke with pharmacy who will place the orders given her previous allergies, intolerances, and drug interactions.  After speaking with neurosurgery, dissipate admission to medicine for further management.  Neurosurgery will come to the patient and they agree she needs admission to medicine for monitoring and further treatment of pneumonia.  They will leave recommendations after they see her.  Patient agrees  with plan of care given her ongoing headache, unsteadiness with more falls, and a new subdural hematoma.  Patient had antibiotics ordered by pharmacy.    She will be admitted.   Final Clinical Impression(s) / ED Diagnoses Final diagnoses:  Fall, initial encounter  Acute nonintractable headache, unspecified headache type  SDH (subdural hematoma) ([place])  Unsteadiness  Community acquired pneumonia, unspecified laterality    Rx / DC Orders ED Discharge Orders    None      Clinical Impression: 1. Fall, initial encounter   2. Acute nonintractable headache, unspecified headache type   3. SDH (subdural hematoma) ([facility])   4. Unsteadiness   5. Community acquired pneumonia, unspecified laterality     Disposition: Admit  This note was prepared with assistance of Systems analyst. Occasional wrong-word or sound-a-like substitutions may have occurred due to the inherent limitations of voice recognition software.     Tushar Enns, Gwenyth Allegra, MD 12/21/20 0000

## 2020-12-20 NOTE — Consult Note (Addendum)
Reason for Consult: sdh Referring Physician: edp  Yolanda Flowers is an 80 y.o. female.   HPI:  Very pleasant 80 year old presented to the ED tonight after multiple falls over the last 24 hours. Does report hitting her head. She has fallen about 5 times. She denies any headaches NV dizziness or vision changes. She is resting comfortably in the bed. She has also been diagnosed with pneumonia. Reports having a cough the last couple days but denies any fever or chills. Has a history of left sided craniotomy in 2008 for SDH at [facility]. She is on plavix for her heart but doesn't know why specifically.  Past Medical History:  Diagnosis Date  . Abdominal pain   . Accelerating angina ([place]) 08/26/2019  . Acute blood loss anemia 12/14/2019  . Allergic rhinitis 06/21/2014  . Anxiety   . Arthritis   . Asthma   . Atherosclerosis of arteries 08/24/2019  . Bilateral edema of lower extremity   . Body mass index 29.0-29.9, adult 08/24/2019  . CAD (coronary artery disease) 10/25/2019  . Cellulitis of right leg   . Combined hyperlipidemia 08/24/2019  . Complication of anesthesia    difficulty waking  . Congestive heart failure (CHF) ([place])   . COPD (chronic obstructive pulmonary disease) ([place])   . Coronary artery disease   . Dyspnea 02/09/2019  . Dyspnea on exertion   . Embolic stroke ([place]) 1/61/0960  . Essential thrombocythemia ([place]) 06/22/2014  . Essential thrombocytosis ([place]) 06/22/2014  . Gastrointestinal bleeding 06/23/2018  . GERD (gastroesophageal reflux disease)   . History of melanoma excision    left greast toe 2015  . History of squamous cell carcinoma excision    face--  multiple excisions  . History of subdural hematoma    2008  . HTN (hypertension) 06/21/2014  . Hypertension   . Ischial bursitis, right 08/24/2019  . Leucocytosis 12/14/2019  . Malignant melanoma of great toe ([place]) 08/24/2019  . Melanoma in situ ([place]) 06/29/2014  . OSA (obstructive sleep apnea)    intolerant  . Pedal edema 08/24/2019   . Renal insufficiency 08/24/2019  . S/P CABG x 4 11/04/2019  . Skin cancer 08/24/2019  . Solitary pulmonary nodule on lung CT 02/09/2019  . Squamous cell carcinoma, scalp/neck 08/24/2019  . Stroke due to embolism ([place]) 11/25/2019  . Swelling of limb 08/24/2019  . Thrombocytosis    takes hydoxyurea--  MONITORED BY DR Hinton Rao [facility])  . VIN III (vulvar intraepithelial neoplasia III)   . Vitamin B 12 deficiency 08/24/2019  . Vitamin D deficiency 08/24/2019  . Wears dentures    UPPER AND LOWER PARTIAL  . Wears glasses     Past Surgical History:  Procedure Laterality Date  . ABDOMINAL HYSTERECTOMY  age 61  . CARDIAC CATHETERIZATION  03/172021  . CORONARY ARTERY BYPASS GRAFT N/A 11/04/2019   Procedure: CORONARY ARTERY BYPASS GRAFTING (CABG) x 4, with ENDOSCOPIC HARVESTING OF RIGHT GREATER SAPHENOUS VEIN.;  Surgeon: Gaye Pollack, MD;  Location: [place] OR;  Service: Open Heart Surgery;  Laterality: N/A;  . INTRAVASCULAR ULTRASOUND/IVUS N/A 11/03/2019   Procedure: Intravascular Ultrasound/IVUS;  Surgeon: Nelva Bush, MD;  Location: [place] [facility] LAB;  Service: Cardiovascular;  Laterality: N/A;  . KNEE ARTHROSCOPY Left 2004  . LEFT HEART CATH AND CORONARY ANGIOGRAPHY N/A 11/03/2019   Procedure: LEFT HEART CATH AND CORONARY ANGIOGRAPHY;  Surgeon: Nelva Bush, MD;  Location: [place] [facility];  Service: Cardiovascular;  Laterality: N/A;  . LOOP RECORDER INSERTION N/A  11/30/2019   Procedure: LOOP RECORDER INSERTION;  Surgeon: Thompson Grayer, MD;  Location: [place] [facility];  Service: Cardiovascular;  Laterality: N/A;  . MELANOMA EXCISION  2015   left great toe  . REPAIR PERONEAL TENDONS ANKLE  2004  . SUBDURAL HEMATOMA EVACUATION VIA CRANIOTOMY  2008      week later  post-op  [facility]  . TEE WITHOUT CARDIOVERSION N/A 11/04/2019   Procedure: TRANSESOPHAGEAL ECHOCARDIOGRAM (TEE);  Surgeon: Gaye Pollack, MD;  Location: [facility];  Service: Open Heart Surgery;  Laterality: N/A;  .  VULVECTOMY N/A 10/24/2015   Procedure: WIDE LOCAL EXCISION OF THE VULVA ;  Surgeon: Everitt Amber, MD;  Location: [facility];  Service: Gynecology;  Laterality: N/A;    Allergies  Allergen Reactions  . Cephalexin Hives  . Ciprofloxacin Other (See Comments)    Gi intolerance  . Penicillin G   . Penicillins Rash    Childhood reaction Did it involve swelling of the face/tongue/throat, SOB, or low BP? No Did it involve sudden or severe rash/hives, skin peeling, or any reaction on the inside of your mouth or nose? No Did you need to seek medical attention at a hospital or doctor's office? No When did it last happen?50 years ago If all above answers are "NO", may proceed with cephalosporin use.     Social History   Tobacco Use  . Smoking status: Never Smoker  . Smokeless tobacco: Never Used  Substance Use Topics  . Alcohol use: No    Family History  Problem Relation Age of Onset  . Clotting disorder Mother   . Hypertension Mother   . Heart disease Mother   . Arthritis Mother   . Stroke Father   . Hypertension Father   . Heart disease Father   . Arthritis Father   . Emphysema Father   . Asthma Father   . Kidney failure Father   . Hypertension Sister   . Heart disease Sister   . Arthritis Sister   . Hypertension Brother   . Heart disease Brother   . Arthritis Brother      Review of Systems  Positive ROS: as above  All other systems have been reviewed and were otherwise negative with the exception of those mentioned in the HPI and as above.  Objective: Vital signs in last 24 hours: Temp:  [98.3 F (36.8 C)-98.5 F (36.9 C)] 98.5 F (36.9 C) (05/04 1800) Pulse Rate:  [66-73] 66 (05/04 2130) Resp:  [13-26] 26 (05/04 2130) BP: (92-138)/(61-84) 118/72 (05/04 2130) SpO2:  [94 %-100 %] 94 % (05/04 2130) Weight:  [77.1 kg] 77.1 kg (05/04 1428)  General Appearance: Alert, cooperative, no distress, appears stated age Head: Normocephalic, without  obvious abnormality, atraumatic Eyes: PERRL, conjunctiva/corneas clear, EOM's intact, fundi benign, both eyes      Neck: Supple, symmetrical, trachea midline, no adenopathy; thyroid: No enlargement/tenderness/nodules; no carotid bruit or JVD Back: Symmetric, no curvature, ROM normal, no CVA tenderness Lungs: Clear to auscultation bilaterally, respirations unlabored Heart: Regular rate and rhythm Skin: Skin color, texture, turgor normal, no rashes or lesions  NEUROLOGIC:   Mental status: A&O x4, no aphasia, good attention span, Memory and fund of knowledge Motor Exam - grossly normal, normal tone and bulk Sensory Exam - grossly normal Reflexes: symmetric, no pathologic reflexes, No Hoffman's, No clonus Coordination - grossly normal Gait - not tested Balance - not tested Cranial Nerves: I: smell Not tested  II: visual acuity  OS: na  OD: na  II: visual fields Full to confrontation  II: pupils Equal, round, reactive to light  III,VII: ptosis None  III,IV,VI: extraocular muscles  Full ROM  V: mastication Normal  V: facial light touch sensation  Normal  V,VII: corneal reflex  Present  VII: facial muscle function - upper  Normal  VII: facial muscle function - lower Normal  VIII: hearing Not tested  IX: soft palate elevation  Normal  IX,X: gag reflex Present  XI: trapezius strength  5/5  XI: sternocleidomastoid strength 5/5  XI: neck flexion strength  5/5  XII: tongue strength  Normal    Data Review Lab Results  Component Value Date   WBC 28.2 (H) 12/20/2020   HGB 10.3 (L) 12/20/2020   HCT 33.8 (L) 12/20/2020   MCV 100.0 12/20/2020   PLT 172 12/20/2020   Lab Results  Component Value Date   NA 138 12/20/2020   K 4.5 12/20/2020   CL 104 12/20/2020   CO2 23 12/20/2020   BUN 15 12/20/2020   CREATININE 0.96 12/20/2020   GLUCOSE 99 12/20/2020   Lab Results  Component Value Date   INR 1.2 12/20/2020    Radiology: CT Angio Head W or Wo Contrast  Result Date:  12/20/2020 CLINICAL DATA:  Neuro deficit, acute, stroke suspected; history of intracerebral aneurysm, headache for several days, multiple falls today, pain in neck, rule out bleed or aneurysmal change. EXAM: CT ANGIOGRAPHY HEAD AND NECK TECHNIQUE: Multidetector CT imaging of the head and neck was performed using the standard protocol during bolus administration of intravenous contrast. Multiplanar CT image reconstructions and MIPs were obtained to evaluate the vascular anatomy. Carotid stenosis measurements (when applicable) are obtained utilizing NASCET criteria, using the distal internal carotid diameter as the denominator. CONTRAST:  84mL OMNIPAQUE IOHEXOL 350 MG/ML SOLN COMPARISON:  Brain MRI 11/25/2019. CT angiogram head/neck 11/25/2019. FINDINGS: CT HEAD FINDINGS Brain: Mild cerebral and cerebellar atrophy. Acute parafalcine subdural hematoma measuring up to 10 mm in greatest thickness (series 5, image 11). Unchanged size of a thin chronic subdural hematoma overlying the left cerebral hemisphere, measuring up to 4 mm, deep to the left-sided cranioplasty. Subacute infarct within the left thalamus. Additional known small subacute infarcts within the supratentorial and infratentorial brain were better appreciated on the prior brain MRI of 11/25/2019. 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 Advanced patchy and ill-defined hypoattenuation within the cerebral white matter, nonspecific but compatible with chronic small vessel ischemic disease. No acute demarcated cortical infarct. No evidence of intracranial mass. No midline shift. Vascular: No hyperdense vessel.  Atherosclerotic calcifications Skull: No calvarial fracture.  Left-sided cranioplasty. Sinuses: Scattered trace mucosal thickening and fluid within the bilateral ethmoid air cells. Small right maxillary sinus mucous retention cyst. Orbits: No mass or acute finding. Review of the MIP images confirms the above findings CTA NECK  FINDINGS Aortic arch: Standard aortic branching. Atherosclerotic plaque within the visualized aortic arch and proximal major branch vessels of the neck. No hemodynamically significant innominate or proximal subclavian artery stenosis. Right carotid system: CCA and ICA patent within the neck without significant stenosis (50% or greater). Mild calcified plaque within the carotid bifurcation and proximal ICA. Left carotid system: CCA and ICA patent within the neck without significant stenosis (50% or greater). Mild soft and calcified plaque within the carotid bifurcation and proximal ICA. Vertebral arteries: Codominant patent within the neck without stenosis. Mild nonstenotic calcified plaque at the origin of the right vertebral artery. Skeleton: No acute bony abnormality or aggressive osseous  lesion. Cervical spondylosis. Cervical levocurvature with partially imaged thoracic dextrocurvature. Other neck: Neck mass or cervical lymphadenopathy. Upper chest: Consolidation within portions of the right upper lobe and within imaged portions of the superior right lower lobe compatible with pneumonia. A small ill-defined opacity also present within the left lung apex likely reflecting an additional site of pneumonia. Prior median sternotomy. Review of the MIP images confirms the above findings CTA HEAD FINDINGS Anterior circulation: The intracranial internal carotid arteries are patent. Plaque within both vessels without stenosis. Unchanged 2 mm inferomedially projecting aneurysm arising from the supraclinoid left ICA (series 12, image 122). The M1 middle cerebral arteries are patent. No M2 proximal branch occlusion or high-grade proximal stenosis is identified. The anterior cerebral arteries are patent. Posterior circulation: The intracranial vertebral arteries are patent. The basilar artery is patent. The posterior cerebral arteries are patent. Posterior communicating arteries are hypoplastic or absent bilaterally. Venous  sinuses: Within the limitations of contrast timing, no convincing thrombus. Anatomic variants: As described Review of the MIP images confirms the above findings Acute CT head findings called by telephone at the time of interpretation on 12/20/2020 at 7:55 pm to provider [facility] , who verbally acknowledged these results. IMPRESSION: CT head: 1. Acute parafalcine subdural hematoma measuring up to 10 mm in greatest thickness. 2. Unchanged thin chronic subdural hematoma overlying the left cerebral hemisphere, deep to the left-sided cranioplasty (4 mm in thickness). 3. Subacute infarct within the left thalamus. Additional known small subacute infarcts within the supratentorial and infratentorial brain were better appreciated on the prior brain MRI of 11/25/2019. 4. 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 5. Stable parenchymal atrophy and severe cerebral white matter chronic small vessel ischemic disease. 6. Mild paranasal sinus disease, as described. CTA neck: 1. The common carotid, internal carotid and vertebral arteries are patent within the neck without hemodynamically significant stenosis. Atherosclerotic disease, as described. 2. Findings compatible right upper lobe, right lower lobe and left upper lobe pneumonia. Right lower lobe pneumonia is incompletely imaged. CTA head: 1. No intracranial large vessel occlusion or proximal high-grade arterial stenosis. 2. 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 Electronically Signed   By: Kellie Simmering DO   On: 12/20/2020 19:57   DG Chest 2 View  Result Date: 12/20/2020 CLINICAL DATA:  Dyspnea, fatigue, congestive heart failure EXAM: CHEST - 2 VIEW COMPARISON:  12/22/2019 FINDINGS: There is focal consolidation within the posterior right upper lobe, likely infectious in the acute setting. The chin overlies the lung apices, however, no definite pneumothorax. No pleural effusion. Cardiac size  is mildly enlarged. Coronary artery bypass grafting has been performed. The pulmonary vascularity is normal. No acute bone abnormality. IMPRESSION: Right upper lobe consolidation in keeping with acute lobar pneumonia in the appropriate clinical setting. Follow-up chest radiograph is recommended in 3-4 weeks to document resolution and exclude the presence of a central obstructing lesion. Electronically Signed   By: Fidela Salisbury MD   On: 12/20/2020 16:32   CT Angio Neck W and/or Wo Contrast  Result Date: 12/20/2020 CLINICAL DATA:  Neuro deficit, acute, stroke suspected; history of intracerebral aneurysm, headache for several days, multiple falls today, pain in neck, rule out bleed or aneurysmal change. EXAM: CT ANGIOGRAPHY HEAD AND NECK TECHNIQUE: Multidetector CT imaging of the head and neck was performed using the standard protocol during bolus administration of intravenous contrast. Multiplanar CT image reconstructions and MIPs were obtained to evaluate the vascular anatomy. Carotid stenosis  measurements (when applicable) are obtained utilizing NASCET criteria, using the distal internal carotid diameter as the denominator. CONTRAST:  26mL OMNIPAQUE IOHEXOL 350 MG/ML SOLN COMPARISON:  Brain MRI 11/25/2019. CT angiogram head/neck 11/25/2019. FINDINGS: CT HEAD FINDINGS Brain: Mild cerebral and cerebellar atrophy. Acute parafalcine subdural hematoma measuring up to 10 mm in greatest thickness (series 5, image 11). Unchanged size of a thin chronic subdural hematoma overlying the left cerebral hemisphere, measuring up to 4 mm, deep to the left-sided cranioplasty. Subacute infarct within the left thalamus. Additional known small subacute infarcts within the supratentorial and infratentorial brain were better appreciated on the prior brain MRI of 11/25/2019. 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 Advanced patchy and ill-defined hypoattenuation within the cerebral white matter, nonspecific  but compatible with chronic small vessel ischemic disease. No acute demarcated cortical infarct. No evidence of intracranial mass. No midline shift. Vascular: No hyperdense vessel.  Atherosclerotic calcifications Skull: No calvarial fracture.  Left-sided cranioplasty. Sinuses: Scattered trace mucosal thickening and fluid within the bilateral ethmoid air cells. Small right maxillary sinus mucous retention cyst. Orbits: No mass or acute finding. Review of the MIP images confirms the above findings CTA NECK FINDINGS Aortic arch: Standard aortic branching. Atherosclerotic plaque within the visualized aortic arch and proximal major branch vessels of the neck. No hemodynamically significant innominate or proximal subclavian artery stenosis. Right carotid system: CCA and ICA patent within the neck without significant stenosis (50% or greater). Mild calcified plaque within the carotid bifurcation and proximal ICA. Left carotid system: CCA and ICA patent within the neck without significant stenosis (50% or greater). Mild soft and calcified plaque within the carotid bifurcation and proximal ICA. Vertebral arteries: Codominant patent within the neck without stenosis. Mild nonstenotic calcified plaque at the origin of the right vertebral artery. Skeleton: No acute bony abnormality or aggressive osseous lesion. Cervical spondylosis. Cervical levocurvature with partially imaged thoracic dextrocurvature. Other neck: Neck mass or cervical lymphadenopathy. Upper chest: Consolidation within portions of the right upper lobe and within imaged portions of the superior right lower lobe compatible with pneumonia. A small ill-defined opacity also present within the left lung apex likely reflecting an additional site of pneumonia. Prior median sternotomy. Review of the MIP images confirms the above findings CTA HEAD FINDINGS Anterior circulation: The intracranial internal carotid arteries are patent. Plaque within both vessels without  stenosis. Unchanged 2 mm inferomedially projecting aneurysm arising from the supraclinoid left ICA (series 12, image 122). The M1 middle cerebral arteries are patent. No M2 proximal branch occlusion or high-grade proximal stenosis is identified. The anterior cerebral arteries are patent. Posterior circulation: The intracranial vertebral arteries are patent. The basilar artery is patent. The posterior cerebral arteries are patent. Posterior communicating arteries are hypoplastic or absent bilaterally. Venous sinuses: Within the limitations of contrast timing, no convincing thrombus. Anatomic variants: As described Review of the MIP images confirms the above findings Acute CT head findings called by telephone at the time of interpretation on 12/20/2020 at 7:55 pm to provider [facility] , who verbally acknowledged these results. IMPRESSION: CT head: 1. Acute parafalcine subdural hematoma measuring up to 10 mm in greatest thickness. 2. Unchanged thin chronic subdural hematoma overlying the left cerebral hemisphere, deep to the left-sided cranioplasty (4 mm in thickness). 3. Subacute infarct within the left thalamus. Additional known small subacute infarcts within the supratentorial and infratentorial brain were better appreciated on the prior brain MRI of 11/25/2019. 4. 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 5. Stable parenchymal atrophy and  severe cerebral white matter chronic small vessel ischemic disease. 6. Mild paranasal sinus disease, as described. CTA neck: 1. The common carotid, internal carotid and vertebral arteries are patent within the neck without hemodynamically significant stenosis. Atherosclerotic disease, as described. 2. Findings compatible right upper lobe, right lower lobe and left upper lobe pneumonia. Right lower lobe pneumonia is incompletely imaged. CTA head: 1. No intracranial large vessel occlusion or proximal high-grade arterial stenosis. 2. 2 mm aneurysm  arising from the supraclinoid left ICA, unchanged from the CTA of 11/25/2019. Electronically Signed   By: Kellie Simmering DO   On: 12/20/2020 19:57   DG Knee Complete 4 Views Left  Result Date: 12/20/2020 CLINICAL DATA:  Pain following fall EXAM: LEFT KNEE - COMPLETE 4+ VIEW COMPARISON:  None. FINDINGS: Frontal, lateral, and bilateral oblique views were obtained. There is no fracture or dislocation. No joint effusion. There is moderate narrowing medially and in the patellofemoral joint region. There is spurring in all compartments. No erosive changes. There are foci of arterial vascular calcification. 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 No fracture, dislocation, or effusion. Atherosclerotic arterial vascular calcifications noted. Electronically Signed   By: Lowella Grip III M.D.   On: 12/20/2020 16:28   DG Knee Complete 4 Views Right  Result Date: 12/20/2020 CLINICAL DATA:  Pain following fall EXAM: RIGHT KNEE - COMPLETE 4+ VIEW COMPARISON:  None. FINDINGS: Frontal, lateral, and bilateral oblique views were obtained. No appreciable fracture or dislocation. No joint effusion. There is moderate narrowing of the patellofemoral joint. There is mild narrowing medially. There is spurring in all compartments. No erosive change. There are multiple foci of arterial vascular calcification. Surgical clips are noted medial to the proximal tibia. IMPRESSION: Osteoarthritic change early, primarily in the patellofemoral joint and medial compartments. No fracture, dislocation, or effusion. Foci of atherosclerotic arterial vascular calcification noted. Electronically Signed   By: Lowella Grip III M.D.   On: 12/20/2020 16:29    Assessment/Plan: 80 year old female presented to the ED tonight after sustaining several falls recently. CT head shows a small acute SDH along the falx with no midline shift or mass effect. No surgical intervention needed at this time. I would  recommend hospitalists admit for obs and treatment for pneumonia. Repeat head CT in the morning. No blood thinners for now please.  Ocie Cornfield Miranda Garber 12/20/2020 10:17 PM

## 2020-12-21 ENCOUNTER — Encounter (HOSPITAL_COMMUNITY): Payer: Self-pay | Admitting: Internal Medicine

## 2020-12-21 ENCOUNTER — Observation Stay (HOSPITAL_COMMUNITY): Payer: PPO

## 2020-12-21 ENCOUNTER — Inpatient Hospital Stay (HOSPITAL_COMMUNITY): Payer: PPO

## 2020-12-21 DIAGNOSIS — Z881 Allergy status to other antibiotic agents status: Secondary | ICD-10-CM | POA: Diagnosis not present

## 2020-12-21 DIAGNOSIS — E782 Mixed hyperlipidemia: Secondary | ICD-10-CM

## 2020-12-21 DIAGNOSIS — S065X0A Traumatic subdural hemorrhage without loss of consciousness, initial encounter: Secondary | ICD-10-CM | POA: Diagnosis present

## 2020-12-21 DIAGNOSIS — Y92009 Unspecified place in unspecified non-institutional (private) residence as the place of occurrence of the external cause: Secondary | ICD-10-CM | POA: Diagnosis not present

## 2020-12-21 DIAGNOSIS — R29700 NIHSS score 0: Secondary | ICD-10-CM | POA: Diagnosis present

## 2020-12-21 DIAGNOSIS — K219 Gastro-esophageal reflux disease without esophagitis: Secondary | ICD-10-CM

## 2020-12-21 DIAGNOSIS — Z85820 Personal history of malignant melanoma of skin: Secondary | ICD-10-CM | POA: Diagnosis not present

## 2020-12-21 DIAGNOSIS — D473 Essential (hemorrhagic) thrombocythemia: Secondary | ICD-10-CM | POA: Diagnosis present

## 2020-12-21 DIAGNOSIS — E538 Deficiency of other specified B group vitamins: Secondary | ICD-10-CM | POA: Diagnosis present

## 2020-12-21 DIAGNOSIS — I639 Cerebral infarction, unspecified: Secondary | ICD-10-CM | POA: Diagnosis not present

## 2020-12-21 DIAGNOSIS — N390 Urinary tract infection, site not specified: Secondary | ICD-10-CM | POA: Diagnosis present

## 2020-12-21 DIAGNOSIS — J440 Chronic obstructive pulmonary disease with acute lower respiratory infection: Secondary | ICD-10-CM | POA: Diagnosis present

## 2020-12-21 DIAGNOSIS — R402252 Coma scale, best verbal response, oriented, at arrival to emergency department: Secondary | ICD-10-CM | POA: Diagnosis present

## 2020-12-21 DIAGNOSIS — I6340 Cerebral infarction due to embolism of unspecified cerebral artery: Secondary | ICD-10-CM | POA: Diagnosis not present

## 2020-12-21 DIAGNOSIS — D696 Thrombocytopenia, unspecified: Secondary | ICD-10-CM | POA: Diagnosis present

## 2020-12-21 DIAGNOSIS — R402362 Coma scale, best motor response, obeys commands, at arrival to emergency department: Secondary | ICD-10-CM | POA: Diagnosis present

## 2020-12-21 DIAGNOSIS — J189 Pneumonia, unspecified organism: Secondary | ICD-10-CM | POA: Diagnosis not present

## 2020-12-21 DIAGNOSIS — I6381 Other cerebral infarction due to occlusion or stenosis of small artery: Secondary | ICD-10-CM | POA: Diagnosis present

## 2020-12-21 DIAGNOSIS — Z880 Allergy status to penicillin: Secondary | ICD-10-CM | POA: Diagnosis not present

## 2020-12-21 DIAGNOSIS — J439 Emphysema, unspecified: Secondary | ICD-10-CM

## 2020-12-21 DIAGNOSIS — W19XXXA Unspecified fall, initial encounter: Secondary | ICD-10-CM | POA: Diagnosis present

## 2020-12-21 DIAGNOSIS — D72829 Elevated white blood cell count, unspecified: Secondary | ICD-10-CM | POA: Diagnosis not present

## 2020-12-21 DIAGNOSIS — I6389 Other cerebral infarction: Secondary | ICD-10-CM | POA: Diagnosis not present

## 2020-12-21 DIAGNOSIS — R402142 Coma scale, eyes open, spontaneous, at arrival to emergency department: Secondary | ICD-10-CM | POA: Diagnosis present

## 2020-12-21 DIAGNOSIS — I2510 Atherosclerotic heart disease of native coronary artery without angina pectoris: Secondary | ICD-10-CM | POA: Diagnosis not present

## 2020-12-21 DIAGNOSIS — D638 Anemia in other chronic diseases classified elsewhere: Secondary | ICD-10-CM | POA: Diagnosis present

## 2020-12-21 DIAGNOSIS — Z20822 Contact with and (suspected) exposure to covid-19: Secondary | ICD-10-CM | POA: Diagnosis present

## 2020-12-21 DIAGNOSIS — S065X9A Traumatic subdural hemorrhage with loss of consciousness of unspecified duration, initial encounter: Secondary | ICD-10-CM

## 2020-12-21 DIAGNOSIS — D469 Myelodysplastic syndrome, unspecified: Secondary | ICD-10-CM | POA: Diagnosis present

## 2020-12-21 DIAGNOSIS — Z66 Do not resuscitate: Secondary | ICD-10-CM | POA: Diagnosis present

## 2020-12-21 DIAGNOSIS — Z951 Presence of aortocoronary bypass graft: Secondary | ICD-10-CM | POA: Diagnosis not present

## 2020-12-21 DIAGNOSIS — Z8673 Personal history of transient ischemic attack (TIA), and cerebral infarction without residual deficits: Secondary | ICD-10-CM | POA: Diagnosis not present

## 2020-12-21 DIAGNOSIS — Z87412 Personal history of vulvar dysplasia: Secondary | ICD-10-CM | POA: Diagnosis not present

## 2020-12-21 DIAGNOSIS — J159 Unspecified bacterial pneumonia: Secondary | ICD-10-CM | POA: Diagnosis present

## 2020-12-21 HISTORY — DX: Unspecified place in unspecified non-institutional (private) residence as the place of occurrence of the external cause: Y92.009

## 2020-12-21 HISTORY — DX: Cerebral infarction, unspecified: I63.9

## 2020-12-21 HISTORY — DX: Unspecified fall, initial encounter: W19.XXXA

## 2020-12-21 LAB — HIV ANTIBODY (ROUTINE TESTING W REFLEX): HIV Screen 4th Generation wRfx: NONREACTIVE

## 2020-12-21 LAB — CBC WITH DIFFERENTIAL/PLATELET
Abs Immature Granulocytes: 0.7 10*3/uL — ABNORMAL HIGH (ref 0.00–0.07)
Basophils Absolute: 0.2 10*3/uL — ABNORMAL HIGH (ref 0.0–0.1)
Basophils Relative: 1 %
Eosinophils Absolute: 0 10*3/uL (ref 0.0–0.5)
Eosinophils Relative: 0 %
HCT: 31.5 % — ABNORMAL LOW (ref 36.0–46.0)
Hemoglobin: 9.5 g/dL — ABNORMAL LOW (ref 12.0–15.0)
Lymphocytes Relative: 4 %
Lymphs Abs: 0.9 10*3/uL (ref 0.7–4.0)
MCH: 30.8 pg (ref 26.0–34.0)
MCHC: 30.2 g/dL (ref 30.0–36.0)
MCV: 102.3 fL — ABNORMAL HIGH (ref 80.0–100.0)
Metamyelocytes Relative: 3 %
Monocytes Absolute: 0.9 10*3/uL (ref 0.1–1.0)
Monocytes Relative: 4 %
Neutro Abs: 20.4 10*3/uL — ABNORMAL HIGH (ref 1.7–7.7)
Neutrophils Relative %: 88 %
Platelets: 154 10*3/uL (ref 150–400)
RBC: 3.08 MIL/uL — ABNORMAL LOW (ref 3.87–5.11)
RDW: 16.9 % — ABNORMAL HIGH (ref 11.5–15.5)
WBC: 23.2 10*3/uL — ABNORMAL HIGH (ref 4.0–10.5)
nRBC: 0.1 % (ref 0.0–0.2)
nRBC: 1 /100 WBC — ABNORMAL HIGH

## 2020-12-21 LAB — COMPREHENSIVE METABOLIC PANEL
ALT: 9 U/L (ref 0–44)
AST: 13 U/L — ABNORMAL LOW (ref 15–41)
Albumin: 3 g/dL — ABNORMAL LOW (ref 3.5–5.0)
Alkaline Phosphatase: 66 U/L (ref 38–126)
Anion gap: 10 (ref 5–15)
BUN: 11 mg/dL (ref 8–23)
CO2: 22 mmol/L (ref 22–32)
Calcium: 8.5 mg/dL — ABNORMAL LOW (ref 8.9–10.3)
Chloride: 106 mmol/L (ref 98–111)
Creatinine, Ser: 0.94 mg/dL (ref 0.44–1.00)
GFR, Estimated: >60 mL/min (ref >60)
Glucose, Bld: 97 mg/dL (ref 70–99)
Potassium: 4.3 mmol/L (ref 3.5–5.1)
Sodium: 138 mmol/L (ref 135–145)
Total Bilirubin: 0.3 mg/dL (ref 0.3–1.2)
Total Protein: 6.8 g/dL (ref 6.5–8.1)

## 2020-12-21 LAB — URINALYSIS, ROUTINE W REFLEX MICROSCOPIC
Bacteria, UA: NONE SEEN
Bilirubin Urine: NEGATIVE
Glucose, UA: NEGATIVE mg/dL
Hgb urine dipstick: NEGATIVE
Ketones, ur: NEGATIVE mg/dL
Nitrite: NEGATIVE
Protein, ur: NEGATIVE mg/dL
Specific Gravity, Urine: 1.041 — ABNORMAL HIGH (ref 1.005–1.030)
pH: 5 (ref 5.0–8.0)

## 2020-12-21 LAB — APTT: aPTT: 33 seconds (ref 24–36)

## 2020-12-21 LAB — ECHOCARDIOGRAM COMPLETE BUBBLE STUDY
AR max vel: 2.24 cm2
AV Area VTI: 2.04 cm2
AV Area mean vel: 2.19 cm2
AV Mean grad: 7 mmHg
AV Peak grad: 14.2 mmHg
Ao pk vel: 1.89 m/s
Area-P 1/2: 1.62 cm2
Calc EF: 62.9 %
MV M vel: 3.31 m/s
MV Peak grad: 43.8 mmHg
MV VTI: 2.03 cm2
S' Lateral: 1.7 cm
Single Plane A2C EF: 70.6 %
Single Plane A4C EF: 56.8 %

## 2020-12-21 LAB — PROTIME-INR
INR: 1.3 — ABNORMAL HIGH (ref 0.8–1.2)
Prothrombin Time: 15.8 seconds — ABNORMAL HIGH (ref 11.4–15.2)

## 2020-12-21 LAB — LIPID PANEL
Cholesterol: 74 mg/dL (ref 0–200)
HDL: 28 mg/dL — ABNORMAL LOW (ref >40)
LDL Cholesterol: 25 mg/dL (ref 0–99)
Total CHOL/HDL Ratio: 2.6 RATIO
Triglycerides: 104 mg/dL (ref <150)
VLDL: 21 mg/dL (ref 0–40)

## 2020-12-21 LAB — FOLATE: Folate: >20

## 2020-12-21 LAB — C-REACTIVE PROTEIN: CRP: 2.3 mg/dL — ABNORMAL HIGH (ref <1.0)

## 2020-12-21 LAB — PROCALCITONIN: Procalcitonin: <0.1 ng/mL

## 2020-12-21 LAB — HEMOGLOBIN A1C
Hgb A1c MFr Bld: 5.8 % — ABNORMAL HIGH (ref 4.8–5.6)
Mean Plasma Glucose: 119.76 mg/dL

## 2020-12-21 IMAGING — CT CT HEAD W/O CM
4 series · 16 of 47 positions shown, 18 images · non-contrast
Comparison: Brain MRI from earlier today

CLINICAL DATA: Subdural hemorrhage.  Stroke follow-up

EXAM:
CT HEAD WITHOUT CONTRAST
TECHNIQUE: Contiguous axial images were obtained from the base of the skull
through the vertex without intravenous contrast.

[Series 3: head bone · axial · 0.47mm/px · z∈[-122,-86]mm · 3 of 94 slices shown]
[im 10/94  bone]
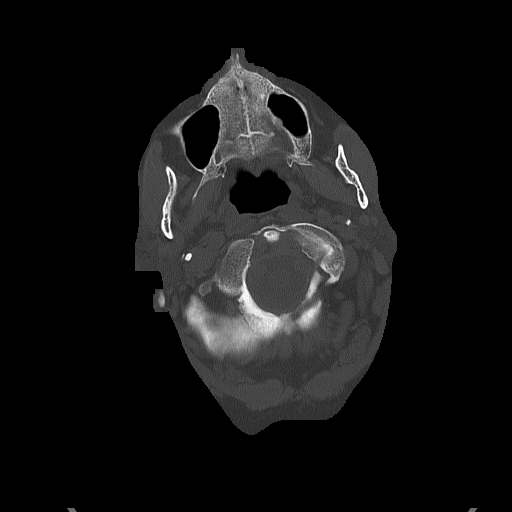
[im 19/94  bone]
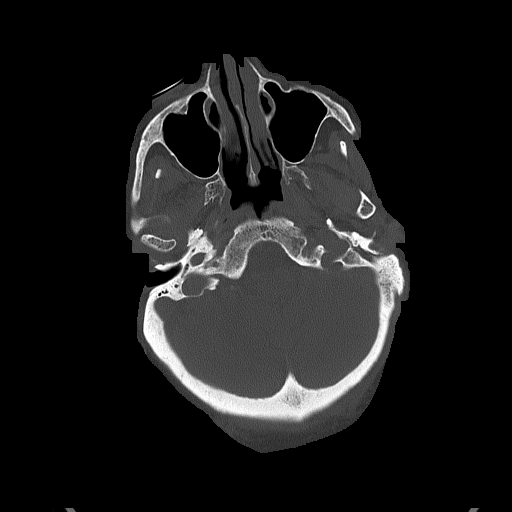
[im 28/94  bone]
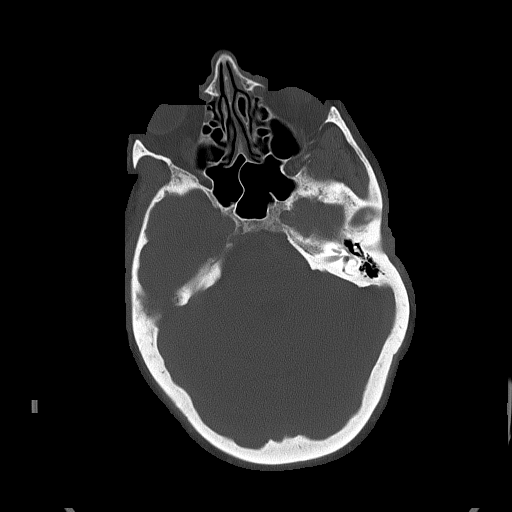

[Series 4: head without · axial · non-contrast · 0.47mm/px · z∈[-120,+20]mm · 7 of 38 slices shown, 9 images]
[im 5/38  brain]
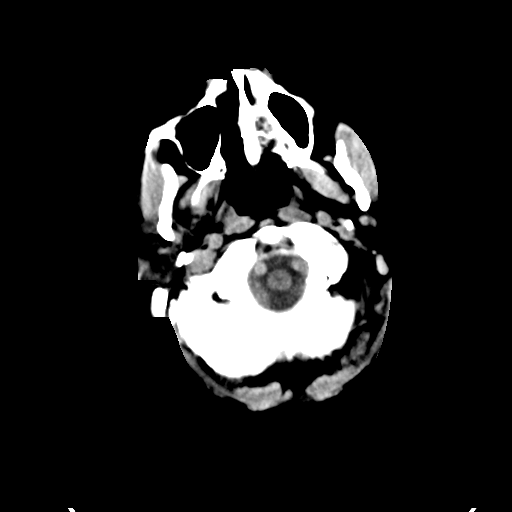
[im 5/38  bone]
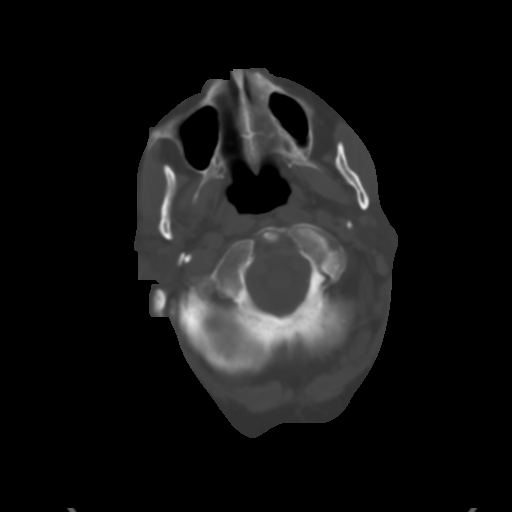
[im 10/38  brain]
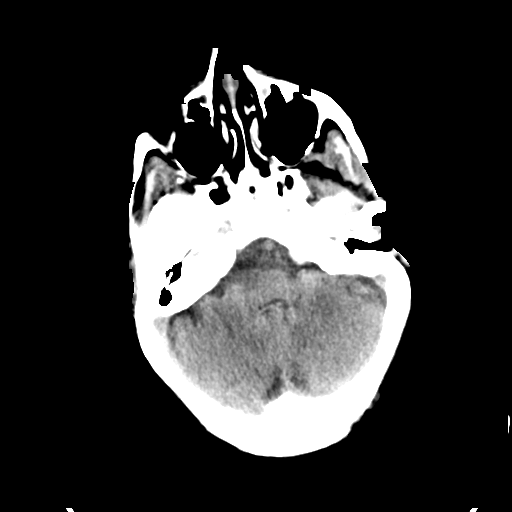
[im 14/38  brain]
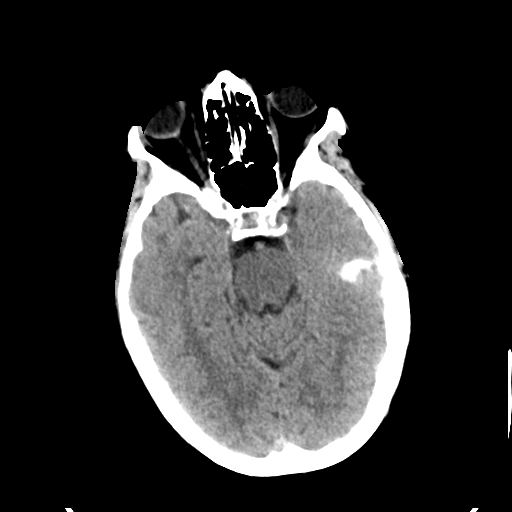
[im 19/38  brain]
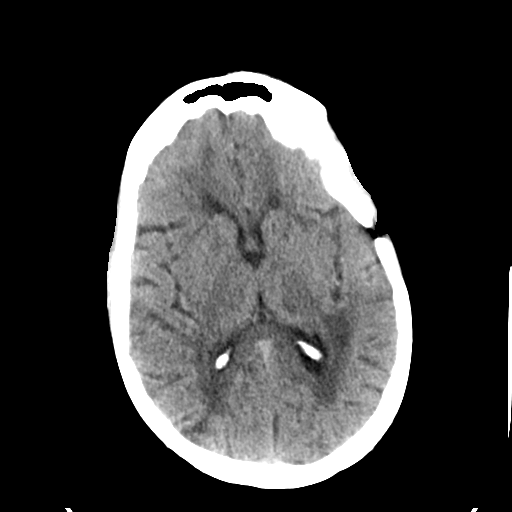
[im 24/38  brain]
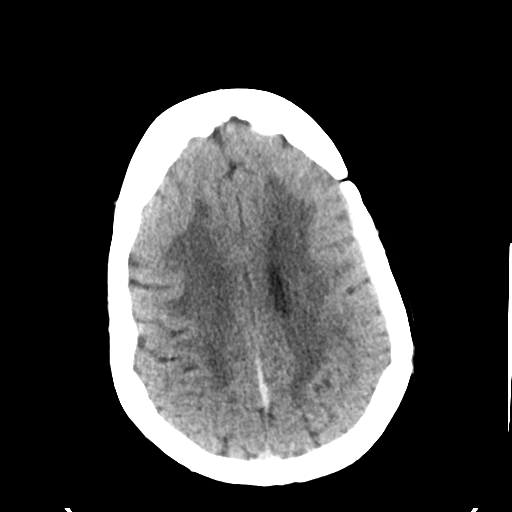
[im 24/38  bone]
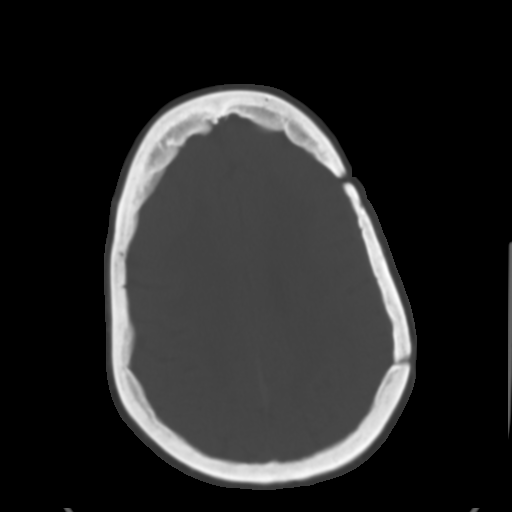
[im 28/38  brain]
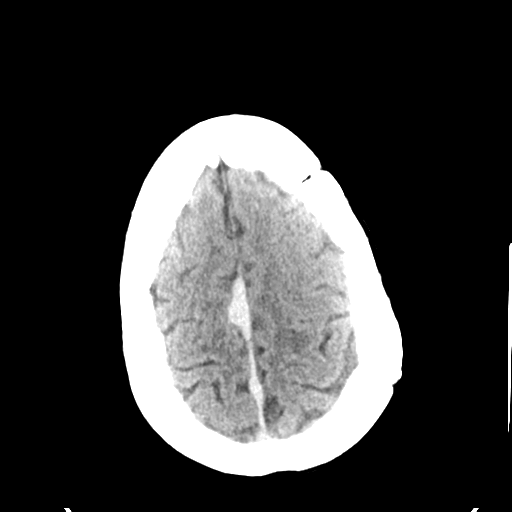
[im 33/38  brain]
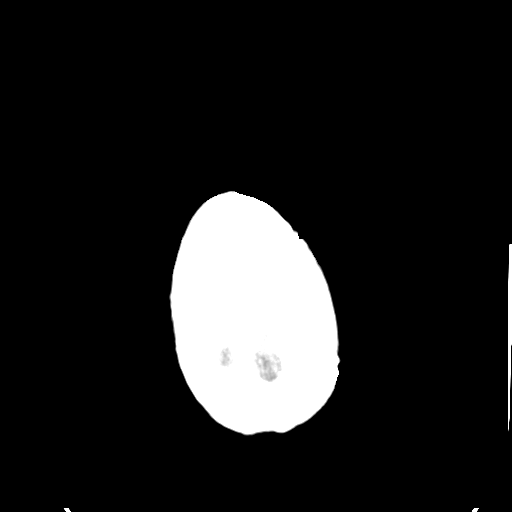

[Series 5: head without cor · coronal · non-contrast · 0.32mm/px · 3 of 72 slices shown]
[im 24/72  brain]
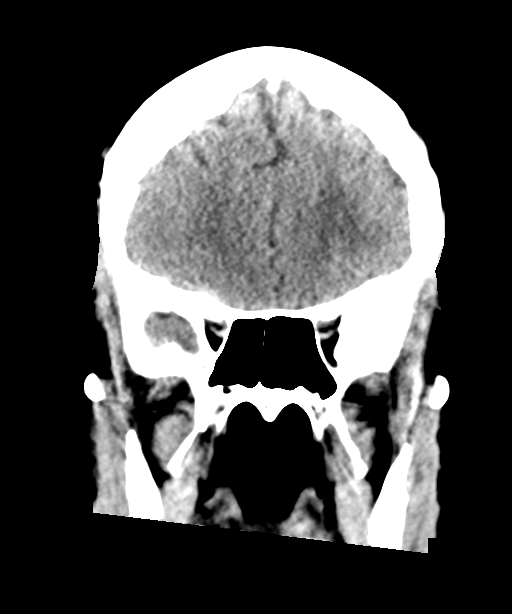
[im 32/72  brain]
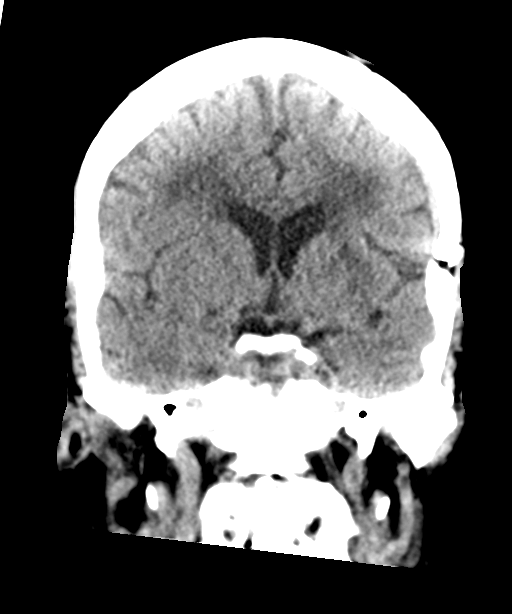
[im 40/72  brain]
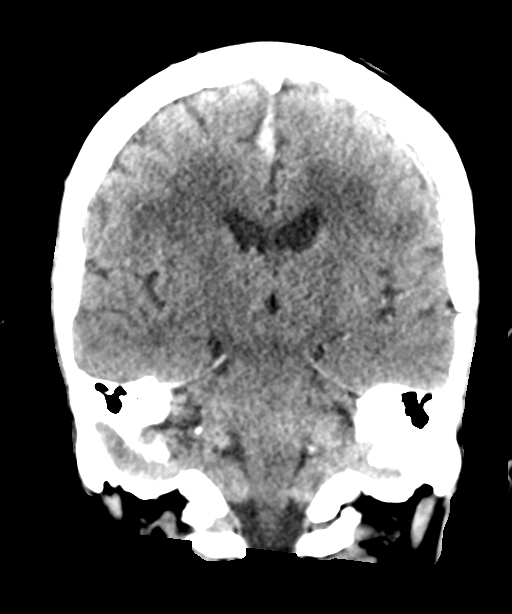

[Series 6: head without sag · sagittal · non-contrast · 0.37mm/px · 3 of 65 slices shown]
[im 25/65  brain]
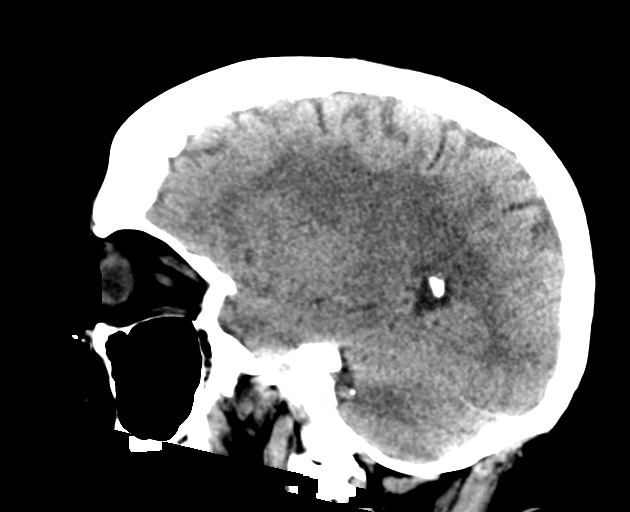
[im 33/65  brain]
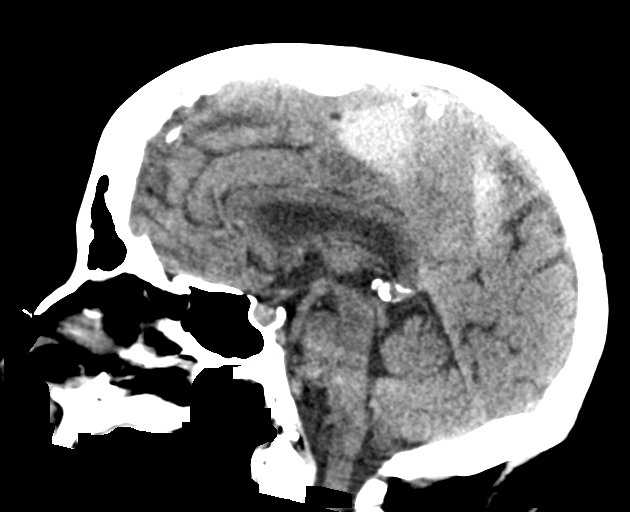
[im 40/65  brain]
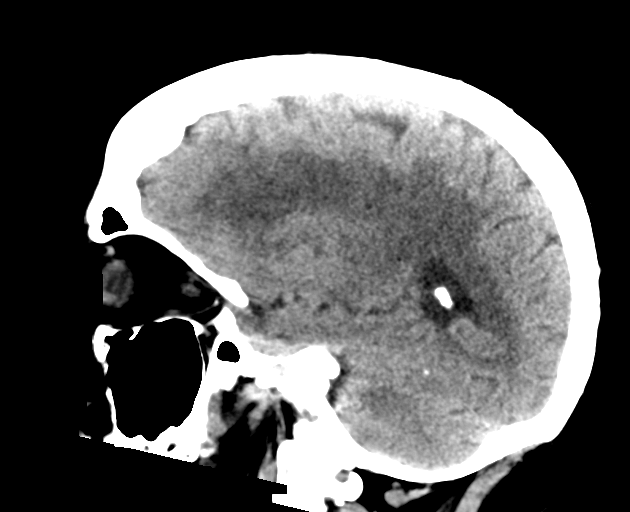

[16 of 47 positions shown; findings below may reference images not displayed]

FINDINGS: Brain: High-density subdural hematoma along the falx measuring up to
8 mm in thickness, non progressed. No associated significant mass
effect. Extensive acute and chronic ischemia as characterized by MRI
earlier the same day. No hydrocephalus or shift.

Vascular: Negative

Skull: Unremarkable remote left craniotomy.

Sinuses/Orbits: Bilateral cataract resection
IMPRESSION: 1. No interval progression of the inter hemispheric subdural
hematoma which measures up to 8 mm in thickness.
2. Acute and chronic ischemia, reference preceding brain MRI.

## 2020-12-21 IMAGING — MR MR HEAD W/O CM
12 of 13 series · 44 of 48 positions shown · non-contrast
Comparison: Head CT [DATE]

CLINICAL DATA: Stroke symptoms and parafalcine subdural hematoma

EXAM:
MRI HEAD WITHOUT CONTRAST
TECHNIQUE: Multiplanar, multiecho pulse sequences of the brain and surrounding
structures were obtained without intravenous contrast.

[Series 5: DWI · axial · 3.0mm · 0.88mm/px · z∈[-111,+46]mm · 7 of 108 slices shown (1 of 4)]
[im 1/108]
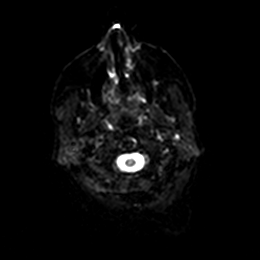
[im 18/108]
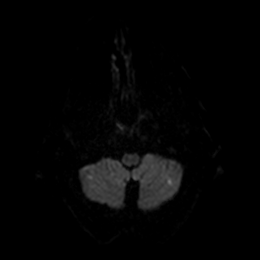
[im 36/108]
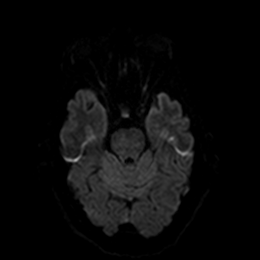
[im 54/108]
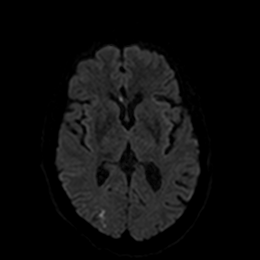
[im 72/108]
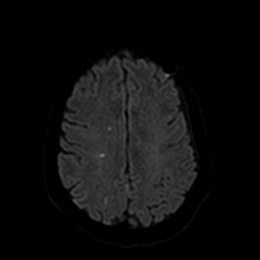
[im 90/108]
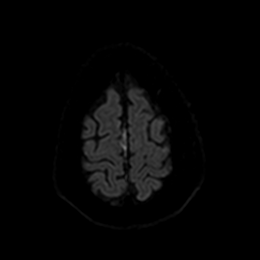
[im 108/108]
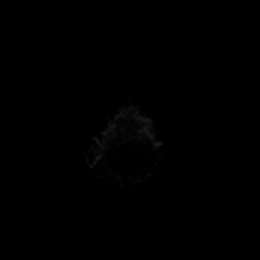

[Series 6: DWI · axial · 3.0mm · 0.88mm/px · z∈[-111,+46]mm · 4 of 54 slices shown (2 of 4)]
[im 1/54]
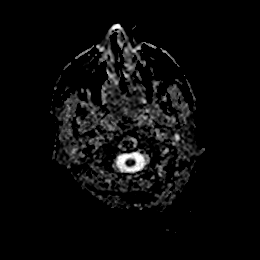
[im 18/54]
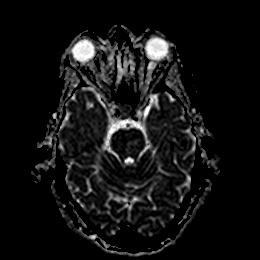
[im 36/54]
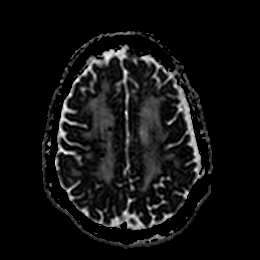
[im 54/54]
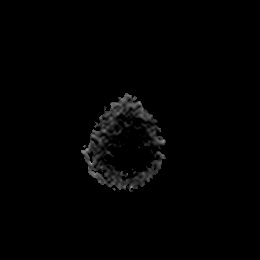

[Series 7: DWI · coronal · 4.0mm · 0.88mm/px · 6 of 76 slices shown (3 of 4)]
[im 1/76]
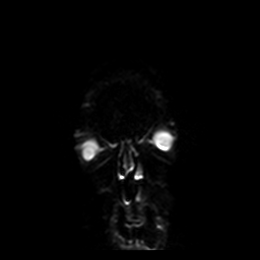
[im 16/76]
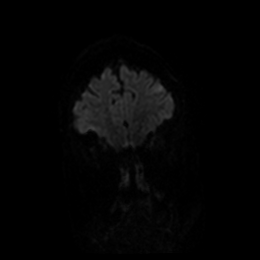
[im 31/76]
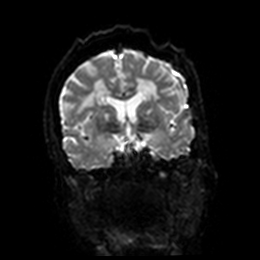
[im 46/76]
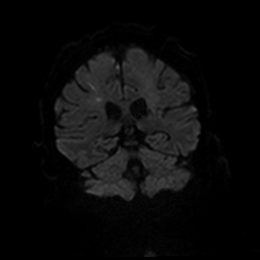
[im 61/76]
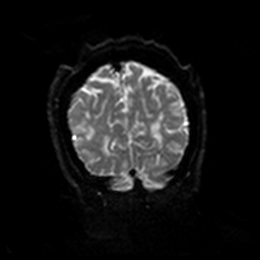
[im 76/76]
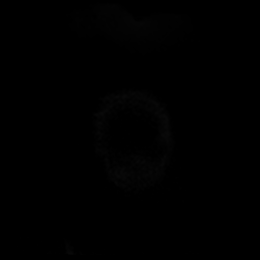

[Series 8: DWI · coronal · 4.0mm · 0.88mm/px · 3 of 38 slices shown (4 of 4)]
[im 1/38]
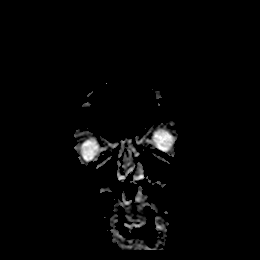
[im 19/38]
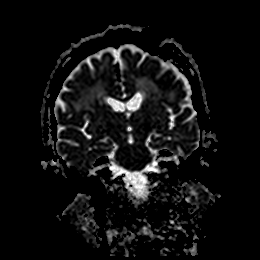
[im 38/38]
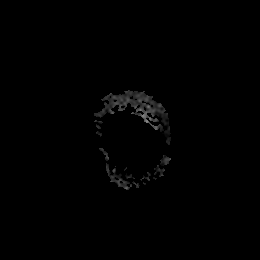

[Series 9: T1 · sagittal · 5.0mm · 0.75mm/px · 2 of 23 slices shown]
[im 1/23]
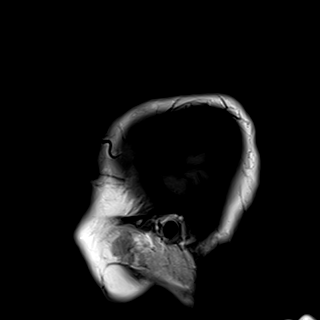
[im 23/23]
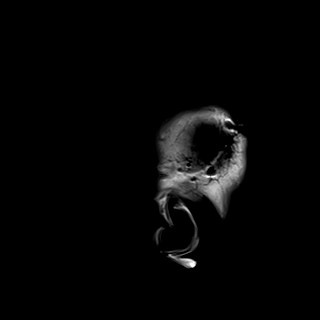

[Series 10: T2 · axial · 5.0mm · 0.72mm/px · z∈[-109,+45]mm · 2 of 27 slices shown (1 of 2)]
[im 1/27]
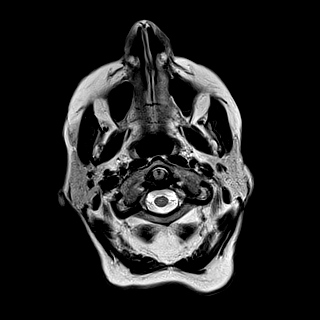
[im 27/27]
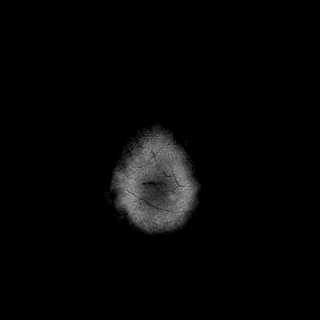

[Series 11: FLAIR · axial · 5.0mm · 0.45mm/px · z∈[-107,+47]mm · 2 of 27 slices shown]
[im 1/27]
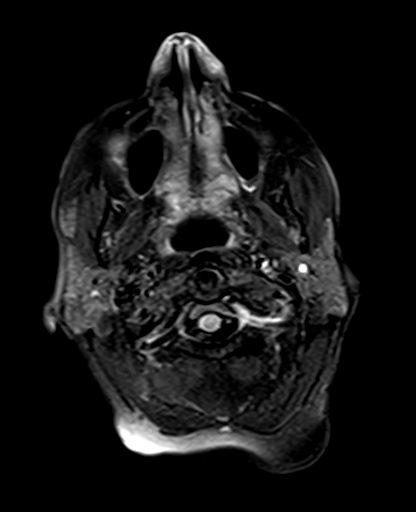
[im 27/27]
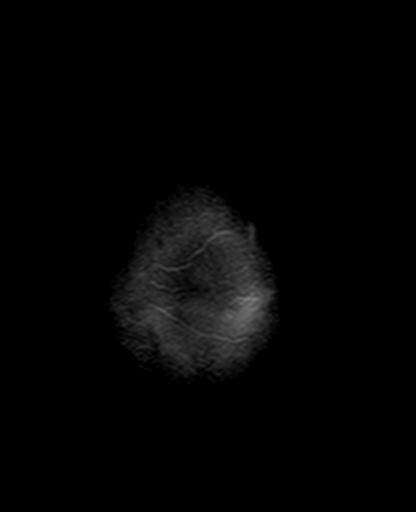

[Series 12: mag_images · axial · 3.0mm · 0.90mm/px · z∈[-111,+52]mm · 4 of 56 slices shown]
[im 1/56]
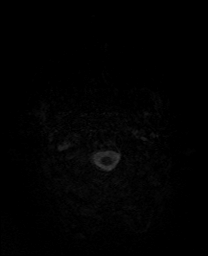
[im 19/56]
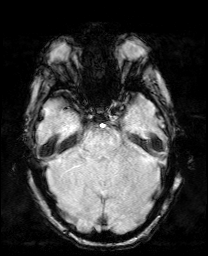
[im 37/56]
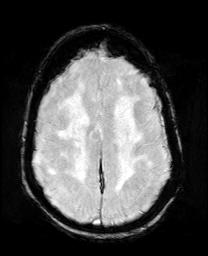
[im 56/56]
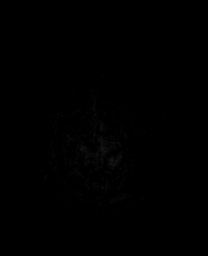

[Series 13: pha_images · axial · 3.0mm · 0.90mm/px · z∈[-111,+52]mm · 4 of 56 slices shown]
[im 1/56]
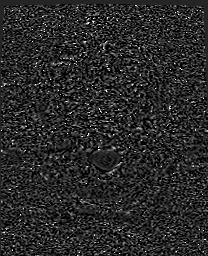
[im 19/56]
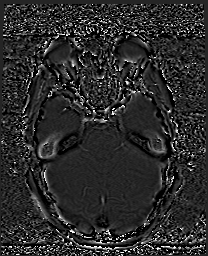
[im 37/56]
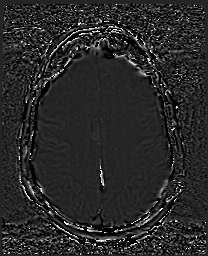
[im 56/56]
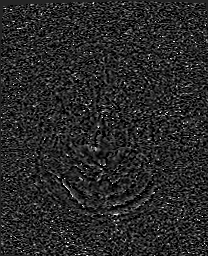

[Series 14: swi_images · axial · 3.0mm · 0.90mm/px · z∈[-111,+52]mm · 4 of 56 slices shown]
[im 1/56]
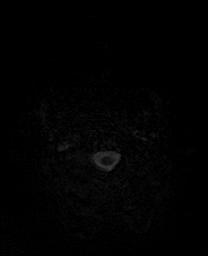
[im 19/56]
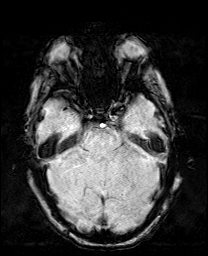
[im 37/56]
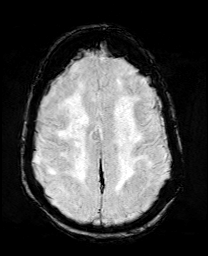
[im 56/56]
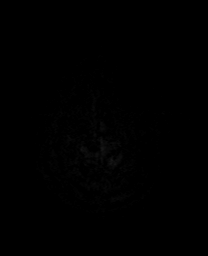

[Series 15: mip_images(sw) · axial · 24.0mm · 0.90mm/px · z∈[-101,+41]mm · 4 of 49 slices shown]
[im 1/49]
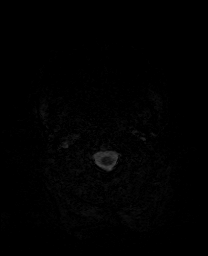
[im 17/49]
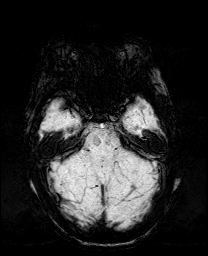
[im 33/49]
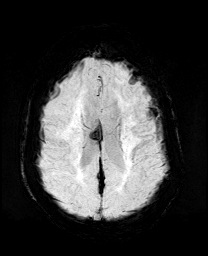
[im 49/49]
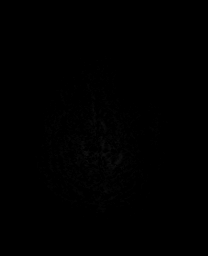

[Series 17: T2 · coronal · 5.0mm · 0.34mm/px · 2 of 32 slices shown (2 of 2)]
[im 1/32]
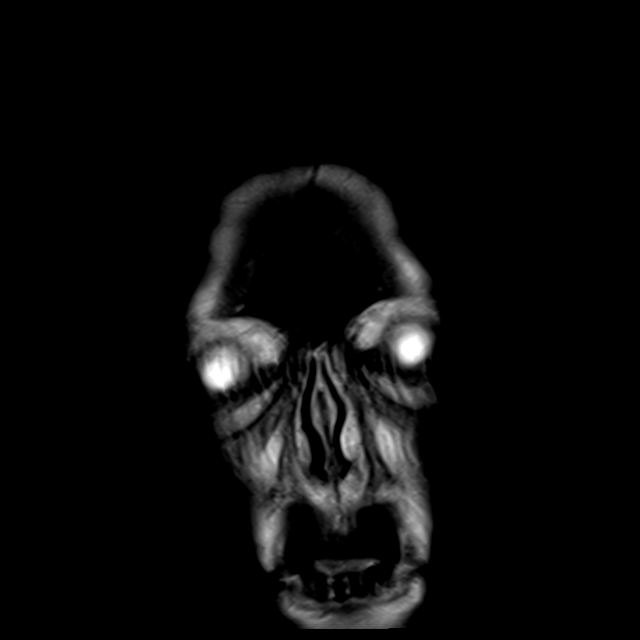
[im 32/32]
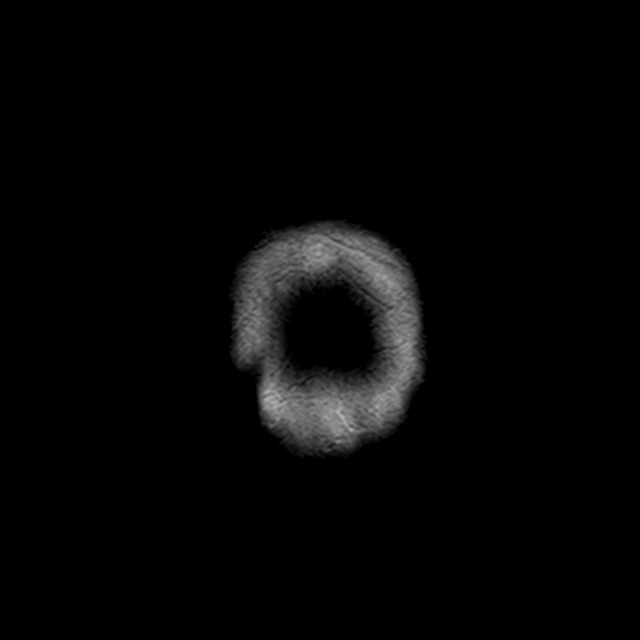

[44 of 48 positions shown; findings below may reference images not displayed]

FINDINGS: Brain: There is multifocal acute ischemia throughout both cerebral
and cerebellar hemispheres. The pattern is most suggestive watershed
infarcts. The subdural hematoma along the falx cerebri is unchanged.
Hyperintense T2-weighted signal is moderately widespread throughout
the white matter. Generalized volume loss without a clear lobar
predilection. The midline structures are normal.

Vascular: Major flow voids are preserved.

Skull and upper cervical spine: Remote left pterional craniotomy.

Sinuses/Orbits:No paranasal sinus fluid levels or advanced mucosal
thickening. No mastoid or middle ear effusion. Normal orbits.
IMPRESSION: 1. Multifocal acute ischemia throughout both cerebral and cerebellar
hemispheres, in a pattern most suggestive of watershed infarcts.
2. Unchanged subdural hematoma along the falx cerebri.

## 2020-12-21 IMAGING — CT CT CHEST-ABD-PELV W/ CM
2 of 7 series · 15 of 46 positions shown, 17 images · IV contrast (APPLIED)
Comparison: [DATE], [DATE]

CLINICAL DATA: Multiple falls yesterday

EXAM:
CT CHEST, ABDOMEN, AND PELVIS WITH CONTRAST
TECHNIQUE: Multidetector CT imaging of the chest, abdomen and pelvis was
performed following the standard protocol during bolus
administration of intravenous contrast.
CONTRAST:  100mL OMNIPAQUE IOHEXOL 300 MG/ML  SOLN

[Series 3: abd/ pelvis 5.0 i30f 2 · axial · 0.66mm/px · z∈[+770,+1175]mm · 12 of 93 slices shown, 14 images]
[im 6/93  soft-tissue]
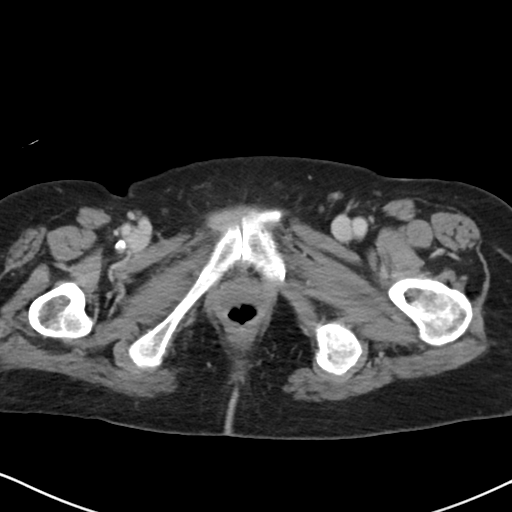
[im 6/93  bone]
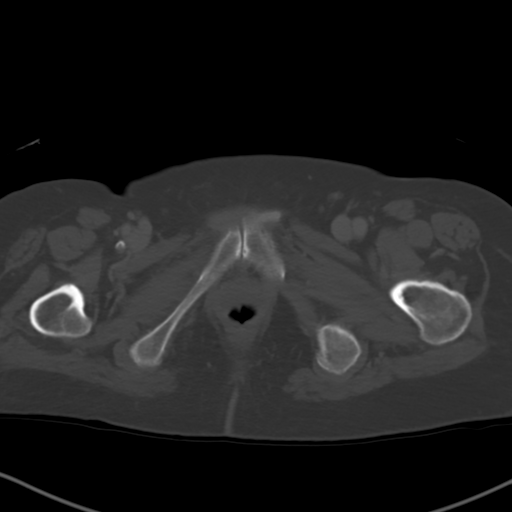
[im 12/93  soft-tissue]
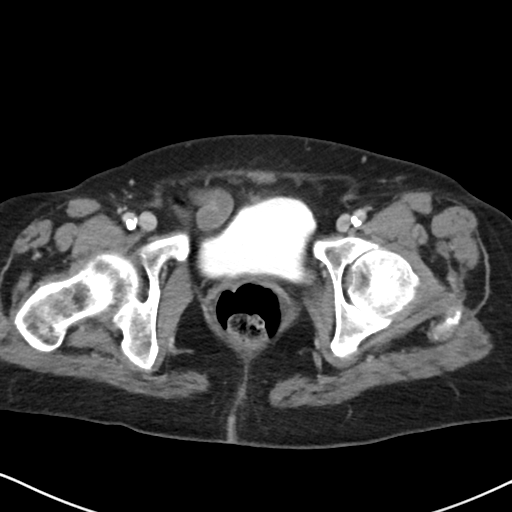
[im 24/93  soft-tissue]
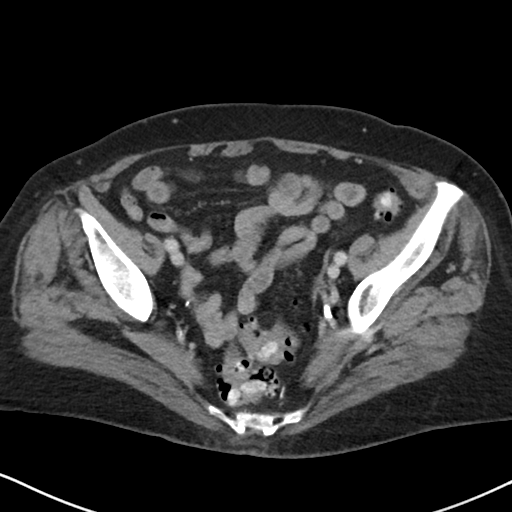
[im 29/93  soft-tissue]
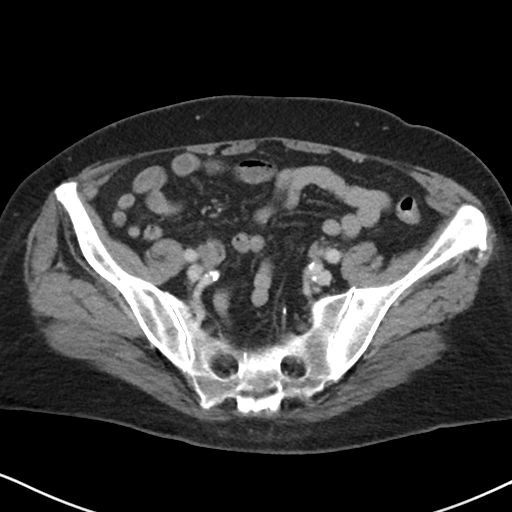
[im 35/93  soft-tissue]
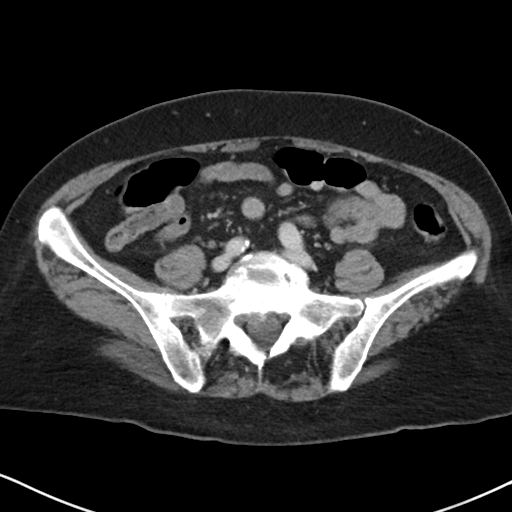
[im 41/93  soft-tissue]
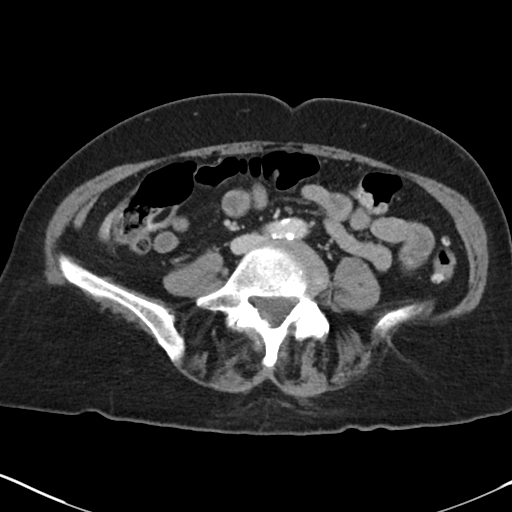
[im 52/93  soft-tissue]
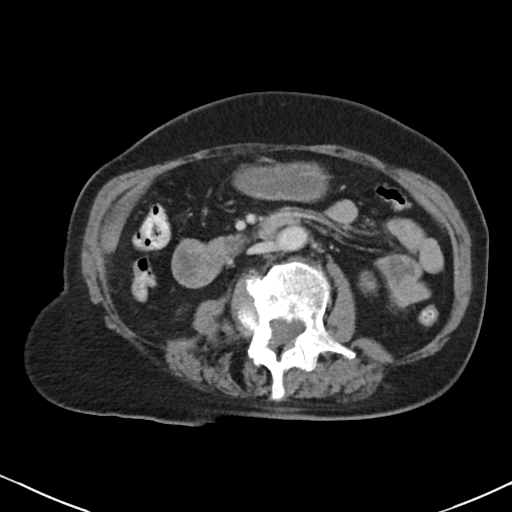
[im 58/93  soft-tissue]
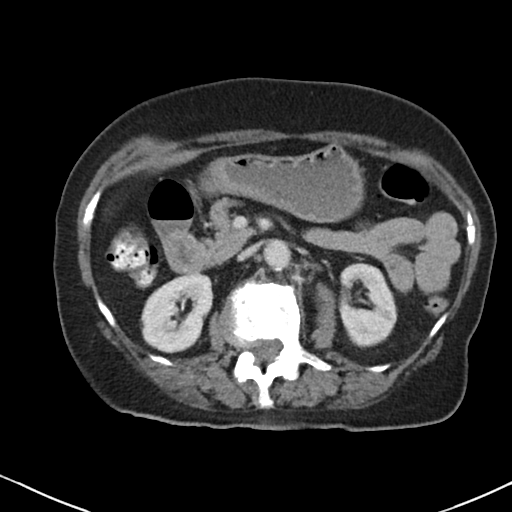
[im 64/93  soft-tissue]
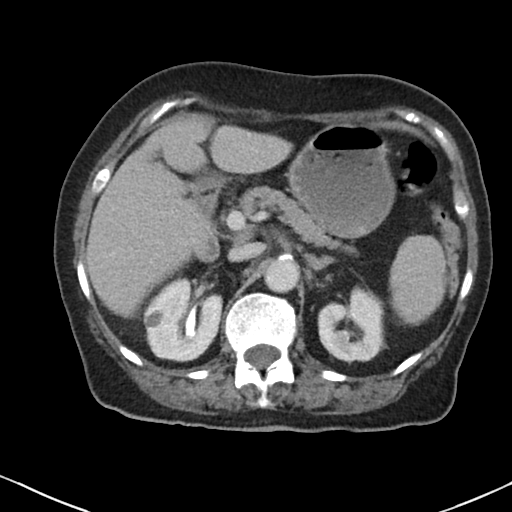
[im 64/93  bone]
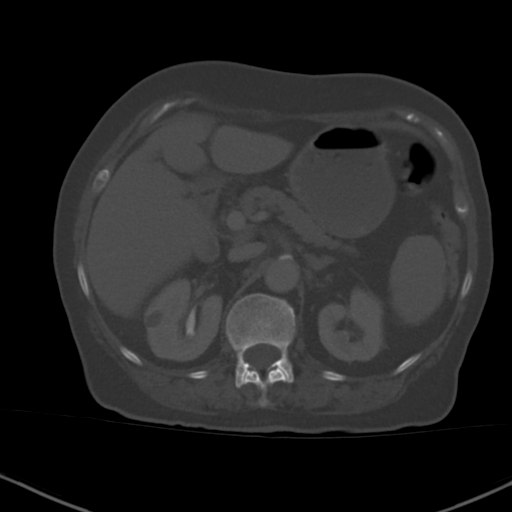
[im 70/93  soft-tissue]
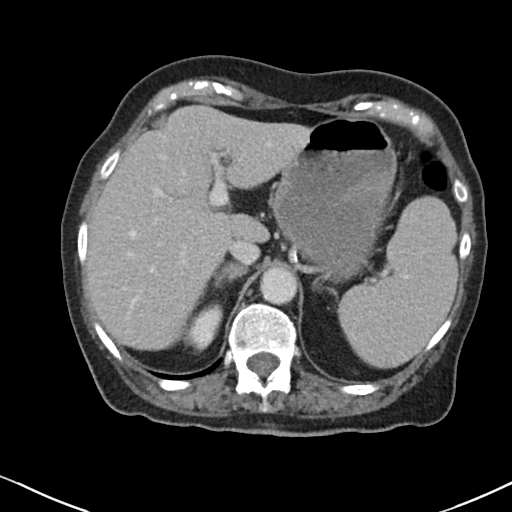
[im 81/93  soft-tissue]
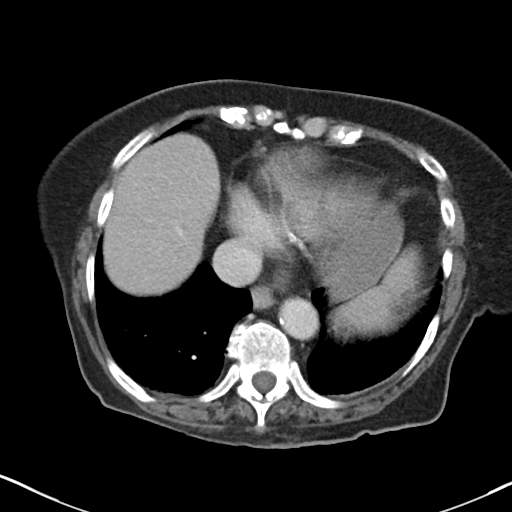
[im 87/93  soft-tissue]
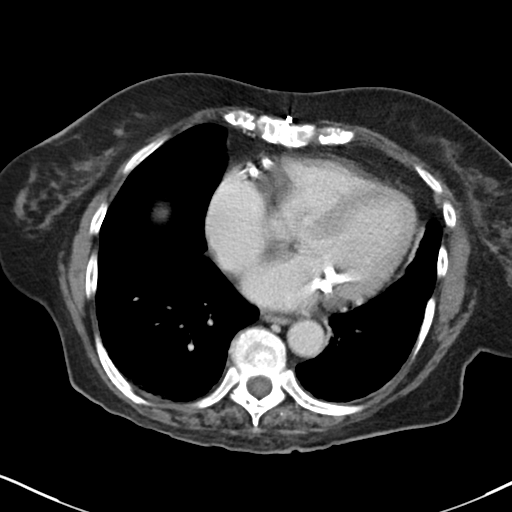

[Series 9: coronal soft tissue · coronal · 0.56mm/px · 3 of 83 slices shown]
[im 21/83  soft-tissue]
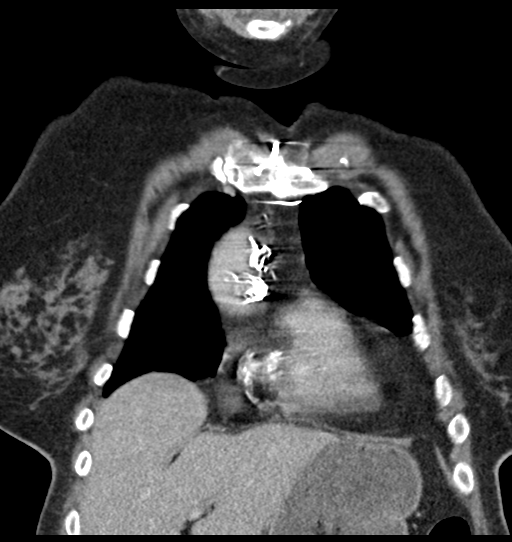
[im 42/83  soft-tissue]
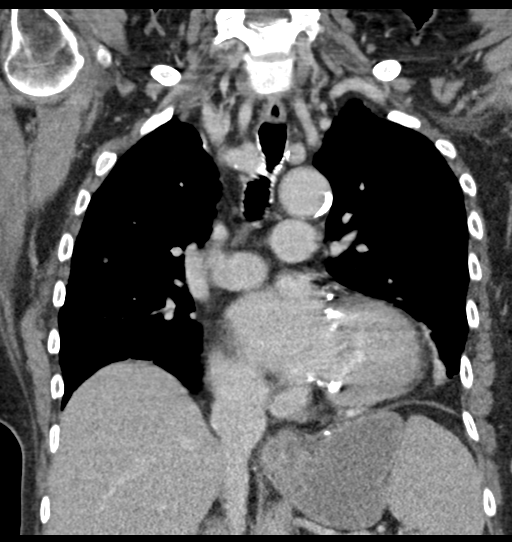
[im 62/83  soft-tissue]
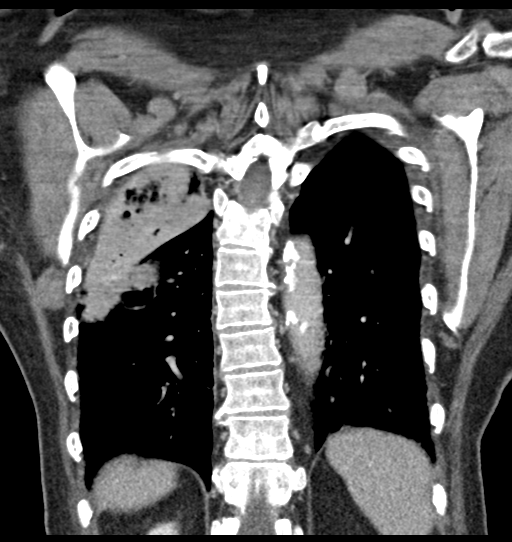

[15 of 46 positions shown; findings below may reference images not displayed]

FINDINGS: CT CHEST FINDINGS

Cardiovascular: The heart and great vessels are unremarkable with no
pericardial effusion. Dense calcification of the mitral annulus.
Extensive atherosclerosis of the native coronary vessels, with
evidence of prior CABG. No evidence of thoracic aortic aneurysm or
dissection. Moderate atherosclerosis of the aortic arch.

Mediastinum/Nodes: No enlarged mediastinal, hilar, or axillary lymph
nodes. Thyroid gland, trachea, and esophagus demonstrate no
significant findings.

Lungs/Pleura: There is a 6 mm right upper lobe pulmonary nodule
reference image [DATE], and a 5 mm right middle lobe pulmonary nodule
reference image 39/6. These are unchanged since [HL] and can be
considered benign.

There is dense consolidation within the right upper lobe and
superior segment right lower lobe with air bronchograms, compatible
with pneumonia. Nodular area of consolidation within the superior
segment left lower lobe image [DATE] measures 9 mm, and may reflect an
additional focus of infection though follow-up CT will be needed to
assess resolution.

No effusion or pneumothorax.  The central airways are patent.

Musculoskeletal: There are no acute or destructive bony lesions.
Reconstructed images demonstrate no additional findings.

CT ABDOMEN PELVIS FINDINGS

Hepatobiliary: High attenuation material within the gallbladder
likely reflects vicarious excretion of previously administered
contrast. No evidence of cholelithiasis or cholecystitis. Liver is
unremarkable.

Pancreas: Unremarkable. No pancreatic ductal dilatation or
surrounding inflammatory changes.

Spleen: Borderline splenomegaly measuring 12.1 cm in
anterior-posterior dimension, stable. No focal abnormalities.

Adrenals/Urinary Tract: Stable hypodensities right kidney compatible
with small cysts. No urinary tract calculi or obstructive uropathy.
Excreted contrast is seen within the urinary bladder, with no
filling defects. The adrenals are unremarkable.

Stomach/Bowel: No bowel obstruction or ileus. Diffuse diverticulosis
of the distal colon without diverticulitis. No bowel wall thickening
or inflammatory change.

Vascular/Lymphatic: Aortic atherosclerosis. No enlarged abdominal or
pelvic lymph nodes.

Reproductive: Status post hysterectomy. No adnexal masses.

Other: No free fluid or free gas.  No abdominal wall hernia.

Musculoskeletal: No acute or destructive bony lesions. Reconstructed
images demonstrate no additional findings.
IMPRESSION: 1. No acute intrathoracic, intra-abdominal, or intrapelvic trauma.
2. Multifocal pneumonia, most pronounced in the right upper and
right lower lobes.
3. Nodular consolidation within the superior segment left lower lobe
measuring 9 mm, favor infection. CT follow-up will be needed after
appropriate medical management to document resolution.
4. Benign subcentimeter nodules within the right upper and right
middle lobes.
5. Borderline splenomegaly.
6.  Aortic Atherosclerosis ([HL]-[HL]).

## 2020-12-21 MED ORDER — IOHEXOL 300 MG/ML  SOLN
75.0000 mL | Freq: Once | INTRAMUSCULAR | Status: AC | PRN
  Administered 2020-12-21: 100 mL via INTRAVENOUS

## 2020-12-21 MED ORDER — ONDANSETRON HCL 4 MG/2ML IJ SOLN: 4.0000 mg | Freq: Four times a day (QID) | INTRAMUSCULAR | Status: DC | PRN

## 2020-12-21 MED ORDER — SODIUM CHLORIDE 0.9 % IV SOLN
2.0000 g | INTRAVENOUS | Status: AC
  Administered 2020-12-21 – 2020-12-25 (×5): 2 g via INTRAVENOUS
  Filled 2020-12-21: qty 2
  Filled 2020-12-21 (×4): qty 20

## 2020-12-21 MED ORDER — POLYETHYLENE GLYCOL 3350 17 G PO PACK: 17.0000 g | PACK | Freq: Every day | ORAL | Status: DC | PRN

## 2020-12-21 MED ORDER — HYDRALAZINE HCL 20 MG/ML IJ SOLN: 10.0000 mg | Freq: Four times a day (QID) | INTRAMUSCULAR | Status: DC | PRN

## 2020-12-21 MED ORDER — ATORVASTATIN CALCIUM 40 MG PO TABS
40.0000 mg | ORAL_TABLET | Freq: Every day | ORAL | Status: DC
  Administered 2020-12-22 – 2020-12-29 (×8): 40 mg via ORAL
  Filled 2020-12-21 (×8): qty 1

## 2020-12-21 MED ORDER — FLUTICASONE PROPIONATE 50 MCG/ACT NA SUSP
2.0000 | Freq: Every day | NASAL | Status: DC
  Administered 2020-12-21 – 2020-12-28 (×8): 2 via NASAL
  Filled 2020-12-21 (×2): qty 16

## 2020-12-21 MED ORDER — ACETAMINOPHEN 650 MG RE SUPP: 650.0000 mg | RECTAL | Status: DC | PRN

## 2020-12-21 MED ORDER — PANTOPRAZOLE SODIUM 40 MG PO TBEC
40.0000 mg | DELAYED_RELEASE_TABLET | Freq: Every day | ORAL | Status: DC
  Administered 2020-12-21 – 2020-12-29 (×9): 40 mg via ORAL
  Filled 2020-12-21 (×9): qty 1

## 2020-12-21 MED ORDER — ATORVASTATIN CALCIUM 40 MG PO TABS: 80.0000 mg | ORAL_TABLET | Freq: Every day | ORAL | Status: DC

## 2020-12-21 MED ORDER — ACETAMINOPHEN 325 MG PO TABS: 650.0000 mg | ORAL_TABLET | ORAL | Status: DC | PRN

## 2020-12-21 MED ORDER — SODIUM CHLORIDE 0.9 % IV SOLN
500.0000 mg | INTRAVENOUS | Status: AC
  Administered 2020-12-21 – 2020-12-23 (×3): 500 mg via INTRAVENOUS
  Filled 2020-12-21 (×5): qty 500

## 2020-12-21 MED ORDER — TRAMADOL HCL 50 MG PO TABS
50.0000 mg | ORAL_TABLET | Freq: Four times a day (QID) | ORAL | Status: DC | PRN
Start: 2020-12-21 — End: 2020-12-29
  Administered 2020-12-25: 50 mg via ORAL
  Filled 2020-12-21: qty 1

## 2020-12-21 MED ORDER — ACETAMINOPHEN 160 MG/5ML PO SOLN: 650.0000 mg | ORAL | Status: DC | PRN

## 2020-12-21 MED ORDER — METOPROLOL TARTRATE 25 MG PO TABS
25.0000 mg | ORAL_TABLET | Freq: Two times a day (BID) | ORAL | Status: DC
  Administered 2020-12-21 – 2020-12-29 (×17): 25 mg via ORAL
  Filled 2020-12-21 (×17): qty 1

## 2020-12-21 MED ORDER — SENNOSIDES-DOCUSATE SODIUM 8.6-50 MG PO TABS: 1.0000 | ORAL_TABLET | Freq: Every day | ORAL | Status: DC | PRN

## 2020-12-21 MED ORDER — IPRATROPIUM-ALBUTEROL 0.5-2.5 (3) MG/3ML IN SOLN: 3.0000 mL | RESPIRATORY_TRACT | Status: DC | PRN

## 2020-12-21 NOTE — H&P (Signed)
History and Physical    Yolanda Flowers BMW:[id number] DOB: [birth year] DOA: 12/20/2020  PCP: [name], MD  Patient coming from: Home   Chief Complaint:  Chief Complaint  Patient presents with  . Fall     HPI:    80 year old female with past medical history of essential thrombocytosis (no longer on hydroxyuria), chronic leukocytosis , chronic subdural hematoma (left sided since 2008), melanoma (left little toe 06/2014), vulvar intraepithelial neoplasia (2017), gastroesophageal reflux disease, coronary artery disease (S/P CABG 10/2019), hypertension, COPD, hyperlipidemia and ischemic stroke (thought to be cardioembolic but source never identified - 11/2019) presenting to [facility] emergency department after experiencing progressively worsening cough, headache and frequent falls.  Patient is a somewhat poor historian.  Patient explains that approximately 2 weeks ago she fell while in a grocery store.  Patient denies any preceding loss of consciousness at that time but otherwise is unable to provide any details as to why she fell.  In the days that followed the patient began to develop associated headaches, moderate to severe in intensity, generalized and waxing and waning.  Patient is also developed increasing difficulty with balance and in particular patient has developed progressively worsening generalized weakness and malaise.  In the past 2 days patient has developed such severe weakness that she has fallen which she estimates to be at least 5 times.  Patient does remember hitting the back of her head on the floor at least on one occurrence.  Patient denies any associated loss of consciousness with any of these episodes.   Patient denies associated shortness of breath, fevers, sick contacts, recent travel or contact with confirmed COVID-19 infection.   Earlier in the day on 5/4, due to the patient's falls the patient's son called EMS who promptly brought the patient into [facility] emergency department for evaluation.  Upon evaluation in the emergency department noncontrast CT imaging of the brain parafalcine subdural hematoma measuring 10 mm in thickness.  Patient was also found to have evidence of a right upper lobe, right lower lobe and left upper lobe pneumonia.   Case was then discussed with neurosurgery who promptly came to evaluate the patient in consultation.  It was felt that no surgical intervention was needed however a medical admission was recommended with recommended repeat noncontrast CT the morning of 5/5 as well as treatment of concurrent pneumonia.  The hospitalist group was then called to assess the patient for admission to the hospital.  Review of Systems:   Review of Systems  Musculoskeletal: Positive for falls.  Neurological: Positive for weakness and headaches.    Past Medical History:  Diagnosis Date  . Abdominal pain   . Accelerating angina ([place]) 08/26/2019  . Acute blood loss anemia 12/14/2019  . Allergic rhinitis 06/21/2014  . Anxiety   . Arthritis   . Asthma   . Atherosclerosis of arteries 08/24/2019  . Bilateral edema of lower extremity   . Body mass index 29.0-29.9, adult 08/24/2019  . CAD (coronary artery disease) 10/25/2019  . Cellulitis of right leg   . Combined hyperlipidemia 08/24/2019  . Complication of anesthesia    difficulty waking  . Congestive heart failure (CHF) ([place])   . COPD (chronic obstructive pulmonary disease) ([place])   . Coronary artery disease   . Dyspnea 02/09/2019  . Dyspnea on exertion   . Embolic stroke ([place]) 2/72/5366  . Essential thrombocythemia ([place]) 06/22/2014  . Essential thrombocytosis ([place]) 06/22/2014  . Gastrointestinal bleeding 06/23/2018  . GERD (gastroesophageal reflux  disease)   . History of melanoma excision    left greast toe 2015  . History of squamous cell carcinoma excision    face--  multiple excisions  . History of subdural hematoma    2008  . HTN (hypertension) 06/21/2014  . Hypertension    . Ischial bursitis, right 08/24/2019  . Leucocytosis 12/14/2019  . Malignant melanoma of great toe ([place]) 08/24/2019  . Melanoma in situ ([place]) 06/29/2014  . OSA (obstructive sleep apnea)    intolerant  . Pedal edema 08/24/2019  . Renal insufficiency 08/24/2019  . S/P CABG x 4 11/04/2019  . Skin cancer 08/24/2019  . Solitary pulmonary nodule on lung CT 02/09/2019  . Squamous cell carcinoma, scalp/neck 08/24/2019  . Stroke due to embolism ([place]) 11/25/2019  . Swelling of limb 08/24/2019  . Thrombocytosis    takes hydoxyurea--  MONITORED BY DR Hinton [facility])  . VIN III (vulvar intraepithelial neoplasia III)   . Vitamin B 12 deficiency 08/24/2019  . Vitamin D deficiency 08/24/2019  . Wears dentures    UPPER AND LOWER PARTIAL  . Wears glasses     Past Surgical History:  Procedure Laterality Date  . ABDOMINAL HYSTERECTOMY  age 20  . CARDIAC CATHETERIZATION  03/172021  . CORONARY ARTERY BYPASS GRAFT N/A 11/04/2019   Procedure: CORONARY ARTERY BYPASS GRAFTING (CABG) x 4, with ENDOSCOPIC HARVESTING OF RIGHT GREATER SAPHENOUS VEIN.;  Surgeon: Gaye Pollack, MD;  Location: [place] OR;  Service: Open Heart Surgery;  Laterality: N/A;  . INTRAVASCULAR ULTRASOUND/IVUS N/A 11/03/2019   Procedure: Intravascular Ultrasound/IVUS;  Surgeon: Nelva Bush, MD;  Location: [place] CV LAB;  Service: Cardiovascular;  Laterality: N/A;  . KNEE ARTHROSCOPY Left 2004  . LEFT HEART CATH AND CORONARY ANGIOGRAPHY N/A 11/03/2019   Procedure: LEFT HEART CATH AND CORONARY ANGIOGRAPHY;  Surgeon: Nelva Bush, MD;  Location: [place] CV LAB;  Service: Cardiovascular;  Laterality: N/A;  . LOOP RECORDER INSERTION N/A 11/30/2019   Procedure: LOOP RECORDER INSERTION;  Surgeon: Thompson Grayer, MD;  Location: [place] [facility] LAB;  Service: Cardiovascular;  Laterality: N/A;  . MELANOMA EXCISION  2015   left great toe  . REPAIR PERONEAL TENDONS ANKLE  2004  . SUBDURAL HEMATOMA EVACUATION VIA CRANIOTOMY  2008      week  later  post-op  [facility]  . TEE WITHOUT CARDIOVERSION N/A 11/04/2019   Procedure: TRANSESOPHAGEAL ECHOCARDIOGRAM (TEE);  Surgeon: Gaye Pollack, MD;  Location: [place];  Service: Open Heart Surgery;  Laterality: N/A;  . VULVECTOMY N/A 10/24/2015   Procedure: WIDE LOCAL EXCISION OF THE VULVA ;  Surgeon: Everitt Amber, MD;  Location: [place];  Service: Gynecology;  Laterality: N/A;     reports that she has never smoked. She has never used smokeless tobacco. She reports that she does not drink alcohol and does not use drugs.  Allergies  Allergen Reactions  . Cephalexin Hives  . Penicillin G   . Ciprofloxacin Other (See Comments)    Gi intolerance  . Penicillins Rash    Childhood reaction Did it involve swelling of the face/tongue/throat, SOB, or low BP? No Did it involve sudden or severe rash/hives, skin peeling, or any reaction on the inside of your mouth or nose? No Did you need to seek medical attention at a hospital or doctor's office? No When did it last happen?50 years ago If all above answers are "NO", may proceed with cephalosporin use.     Family History  Problem  Relation Age of Onset  . Clotting disorder Mother   . Hypertension Mother   . Heart disease Mother   . Arthritis Mother   . Stroke Father   . Hypertension Father   . Heart disease Father   . Arthritis Father   . Emphysema Father   . Asthma Father   . Kidney failure Father   . Hypertension Sister   . Heart disease Sister   . Arthritis Sister   . Hypertension Brother   . Heart disease Brother   . Arthritis Brother      Prior to Admission medications   Medication Sig Start Date End Date Taking? Authorizing Provider  acetaminophen (TYLENOL) 325 MG tablet Take 1-2 tablets (325-650 mg total) by mouth every 4 (four) hours as needed for mild pain. 12/09/19  Yes Love, Ivan Anchors, PA-C  ammonium lactate (LAC-HYDRIN) 12 % lotion Apply 1 application topically as needed for dry skin.   Yes  [provider]  Ascorbic Acid (VITAMIN C) 1000 MG tablet Take 1,000 mg by mouth daily.   Yes [provider]  atorvastatin (LIPITOR) 80 MG tablet Take 80 mg by mouth daily. Takes it at 6pm daily   Yes [provider]  Calcium Carb-Cholecalciferol (CALCIUM 600 + D PO) Take 1 tablet by mouth in the morning and at bedtime.    Yes [provider]  clopidogrel (PLAVIX) 75 MG tablet Take 1 tablet (75 mg total) by mouth daily. 12/21/19  Yes Revankar, [name], MD  fluticasone (FLONASE) 50 MCG/ACT nasal spray Place 2 sprays into both nostrils at bedtime.  04/24/14  Yes [provider]  Magnesium 250 MG TABS Take 250 mg by mouth at bedtime.    Yes [provider]  metoprolol tartrate (LOPRESSOR) 25 MG tablet Take 1 tablet (25 mg total) by mouth 2 (two) times daily. 12/21/19  Yes Revankar, [name], MD  Multiple Vitamins-Minerals (PRESERVISION AREDS 2) CAPS Take 1 capsule by mouth in the morning and at bedtime.    Yes [provider]  pantoprazole (PROTONIX) 40 MG tablet Take 1 tablet (40 mg total) by mouth daily. 12/21/19  Yes Revankar, [name], MD  traMADol (ULTRAM) 50 MG tablet Take 1 tablet (50 mg total) by mouth every 6 (six) hours as needed for moderate pain. 12/09/19  Yes Love, Ivan Anchors, PA-C  Vitamin D, Ergocalciferol, (DRISDOL) 1.25 MG (50000 UT) CAPS capsule Take 50,000 Units by mouth every 14 (fourteen) days. 06/01/19  Yes [provider]    Physical Exam: Vitals:   12/20/20 2130 12/20/20 2215 12/20/20 2300 12/20/20 2315  BP: 118/72 126/65 112/60 109/66  Pulse: 66 67 73 72  Resp: (!) '26 19 20 ' (!) 23  Temp:      TempSrc:      SpO2: 94% 96% 90% 94%  Weight:      Height:        Constitutional: Awake alert and oriented x3, no associated distress.   Skin: no rashes, no lesions, good skin turgor noted. Eyes: Pupils are equally reactive to light.  No evidence of scleral icterus or conjunctival pallor.  ENMT: Moist mucous membranes  noted.  Posterior pharynx clear of any exudate or lesions.   Neck: normal, supple, no masses, no thyromegaly.  No evidence of jugular venous distension.   Respiratory: Scattered rhonchi bilaterally without any evidence of wheezing or rales. Normal respiratory effort. No accessory muscle use.  Cardiovascular: Regular rate and rhythm, no murmurs / rubs / gallops. No extremity edema.  2+ pedal pulses. No carotid bruits.  Chest:   Nontender without crepitus or deformity.   Back:   Nontender without crepitus or deformity. Abdomen: Abdomen is soft and nontender.  No evidence of intra-abdominal masses.  Positive bowel sounds noted in all quadrants.   Musculoskeletal: No joint deformity upper and lower extremities. Good ROM, no contractures. Normal muscle tone.  Neurologic: CN 2-12 grossly intact. Sensation intact.  Patient moving all 4 extremities spontaneously.  Patient is following all commands.  Patient is responsive to verbal stimuli.   Psychiatric: Patient exhibits normal mood with appropriate affect.  Patient seems to possess insight as to their current situation.     Labs on Admission: I have personally reviewed following labs and imaging studies -   CBC: Recent Labs  Lab 12/18/20 0000 12/20/20 1525 12/21/20 0236  WBC 24.2 28.2* 23.2*  NEUTROABS 19.36 25.7* PENDING  HGB 10.6* 10.3* 9.5*  HCT 33* 33.8* 31.5*  MCV 96 100.0 102.3*  PLT 153 172 694   Basic Metabolic Panel: Recent Labs  Lab 12/18/20 0000 12/20/20 1525 12/21/20 0236  NA 139 138 138  K 4.4 4.5 4.3  CL 104 104 106  CO2 28* 23 22  GLUCOSE  --  99 97  BUN '16 15 11  ' CREATININE 1.0 0.96 0.94  CALCIUM 8.8 9.0 8.5*   GFR: Estimated Creatinine Clearance: 50 mL/min (by C-G formula based on SCr of 0.94 mg/dL). Liver Function Tests: Recent Labs  Lab 12/18/20 0000 12/20/20 1525 12/21/20 0236  AST 26 17 13*  ALT '11 11 9  ' ALKPHOS 105 73 66  BILITOT  --  0.8 0.3  PROT  --  7.6 6.8  ALBUMIN 4.2 3.3* 3.0*   No results  for input(s): LIPASE, AMYLASE in the last 168 hours. No results for input(s): AMMONIA in the last 168 hours. Coagulation Profile: Recent Labs  Lab 12/20/20 1525 12/21/20 0236  INR 1.2 1.3*   Cardiac Enzymes: Recent Labs  Lab 12/20/20 1525  CKTOTAL 115   BNP (last 3 results) No results for input(s): PROBNP in the last 8760 hours. HbA1C: Recent Labs    12/21/20 0236  HGBA1C 5.8*   CBG: No results for input(s): GLUCAP in the last 168 hours. Lipid Profile: No results for input(s): CHOL, HDL, LDLCALC, TRIG, CHOLHDL, LDLDIRECT in the last 72 hours. Thyroid Function Tests: Recent Labs    12/20/20 1525  TSH 1.211   Anemia Panel: No results for input(s): VITAMINB12, FOLATE, FERRITIN, TIBC, IRON, RETICCTPCT in the last 72 hours. Urine analysis:    Component Value Date/Time   COLORURINE YELLOW 12/21/2020 0001   APPEARANCEUR CLEAR 12/21/2020 0001   LABSPEC 1.041 (H) 12/21/2020 0001   PHURINE 5.0 12/21/2020 0001   GLUCOSEU NEGATIVE 12/21/2020 0001   HGBUR NEGATIVE 12/21/2020 0001   BILIRUBINUR NEGATIVE 12/21/2020 0001   KETONESUR NEGATIVE 12/21/2020 0001   PROTEINUR NEGATIVE 12/21/2020 0001   NITRITE NEGATIVE 12/21/2020 0001   LEUKOCYTESUR TRACE (A) 12/21/2020 0001    Radiological Exams on Admission - Personally Reviewed: CT Angio Head W or Wo Contrast  Result Date: 12/20/2020 CLINICAL DATA:  Neuro deficit, acute, stroke suspected; history of intracerebral aneurysm, headache for several days, multiple falls today, pain in neck, rule out bleed or aneurysmal change. EXAM: CT ANGIOGRAPHY HEAD AND NECK TECHNIQUE: Multidetector CT imaging of the head and neck was performed using the standard protocol during bolus administration of intravenous contrast. Multiplanar CT image reconstructions and MIPs were obtained to evaluate the vascular anatomy. Carotid stenosis measurements (  when applicable) are obtained utilizing NASCET criteria, using the distal internal carotid diameter as the  denominator. CONTRAST:  71m OMNIPAQUE IOHEXOL 350 MG/ML SOLN COMPARISON:  Brain MRI 11/25/2019. CT angiogram head/neck 11/25/2019. FINDINGS: CT HEAD FINDINGS Brain: Mild cerebral and cerebellar atrophy. Acute parafalcine subdural hematoma measuring up to 10 mm in greatest thickness (series 5, image 11). Unchanged size of a thin chronic subdural hematoma overlying the left cerebral hemisphere, measuring up to 4 mm, deep to the left-sided cranioplasty. Subacute infarct within the left thalamus. Additional known small subacute infarcts within the supratentorial and infratentorial brain were better appreciated on the prior brain MRI of 11/25/2019. 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 Advanced patchy and ill-defined hypoattenuation within the cerebral white matter, nonspecific but compatible with chronic small vessel ischemic disease. No acute demarcated cortical infarct. No evidence of intracranial mass. No midline shift. Vascular: No hyperdense vessel.  Atherosclerotic calcifications Skull: No calvarial fracture.  Left-sided cranioplasty. Sinuses: Scattered trace mucosal thickening and fluid within the bilateral ethmoid air cells. Small right maxillary sinus mucous retention cyst. Orbits: No mass or acute finding. Review of the MIP images confirms the above findings CTA NECK FINDINGS Aortic arch: Standard aortic branching. Atherosclerotic plaque within the visualized aortic arch and proximal major branch vessels of the neck. No hemodynamically significant innominate or proximal subclavian artery stenosis. Right carotid system: CCA and ICA patent within the neck without significant stenosis (50% or greater). Mild calcified plaque within the carotid bifurcation and proximal ICA. Left carotid system: CCA and ICA patent within the neck without significant stenosis (50% or greater). Mild soft and calcified plaque within the carotid bifurcation and proximal ICA. Vertebral arteries: Codominant  patent within the neck without stenosis. Mild nonstenotic calcified plaque at the origin of the right vertebral artery. Skeleton: No acute bony abnormality or aggressive osseous lesion. Cervical spondylosis. Cervical levocurvature with partially imaged thoracic dextrocurvature. Other neck: Neck mass or cervical lymphadenopathy. Upper chest: Consolidation within portions of the right upper lobe and within imaged portions of the superior right lower lobe compatible with pneumonia. A small ill-defined opacity also present within the left lung apex likely reflecting an additional site of pneumonia. Prior median sternotomy. Review of the MIP images confirms the above findings CTA HEAD FINDINGS Anterior circulation: The intracranial internal carotid arteries are patent. Plaque within both vessels without stenosis. Unchanged 2 mm inferomedially projecting aneurysm arising from the supraclinoid left ICA (series 12, image 122). The M1 middle cerebral arteries are patent. No M2 proximal branch occlusion or high-grade proximal stenosis is identified. The anterior cerebral arteries are patent. Posterior circulation: The intracranial vertebral arteries are patent. The basilar artery is patent. The posterior cerebral arteries are patent. Posterior communicating arteries are hypoplastic or absent bilaterally. Venous sinuses: Within the limitations of contrast timing, no convincing thrombus. Anatomic variants: As described Review of the MIP images confirms the above findings Acute CT head findings called by telephone at the time of interpretation on 12/20/2020 at 7:55 pm [facility], who verbally acknowledged these results. IMPRESSION: CT head: 1. Acute parafalcine subdural hematoma measuring up to 10 mm in greatest thickness. 2. Unchanged thin chronic subdural hematoma overlying the left cerebral hemisphere, deep to the left-sided cranioplasty (4 mm in thickness). 3. Subacute infarct within the left thalamus.  Additional known small subacute infarcts within the supratentorial and infratentorial brain were better appreciated on the prior brain MRI of 11/25/2019. 4. 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 5. Stable parenchymal atrophy and  severe cerebral white matter chronic small vessel ischemic disease. 6. Mild paranasal sinus disease, as described. CTA neck: 1. The common carotid, internal carotid and vertebral arteries are patent within the neck without hemodynamically significant stenosis. Atherosclerotic disease, as described. 2. Findings compatible right upper lobe, right lower lobe and left upper lobe pneumonia. Right lower lobe pneumonia is incompletely imaged. CTA head: 1. No intracranial large vessel occlusion or proximal high-grade arterial stenosis. 2. 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 Electronically Signed   By: Kellie Simmering DO   On: 12/20/2020 19:57   DG Chest 2 View  Result Date: 12/20/2020 CLINICAL DATA:  Dyspnea, fatigue, congestive heart failure EXAM: CHEST - 2 VIEW COMPARISON:  12/22/2019 FINDINGS: There is focal consolidation within the posterior right upper lobe, likely infectious in the acute setting. The chin overlies the lung apices, however, no definite pneumothorax. No pleural effusion. Cardiac size is mildly enlarged. Coronary artery bypass grafting has been performed. The pulmonary vascularity is normal. No acute bone abnormality. IMPRESSION: Right upper lobe consolidation in keeping with acute lobar pneumonia in the appropriate clinical setting. Follow-up chest radiograph is recommended in 3-4 weeks to document resolution and exclude the presence of a central obstructing lesion. Electronically Signed   By: Fidela Salisbury MD   On: 12/20/2020 16:32   CT Angio Neck W and/or Wo Contrast  Result Date: 12/20/2020 CLINICAL DATA:  Neuro deficit, acute, stroke suspected; history of intracerebral aneurysm, headache  for several days, multiple falls today, pain in neck, rule out bleed or aneurysmal change. EXAM: CT ANGIOGRAPHY HEAD AND NECK TECHNIQUE: Multidetector CT imaging of the head and neck was performed using the standard protocol during bolus administration of intravenous contrast. Multiplanar CT image reconstructions and MIPs were obtained to evaluate the vascular anatomy. Carotid stenosis measurements (when applicable) are obtained utilizing NASCET criteria, using the distal internal carotid diameter as the denominator. CONTRAST:  28m OMNIPAQUE IOHEXOL 350 MG/ML SOLN COMPARISON:  Brain MRI 11/25/2019. CT angiogram head/neck 11/25/2019. FINDINGS: CT HEAD FINDINGS Brain: Mild cerebral and cerebellar atrophy. Acute parafalcine subdural hematoma measuring up to 10 mm in greatest thickness (series 5, image 11). Unchanged size of a thin chronic subdural hematoma overlying the left cerebral hemisphere, measuring up to 4 mm, deep to the left-sided cranioplasty. Subacute infarct within the left thalamus. Additional known small subacute infarcts within the supratentorial and infratentorial brain were better appreciated on the prior brain MRI of 11/25/2019. 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 Advanced patchy and ill-defined hypoattenuation within the cerebral white matter, nonspecific but compatible with chronic small vessel ischemic disease. No acute demarcated cortical infarct. No evidence of intracranial mass. No midline shift. Vascular: No hyperdense vessel.  Atherosclerotic calcifications Skull: No calvarial fracture.  Left-sided cranioplasty. Sinuses: Scattered trace mucosal thickening and fluid within the bilateral ethmoid air cells. Small right maxillary sinus mucous retention cyst. Orbits: No mass or acute finding. Review of the MIP images confirms the above findings CTA NECK FINDINGS Aortic arch: Standard aortic branching. Atherosclerotic plaque within the visualized aortic arch and  proximal major branch vessels of the neck. No hemodynamically significant innominate or proximal subclavian artery stenosis. Right carotid system: CCA and ICA patent within the neck without significant stenosis (50% or greater). Mild calcified plaque within the carotid bifurcation and proximal ICA. Left carotid system: CCA and ICA patent within the neck without significant stenosis (50% or greater). Mild soft and calcified plaque within the carotid bifurcation and proximal  ICA. Vertebral arteries: Codominant patent within the neck without stenosis. Mild nonstenotic calcified plaque at the origin of the right vertebral artery. Skeleton: No acute bony abnormality or aggressive osseous lesion. Cervical spondylosis. Cervical levocurvature with partially imaged thoracic dextrocurvature. Other neck: Neck mass or cervical lymphadenopathy. Upper chest: Consolidation within portions of the right upper lobe and within imaged portions of the superior right lower lobe compatible with pneumonia. A small ill-defined opacity also present within the left lung apex likely reflecting an additional site of pneumonia. Prior median sternotomy. Review of the MIP images confirms the above findings CTA HEAD FINDINGS Anterior circulation: The intracranial internal carotid arteries are patent. Plaque within both vessels without stenosis. Unchanged 2 mm inferomedially projecting aneurysm arising from the supraclinoid left ICA (series 12, image 122). The M1 middle cerebral arteries are patent. No M2 proximal branch occlusion or high-grade proximal stenosis is identified. The anterior cerebral arteries are patent. Posterior circulation: The intracranial vertebral arteries are patent. The basilar artery is patent. The posterior cerebral arteries are patent. Posterior communicating arteries are hypoplastic or absent bilaterally. Venous sinuses: Within the limitations of contrast timing, no convincing thrombus. Anatomic variants: As described  Review of the MIP images confirms the above findings Acute CT head findings called by telephone at the time of interpretation on 12/20/2020 at 7:55 pm to provider [facility] , who verbally acknowledged these results. IMPRESSION: CT head: 1. Acute parafalcine subdural hematoma measuring up to 10 mm in greatest thickness. 2. Unchanged thin chronic subdural hematoma overlying the left cerebral hemisphere, deep to the left-sided cranioplasty (4 mm in thickness). 3. Subacute infarct within the left thalamus. Additional known small subacute infarcts within the supratentorial and infratentorial brain were better appreciated on the prior brain MRI of 11/25/2019. 4. 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 5. Stable parenchymal atrophy and severe cerebral white matter chronic small vessel ischemic disease. 6. Mild paranasal sinus disease, as described. CTA neck: 1. The common carotid, internal carotid and vertebral arteries are patent within the neck without hemodynamically significant stenosis. Atherosclerotic disease, as described. 2. Findings compatible right upper lobe, right lower lobe and left upper lobe pneumonia. Right lower lobe pneumonia is incompletely imaged. CTA head: 1. No intracranial large vessel occlusion or proximal high-grade arterial stenosis. 2. 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 Electronically Signed   By: Kellie Simmering DO   On: 12/20/2020 19:57   MR BRAIN WO CONTRAST  Result Date: 12/21/2020 CLINICAL DATA:  Stroke symptoms and parafalcine subdural hematoma EXAM: MRI HEAD WITHOUT CONTRAST TECHNIQUE: Multiplanar, multiecho pulse sequences of the brain and surrounding structures were obtained without intravenous contrast. COMPARISON:  Head CT 12/20/2020 FINDINGS: Brain: There is multifocal acute ischemia throughout both cerebral and cerebellar hemispheres. The pattern is most suggestive watershed infarcts. The  subdural hematoma along the falx cerebri is unchanged. Hyperintense T2-weighted signal is moderately widespread throughout the white matter. Generalized volume loss without a clear lobar predilection. The midline structures are normal. Vascular: Major flow voids are preserved. Skull and upper cervical spine: Remote left pterional craniotomy. Sinuses/Orbits:No paranasal sinus fluid levels or advanced mucosal thickening. No mastoid or middle ear effusion. Normal orbits. IMPRESSION: 1. Multifocal acute ischemia throughout both cerebral and cerebellar hemispheres, in a pattern most suggestive of watershed infarcts. 2. Unchanged subdural hematoma along the falx cerebri. Electronically Signed   By: Ulyses Jarred M.D.   On: 12/21/2020 00:55   DG Knee Complete 4 Views Left  Result  Date: 12/20/2020 CLINICAL DATA:  Pain following fall EXAM: LEFT KNEE - COMPLETE 4+ VIEW COMPARISON:  None. FINDINGS: Frontal, lateral, and bilateral oblique views were obtained. There is no fracture or dislocation. No joint effusion. There is moderate narrowing medially and in the patellofemoral joint region. There is spurring in all compartments. No erosive changes. There are foci of arterial vascular calcification. 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 No fracture, dislocation, or effusion. Atherosclerotic arterial vascular calcifications noted. Electronically Signed   By: Lowella Grip III M.D.   On: 12/20/2020 16:28   DG Knee Complete 4 Views Right  Result Date: 12/20/2020 CLINICAL DATA:  Pain following fall EXAM: RIGHT KNEE - COMPLETE 4+ VIEW COMPARISON:  None. FINDINGS: Frontal, lateral, and bilateral oblique views were obtained. No appreciable fracture or dislocation. No joint effusion. There is moderate narrowing of the patellofemoral joint. There is mild narrowing medially. There is spurring in all compartments. No erosive change. There are multiple foci of arterial vascular  calcification. Surgical clips are noted medial to the proximal tibia. IMPRESSION: Osteoarthritic change early, primarily in the patellofemoral joint and medial compartments. No fracture, dislocation, or effusion. Foci of atherosclerotic arterial vascular calcification noted. Electronically Signed   By: Lowella Grip III M.D.   On: 12/20/2020 16:29    EKG: Personally reviewed.  Rhythm is normal sinus rhythm with heart rate of 69 bpm.  No dynamic ST segment changes appreciated.  Assessment/Plan Principal Problem:   Subdural hematoma [facility])   Patient presenting with increasingly frequent falls over approximately the past 2 weeks  CT imaging of the brain revealing on arrival that patient is suffering from an acute parafalcine subdural hematoma.  This is superimposed on patient's longstanding history of a chronic small left subdural hematoma since 2008.  I suspect that the patient's frequent falls are the culprit and the cause of the frequent falls is likely the patient's numerous bilateral ischemic strokes, presumably cardioembolic.  Patient is already been evaluated by neurosurgery and their input is appreciated.    Neurosurgery recommends no surgical intervention at this time but recommend repeat CT imaging the morning of 5/5.  Holding any anticoagulants at this time  Serial neurologic checks  Active Problems:   Acute cardioembolic stroke [facility])   Unfortunately the patient is concurrently experiencing numerous bilateral infarcts on MRI imaging.  While radiology comments that the infarcts are thought to be watershed infarcts, I have discussed and reviewed the images with Dr. Malen Gauze with neurology and he feels that these are most likely cardioembolic.  It is worth noting that patient was additionally hospitalized for multiple strokes in 11/2019.  At the time it was also felt that the strokes were cardioembolic but the source was never identified.  Patient was treated with dual  antiplatelet therapy for 3 weeks followed by antiplatelet monotherapy.  CT angiogram of the head and neck have already been performed  Echocardiogram ordered for the morning -will likely require TEE as well.  Monitoring patient on telemetry  Serial neurologic checks  PT, OT, SLP evaluation in the morning  Permissive hypertension  Avoiding all anticoagulants for now  I have discussed the case with Dr. Malen Gauze with neurology who will formally see the patient in consultation    Pneumonia of both lungs due to infectious organism   Patient has evidence of developing infiltrates in the right upper right lower and left upper lobes, particularly notable on CT angiogram of the head and neck  While patient has substantial leukocytosis this  is unreliable as patient has had chronic leukocytosis for at least the past 2 years.  Blood cultures have been obtained  Procalcitonin and CRP pending  Treating patient with intravenous ceftriaxone (patient has reported cephalosporin allergy but is tolerating Ceftriaxone here) and azithromycin  We will provide patient with supplemental oxygen as necessary    Fall at home, initial encounter   Frequent falls or presumably secondary to unsteady gait from numerous strokes the patient has developed.  Please see remainder of assessment and plan under stroke as above.    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  Patient is to undergo bone marrow biopsy in the next several weeks per their notes  Presence of substantial chronic leukocytosis makes assessment of infectious processes difficult  Close outpatient follow-up    Coronary artery disease involving native heart without angina pectoris   Currently chest pain-free  Continue home regimen of statin therapy and beta-blocker -permissive hypertension otherwise  Holding any anticoagulation at this point due to presence of subdural hematoma  Monitoring patient on telemetry    GERD without esophagitis   Continue home regimen of Protonix   Code Status:  DNR Family Communication: deferred   Status is: Observation  The patient remains OBS appropriate and will d/c before 2 midnights.  Dispo: The patient is from: Home              Anticipated d/c is to: Home              Patient currently is not medically stable to d/c.   Difficult to place patient No        Vernelle Emerald MD [facility] Pager [phone]  If 7PM-7AM, please contact night-coverage [email or web address] Use universal [place] password for that web site. If you do not have the password, please call the hospital operator.  12/21/2020, 3:40 AM

## 2020-12-21 NOTE — Progress Notes (Signed)
STROKE TEAM PROGRESS NOTE   INTERVAL HISTORY Daughter is at the bedside. Pt lying in bed, awake alert orientated. No focal neuro deficit. She denies any weight loss or abdominal pain. Discussed with pt and daughter about embolic strokes but she is not candidate for blood thinner given SDH. Will check CT C/A/P to rule out malignancy  Vitals:   12/20/20 2215 12/20/20 2300 12/20/20 2315 12/21/20 0707  BP: 126/65 112/60 109/66 125/73  Pulse: 67 73 72 71  Resp: 19 20 (!) 23 17  Temp:      TempSrc:      SpO2: 96% 90% 94% 93%  Weight:      Height:       CBC:  Recent Labs  Lab 12/20/20 1525 12/21/20 0236  WBC 28.2* 23.2*  NEUTROABS 25.7* 20.4*  HGB 10.3* 9.5*  HCT 33.8* 31.5*  MCV 100.0 102.3*  PLT 172 024   Basic Metabolic Panel:  Recent Labs  Lab 12/20/20 1525 12/21/20 0236  NA 138 138  K 4.5 4.3  CL 104 106  CO2 23 22  GLUCOSE 99 97  BUN 15 11  CREATININE 0.96 0.94  CALCIUM 9.0 8.5*   Lipid Panel:  Recent Labs  Lab 12/21/20 0236  CHOL 74  TRIG 104  HDL 28*  CHOLHDL 2.6  VLDL 21  LDLCALC 25   HgbA1c:  Recent Labs  Lab 12/21/20 0236  HGBA1C 5.8*   Urine Drug Screen: No results for input(s): LABOPIA, COCAINSCRNUR, LABBENZ, AMPHETMU, THCU, LABBARB in the last 168 hours.  Alcohol Level No results for input(s): ETH in the last 168 hours.  IMAGING past 24 hours CT Angio Head W or Wo Contrast  Result Date: 12/20/2020 CLINICAL DATA:  Neuro deficit, acute, stroke suspected; history of intracerebral aneurysm, headache for several days, multiple falls today, pain in neck, rule out bleed or aneurysmal change. EXAM: CT ANGIOGRAPHY HEAD AND NECK TECHNIQUE: Multidetector CT imaging of the head and neck was performed using the standard protocol during bolus administration of intravenous contrast. Multiplanar CT image reconstructions and MIPs were obtained to evaluate the vascular anatomy. Carotid stenosis measurements (when applicable) are obtained utilizing NASCET criteria,  using the distal internal carotid diameter as the denominator. CONTRAST:  37m OMNIPAQUE IOHEXOL 350 MG/ML SOLN COMPARISON:  Brain MRI 11/25/2019. CT angiogram head/neck 11/25/2019. FINDINGS: CT HEAD FINDINGS Brain: Mild cerebral and cerebellar atrophy. Acute parafalcine subdural hematoma measuring up to 10 mm in greatest thickness (series 5, image 11). Unchanged size of a thin chronic subdural hematoma overlying the left cerebral hemisphere, measuring up to 4 mm, deep to the left-sided cranioplasty. Subacute infarct within the left thalamus. Additional known small subacute infarcts within the supratentorial and infratentorial brain were better appreciated on the prior brain MRI of 11/25/2019. 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 Advanced patchy and ill-defined hypoattenuation within the cerebral white matter, nonspecific but compatible with chronic small vessel ischemic disease. No acute demarcated cortical infarct. No evidence of intracranial mass. No midline shift. Vascular: No hyperdense vessel.  Atherosclerotic calcifications Skull: No calvarial fracture.  Left-sided cranioplasty. Sinuses: Scattered trace mucosal thickening and fluid within the bilateral ethmoid air cells. Small right maxillary sinus mucous retention cyst. Orbits: No mass or acute finding. Review of the MIP images confirms the above findings CTA NECK FINDINGS Aortic arch: Standard aortic branching. Atherosclerotic plaque within the visualized aortic arch and proximal major branch vessels of the neck. No hemodynamically significant innominate or proximal subclavian artery stenosis. Right carotid system: CCA and ICA  patent within the neck without significant stenosis (50% or greater). Mild calcified plaque within the carotid bifurcation and proximal ICA. Left carotid system: CCA and ICA patent within the neck without significant stenosis (50% or greater). Mild soft and calcified plaque within the carotid bifurcation  and proximal ICA. Vertebral arteries: Codominant patent within the neck without stenosis. Mild nonstenotic calcified plaque at the origin of the right vertebral artery. Skeleton: No acute bony abnormality or aggressive osseous lesion. Cervical spondylosis. Cervical levocurvature with partially imaged thoracic dextrocurvature. Other neck: Neck mass or cervical lymphadenopathy. Upper chest: Consolidation within portions of the right upper lobe and within imaged portions of the superior right lower lobe compatible with pneumonia. A small ill-defined opacity also present within the left lung apex likely reflecting an additional site of pneumonia. Prior median sternotomy. Review of the MIP images confirms the above findings CTA HEAD FINDINGS Anterior circulation: The intracranial internal carotid arteries are patent. Plaque within both vessels without stenosis. Unchanged 2 mm inferomedially projecting aneurysm arising from the supraclinoid left ICA (series 12, image 122). The M1 middle cerebral arteries are patent. No M2 proximal branch occlusion or high-grade proximal stenosis is identified. The anterior cerebral arteries are patent. Posterior circulation: The intracranial vertebral arteries are patent. The basilar artery is patent. The posterior cerebral arteries are patent. Posterior communicating arteries are hypoplastic or absent bilaterally. Venous sinuses: Within the limitations of contrast timing, no convincing thrombus. Anatomic variants: As described Review of the MIP images confirms the above findings Acute CT head findings called by telephone at the time of interpretation on 12/20/2020 at 7:55 pm to provider [facility] , who verbally acknowledged these results. IMPRESSION: CT head: 1. Acute parafalcine subdural hematoma measuring up to 10 mm in greatest thickness. 2. Unchanged thin chronic subdural hematoma overlying the left cerebral hemisphere, deep to the left-sided cranioplasty (4 mm in thickness).  3. Subacute infarct within the left thalamus. Additional known small subacute infarcts within the supratentorial and infratentorial brain were better appreciated on the prior brain MRI of 11/25/2019. 4. 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 5. Stable parenchymal atrophy and severe cerebral white matter chronic small vessel ischemic disease. 6. Mild paranasal sinus disease, as described. CTA neck: 1. The common carotid, internal carotid and vertebral arteries are patent within the neck without hemodynamically significant stenosis. Atherosclerotic disease, as described. 2. Findings compatible right upper lobe, right lower lobe and left upper lobe pneumonia. Right lower lobe pneumonia is incompletely imaged. CTA head: 1. No intracranial large vessel occlusion or proximal high-grade arterial stenosis. 2. 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 Electronically Signed   By: Kellie Simmering DO   On: 12/20/2020 19:57   DG Chest 2 View  Result Date: 12/20/2020 CLINICAL DATA:  Dyspnea, fatigue, congestive heart failure EXAM: CHEST - 2 VIEW COMPARISON:  12/22/2019 FINDINGS: There is focal consolidation within the posterior right upper lobe, likely infectious in the acute setting. The chin overlies the lung apices, however, no definite pneumothorax. No pleural effusion. Cardiac size is mildly enlarged. Coronary artery bypass grafting has been performed. The pulmonary vascularity is normal. No acute bone abnormality. IMPRESSION: Right upper lobe consolidation in keeping with acute lobar pneumonia in the appropriate clinical setting. Follow-up chest radiograph is recommended in 3-4 weeks to document resolution and exclude the presence of a central obstructing lesion. Electronically Signed   By: Fidela Salisbury MD   On: 12/20/2020 16:32   CT HEAD WO CONTRAST  Result Date: 12/21/2020 CLINICAL DATA:  Subdural hemorrhage.  Stroke follow-up EXAM: CT HEAD  WITHOUT CONTRAST TECHNIQUE: Contiguous axial images were obtained from the base of the skull through the vertex without intravenous contrast. COMPARISON:  Brain MRI from earlier today FINDINGS: Brain: High-density subdural hematoma along the falx measuring up to 8 mm in thickness, non progressed. No associated significant mass effect. Extensive acute and chronic ischemia as characterized by MRI earlier the same day. No hydrocephalus or shift. Vascular: Negative Skull: Unremarkable remote left craniotomy. Sinuses/Orbits: Bilateral cataract resection IMPRESSION: 1. No interval progression of the inter hemispheric subdural hematoma which measures up to 8 mm in thickness. 2. Acute and chronic ischemia, reference preceding brain MRI. Electronically Signed   By: Monte Fantasia M.D.   On: 12/21/2020 05:01   CT Angio Neck W and/or Wo Contrast  Result Date: 12/20/2020 CLINICAL DATA:  Neuro deficit, acute, stroke suspected; history of intracerebral aneurysm, headache for several days, multiple falls today, pain in neck, rule out bleed or aneurysmal change. EXAM: CT ANGIOGRAPHY HEAD AND NECK TECHNIQUE: Multidetector CT imaging of the head and neck was performed using the standard protocol during bolus administration of intravenous contrast. Multiplanar CT image reconstructions and MIPs were obtained to evaluate the vascular anatomy. Carotid stenosis measurements (when applicable) are obtained utilizing NASCET criteria, using the distal internal carotid diameter as the denominator. CONTRAST:  56mL OMNIPAQUE IOHEXOL 350 MG/ML SOLN COMPARISON:  Brain MRI 11/25/2019. CT angiogram head/neck 11/25/2019. FINDINGS: CT HEAD FINDINGS Brain: Mild cerebral and cerebellar atrophy. Acute parafalcine subdural hematoma measuring up to 10 mm in greatest thickness (series 5, image 11). Unchanged size of a thin chronic subdural hematoma overlying the left cerebral hemisphere, measuring up to 4 mm, deep to the left-sided cranioplasty.  Subacute infarct within the left thalamus. Additional known small subacute infarcts within the supratentorial and infratentorial brain were better appreciated on the prior brain MRI of 11/25/2019. 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 Advanced patchy and ill-defined hypoattenuation within the cerebral white matter, nonspecific but compatible with chronic small vessel ischemic disease. No acute demarcated cortical infarct. No evidence of intracranial mass. No midline shift. Vascular: No hyperdense vessel.  Atherosclerotic calcifications Skull: No calvarial fracture.  Left-sided cranioplasty. Sinuses: Scattered trace mucosal thickening and fluid within the bilateral ethmoid air cells. Small right maxillary sinus mucous retention cyst. Orbits: No mass or acute finding. Review of the MIP images confirms the above findings CTA NECK FINDINGS Aortic arch: Standard aortic branching. Atherosclerotic plaque within the visualized aortic arch and proximal major branch vessels of the neck. No hemodynamically significant innominate or proximal subclavian artery stenosis. Right carotid system: CCA and ICA patent within the neck without significant stenosis (50% or greater). Mild calcified plaque within the carotid bifurcation and proximal ICA. Left carotid system: CCA and ICA patent within the neck without significant stenosis (50% or greater). Mild soft and calcified plaque within the carotid bifurcation and proximal ICA. Vertebral arteries: Codominant patent within the neck without stenosis. Mild nonstenotic calcified plaque at the origin of the right vertebral artery. Skeleton: No acute bony abnormality or aggressive osseous lesion. Cervical spondylosis. Cervical levocurvature with partially imaged thoracic dextrocurvature. Other neck: Neck mass or cervical lymphadenopathy. Upper chest: Consolidation within portions of the right upper lobe and within imaged portions of the superior right lower lobe  compatible with pneumonia. A small ill-defined opacity also present within the left lung apex likely reflecting an additional site of pneumonia. Prior median sternotomy. Review of the MIP images confirms  the above findings CTA HEAD FINDINGS Anterior circulation: The intracranial internal carotid arteries are patent. Plaque within both vessels without stenosis. Unchanged 2 mm inferomedially projecting aneurysm arising from the supraclinoid left ICA (series 12, image 122). The M1 middle cerebral arteries are patent. No M2 proximal branch occlusion or high-grade proximal stenosis is identified. The anterior cerebral arteries are patent. Posterior circulation: The intracranial vertebral arteries are patent. The basilar artery is patent. The posterior cerebral arteries are patent. Posterior communicating arteries are hypoplastic or absent bilaterally. Venous sinuses: Within the limitations of contrast timing, no convincing thrombus. Anatomic variants: As described Review of the MIP images confirms the above findings Acute CT head findings called by telephone at the time of interpretation on 12/20/2020 at 7:55 pm to provider [facility] , who verbally acknowledged these results. IMPRESSION: CT head: 1. Acute parafalcine subdural hematoma measuring up to 10 mm in greatest thickness. 2. Unchanged thin chronic subdural hematoma overlying the left cerebral hemisphere, deep to the left-sided cranioplasty (4 mm in thickness). 3. Subacute infarct within the left thalamus. Additional known small subacute infarcts within the supratentorial and infratentorial brain were better appreciated on the prior brain MRI of 11/25/2019. 4. 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 5. Stable parenchymal atrophy and severe cerebral white matter chronic small vessel ischemic disease. 6. Mild paranasal sinus disease, as described. CTA neck: 1. The common carotid, internal carotid and vertebral arteries are patent  within the neck without hemodynamically significant stenosis. Atherosclerotic disease, as described. 2. Findings compatible right upper lobe, right lower lobe and left upper lobe pneumonia. Right lower lobe pneumonia is incompletely imaged. CTA head: 1. No intracranial large vessel occlusion or proximal high-grade arterial stenosis. 2. 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 Electronically Signed   By: Kellie Simmering DO   On: 12/20/2020 19:57   MR BRAIN WO CONTRAST  Result Date: 12/21/2020 CLINICAL DATA:  Stroke symptoms and parafalcine subdural hematoma EXAM: MRI HEAD WITHOUT CONTRAST TECHNIQUE: Multiplanar, multiecho pulse sequences of the brain and surrounding structures were obtained without intravenous contrast. COMPARISON:  Head CT 12/20/2020 FINDINGS: Brain: There is multifocal acute ischemia throughout both cerebral and cerebellar hemispheres. The pattern is most suggestive watershed infarcts. The subdural hematoma along the falx cerebri is unchanged. Hyperintense T2-weighted signal is moderately widespread throughout the white matter. Generalized volume loss without a clear lobar predilection. The midline structures are normal. Vascular: Major flow voids are preserved. Skull and upper cervical spine: Remote left pterional craniotomy. Sinuses/Orbits:No paranasal sinus fluid levels or advanced mucosal thickening. No mastoid or middle ear effusion. Normal orbits. IMPRESSION: 1. Multifocal acute ischemia throughout both cerebral and cerebellar hemispheres, in a pattern most suggestive of watershed infarcts. 2. Unchanged subdural hematoma along the falx cerebri. Electronically Signed   By: Ulyses Jarred M.D.   On: 12/21/2020 00:55   DG Knee Complete 4 Views Left  Result Date: 12/20/2020 CLINICAL DATA:  Pain following fall EXAM: LEFT KNEE - COMPLETE 4+ VIEW COMPARISON:  None. FINDINGS: Frontal, lateral, and bilateral oblique views were obtained. There is no  fracture or dislocation. No joint effusion. There is moderate narrowing medially and in the patellofemoral joint region. There is spurring in all compartments. No erosive changes. There are foci of arterial vascular calcification. 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 No fracture, dislocation, or effusion. Atherosclerotic arterial vascular calcifications noted. Electronically Signed   By: Lowella Grip III M.D.   On: 12/20/2020  16:28   DG Knee Complete 4 Views Right  Result Date: 12/20/2020 CLINICAL DATA:  Pain following fall EXAM: RIGHT KNEE - COMPLETE 4+ VIEW COMPARISON:  None. FINDINGS: Frontal, lateral, and bilateral oblique views were obtained. No appreciable fracture or dislocation. No joint effusion. There is moderate narrowing of the patellofemoral joint. There is mild narrowing medially. There is spurring in all compartments. No erosive change. There are multiple foci of arterial vascular calcification. Surgical clips are noted medial to the proximal tibia. IMPRESSION: Osteoarthritic change early, primarily in the patellofemoral joint and medial compartments. No fracture, dislocation, or effusion. Foci of atherosclerotic arterial vascular calcification noted. Electronically Signed   By: Lowella Grip III M.D.   On: 12/20/2020 16:29   ECHOCARDIOGRAM COMPLETE BUBBLE STUDY  Result Date: 12/21/2020    ECHOCARDIOGRAM REPORT   Patient Name:   LORELEY SCHWALL Date of Exam: 12/21/2020 Medical Rec #:  [id number]      Height:       66.0 in Accession #:    [id number]     Weight:       170.0 lb Date of Birth:  08-Jan-1941     BSA:          1.866 m Patient Age:    4 years       BP:           125/73 mmHg Patient Gender: F              HR:           71 bpm. Exam Location:  Inpatient Procedure: 2D Echo, Cardiac Doppler, Color Doppler and Saline Contrast Bubble            Study Indications:    CVA  History:        Patient has prior history of Echocardiogram  examinations, most                 recent 11/25/2019. CAD, Stroke and COPD, Signs/Symptoms:Shortness                 of Breath; Risk Factors:Hypertension and Dyslipidemia.  Sonographer:    [place] Referring Phys: [id number] [place]  1. Agitated saline contrast bubble study was negative, with no evidence of any interatrial shunt.  2. Left ventricular ejection fraction, by estimation, is 65 to 70%. The left ventricle has normal function. The left ventricle has no regional wall motion abnormalities. Left ventricular diastolic parameters are consistent with Grade I diastolic dysfunction (impaired relaxation).  3. Right ventricular systolic function is normal. The right ventricular size is normal. There is normal pulmonary artery systolic pressure. The estimated right ventricular systolic pressure is 91.6 mmHg.  4. Left atrial size was mildly dilated.  5. The mitral valve is abnormal. Trivial mitral valve regurgitation. The mean mitral valve gradient is 3.0 mmHg with average heart rate of 72 bpm. Moderate mitral annular calcification. Calcified papillary muscle noted, not as well seen in prior study.  6. The aortic valve is tricuspid. Aortic valve regurgitation is not visualized. Aortic valve sclerosis is present, with no evidence of aortic valve stenosis. Comparison(s): A prior study was performed on 11/25/19. No significant change from prior study. Prior images reviewed side by side. FINDINGS  Left Ventricle: Left ventricular ejection fraction, by estimation, is 65 to 70%. The left ventricle has normal function. The left ventricle has no regional wall motion abnormalities. The left ventricular internal cavity size was normal in size. There is  no left  ventricular hypertrophy of the basal-septal segment. Left ventricular diastolic parameters are consistent with Grade I diastolic dysfunction (impaired relaxation). Right Ventricle: The right ventricular size is normal. No increase in right  ventricular wall thickness. Right ventricular systolic function is normal. There is normal pulmonary artery systolic pressure. The tricuspid regurgitant velocity is 2.84 m/s, and  with an assumed right atrial pressure of 3 mmHg, the estimated right ventricular systolic pressure is 11.0 mmHg. Left Atrium: Left atrial size was mildly dilated. Right Atrium: Right atrial size was normal in size. Pericardium: Trivial pericardial effusion is present. Mitral Valve: Calcified papillary muscle noted. The mitral valve is abnormal. There is moderate calcification of the mitral valve leaflet(s). Moderate mitral annular calcification. Trivial mitral valve regurgitation. MV peak gradient, 7.8 mmHg. The mean mitral valve gradient is 3.0 mmHg with average heart rate of 72 bpm. Tricuspid Valve: The tricuspid valve is grossly normal. Tricuspid valve regurgitation is mild. Aortic Valve: The aortic valve is tricuspid. Aortic valve regurgitation is not visualized. Mild aortic valve sclerosis is present, with no evidence of aortic valve stenosis. Aortic valve mean gradient measures 7.0 mmHg. Aortic valve peak gradient measures 14.2 mmHg. Aortic valve area, by VTI measures 2.04 cm. Pulmonic Valve: The pulmonic valve was grossly normal. Pulmonic valve regurgitation is not visualized. No evidence of pulmonic stenosis. Aorta: The aortic root and ascending aorta are structurally normal, with no evidence of dilitation. IAS/Shunts: The atrial septum is grossly normal. Agitated saline contrast was given intravenously to evaluate for intracardiac shunting. Agitated saline contrast bubble study was negative, with no evidence of any interatrial shunt.  LEFT VENTRICLE PLAX 2D LVIDd:         5.00 cm     Diastology LVIDs:         1.70 cm     LV e' medial:    4.79 cm/s LV PW:         0.80 cm     LV E/e' medial:  15.3 LV IVS:        0.90 cm     LV e' lateral:   7.94 cm/s LVOT diam:     1.80 cm     LV E/e' lateral: 9.2 LV SV:         72 LV SV Index:    38 LVOT Area:     2.54 cm  LV Volumes (MOD) LV vol d, MOD A2C: 29.3 ml LV vol d, MOD A4C: 36.6 ml LV vol s, MOD A2C: 8.6 ml LV vol s, MOD A4C: 15.8 ml LV SV MOD A2C:     20.7 ml LV SV MOD A4C:     36.6 ml LV SV MOD BP:      20.5 ml RIGHT VENTRICLE RV S prime:     16.00 cm/s  PULMONARY VEINS TAPSE (M-mode): 1.8 cm      A Reversal Duration: 116.00 msec                             A Reversal Velocity: 33.20 cm/s                             Diastolic Velocity:  31.59 cm/s                             S/D Velocity:        1.50  Systolic Velocity:   75.91 cm/s LEFT ATRIUM             Index       RIGHT ATRIUM           Index LA diam:        3.70 cm 1.98 cm/m  RA Area:     16.10 cm LA Vol (A2C):   77.2 ml 41.36 ml/m RA Volume:   38.80 ml  20.79 ml/m LA Vol (A4C):   48.1 ml 25.77 ml/m LA Biplane Vol: 65.3 ml 34.99 ml/m  AORTIC VALVE                    PULMONIC VALVE AV Area (Vmax):    2.24 cm     PV Vmax:       0.88 m/s AV Area (Vmean):   2.19 cm     PV Vmean:      64.500 cm/s AV Area (VTI):     2.04 cm     PV VTI:        0.179 m AV Vmax:           188.50 cm/s  PV Peak grad:  3.1 mmHg AV Vmean:          119.500 cm/s PV Mean grad:  2.0 mmHg AV VTI:            0.351 m AV Peak Grad:      14.2 mmHg AV Mean Grad:      7.0 mmHg LVOT Vmax:         166.00 cm/s LVOT Vmean:        103.000 cm/s LVOT VTI:          0.281 m LVOT/AV VTI ratio: 0.80  AORTA Ao Root diam: 3.50 cm Ao Asc diam:  3.20 cm MITRAL VALVE                TRICUSPID VALVE MV Area (PHT): 1.62 cm     TR Peak grad:   32.3 mmHg MV Area VTI:   2.03 cm     TR Vmax:        284.00 cm/s MV Peak grad:  7.8 mmHg MV Mean grad:  3.0 mmHg     SHUNTS MV Vmax:       1.40 m/s     Systemic VTI:  0.28 m MV Vmean:      73.7 cm/s    Systemic Diam: 1.80 cm MV Decel Time: 468 msec MR Peak grad: 43.8 mmHg MR Vmax:      331.00 cm/s MV E velocity: 73.30 cm/s MV A velocity: 127.00 cm/s MV E/A ratio:  0.58 Rudean Haskell MD Electronically signed by  Rudean Haskell MD Signature Date/Time: 12/21/2020/11:24:53 AM    Final     PHYSICAL EXAM  Temp:  [98.4 F (36.9 C)-98.5 F (36.9 C)] 98.4 F (36.9 C) (05/05 1553) Pulse Rate:  [66-83] 77 (05/05 1553) Resp:  [16-26] 21 (05/05 1553) BP: (109-134)/(60-75) 118/69 (05/05 1553) SpO2:  [90 %-100 %] 96 % (05/05 1553)  General - Well nourished, well developed, in no apparent distress.  Ophthalmologic - fundi not visualized due to noncooperation.  Cardiovascular - Regular rhythm and rate.  Mental Status -  Level of arousal and orientation to time, place, and person were intact. Language including expression, naming, repetition, comprehension was assessed and found intact.  Cranial Nerves II - XII - II - Visual field intact OU. III, IV, VI - Extraocular movements intact. V -  Facial sensation intact bilaterally. VII - Facial movement intact bilaterally. VIII - Hearing & vestibular intact bilaterally. X - Palate elevates symmetrically. XI - Chin turning & shoulder shrug intact bilaterally. XII - Tongue protrusion intact.  Motor Strength - The patient's strength was symmetrical in all extremities and pronator drift was absent.  Bulk was normal and fasciculations were absent.   Motor Tone - Muscle tone was assessed at the neck and appendages and was normal.  Reflexes - The patient's reflexes were symmetrical in all extremities and she had no pathological reflexes.  Sensory - Light touch, temperature/pinprick were assessed and were symmetrical.    Coordination - The patient had normal movements in the hands with no ataxia or dysmetria.  Tremor was absent.  Gait and Station - deferred.   ASSESSMENT/PLAN Ms. SHERETHA SHADD is a 80 y.o. female with history of coronary artery disease, hyperlipidemia, prior strokes with no residual deficits, hypertension, sleep apnea, thrombocytosis history with recent chronic thrombocytopenia with concern for myelodysplastic syndrome being worked up as  outpatient, prior subdural hematoma status post craniotomy at [facility] in 2008, presented for evaluations of multiple falls, last one 2 wks ago.  Acute SDH on Chronic SDH  CT head acute parafalcine SDH 50m, stable chronic L cerebral SDH at crani site  Repeat CT head SDH 850m NSY consulted (JRonnald Ramps/o off, not a surgical candidate  Pt not antiplatelet or anticoagulation candidate  Stroke:   Multiple acute cerebral and cerebellar infarcts, cardioembolic, unclear source   CT head Chronic R parietal cortical infarct.  Small vessel disease.   CTA head and neck no significant stenosis. 39m13mupraclinoid L ICA aneurysm   MRI  Multiple B cerebral and cerebellar "watershed" infarcts  2D Echo w/ bubble EF 65-70%, LA mildly dilated.   CT C/A/P pending to rule out malignancy  Loop recorder so far no A. fib  LDL 25  HgbA1c 5.8  VTE prophylaxis - SCDs   clopidogrel 75 mg daily prior to admission, now on No antithrombotic given SDH.  Patient not a candidate for antiplatelet or anticoagulation  Therapy recommendations:  pending   Disposition:  pending   Pneumonia UTI  CTA neck showed RUL, RLL and LUL PNA.   UA WBC 11-20  On azithromycin and cefepime  ? MDS  Thrombocytosis history with recent chronic thrombocytopenia and leukocytosis with concern for myelodysplastic syndrome   Bone marrow biopsy planned soon  WBC 28.2->23.2  Platelet 172-> 154  Follow-up with hematology as outpatient  Hypertension  BP Stable < 140 . Long-term BP goal < 140 given recurrent SDH  Hyperlipidemia  Home meds:  liptior 80, resumed in hospital  LDL 25, goal < 70   Decrease Lipitor 40  Continue statin at discharge  Hx stroke/TIA  11/2019 -  Bilateral scattered posterior and anterior infarcts, cardioembolic pattern, occult afib vs. Procedure related with aorta cross clamping. Loop recorder placed. D/c DAPT x 3 wks following plavix load, then plavix alone.  Loop recorder so far no A.  fib  Other Stroke Risk Factors  Advanced Age >/= 65 70Family hx stroke (father)  Coronary artery disease s/p CABG 11/04/2019  Obstructive sleep apnea  Congestive heart failure  Other Active Problems  [facility]  I had long discussion with patient and daughter at bedside, updated pt current condition, treatment plan and potential prognosis, and answered all the questions.  They expressed understanding and appreciation.  I also discussed  with Dr. Starla Link. I spent  35 minutes in total face-to-face time with the patient, more than 50% of which was spent in counseling and coordination of care, reviewing test results, images and medication, and discussing the diagnosis, treatment plan and potential prognosis. This patient's care requiresreview of multiple databases, neurological assessment, discussion with family, other specialists and medical decision making of high complexity.   Rosalin Hawking, MD PhD Stroke Neurology 12/21/2020 4:18 PM    To contact Stroke Continuity provider, please refer to [email or web address] After hours, contact General Neurology

## 2020-12-21 NOTE — Evaluation (Signed)
Physical Therapy Evaluation Patient Details Name: Yolanda Flowers MRN: [id number] DOB: 12-Dec-1940 Today's Date: 12/21/2020   History of Present Illness  Pt is an 80 y/o female admitted secondary to multiple falls. CT showed Acute parafalcine subdural hematoma and MRI showed Multifocal acute ischemia throughout both cerebral and cerebellar  hemispheres, in a pattern most suggestive of watershed infarcts. PMH includes CVA, COPD, and CAD s/p CABG.  Clinical Impression  Pt admitted secondary to problem above with deficits below. Pt presenting with increased weakness. Initially with posterior lean, but was able to correct with assist. Requiring min to mod A to stand and transfer to/from [facility]. Pt with notable weakness during transfer as bilateral knees began to give way at end of transfer. Feel pt would benefit from CIR level therapies to increase independence and safety. Will continue to follow acutely.     Follow Up Recommendations CIR    Equipment Recommendations  Other (comment) (TBD)    Recommendations for Other Services       Precautions / Restrictions Precautions Precautions: Fall Precaution Comments: Multiple falls Restrictions Weight Bearing Restrictions: No      Mobility  Bed Mobility Overal bed mobility: Needs Assistance Bed Mobility: Supine to Sit;Sit to Supine     Supine to sit: Min assist Sit to supine: Min assist   General bed mobility comments: Min A for trunk and LE assist throughout.    Transfers Overall transfer level: Needs assistance Equipment used: Rolling walker (2 wheeled) Transfers: Sit to/from [facility] Sit to Stand: Min assist Stand pivot transfers: Min assist;Mod assist       General transfer comment: Pt requiring min A for lift assist and steadying to stand. Mild posterior lean initially and required assist to come to fully upright. initially requiring min A for steadying, but then required mod A towards end of transfer as pt's  knees began to give way.  Ambulation/Gait                Stairs            Wheelchair Mobility    Modified Rankin (Stroke Patients Only)       Balance Overall balance assessment: Needs assistance Sitting-balance support: No upper extremity supported;Feet supported Sitting balance-Leahy Scale: Fair     Standing balance support: Bilateral upper extremity supported;During functional activity Standing balance-Leahy Scale: Poor Standing balance comment: Reliant on BUE and external support                             Pertinent Vitals/Pain Pain Assessment: No/denies pain    Home Living Family/patient expects to be discharged to:: Private residence Living Arrangements: Alone Available Help at Discharge: Family;Available 24 hours/day Type of Home: House Home Access: Stairs to enter Entrance Stairs-Rails: None Entrance Stairs-Number of Steps: 1 Home Layout: One level Home Equipment: Clinical cytogeneticist - 2 wheels;Cane - single point      Prior Function Level of Independence: Independent         Comments: Reports she has not been using any AD, but reports it has been recommended for her to use one.     Hand Dominance        Extremity/Trunk Assessment   Upper Extremity Assessment Upper Extremity Assessment: Defer to OT evaluation    Lower Extremity Assessment Lower Extremity Assessment: Generalized weakness    Cervical / Trunk Assessment Cervical / Trunk Assessment: Kyphotic  Communication   Communication: No difficulties  Cognition Arousal/Alertness:  Awake/alert Behavior During Therapy: WFL for tasks assessed/performed Overall Cognitive Status: No family/caregiver present to determine baseline cognitive functioning                                 General Comments: A and O X4      General Comments      Exercises     Assessment/Plan    PT Assessment Patient needs continued PT services  PT Problem List Decreased  strength;Decreased balance;Decreased activity tolerance;Decreased mobility;Decreased knowledge of use of DME;Decreased knowledge of precautions       PT Treatment Interventions DME instruction;Gait training;Functional mobility training;Therapeutic exercise;Therapeutic activities;Balance training;Patient/family education;Neuromuscular re-education    PT Goals (Current goals can be found in the Care Plan section)  Acute Rehab PT Goals Patient Stated Goal: to get stronger before going home PT Goal Formulation: With patient Time For Goal Achievement: 01/04/21 Potential to Achieve Goals: Fair    Frequency Min 4X/week   Barriers to discharge        Co-evaluation               AM-PAC PT "6 Clicks" Mobility  Outcome Measure Help needed turning from your back to your side while in a flat bed without using bedrails?: A Little Help needed moving from lying on your back to sitting on the side of a flat bed without using bedrails?: A Little Help needed moving to and from a bed to a chair (including a wheelchair)?: A Lot Help needed standing up from a chair using your arms (e.g., wheelchair or bedside chair)?: A Little Help needed to walk in hospital room?: A Lot Help needed climbing 3-5 steps with a railing? : A Lot 6 Click Score: 15    End of Session Equipment Utilized During Treatment: Gait belt Activity Tolerance: Patient tolerated treatment well Patient left: in bed;with call bell/phone within reach (on stretcher on ED) Nurse Communication: Mobility status PT Visit Diagnosis: Unsteadiness on feet (R26.81);Muscle weakness (generalized) (M62.81)    Time: 8299-3716 PT Time Calculation (min) (ACUTE ONLY): 23 min   Charges:   PT Evaluation $PT Eval Moderate Complexity: 1 Mod PT Treatments $Therapeutic Activity: 8-22 mins        Lou Miner, DPT  Acute Rehabilitation Services  Pager: [phone] Office: [phone]   Rudean Hitt 12/21/2020, 3:41  PM

## 2020-12-21 NOTE — Progress Notes (Signed)
Subjective: Patient reports only mild headache, no visual changes or NTW  Objective: Vital signs in last 24 hours: Temp:  [98.3 F (36.8 C)-98.5 F (36.9 C)] 98.5 F (36.9 C) (05/04 1800) Pulse Rate:  [66-73] 71 (05/05 0707) Resp:  [13-26] 17 (05/05 0707) BP: (92-138)/(60-84) 125/73 (05/05 0707) SpO2:  [90 %-100 %] 93 % (05/05 0707) Weight:  [77.1 kg] 77.1 kg (05/04 1428)  Intake/Output from previous day: 05/04 0701 - 05/05 0700 In: 350 [IV Piggyback:350] Out: -  Intake/Output this shift: No intake/output data recorded.  Neurologic: Grossly normal, awake alert and conversant  Lab Results: Lab Results  Component Value Date   WBC 23.2 (H) 12/21/2020   HGB 9.5 (L) 12/21/2020   HCT 31.5 (L) 12/21/2020   MCV 102.3 (H) 12/21/2020   PLT 154 12/21/2020   Lab Results  Component Value Date   INR 1.3 (H) 12/21/2020   BMET Lab Results  Component Value Date   NA 138 12/21/2020   K 4.3 12/21/2020   CL 106 12/21/2020   CO2 22 12/21/2020   GLUCOSE 97 12/21/2020   BUN 11 12/21/2020   CREATININE 0.94 12/21/2020   CALCIUM 8.5 (L) 12/21/2020    Studies/Results: CT Angio Head W or Wo Contrast  Result Date: 12/20/2020 CLINICAL DATA:  Neuro deficit, acute, stroke suspected; history of intracerebral aneurysm, headache for several days, multiple falls today, pain in neck, rule out bleed or aneurysmal change. EXAM: CT ANGIOGRAPHY HEAD AND NECK TECHNIQUE: Multidetector CT imaging of the head and neck was performed using the standard protocol during bolus administration of intravenous contrast. Multiplanar CT image reconstructions and MIPs were obtained to evaluate the vascular anatomy. Carotid stenosis measurements (when applicable) are obtained utilizing NASCET criteria, using the distal internal carotid diameter as the denominator. CONTRAST:  37mL OMNIPAQUE IOHEXOL 350 MG/ML SOLN COMPARISON:  Brain MRI 11/25/2019. CT angiogram head/neck 11/25/2019. FINDINGS: CT HEAD FINDINGS Brain: Mild  cerebral and cerebellar atrophy. Acute parafalcine subdural hematoma measuring up to 10 mm in greatest thickness (series 5, image 11). Unchanged size of a thin chronic subdural hematoma overlying the left cerebral hemisphere, measuring up to 4 mm, deep to the left-sided cranioplasty. Subacute infarct within the left thalamus. Additional known small subacute infarcts within the supratentorial and infratentorial brain were better appreciated on the prior brain MRI of 11/25/2019. 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 Advanced patchy and ill-defined hypoattenuation within the cerebral white matter, nonspecific but compatible with chronic small vessel ischemic disease. No acute demarcated cortical infarct. No evidence of intracranial mass. No midline shift. Vascular: No hyperdense vessel.  Atherosclerotic calcifications Skull: No calvarial fracture.  Left-sided cranioplasty. Sinuses: Scattered trace mucosal thickening and fluid within the bilateral ethmoid air cells. Small right maxillary sinus mucous retention cyst. Orbits: No mass or acute finding. Review of the MIP images confirms the above findings CTA NECK FINDINGS Aortic arch: Standard aortic branching. Atherosclerotic plaque within the visualized aortic arch and proximal major branch vessels of the neck. No hemodynamically significant innominate or proximal subclavian artery stenosis. Right carotid system: CCA and ICA patent within the neck without significant stenosis (50% or greater). Mild calcified plaque within the carotid bifurcation and proximal ICA. Left carotid system: CCA and ICA patent within the neck without significant stenosis (50% or greater). Mild soft and calcified plaque within the carotid bifurcation and proximal ICA. Vertebral arteries: Codominant patent within the neck without stenosis. Mild nonstenotic calcified plaque at the origin of the right vertebral artery. Skeleton: No acute bony abnormality  or aggressive  osseous lesion. Cervical spondylosis. Cervical levocurvature with partially imaged thoracic dextrocurvature. Other neck: Neck mass or cervical lymphadenopathy. Upper chest: Consolidation within portions of the right upper lobe and within imaged portions of the superior right lower lobe compatible with pneumonia. A small ill-defined opacity also present within the left lung apex likely reflecting an additional site of pneumonia. Prior median sternotomy. Review of the MIP images confirms the above findings CTA HEAD FINDINGS Anterior circulation: The intracranial internal carotid arteries are patent. Plaque within both vessels without stenosis. Unchanged 2 mm inferomedially projecting aneurysm arising from the supraclinoid left ICA (series 12, image 122). The M1 middle cerebral arteries are patent. No M2 proximal branch occlusion or high-grade proximal stenosis is identified. The anterior cerebral arteries are patent. Posterior circulation: The intracranial vertebral arteries are patent. The basilar artery is patent. The posterior cerebral arteries are patent. Posterior communicating arteries are hypoplastic or absent bilaterally. Venous sinuses: Within the limitations of contrast timing, no convincing thrombus. Anatomic variants: As described Review of the MIP images confirms the above findings Acute CT head findings called by telephone at the time of interpretation on 12/20/2020 at 7:55 pm to provider [facility] , who verbally acknowledged these results. IMPRESSION: CT head: 1. Acute parafalcine subdural hematoma measuring up to 10 mm in greatest thickness. 2. Unchanged thin chronic subdural hematoma overlying the left cerebral hemisphere, deep to the left-sided cranioplasty (4 mm in thickness). 3. Subacute infarct within the left thalamus. Additional known small subacute infarcts within the supratentorial and infratentorial brain were better appreciated on the prior brain MRI of 11/25/2019. 4. Redemonstrated  small chronic right parietal lobe cortical infarct. 5. Stable parenchymal atrophy and severe cerebral white matter chronic small vessel ischemic disease. 6. Mild paranasal sinus disease, as described. CTA neck: 1. The common carotid, internal carotid and vertebral arteries are patent within the neck without hemodynamically significant stenosis. Atherosclerotic disease, as described. 2. Findings compatible right upper lobe, right lower lobe and left upper lobe pneumonia. Right lower lobe pneumonia is incompletely imaged. CTA head: 1. No intracranial large vessel occlusion or proximal high-grade arterial stenosis. 2. 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 Electronically Signed   By: Kellie Simmering DO   On: 12/20/2020 19:57   DG Chest 2 View  Result Date: 12/20/2020 CLINICAL DATA:  Dyspnea, fatigue, congestive heart failure EXAM: CHEST - 2 VIEW COMPARISON:  12/22/2019 FINDINGS: There is focal consolidation within the posterior right upper lobe, likely infectious in the acute setting. The chin overlies the lung apices, however, no definite pneumothorax. No pleural effusion. Cardiac size is mildly enlarged. Coronary artery bypass grafting has been performed. The pulmonary vascularity is normal. No acute bone abnormality. IMPRESSION: Right upper lobe consolidation in keeping with acute lobar pneumonia in the appropriate clinical setting. Follow-up chest radiograph is recommended in 3-4 weeks to document resolution and exclude the presence of a central obstructing lesion. Electronically Signed   By: Fidela Salisbury MD   On: 12/20/2020 16:32   CT HEAD WO CONTRAST  Result Date: 12/21/2020 CLINICAL DATA:  Subdural hemorrhage.  Stroke follow-up EXAM: CT HEAD WITHOUT CONTRAST TECHNIQUE: Contiguous axial images were obtained from the base of the skull through the vertex without intravenous contrast. COMPARISON:  Brain MRI from earlier today FINDINGS: Brain: High-density  subdural hematoma along the falx measuring up to 8 mm in thickness, non progressed. No associated significant mass effect. Extensive acute and chronic ischemia as characterized by MRI  earlier the same day. No hydrocephalus or shift. Vascular: Negative Skull: Unremarkable remote left craniotomy. Sinuses/Orbits: Bilateral cataract resection IMPRESSION: 1. No interval progression of the inter hemispheric subdural hematoma which measures up to 8 mm in thickness. 2. Acute and chronic ischemia, reference preceding brain MRI. Electronically Signed   By: Monte Fantasia M.D.   On: 12/21/2020 05:01   CT Angio Neck W and/or Wo Contrast  Result Date: 12/20/2020 CLINICAL DATA:  Neuro deficit, acute, stroke suspected; history of intracerebral aneurysm, headache for several days, multiple falls today, pain in neck, rule out bleed or aneurysmal change. EXAM: CT ANGIOGRAPHY HEAD AND NECK TECHNIQUE: Multidetector CT imaging of the head and neck was performed using the standard protocol during bolus administration of intravenous contrast. Multiplanar CT image reconstructions and MIPs were obtained to evaluate the vascular anatomy. Carotid stenosis measurements (when applicable) are obtained utilizing NASCET criteria, using the distal internal carotid diameter as the denominator. CONTRAST:  58mL OMNIPAQUE IOHEXOL 350 MG/ML SOLN COMPARISON:  Brain MRI 11/25/2019. CT angiogram head/neck 11/25/2019. FINDINGS: CT HEAD FINDINGS Brain: Mild cerebral and cerebellar atrophy. Acute parafalcine subdural hematoma measuring up to 10 mm in greatest thickness (series 5, image 11). Unchanged size of a thin chronic subdural hematoma overlying the left cerebral hemisphere, measuring up to 4 mm, deep to the left-sided cranioplasty. Subacute infarct within the left thalamus. Additional known small subacute infarcts within the supratentorial and infratentorial brain were better appreciated on the prior brain MRI of 11/25/2019. Redemonstrated small  chronic right parietal lobe cortical infarct. Advanced patchy and ill-defined hypoattenuation within the cerebral white matter, nonspecific but compatible with chronic small vessel ischemic disease. No acute demarcated cortical infarct. No evidence of intracranial mass. No midline shift. Vascular: No hyperdense vessel.  Atherosclerotic calcifications Skull: No calvarial fracture.  Left-sided cranioplasty. Sinuses: Scattered trace mucosal thickening and fluid within the bilateral ethmoid air cells. Small right maxillary sinus mucous retention cyst. Orbits: No mass or acute finding. Review of the MIP images confirms the above findings CTA NECK FINDINGS Aortic arch: Standard aortic branching. Atherosclerotic plaque within the visualized aortic arch and proximal major branch vessels of the neck. No hemodynamically significant innominate or proximal subclavian artery stenosis. Right carotid system: CCA and ICA patent within the neck without significant stenosis (50% or greater). Mild calcified plaque within the carotid bifurcation and proximal ICA. Left carotid system: CCA and ICA patent within the neck without significant stenosis (50% or greater). Mild soft and calcified plaque within the carotid bifurcation and proximal ICA. Vertebral arteries: Codominant patent within the neck without stenosis. Mild nonstenotic calcified plaque at the origin of the right vertebral artery. Skeleton: No acute bony abnormality or aggressive osseous lesion. Cervical spondylosis. Cervical levocurvature with partially imaged thoracic dextrocurvature. Other neck: Neck mass or cervical lymphadenopathy. Upper chest: Consolidation within portions of the right upper lobe and within imaged portions of the superior right lower lobe compatible with pneumonia. A small ill-defined opacity also present within the left lung apex likely reflecting an additional site of pneumonia. Prior median sternotomy. Review of the MIP images confirms the above  findings CTA HEAD FINDINGS Anterior circulation: The intracranial internal carotid arteries are patent. Plaque within both vessels without stenosis. Unchanged 2 mm inferomedially projecting aneurysm arising from the supraclinoid left ICA (series 12, image 122). The M1 middle cerebral arteries are patent. No M2 proximal branch occlusion or high-grade proximal stenosis is identified. The anterior cerebral arteries are patent. Posterior circulation: The intracranial vertebral arteries are patent. The basilar artery is  patent. The posterior cerebral arteries are patent. Posterior communicating arteries are hypoplastic or absent bilaterally. Venous sinuses: Within the limitations of contrast timing, no convincing thrombus. Anatomic variants: As described Review of the MIP images confirms the above findings Acute CT head findings called by telephone at the time of interpretation on 12/20/2020 at 7:55 pm to provider [facility] , who verbally acknowledged these results. IMPRESSION: CT head: 1. Acute parafalcine subdural hematoma measuring up to 10 mm in greatest thickness. 2. Unchanged thin chronic subdural hematoma overlying the left cerebral hemisphere, deep to the left-sided cranioplasty (4 mm in thickness). 3. Subacute infarct within the left thalamus. Additional known small subacute infarcts within the supratentorial and infratentorial brain were better appreciated on the prior brain MRI of 11/25/2019. 4. 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 5. Stable parenchymal atrophy and severe cerebral white matter chronic small vessel ischemic disease. 6. Mild paranasal sinus disease, as described. CTA neck: 1. The common carotid, internal carotid and vertebral arteries are patent within the neck without hemodynamically significant stenosis. Atherosclerotic disease, as described. 2. Findings compatible right upper lobe, right lower lobe and left upper lobe pneumonia. Right lower lobe pneumonia  is incompletely imaged. CTA head: 1. No intracranial large vessel occlusion or proximal high-grade arterial stenosis. 2. 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 Electronically Signed   By: Kellie Simmering DO   On: 12/20/2020 19:57   MR BRAIN WO CONTRAST  Result Date: 12/21/2020 CLINICAL DATA:  Stroke symptoms and parafalcine subdural hematoma EXAM: MRI HEAD WITHOUT CONTRAST TECHNIQUE: Multiplanar, multiecho pulse sequences of the brain and surrounding structures were obtained without intravenous contrast. COMPARISON:  Head CT 12/20/2020 FINDINGS: Brain: There is multifocal acute ischemia throughout both cerebral and cerebellar hemispheres. The pattern is most suggestive watershed infarcts. The subdural hematoma along the falx cerebri is unchanged. Hyperintense T2-weighted signal is moderately widespread throughout the white matter. Generalized volume loss without a clear lobar predilection. The midline structures are normal. Vascular: Major flow voids are preserved. Skull and upper cervical spine: Remote left pterional craniotomy. Sinuses/Orbits:No paranasal sinus fluid levels or advanced mucosal thickening. No mastoid or middle ear effusion. Normal orbits. IMPRESSION: 1. Multifocal acute ischemia throughout both cerebral and cerebellar hemispheres, in a pattern most suggestive of watershed infarcts. 2. Unchanged subdural hematoma along the falx cerebri. Electronically Signed   By: Ulyses Jarred M.D.   On: 12/21/2020 00:55   DG Knee Complete 4 Views Left  Result Date: 12/20/2020 CLINICAL DATA:  Pain following fall EXAM: LEFT KNEE - COMPLETE 4+ VIEW COMPARISON:  None. FINDINGS: Frontal, lateral, and bilateral oblique views were obtained. There is no fracture or dislocation. No joint effusion. There is moderate narrowing medially and in the patellofemoral joint region. There is spurring in all compartments. No erosive changes. There are foci of arterial vascular  calcification. 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 No fracture, dislocation, or effusion. Atherosclerotic arterial vascular calcifications noted. Electronically Signed   By: Lowella Grip III M.D.   On: 12/20/2020 16:28   DG Knee Complete 4 Views Right  Result Date: 12/20/2020 CLINICAL DATA:  Pain following fall EXAM: RIGHT KNEE - COMPLETE 4+ VIEW COMPARISON:  None. FINDINGS: Frontal, lateral, and bilateral oblique views were obtained. No appreciable fracture or dislocation. No joint effusion. There is moderate narrowing of the patellofemoral joint. There is mild narrowing medially. There is spurring in all compartments. No erosive change. There are multiple foci of  arterial vascular calcification. Surgical clips are noted medial to the proximal tibia. IMPRESSION: Osteoarthritic change early, primarily in the patellofemoral joint and medial compartments. No fracture, dislocation, or effusion. Foci of atherosclerotic arterial vascular calcification noted. Electronically Signed   By: Lowella Grip III M.D.   On: 12/20/2020 16:29    Assessment/Plan: Stable SD blood along falx. No indication for surgery. Will sign off, please call for any questions  Estimated body mass index is 27.44 kg/m as calculated from the following:   Height as of this encounter: 5\' 6"  (1.676 m).   Weight as of this encounter: 77.1 kg.    LOS: 0 days    Eustace Moore 12/21/2020, 8:13 AM

## 2020-12-21 NOTE — Consult Note (Signed)
Neurology Consultation  Reason for Consult: Stroke Referring Physician: Dr. Cyd Silence  CC: Falls  History is obtained from: Chart review, patient  HPI: Yolanda Flowers is a 80 y.o. female past medical history of coronary artery disease, hyperlipidemia, prior strokes with no residual deficits, hypertension, sleep apnea, thrombocytosis history with recent chronic thrombocytopenia with concern for myelodysplastic syndrome being worked up as outpatient, prior subdural hematoma status post craniotomy at [facility] in 2008, presented for evaluations of multiple falls.  Unclear last known well in terms of any sudden onset of neurological symptoms-reports a grocery store fall 2 weeks ago and more falls during the last [redacted] weeks along with worsening generalized fatigue.  Unfortunately at the time of my encounter, I was around 3:45 AM and no family member was available at bedside to provide further history.  She was evaluated in the emergency room, noncontrast head CT showed a concern for subacute left thalamic stroke in addition to falcine subdural hematoma.  Neurosurgical consultation was obtained and conservative management recommended.  Due to the concern for a subacute/acute left thalamic stroke on the CT, MRI brain was performed.  The left thymic stroke was likely chronic but the MRI brain without contrast revealed scattered embolic-looking infarcts and bilateral anterior and posterior distribution raising concern for underlying embolic etiology.  During her last stroke work-up in 2021, she was discharged home with recommendations for long-term cardiac monitoring-which was placed last year.  She follows up with with [facility] cardiology-last Linq device remote check with no arrhythmias.  Patient also complained of foul-smelling urine and upper respiratory symptoms of cough and congestion ongoing for the past few days.  She is being treated for presumptive respiratory infection and UTI with antibiotics   LKW:  Unclear tpa given?: no, unclear last known well, subdural hematoma Premorbid modified Rankin scale (mRS): 1  ROS: Full ROS was performed and is negative except as noted in the HPI.   Past Medical History:  Diagnosis Date  . Abdominal pain   . Accelerating angina ([place]) 08/26/2019  . Acute blood loss anemia 12/14/2019  . Allergic rhinitis 06/21/2014  . Anxiety   . Arthritis   . Asthma   . Atherosclerosis of arteries 08/24/2019  . Bilateral edema of lower extremity   . Body mass index 29.0-29.9, adult 08/24/2019  . CAD (coronary artery disease) 10/25/2019  . Cellulitis of right leg   . Combined hyperlipidemia 08/24/2019  . Complication of anesthesia    difficulty waking  . Congestive heart failure (CHF) ([place])   . COPD (chronic obstructive pulmonary disease) ([place])   . Coronary artery disease   . Dyspnea 02/09/2019  . Dyspnea on exertion   . Embolic stroke ([place]) 9/62/9528  . Essential thrombocythemia ([place]) 06/22/2014  . Essential thrombocytosis ([place]) 06/22/2014  . Gastrointestinal bleeding 06/23/2018  . GERD (gastroesophageal reflux disease)   . History of melanoma excision    left greast toe 2015  . History of squamous cell carcinoma excision    face--  multiple excisions  . History of subdural hematoma    2008  . HTN (hypertension) 06/21/2014  . Hypertension   . Ischial bursitis, right 08/24/2019  . Leucocytosis 12/14/2019  . Malignant melanoma of great toe ([place]) 08/24/2019  . Melanoma in situ ([place]) 06/29/2014  . OSA (obstructive sleep apnea)    intolerant  . Pedal edema 08/24/2019  . Renal insufficiency 08/24/2019  . S/P CABG x 4 11/04/2019  . Skin cancer 08/24/2019  . Solitary pulmonary nodule on lung CT 02/09/2019  .  Squamous cell carcinoma, scalp/neck 08/24/2019  . Stroke due to embolism ([place]) 11/25/2019  . Swelling of limb 08/24/2019  . Thrombocytosis    takes hydoxyurea--  MONITORED BY DR Hinton Rao [facility])  . VIN III (vulvar intraepithelial neoplasia III)   . Vitamin B 12  deficiency 08/24/2019  . Vitamin D deficiency 08/24/2019  . Wears dentures    UPPER AND LOWER PARTIAL  . Wears glasses      Family History  Problem Relation Age of Onset  . Clotting disorder Mother   . Hypertension Mother   . Heart disease Mother   . Arthritis Mother   . Stroke Father   . Hypertension Father   . Heart disease Father   . Arthritis Father   . Emphysema Father   . Asthma Father   . Kidney failure Father   . Hypertension Sister   . Heart disease Sister   . Arthritis Sister   . Hypertension Brother   . Heart disease Brother   . Arthritis Brother      Social History:   reports that she has never smoked. She has never used smokeless tobacco. She reports that she does not drink alcohol and does not use drugs.  Medications  Current Facility-Administered Medications:  .  acetaminophen (TYLENOL) tablet 650 mg, 650 mg, Oral, Q4H PRN **OR** acetaminophen (TYLENOL) 160 MG/5ML solution 650 mg, 650 mg, Per Tube, Q4H PRN **OR** acetaminophen (TYLENOL) suppository 650 mg, 650 mg, Rectal, Q4H PRN, Shalhoub, Sherryll Burger, MD .  atorvastatin (LIPITOR) tablet 80 mg, 80 mg, Oral, Daily, Shalhoub, Sherryll Burger, MD .  azithromycin (ZITHROMAX) 500 mg in sodium chloride 0.9 % 250 mL IVPB, 500 mg, Intravenous, Q24H, Shalhoub, Sherryll Burger, MD, Stopped at 12/21/20 0306 .  cefTRIAXone (ROCEPHIN) 2 g in sodium chloride 0.9 % 100 mL IVPB, 2 g, Intravenous, Q24H, Shalhoub, Sherryll Burger, MD .  fluticasone (FLONASE) 50 MCG/ACT nasal spray 2 spray, 2 spray, Each Nare, QHS, Shalhoub, Sherryll Burger, MD .  ipratropium-albuterol (DUONEB) 0.5-2.5 (3) MG/3ML nebulizer solution 3 mL, 3 mL, Nebulization, Q4H PRN, Shalhoub, Sherryll Burger, MD .  metoprolol tartrate (LOPRESSOR) tablet 25 mg, 25 mg, Oral, BID, Shalhoub, Sherryll Burger, MD .  ondansetron (ZOFRAN) injection 4 mg, 4 mg, Intravenous, Q6H PRN, Shalhoub, Sherryll Burger, MD .  pantoprazole (PROTONIX) EC tablet 40 mg, 40 mg, Oral, Daily, Shalhoub, Sherryll Burger, MD .  polyethylene glycol  (MIRALAX / GLYCOLAX) packet 17 g, 17 g, Oral, Daily PRN, Shalhoub, Sherryll Burger, MD .  traMADol (ULTRAM) tablet 50 mg, 50 mg, Oral, Q6H PRN, Shalhoub, Sherryll Burger, MD  Current Outpatient Medications:  .  acetaminophen (TYLENOL) 325 MG tablet, Take 1-2 tablets (325-650 mg total) by mouth every 4 (four) hours as needed for mild pain., Disp: , Rfl:  .  ammonium lactate (LAC-HYDRIN) 12 % lotion, Apply 1 application topically as needed for dry skin., Disp: , Rfl:  .  Ascorbic Acid (VITAMIN C) 1000 MG tablet, Take 1,000 mg by mouth daily., Disp: , Rfl:  .  atorvastatin (LIPITOR) 80 MG tablet, Take 80 mg by mouth daily. Takes it at 6pm daily, Disp: , Rfl:  .  Calcium Carb-Cholecalciferol (CALCIUM 600 + D PO), Take 1 tablet by mouth in the morning and at bedtime. , Disp: , Rfl:  .  clopidogrel (PLAVIX) 75 MG tablet, Take 1 tablet (75 mg total) by mouth daily., Disp: 90 tablet, Rfl: 3 .  fluticasone (FLONASE) 50 MCG/ACT nasal spray, Place 2 sprays into both nostrils at  bedtime. , Disp: , Rfl:  .  Magnesium 250 MG TABS, Take 250 mg by mouth at bedtime. , Disp: , Rfl:  .  metoprolol tartrate (LOPRESSOR) 25 MG tablet, Take 1 tablet (25 mg total) by mouth 2 (two) times daily., Disp: 180 tablet, Rfl: 3 .  Multiple Vitamins-Minerals (PRESERVISION AREDS 2) CAPS, Take 1 capsule by mouth in the morning and at bedtime. , Disp: , Rfl:  .  pantoprazole (PROTONIX) 40 MG tablet, Take 1 tablet (40 mg total) by mouth daily., Disp: 90 tablet, Rfl: 3 .  traMADol (ULTRAM) 50 MG tablet, Take 1 tablet (50 mg total) by mouth every 6 (six) hours as needed for moderate pain., Disp: 30 tablet, Rfl:  .  Vitamin D, Ergocalciferol, (DRISDOL) 1.25 MG (50000 UT) CAPS capsule, Take 50,000 Units by mouth every 14 (fourteen) days., Disp: , Rfl:    Exam: Current vital signs: BP 109/66   Pulse 72   Temp 98.5 F (36.9 C) (Oral)   Resp (!) 23   Ht 5\' 6"  (1.676 m)   Wt 77.1 kg   SpO2 94%   BMI 27.44 kg/m  Vital signs in last 24  hours: Temp:  [98.3 F (36.8 C)-98.5 F (36.9 C)] 98.5 F (36.9 C) (05/04 1800) Pulse Rate:  [66-73] 72 (05/04 2315) Resp:  [13-26] 23 (05/04 2315) BP: (92-138)/(60-84) 109/66 (05/04 2315) SpO2:  [90 %-100 %] 94 % (05/04 2315) Weight:  [77.1 kg] 77.1 kg (05/04 1428) General comfortably sleeping in bed, awakens to voice, follows commands but is somewhat groggy which is understandable at this time around 3:45 AM HEENT: Normocephalic atraumatic  CVS: Regular rate rhythm Respiratory: Scattered rales Abdomen nondistended nontender Extremities warm well perfused Neurological exam Awake alert oriented x3 after waking her up from sleep. No dysarthria Aphasia Cranial nerve examination: Pupils equal round react light, extraocular movements intact, visual fields full to threat, facial symmetry preserved, facial sensation intact bilaterally, auditory acuity diminished bilaterally to conversational voice, palate midline, shoulder shrug intact, tongue midline Motor exam: Antigravity 5/5 without drift in all fours.  Normal tone.  Normal range of motion. Sensory exam: Intact to light touch without extinction Coordination: Intact finger-nose-finger testing. Gait testing deferred NIH stroke scale-0  Labs I have reviewed labs in epic and the results pertinent to this consultation are: Leukocytosis with white count 23, hemoglobin 9.5, platelet count 102,000, albumin 3, AST 13, calcium 8.5, normal kidney function  CBC    Component Value Date/Time   WBC 23.2 (H) 12/21/2020 0236   RBC 3.08 (L) 12/21/2020 0236   HGB 9.5 (L) 12/21/2020 0236   HGB 12.3 09/26/2020 0908   HCT 31.5 (L) 12/21/2020 0236   HCT 37.7 09/26/2020 0908   PLT 154 12/21/2020 0236   PLT 147 (L) 09/26/2020 0908   MCV 102.3 (H) 12/21/2020 0236   MCV 96 12/18/2020 0000   MCH 30.8 12/21/2020 0236   MCHC 30.2 12/21/2020 0236   RDW 16.9 (H) 12/21/2020 0236   RDW 14.7 09/26/2020 0908   LYMPHSABS 0.9 12/21/2020 0236   LYMPHSABS 3.5  (H) 09/26/2020 0908   MONOABS 0.9 12/21/2020 0236   EOSABS 0.0 12/21/2020 0236   EOSABS 0.2 09/26/2020 0908   BASOSABS 0.2 (H) 12/21/2020 0236   BASOSABS 0.6 (H) 09/26/2020 0908    CMP     Component Value Date/Time   NA 138 12/21/2020 0236   NA 139 12/18/2020 0000   K 4.3 12/21/2020 0236   CL 106 12/21/2020 0236   CO2 22 12/21/2020  0236   GLUCOSE 97 12/21/2020 0236   BUN 11 12/21/2020 0236   BUN 16 12/18/2020 0000   CREATININE 0.94 12/21/2020 0236   CALCIUM 8.5 (L) 12/21/2020 0236   PROT 6.8 12/21/2020 0236   PROT 7.9 09/26/2020 0908   ALBUMIN 3.0 (L) 12/21/2020 0236   ALBUMIN 4.7 09/26/2020 0908   AST 13 (L) 12/21/2020 0236   ALT 9 12/21/2020 0236   ALKPHOS 66 12/21/2020 0236   BILITOT 0.3 12/21/2020 0236   BILITOT 0.6 09/26/2020 0908   GFRNONAA >60 12/21/2020 0236   GFRAA 53 (L) 09/26/2020 0908     Imaging I have reviewed the images obtained:  CT-scan of the brain, CT angiography of the head and neck with acute parafalcine subdural hematoma measuring up to 10 mm in greatest thickness.  Unchanged and chronic subdural hematoma overlying the left cerebral hemisphere deep to the left-sided cranioplasty about 4 mm in thickness.  Subacute infarct within the left thalamus- on my personal review, this was probably present on the MRI of 11/25/2019.  Additional known small subacute infarcts within the supratentorial and infratentorial brain seen on brain MRI on 11/25/2019 better appreciated.  Redemonstrated chronic small right parietal lobe cortical infarct.  Stable parenchymal atrophy and small leg small vessel disease. Common carotid and internal carotid and vertebral arteries in the neck patent without hemodynamically significant stenosis.  Atherosclerotic disease present.  Also findings compatible with right upper lobe right lower lobe and left upper lobe pneumonia.  Right lower lobe pneumonias incompletely imaged.  No intracranial LVO.  2 mm aneurysm from the supraclinoid left  ICA  MRI examination of the brain multifocal punctate acute ischemic strokes in the cerebellar and cerebral hemispheres bilaterally-although read as suggestive of watershed infarcts, I strongly suspect that this is an embolic process and not a watershed hypoperfusion phenomenon. Unchanged subdural hematoma along the falx   Assessment:  80 year old with extensive cerebrovascular risk factor history along with prior strokes-embolic strokes with unknown source status post loop recorder placement, currently on Plavix at home, prior history of thrombocytosis but in the recent past with thrombocytopenia with being worked up for possible myelodysplastic syndrome, on Plavix at home, presenting for evaluation of multiple falls, generalized weakness-CT head with fall seen subdural hematoma and chest imaging suggestive of developing pneumonia as had an MRI done due to concern for subacute stroke on CT which revealed multifocal acute infarcts in the cerebral and cerebellar hemispheres bilaterally raising concern for an embolic process.  Impression:  Acute ischemic strokes involving bilateral cerebellar and cerebral hemispheres raising suspicion for an embolic process.  Concomitant acute subdural hematoma involving the falx cerebri  Chronic subdural hematoma over the left cerebral convexity  Coronary artery disease  Hypertension  Hyperlipidemia  Thrombocytopenia   Recommendations:  No antiplatelets or anticoagulants given the subdural hematoma  Admit to hospitalist  For neurochecks  Telemetry  CTA head and neck was already completed-no need to repeat vessel imaging  2D echo- might need TEE-decision after stroke team rounding  Will hold off on statin given the subdural as well  Check A1c  Check lipid panel  PT  OT  speech therapy  Blood pressure goals: Acute ischemic strokes are probably past the window for permissive hypertension.  Given the subdural hematoma blood pressure goal  systolic blood pressure less than 160.  Bedside swallow screen-n.p.o. until cleared bedside swallow formal swallow evaluation  Might need inpatient hematology consultation for recurrent thrombocytopenia.  I would recommend keeping the platelet count above 100,000 given the  acute bleed.  Given the acute subdural, will hold off on antiplatelets or anticoagulations- discussion for timing for resumption of antiplatelets or should she show any evidence of A. Fib(meaning need for anticoagulation)  will have to be done in conjunction with cardiology hematology neurosurgery and neurology-most likely on outpatient basis.  Discussed my plan preliminarily with Dr. Cyd Silence over the phone and then updated him with final plan upon completion of the note via secure chat.   -- Amie Portland, MD Neurologist [facility] Pager: [phone]

## 2020-12-21 NOTE — Progress Notes (Signed)
[name], female   DOB: 1940-12-07, 80 y.o.   MRN: [id number] Patient admitted early this morning for fall and was found to have small subdural hematoma and acute ischemic strokes.  Neurology and neurosurgery following.  Patient seen and examined at bedside and plan of care discussed with her.  I have reviewed patient's medical records including this morning's H&P, current vitals, medications and labs myself.  Repeat CT of the brain this morning did not show any worsening of subdural hematoma.  Follow further neurology recommendations.  PT/OT/SLP evaluation.

## 2020-12-21 NOTE — ED Notes (Signed)
Patient transported to MRI 

## 2020-12-21 NOTE — Progress Notes (Signed)
Attempted bilateral lower extremity venous duplex, however patient refused. Please notify us if/when patient is amenable to exam, and we will try again as schedule permits.  12/21/2020 2:32 PM Kelby Aline., MHA, RVT, RDCS, RDMS

## 2020-12-21 NOTE — ED Notes (Signed)
Pt refusing to take PO medications until she is able to eat lunch, will give medications when ordered meal tray arrives

## 2020-12-21 NOTE — Progress Notes (Signed)
*  PRELIMINARY RESULTS* Echocardiogram 2D Echocardiogram has been performed with bubble study.  Luisa Hart RDCS 12/21/2020, 8:51 AM

## 2020-12-21 NOTE — ED Notes (Signed)
Patient declined taking medications until she is able to eat. Meal tray at bedside

## 2020-12-22 ENCOUNTER — Inpatient Hospital Stay (HOSPITAL_COMMUNITY): Payer: PPO

## 2020-12-22 DIAGNOSIS — I639 Cerebral infarction, unspecified: Secondary | ICD-10-CM

## 2020-12-22 LAB — CBC WITH DIFFERENTIAL/PLATELET
Abs Immature Granulocytes: 1.67 10*3/uL — ABNORMAL HIGH (ref 0.00–0.07)
Basophils Absolute: 0.3 10*3/uL — ABNORMAL HIGH (ref 0.0–0.1)
Basophils Relative: 2 %
Eosinophils Absolute: 0.2 10*3/uL (ref 0.0–0.5)
Eosinophils Relative: 1 %
HCT: 32.4 % — ABNORMAL LOW (ref 36.0–46.0)
Hemoglobin: 10 g/dL — ABNORMAL LOW (ref 12.0–15.0)
Immature Granulocytes: 7 %
Lymphocytes Relative: 6 %
Lymphs Abs: 1.5 10*3/uL (ref 0.7–4.0)
MCH: 30.8 pg (ref 26.0–34.0)
MCHC: 30.9 g/dL (ref 30.0–36.0)
MCV: 99.7 fL (ref 80.0–100.0)
Monocytes Absolute: 2.1 10*3/uL — ABNORMAL HIGH (ref 0.1–1.0)
Monocytes Relative: 9 %
Neutro Abs: 17.3 10*3/uL — ABNORMAL HIGH (ref 1.7–7.7)
Neutrophils Relative %: 75 %
Platelets: 144 10*3/uL — ABNORMAL LOW (ref 150–400)
RBC: 3.25 MIL/uL — ABNORMAL LOW (ref 3.87–5.11)
RDW: 16.7 % — ABNORMAL HIGH (ref 11.5–15.5)
WBC: 23 10*3/uL — ABNORMAL HIGH (ref 4.0–10.5)
nRBC: 0.3 % — ABNORMAL HIGH (ref 0.0–0.2)

## 2020-12-22 LAB — BASIC METABOLIC PANEL
Anion gap: 6 (ref 5–15)
BUN: 11 mg/dL (ref 8–23)
CO2: 24 mmol/L (ref 22–32)
Calcium: 8.1 mg/dL — ABNORMAL LOW (ref 8.9–10.3)
Chloride: 107 mmol/L (ref 98–111)
Creatinine, Ser: 1.05 mg/dL — ABNORMAL HIGH (ref 0.44–1.00)
GFR, Estimated: 54 mL/min — ABNORMAL LOW (ref >60)
Glucose, Bld: 103 mg/dL — ABNORMAL HIGH (ref 70–99)
Potassium: 4.4 mmol/L (ref 3.5–5.1)
Sodium: 137 mmol/L (ref 135–145)

## 2020-12-22 LAB — URINE CULTURE: Culture: <10000 — AB

## 2020-12-22 LAB — MAGNESIUM: Magnesium: 2.3 mg/dL (ref 1.7–2.4)

## 2020-12-22 MED ORDER — SODIUM CHLORIDE 0.9 % IV SOLN: INTRAVENOUS | Status: DC | PRN

## 2020-12-22 NOTE — Consult Note (Incomplete)
Physical Medicine and Rehabilitation Consult Reason for Consult: Multiple falls with altered mental status Referring Physician: Joelene Millin   HPI: Yolanda Flowers is a 80 y.o. right-handed female with history of CVA/subdural hematoma 2009, COPD, CAD with CABG 10/2019, hypertension, hyperlipidemia, thrombocytopenia, chronic leukocytosis.  Per chart review patient lives alone independent prior to admission.  1 level home one-step to entry.  Patient does endorse multiple falls.  Presented 12/21/2020 with progressive weakness, cough, headache and frequent falls.  MRI of the brain showed multifocal acute ischemia throughout both cerebral and cerebellar hemispheres in a pattern most suggestive of watershed infarcts.  Unchanged subdural hematoma along the falx cerebral high.  CTA of the head and neck showed no large vessel occlusion or high-grade stenosis.  CT of the chest abdomen pelvis showed no acute intrathoracic intra-abdominal or intrapelvic trauma.  There was noted multifocal pneumonia most pronounced in the right upper and right lower lobe.  Nodular consolidation within the superior segment of left lower lobe measuring 9 mm.  Admission chemistries unremarkable, TSH 1.211, WBC 28,200, platelets 172,000, troponin 242, CK115, lactic acid 1.2, blood cultures no growth to date.  Echocardiogram with ejection fraction of 65 to 70% no wall motion abnormalities grade 1 diastolic dysfunction.  Attempts at lower extremity Dopplers but patient refused.  Presently maintained on Rocephin as well as Zithromax for CAP.  Tolerating regular diet.  Therapy evaluations completed due to patient decreased functional mobility recommendations of physical medicine rehab consult.   Review of Systems  Constitutional: Negative for chills and fever.  HENT: Negative for hearing loss.   Eyes: Negative for blurred vision and double vision.  Respiratory: Positive for cough.        Shortness of breath with exertion  Cardiovascular:  Positive for leg swelling. Negative for chest pain and palpitations.  Gastrointestinal: Positive for constipation. Negative for heartburn, nausea and vomiting.       GERD  Genitourinary: Negative for dysuria, flank pain and hematuria.  Musculoskeletal: Positive for falls, joint pain and myalgias.  Skin: Negative for rash.  Neurological: Positive for weakness.  All other systems reviewed and are negative.  Past Medical History:  Diagnosis Date  . Abdominal pain   . Accelerating angina ([place]) 08/26/2019  . Acute blood loss anemia 12/14/2019  . Allergic rhinitis 06/21/2014  . Anxiety   . Arthritis   . Asthma   . Atherosclerosis of arteries 08/24/2019  . Bilateral edema of lower extremity   . Body mass index 29.0-29.9, adult 08/24/2019  . CAD (coronary artery disease) 10/25/2019  . Cellulitis of right leg   . Combined hyperlipidemia 08/24/2019  . Complication of anesthesia    difficulty waking  . Congestive heart failure (CHF) ([place])   . COPD (chronic obstructive pulmonary disease) ([place])   . Coronary artery disease   . Dyspnea 02/09/2019  . Dyspnea on exertion   . Embolic stroke ([place]) [id number]  . Essential thrombocythemia ([place]) 06/22/2014  . Essential thrombocytosis ([place]) 06/22/2014  . Gastrointestinal bleeding 06/23/2018  . GERD (gastroesophageal reflux disease)   . History of melanoma excision    left greast toe 2015  . History of squamous cell carcinoma excision    face--  multiple excisions  . History of subdural hematoma    2008  . HTN (hypertension) 06/21/2014  . Hypertension   . Ischial bursitis, right 08/24/2019  . Leucocytosis 12/14/2019  . Malignant melanoma of great toe ([place]) 08/24/2019  . Melanoma in situ ([place]) 06/29/2014  . OSA (obstructive  sleep apnea)    intolerant  . Pedal edema 08/24/2019  . Renal insufficiency 08/24/2019  . S/P CABG x 4 11/04/2019  . Skin cancer 08/24/2019  . Solitary pulmonary nodule on lung CT 02/09/2019  . Squamous cell carcinoma, scalp/neck 08/24/2019  .  Stroke due to embolism ([place]) 11/25/2019  . Swelling of limb 08/24/2019  . Thrombocytosis    takes hydoxyurea--  MONITORED BY DR Hinton [facility])  . VIN III (vulvar intraepithelial neoplasia III)   . Vitamin B 12 deficiency 08/24/2019  . Vitamin D deficiency 08/24/2019  . Wears dentures    UPPER AND LOWER PARTIAL  . Wears glasses    Past Surgical History:  Procedure Laterality Date  . ABDOMINAL HYSTERECTOMY  age 54  . CARDIAC CATHETERIZATION  03/172021  . CORONARY ARTERY BYPASS GRAFT N/A 11/04/2019   Procedure: CORONARY ARTERY BYPASS GRAFTING (CABG) x 4, with ENDOSCOPIC HARVESTING OF RIGHT GREATER SAPHENOUS VEIN.;  Surgeon: Gaye Pollack, MD;  Location: [place] OR;  Service: Open Heart Surgery;  Laterality: N/A;  . INTRAVASCULAR ULTRASOUND/IVUS N/A 11/03/2019   Procedure: Intravascular Ultrasound/IVUS;  Surgeon: Nelva Bush, MD;  Location: [place] [facility];  Service: Cardiovascular;  Laterality: N/A;  . KNEE ARTHROSCOPY Left 2004  . LEFT HEART CATH AND CORONARY ANGIOGRAPHY N/A 11/03/2019   Procedure: LEFT HEART CATH AND CORONARY ANGIOGRAPHY;  Surgeon: Nelva Bush, MD;  Location: [place] [facility];  Service: Cardiovascular;  Laterality: N/A;  . LOOP RECORDER INSERTION N/A 11/30/2019   Procedure: LOOP RECORDER INSERTION;  Surgeon: Thompson Grayer, MD;  Location: [place] [facility];  Service: Cardiovascular;  Laterality: N/A;  . MELANOMA EXCISION  2015   left great toe  . REPAIR PERONEAL TENDONS ANKLE  2004  . SUBDURAL HEMATOMA EVACUATION VIA CRANIOTOMY  2008      week later  post-op  [facility]  . TEE WITHOUT CARDIOVERSION N/A 11/04/2019   Procedure: TRANSESOPHAGEAL ECHOCARDIOGRAM (TEE);  Surgeon: Gaye Pollack, MD;  Location: [place];  Service: Open Heart Surgery;  Laterality: N/A;  . VULVECTOMY N/A 10/24/2015   Procedure: WIDE LOCAL EXCISION OF THE VULVA ;  Surgeon: Everitt Amber, MD;  Location: [place];  Service: Gynecology;  Laterality: N/A;   Family  History  Problem Relation Age of Onset  . Clotting disorder Mother   . Hypertension Mother   . Heart disease Mother   . Arthritis Mother   . Stroke Father   . Hypertension Father   . Heart disease Father   . Arthritis Father   . Emphysema Father   . Asthma Father   . Kidney failure Father   . Hypertension Sister   . Heart disease Sister   . Arthritis Sister   . Hypertension Brother   . Heart disease Brother   . Arthritis Brother    Social History:  reports that she has never smoked. She has never used smokeless tobacco. She reports that she does not drink alcohol and does not use drugs. Allergies:  Allergies  Allergen Reactions  . Cephalexin Hives  . Penicillin G   . Ciprofloxacin Other (See Comments)    Gi intolerance  . Penicillins Rash    Childhood reaction Did it involve swelling of the face/tongue/throat, SOB, or low BP? No Did it involve sudden or severe rash/hives, skin peeling, or any reaction on the inside of your mouth or nose? No Did you need to seek medical attention at a hospital or doctor's office? No When did it  last happen?50 years ago If all above answers are "NO", may proceed with cephalosporin use.    Medications Prior to Admission  Medication Sig Dispense Refill  . acetaminophen (TYLENOL) 325 MG tablet Take 1-2 tablets (325-650 mg total) by mouth every 4 (four) hours as needed for mild pain.    Marland Kitchen ammonium lactate (LAC-HYDRIN) 12 % lotion Apply 1 application topically as needed for dry skin.    . Ascorbic Acid (VITAMIN C) 1000 MG tablet Take 1,000 mg by mouth daily.    Marland Kitchen atorvastatin (LIPITOR) 80 MG tablet Take 80 mg by mouth daily. Takes it at 6pm daily    . Calcium Carb-Cholecalciferol (CALCIUM 600 + D PO) Take 1 tablet by mouth in the morning and at bedtime.     . clopidogrel (PLAVIX) 75 MG tablet Take 1 tablet (75 mg total) by mouth daily. 90 tablet 3  . fluticasone (FLONASE) 50 MCG/ACT nasal spray Place 2 sprays into both nostrils at bedtime.      . Magnesium 250 MG TABS Take 250 mg by mouth at bedtime.     . metoprolol tartrate (LOPRESSOR) 25 MG tablet Take 1 tablet (25 mg total) by mouth 2 (two) times daily. 180 tablet 3  . Multiple Vitamins-Minerals (PRESERVISION AREDS 2) CAPS Take 1 capsule by mouth in the morning and at bedtime.     . pantoprazole (PROTONIX) 40 MG tablet Take 1 tablet (40 mg total) by mouth daily. 90 tablet 3  . traMADol (ULTRAM) 50 MG tablet Take 1 tablet (50 mg total) by mouth every 6 (six) hours as needed for moderate pain. 30 tablet   . Vitamin D, Ergocalciferol, (DRISDOL) 1.25 MG (50000 UT) CAPS capsule Take 50,000 Units by mouth every 14 (fourteen) days.      Home: Home Living Family/patient expects to be discharged to:: Private residence Living Arrangements: Alone Available Help at Discharge: Family,Available 24 hours/day Type of Home: House Home Access: Stairs to enter [employer] of Steps: 1 Entrance Stairs-Rails: None Home Layout: One level Bathroom Shower/Tub: Tub/shower Psychologist, occupational: Handicapped height Home Equipment: Media planner - 2 wheels,Cane - single point  Functional History: Prior Function Level of Independence: Independent Comments: Reports she has not been using any AD, but reports it has been recommended for her to use one. Functional Status:  Mobility: Bed Mobility Overal bed mobility: Needs Assistance Bed Mobility: Supine to Sit,Sit to Supine Supine to sit: Min assist Sit to supine: Min assist General bed mobility comments: Min A for trunk and LE assist throughout. Transfers Overall transfer level: Needs assistance Equipment used: Rolling walker (2 wheeled) Transfers: Sit to/from [employer] Sit to Stand: Min assist Stand pivot transfers: Min assist,Mod assist General transfer comment: Pt requiring min A for lift assist and steadying to stand. Mild posterior lean initially and required assist to come to fully  upright. initially requiring min A for steadying, but then required mod A towards end of transfer as pt's knees began to give way.      ADL:    Cognition: Cognition Overall Cognitive Status: No family/caregiver present to determine baseline cognitive functioning Orientation Level: Oriented X4 Cognition Arousal/Alertness: Awake/alert Behavior During Therapy: WFL for tasks assessed/performed Overall Cognitive Status: No family/caregiver present to determine baseline cognitive functioning General Comments: A and O X4  Blood pressure 98/67, pulse 69, temperature 98.1 F (36.7 C), temperature source Oral, resp. rate 17, height 5\' 6"  (1.676 m), weight 77.1 kg, SpO2 92 %. Physical Exam Neurological:  Comments: Patient is alert.  No acute distress.  Makes eye contact with examiner.  Oriented to person and place.  Follows commands.     No results found for this or any previous visit (from the past 24 hour(s)). CT Angio Head W or Wo Contrast  Result Date: 12/20/2020 CLINICAL DATA:  Neuro deficit, acute, stroke suspected; history of intracerebral aneurysm, headache for several days, multiple falls today, pain in neck, rule out bleed or aneurysmal change. EXAM: CT ANGIOGRAPHY HEAD AND NECK TECHNIQUE: Multidetector CT imaging of the head and neck was performed using the standard protocol during bolus administration of intravenous contrast. Multiplanar CT image reconstructions and MIPs were obtained to evaluate the vascular anatomy. Carotid stenosis measurements (when applicable) are obtained utilizing NASCET criteria, using the distal internal carotid diameter as the denominator. CONTRAST:  90mL OMNIPAQUE IOHEXOL 350 MG/ML SOLN COMPARISON:  Brain MRI 11/25/2019. CT angiogram head/neck 11/25/2019. FINDINGS: CT HEAD FINDINGS Brain: Mild cerebral and cerebellar atrophy. Acute parafalcine subdural hematoma measuring up to 10 mm in greatest thickness (series 5, image 11). Unchanged size of a thin chronic  subdural hematoma overlying the left cerebral hemisphere, measuring up to 4 mm, deep to the left-sided cranioplasty. Subacute infarct within the left thalamus. Additional known small subacute infarcts within the supratentorial and infratentorial brain were better appreciated on the prior brain MRI of 11/25/2019. 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 Advanced patchy and ill-defined hypoattenuation within the cerebral white matter, nonspecific but compatible with chronic small vessel ischemic disease. No acute demarcated cortical infarct. No evidence of intracranial mass. No midline shift. Vascular: No hyperdense vessel.  Atherosclerotic calcifications Skull: No calvarial fracture.  Left-sided cranioplasty. Sinuses: Scattered trace mucosal thickening and fluid within the bilateral ethmoid air cells. Small right maxillary sinus mucous retention cyst. Orbits: No mass or acute finding. Review of the MIP images confirms the above findings CTA NECK FINDINGS Aortic arch: Standard aortic branching. Atherosclerotic plaque within the visualized aortic arch and proximal major branch vessels of the neck. No hemodynamically significant innominate or proximal subclavian artery stenosis. Right carotid system: CCA and ICA patent within the neck without significant stenosis (50% or greater). Mild calcified plaque within the carotid bifurcation and proximal ICA. Left carotid system: CCA and ICA patent within the neck without significant stenosis (50% or greater). Mild soft and calcified plaque within the carotid bifurcation and proximal ICA. Vertebral arteries: Codominant patent within the neck without stenosis. Mild nonstenotic calcified plaque at the origin of the right vertebral artery. Skeleton: No acute bony abnormality or aggressive osseous lesion. Cervical spondylosis. Cervical levocurvature with partially imaged thoracic dextrocurvature. Other neck: Neck mass or cervical lymphadenopathy. Upper  chest: Consolidation within portions of the right upper lobe and within imaged portions of the superior right lower lobe compatible with pneumonia. A small ill-defined opacity also present within the left lung apex likely reflecting an additional site of pneumonia. Prior median sternotomy. Review of the MIP images confirms the above findings CTA HEAD FINDINGS Anterior circulation: The intracranial internal carotid arteries are patent. Plaque within both vessels without stenosis. Unchanged 2 mm inferomedially projecting aneurysm arising from the supraclinoid left ICA (series 12, image 122). The M1 middle cerebral arteries are patent. No M2 proximal branch occlusion or high-grade proximal stenosis is identified. The anterior cerebral arteries are patent. Posterior circulation: The intracranial vertebral arteries are patent. The basilar artery is patent. The posterior cerebral arteries are patent. Posterior communicating arteries are hypoplastic or absent bilaterally. Venous sinuses: Within the limitations of contrast  timing, no convincing thrombus. Anatomic variants: As described Review of the MIP images confirms the above findings Acute CT head findings called by telephone at the time of interpretation on 12/20/2020 at 7:55 pm to provider [facility] , who verbally acknowledged these results. IMPRESSION: CT head: 1. Acute parafalcine subdural hematoma measuring up to 10 mm in greatest thickness. 2. Unchanged thin chronic subdural hematoma overlying the left cerebral hemisphere, deep to the left-sided cranioplasty (4 mm in thickness). 3. Subacute infarct within the left thalamus. Additional known small subacute infarcts within the supratentorial and infratentorial brain were better appreciated on the prior brain MRI of 11/25/2019. 4. 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 5. Stable parenchymal atrophy and severe cerebral white matter chronic small vessel ischemic disease. 6. Mild  paranasal sinus disease, as described. CTA neck: 1. The common carotid, internal carotid and vertebral arteries are patent within the neck without hemodynamically significant stenosis. Atherosclerotic disease, as described. 2. Findings compatible right upper lobe, right lower lobe and left upper lobe pneumonia. Right lower lobe pneumonia is incompletely imaged. CTA head: 1. No intracranial large vessel occlusion or proximal high-grade arterial stenosis. 2. 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 Electronically Signed   By: Kellie Simmering DO   On: 12/20/2020 19:57   DG Chest 2 View  Result Date: 12/20/2020 CLINICAL DATA:  Dyspnea, fatigue, congestive heart failure EXAM: CHEST - 2 VIEW COMPARISON:  12/22/2019 FINDINGS: There is focal consolidation within the posterior right upper lobe, likely infectious in the acute setting. The chin overlies the lung apices, however, no definite pneumothorax. No pleural effusion. Cardiac size is mildly enlarged. Coronary artery bypass grafting has been performed. The pulmonary vascularity is normal. No acute bone abnormality. IMPRESSION: Right upper lobe consolidation in keeping with acute lobar pneumonia in the appropriate clinical setting. Follow-up chest radiograph is recommended in 3-4 weeks to document resolution and exclude the presence of a central obstructing lesion. Electronically Signed   By: Fidela Salisbury MD   On: 12/20/2020 16:32   CT HEAD WO CONTRAST  Result Date: 12/21/2020 CLINICAL DATA:  Subdural hemorrhage.  Stroke follow-up EXAM: CT HEAD WITHOUT CONTRAST TECHNIQUE: Contiguous axial images were obtained from the base of the skull through the vertex without intravenous contrast. COMPARISON:  Brain MRI from earlier today FINDINGS: Brain: High-density subdural hematoma along the falx measuring up to 8 mm in thickness, non progressed. No associated significant mass effect. Extensive acute and chronic ischemia as  characterized by MRI earlier the same day. No hydrocephalus or shift. Vascular: Negative Skull: Unremarkable remote left craniotomy. Sinuses/Orbits: Bilateral cataract resection IMPRESSION: 1. No interval progression of the inter hemispheric subdural hematoma which measures up to 8 mm in thickness. 2. Acute and chronic ischemia, reference preceding brain MRI. Electronically Signed   By: Monte Fantasia M.D.   On: 12/21/2020 05:01   CT Angio Neck W and/or Wo Contrast  Result Date: 12/20/2020 CLINICAL DATA:  Neuro deficit, acute, stroke suspected; history of intracerebral aneurysm, headache for several days, multiple falls today, pain in neck, rule out bleed or aneurysmal change. EXAM: CT ANGIOGRAPHY HEAD AND NECK TECHNIQUE: Multidetector CT imaging of the head and neck was performed using the standard protocol during bolus administration of intravenous contrast. Multiplanar CT image reconstructions and MIPs were obtained to evaluate the vascular anatomy. Carotid stenosis measurements (when applicable) are obtained utilizing NASCET criteria, using the distal internal carotid diameter as the denominator. CONTRAST:  74mL OMNIPAQUE IOHEXOL  350 MG/ML SOLN COMPARISON:  Brain MRI 11/25/2019. CT angiogram head/neck 11/25/2019. FINDINGS: CT HEAD FINDINGS Brain: Mild cerebral and cerebellar atrophy. Acute parafalcine subdural hematoma measuring up to 10 mm in greatest thickness (series 5, image 11). Unchanged size of a thin chronic subdural hematoma overlying the left cerebral hemisphere, measuring up to 4 mm, deep to the left-sided cranioplasty. Subacute infarct within the left thalamus. Additional known small subacute infarcts within the supratentorial and infratentorial brain were better appreciated on the prior brain MRI of 11/25/2019. 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 Advanced patchy and ill-defined hypoattenuation within the cerebral white matter, nonspecific but compatible with  chronic small vessel ischemic disease. No acute demarcated cortical infarct. No evidence of intracranial mass. No midline shift. Vascular: No hyperdense vessel.  Atherosclerotic calcifications Skull: No calvarial fracture.  Left-sided cranioplasty. Sinuses: Scattered trace mucosal thickening and fluid within the bilateral ethmoid air cells. Small right maxillary sinus mucous retention cyst. Orbits: No mass or acute finding. Review of the MIP images confirms the above findings CTA NECK FINDINGS Aortic arch: Standard aortic branching. Atherosclerotic plaque within the visualized aortic arch and proximal major branch vessels of the neck. No hemodynamically significant innominate or proximal subclavian artery stenosis. Right carotid system: CCA and ICA patent within the neck without significant stenosis (50% or greater). Mild calcified plaque within the carotid bifurcation and proximal ICA. Left carotid system: CCA and ICA patent within the neck without significant stenosis (50% or greater). Mild soft and calcified plaque within the carotid bifurcation and proximal ICA. Vertebral arteries: Codominant patent within the neck without stenosis. Mild nonstenotic calcified plaque at the origin of the right vertebral artery. Skeleton: No acute bony abnormality or aggressive osseous lesion. Cervical spondylosis. Cervical levocurvature with partially imaged thoracic dextrocurvature. Other neck: Neck mass or cervical lymphadenopathy. Upper chest: Consolidation within portions of the right upper lobe and within imaged portions of the superior right lower lobe compatible with pneumonia. A small ill-defined opacity also present within the left lung apex likely reflecting an additional site of pneumonia. Prior median sternotomy. Review of the MIP images confirms the above findings CTA HEAD FINDINGS Anterior circulation: The intracranial internal carotid arteries are patent. Plaque within both vessels without stenosis. Unchanged 2 mm  inferomedially projecting aneurysm arising from the supraclinoid left ICA (series 12, image 122). The M1 middle cerebral arteries are patent. No M2 proximal branch occlusion or high-grade proximal stenosis is identified. The anterior cerebral arteries are patent. Posterior circulation: The intracranial vertebral arteries are patent. The basilar artery is patent. The posterior cerebral arteries are patent. Posterior communicating arteries are hypoplastic or absent bilaterally. Venous sinuses: Within the limitations of contrast timing, no convincing thrombus. Anatomic variants: As described Review of the MIP images confirms the above findings Acute CT head findings called by telephone at the time of interpretation on 12/20/2020 at 7:55 pm to provider [facility] , who verbally acknowledged these results. IMPRESSION: CT head: 1. Acute parafalcine subdural hematoma measuring up to 10 mm in greatest thickness. 2. Unchanged thin chronic subdural hematoma overlying the left cerebral hemisphere, deep to the left-sided cranioplasty (4 mm in thickness). 3. Subacute infarct within the left thalamus. Additional known small subacute infarcts within the supratentorial and infratentorial brain were better appreciated on the prior brain MRI of 11/25/2019. 4. 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 5. Stable parenchymal atrophy and severe cerebral white matter chronic small vessel ischemic disease. 6. Mild paranasal sinus disease, as described. CTA neck: 1. The common carotid,  internal carotid and vertebral arteries are patent within the neck without hemodynamically significant stenosis. Atherosclerotic disease, as described. 2. Findings compatible right upper lobe, right lower lobe and left upper lobe pneumonia. Right lower lobe pneumonia is incompletely imaged. CTA head: 1. No intracranial large vessel occlusion or proximal high-grade arterial stenosis. 2. 2 mm aneurysm arising from the  supraclinoid left ICA, unchanged from the CTA of 11/25/2019. Electronically Signed   By: Kellie Simmering DO   On: 12/20/2020 19:57   MR BRAIN WO CONTRAST  Result Date: 12/21/2020 CLINICAL DATA:  Stroke symptoms and parafalcine subdural hematoma EXAM: MRI HEAD WITHOUT CONTRAST TECHNIQUE: Multiplanar, multiecho pulse sequences of the brain and surrounding structures were obtained without intravenous contrast. COMPARISON:  Head CT 12/20/2020 FINDINGS: Brain: There is multifocal acute ischemia throughout both cerebral and cerebellar hemispheres. The pattern is most suggestive watershed infarcts. The subdural hematoma along the falx cerebri is unchanged. Hyperintense T2-weighted signal is moderately widespread throughout the white matter. Generalized volume loss without a clear lobar predilection. The midline structures are normal. Vascular: Major flow voids are preserved. Skull and upper cervical spine: Remote left pterional craniotomy. Sinuses/Orbits:No paranasal sinus fluid levels or advanced mucosal thickening. No mastoid or middle ear effusion. Normal orbits. IMPRESSION: 1. Multifocal acute ischemia throughout both cerebral and cerebellar hemispheres, in a pattern most suggestive of watershed infarcts. 2. Unchanged subdural hematoma along the falx cerebri. Electronically Signed   By: Ulyses Jarred M.D.   On: 12/21/2020 00:55   CT CHEST ABDOMEN PELVIS W CONTRAST  Result Date: 12/21/2020 CLINICAL DATA:  Multiple falls yesterday EXAM: CT CHEST, ABDOMEN, AND PELVIS WITH CONTRAST TECHNIQUE: Multidetector CT imaging of the chest, abdomen and pelvis was performed following the standard protocol during bolus administration of intravenous contrast. CONTRAST:  161mL OMNIPAQUE IOHEXOL 300 MG/ML  SOLN COMPARISON:  12/20/2020, 02/22/2020 FINDINGS: CT CHEST FINDINGS Cardiovascular: The heart and great vessels are unremarkable with no pericardial effusion. Dense calcification of the mitral annulus. Extensive atherosclerosis of  the native coronary vessels, with evidence of prior CABG. No evidence of thoracic aortic aneurysm or dissection. Moderate atherosclerosis of the aortic arch. Mediastinum/Nodes: No enlarged mediastinal, hilar, or axillary lymph nodes. Thyroid gland, trachea, and esophagus demonstrate no significant findings. Lungs/Pleura: There is a 6 mm right upper lobe pulmonary nodule reference image 29/6, and a 5 mm right middle lobe pulmonary nodule reference image 39/6. These are unchanged since 2020 and can be considered benign. There is dense consolidation within the right upper lobe and superior segment right lower lobe with air bronchograms, compatible with pneumonia. Nodular area of consolidation within the superior segment left lower lobe image 30/6 measures 9 mm, and may reflect an additional focus of infection though follow-up CT will be needed to assess resolution. No effusion or pneumothorax.  The central airways are patent. Musculoskeletal: There are no acute or destructive bony lesions. Reconstructed images demonstrate no additional findings. CT ABDOMEN PELVIS FINDINGS Hepatobiliary: High attenuation material within the gallbladder likely reflects vicarious excretion of previously administered contrast. No evidence of cholelithiasis or cholecystitis. Liver is unremarkable. Pancreas: Unremarkable. No pancreatic ductal dilatation or surrounding inflammatory changes. Spleen: Borderline splenomegaly measuring 12.1 cm in anterior-posterior dimension, stable. No focal abnormalities. Adrenals/Urinary Tract: Stable hypodensities right kidney compatible with small cysts. No urinary tract calculi or obstructive uropathy. Excreted contrast is seen within the urinary bladder, with no filling defects. The adrenals are unremarkable. Stomach/Bowel: No bowel obstruction or ileus. Diffuse diverticulosis of the distal colon without diverticulitis. No bowel wall thickening or inflammatory  change. Vascular/Lymphatic: Aortic  atherosclerosis. No enlarged abdominal or pelvic lymph nodes. Reproductive: Status post hysterectomy. No adnexal masses. Other: No free fluid or free gas.  No abdominal wall hernia. Musculoskeletal: No acute or destructive bony lesions. Reconstructed images demonstrate no additional findings. IMPRESSION: 1. No acute intrathoracic, intra-abdominal, or intrapelvic trauma. 2. Multifocal pneumonia, most pronounced in the right upper and right lower lobes. 3. Nodular consolidation within the superior segment left lower lobe measuring 9 mm, favor infection. CT follow-up will be needed after appropriate medical management to document resolution. 4. Benign subcentimeter nodules within the right upper and right middle lobes. 5. Borderline splenomegaly. 6.  Aortic Atherosclerosis (ICD10-I70.0). Electronically Signed   By: Randa Ngo M.D.   On: 12/21/2020 15:34   DG Knee Complete 4 Views Left  Result Date: 12/20/2020 CLINICAL DATA:  Pain following fall EXAM: LEFT KNEE - COMPLETE 4+ VIEW COMPARISON:  None. FINDINGS: Frontal, lateral, and bilateral oblique views were obtained. There is no fracture or dislocation. No joint effusion. There is moderate narrowing medially and in the patellofemoral joint region. There is spurring in all compartments. No erosive changes. There are foci of arterial vascular calcification. 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 No fracture, dislocation, or effusion. Atherosclerotic arterial vascular calcifications noted. Electronically Signed   By: Lowella Grip III M.D.   On: 12/20/2020 16:28   DG Knee Complete 4 Views Right  Result Date: 12/20/2020 CLINICAL DATA:  Pain following fall EXAM: RIGHT KNEE - COMPLETE 4+ VIEW COMPARISON:  None. FINDINGS: Frontal, lateral, and bilateral oblique views were obtained. No appreciable fracture or dislocation. No joint effusion. There is moderate narrowing of the patellofemoral joint. There is mild  narrowing medially. There is spurring in all compartments. No erosive change. There are multiple foci of arterial vascular calcification. Surgical clips are noted medial to the proximal tibia. IMPRESSION: Osteoarthritic change early, primarily in the patellofemoral joint and medial compartments. No fracture, dislocation, or effusion. Foci of atherosclerotic arterial vascular calcification noted. Electronically Signed   By: Lowella Grip III M.D.   On: 12/20/2020 16:29   ECHOCARDIOGRAM COMPLETE BUBBLE STUDY  Result Date: 12/21/2020    ECHOCARDIOGRAM REPORT   Patient Name:   Yolanda Flowers Date of Exam: 12/21/2020 Medical Rec #:  [id number]      Height:       66.0 in Accession #:    [id number]     Weight:       170.0 lb Date of Birth:  July 24, 1941     BSA:          1.866 m Patient Age:    57 years       BP:           125/73 mmHg Patient Gender: F              HR:           71 bpm. Exam Location:  Inpatient Procedure: 2D Echo, Cardiac Doppler, Color Doppler and Saline Contrast Bubble            Study Indications:    CVA  History:        Patient has prior history of Echocardiogram examinations, most                 recent 11/25/2019. CAD, Stroke and COPD, Signs/Symptoms:Shortness                 of Breath; Risk Factors:Hypertension and Dyslipidemia.  Sonographer:  Winters Referring Phys: [id number] [place]  1. Agitated saline contrast bubble study was negative, with no evidence of any interatrial shunt.  2. Left ventricular ejection fraction, by estimation, is 65 to 70%. The left ventricle has normal function. The left ventricle has no regional wall motion abnormalities. Left ventricular diastolic parameters are consistent with Grade I diastolic dysfunction (impaired relaxation).  3. Right ventricular systolic function is normal. The right ventricular size is normal. There is normal pulmonary artery systolic pressure. The estimated right ventricular systolic pressure is [id number] mmHg.  4.  Left atrial size was mildly dilated.  5. The mitral valve is abnormal. Trivial mitral valve regurgitation. The mean mitral valve gradient is 3.0 mmHg with average heart rate of 72 bpm. Moderate mitral annular calcification. Calcified papillary muscle noted, not as well seen in prior study.  6. The aortic valve is tricuspid. Aortic valve regurgitation is not visualized. Aortic valve sclerosis is present, with no evidence of aortic valve stenosis. Comparison(s): A prior study was performed on 11/25/19. No significant change from prior study. Prior images reviewed side by side. FINDINGS  Left Ventricle: Left ventricular ejection fraction, by estimation, is 65 to 70%. The left ventricle has normal function. The left ventricle has no regional wall motion abnormalities. The left ventricular internal cavity size was normal in size. There is  no left ventricular hypertrophy of the basal-septal segment. Left ventricular diastolic parameters are consistent with Grade I diastolic dysfunction (impaired relaxation). Right Ventricle: The right ventricular size is normal. No increase in right ventricular wall thickness. Right ventricular systolic function is normal. There is normal pulmonary artery systolic pressure. The tricuspid regurgitant velocity is 2.84 m/s, and  with an assumed right atrial pressure of 3 mmHg, the estimated right ventricular systolic pressure is [id number] mmHg. Left Atrium: Left atrial size was mildly dilated. Right Atrium: Right atrial size was normal in size. Pericardium: Trivial pericardial effusion is present. Mitral Valve: Calcified papillary muscle noted. The mitral valve is abnormal. There is moderate calcification of the mitral valve leaflet(s). Moderate mitral annular calcification. Trivial mitral valve regurgitation. MV peak gradient, 7.8 mmHg. The mean mitral valve gradient is 3.0 mmHg with average heart rate of 72 bpm. Tricuspid Valve: The tricuspid valve is grossly normal. Tricuspid valve  regurgitation is mild. Aortic Valve: The aortic valve is tricuspid. Aortic valve regurgitation is not visualized. Mild aortic valve sclerosis is present, with no evidence of aortic valve stenosis. Aortic valve mean gradient measures 7.0 mmHg. Aortic valve peak gradient measures 14.2 mmHg. Aortic valve area, by VTI measures 2.04 cm. Pulmonic Valve: The pulmonic valve was grossly normal. Pulmonic valve regurgitation is not visualized. No evidence of pulmonic stenosis. Aorta: The aortic root and ascending aorta are structurally normal, with no evidence of dilitation. IAS/Shunts: The atrial septum is grossly normal. Agitated saline contrast was given intravenously to evaluate for intracardiac shunting. Agitated saline contrast bubble study was negative, with no evidence of any interatrial shunt.  LEFT VENTRICLE PLAX 2D LVIDd:         5.00 cm     Diastology LVIDs:         1.70 cm     LV e' medial:    4.79 cm/s LV PW:         0.80 cm     LV E/e' medial:  15.3 LV IVS:        0.90 cm     LV e' lateral:   7.94 cm/s LVOT diam:  1.80 cm     LV E/e' lateral: 9.2 LV SV:         72 LV SV Index:   38 LVOT Area:     2.54 cm  LV Volumes (MOD) LV vol d, MOD A2C: 29.3 ml LV vol d, MOD A4C: 36.6 ml LV vol s, MOD A2C: 8.6 ml LV vol s, MOD A4C: 15.8 ml LV SV MOD A2C:     20.7 ml LV SV MOD A4C:     36.6 ml LV SV MOD BP:      20.5 ml RIGHT VENTRICLE RV S prime:     16.00 cm/s  PULMONARY VEINS TAPSE (M-mode): 1.8 cm      A Reversal Duration: 116.00 msec                             A Reversal Velocity: 33.20 cm/s                             Diastolic Velocity:  0000000 cm/s                             S/D Velocity:        1.50                             Systolic Velocity:   123XX123 cm/s LEFT ATRIUM             Index       RIGHT ATRIUM           Index LA diam:        3.70 cm 1.98 cm/m  RA Area:     16.10 cm LA Vol (A2C):   77.2 ml 41.36 ml/m RA Volume:   38.80 ml  20.79 ml/m LA Vol (A4C):   48.1 ml 25.77 ml/m LA Biplane Vol: 65.3 ml  34.99 ml/m  AORTIC VALVE                    PULMONIC VALVE AV Area (Vmax):    2.24 cm     PV Vmax:       0.88 m/s AV Area (Vmean):   2.19 cm     PV Vmean:      64.500 cm/s AV Area (VTI):     2.04 cm     PV VTI:        0.179 m AV Vmax:           188.50 cm/s  PV Peak grad:  3.1 mmHg AV Vmean:          119.500 cm/s PV Mean grad:  2.0 mmHg AV VTI:            0.351 m AV Peak Grad:      14.2 mmHg AV Mean Grad:      7.0 mmHg LVOT Vmax:         166.00 cm/s LVOT Vmean:        103.000 cm/s LVOT VTI:          0.281 m LVOT/AV VTI ratio: 0.80  AORTA Ao Root diam: 3.50 cm Ao Asc diam:  3.20 cm MITRAL VALVE                TRICUSPID VALVE MV Area (PHT): 1.62 cm  TR Peak grad:   32.3 mmHg MV Area VTI:   2.03 cm     TR Vmax:        284.00 cm/s MV Peak grad:  7.8 mmHg MV Mean grad:  3.0 mmHg     SHUNTS MV Vmax:       1.40 m/s     Systemic VTI:  0.28 m MV Vmean:      73.7 cm/s    Systemic Diam: 1.80 cm MV Decel Time: 468 msec MR Peak grad: 43.8 mmHg MR Vmax:      331.00 cm/s MV E velocity: 73.30 cm/s MV A velocity: 127.00 cm/s MV E/A ratio:  0.58 Rudean Haskell MD Electronically signed by Rudean Haskell MD Signature Date/Time: 12/21/2020/11:24:53 AM    Final     ***  Lavon Paganini Kenzly Rogoff, PA-C 12/22/2020

## 2020-12-22 NOTE — Evaluation (Signed)
Speech Language Pathology Evaluation Patient Details Name: Yolanda Flowers MRN: [id number] DOB: [birth year] Today's Date: 12/22/2020 Time: 6812-7517 SLP Time Calculation (min) (ACUTE ONLY): 19.6 min  Problem List:  Patient Active Problem List   Diagnosis Date Noted  . Fall at home, initial encounter 12/21/2020  . Stroke ([place]) 12/21/2020  . Pneumonia of both lungs due to infectious organism 12/20/2020  . Subdural hematoma ([place]) 12/20/2020  . Anxiety   . Arthritis   . Asthma   . Bilateral edema of lower extremity   . Complication of anesthesia   . Congestive heart failure (CHF) ([place])   . Coronary artery disease involving native heart without angina pectoris   . Dyspnea on exertion   . History of melanoma excision   . History of squamous cell carcinoma excision   . History of subdural hematoma   . Hypertension   . Thrombocytosis   . OSA (obstructive sleep apnea)   . Wears dentures   . Wears glasses   . Acute blood loss anemia 12/14/2019  . Leukocytosis 12/14/2019  . Acute cardioembolic stroke ([place]) 00/17/4944  . Cellulitis of right leg   . Abdominal pain   . Stroke due to embolism ([place]) 11/25/2019  . S/P CABG x 4 11/04/2019  . CAD (coronary artery disease) 10/25/2019  . Accelerating angina ([place]) 08/26/2019  . Vitamin D deficiency 08/24/2019  . Mixed hyperlipidemia 08/24/2019  . Body mass index 29.0-29.9, adult 08/24/2019  . Vitamin B 12 deficiency 08/24/2019  . Malignant melanoma of great toe ([place]) 08/24/2019  . Pedal edema 08/24/2019  . Skin cancer 08/24/2019  . Squamous cell carcinoma, scalp/neck 08/24/2019  . Ischial bursitis, right 08/24/2019  . Swelling of limb 08/24/2019  . Renal insufficiency 08/24/2019  . Atherosclerosis of arteries 08/24/2019  . Dyspnea 02/09/2019  . Solitary pulmonary nodule on lung CT 02/09/2019  . Gastrointestinal bleeding 06/23/2018  . VIN III (vulvar intraepithelial neoplasia III) 10/09/2015  . Melanoma in situ ([place]) 06/29/2014  .  Essential thrombocythemia ([place]) 06/22/2014  . Essential thrombocytosis ([place]) 06/22/2014  . COPD (chronic obstructive pulmonary disease) ([place]) 06/21/2014  . HTN (hypertension) 06/21/2014  . Allergic rhinitis 06/21/2014  . GERD without esophagitis 06/21/2014   Past Medical History:  Past Medical History:  Diagnosis Date  . Abdominal pain   . Accelerating angina ([place]) 08/26/2019  . Acute blood loss anemia 12/14/2019  . Allergic rhinitis 06/21/2014  . Anxiety   . Arthritis   . Asthma   . Atherosclerosis of arteries 08/24/2019  . Bilateral edema of lower extremity   . Body mass index 29.0-29.9, adult 08/24/2019  . CAD (coronary artery disease) 10/25/2019  . Cellulitis of right leg   . Combined hyperlipidemia 08/24/2019  . Complication of anesthesia    difficulty waking  . Congestive heart failure (CHF) ([place])   . COPD (chronic obstructive pulmonary disease) ([place])   . Coronary artery disease   . Dyspnea 02/09/2019  . Dyspnea on exertion   . Embolic stroke ([place]) 9/67/5916  . Essential thrombocythemia ([place]) 06/22/2014  . Essential thrombocytosis ([place]) 06/22/2014  . Gastrointestinal bleeding 06/23/2018  . GERD (gastroesophageal reflux disease)   . History of melanoma excision    left greast toe 2015  . History of squamous cell carcinoma excision    face--  multiple excisions  . History of subdural hematoma    2008  . HTN (hypertension) 06/21/2014  . Hypertension   . Ischial bursitis, right 08/24/2019  . Leucocytosis 12/14/2019  . Malignant melanoma of great toe (  [place]) 08/24/2019  . Melanoma in situ ([place]) 06/29/2014  . OSA (obstructive sleep apnea)    intolerant  . Pedal edema 08/24/2019  . Renal insufficiency 08/24/2019  . S/P CABG x 4 11/04/2019  . Skin cancer 08/24/2019  . Solitary pulmonary nodule on lung CT 02/09/2019  . Squamous cell carcinoma, scalp/neck 08/24/2019  . Stroke due to embolism ([place]) 11/25/2019  . Swelling of limb 08/24/2019  . Thrombocytosis    takes hydoxyurea--  MONITORED BY DR  Hinton Rao [facility])  . VIN III (vulvar intraepithelial neoplasia III)   . Vitamin B 12 deficiency 08/24/2019  . Vitamin D deficiency 08/24/2019  . Wears dentures    UPPER AND LOWER PARTIAL  . Wears glasses    Past Surgical History:  Past Surgical History:  Procedure Laterality Date  . ABDOMINAL HYSTERECTOMY  age 40  . CARDIAC CATHETERIZATION  03/172021  . CORONARY ARTERY BYPASS GRAFT N/A 11/04/2019   Procedure: CORONARY ARTERY BYPASS GRAFTING (CABG) x 4, with ENDOSCOPIC HARVESTING OF RIGHT GREATER SAPHENOUS VEIN.;  Surgeon: Gaye Pollack, MD;  Location: [place] OR;  Service: Open Heart Surgery;  Laterality: N/A;  . INTRAVASCULAR ULTRASOUND/IVUS N/A 11/03/2019   Procedure: Intravascular Ultrasound/IVUS;  Surgeon: Nelva Bush, MD;  Location: [place] CV LAB;  Service: Cardiovascular;  Laterality: N/A;  . KNEE ARTHROSCOPY Left 2004  . LEFT HEART CATH AND CORONARY ANGIOGRAPHY N/A 11/03/2019   Procedure: LEFT HEART CATH AND CORONARY ANGIOGRAPHY;  Surgeon: Nelva Bush, MD;  Location: [place] CV LAB;  Service: Cardiovascular;  Laterality: N/A;  . LOOP RECORDER INSERTION N/A 11/30/2019   Procedure: LOOP RECORDER INSERTION;  Surgeon: Thompson Grayer, MD;  Location: [place] CV LAB;  Service: Cardiovascular;  Laterality: N/A;  . MELANOMA EXCISION  2015   left great toe  . REPAIR PERONEAL TENDONS ANKLE  2004  . SUBDURAL HEMATOMA EVACUATION VIA CRANIOTOMY  2008      week later  post-op  [facility]  . TEE WITHOUT CARDIOVERSION N/A 11/04/2019   Procedure: TRANSESOPHAGEAL ECHOCARDIOGRAM (TEE);  Surgeon: Gaye Pollack, MD;  Location: [place];  Service: Open Heart Surgery;  Laterality: N/A;  . VULVECTOMY N/A 10/24/2015   Procedure: WIDE LOCAL EXCISION OF THE VULVA ;  Surgeon: Everitt Amber, MD;  Location: [place];  Service: Gynecology;  Laterality: N/A;   HPI:  Pt is an 80 y/o female with PMH CVA, COPD, and CAD s/p CABG who was admitted secondary to multiple  falls. CT showed Acute parafalcine subdural hematoma and MRI showed Multifocal acute ischemia throughout both cerebral and cerebellar  hemispheres, in a pattern most suggestive of watershed infarcts. No surgery indicated per neurosurgery.   Assessment / Plan / Recommendation Clinical Impression  Pt participated in speech/language evaluation. Pt reported that she has had difficulty with memory since the prior CVA, but that she has still been able to live independently and manage her medications and finances without difficulty. She denied any acute changes in speech, language, or cognition. The [facility] Mental Status Examination was completed to evaluate the pt's cognitive-linguistic skills. She achieved a score of 26/30 which is slightly below the normal limits of 27 or more out of 30 and is suggestive of a mild impairment. Points were lost during divergent naming and paragraph recall. Information assessment revealed functional cognitive-linguistic skills and pt reported that she believes her cognition is currently at baseline. Further acute skilled SLP services are not clinically indicated at this time. Pt was educated regarding results  and recommendations; she verbalized understanding as well as agreement with plan of care.    SLP Assessment  SLP Recommendation/Assessment: Patient does not need any further Speech Lanaguage Pathology Services SLP Visit Diagnosis: Cognitive communication deficit (R41.841)    Follow Up Recommendations  None    Frequency and Duration           SLP Evaluation Cognition  Overall Cognitive Status: History of cognitive impairments - at baseline Arousal/Alertness: Awake/alert Orientation Level: Oriented X4 Attention: Focused;Sustained Focused Attention: Appears intact Sustained Attention: Appears intact Memory: Appears intact (Immediate: 5/5; delayed: 5/5) Awareness: Appears intact Problem Solving: Appears intact (Safety: 4/4) Executive Function:  Reasoning;Sequencing;Organizing Reasoning: Appears intact Sequencing: Impaired Sequencing Impairment: Verbal complex (Clock drawing 4/4 with self correction) Organizing: Appears intact (Backward digit span up to 4: 3/3)       Comprehension  Auditory Comprehension Overall Auditory Comprehension: Appears within functional limits for tasks assessed Yes/No Questions: Within Functional Limits Commands: Within Functional Limits Conversation: Complex    Expression Expression Primary Mode of Expression: Verbal Verbal Expression Overall Verbal Expression: Appears within functional limits for tasks assessed Initiation: No impairment Level of Generative/Spontaneous Verbalization: Conversation Naming: No impairment Pragmatics: No impairment   Oral / Motor  Oral Motor/Sensory Function Overall Oral Motor/Sensory Function: Within functional limits Motor Speech Overall Motor Speech: Appears within functional limits for tasks assessed Respiration: Within functional limits Phonation: Normal Resonance: Within functional limits Articulation: Within functional limitis Intelligibility: Intelligible Motor Planning: Witnin functional limits Motor Speech Errors: Not applicable   Ichelle Harral I. Hardin Negus, [place], [place] Office number [phone] Pager [phone]                     Horton Marshall 12/22/2020, 12:09 PM

## 2020-12-22 NOTE — Progress Notes (Signed)
Bilateral lower extremity venous duplex has been completed. Preliminary results can be found in CV Proc through chart review.   12/22/20 1:35 PM Yolanda Flowers RVT

## 2020-12-22 NOTE — Discharge Summary (Incomplete)
Physician Discharge Summary  Yolanda Flowers OBS:[id number] DOB: Sep 06, 1940 DOA: 12/20/2020  PCP: [name], MD  Admit date: 12/20/2020 Discharge date: 12/22/2020  Admitted From: Home Disposition: CIR  Recommendations for Outpatient Follow-up:  1. Follow up with CIR provider at earliest convenience 2. Outpatient follow-up with neurology and neurosurgery 3. Outpatient follow-up with hematology/oncology for bone marrow biopsy which has been planned as an outpatient 4. Follow up in ED if symptoms worsen or new appear   Home Health: No Equipment/Devices: None  Discharge Condition: Guarded to poor CODE STATUS: DNR Diet recommendation: Heart healthy/diet as per SLP recommendations  Brief/Interim Summary: 80 year old female with history of essential thrombocytosis currently no longer on hydroxyurea, chronic leukocytosis, chronic subdural hematoma (left-sided since 2008), melanoma, vulvar intraepithelial neoplasia, GERD, CAD status post CABG in 3/21, hypertension, COPD, hyperlipidemia and ischemic stroke (thought to be cardioembolic but source never identified-in 11/2019) presented with fall.  On presentation, CT of the brain showed subdural hematoma measuring 10 mm in thickness.  Neurosurgery recommended conservative management and repeat CT of the brain in the morning.  She was also found to have evidence of multilobar pneumonia.  COVID-19 test was negative.  She was started on IV antibiotics.  She was found to have scattered embolic lacunar infarcts on MRI of the brain.  Neurology was consulted.  Repeat CT of the brain was negative for worsening of the subdural bleeding.  Neurosurgery signed off.  She is not a candidate for any antiplatelets or anticoagulation because of subdural hematoma and neurology recommended outpatient follow-up.  PT recommended CIR placement.  She will be discharged to CIR once bed is available.  Discharge Diagnoses:   Subdural hematoma Fall at home -CT on presentation  showed a small acute parafalcine subdural hematoma.  This is superimposed on patient's longstanding history of chronic subdural hematoma since 2008. -Repeat CT of the brain did not show worsening of subdural hematoma.  Neurosurgery recommended conservative management and signed off.  Outpatient follow-up with neurosurgery if needed  Acute cardioembolic stroke -MRI of the brain showed scattered embolic lacunar infarcts -Neurology evaluated the patient: She is not a candidate for anticoagulation or antiplatelets because of subdural hematoma.  Dose of Lipitor has been decreased to 40 mg daily by neurology. -CTA head and neck showed no significant stenosis -2D echo with bubble showed EF of 65 to 70%, left atrium mildly dilated -CT chest/abdomen and pelvis was ordered by neurology to rule out malignancy: There was no evidence of malignancy on the CT -LDL 25.  A1c 5.8 -PT recommended CIR placement.  Discharge to CIR once bed is available.  Diet as per SLP recommendations  Community-acquired multilobar bacterial pneumonia -COVID-19 testing was negative on presentation.  Currently on Rocephin and Zithromax.  Discharge on Ceftin for 5 days.  Currently on room air  ?  MDS Leukocytosis Thrombocytopenia Anemia of chronic disease: Possibly from above and other chronic illnesses -History of thrombocytosis with recent chronic thrombocytopenia and leukocytosis with concern for MDS.  Bone marrow biopsy planned as an outpatient soon. -Hemoglobin stable currently  CAD -Chest pain-free.  Continue statin.  Cannot continue antiplatelet because of subdural hematoma.  Outpatient follow-up with cardiology  GERD -Continue PPI  Hyperlipidemia -Statin plan as above  Discharge Instructions   Allergies as of 12/22/2020      Reactions   Cephalexin Hives   Penicillin G    Ciprofloxacin Other (See Comments)   Gi intolerance   Penicillins Rash   Childhood reaction Did it involve swelling of the  face/tongue/throat, SOB, or low BP? No Did it involve sudden or severe rash/hives, skin peeling, or any reaction on the inside of your mouth or nose? No Did you need to seek medical attention at a hospital or doctor's office? No When did it last happen?50 years ago If all above answers are "NO", may proceed with cephalosporin use.    Med Rec must be completed prior to using this [place]***       Follow-up Information    [name], MD. Schedule an appointment as soon as possible for a visit in 1 week(s).   Specialty: Internal Medicine Contact information: [address] [place] [address] [phone]        Revankar, [name], MD .   Specialty: Cardiology Contact information: [address] [place] [address] [phone]        Eustace Moore, MD. Schedule an appointment as soon as possible for a visit in 1 week(s).   Specialty: Neurosurgery Contact information: [address] [place] [address] [phone]              Allergies  Allergen Reactions  . Cephalexin Hives  . Penicillin G   . Ciprofloxacin Other (See Comments)    Gi intolerance  . Penicillins Rash    Childhood reaction Did it involve swelling of the face/tongue/throat, SOB, or low BP? No Did it involve sudden or severe rash/hives, skin peeling, or any reaction on the inside of your mouth or nose? No Did you need to seek medical attention at a hospital or doctor's office? No When did it last happen?50 years ago If all above answers are "NO", may proceed with cephalosporin use.     Consultations:  Neurosurgery/neurology   Procedures/Studies: CT Angio Head W or Wo Contrast  Result Date: 12/20/2020 CLINICAL DATA:  Neuro deficit, acute, stroke suspected; history of intracerebral aneurysm, headache for several days, multiple falls today, pain in neck, rule out bleed or aneurysmal change. EXAM: CT ANGIOGRAPHY HEAD AND NECK  TECHNIQUE: Multidetector CT imaging of the head and neck was performed using the standard protocol during bolus administration of intravenous contrast. Multiplanar CT image reconstructions and MIPs were obtained to evaluate the vascular anatomy. Carotid stenosis measurements (when applicable) are obtained utilizing NASCET criteria, using the distal internal carotid diameter as the denominator. CONTRAST:  45m OMNIPAQUE IOHEXOL 350 MG/ML SOLN COMPARISON:  Brain MRI 11/25/2019. CT angiogram head/neck 11/25/2019. FINDINGS: CT HEAD FINDINGS Brain: Mild cerebral and cerebellar atrophy. Acute parafalcine subdural hematoma measuring up to 10 mm in greatest thickness (series 5, image 11). Unchanged size of a thin chronic subdural hematoma overlying the left cerebral hemisphere, measuring up to 4 mm, deep to the left-sided cranioplasty. Subacute infarct within the left thalamus. Additional known small subacute infarcts within the supratentorial and infratentorial brain were better appreciated on the prior brain MRI of 11/25/2019. 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 Advanced patchy and ill-defined hypoattenuation within the cerebral white matter, nonspecific but compatible with chronic small vessel ischemic disease. No acute demarcated cortical infarct. No evidence of intracranial mass. No midline shift. Vascular: No hyperdense vessel.  Atherosclerotic calcifications Skull: No calvarial fracture.  Left-sided cranioplasty. Sinuses: Scattered trace mucosal thickening and fluid within the bilateral ethmoid air cells. Small right maxillary sinus mucous retention cyst. Orbits: No mass or acute finding. Review of the MIP images confirms the above findings CTA NECK FINDINGS Aortic arch: Standard aortic branching. Atherosclerotic plaque within the visualized aortic arch and  proximal major branch vessels of the neck. No hemodynamically significant innominate or proximal subclavian artery stenosis. Right  carotid system: CCA and ICA patent within the neck without significant stenosis (50% or greater). Mild calcified plaque within the carotid bifurcation and proximal ICA. Left carotid system: CCA and ICA patent within the neck without significant stenosis (50% or greater). Mild soft and calcified plaque within the carotid bifurcation and proximal ICA. Vertebral arteries: Codominant patent within the neck without stenosis. Mild nonstenotic calcified plaque at the origin of the right vertebral artery. Skeleton: No acute bony abnormality or aggressive osseous lesion. Cervical spondylosis. Cervical levocurvature with partially imaged thoracic dextrocurvature. Other neck: Neck mass or cervical lymphadenopathy. Upper chest: Consolidation within portions of the right upper lobe and within imaged portions of the superior right lower lobe compatible with pneumonia. A small ill-defined opacity also present within the left lung apex likely reflecting an additional site of pneumonia. Prior median sternotomy. Review of the MIP images confirms the above findings CTA HEAD FINDINGS Anterior circulation: The intracranial internal carotid arteries are patent. Plaque within both vessels without stenosis. Unchanged 2 mm inferomedially projecting aneurysm arising from the supraclinoid left ICA (series 12, image 122). The M1 middle cerebral arteries are patent. No M2 proximal branch occlusion or high-grade proximal stenosis is identified. The anterior cerebral arteries are patent. Posterior circulation: The intracranial vertebral arteries are patent. The basilar artery is patent. The posterior cerebral arteries are patent. Posterior communicating arteries are hypoplastic or absent bilaterally. Venous sinuses: Within the limitations of contrast timing, no convincing thrombus. Anatomic variants: As described Review of the MIP images confirms the above findings Acute CT head findings called by telephone at the time of interpretation on  12/20/2020 at 7:55 pm to provider [facility] , who verbally acknowledged these results. IMPRESSION: CT head: 1. Acute parafalcine subdural hematoma measuring up to 10 mm in greatest thickness. 2. Unchanged thin chronic subdural hematoma overlying the left cerebral hemisphere, deep to the left-sided cranioplasty (4 mm in thickness). 3. Subacute infarct within the left thalamus. Additional known small subacute infarcts within the supratentorial and infratentorial brain were better appreciated on the prior brain MRI of 11/25/2019. 4. 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 5. Stable parenchymal atrophy and severe cerebral white matter chronic small vessel ischemic disease. 6. Mild paranasal sinus disease, as described. CTA neck: 1. The common carotid, internal carotid and vertebral arteries are patent within the neck without hemodynamically significant stenosis. Atherosclerotic disease, as described. 2. Findings compatible right upper lobe, right lower lobe and left upper lobe pneumonia. Right lower lobe pneumonia is incompletely imaged. CTA head: 1. No intracranial large vessel occlusion or proximal high-grade arterial stenosis. 2. 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 Electronically Signed   By: Kellie Simmering DO   On: 12/20/2020 19:57   DG Chest 2 View  Result Date: 12/20/2020 CLINICAL DATA:  Dyspnea, fatigue, congestive heart failure EXAM: CHEST - 2 VIEW COMPARISON:  12/22/2019 FINDINGS: There is focal consolidation within the posterior right upper lobe, likely infectious in the acute setting. The chin overlies the lung apices, however, no definite pneumothorax. No pleural effusion. Cardiac size is mildly enlarged. Coronary artery bypass grafting has been performed. The pulmonary vascularity is normal. No acute bone abnormality. IMPRESSION: Right upper lobe consolidation in keeping with acute lobar pneumonia in the appropriate  clinical setting. Follow-up chest radiograph is recommended in 3-4 weeks to document resolution and exclude the presence of a  central obstructing lesion. Electronically Signed   By: Fidela Salisbury MD   On: 12/20/2020 16:32   CT HEAD WO CONTRAST  Result Date: 12/21/2020 CLINICAL DATA:  Subdural hemorrhage.  Stroke follow-up EXAM: CT HEAD WITHOUT CONTRAST TECHNIQUE: Contiguous axial images were obtained from the base of the skull through the vertex without intravenous contrast. COMPARISON:  Brain MRI from earlier today FINDINGS: Brain: High-density subdural hematoma along the falx measuring up to 8 mm in thickness, non progressed. No associated significant mass effect. Extensive acute and chronic ischemia as characterized by MRI earlier the same day. No hydrocephalus or shift. Vascular: Negative Skull: Unremarkable remote left craniotomy. Sinuses/Orbits: Bilateral cataract resection IMPRESSION: 1. No interval progression of the inter hemispheric subdural hematoma which measures up to 8 mm in thickness. 2. Acute and chronic ischemia, reference preceding brain MRI. Electronically Signed   By: Monte Fantasia M.D.   On: 12/21/2020 05:01   CT Angio Neck W and/or Wo Contrast  Result Date: 12/20/2020 CLINICAL DATA:  Neuro deficit, acute, stroke suspected; history of intracerebral aneurysm, headache for several days, multiple falls today, pain in neck, rule out bleed or aneurysmal change. EXAM: CT ANGIOGRAPHY HEAD AND NECK TECHNIQUE: Multidetector CT imaging of the head and neck was performed using the standard protocol during bolus administration of intravenous contrast. Multiplanar CT image reconstructions and MIPs were obtained to evaluate the vascular anatomy. Carotid stenosis measurements (when applicable) are obtained utilizing NASCET criteria, using the distal internal carotid diameter as the denominator. CONTRAST:  12m OMNIPAQUE IOHEXOL 350 MG/ML SOLN COMPARISON:  Brain MRI 11/25/2019. CT angiogram head/neck  11/25/2019. FINDINGS: CT HEAD FINDINGS Brain: Mild cerebral and cerebellar atrophy. Acute parafalcine subdural hematoma measuring up to 10 mm in greatest thickness (series 5, image 11). Unchanged size of a thin chronic subdural hematoma overlying the left cerebral hemisphere, measuring up to 4 mm, deep to the left-sided cranioplasty. Subacute infarct within the left thalamus. Additional known small subacute infarcts within the supratentorial and infratentorial brain were better appreciated on the prior brain MRI of 11/25/2019. 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 Advanced patchy and ill-defined hypoattenuation within the cerebral white matter, nonspecific but compatible with chronic small vessel ischemic disease. No acute demarcated cortical infarct. No evidence of intracranial mass. No midline shift. Vascular: No hyperdense vessel.  Atherosclerotic calcifications Skull: No calvarial fracture.  Left-sided cranioplasty. Sinuses: Scattered trace mucosal thickening and fluid within the bilateral ethmoid air cells. Small right maxillary sinus mucous retention cyst. Orbits: No mass or acute finding. Review of the MIP images confirms the above findings CTA NECK FINDINGS Aortic arch: Standard aortic branching. Atherosclerotic plaque within the visualized aortic arch and proximal major branch vessels of the neck. No hemodynamically significant innominate or proximal subclavian artery stenosis. Right carotid system: CCA and ICA patent within the neck without significant stenosis (50% or greater). Mild calcified plaque within the carotid bifurcation and proximal ICA. Left carotid system: CCA and ICA patent within the neck without significant stenosis (50% or greater). Mild soft and calcified plaque within the carotid bifurcation and proximal ICA. Vertebral arteries: Codominant patent within the neck without stenosis. Mild nonstenotic calcified plaque at the origin of the right vertebral artery.  Skeleton: No acute bony abnormality or aggressive osseous lesion. Cervical spondylosis. Cervical levocurvature with partially imaged thoracic dextrocurvature. Other neck: Neck mass or cervical lymphadenopathy. Upper chest: Consolidation within portions of the right upper lobe and within imaged portions of the superior right lower lobe compatible with pneumonia. A small ill-defined opacity  also present within the left lung apex likely reflecting an additional site of pneumonia. Prior median sternotomy. Review of the MIP images confirms the above findings CTA HEAD FINDINGS Anterior circulation: The intracranial internal carotid arteries are patent. Plaque within both vessels without stenosis. Unchanged 2 mm inferomedially projecting aneurysm arising from the supraclinoid left ICA (series 12, image 122). The M1 middle cerebral arteries are patent. No M2 proximal branch occlusion or high-grade proximal stenosis is identified. The anterior cerebral arteries are patent. Posterior circulation: The intracranial vertebral arteries are patent. The basilar artery is patent. The posterior cerebral arteries are patent. Posterior communicating arteries are hypoplastic or absent bilaterally. Venous sinuses: Within the limitations of contrast timing, no convincing thrombus. Anatomic variants: As described Review of the MIP images confirms the above findings Acute CT head findings called by telephone at the time of interpretation on 12/20/2020 at 7:55 pm to provider [facility] , who verbally acknowledged these results. IMPRESSION: CT head: 1. Acute parafalcine subdural hematoma measuring up to 10 mm in greatest thickness. 2. Unchanged thin chronic subdural hematoma overlying the left cerebral hemisphere, deep to the left-sided cranioplasty (4 mm in thickness). 3. Subacute infarct within the left thalamus. Additional known small subacute infarcts within the supratentorial and infratentorial brain were better appreciated on the  prior brain MRI of 11/25/2019. 4. 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 5. Stable parenchymal atrophy and severe cerebral white matter chronic small vessel ischemic disease. 6. Mild paranasal sinus disease, as described. CTA neck: 1. The common carotid, internal carotid and vertebral arteries are patent within the neck without hemodynamically significant stenosis. Atherosclerotic disease, as described. 2. Findings compatible right upper lobe, right lower lobe and left upper lobe pneumonia. Right lower lobe pneumonia is incompletely imaged. CTA head: 1. No intracranial large vessel occlusion or proximal high-grade arterial stenosis. 2. 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 Electronically Signed   By: Kellie Simmering DO   On: 12/20/2020 19:57   MR BRAIN WO CONTRAST  Result Date: 12/21/2020 CLINICAL DATA:  Stroke symptoms and parafalcine subdural hematoma EXAM: MRI HEAD WITHOUT CONTRAST TECHNIQUE: Multiplanar, multiecho pulse sequences of the brain and surrounding structures were obtained without intravenous contrast. COMPARISON:  Head CT 12/20/2020 FINDINGS: Brain: There is multifocal acute ischemia throughout both cerebral and cerebellar hemispheres. The pattern is most suggestive watershed infarcts. The subdural hematoma along the falx cerebri is unchanged. Hyperintense T2-weighted signal is moderately widespread throughout the white matter. Generalized volume loss without a clear lobar predilection. The midline structures are normal. Vascular: Major flow voids are preserved. Skull and upper cervical spine: Remote left pterional craniotomy. Sinuses/Orbits:No paranasal sinus fluid levels or advanced mucosal thickening. No mastoid or middle ear effusion. Normal orbits. IMPRESSION: 1. Multifocal acute ischemia throughout both cerebral and cerebellar hemispheres, in a pattern most suggestive of watershed infarcts. 2. Unchanged subdural  hematoma along the falx cerebri. Electronically Signed   By: Ulyses Jarred M.D.   On: 12/21/2020 00:55   CT CHEST ABDOMEN PELVIS W CONTRAST  Result Date: 12/21/2020 CLINICAL DATA:  Multiple falls yesterday EXAM: CT CHEST, ABDOMEN, AND PELVIS WITH CONTRAST TECHNIQUE: Multidetector CT imaging of the chest, abdomen and pelvis was performed following the standard protocol during bolus administration of intravenous contrast. CONTRAST:  147m OMNIPAQUE IOHEXOL 300 MG/ML  SOLN COMPARISON:  12/20/2020, 02/22/2020 FINDINGS: CT CHEST FINDINGS Cardiovascular: The heart and great vessels are unremarkable with no pericardial effusion. Dense calcification of the mitral annulus. Extensive  atherosclerosis of the native coronary vessels, with evidence of prior CABG. No evidence of thoracic aortic aneurysm or dissection. Moderate atherosclerosis of the aortic arch. Mediastinum/Nodes: No enlarged mediastinal, hilar, or axillary lymph nodes. Thyroid gland, trachea, and esophagus demonstrate no significant findings. Lungs/Pleura: There is a 6 mm right upper lobe pulmonary nodule reference image 29/6, and a 5 mm right middle lobe pulmonary nodule reference image 39/6. These are unchanged since 2020 and can be considered benign. There is dense consolidation within the right upper lobe and superior segment right lower lobe with air bronchograms, compatible with pneumonia. Nodular area of consolidation within the superior segment left lower lobe image 30/6 measures 9 mm, and may reflect an additional focus of infection though follow-up CT will be needed to assess resolution. No effusion or pneumothorax.  The central airways are patent. Musculoskeletal: There are no acute or destructive bony lesions. Reconstructed images demonstrate no additional findings. CT ABDOMEN PELVIS FINDINGS Hepatobiliary: High attenuation material within the gallbladder likely reflects vicarious excretion of previously administered contrast. No evidence of  cholelithiasis or cholecystitis. Liver is unremarkable. Pancreas: Unremarkable. No pancreatic ductal dilatation or surrounding inflammatory changes. Spleen: Borderline splenomegaly measuring 12.1 cm in anterior-posterior dimension, stable. No focal abnormalities. Adrenals/Urinary Tract: Stable hypodensities right kidney compatible with small cysts. No urinary tract calculi or obstructive uropathy. Excreted contrast is seen within the urinary bladder, with no filling defects. The adrenals are unremarkable. Stomach/Bowel: No bowel obstruction or ileus. Diffuse diverticulosis of the distal colon without diverticulitis. No bowel wall thickening or inflammatory change. Vascular/Lymphatic: Aortic atherosclerosis. No enlarged abdominal or pelvic lymph nodes. Reproductive: Status post hysterectomy. No adnexal masses. Other: No free fluid or free gas.  No abdominal wall hernia. Musculoskeletal: No acute or destructive bony lesions. Reconstructed images demonstrate no additional findings. IMPRESSION: 1. No acute intrathoracic, intra-abdominal, or intrapelvic trauma. 2. Multifocal pneumonia, most pronounced in the right upper and right lower lobes. 3. Nodular consolidation within the superior segment left lower lobe measuring 9 mm, favor infection. CT follow-up will be needed after appropriate medical management to document resolution. 4. Benign subcentimeter nodules within the right upper and right middle lobes. 5. Borderline splenomegaly. 6.  Aortic Atherosclerosis (ICD10-I70.0). Electronically Signed   By: Randa Ngo M.D.   On: 12/21/2020 15:34   DG Knee Complete 4 Views Left  Result Date: 12/20/2020 CLINICAL DATA:  Pain following fall EXAM: LEFT KNEE - COMPLETE 4+ VIEW COMPARISON:  None. FINDINGS: Frontal, lateral, and bilateral oblique views were obtained. There is no fracture or dislocation. No joint effusion. There is moderate narrowing medially and in the patellofemoral joint region. There is spurring in all  compartments. No erosive changes. There are foci of arterial vascular calcification. 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 No fracture, dislocation, or effusion. Atherosclerotic arterial vascular calcifications noted. Electronically Signed   By: Lowella Grip III M.D.   On: 12/20/2020 16:28   DG Knee Complete 4 Views Right  Result Date: 12/20/2020 CLINICAL DATA:  Pain following fall EXAM: RIGHT KNEE - COMPLETE 4+ VIEW COMPARISON:  None. FINDINGS: Frontal, lateral, and bilateral oblique views were obtained. No appreciable fracture or dislocation. No joint effusion. There is moderate narrowing of the patellofemoral joint. There is mild narrowing medially. There is spurring in all compartments. No erosive change. There are multiple foci of arterial vascular calcification. Surgical clips are noted medial to the proximal tibia. IMPRESSION: Osteoarthritic change early, primarily in the patellofemoral joint and medial compartments. No fracture, dislocation, or effusion. Foci  of atherosclerotic arterial vascular calcification noted. Electronically Signed   By: Lowella Grip III M.D.   On: 12/20/2020 16:29   CUP PACEART REMOTE DEVICE CHECK  Result Date: 12/12/2020 ILR summary report received. Battery status OK. Normal device function. No new symptom, tachy, brady, or pause episodes. No new AF episodes. Monthly summary reports and ROV/PRN  ECHOCARDIOGRAM COMPLETE BUBBLE STUDY  Result Date: 12/21/2020    ECHOCARDIOGRAM REPORT   Patient Name:   Yolanda Flowers Date of Exam: 12/21/2020 Medical Rec #:  [id number]      Height:       66.0 in Accession #:    [id number]     Weight:       170.0 lb Date of Birth:  [birth year]     BSA:          1.866 m Patient Age:    47 years       BP:           125/73 mmHg Patient Gender: F              HR:           71 bpm. Exam Location:  Inpatient Procedure: 2D Echo, Cardiac Doppler, Color Doppler and Saline Contrast Bubble             Study Indications:    CVA  History:        Patient has prior history of Echocardiogram examinations, most                 recent 11/25/2019. CAD, Stroke and COPD, Signs/Symptoms:Shortness                 of Breath; Risk Factors:Hypertension and Dyslipidemia.  Sonographer:    [place] Referring Phys: [id number] [place]  1. Agitated saline contrast bubble study was negative, with no evidence of any interatrial shunt.  2. Left ventricular ejection fraction, by estimation, is 65 to 70%. The left ventricle has normal function. The left ventricle has no regional wall motion abnormalities. Left ventricular diastolic parameters are consistent with Grade I diastolic dysfunction (impaired relaxation).  3. Right ventricular systolic function is normal. The right ventricular size is normal. There is normal pulmonary artery systolic pressure. The estimated right ventricular systolic pressure is 56.3 mmHg.  4. Left atrial size was mildly dilated.  5. The mitral valve is abnormal. Trivial mitral valve regurgitation. The mean mitral valve gradient is 3.0 mmHg with average heart rate of 72 bpm. Moderate mitral annular calcification. Calcified papillary muscle noted, not as well seen in prior study.  6. The aortic valve is tricuspid. Aortic valve regurgitation is not visualized. Aortic valve sclerosis is present, with no evidence of aortic valve stenosis. Comparison(s): A prior study was performed on 11/25/19. No significant change from prior study. Prior images reviewed side by side. FINDINGS  Left Ventricle: Left ventricular ejection fraction, by estimation, is 65 to 70%. The left ventricle has normal function. The left ventricle has no regional wall motion abnormalities. The left ventricular internal cavity size was normal in size. There is  no left ventricular hypertrophy of the basal-septal segment. Left ventricular diastolic parameters are consistent with Grade I diastolic dysfunction (impaired  relaxation). Right Ventricle: The right ventricular size is normal. No increase in right ventricular wall thickness. Right ventricular systolic function is normal. There is normal pulmonary artery systolic pressure. The tricuspid regurgitant velocity is 2.84 m/s, and  with an assumed right atrial pressure of 3 mmHg,  the estimated right ventricular systolic pressure is 60.7 mmHg. Left Atrium: Left atrial size was mildly dilated. Right Atrium: Right atrial size was normal in size. Pericardium: Trivial pericardial effusion is present. Mitral Valve: Calcified papillary muscle noted. The mitral valve is abnormal. There is moderate calcification of the mitral valve leaflet(s). Moderate mitral annular calcification. Trivial mitral valve regurgitation. MV peak gradient, 7.8 mmHg. The mean mitral valve gradient is 3.0 mmHg with average heart rate of 72 bpm. Tricuspid Valve: The tricuspid valve is grossly normal. Tricuspid valve regurgitation is mild. Aortic Valve: The aortic valve is tricuspid. Aortic valve regurgitation is not visualized. Mild aortic valve sclerosis is present, with no evidence of aortic valve stenosis. Aortic valve mean gradient measures 7.0 mmHg. Aortic valve peak gradient measures 14.2 mmHg. Aortic valve area, by VTI measures 2.04 cm. Pulmonic Valve: The pulmonic valve was grossly normal. Pulmonic valve regurgitation is not visualized. No evidence of pulmonic stenosis. Aorta: The aortic root and ascending aorta are structurally normal, with no evidence of dilitation. IAS/Shunts: The atrial septum is grossly normal. Agitated saline contrast was given intravenously to evaluate for intracardiac shunting. Agitated saline contrast bubble study was negative, with no evidence of any interatrial shunt.  LEFT VENTRICLE PLAX 2D LVIDd:         5.00 cm     Diastology LVIDs:         1.70 cm     LV e' medial:    4.79 cm/s LV PW:         0.80 cm     LV E/e' medial:  15.3 LV IVS:        0.90 cm     LV e' lateral:    7.94 cm/s LVOT diam:     1.80 cm     LV E/e' lateral: 9.2 LV SV:         72 LV SV Index:   38 LVOT Area:     2.54 cm  LV Volumes (MOD) LV vol d, MOD A2C: 29.3 ml LV vol d, MOD A4C: 36.6 ml LV vol s, MOD A2C: 8.6 ml LV vol s, MOD A4C: 15.8 ml LV SV MOD A2C:     20.7 ml LV SV MOD A4C:     36.6 ml LV SV MOD BP:      20.5 ml RIGHT VENTRICLE RV S prime:     16.00 cm/s  PULMONARY VEINS TAPSE (M-mode): 1.8 cm      A Reversal Duration: 116.00 msec                             A Reversal Velocity: 33.20 cm/s                             Diastolic Velocity:  37.10 cm/s                             S/D Velocity:        1.50                             Systolic Velocity:   62.69 cm/s LEFT ATRIUM             Index       RIGHT ATRIUM           Index LA  diam:        3.70 cm 1.98 cm/m  RA Area:     16.10 cm LA Vol (A2C):   77.2 ml 41.36 ml/m RA Volume:   38.80 ml  20.79 ml/m LA Vol (A4C):   48.1 ml 25.77 ml/m LA Biplane Vol: 65.3 ml 34.99 ml/m  AORTIC VALVE                    PULMONIC VALVE AV Area (Vmax):    2.24 cm     PV Vmax:       0.88 m/s AV Area (Vmean):   2.19 cm     PV Vmean:      64.500 cm/s AV Area (VTI):     2.04 cm     PV VTI:        0.179 m AV Vmax:           188.50 cm/s  PV Peak grad:  3.1 mmHg AV Vmean:          119.500 cm/s PV Mean grad:  2.0 mmHg AV VTI:            0.351 m AV Peak Grad:      14.2 mmHg AV Mean Grad:      7.0 mmHg LVOT Vmax:         166.00 cm/s LVOT Vmean:        103.000 cm/s LVOT VTI:          0.281 m LVOT/AV VTI ratio: 0.80  AORTA Ao Root diam: 3.50 cm Ao Asc diam:  3.20 cm MITRAL VALVE                TRICUSPID VALVE MV Area (PHT): 1.62 cm     TR Peak grad:   32.3 mmHg MV Area VTI:   2.03 cm     TR Vmax:        284.00 cm/s MV Peak grad:  7.8 mmHg MV Mean grad:  3.0 mmHg     SHUNTS MV Vmax:       1.40 m/s     Systemic VTI:  0.28 m MV Vmean:      73.7 cm/s    Systemic Diam: 1.80 cm MV Decel Time: 468 msec MR Peak grad: 43.8 mmHg MR Vmax:      331.00 cm/s MV E velocity: 73.30 cm/s MV A  velocity: 127.00 cm/s MV E/A ratio:  0.58 Rudean Haskell MD Electronically signed by Rudean Haskell MD Signature Date/Time: 12/21/2020/11:24:53 AM    Final        Subjective: Patient seen and examined at bedside.  Denies any worsening shortness of breath.  Complains of some mild cough but getting better.  No overnight fever or vomiting.  Discharge Exam: Vitals:   12/22/20 0410 12/22/20 1005  BP: 98/67 (!) 106/56  Pulse: 69 74  Resp: 17 19  Temp: 98.1 F (36.7 C) 98.1 F (36.7 C)  SpO2: 92% 92%    General: Pt is alert, awake, not in acute distress.  Elderly female lying in bed.  Currently on room air. Cardiovascular: rate controlled, S1/S2 + Respiratory: bilateral decreased breath sounds at bases with some scattered crackles Abdominal: Soft, NT, ND, bowel sounds + Extremities: Trace lower extremity edema; no cyanosis    The results of significant diagnostics from this hospitalization (including imaging, microbiology, ancillary and laboratory) are listed below for reference.     Microbiology: Recent Results (from the past 240 hour(s))  Blood culture (routine x 2)  Status: None (Preliminary result)   Collection Time: 12/20/20  4:44 PM   Specimen: BLOOD  Result Value Ref Range Status   Specimen Description BLOOD SITE NOT SPECIFIED  Final   Special Requests   Final    BOTTLES DRAWN AEROBIC AND ANAEROBIC Blood Culture results may not be optimal due to an inadequate volume of blood received in culture bottles   Culture   Final    NO GROWTH 2 DAYS Performed at [facility], [place] [address], [place], [place] [address]    Report Status PENDING  Incomplete  Blood culture (routine x 2)     Status: None (Preliminary result)   Collection Time: 12/20/20  4:44 PM   Specimen: BLOOD  Result Value Ref Range Status   Specimen Description BLOOD SITE NOT SPECIFIED  Final   Special Requests   Final    BOTTLES DRAWN AEROBIC AND ANAEROBIC Blood Culture results may not be  optimal due to an inadequate volume of blood received in culture bottles   Culture   Final    NO GROWTH 2 DAYS Performed at [facility], [place] [address], [place], [place] [address]    Report Status PENDING  Incomplete  Resp Panel by RT-PCR (Flu A&B, Covid) Nasopharyngeal Swab     Status: None   Collection Time: 12/20/20  4:44 PM   Specimen: Nasopharyngeal Swab; Nasopharyngeal(NP) swabs in vial transport medium  Result Value Ref Range Status   SARS Coronavirus 2 by RT PCR NEGATIVE NEGATIVE Final    Comment: (NOTE) SARS-CoV-2 target nucleic acids are NOT DETECTED.  The SARS-CoV-2 RNA is generally detectable in upper respiratory specimens during the acute phase of infection. The lowest concentration of SARS-CoV-2 viral copies this assay can detect is 138 copies/mL. A negative result does not preclude SARS-Cov-2 infection and should not be used as the sole basis for treatment or other patient management decisions. A negative result may occur with  improper specimen collection/handling, submission of specimen other than nasopharyngeal swab, presence of viral mutation(s) within the areas targeted by this assay, and inadequate number of viral copies(<138 copies/mL). A negative result must be combined with clinical observations, patient history, and epidemiological information. The expected result is Negative.  Fact Sheet for Patients:  EntrepreneurPulse.com.au  Fact Sheet for Healthcare Providers:  IncredibleEmployment.be  This test is no t yet approved or cleared by the [place] FDA and  has been authorized for detection and/or diagnosis of SARS-CoV-2 by FDA under an Emergency Use Authorization (EUA). This EUA will remain  in effect (meaning this test can be used) for the duration of the COVID-19 declaration under Section 564(b)(1) of the Act, 21 U.S.C.section 360bbb-3(b)(1), unless the authorization is terminated  or revoked sooner.        Influenza A by PCR NEGATIVE NEGATIVE Final   Influenza B by PCR NEGATIVE NEGATIVE Final    Comment: (NOTE) The Xpert Xpress SARS-CoV-2/FLU/RSV plus assay is intended as an aid in the diagnosis of influenza from Nasopharyngeal swab specimens and should not be used as a sole basis for treatment. Nasal washings and aspirates are unacceptable for Xpert Xpress SARS-CoV-2/FLU/RSV testing.  Fact Sheet for Patients: EntrepreneurPulse.com.au  Fact Sheet for Healthcare Providers: IncredibleEmployment.be  This test is not yet approved or cleared by the [place] FDA and has been authorized for detection and/or diagnosis of SARS-CoV-2 by FDA under an Emergency Use Authorization (EUA). This EUA will remain in effect (meaning this test can be used) for the duration of  the COVID-19 declaration under Section 564(b)(1) of the Act, 21 U.S.C. section 360bbb-3(b)(1), unless the authorization is terminated or revoked.  Performed at [facility], [place] [address] [place], [place] [address]   Urine culture     Status: Abnormal   Collection Time: 12/21/20 12:01 AM   Specimen: Urine, Random  Result Value Ref Range Status   Specimen Description URINE, RANDOM  Final   Special Requests NONE  Final   Culture (A)  Final    <10,000 COLONIES/mL INSIGNIFICANT GROWTH Performed at [facility], [place] [address], [place], [place] [address]    Report Status 12/22/2020 FINAL  Final     Labs: BNP (last 3 results) Recent Labs    12/20/20 1525  BNP 801.6*   Basic Metabolic Panel: Recent Labs  Lab 12/18/20 0000 12/20/20 1525 12/21/20 0236 12/22/20 0951  NA 139 138 138 137  K 4.4 4.5 4.3 4.4  CL 104 104 106 107  CO2 28* '23 22 24  ' GLUCOSE  --  99 97 103*  BUN '16 15 11 11  ' CREATININE 1.0 0.96 0.94 1.05*  CALCIUM 8.8 9.0 8.5* 8.1*  MG  --   --   --  2.3   Liver Function Tests: Recent Labs  Lab 12/18/20 0000 12/20/20 1525 12/21/20 0236   AST 26 17 13*  ALT '11 11 9  ' ALKPHOS 105 73 66  BILITOT  --  0.8 0.3  PROT  --  7.6 6.8  ALBUMIN 4.2 3.3* 3.0*   No results for input(s): LIPASE, AMYLASE in the last 168 hours. No results for input(s): AMMONIA in the last 168 hours. CBC: Recent Labs  Lab 12/18/20 0000 12/20/20 1525 12/21/20 0236 12/22/20 0951  WBC 24.2 28.2* 23.2* 23.0*  NEUTROABS 19.36 25.7* 20.4* 17.3*  HGB 10.6* 10.3* 9.5* 10.0*  HCT 33* 33.8* 31.5* 32.4*  MCV 96 100.0 102.3* 99.7  PLT 153 172 154 144*   Cardiac Enzymes: Recent Labs  Lab 12/20/20 1525  CKTOTAL 115   BNP: Invalid input(s): POCBNP CBG: No results for input(s): GLUCAP in the last 168 hours. D-Dimer No results for input(s): DDIMER in the last 72 hours. Hgb A1c Recent Labs    12/21/20 0236  HGBA1C 5.8*   Lipid Profile Recent Labs    12/21/20 0236  CHOL 74  HDL 28*  LDLCALC 25  TRIG 104  CHOLHDL 2.6   Thyroid function studies Recent Labs    12/20/20 1525  TSH 1.211   Anemia work up Recent Labs    12/21/20 0000  FOLATE >20   Urinalysis    Component Value Date/Time   COLORURINE YELLOW 12/21/2020 0001   APPEARANCEUR CLEAR 12/21/2020 0001   LABSPEC 1.041 (H) 12/21/2020 0001   PHURINE 5.0 12/21/2020 0001   GLUCOSEU NEGATIVE 12/21/2020 0001   HGBUR NEGATIVE 12/21/2020 0001   BILIRUBINUR NEGATIVE 12/21/2020 0001   KETONESUR NEGATIVE 12/21/2020 0001   PROTEINUR NEGATIVE 12/21/2020 0001   NITRITE NEGATIVE 12/21/2020 0001   LEUKOCYTESUR TRACE (A) 12/21/2020 0001   Sepsis Labs Invalid input(s): PROCALCITONIN,  WBC,  LACTICIDVEN Microbiology Recent Results (from the past 240 hour(s))  Blood culture (routine x 2)     Status: None (Preliminary result)   Collection Time: 12/20/20  4:44 PM   Specimen: BLOOD  Result Value Ref Range Status   Specimen Description BLOOD SITE NOT SPECIFIED  Final   Special Requests   Final    BOTTLES DRAWN AEROBIC AND ANAEROBIC Blood Culture results may not be  optimal due to an  inadequate volume of blood received in culture bottles   Culture   Final    NO GROWTH 2 DAYS Performed at [facility], [place] [address] [place], [place] [address]    Report Status PENDING  Incomplete  Blood culture (routine x 2)     Status: None (Preliminary result)   Collection Time: 12/20/20  4:44 PM   Specimen: BLOOD  Result Value Ref Range Status   Specimen Description BLOOD SITE NOT SPECIFIED  Final   Special Requests   Final    BOTTLES DRAWN AEROBIC AND ANAEROBIC Blood Culture results may not be optimal due to an inadequate volume of blood received in culture bottles   Culture   Final    NO GROWTH 2 DAYS Performed at [facility], [place] [address] [place], [place] [address]    Report Status PENDING  Incomplete  Resp Panel by RT-PCR (Flu A&B, Covid) Nasopharyngeal Swab     Status: None   Collection Time: 12/20/20  4:44 PM   Specimen: Nasopharyngeal Swab; Nasopharyngeal(NP) swabs in vial transport medium  Result Value Ref Range Status   SARS Coronavirus 2 by RT PCR NEGATIVE NEGATIVE Final    Comment: (NOTE) SARS-CoV-2 target nucleic acids are NOT DETECTED.  The SARS-CoV-2 RNA is generally detectable in upper respiratory specimens during the acute phase of infection. The lowest concentration of SARS-CoV-2 viral copies this assay can detect is 138 copies/mL. A negative result does not preclude SARS-Cov-2 infection and should not be used as the sole basis for treatment or other patient management decisions. A negative result may occur with  improper specimen collection/handling, submission of specimen other than nasopharyngeal swab, presence of viral mutation(s) within the areas targeted by this assay, and inadequate number of viral copies(<138 copies/mL). A negative result must be combined with clinical observations, patient history, and epidemiological information. The expected result is Negative.  Fact Sheet for Patients:   EntrepreneurPulse.com.au  Fact Sheet for Healthcare Providers:  IncredibleEmployment.be  This test is no t yet approved or cleared by the [place] FDA and  has been authorized for detection and/or diagnosis of SARS-CoV-2 by FDA under an Emergency Use Authorization (EUA). This EUA will remain  in effect (meaning this test can be used) for the duration of the COVID-19 declaration under Section 564(b)(1) of the Act, 21 U.S.C.section 360bbb-3(b)(1), unless the authorization is terminated  or revoked sooner.       Influenza A by PCR NEGATIVE NEGATIVE Final   Influenza B by PCR NEGATIVE NEGATIVE Final    Comment: (NOTE) The Xpert Xpress SARS-CoV-2/FLU/RSV plus assay is intended as an aid in the diagnosis of influenza from Nasopharyngeal swab specimens and should not be used as a sole basis for treatment. Nasal washings and aspirates are unacceptable for Xpert Xpress SARS-CoV-2/FLU/RSV testing.  Fact Sheet for Patients: EntrepreneurPulse.com.au  Fact Sheet for Healthcare Providers: IncredibleEmployment.be  This test is not yet approved or cleared by the [place] FDA and has been authorized for detection and/or diagnosis of SARS-CoV-2 by FDA under an Emergency Use Authorization (EUA). This EUA will remain in effect (meaning this test can be used) for the duration of the COVID-19 declaration under Section 564(b)(1) of the Act, 21 U.S.C. section 360bbb-3(b)(1), unless the authorization is terminated or revoked.  Performed at [facility], [place] [address] [place], [place] [address]   Urine culture     Status: Abnormal   Collection Time: 12/21/20 12:01 AM  Specimen: Urine, Random  Result Value Ref Range Status   Specimen Description URINE, RANDOM  Final   Special Requests NONE  Final   Culture (A)  Final    <10,000 COLONIES/mL INSIGNIFICANT GROWTH Performed at [facility], [place]  [address], [place],  [address]    Report Status 12/22/2020 FINAL  Final     Time coordinating discharge: 35 minutes  SIGNED:   Aline August, MD  [facility] 12/22/2020, 11:23 AM

## 2020-12-22 NOTE — Plan of Care (Signed)
  Problem: Education: Goal: Knowledge of disease or condition will improve Outcome: Progressing Goal: Knowledge of secondary prevention will improve Outcome: Progressing Goal: Knowledge of patient specific risk factors addressed and post discharge goals established will improve Outcome: Progressing   Problem: Coping: Goal: Will verbalize positive feelings about self Outcome: Progressing Goal: Will identify appropriate support needs Outcome: Progressing   Problem: Spontaneous Subarachnoid Hemorrhage Tissue Perfusion: Goal: Complications of Spontaneous Subarachnoid Hemorrhage will be minimized Outcome: Progressing

## 2020-12-22 NOTE — Evaluation (Signed)
Occupational Therapy Evaluation Patient Details Name: Yolanda Flowers MRN: [id number] DOB: [birth year] Today's Date: 12/22/2020    History of Present Illness Pt is an 80 y/o female admitted secondary to multiple falls. CT showed Acute parafalcine subdural hematoma and MRI showed Multifocal acute ischemia throughout both cerebral and cerebellar  hemispheres, in a pattern most suggestive of watershed infarcts. PMH includes CVA, COPD, and CAD s/p CABG.   Clinical Impression   Patient admitted for the diagnosis above.  PTA she lived at home alone with PRN assist from her son.  She continues to drive, mows her yard, and had no assist for ADL/IADL.  Deficits are listed below.  Currently she is needing up to Min A for basic mobility with a complaint of dizziness: 16 point drop in SBP sot to stand, also slight nystagmus noted when tested for.  ADL portion not able to be thoroughly tested during the eval, and further assessment will need to be completed.  OT will continue to follow her in the acute setting to maximize her functional status, CIR has been recommended.      Follow Up Recommendations  CIR    Equipment Recommendations  3 in 1 bedside commode    Recommendations for Other Services       Precautions / Restrictions Precautions Precautions: Fall Precaution Comments: Multiple falls      Mobility Bed Mobility Overal bed mobility: Needs Assistance Bed Mobility: Supine to Sit     Supine to sit: Min guard;HOB elevated          Transfers Overall transfer level: Needs assistance Equipment used: Rolling walker (2 wheeled) Transfers: Sit to/from [facility] Sit to Stand: Min assist Stand pivot transfers: Min assist       General transfer comment: min A to steady.  Pt limited by c/o dizziness    Balance Overall balance assessment: Needs assistance Sitting-balance support: No upper extremity supported;Feet supported Sitting balance-Leahy Scale: Fair Sitting  balance - Comments: SBP in sitting 133   Standing balance support: Bilateral upper extremity supported;During functional activity Standing balance-Leahy Scale: Poor Standing balance comment: Reliant on BUE and external support               High Level Balance Comments: SBP in stand 115               Vision Baseline Vision/History: Wears glasses Wears Glasses: At all times Patient Visual Report: Other (comment) Vision Assessment?: Yes Eye Alignment: Within Functional Limits Alignment/Gaze Preference: Within Defined Limits Tracking/Visual Pursuits: Decreased smoothness of horizontal tracking Additional Comments: 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.horizontally beating nystagmus noted Lt and Rt gaze     Perception     Praxis      Pertinent Vitals/Pain Pain Assessment: No/denies pain     Hand Dominance Right   Extremity/Trunk Assessment Upper Extremity Assessment Upper Extremity Assessment: Overall WFL for tasks assessed       Cervical / Trunk Assessment Cervical / Trunk Assessment: Kyphotic   Communication Communication Communication: No difficulties   Cognition Arousal/Alertness: Awake/alert Behavior During Therapy: WFL for tasks assessed/performed Overall Cognitive Status: History of cognitive impairments - at baseline                                     General Comments  symptomatic dizziness noted with movement.    Exercises     Shoulder Instructions  Home Living Family/patient expects to be discharged to:: Private residence Living Arrangements: Alone Available Help at Discharge: Family;Available 24 hours/day Type of Home: House Home Access: Stairs to enter [employer] of Steps: 1 Entrance Stairs-Rails: None Home Layout: One level     Bathroom Shower/Tub: Tub/shower unit;Walk-in shower   Bathroom Toilet: Handicapped height     Home Equipment: Clinical cytogeneticist - 2 wheels;Kasandra Knudsen -  single point      Lives With: Alone    Prior Functioning/Environment Level of Independence: Independent        Comments: Pt reports she was fully independent PTA including mowing lawn, driving, grocery shoppinig. She reports she had a fall ~2 weeks ago in the grocery store        OT Problem List: Decreased strength;Decreased activity tolerance;Impaired balance (sitting and/or standing)      OT Treatment/Interventions: Self-care/ADL training;Therapeutic exercise;Neuromuscular education;Therapeutic activities;Balance training;Patient/family education    OT Goals(Current goals can be found in the care plan section) Acute Rehab OT Goals Patient Stated Goal: Be able to get around without falling OT Goal Formulation: With patient Time For Goal Achievement: 01/05/21 Potential to Achieve Goals: Good ADL Goals Pt Will Perform Grooming: with set-up;sitting;standing Pt Will Perform Lower Body Bathing: with supervision;sit to/from stand Pt Will Perform Lower Body Dressing: with supervision;sit to/from stand Pt Will Transfer to Toilet: with modified independence;ambulating;regular height toilet Pt Will Perform Toileting - Clothing Manipulation and hygiene: with modified independence;sit to/from stand  OT Frequency: Min 2X/week   Barriers to D/C:    none noted       Co-evaluation              AM-PAC OT "6 Clicks" Daily Activity     Outcome Measure Help from another person eating meals?: None Help from another person taking care of personal grooming?: None Help from another person toileting, which includes using toliet, bedpan, or urinal?: A Little Help from another person bathing (including washing, rinsing, drying)?: A Little Help from another person to put on and taking off regular upper body clothing?: A Little Help from another person to put on and taking off regular lower body clothing?: A Little 6 Click Score: 20   End of Session Equipment Utilized During Treatment:  Rolling walker Nurse Communication: Mobility status  Activity Tolerance: Patient tolerated treatment well Patient left: in chair;with call bell/phone within reach  OT Visit Diagnosis: Unsteadiness on feet (R26.81);Muscle weakness (generalized) (M62.81);Dizziness and giddiness (R42)                Time: 6314-9702 OT Time Calculation (min): 18 min Charges:  OT General Charges $OT Visit: 1 Visit OT Evaluation $OT Eval Moderate Complexity: 1 Mod  12/22/2020  Rich, OTR/L  Acute Rehabilitation Services  Office:  [place] 12/22/2020, 1:38 PM

## 2020-12-22 NOTE — Progress Notes (Signed)
Physical Therapy Treatment Patient Details Name: Yolanda Flowers MRN: [id number] DOB: 1941/06/29 Today's Date: 12/22/2020    History of Present Illness Pt is an 80 y/o female admitted secondary to multiple falls. CT showed Acute parafalcine subdural hematoma and MRI showed Multifocal acute ischemia throughout both cerebral and cerebellar  hemispheres, in a pattern most suggestive of watershed infarcts. PMH includes CVA, COPD, and CAD s/p CABG.    PT Comments    Session limited by fatigue. Patient performed sit to stand x 4 during session with minA for power up and steady. Patient able to perform stand pivot transfer with minA and RW. Educated patient on getting up to use BSC during day to assist with mobility, endurance, and strength, patient verbalized understanding. Continue to recommend comprehensive inpatient rehab (CIR) for post-acute therapy needs.    Follow Up Recommendations  CIR     Equipment Recommendations  Other (comment) (defer to post acute rehab)    Recommendations for Other Services       Precautions / Restrictions Precautions Precautions: Fall Precaution Comments: Multiple falls Restrictions Weight Bearing Restrictions: No    Mobility  Bed Mobility Overal bed mobility: Needs Assistance Bed Mobility: Sit to Supine     Supine to sit: Min guard;HOB elevated Sit to supine: Min assist   General bed mobility comments: assist for trunk guidance    Transfers Overall transfer level: Needs assistance Equipment used: Rolling Yolanda Flowers (2 wheeled) Transfers: Sit to/from Yolanda Flowers Sit to Stand: Min assist Stand pivot transfers: Min assist       General transfer comment: minA for boost up and steadying from recliner. No complaints of dizziness. Sit to stnad x 4 during session  Ambulation/Gait             General Gait Details: deferred due to complaints of fatigue   Stairs             Wheelchair Mobility    Modified Rankin  (Stroke Patients Only) Modified Rankin (Stroke Patients Only) Pre-Morbid Rankin Score: No significant disability Modified Rankin: Moderately severe disability     Balance Overall balance assessment: Needs assistance Sitting-balance support: No upper extremity supported;Feet supported Sitting balance-Leahy Scale: Fair Sitting balance - Comments: SBP in sitting 133   Standing balance support: Bilateral upper extremity supported;During functional activity Standing balance-Leahy Scale: Poor Standing balance comment: Reliant on BUE and external support               High Level Balance Comments: SBP in stand 115            Cognition Arousal/Alertness: Awake/alert Behavior During Therapy: WFL for tasks assessed/performed Overall Cognitive Status: No family/caregiver present to determine baseline cognitive functioning                                        Exercises      General Comments General comments (skin integrity, edema, etc.): symptomatic dizziness noted with movement.      Pertinent Vitals/Pain Pain Assessment: No/denies pain    Home Living Family/patient expects to be discharged to:: Private residence Living Arrangements: Alone Available Help at Discharge: Family;Available 24 hours/day Type of Home: House Home Access: Stairs to enter Entrance Stairs-Rails: None Home Layout: One level Home Equipment: Clinical cytogeneticist - 2 wheels;Cane - single point      Prior Function Level of Independence: Independent      Comments: Pt reports she  was fully independent PTA including mowing lawn, driving, grocery shoppinig. She reports she had a fall ~2 weeks ago in the grocery store   PT Goals (current goals can now be found in the care plan section) Acute Rehab PT Goals Patient Stated Goal: Be able to get around without falling PT Goal Formulation: With patient Time For Goal Achievement: 01/04/21 Potential to Achieve Goals: Fair Progress towards PT  goals: Progressing toward goals    Frequency    Min 4X/week      PT Plan Current plan remains appropriate    Co-evaluation              AM-PAC PT "6 Clicks" Mobility   Outcome Measure  Help needed turning from your back to your side while in a flat bed without using bedrails?: A Little Help needed moving from lying on your back to sitting on the side of a flat bed without using bedrails?: A Little Help needed moving to and from a bed to a chair (including a wheelchair)?: A Little Help needed standing up from a chair using your arms (e.g., wheelchair or bedside chair)?: A Little Help needed to walk in hospital room?: A Lot Help needed climbing 3-5 steps with a railing? : A Lot 6 Click Score: 16    End of Session Equipment Utilized During Treatment: Gait belt Activity Tolerance: Patient limited by fatigue Patient left: in bed;with call bell/phone within reach;with bed alarm set Nurse Communication: Mobility status PT Visit Diagnosis: Unsteadiness on feet (R26.81);Muscle weakness (generalized) (M62.81)     Time: 9767-3419 PT Time Calculation (min) (ACUTE ONLY): 29 min  Charges:  $Therapeutic Activity: 23-37 mins                     Yolanda Flowers PT, DPT Acute Rehabilitation Services Pager [phone] Office [phone]    Yolanda Flowers 12/22/2020, 2:48 PM

## 2020-12-22 NOTE — Plan of Care (Signed)
  Problem: Education: Goal: Knowledge of disease or condition will improve Outcome: Progressing Goal: Knowledge of secondary prevention will improve Outcome: Progressing Goal: Knowledge of patient specific risk factors addressed and post discharge goals established will improve Outcome: Progressing   Problem: Coping: Goal: Will verbalize positive feelings about self Outcome: Progressing Goal: Will identify appropriate support needs Outcome: Progressing   Problem: Health Behavior/Discharge Planning: Goal: Ability to manage health-related needs will improve Outcome: Progressing   Problem: Self-Care: Goal: Ability to participate in self-care as condition permits will improve Outcome: Progressing Goal: Verbalization of feelings and concerns over difficulty with self-care will improve Outcome: Progressing Goal: Ability to communicate needs accurately will improve Outcome: Progressing   Problem: Health Behavior/Discharge Planning: Goal: Ability to manage health-related needs will improve Outcome: Progressing   Problem: Self-Care: Goal: Ability to participate in self-care as condition permits will improve Outcome: Progressing Goal: Verbalization of feelings and concerns over difficulty with self-care will improve Outcome: Progressing Goal: Ability to communicate needs accurately will improve Outcome: Progressing   Problem: Nutrition: Goal: Risk of aspiration will decrease Outcome: Progressing Goal: Dietary intake will improve Outcome: Progressing   Problem: Intracerebral Hemorrhage Tissue Perfusion: Goal: Complications of Intracerebral Hemorrhage will be minimized Outcome: Progressing   Problem: Ischemic Stroke/TIA Tissue Perfusion: Goal: Complications of ischemic stroke/TIA will be minimized Outcome: Progressing   Problem: Spontaneous Subarachnoid Hemorrhage Tissue Perfusion: Goal: Complications of Spontaneous Subarachnoid Hemorrhage will be minimized Outcome:  Progressing

## 2020-12-22 NOTE — TOC CAGE-AID Note (Signed)
Transition of Care [facility]) - CAGE-AID Screening   Patient Details  Name: Yolanda Flowers MRN: [id number] Date of Birth: May 08, 1941   Bluford Main, RN 12/22/2020, 3:30 PM   Clinical Narrative:  Pt denies drug or alcohol use.   CAGE-AID Screening:    Have You Ever Felt You Ought to Cut Down on Your Drinking or Drug Use?: No Have People Annoyed You By Critizing Your Drinking Or Drug Use?: No Have You Felt Bad Or Guilty About Your Drinking Or Drug Use?: No Have You Ever Had a Drink or Used Drugs First Thing In The Morning to Steady Your Nerves or to Get Rid of a Hangover?: No CAGE-AID Score: 0  Substance Abuse Education Offered: No

## 2020-12-22 NOTE — Progress Notes (Signed)
STROKE TEAM PROGRESS NOTE   INTERVAL HISTORY Daughter is at the bedside. Pt lying in bed, awake alert orientated.  Neuro intact.  No complaints.  CT chest abdomen pelvis showed no malignancy.  PT/OT recommend CIR.  Vitals:   12/22/20 1127 12/22/20 1542 12/22/20 1950 12/22/20 2311  BP: (!) 112/58 110/61 134/70 122/69  Pulse: 77 70 77 75  Resp: _0 Temp: 98.4 F (36.9 C) 99.5 F (37.5 C) 97.7 F (36.5 C) 99 F (37.2 C)  TempSrc: Oral Oral Oral Oral  SpO2: 96% 92% 92% 91%  Weight:      Height:       CBC:  Recent Labs  Lab 12/21/20 0236 12/22/20 0951  WBC 23.2* 23.0*  NEUTROABS 20.4* 17.3*  HGB 9.5* 10.0*  HCT 31.5* 32.4*  MCV 102.3* 99.7  PLT 154 364*   Basic Metabolic Panel:  Recent Labs  Lab 12/21/20 0236 12/22/20 0951  NA 138 137  K 4.3 4.4  CL 106 107  CO2 22 24  GLUCOSE 97 103*  BUN 11 11  CREATININE 0.94 1.05*  CALCIUM 8.5* 8.1*  MG  --  2.3   Lipid Panel:  Recent Labs  Lab 12/21/20 0236  CHOL 74  TRIG 104  HDL 28*  CHOLHDL 2.6  VLDL 21  LDLCALC 25   HgbA1c:  Recent Labs  Lab 12/21/20 0236  HGBA1C 5.8*   Urine Drug Screen: No results for input(s): LABOPIA, COCAINSCRNUR, LABBENZ, AMPHETMU, THCU, LABBARB in the last 168 hours.  Alcohol Level No results for input(s): ETH in the last 168 hours.  IMAGING past 24 hours VAS [place] LOWER EXTREMITY VENOUS (DVT)  Result Date: 12/22/2020  Lower Venous DVT Study Patient Name:  Yolanda Flowers  Date of Exam:   12/21/2020 Medical Rec #: [id number]       Accession #:    [id number] Date of Birth: 12-30-1940      Patient Gender: F Patient Age:   79Y Exam Location:  [facility] Procedure:      VAS [place] LOWER EXTREMITY VENOUS (DVT) Referring Phys: [id number] Rosalin Hawking --------------------------------------------------------------------------------  Indications: Stroke.  Risk Factors: None identified. Comparison Study: No prior studies. Performing Technologist: Oliver Hum RVT  Examination Guidelines: A  complete evaluation includes B-mode imaging, spectral Doppler, color Doppler, and power Doppler as needed of all accessible portions of each vessel. Bilateral testing is considered an integral part of a complete examination. Limited examinations for reoccurring indications may be performed as noted. The reflux portion of the exam is performed with the patient in reverse Trendelenburg.  +---------+---------------+---------+-----------+----------+--------------+ RIGHT    CompressibilityPhasicitySpontaneityPropertiesThrombus Aging +---------+---------------+---------+-----------+----------+--------------+ CFV      Full           Yes      Yes                                 +---------+---------------+---------+-----------+----------+--------------+ SFJ      Full                                                        +---------+---------------+---------+-----------+----------+--------------+ FV Prox  Full                                                        +---------+---------------+---------+-----------+----------+--------------+  FV Mid   Full                                                        +---------+---------------+---------+-----------+----------+--------------+ FV DistalFull                                                        +---------+---------------+---------+-----------+----------+--------------+ PFV      Full                                                        +---------+---------------+---------+-----------+----------+--------------+ POP      Full           Yes      Yes                                 +---------+---------------+---------+-----------+----------+--------------+ PTV      Full                                                        +---------+---------------+---------+-----------+----------+--------------+ PERO     Full                                                         +---------+---------------+---------+-----------+----------+--------------+   +---------+---------------+---------+-----------+----------+--------------+ LEFT     CompressibilityPhasicitySpontaneityPropertiesThrombus Aging +---------+---------------+---------+-----------+----------+--------------+ CFV      Full           Yes      Yes                                 +---------+---------------+---------+-----------+----------+--------------+ SFJ      Full                                                        +---------+---------------+---------+-----------+----------+--------------+ FV Prox  Full                                                        +---------+---------------+---------+-----------+----------+--------------+ FV Mid   Full                                                        +---------+---------------+---------+-----------+----------+--------------+  FV DistalFull                                                        +---------+---------------+---------+-----------+----------+--------------+ PFV      Full                                                        +---------+---------------+---------+-----------+----------+--------------+ POP      Full           Yes      Yes                                 +---------+---------------+---------+-----------+----------+--------------+ PTV      Full                                                        +---------+---------------+---------+-----------+----------+--------------+ PERO     Full                                                        +---------+---------------+---------+-----------+----------+--------------+     Summary: RIGHT: - There is no evidence of deep vein thrombosis in the lower extremity.  - No cystic structure found in the popliteal fossa.  LEFT: - There is no evidence of deep vein thrombosis in the lower extremity.  - No cystic structure found in the popliteal fossa.   *See table(s) above for measurements and observations. Electronically signed by Ruta Hinds MD on 12/22/2020 at 5:35:21 PM.    Final     PHYSICAL EXAM  Temp:  [97.7 F (36.5 C)-99.5 F (37.5 C)] 99 F (37.2 C) (05/06 2311) Pulse Rate:  [69-77] 75 (05/06 2311) Resp:  [17-19] 17 (05/06 2311) BP: (98-134)/(56-70) 122/69 (05/06 2311) SpO2:  [91 %-96 %] 91 % (05/06 2311)  General - Well nourished, well developed, in no apparent distress.  Ophthalmologic - fundi not visualized due to noncooperation.  Cardiovascular - Regular rhythm and rate.  Mental Status -  Level of arousal and orientation to time, place, and person were intact. Language including expression, naming, repetition, comprehension was assessed and found intact.  Cranial Nerves II - XII - II - Visual field intact OU. III, IV, VI - Extraocular movements intact. V - Facial sensation intact bilaterally. VII - Facial movement intact bilaterally. VIII - Hearing & vestibular intact bilaterally. X - Palate elevates symmetrically. XI - Chin turning & shoulder shrug intact bilaterally. XII - Tongue protrusion intact.  Motor Strength - The patient's strength was symmetrical in all extremities and pronator drift was absent.  Bulk was normal and fasciculations were absent.   Motor Tone - Muscle tone was assessed at the neck and appendages and was normal.  Reflexes - The patient's reflexes were symmetrical in all extremities and she had no  pathological reflexes.  Sensory - Light touch, temperature/pinprick were assessed and were symmetrical.    Coordination - The patient had normal movements in the hands with no ataxia or dysmetria.  Tremor was absent.  Gait and Station - deferred.   ASSESSMENT/PLAN Ms. Yolanda Flowers is a 80 y.o. female with history of coronary artery disease, hyperlipidemia, prior strokes with no residual deficits, hypertension, sleep apnea, thrombocytosis history with recent chronic thrombocytopenia with  concern for myelodysplastic syndrome being worked up as outpatient, prior subdural hematoma status post craniotomy at [facility] in 2008, presented for evaluations of multiple falls, last one 2 wks ago.  Acute SDH on Chronic SDH  CT head acute parafalcine SDH 21m, stable chronic L cerebral SDH at crani site  Repeat CT head SDH 860m NSY consulted (JRonnald Ramps/o off, not a surgical candidate  Pt not antiplatelet or anticoagulation candidate  Stroke:   Multiple acute cerebral and cerebellar infarcts, cardioembolic, unclear source   CT head Chronic R parietal cortical infarct.  Small vessel disease.   CTA head and neck no significant stenosis. 77m66mupraclinoid L ICA aneurysm   MRI  Multiple B cerebral and cerebellar "watershed" infarcts  2D Echo w/ bubble EF 65-70%, LA mildly dilated.   CT C/A/P no malignancy  Loop recorder so far no A. fib  LDL 25  HgbA1c 5.8  VTE prophylaxis - SCDs   clopidogrel 75 mg daily prior to admission, now on No antithrombotic given SDH.  Patient not a candidate for antiplatelet or anticoagulation  Therapy recommendations:  pending   Disposition:  pending   Pneumonia UTI  CTA neck showed RUL, RLL and LUL PNA.   UA WBC 11-20  On azithromycin and cefepime  ? MDS  Thrombocytosis history with recent chronic thrombocytopenia and leukocytosis with concern for myelodysplastic syndrome   Bone marrow biopsy planned soon  WBC 28.2->23.2  Platelet 172-> 154  Follow-up with hematology as outpatient  Hypertension  BP Stable < 140 . Long-term BP goal < 140 given recurrent SDH  Hyperlipidemia  Home meds:  liptior 80, resumed in hospital  LDL 25, goal < 70   Decrease Lipitor 40  Continue statin at discharge  Hx stroke/TIA  11/2019 -  Bilateral scattered posterior and anterior infarcts, cardioembolic pattern, occult afib vs. Procedure related with aorta cross clamping. Loop recorder placed. D/c DAPT x 3 wks following plavix load, then plavix  alone.  Loop recorder so far no A. fib  Other Stroke Risk Factors  Advanced Age >/= 65 23Family hx stroke (father)  Coronary artery disease s/p CABG 11/04/2019  Obstructive sleep apnea  Congestive heart failure  Other Active Problems  [facility]  Neurology will sign off. Please call with questions. Pt [facility] stroke [facility]. Thanks for the consult.    JinRosalin HawkingD PhD Stroke Neurology 12/22/2020 11:51 PM    To contact Stroke Continuity provider, please refer to [email or web address], contact General Neurology

## 2020-12-22 NOTE — Progress Notes (Signed)
Patient arrived in the unit at  7pm from [facility], A&Ox4, initiated telemetry monitoring, and will continue to monitor  closely.

## 2020-12-22 NOTE — Progress Notes (Signed)
Inpatient Rehab Admissions Coordinator Note:   Per therapy recommendations, pt was screened for CIR candidacy by Phyllis Whitefield, MS CCC-SLP. At this time, Pt. Appears to have functional decline and is a good candidate for CIR. Will place order for rehab consult per protocol.  Please contact me with questions.   Ashlon Lottman, MS, CCC-SLP Rehab Admissions Coordinator  [phone] (celll) [phone] (office)  

## 2020-12-22 NOTE — H&P (Incomplete)
Physical Medicine and Rehabilitation Admission H&P    Chief Complaint  Patient presents with  . Fall  : HPI: Yolanda Flowers. [name] is an 80 year old right-handed female history of CVA/subdural hematoma 2009, COPD, CAD with CABG 10/2019, hypertension, hyperlipidemia, thrombocytopenia Dr. Hinton Rao, chronic leukocytosis.  Per chart review lives alone independent prior to admission.  1 level home one-step to entry.  Patient does endorse multiple falls.  Presented 12/21/2020 with progressive weakness cough headache and frequent falls.  MRI of the brain showed multifocal acute ischemia throughout both cerebral and cerebellar hemispheres in a pattern most suggestive of watershed infarct as well as unchanged subdural hematoma along the falx cerebri.  CTA of the head and neck showed no large vessel occlusion or high-grade stenosis.  CTA of the chest abdomen pelvis showed no acute intrathoracic or intra-abdominal or intrapelvic trauma.  There was noted multifocal pneumonia most pronounced in the right upper and right lower lobe.  Nodular consolidation within superior segment of left lower lobe measuring 9 mm.  Patient is followed by oncology services and they are aware of solitary nodule.  Admission chemistries unremarkable except TSH 1.211, WBC 28,200, platelets 172,000, troponin 242, CK1 15 lactic acid 1.2 blood cultures no growth to date.  Echocardiogram with ejection fraction of 65 to 70% no wall motion abnormalities grade 1 diastolic dysfunction.  Her elevated troponin was felt to be related to demand ischemia.  Attempts at lower extremity Dopplers patient refused.  Patient not a candidate for anticoagulation or antiplatelet given SDH.  She was maintained on Rocephin as well as Zithromax for CAP.  Therapy evaluations completed due to patient decreased functional mobility she was admitted for a comprehensive rehab program.  Review of Systems  Constitutional: Negative for chills and fever.  HENT: Negative for  hearing loss.   Eyes: Negative for blurred vision and double vision.  Respiratory: Positive for cough and shortness of breath.   Cardiovascular: Positive for leg swelling. Negative for chest pain.  Gastrointestinal: Positive for constipation. Negative for heartburn, nausea and vomiting.       GERD  Genitourinary: Negative for dysuria, flank pain and hematuria.  Musculoskeletal: Positive for falls, joint pain and myalgias.  Skin: Negative for rash.  Neurological: Positive for weakness and headaches.  Psychiatric/Behavioral: The patient has insomnia.        Anxiety  All other systems reviewed and are negative.  Past Medical History:  Diagnosis Date  . Abdominal pain   . Accelerating angina ([place]) 08/26/2019  . Acute blood loss anemia 12/14/2019  . Allergic rhinitis 06/21/2014  . Anxiety   . Arthritis   . Asthma   . Atherosclerosis of arteries 08/24/2019  . Bilateral edema of lower extremity   . Body mass index 29.0-29.9, adult 08/24/2019  . CAD (coronary artery disease) 10/25/2019  . Cellulitis of right leg   . Combined hyperlipidemia 08/24/2019  . Complication of anesthesia    difficulty waking  . Congestive heart failure (CHF) ([place])   . COPD (chronic obstructive pulmonary disease) ([place])   . Coronary artery disease   . Dyspnea 02/09/2019  . Dyspnea on exertion   . Embolic stroke ([place]) [id number]  . Essential thrombocythemia ([place]) 06/22/2014  . Essential thrombocytosis ([place]) 06/22/2014  . Gastrointestinal bleeding 06/23/2018  . GERD (gastroesophageal reflux disease)   . History of melanoma excision    left greast toe 2015  . History of squamous cell carcinoma excision    face--  multiple excisions  . History of subdural hematoma  2008  . HTN (hypertension) 06/21/2014  . Hypertension   . Ischial bursitis, right 08/24/2019  . Leucocytosis 12/14/2019  . Malignant melanoma of great toe ([place]) 08/24/2019  . Melanoma in situ ([place]) 06/29/2014  . OSA (obstructive sleep apnea)    intolerant  .  Pedal edema 08/24/2019  . Renal insufficiency 08/24/2019  . S/P CABG x 4 11/04/2019  . Skin cancer 08/24/2019  . Solitary pulmonary nodule on lung CT 02/09/2019  . Squamous cell carcinoma, scalp/neck 08/24/2019  . Stroke due to embolism ([place]) 11/25/2019  . Swelling of limb 08/24/2019  . Thrombocytosis    takes hydoxyurea--  MONITORED BY DR Hinton Rao [facility])  . VIN III (vulvar intraepithelial neoplasia III)   . Vitamin B 12 deficiency 08/24/2019  . Vitamin D deficiency 08/24/2019  . Wears dentures    UPPER AND LOWER PARTIAL  . Wears glasses    Past Surgical History:  Procedure Laterality Date  . ABDOMINAL HYSTERECTOMY  age 46  . CARDIAC CATHETERIZATION  03/172021  . CORONARY ARTERY BYPASS GRAFT N/A 11/04/2019   Procedure: CORONARY ARTERY BYPASS GRAFTING (CABG) x 4, with ENDOSCOPIC HARVESTING OF RIGHT GREATER SAPHENOUS VEIN.;  Surgeon: Gaye Pollack, MD;  Location: [facility] OR;  Service: Open Heart Surgery;  Laterality: N/A;  . INTRAVASCULAR ULTRASOUND/IVUS N/A 11/03/2019   Procedure: Intravascular Ultrasound/IVUS;  Surgeon: Nelva Bush, MD;  Location: [facility] LAB;  Service: Cardiovascular;  Laterality: N/A;  . KNEE ARTHROSCOPY Left 2004  . LEFT HEART CATH AND CORONARY ANGIOGRAPHY N/A 11/03/2019   Procedure: LEFT HEART CATH AND CORONARY ANGIOGRAPHY;  Surgeon: Nelva Bush, MD;  Location: [place] [facility];  Service: Cardiovascular;  Laterality: N/A;  . LOOP RECORDER INSERTION N/A 11/30/2019   Procedure: LOOP RECORDER INSERTION;  Surgeon: Thompson Grayer, MD;  Location: [facility];  Service: Cardiovascular;  Laterality: N/A;  . MELANOMA EXCISION  2015   left great toe  . REPAIR PERONEAL TENDONS ANKLE  2004  . SUBDURAL HEMATOMA EVACUATION VIA CRANIOTOMY  2008      week later  post-op  [facility]  . TEE WITHOUT CARDIOVERSION N/A 11/04/2019   Procedure: TRANSESOPHAGEAL ECHOCARDIOGRAM (TEE);  Surgeon: Gaye Pollack, MD;  Location: [facility];  Service: Open Heart Surgery;   Laterality: N/A;  . VULVECTOMY N/A 10/24/2015   Procedure: WIDE LOCAL EXCISION OF THE VULVA ;  Surgeon: Everitt Amber, MD;  Location: [facility];  Service: Gynecology;  Laterality: N/A;   Family History  Problem Relation Age of Onset  . Clotting disorder Mother   . Hypertension Mother   . Heart disease Mother   . Arthritis Mother   . Stroke Father   . Hypertension Father   . Heart disease Father   . Arthritis Father   . Emphysema Father   . Asthma Father   . Kidney failure Father   . Hypertension Sister   . Heart disease Sister   . Arthritis Sister   . Hypertension Brother   . Heart disease Brother   . Arthritis Brother    Social History:  reports that she has never smoked. She has never used smokeless tobacco. She reports that she does not drink alcohol and does not use drugs. Allergies:  Allergies  Allergen Reactions  . Cephalexin Hives  . Penicillin G   . Ciprofloxacin Other (See Comments)    Gi intolerance  . Penicillins Rash    Childhood reaction Did it involve swelling of the face/tongue/throat, SOB, or low  BP? No Did it involve sudden or severe rash/hives, skin peeling, or any reaction on the inside of your mouth or nose? No Did you need to seek medical attention at a hospital or doctor's office? No When did it last happen?50 years ago If all above answers are "NO", may proceed with cephalosporin use.    Medications Prior to Admission  Medication Sig Dispense Refill  . acetaminophen (TYLENOL) 325 MG tablet Take 1-2 tablets (325-650 mg total) by mouth every 4 (four) hours as needed for mild pain.    Marland Kitchen ammonium lactate (LAC-HYDRIN) 12 % lotion Apply 1 application topically as needed for dry skin.    . Ascorbic Acid (VITAMIN C) 1000 MG tablet Take 1,000 mg by mouth daily.    Marland Kitchen atorvastatin (LIPITOR) 80 MG tablet Take 80 mg by mouth daily. Takes it at 6pm daily    . Calcium Carb-Cholecalciferol (CALCIUM 600 + D PO) Take 1 tablet by mouth in the  morning and at bedtime.     . clopidogrel (PLAVIX) 75 MG tablet Take 1 tablet (75 mg total) by mouth daily. 90 tablet 3  . fluticasone (FLONASE) 50 MCG/ACT nasal spray Place 2 sprays into both nostrils at bedtime.     . Magnesium 250 MG TABS Take 250 mg by mouth at bedtime.     . metoprolol tartrate (LOPRESSOR) 25 MG tablet Take 1 tablet (25 mg total) by mouth 2 (two) times daily. 180 tablet 3  . Multiple Vitamins-Minerals (PRESERVISION AREDS 2) CAPS Take 1 capsule by mouth in the morning and at bedtime.     . pantoprazole (PROTONIX) 40 MG tablet Take 1 tablet (40 mg total) by mouth daily. 90 tablet 3  . traMADol (ULTRAM) 50 MG tablet Take 1 tablet (50 mg total) by mouth every 6 (six) hours as needed for moderate pain. 30 tablet   . Vitamin D, Ergocalciferol, (DRISDOL) 1.25 MG (50000 UT) CAPS capsule Take 50,000 Units by mouth every 14 (fourteen) days.      Drug Regimen Review Drug regimen was reviewed and remains appropriate with no significant issues identified  Home: Home Living Family/patient expects to be discharged to:: Private residence Living Arrangements: Alone Available Help at Discharge: Family,Available 24 hours/day Type of Home: House Home Access: Stairs to enter [employer] of Steps: 1 Entrance Stairs-Rails: None Home Layout: One level Bathroom Shower/Tub: Tub/shower Psychologist, occupational: Handicapped height Home Equipment: Media planner - 2 wheels,Cane - single point   Functional History: Prior Function Level of Independence: Independent Comments: Reports she has not been using any AD, but reports it has been recommended for her to use one.  Functional Status:  Mobility: Bed Mobility Overal bed mobility: Needs Assistance Bed Mobility: Supine to Sit,Sit to Supine Supine to sit: Min assist Sit to supine: Min assist General bed mobility comments: Min A for trunk and LE assist throughout. Transfers Overall transfer level: Needs  assistance Equipment used: Rolling walker (2 wheeled) Transfers: Sit to/from [employer] Sit to Stand: Min assist Stand pivot transfers: Min assist,Mod assist General transfer comment: Pt requiring min A for lift assist and steadying to stand. Mild posterior lean initially and required assist to come to fully upright. initially requiring min A for steadying, but then required mod A towards end of transfer as pt's knees began to give way.      ADL:    Cognition: Cognition Overall Cognitive Status: No family/caregiver present to determine baseline cognitive functioning Orientation Level: Oriented X4 Cognition Arousal/Alertness: Awake/alert Behavior  During Therapy: WFL for tasks assessed/performed Overall Cognitive Status: No family/caregiver present to determine baseline cognitive functioning General Comments: A and O X4  Physical Exam: Blood pressure (!) 106/56, pulse 74, temperature 98.1 F (36.7 C), temperature source Oral, resp. rate 19, height 5\' 6"  (1.676 m), weight 77.1 kg, SpO2 92 %. Physical Exam Neurological:     Comments: Patient is alert in no acute distress.  Makes eye contact with examiner.  Oriented to person and place.  Follows commands.     Results for orders placed or performed during the hospital encounter of 12/20/20 (from the past 48 hour(s))  CBC with Differential     Status: Abnormal   Collection Time: 12/20/20  3:25 PM  Result Value Ref Range   WBC 28.2 (H) 4.0 - 10.5 K/uL   RBC 3.38 (L) 3.87 - 5.11 MIL/uL   Hemoglobin 10.3 (L) 12.0 - 15.0 g/dL   HCT 33.8 (L) 36.0 - 46.0 %   MCV 100.0 80.0 - 100.0 fL   MCH 30.5 26.0 - 34.0 pg   MCHC 30.5 30.0 - 36.0 g/dL   RDW 16.6 (H) 11.5 - 15.5 %   Platelets 172 150 - 400 K/uL    Comment: REPEATED TO VERIFY   nRBC 0.1 0.0 - 0.2 %   Neutrophils Relative % 91 %   Neutro Abs 25.7 (H) 1.7 - 7.7 K/uL   Lymphocytes Relative 6 %   Lymphs Abs 1.7 0.7 - 4.0 K/uL   Monocytes Relative 1 %   Monocytes  Absolute 0.3 0.1 - 1.0 K/uL   Eosinophils Relative 2 %   Eosinophils Absolute 0.6 (H) 0.0 - 0.5 K/uL   Basophils Relative 0 %   Basophils Absolute 0.0 0.0 - 0.1 K/uL   WBC Morphology See Note     Comment: Mild Left Shift. 1 to 5% Metas and Myelos, Occ Pro Noted.   nRBC 0 0 /100 WBC   Abs Immature Granulocytes 0.00 0.00 - 0.07 K/uL   Tear Drop Cells PRESENT     Comment: Performed at [facility], [place] [address], [place], [place] [address]  Comprehensive metabolic panel     Status: Abnormal   Collection Time: 12/20/20  3:25 PM  Result Value Ref Range   Sodium 138 135 - 145 mmol/L   Potassium 4.5 3.5 - 5.1 mmol/L   Chloride 104 98 - 111 mmol/L   CO2 23 22 - 32 mmol/L   Glucose, Bld 99 70 - 99 mg/dL    Comment: Glucose reference range applies only to samples taken after fasting for at least 8 hours.   BUN 15 8 - 23 mg/dL   Creatinine, Ser 0.96 0.44 - 1.00 mg/dL   Calcium 9.0 8.9 - 10.3 mg/dL   Total Protein 7.6 6.5 - 8.1 g/dL   Albumin 3.3 (L) 3.5 - 5.0 g/dL   AST 17 15 - 41 U/L   ALT 11 0 - 44 U/L   Alkaline Phosphatase 73 38 - 126 U/L   Total Bilirubin 0.8 0.3 - 1.2 mg/dL   GFR, Estimated 60 (L) >60 mL/min    Comment: (NOTE) Calculated using the CKD-EPI Creatinine Equation (2021)    Anion gap 11 5 - 15    Comment: Performed at [facility] [address], [place], [place] [address]  TSH     Status: None   Collection Time: 12/20/20  3:25 PM  Result Value Ref Range   TSH 1.211 0.350 - 4.500 uIU/mL  Comment: Performed by a 3rd Generation assay with a functional sensitivity of <=0.01 uIU/mL. Performed at [facility], [place] [address], [place], [place] [address]   Troponin I (High Sensitivity)     Status: Abnormal   Collection Time: 12/20/20  3:25 PM  Result Value Ref Range   Troponin I (High Sensitivity) 242 (HH) <18 ng/L    Comment: CRITICAL RESULT CALLED TO, READ BACK BY AND VERIFIED WITH: S GORDY,RN 1828 12/20/2020 WBOND (NOTE) Elevated high  sensitivity troponin I (hsTnI) values and significant  changes across serial measurements may suggest ACS but many other  chronic and acute conditions are known to elevate hsTnI results.  Refer to the Links section for chest pain algorithms and additional  guidance. Performed at [facility], [place] [address] [place], [place] [address]   Brain natriuretic peptide     Status: Abnormal   Collection Time: 12/20/20  3:25 PM  Result Value Ref Range   B Natriuretic Peptide 400.9 (H) 0.0 - 100.0 pg/mL    Comment: Performed at [facility] [address], [place], [place] [address]  Protime-INR     Status: Abnormal   Collection Time: 12/20/20  3:25 PM  Result Value Ref Range   Prothrombin Time 15.4 (H) 11.4 - 15.2 seconds   INR 1.2 0.8 - 1.2    Comment: (NOTE) INR goal varies based on device and disease states. Performed at [facility], [place] [address] [place], [place] [address]   CK     Status: None   Collection Time: 12/20/20  3:25 PM  Result Value Ref Range   Total CK 115 38 - 234 U/L    Comment: Performed at [facility], [place] [address] [place], [place] [address]  Lactic acid, plasma     Status: None   Collection Time: 12/20/20  4:44 PM  Result Value Ref Range   Lactic Acid, Venous 1.2 0.5 - 1.9 mmol/L    Comment: Performed at [facility] [address] [place], [place] [address]  Blood culture (routine x 2)     Status: None (Preliminary result)   Collection Time: 12/20/20  4:44 PM   Specimen: BLOOD  Result Value Ref Range   Specimen Description BLOOD SITE NOT SPECIFIED    Special Requests      BOTTLES DRAWN AEROBIC AND ANAEROBIC Blood Culture results may not be optimal due to an inadequate volume of blood received in culture bottles   Culture      NO GROWTH 2 DAYS Performed at [facility], [place] [address] [place],  [address]    Report Status PENDING   Blood culture (routine x 2)     Status: None (Preliminary result)    Collection Time: 12/20/20  4:44 PM   Specimen: BLOOD  Result Value Ref Range   Specimen Description BLOOD SITE NOT SPECIFIED    Special Requests      BOTTLES DRAWN AEROBIC AND ANAEROBIC Blood Culture results may not be optimal due to an inadequate volume of blood received in culture bottles   Culture      NO GROWTH 2 DAYS Performed at [facility], [place] [address], [place],  [address]    Report Status PENDING   Resp Panel by RT-PCR (Flu A&B, Covid) Nasopharyngeal Swab     Status: None   Collection Time: 12/20/20  4:44 PM   Specimen: Nasopharyngeal Swab; Nasopharyngeal(NP) swabs in vial transport medium  Result Value Ref Range   SARS Coronavirus 2 by RT PCR NEGATIVE NEGATIVE    Comment: (NOTE) SARS-CoV-2 target nucleic acids are NOT DETECTED.  The SARS-CoV-2 RNA is generally detectable in upper respiratory specimens during the acute phase of infection. The lowest concentration of SARS-CoV-2 viral copies this assay can detect is 138 copies/mL. A negative result does not preclude SARS-Cov-2 infection and should not be used as the sole basis for treatment or other patient management decisions. A negative result may occur with  improper specimen collection/handling, submission of specimen other than nasopharyngeal swab, presence of viral mutation(s) within the areas targeted by this assay, and inadequate number of viral copies(<138 copies/mL). A negative result must be combined with clinical observations, patient history, and epidemiological information. The expected result is Negative.  Fact Sheet for Patients:  EntrepreneurPulse.com.au  Fact Sheet for Healthcare Providers:  IncredibleEmployment.be  This test is no t yet approved or cleared by the [place] FDA and  has been authorized for detection and/or diagnosis of SARS-CoV-2 by FDA under an Emergency Use Authorization (EUA). This EUA will remain  in effect (meaning this test  can be used) for the duration of the COVID-19 declaration under Section 564(b)(1) of the Act, 21 U.S.C.section 360bbb-3(b)(1), unless the authorization is terminated  or revoked sooner.       Influenza A by PCR NEGATIVE NEGATIVE   Influenza B by PCR NEGATIVE NEGATIVE    Comment: (NOTE) The Xpert Xpress SARS-CoV-2/FLU/RSV plus assay is intended as an aid in the diagnosis of influenza from Nasopharyngeal swab specimens and should not be used as a sole basis for treatment. Nasal washings and aspirates are unacceptable for Xpert Xpress SARS-CoV-2/FLU/RSV testing.  Fact Sheet for Patients: EntrepreneurPulse.com.au  Fact Sheet for Healthcare Providers: IncredibleEmployment.be  This test is not yet approved or cleared by the [place] FDA and has been authorized for detection and/or diagnosis of SARS-CoV-2 by FDA under an Emergency Use Authorization (EUA). This EUA will remain in effect (meaning this test can be used) for the duration of the COVID-19 declaration under Section 564(b)(1) of the Act, 21 U.S.C. section 360bbb-3(b)(1), unless the authorization is terminated or revoked.  Performed at [facility], [place] [address], [place], [place] [address]   Lactic acid, plasma     Status: None   Collection Time: 12/20/20  6:15 PM  Result Value Ref Range   Lactic Acid, Venous 1.1 0.5 - 1.9 mmol/L    Comment: Performed at [facility] [address] [place], [place] [address]  Troponin I (High Sensitivity)     Status: Abnormal   Collection Time: 12/20/20  6:15 PM  Result Value Ref Range   Troponin I (High Sensitivity) 233 (HH) <18 ng/L    Comment: CRITICAL VALUE NOTED.  VALUE IS CONSISTENT WITH PREVIOUSLY REPORTED AND CALLED VALUE. (NOTE) Elevated high sensitivity troponin I (hsTnI) values and significant  changes across serial measurements may suggest ACS but many other  chronic and acute conditions are known to elevate hsTnI  results.  Refer to the Links section for chest pain algorithms and additional  guidance. Performed at [facility], [place] [address] [place], [place] [address]   Urinalysis, Routine w reflex microscopic Urine, Random     Status: Abnormal   Collection Time: 12/21/20 12:01 AM  Result Value Ref Range   Color, Urine YELLOW YELLOW   APPearance CLEAR CLEAR   Specific Gravity, Urine 1.041 (H) 1.005 - 1.030   pH 5.0 5.0 -  8.0   Glucose, UA NEGATIVE NEGATIVE mg/dL   Hgb urine dipstick NEGATIVE NEGATIVE   Bilirubin Urine NEGATIVE NEGATIVE   Ketones, ur NEGATIVE NEGATIVE mg/dL   Protein, ur NEGATIVE NEGATIVE mg/dL   Nitrite NEGATIVE NEGATIVE   Leukocytes,Ua TRACE (A) NEGATIVE   RBC / HPF 0-5 0 - 5 RBC/hpf   WBC, UA 11-20 0 - 5 WBC/hpf   Bacteria, UA NONE SEEN NONE SEEN   Mucus PRESENT     Comment: Performed at [facility] [address], [place], [place] [address]  Urine culture     Status: Abnormal   Collection Time: 12/21/20 12:01 AM   Specimen: Urine, Random  Result Value Ref Range   Specimen Description URINE, RANDOM    Special Requests NONE    Culture (A)     <10,000 COLONIES/mL INSIGNIFICANT GROWTH Performed at [facility], [place] [address], [place], [place] [address]    Report Status 12/22/2020 FINAL   C-reactive protein     Status: Abnormal   Collection Time: 12/21/20  2:36 AM  Result Value Ref Range   CRP 2.3 (H) <1.0 mg/dL    Comment: Performed at [facility], [place] [address], [place], [place] [address]  Procalcitonin - Baseline     Status: None   Collection Time: 12/21/20  2:36 AM  Result Value Ref Range   Procalcitonin <0.10 ng/mL    Comment:        Interpretation: PCT (Procalcitonin) <= 0.5 ng/mL: Systemic infection (sepsis) is not likely. Local bacterial infection is possible. (NOTE)       Sepsis PCT Algorithm           Lower Respiratory Tract                                      Infection PCT Algorithm    ----------------------------      ----------------------------         PCT < 0.25 ng/mL                PCT < 0.10 ng/mL          Strongly encourage             Strongly discourage   discontinuation of antibiotics    initiation of antibiotics    ----------------------------     -----------------------------       PCT 0.25 - 0.50 ng/mL            PCT 0.10 - 0.25 ng/mL               OR       >80% decrease in PCT            Discourage initiation of                                            antibiotics      Encourage discontinuation           of antibiotics    ----------------------------     -----------------------------         PCT >= 0.50 ng/mL              PCT 0.26 - 0.50 ng/mL  AND        <80% decrease in PCT             Encourage initiation of                                             antibiotics       Encourage continuation           of antibiotics    ----------------------------     -----------------------------        PCT >= 0.50 ng/mL                  PCT > 0.50 ng/mL               AND         increase in PCT                  Strongly encourage                                      initiation of antibiotics    Strongly encourage escalation           of antibiotics                                     -----------------------------                                           PCT <= 0.25 ng/mL                                                 OR                                        > 80% decrease in PCT                                      Discontinue / Do not initiate                                             antibiotics  Performed at [facility], [place] [address], [place], [place] [address]   Hemoglobin A1c     Status: Abnormal   Collection Time: 12/21/20  2:36 AM  Result Value Ref Range   Hgb A1c MFr Bld 5.8 (H) 4.8 - 5.6 %    Comment: (NOTE) Pre diabetes:          5.7%-6.4%  Diabetes:              >6.4%  Glycemic control for   <7.0% adults with diabetes    Mean Plasma Glucose  119.76 mg/dL  Comment: Performed at [facility], [place] [address], [place], [place] [address]  Lipid panel     Status: Abnormal   Collection Time: 12/21/20  2:36 AM  Result Value Ref Range   Cholesterol 74 0 - 200 mg/dL   Triglycerides 104 <150 mg/dL   HDL 28 (L) >40 mg/dL   Total CHOL/HDL Ratio 2.6 RATIO   VLDL 21 0 - 40 mg/dL   LDL Cholesterol 25 0 - 99 mg/dL    Comment:        Total Cholesterol/HDL:CHD Risk Coronary Heart Disease Risk Table                     Men   Women  1/2 Average Risk   3.4   3.3  Average Risk       5.0   4.4  2 X Average Risk   9.6   7.1  3 X Average Risk  23.4   11.0        Use the calculated Patient Ratio above and the CHD Risk Table to determine the patient's CHD Risk.        ATP III CLASSIFICATION (LDL):  <100     mg/dL   Optimal  100-129  mg/dL   Near or Above                    Optimal  130-159  mg/dL   Borderline  160-189  mg/dL   High  >190     mg/dL   Very High Performed at [facility] [address] [place], [place] [address]   Comprehensive metabolic panel     Status: Abnormal   Collection Time: 12/21/20  2:36 AM  Result Value Ref Range   Sodium 138 135 - 145 mmol/L   Potassium 4.3 3.5 - 5.1 mmol/L   Chloride 106 98 - 111 mmol/L   CO2 22 22 - 32 mmol/L   Glucose, Bld 97 70 - 99 mg/dL    Comment: Glucose reference range applies only to samples taken after fasting for at least 8 hours.   BUN 11 8 - 23 mg/dL   Creatinine, Ser 0.94 0.44 - 1.00 mg/dL   Calcium 8.5 (L) 8.9 - 10.3 mg/dL   Total Protein 6.8 6.5 - 8.1 g/dL   Albumin 3.0 (L) 3.5 - 5.0 g/dL   AST 13 (L) 15 - 41 U/L   ALT 9 0 - 44 U/L   Alkaline Phosphatase 66 38 - 126 U/L   Total Bilirubin 0.3 0.3 - 1.2 mg/dL   GFR, Estimated >60 >60 mL/min    Comment: (NOTE) Calculated using the CKD-EPI Creatinine Equation (2021)    Anion gap 10 5 - 15    Comment: Performed at [facility], [place] [address], [place], [place] [address]  Protime-INR     Status:  Abnormal   Collection Time: 12/21/20  2:36 AM  Result Value Ref Range   Prothrombin Time 15.8 (H) 11.4 - 15.2 seconds   INR 1.3 (H) 0.8 - 1.2    Comment: (NOTE) INR goal varies based on device and disease states. Performed at [facility], [place] [address], [place],  [address]   APTT     Status: None   Collection Time: 12/21/20  2:36 AM  Result Value Ref Range   aPTT 33 24 - 36 seconds    Comment: Performed at [facility] [place],  [place] [address]  CBC with Differential/Platelet     Status: Abnormal   Collection Time: 12/21/20  2:36 AM  Result Value Ref Range   WBC 23.2 (H) 4.0 - 10.5 K/uL   RBC 3.08 (L) 3.87 - 5.11 MIL/uL   Hemoglobin 9.5 (L) 12.0 - 15.0 g/dL   HCT 31.5 (L) 36.0 - 46.0 %   MCV 102.3 (H) 80.0 - 100.0 fL   MCH 30.8 26.0 - 34.0 pg   MCHC 30.2 30.0 - 36.0 g/dL   RDW 16.9 (H) 11.5 - 15.5 %   Platelets 154 150 - 400 K/uL    Comment: REPEATED TO VERIFY   nRBC 0.1 0.0 - 0.2 %   Neutrophils Relative % 88 %   Neutro Abs 20.4 (H) 1.7 - 7.7 K/uL   Lymphocytes Relative 4 %   Lymphs Abs 0.9 0.7 - 4.0 K/uL   Monocytes Relative 4 %   Monocytes Absolute 0.9 0.1 - 1.0 K/uL   Eosinophils Relative 0 %   Eosinophils Absolute 0.0 0.0 - 0.5 K/uL   Basophils Relative 1 %   Basophils Absolute 0.2 (H) 0.0 - 0.1 K/uL   nRBC 1 (H) 0 /100 WBC   Metamyelocytes Relative 3 %   Abs Immature Granulocytes 0.70 (H) 0.00 - 0.07 K/uL    Comment: Performed at [facility] [address] [place], [place] [address]  HIV Antibody (routine testing w rflx)     Status: None   Collection Time: 12/21/20  2:36 AM  Result Value Ref Range   HIV Screen 4th Generation wRfx Non Reactive Non Reactive    Comment: Performed at [facility], [place] [address], [place], [place] [address]   CT Angio Head W or Wo Contrast  Result Date: 12/20/2020 CLINICAL DATA:  Neuro deficit, acute, stroke suspected; history of intracerebral aneurysm, headache for several days,  multiple falls today, pain in neck, rule out bleed or aneurysmal change. EXAM: CT ANGIOGRAPHY HEAD AND NECK TECHNIQUE: Multidetector CT imaging of the head and neck was performed using the standard protocol during bolus administration of intravenous contrast. Multiplanar CT image reconstructions and MIPs were obtained to evaluate the vascular anatomy. Carotid stenosis measurements (when applicable) are obtained utilizing NASCET criteria, using the distal internal carotid diameter as the denominator. CONTRAST:  34mL OMNIPAQUE IOHEXOL 350 MG/ML SOLN COMPARISON:  Brain MRI 11/25/2019. CT angiogram head/neck 11/25/2019. FINDINGS: CT HEAD FINDINGS Brain: Mild cerebral and cerebellar atrophy. Acute parafalcine subdural hematoma measuring up to 10 mm in greatest thickness (series 5, image 11). Unchanged size of a thin chronic subdural hematoma overlying the left cerebral hemisphere, measuring up to 4 mm, deep to the left-sided cranioplasty. Subacute infarct within the left thalamus. Additional known small subacute infarcts within the supratentorial and infratentorial brain were better appreciated on the prior brain MRI of 11/25/2019. 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 Advanced patchy and ill-defined hypoattenuation within the cerebral white matter, nonspecific but compatible with chronic small vessel ischemic disease. No acute demarcated cortical infarct. No evidence of intracranial mass. No midline shift. Vascular: No hyperdense vessel.  Atherosclerotic calcifications Skull: No calvarial fracture.  Left-sided cranioplasty. Sinuses: Scattered trace mucosal thickening and fluid within the bilateral ethmoid air cells. Small right maxillary sinus mucous retention cyst. Orbits: No mass or acute finding. Review of the MIP images confirms the above findings CTA NECK FINDINGS Aortic arch: Standard aortic branching. Atherosclerotic plaque within the visualized aortic arch and proximal major branch  vessels of the neck. No  hemodynamically significant innominate or proximal subclavian artery stenosis. Right carotid system: CCA and ICA patent within the neck without significant stenosis (50% or greater). Mild calcified plaque within the carotid bifurcation and proximal ICA. Left carotid system: CCA and ICA patent within the neck without significant stenosis (50% or greater). Mild soft and calcified plaque within the carotid bifurcation and proximal ICA. Vertebral arteries: Codominant patent within the neck without stenosis. Mild nonstenotic calcified plaque at the origin of the right vertebral artery. Skeleton: No acute bony abnormality or aggressive osseous lesion. Cervical spondylosis. Cervical levocurvature with partially imaged thoracic dextrocurvature. Other neck: Neck mass or cervical lymphadenopathy. Upper chest: Consolidation within portions of the right upper lobe and within imaged portions of the superior right lower lobe compatible with pneumonia. A small ill-defined opacity also present within the left lung apex likely reflecting an additional site of pneumonia. Prior median sternotomy. Review of the MIP images confirms the above findings CTA HEAD FINDINGS Anterior circulation: The intracranial internal carotid arteries are patent. Plaque within both vessels without stenosis. Unchanged 2 mm inferomedially projecting aneurysm arising from the supraclinoid left ICA (series 12, image 122). The M1 middle cerebral arteries are patent. No M2 proximal branch occlusion or high-grade proximal stenosis is identified. The anterior cerebral arteries are patent. Posterior circulation: The intracranial vertebral arteries are patent. The basilar artery is patent. The posterior cerebral arteries are patent. Posterior communicating arteries are hypoplastic or absent bilaterally. Venous sinuses: Within the limitations of contrast timing, no convincing thrombus. Anatomic variants: As described Review of the MIP images  confirms the above findings Acute CT head findings called by telephone at the time of interpretation on 12/20/2020 at 7:55 pm to provider [facility] , who verbally acknowledged these results. IMPRESSION: CT head: 1. Acute parafalcine subdural hematoma measuring up to 10 mm in greatest thickness. 2. Unchanged thin chronic subdural hematoma overlying the left cerebral hemisphere, deep to the left-sided cranioplasty (4 mm in thickness). 3. Subacute infarct within the left thalamus. Additional known small subacute infarcts within the supratentorial and infratentorial brain were better appreciated on the prior brain MRI of 11/25/2019. 4. 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 5. Stable parenchymal atrophy and severe cerebral white matter chronic small vessel ischemic disease. 6. Mild paranasal sinus disease, as described. CTA neck: 1. The common carotid, internal carotid and vertebral arteries are patent within the neck without hemodynamically significant stenosis. Atherosclerotic disease, as described. 2. Findings compatible right upper lobe, right lower lobe and left upper lobe pneumonia. Right lower lobe pneumonia is incompletely imaged. CTA head: 1. No intracranial large vessel occlusion or proximal high-grade arterial stenosis. 2. 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 Electronically Signed   By: Kellie Simmering DO   On: 12/20/2020 19:57   DG Chest 2 View  Result Date: 12/20/2020 CLINICAL DATA:  Dyspnea, fatigue, congestive heart failure EXAM: CHEST - 2 VIEW COMPARISON:  12/22/2019 FINDINGS: There is focal consolidation within the posterior right upper lobe, likely infectious in the acute setting. The chin overlies the lung apices, however, no definite pneumothorax. No pleural effusion. Cardiac size is mildly enlarged. Coronary artery bypass grafting has been performed. The pulmonary vascularity is normal. No acute bone  abnormality. IMPRESSION: Right upper lobe consolidation in keeping with acute lobar pneumonia in the appropriate clinical setting. Follow-up chest radiograph is recommended in 3-4 weeks to document resolution and exclude the presence of a central obstructing lesion. Electronically Signed   By: Cassandria Anger  Christa See MD   On: 12/20/2020 16:32   CT HEAD WO CONTRAST  Result Date: 12/21/2020 CLINICAL DATA:  Subdural hemorrhage.  Stroke follow-up EXAM: CT HEAD WITHOUT CONTRAST TECHNIQUE: Contiguous axial images were obtained from the base of the skull through the vertex without intravenous contrast. COMPARISON:  Brain MRI from earlier today FINDINGS: Brain: High-density subdural hematoma along the falx measuring up to 8 mm in thickness, non progressed. No associated significant mass effect. Extensive acute and chronic ischemia as characterized by MRI earlier the same day. No hydrocephalus or shift. Vascular: Negative Skull: Unremarkable remote left craniotomy. Sinuses/Orbits: Bilateral cataract resection IMPRESSION: 1. No interval progression of the inter hemispheric subdural hematoma which measures up to 8 mm in thickness. 2. Acute and chronic ischemia, reference preceding brain MRI. Electronically Signed   By: Monte Fantasia M.D.   On: 12/21/2020 05:01   CT Angio Neck W and/or Wo Contrast  Result Date: 12/20/2020 CLINICAL DATA:  Neuro deficit, acute, stroke suspected; history of intracerebral aneurysm, headache for several days, multiple falls today, pain in neck, rule out bleed or aneurysmal change. EXAM: CT ANGIOGRAPHY HEAD AND NECK TECHNIQUE: Multidetector CT imaging of the head and neck was performed using the standard protocol during bolus administration of intravenous contrast. Multiplanar CT image reconstructions and MIPs were obtained to evaluate the vascular anatomy. Carotid stenosis measurements (when applicable) are obtained utilizing NASCET criteria, using the distal internal carotid diameter as the  denominator. CONTRAST:  18mL OMNIPAQUE IOHEXOL 350 MG/ML SOLN COMPARISON:  Brain MRI 11/25/2019. CT angiogram head/neck 11/25/2019. FINDINGS: CT HEAD FINDINGS Brain: Mild cerebral and cerebellar atrophy. Acute parafalcine subdural hematoma measuring up to 10 mm in greatest thickness (series 5, image 11). Unchanged size of a thin chronic subdural hematoma overlying the left cerebral hemisphere, measuring up to 4 mm, deep to the left-sided cranioplasty. Subacute infarct within the left thalamus. Additional known small subacute infarcts within the supratentorial and infratentorial brain were better appreciated on the prior brain MRI of 11/25/2019. 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 Advanced patchy and ill-defined hypoattenuation within the cerebral white matter, nonspecific but compatible with chronic small vessel ischemic disease. No acute demarcated cortical infarct. No evidence of intracranial mass. No midline shift. Vascular: No hyperdense vessel.  Atherosclerotic calcifications Skull: No calvarial fracture.  Left-sided cranioplasty. Sinuses: Scattered trace mucosal thickening and fluid within the bilateral ethmoid air cells. Small right maxillary sinus mucous retention cyst. Orbits: No mass or acute finding. Review of the MIP images confirms the above findings CTA NECK FINDINGS Aortic arch: Standard aortic branching. Atherosclerotic plaque within the visualized aortic arch and proximal major branch vessels of the neck. No hemodynamically significant innominate or proximal subclavian artery stenosis. Right carotid system: CCA and ICA patent within the neck without significant stenosis (50% or greater). Mild calcified plaque within the carotid bifurcation and proximal ICA. Left carotid system: CCA and ICA patent within the neck without significant stenosis (50% or greater). Mild soft and calcified plaque within the carotid bifurcation and proximal ICA. Vertebral arteries: Codominant  patent within the neck without stenosis. Mild nonstenotic calcified plaque at the origin of the right vertebral artery. Skeleton: No acute bony abnormality or aggressive osseous lesion. Cervical spondylosis. Cervical levocurvature with partially imaged thoracic dextrocurvature. Other neck: Neck mass or cervical lymphadenopathy. Upper chest: Consolidation within portions of the right upper lobe and within imaged portions of the superior right lower lobe compatible with pneumonia. A small ill-defined opacity also present within the left lung apex likely reflecting an  additional site of pneumonia. Prior median sternotomy. Review of the MIP images confirms the above findings CTA HEAD FINDINGS Anterior circulation: The intracranial internal carotid arteries are patent. Plaque within both vessels without stenosis. Unchanged 2 mm inferomedially projecting aneurysm arising from the supraclinoid left ICA (series 12, image 122). The M1 middle cerebral arteries are patent. No M2 proximal branch occlusion or high-grade proximal stenosis is identified. The anterior cerebral arteries are patent. Posterior circulation: The intracranial vertebral arteries are patent. The basilar artery is patent. The posterior cerebral arteries are patent. Posterior communicating arteries are hypoplastic or absent bilaterally. Venous sinuses: Within the limitations of contrast timing, no convincing thrombus. Anatomic variants: As described Review of the MIP images confirms the above findings Acute CT head findings called by telephone at the time of interpretation on 12/20/2020 at 7:55 pm to provider [facility] , who verbally acknowledged these results. IMPRESSION: CT head: 1. Acute parafalcine subdural hematoma measuring up to 10 mm in greatest thickness. 2. Unchanged thin chronic subdural hematoma overlying the left cerebral hemisphere, deep to the left-sided cranioplasty (4 mm in thickness). 3. Subacute infarct within the left thalamus.  Additional known small subacute infarcts within the supratentorial and infratentorial brain were better appreciated on the prior brain MRI of 11/25/2019. 4. 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 5. Stable parenchymal atrophy and severe cerebral white matter chronic small vessel ischemic disease. 6. Mild paranasal sinus disease, as described. CTA neck: 1. The common carotid, internal carotid and vertebral arteries are patent within the neck without hemodynamically significant stenosis. Atherosclerotic disease, as described. 2. Findings compatible right upper lobe, right lower lobe and left upper lobe pneumonia. Right lower lobe pneumonia is incompletely imaged. CTA head: 1. No intracranial large vessel occlusion or proximal high-grade arterial stenosis. 2. 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 Electronically Signed   By: Kellie Simmering DO   On: 12/20/2020 19:57   MR BRAIN WO CONTRAST  Result Date: 12/21/2020 CLINICAL DATA:  Stroke symptoms and parafalcine subdural hematoma EXAM: MRI HEAD WITHOUT CONTRAST TECHNIQUE: Multiplanar, multiecho pulse sequences of the brain and surrounding structures were obtained without intravenous contrast. COMPARISON:  Head CT 12/20/2020 FINDINGS: Brain: There is multifocal acute ischemia throughout both cerebral and cerebellar hemispheres. The pattern is most suggestive watershed infarcts. The subdural hematoma along the falx cerebri is unchanged. Hyperintense T2-weighted signal is moderately widespread throughout the white matter. Generalized volume loss without a clear lobar predilection. The midline structures are normal. Vascular: Major flow voids are preserved. Skull and upper cervical spine: Remote left pterional craniotomy. Sinuses/Orbits:No paranasal sinus fluid levels or advanced mucosal thickening. No mastoid or middle ear effusion. Normal orbits. IMPRESSION: 1. Multifocal acute ischemia  throughout both cerebral and cerebellar hemispheres, in a pattern most suggestive of watershed infarcts. 2. Unchanged subdural hematoma along the falx cerebri. Electronically Signed   By: Ulyses Jarred M.D.   On: 12/21/2020 00:55   CT CHEST ABDOMEN PELVIS W CONTRAST  Result Date: 12/21/2020 CLINICAL DATA:  Multiple falls yesterday EXAM: CT CHEST, ABDOMEN, AND PELVIS WITH CONTRAST TECHNIQUE: Multidetector CT imaging of the chest, abdomen and pelvis was performed following the standard protocol during bolus administration of intravenous contrast. CONTRAST:  173mL OMNIPAQUE IOHEXOL 300 MG/ML  SOLN COMPARISON:  12/20/2020, 02/22/2020 FINDINGS: CT CHEST FINDINGS Cardiovascular: The heart and great vessels are unremarkable with no pericardial effusion. Dense calcification of the mitral annulus. Extensive atherosclerosis of the native coronary vessels, with evidence of prior  CABG. No evidence of thoracic aortic aneurysm or dissection. Moderate atherosclerosis of the aortic arch. Mediastinum/Nodes: No enlarged mediastinal, hilar, or axillary lymph nodes. Thyroid gland, trachea, and esophagus demonstrate no significant findings. Lungs/Pleura: There is a 6 mm right upper lobe pulmonary nodule reference image 29/6, and a 5 mm right middle lobe pulmonary nodule reference image 39/6. These are unchanged since 2020 and can be considered benign. There is dense consolidation within the right upper lobe and superior segment right lower lobe with air bronchograms, compatible with pneumonia. Nodular area of consolidation within the superior segment left lower lobe image 30/6 measures 9 mm, and may reflect an additional focus of infection though follow-up CT will be needed to assess resolution. No effusion or pneumothorax.  The central airways are patent. Musculoskeletal: There are no acute or destructive bony lesions. Reconstructed images demonstrate no additional findings. CT ABDOMEN PELVIS FINDINGS Hepatobiliary: High attenuation  material within the gallbladder likely reflects vicarious excretion of previously administered contrast. No evidence of cholelithiasis or cholecystitis. Liver is unremarkable. Pancreas: Unremarkable. No pancreatic ductal dilatation or surrounding inflammatory changes. Spleen: Borderline splenomegaly measuring 12.1 cm in anterior-posterior dimension, stable. No focal abnormalities. Adrenals/Urinary Tract: Stable hypodensities right kidney compatible with small cysts. No urinary tract calculi or obstructive uropathy. Excreted contrast is seen within the urinary bladder, with no filling defects. The adrenals are unremarkable. Stomach/Bowel: No bowel obstruction or ileus. Diffuse diverticulosis of the distal colon without diverticulitis. No bowel wall thickening or inflammatory change. Vascular/Lymphatic: Aortic atherosclerosis. No enlarged abdominal or pelvic lymph nodes. Reproductive: Status post hysterectomy. No adnexal masses. Other: No free fluid or free gas.  No abdominal wall hernia. Musculoskeletal: No acute or destructive bony lesions. Reconstructed images demonstrate no additional findings. IMPRESSION: 1. No acute intrathoracic, intra-abdominal, or intrapelvic trauma. 2. Multifocal pneumonia, most pronounced in the right upper and right lower lobes. 3. Nodular consolidation within the superior segment left lower lobe measuring 9 mm, favor infection. CT follow-up will be needed after appropriate medical management to document resolution. 4. Benign subcentimeter nodules within the right upper and right middle lobes. 5. Borderline splenomegaly. 6.  Aortic Atherosclerosis (ICD10-I70.0). Electronically Signed   By: Sharlet Salina M.D.   On: 12/21/2020 15:34   DG Knee Complete 4 Views Left  Result Date: 12/20/2020 CLINICAL DATA:  Pain following fall EXAM: LEFT KNEE - COMPLETE 4+ VIEW COMPARISON:  None. FINDINGS: Frontal, lateral, and bilateral oblique views were obtained. There is no fracture or dislocation. No  joint effusion. There is moderate narrowing medially and in the patellofemoral joint region. There is spurring in all compartments. No erosive changes. There are foci of arterial vascular calcification. 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 No fracture, dislocation, or effusion. Atherosclerotic arterial vascular calcifications noted. Electronically Signed   By: Bretta Bang III M.D.   On: 12/20/2020 16:28   DG Knee Complete 4 Views Right  Result Date: 12/20/2020 CLINICAL DATA:  Pain following fall EXAM: RIGHT KNEE - COMPLETE 4+ VIEW COMPARISON:  None. FINDINGS: Frontal, lateral, and bilateral oblique views were obtained. No appreciable fracture or dislocation. No joint effusion. There is moderate narrowing of the patellofemoral joint. There is mild narrowing medially. There is spurring in all compartments. No erosive change. There are multiple foci of arterial vascular calcification. Surgical clips are noted medial to the proximal tibia. IMPRESSION: Osteoarthritic change early, primarily in the patellofemoral joint and medial compartments. No fracture, dislocation, or effusion. Foci of atherosclerotic arterial vascular calcification noted. Electronically Signed  By: Lowella Grip III M.D.   On: 12/20/2020 16:29   ECHOCARDIOGRAM COMPLETE BUBBLE STUDY  Result Date: 12/21/2020    ECHOCARDIOGRAM REPORT   Patient Name:   Yolanda Flowers Date of Exam: 12/21/2020 Medical Rec #:  [id number]      Height:       66.0 in Accession #:    [id number]     Weight:       170.0 lb Date of Birth:  28-Feb-1941     BSA:          1.866 m Patient Age:    80 years       BP:           125/73 mmHg Patient Gender: F              HR:           71 bpm. Exam Location:  Inpatient Procedure: 2D Echo, Cardiac Doppler, Color Doppler and Saline Contrast Bubble            Study Indications:    CVA  History:        Patient has prior history of Echocardiogram examinations, most                  recent 11/25/2019. CAD, Stroke and COPD, Signs/Symptoms:Shortness                 of Breath; Risk Factors:Hypertension and Dyslipidemia.  Sonographer:    [place] Referring Phys: [id number] [place]  1. Agitated saline contrast bubble study was negative, with no evidence of any interatrial shunt.  2. Left ventricular ejection fraction, by estimation, is 65 to 70%. The left ventricle has normal function. The left ventricle has no regional wall motion abnormalities. Left ventricular diastolic parameters are consistent with Grade I diastolic dysfunction (impaired relaxation).  3. Right ventricular systolic function is normal. The right ventricular size is normal. There is normal pulmonary artery systolic pressure. The estimated right ventricular systolic pressure is 27.0 mmHg.  4. Left atrial size was mildly dilated.  5. The mitral valve is abnormal. Trivial mitral valve regurgitation. The mean mitral valve gradient is 3.0 mmHg with average heart rate of 72 bpm. Moderate mitral annular calcification. Calcified papillary muscle noted, not as well seen in prior study.  6. The aortic valve is tricuspid. Aortic valve regurgitation is not visualized. Aortic valve sclerosis is present, with no evidence of aortic valve stenosis. Comparison(s): A prior study was performed on 11/25/19. No significant change from prior study. Prior images reviewed side by side. FINDINGS  Left Ventricle: Left ventricular ejection fraction, by estimation, is 65 to 70%. The left ventricle has normal function. The left ventricle has no regional wall motion abnormalities. The left ventricular internal cavity size was normal in size. There is  no left ventricular hypertrophy of the basal-septal segment. Left ventricular diastolic parameters are consistent with Grade I diastolic dysfunction (impaired relaxation). Right Ventricle: The right ventricular size is normal. No increase in right ventricular wall thickness. Right  ventricular systolic function is normal. There is normal pulmonary artery systolic pressure. The tricuspid regurgitant velocity is 2.84 m/s, and  with an assumed right atrial pressure of 3 mmHg, the estimated right ventricular systolic pressure is 35.0 mmHg. Left Atrium: Left atrial size was mildly dilated. Right Atrium: Right atrial size was normal in size. Pericardium: Trivial pericardial effusion is present. Mitral Valve: Calcified papillary muscle noted. The mitral valve is abnormal. There is moderate calcification  of the mitral valve leaflet(s). Moderate mitral annular calcification. Trivial mitral valve regurgitation. MV peak gradient, 7.8 mmHg. The mean mitral valve gradient is 3.0 mmHg with average heart rate of 72 bpm. Tricuspid Valve: The tricuspid valve is grossly normal. Tricuspid valve regurgitation is mild. Aortic Valve: The aortic valve is tricuspid. Aortic valve regurgitation is not visualized. Mild aortic valve sclerosis is present, with no evidence of aortic valve stenosis. Aortic valve mean gradient measures 7.0 mmHg. Aortic valve peak gradient measures 14.2 mmHg. Aortic valve area, by VTI measures 2.04 cm. Pulmonic Valve: The pulmonic valve was grossly normal. Pulmonic valve regurgitation is not visualized. No evidence of pulmonic stenosis. Aorta: The aortic root and ascending aorta are structurally normal, with no evidence of dilitation. IAS/Shunts: The atrial septum is grossly normal. Agitated saline contrast was given intravenously to evaluate for intracardiac shunting. Agitated saline contrast bubble study was negative, with no evidence of any interatrial shunt.  LEFT VENTRICLE PLAX 2D LVIDd:         5.00 cm     Diastology LVIDs:         1.70 cm     LV e' medial:    4.79 cm/s LV PW:         0.80 cm     LV E/e' medial:  15.3 LV IVS:        0.90 cm     LV e' lateral:   7.94 cm/s LVOT diam:     1.80 cm     LV E/e' lateral: 9.2 LV SV:         72 LV SV Index:   38 LVOT Area:     2.54 cm  LV  Volumes (MOD) LV vol d, MOD A2C: 29.3 ml LV vol d, MOD A4C: 36.6 ml LV vol s, MOD A2C: 8.6 ml LV vol s, MOD A4C: 15.8 ml LV SV MOD A2C:     20.7 ml LV SV MOD A4C:     36.6 ml LV SV MOD BP:      20.5 ml RIGHT VENTRICLE RV S prime:     16.00 cm/s  PULMONARY VEINS TAPSE (M-mode): 1.8 cm      A Reversal Duration: 116.00 msec                             A Reversal Velocity: 33.20 cm/s                             Diastolic Velocity:  0000000 cm/s                             S/D Velocity:        1.50                             Systolic Velocity:   123XX123 cm/s LEFT ATRIUM             Index       RIGHT ATRIUM           Index LA diam:        3.70 cm 1.98 cm/m  RA Area:     16.10 cm LA Vol (A2C):   77.2 ml 41.36 ml/m RA Volume:   38.80 ml  20.79 ml/m LA Vol (A4C):   48.1 ml 25.77  ml/m LA Biplane Vol: 65.3 ml 34.99 ml/m  AORTIC VALVE                    PULMONIC VALVE AV Area (Vmax):    2.24 cm     PV Vmax:       0.88 m/s AV Area (Vmean):   2.19 cm     PV Vmean:      64.500 cm/s AV Area (VTI):     2.04 cm     PV VTI:        0.179 m AV Vmax:           188.50 cm/s  PV Peak grad:  3.1 mmHg AV Vmean:          119.500 cm/s PV Mean grad:  2.0 mmHg AV VTI:            0.351 m AV Peak Grad:      14.2 mmHg AV Mean Grad:      7.0 mmHg LVOT Vmax:         166.00 cm/s LVOT Vmean:        103.000 cm/s LVOT VTI:          0.281 m LVOT/AV VTI ratio: 0.80  AORTA Ao Root diam: 3.50 cm Ao Asc diam:  3.20 cm MITRAL VALVE                TRICUSPID VALVE MV Area (PHT): 1.62 cm     TR Peak grad:   32.3 mmHg MV Area VTI:   2.03 cm     TR Vmax:        284.00 cm/s MV Peak grad:  7.8 mmHg MV Mean grad:  3.0 mmHg     SHUNTS MV Vmax:       1.40 m/s     Systemic VTI:  0.28 m MV Vmean:      73.7 cm/s    Systemic Diam: 1.80 cm MV Decel Time: 468 msec MR Peak grad: 43.8 mmHg MR Vmax:      331.00 cm/s MV E velocity: 73.30 cm/s MV A velocity: 127.00 cm/s MV E/A ratio:  0.58 Rudean Haskell MD Electronically signed by Rudean Haskell MD Signature  Date/Time: 12/21/2020/11:24:53 AM    Final        Medical Problem List and Plan: 1.  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  Antithrombotics: -DVT/anticoagulation: SCDs  -antiplatelet therapy: N/A 3. Pain Management: Tramadol as needed 4. Mood: Provide emotional support  -antipsychotic agents: N/A 5. Neuropsych: This patient is capable of making decisions on her own behalf. 6. Skin/Wound Care: Routine skin checks 7. Fluids/Electrolytes/Nutrition: Routine in and outs with follow-up chemistries 8.  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  No chest pain or shortness of breath 9.  History of chronic thrombocytopenia/leukocytosis.  Follow-up hematology services Dr. Hinton Rao 10.  Hyperlipidemia.  Lipitor 11.  COPD.  Continue inhalers as directed.  Check oxygen saturations every shift 12.  CAP.  Continue course of Rocephin/Zithromax 13.  GERD.  Protonix 14.  Hypertension.  Lopressor 25 mg twice daily ***  Cathlyn Parsons, PA-C 12/22/2020

## 2020-12-22 NOTE — Progress Notes (Signed)
[name], female   DOB: 1941-07-01, 80 y.o.   MRN: [id number]  PROGRESS NOTE    Yolanda Flowers  CLE:[id number] DOB: [birth year] DOA: 12/20/2020 PCP: [name], MD   Brief Narrative:  80 year old female with history of essential thrombocytosis currently no longer on hydroxyurea, chronic leukocytosis, chronic subdural hematoma (left-sided since 2008), melanoma, vulvar intraepithelial neoplasia, GERD, CAD status post CABG in 3/21, hypertension, COPD, hyperlipidemia and ischemic stroke (thought to be cardioembolic but source never identified-in 11/2019) presented with with fall.  On presentation, CT of the brain showed subdural hematoma measuring 10 mm in thickness.  Neurosurgery recommended conservative management and repeat CT of the brain in the morning.  She was also found to have evidence of multilobar pneumonia.  COVID-19 test was negative.  She was started on IV antibiotics.  She was found to have scattered embolic lacunar infarcts on MRI of the brain.  Neurology was consulted.  Repeat CT of the brain was negative for worsening of the subdural bleeding.  Neurosurgery signed off.  She is not a candidate for any antiplatelets or anticoagulation because of subdural hematoma and neurology recommended outpatient follow-up.  PT recommended CIR placement  Assessment & Plan:   Subdural hematoma Fall at home -CT on presentation showed a small acute parafalcine subdural hematoma.  This is superimposed on patient's longstanding history of chronic subdural hematoma since 2008. -Repeat CT of the brain did not show worsening of subdural hematoma.  Neurosurgery recommended conservative management and signed off.  Outpatient follow-up with neurosurgery if needed  Acute cardioembolic stroke -MRI of the brain showed scattered embolic lacunar infarcts -Neurology evaluated the patient: She is not a candidate for anticoagulation or antiplatelets because of subdural hematoma.  Dose of Lipitor has  been decreased to 40 mg daily by neurology. -CTA head and neck showed no significant stenosis -2D echo with bubble showed EF of 65 to 70%, left atrium mildly dilated -CT chest/abdomen and pelvis was ordered by neurology to rule out malignancy: There was no evidence of malignancy on the CT -LDL 25.  A1c 5.8 -PT recommended CIR placement.    CIR has been consulted diet as per SLP recommendations  Community-acquired multilobar bacterial pneumonia -COVID-19 testing was negative on presentation.  Currently on Rocephin and Zithromax. Currently on room air  ?  MDS Leukocytosis Thrombocytopenia Anemia of chronic disease: Possibly from above and other chronic illnesses -History of thrombocytosis with recent chronic thrombocytopenia and leukocytosis with concern for MDS.  Bone marrow biopsy planned as an outpatient soon. -Hemoglobin stable currently  CAD -Chest pain-free.  Continue statin.  Cannot continue antiplatelet because of subdural hematoma.  Outpatient follow-up with cardiology  GERD -Continue PPI  Hyperlipidemia -Statin plan as above   DVT prophylaxis: SCDs Code Status: DNR Family Communication: None at bedside Disposition Plan: Status is: Inpatient  Remains inpatient appropriate because:Inpatient level of care appropriate due to severity of illness   Dispo: The patient is from: Home              Anticipated d/c is to: CIR              Patient currently is medically stable to d/c.   Difficult to place patient No  Consultants: Neurosurgery/neurology  Procedures: Echo  Antimicrobials: Rocephin and Zithromax   Subjective: Patient seen and examined at bedside.  Denies any worsening shortness of breath.  Complains of some mild cough but getting better.  No overnight fever or vomiting.  Objective: Vitals:   12/21/20  [place] 12/21/20 1953 12/21/20 2337 12/22/20 0410  BP: 113/63 117/69 109/61 98/67  Pulse: 75 71 72 69  Resp: (!) '22 17 18 17  ' Temp:  98.9 F (37.2 C)  99.1 F (37.3 C) 98.1 F (36.7 C)  TempSrc:  Oral Oral Oral  SpO2: 93% 96% 93% 92%  Weight:      Height:        Intake/Output Summary (Last 24 hours) at 12/22/2020 0924 Last data filed at 12/22/2020 8032 Gross per 24 hour  Intake 570 ml  Output --  Net 570 ml   Filed Weights   12/20/20 1428  Weight: 77.1 kg    Examination:  General exam: Appears calm and comfortable.  Elderly female lying in bed.  Currently on room air. Respiratory system: Bilateral decreased breath sounds at bases with some scattered crackles Cardiovascular system: S1 & S2 heard, Rate controlled Gastrointestinal system: Abdomen is nondistended, soft and nontender. Normal bowel sounds heard. Extremities: No cyanosis, clubbing, edema  Central nervous system: Alert and oriented. No focal neurological deficits. Moving extremities Skin: No rashes, lesions or ulcers Psychiatry: Judgement and insight appear normal. Mood & affect appropriate.     Data Reviewed: I have personally reviewed following labs and imaging studies  CBC: Recent Labs  Lab 12/18/20 0000 12/20/20 1525 12/21/20 0236  WBC 24.2 28.2* 23.2*  NEUTROABS 19.36 25.7* 20.4*  HGB 10.6* 10.3* 9.5*  HCT 33* 33.8* 31.5*  MCV 96 100.0 102.3*  PLT 153 172 122   Basic Metabolic Panel: Recent Labs  Lab 12/18/20 0000 12/20/20 1525 12/21/20 0236  NA 139 138 138  K 4.4 4.5 4.3  CL 104 104 106  CO2 28* 23 22  GLUCOSE  --  99 97  BUN '16 15 11  ' CREATININE 1.0 0.96 0.94  CALCIUM 8.8 9.0 8.5*   GFR: Estimated Creatinine Clearance: 50 mL/min (by C-G formula based on SCr of 0.94 mg/dL). Liver Function Tests: Recent Labs  Lab 12/18/20 0000 12/20/20 1525 12/21/20 0236  AST 26 17 13*  ALT '11 11 9  ' ALKPHOS 105 73 66  BILITOT  --  0.8 0.3  PROT  --  7.6 6.8  ALBUMIN 4.2 3.3* 3.0*   No results for input(s): LIPASE, AMYLASE in the last 168 hours. No results for input(s): AMMONIA in the last 168 hours. Coagulation Profile: Recent Labs  Lab  12/20/20 1525 12/21/20 0236  INR 1.2 1.3*   Cardiac Enzymes: Recent Labs  Lab 12/20/20 1525  CKTOTAL 115   BNP (last 3 results) No results for input(s): PROBNP in the last 8760 hours. HbA1C: Recent Labs    12/21/20 0236  HGBA1C 5.8*   CBG: No results for input(s): GLUCAP in the last 168 hours. Lipid Profile: Recent Labs    12/21/20 0236  CHOL 74  HDL 28*  LDLCALC 25  TRIG 104  CHOLHDL 2.6   Thyroid Function Tests: Recent Labs    12/20/20 1525  TSH 1.211   Anemia Panel: Recent Labs    12/21/20 0000  FOLATE >20   Sepsis Labs: Recent Labs  Lab 12/20/20 1644 12/20/20 1815 12/21/20 0236  PROCALCITON  --   --  <0.10  LATICACIDVEN 1.2 1.1  --     Recent Results (from the past 240 hour(s))  Blood culture (routine x 2)     Status: None (Preliminary result)   Collection Time: 12/20/20  4:44 PM   Specimen: BLOOD  Result Value Ref Range Status   Specimen Description BLOOD SITE NOT SPECIFIED  Final   Special Requests   Final    BOTTLES DRAWN AEROBIC AND ANAEROBIC Blood Culture results may not be optimal due to an inadequate volume of blood received in culture bottles   Culture   Final    NO GROWTH 2 DAYS Performed at [facility], [place] [address] [place], [place] [address]    Report Status PENDING  Incomplete  Blood culture (routine x 2)     Status: None (Preliminary result)   Collection Time: 12/20/20  4:44 PM   Specimen: BLOOD  Result Value Ref Range Status   Specimen Description BLOOD SITE NOT SPECIFIED  Final   Special Requests   Final    BOTTLES DRAWN AEROBIC AND ANAEROBIC Blood Culture results may not be optimal due to an inadequate volume of blood received in culture bottles   Culture   Final    NO GROWTH 2 DAYS Performed at [facility], [place] [address] [place],  [address]    Report Status PENDING  Incomplete  Resp Panel by RT-PCR (Flu A&B, Covid) Nasopharyngeal Swab     Status: None   Collection Time: 12/20/20  4:44 PM    Specimen: Nasopharyngeal Swab; Nasopharyngeal(NP) swabs in vial transport medium  Result Value Ref Range Status   SARS Coronavirus 2 by RT PCR NEGATIVE NEGATIVE Final    Comment: (NOTE) SARS-CoV-2 target nucleic acids are NOT DETECTED.  The SARS-CoV-2 RNA is generally detectable in upper respiratory specimens during the acute phase of infection. The lowest concentration of SARS-CoV-2 viral copies this assay can detect is 138 copies/mL. A negative result does not preclude SARS-Cov-2 infection and should not be used as the sole basis for treatment or other patient management decisions. A negative result may occur with  improper specimen collection/handling, submission of specimen other than nasopharyngeal swab, presence of viral mutation(s) within the areas targeted by this assay, and inadequate number of viral copies(<138 copies/mL). A negative result must be combined with clinical observations, patient history, and epidemiological information. The expected result is Negative.  Fact Sheet for Patients:  EntrepreneurPulse.com.au  Fact Sheet for Healthcare Providers:  IncredibleEmployment.be  This test is no t yet approved or cleared by the [place] FDA and  has been authorized for detection and/or diagnosis of SARS-CoV-2 by FDA under an Emergency Use Authorization (EUA). This EUA will remain  in effect (meaning this test can be used) for the duration of the COVID-19 declaration under Section 564(b)(1) of the Act, 21 U.S.C.section 360bbb-3(b)(1), unless the authorization is terminated  or revoked sooner.       Influenza A by PCR NEGATIVE NEGATIVE Final   Influenza B by PCR NEGATIVE NEGATIVE Final    Comment: (NOTE) The Xpert Xpress SARS-CoV-2/FLU/RSV plus assay is intended as an aid in the diagnosis of influenza from Nasopharyngeal swab specimens and should not be used as a sole basis for treatment. Nasal washings and aspirates are  unacceptable for Xpert Xpress SARS-CoV-2/FLU/RSV testing.  Fact Sheet for Patients: EntrepreneurPulse.com.au  Fact Sheet for Healthcare Providers: IncredibleEmployment.be  This test is not yet approved or cleared by the [place] FDA and has been authorized for detection and/or diagnosis of SARS-CoV-2 by FDA under an Emergency Use Authorization (EUA). This EUA will remain in effect (meaning this test can be used) for the duration of the COVID-19 declaration under Section 564(b)(1) of the Act, 21 U.S.C. section 360bbb-3(b)(1), unless the authorization is terminated or revoked.  Performed at [facility], [place] [address]  [place], [place] [address]   Urine culture     Status: Abnormal   Collection Time: 12/21/20 12:01 AM   Specimen: Urine, Random  Result Value Ref Range Status   Specimen Description URINE, RANDOM  Final   Special Requests NONE  Final   Culture (A)  Final    <10,000 COLONIES/mL INSIGNIFICANT GROWTH Performed at [facility], [place] [address], [place], [place] [address]    Report Status 12/22/2020 FINAL  Final         Radiology Studies: CT Angio Head W or Wo Contrast  Result Date: 12/20/2020 CLINICAL DATA:  Neuro deficit, acute, stroke suspected; history of intracerebral aneurysm, headache for several days, multiple falls today, pain in neck, rule out bleed or aneurysmal change. EXAM: CT ANGIOGRAPHY HEAD AND NECK TECHNIQUE: Multidetector CT imaging of the head and neck was performed using the standard protocol during bolus administration of intravenous contrast. Multiplanar CT image reconstructions and MIPs were obtained to evaluate the vascular anatomy. Carotid stenosis measurements (when applicable) are obtained utilizing NASCET criteria, using the distal internal carotid diameter as the denominator. CONTRAST:  59m OMNIPAQUE IOHEXOL 350 MG/ML SOLN COMPARISON:  Brain MRI 11/25/2019. CT angiogram head/neck  11/25/2019. FINDINGS: CT HEAD FINDINGS Brain: Mild cerebral and cerebellar atrophy. Acute parafalcine subdural hematoma measuring up to 10 mm in greatest thickness (series 5, image 11). Unchanged size of a thin chronic subdural hematoma overlying the left cerebral hemisphere, measuring up to 4 mm, deep to the left-sided cranioplasty. Subacute infarct within the left thalamus. Additional known small subacute infarcts within the supratentorial and infratentorial brain were better appreciated on the prior brain MRI of 11/25/2019. 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 Advanced patchy and ill-defined hypoattenuation within the cerebral white matter, nonspecific but compatible with chronic small vessel ischemic disease. No acute demarcated cortical infarct. No evidence of intracranial mass. No midline shift. Vascular: No hyperdense vessel.  Atherosclerotic calcifications Skull: No calvarial fracture.  Left-sided cranioplasty. Sinuses: Scattered trace mucosal thickening and fluid within the bilateral ethmoid air cells. Small right maxillary sinus mucous retention cyst. Orbits: No mass or acute finding. Review of the MIP images confirms the above findings CTA NECK FINDINGS Aortic arch: Standard aortic branching. Atherosclerotic plaque within the visualized aortic arch and proximal major branch vessels of the neck. No hemodynamically significant innominate or proximal subclavian artery stenosis. Right carotid system: CCA and ICA patent within the neck without significant stenosis (50% or greater). Mild calcified plaque within the carotid bifurcation and proximal ICA. Left carotid system: CCA and ICA patent within the neck without significant stenosis (50% or greater). Mild soft and calcified plaque within the carotid bifurcation and proximal ICA. Vertebral arteries: Codominant patent within the neck without stenosis. Mild nonstenotic calcified plaque at the origin of the right vertebral artery.  Skeleton: No acute bony abnormality or aggressive osseous lesion. Cervical spondylosis. Cervical levocurvature with partially imaged thoracic dextrocurvature. Other neck: Neck mass or cervical lymphadenopathy. Upper chest: Consolidation within portions of the right upper lobe and within imaged portions of the superior right lower lobe compatible with pneumonia. A small ill-defined opacity also present within the left lung apex likely reflecting an additional site of pneumonia. Prior median sternotomy. Review of the MIP images confirms the above findings CTA HEAD FINDINGS Anterior circulation: The intracranial internal carotid arteries are patent. Plaque within both vessels without stenosis. Unchanged 2 mm inferomedially projecting aneurysm arising from the supraclinoid left ICA (series 12, image 122). The M1 middle cerebral arteries are patent. No  M2 proximal branch occlusion or high-grade proximal stenosis is identified. The anterior cerebral arteries are patent. Posterior circulation: The intracranial vertebral arteries are patent. The basilar artery is patent. The posterior cerebral arteries are patent. Posterior communicating arteries are hypoplastic or absent bilaterally. Venous sinuses: Within the limitations of contrast timing, no convincing thrombus. Anatomic variants: As described Review of the MIP images confirms the above findings Acute CT head findings called by telephone at the time of interpretation on 12/20/2020 at 7:55 pm to provider [facility] , who verbally acknowledged these results. IMPRESSION: CT head: 1. Acute parafalcine subdural hematoma measuring up to 10 mm in greatest thickness. 2. Unchanged thin chronic subdural hematoma overlying the left cerebral hemisphere, deep to the left-sided cranioplasty (4 mm in thickness). 3. Subacute infarct within the left thalamus. Additional known small subacute infarcts within the supratentorial and infratentorial brain were better appreciated on the  prior brain MRI of 11/25/2019. 4. 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 5. Stable parenchymal atrophy and severe cerebral white matter chronic small vessel ischemic disease. 6. Mild paranasal sinus disease, as described. CTA neck: 1. The common carotid, internal carotid and vertebral arteries are patent within the neck without hemodynamically significant stenosis. Atherosclerotic disease, as described. 2. Findings compatible right upper lobe, right lower lobe and left upper lobe pneumonia. Right lower lobe pneumonia is incompletely imaged. CTA head: 1. No intracranial large vessel occlusion or proximal high-grade arterial stenosis. 2. 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 Electronically Signed   By: Kellie Simmering DO   On: 12/20/2020 19:57   DG Chest 2 View  Result Date: 12/20/2020 CLINICAL DATA:  Dyspnea, fatigue, congestive heart failure EXAM: CHEST - 2 VIEW COMPARISON:  12/22/2019 FINDINGS: There is focal consolidation within the posterior right upper lobe, likely infectious in the acute setting. The chin overlies the lung apices, however, no definite pneumothorax. No pleural effusion. Cardiac size is mildly enlarged. Coronary artery bypass grafting has been performed. The pulmonary vascularity is normal. No acute bone abnormality. IMPRESSION: Right upper lobe consolidation in keeping with acute lobar pneumonia in the appropriate clinical setting. Follow-up chest radiograph is recommended in 3-4 weeks to document resolution and exclude the presence of a central obstructing lesion. Electronically Signed   By: Fidela Salisbury MD   On: 12/20/2020 16:32   CT HEAD WO CONTRAST  Result Date: 12/21/2020 CLINICAL DATA:  Subdural hemorrhage.  Stroke follow-up EXAM: CT HEAD WITHOUT CONTRAST TECHNIQUE: Contiguous axial images were obtained from the base of the skull through the vertex without intravenous contrast. COMPARISON:  Brain MRI  from earlier today FINDINGS: Brain: High-density subdural hematoma along the falx measuring up to 8 mm in thickness, non progressed. No associated significant mass effect. Extensive acute and chronic ischemia as characterized by MRI earlier the same day. No hydrocephalus or shift. Vascular: Negative Skull: Unremarkable remote left craniotomy. Sinuses/Orbits: Bilateral cataract resection IMPRESSION: 1. No interval progression of the inter hemispheric subdural hematoma which measures up to 8 mm in thickness. 2. Acute and chronic ischemia, reference preceding brain MRI. Electronically Signed   By: Monte Fantasia M.D.   On: 12/21/2020 05:01   CT Angio Neck W and/or Wo Contrast  Result Date: 12/20/2020 CLINICAL DATA:  Neuro deficit, acute, stroke suspected; history of intracerebral aneurysm, headache for several days, multiple falls today, pain in neck, rule out bleed or aneurysmal change. EXAM: CT ANGIOGRAPHY HEAD AND NECK TECHNIQUE: Multidetector CT imaging of the head and  neck was performed using the standard protocol during bolus administration of intravenous contrast. Multiplanar CT image reconstructions and MIPs were obtained to evaluate the vascular anatomy. Carotid stenosis measurements (when applicable) are obtained utilizing NASCET criteria, using the distal internal carotid diameter as the denominator. CONTRAST:  36m OMNIPAQUE IOHEXOL 350 MG/ML SOLN COMPARISON:  Brain MRI 11/25/2019. CT angiogram head/neck 11/25/2019. FINDINGS: CT HEAD FINDINGS Brain: Mild cerebral and cerebellar atrophy. Acute parafalcine subdural hematoma measuring up to 10 mm in greatest thickness (series 5, image 11). Unchanged size of a thin chronic subdural hematoma overlying the left cerebral hemisphere, measuring up to 4 mm, deep to the left-sided cranioplasty. Subacute infarct within the left thalamus. Additional known small subacute infarcts within the supratentorial and infratentorial brain were better appreciated on the prior  brain MRI of 11/25/2019. 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 Advanced patchy and ill-defined hypoattenuation within the cerebral white matter, nonspecific but compatible with chronic small vessel ischemic disease. No acute demarcated cortical infarct. No evidence of intracranial mass. No midline shift. Vascular: No hyperdense vessel.  Atherosclerotic calcifications Skull: No calvarial fracture.  Left-sided cranioplasty. Sinuses: Scattered trace mucosal thickening and fluid within the bilateral ethmoid air cells. Small right maxillary sinus mucous retention cyst. Orbits: No mass or acute finding. Review of the MIP images confirms the above findings CTA NECK FINDINGS Aortic arch: Standard aortic branching. Atherosclerotic plaque within the visualized aortic arch and proximal major branch vessels of the neck. No hemodynamically significant innominate or proximal subclavian artery stenosis. Right carotid system: CCA and ICA patent within the neck without significant stenosis (50% or greater). Mild calcified plaque within the carotid bifurcation and proximal ICA. Left carotid system: CCA and ICA patent within the neck without significant stenosis (50% or greater). Mild soft and calcified plaque within the carotid bifurcation and proximal ICA. Vertebral arteries: Codominant patent within the neck without stenosis. Mild nonstenotic calcified plaque at the origin of the right vertebral artery. Skeleton: No acute bony abnormality or aggressive osseous lesion. Cervical spondylosis. Cervical levocurvature with partially imaged thoracic dextrocurvature. Other neck: Neck mass or cervical lymphadenopathy. Upper chest: Consolidation within portions of the right upper lobe and within imaged portions of the superior right lower lobe compatible with pneumonia. A small ill-defined opacity also present within the left lung apex likely reflecting an additional site of pneumonia. Prior median sternotomy.  Review of the MIP images confirms the above findings CTA HEAD FINDINGS Anterior circulation: The intracranial internal carotid arteries are patent. Plaque within both vessels without stenosis. Unchanged 2 mm inferomedially projecting aneurysm arising from the supraclinoid left ICA (series 12, image 122). The M1 middle cerebral arteries are patent. No M2 proximal branch occlusion or high-grade proximal stenosis is identified. The anterior cerebral arteries are patent. Posterior circulation: The intracranial vertebral arteries are patent. The basilar artery is patent. The posterior cerebral arteries are patent. Posterior communicating arteries are hypoplastic or absent bilaterally. Venous sinuses: Within the limitations of contrast timing, no convincing thrombus. Anatomic variants: As described Review of the MIP images confirms the above findings Acute CT head findings called by telephone at the time of interpretation on 12/20/2020 at 7:55 pm [facility] who verbally acknowledged these results. IMPRESSION: CT head: 1. Acute parafalcine subdural hematoma measuring up to 10 mm in greatest thickness. 2. Unchanged thin chronic subdural hematoma overlying the left cerebral hemisphere, deep to the left-sided cranioplasty (4 mm in thickness). 3. Subacute infarct within the left thalamus. Additional known small subacute infarcts within the  supratentorial and infratentorial brain were better appreciated on the prior brain MRI of 11/25/2019. 4. 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 5. Stable parenchymal atrophy and severe cerebral white matter chronic small vessel ischemic disease. 6. Mild paranasal sinus disease, as described. CTA neck: 1. The common carotid, internal carotid and vertebral arteries are patent within the neck without hemodynamically significant stenosis. Atherosclerotic disease, as described. 2. Findings compatible right upper lobe, right lower lobe and left  upper lobe pneumonia. Right lower lobe pneumonia is incompletely imaged. CTA head: 1. No intracranial large vessel occlusion or proximal high-grade arterial stenosis. 2. 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 Electronically Signed   By: Kellie Simmering DO   On: 12/20/2020 19:57   MR BRAIN WO CONTRAST  Result Date: 12/21/2020 CLINICAL DATA:  Stroke symptoms and parafalcine subdural hematoma EXAM: MRI HEAD WITHOUT CONTRAST TECHNIQUE: Multiplanar, multiecho pulse sequences of the brain and surrounding structures were obtained without intravenous contrast. COMPARISON:  Head CT 12/20/2020 FINDINGS: Brain: There is multifocal acute ischemia throughout both cerebral and cerebellar hemispheres. The pattern is most suggestive watershed infarcts. The subdural hematoma along the falx cerebri is unchanged. Hyperintense T2-weighted signal is moderately widespread throughout the white matter. Generalized volume loss without a clear lobar predilection. The midline structures are normal. Vascular: Major flow voids are preserved. Skull and upper cervical spine: Remote left pterional craniotomy. Sinuses/Orbits:No paranasal sinus fluid levels or advanced mucosal thickening. No mastoid or middle ear effusion. Normal orbits. IMPRESSION: 1. Multifocal acute ischemia throughout both cerebral and cerebellar hemispheres, in a pattern most suggestive of watershed infarcts. 2. Unchanged subdural hematoma along the falx cerebri. Electronically Signed   By: Ulyses Jarred M.D.   On: 12/21/2020 00:55   CT CHEST ABDOMEN PELVIS W CONTRAST  Result Date: 12/21/2020 CLINICAL DATA:  Multiple falls yesterday EXAM: CT CHEST, ABDOMEN, AND PELVIS WITH CONTRAST TECHNIQUE: Multidetector CT imaging of the chest, abdomen and pelvis was performed following the standard protocol during bolus administration of intravenous contrast. CONTRAST:  127m OMNIPAQUE IOHEXOL 300 MG/ML  SOLN COMPARISON:  12/20/2020,  02/22/2020 FINDINGS: CT CHEST FINDINGS Cardiovascular: The heart and great vessels are unremarkable with no pericardial effusion. Dense calcification of the mitral annulus. Extensive atherosclerosis of the native coronary vessels, with evidence of prior CABG. No evidence of thoracic aortic aneurysm or dissection. Moderate atherosclerosis of the aortic arch. Mediastinum/Nodes: No enlarged mediastinal, hilar, or axillary lymph nodes. Thyroid gland, trachea, and esophagus demonstrate no significant findings. Lungs/Pleura: There is a 6 mm right upper lobe pulmonary nodule reference image 29/6, and a 5 mm right middle lobe pulmonary nodule reference image 39/6. These are unchanged since 2020 and can be considered benign. There is dense consolidation within the right upper lobe and superior segment right lower lobe with air bronchograms, compatible with pneumonia. Nodular area of consolidation within the superior segment left lower lobe image 30/6 measures 9 mm, and may reflect an additional focus of infection though follow-up CT will be needed to assess resolution. No effusion or pneumothorax.  The central airways are patent. Musculoskeletal: There are no acute or destructive bony lesions. Reconstructed images demonstrate no additional findings. CT ABDOMEN PELVIS FINDINGS Hepatobiliary: High attenuation material within the gallbladder likely reflects vicarious excretion of previously administered contrast. No evidence of cholelithiasis or cholecystitis. Liver is unremarkable. Pancreas: Unremarkable. No pancreatic ductal dilatation or surrounding inflammatory changes. Spleen: Borderline splenomegaly measuring 12.1 cm in anterior-posterior dimension, stable. No focal abnormalities. Adrenals/Urinary Tract: Stable  hypodensities right kidney compatible with small cysts. No urinary tract calculi or obstructive uropathy. Excreted contrast is seen within the urinary bladder, with no filling defects. The adrenals are  unremarkable. Stomach/Bowel: No bowel obstruction or ileus. Diffuse diverticulosis of the distal colon without diverticulitis. No bowel wall thickening or inflammatory change. Vascular/Lymphatic: Aortic atherosclerosis. No enlarged abdominal or pelvic lymph nodes. Reproductive: Status post hysterectomy. No adnexal masses. Other: No free fluid or free gas.  No abdominal wall hernia. Musculoskeletal: No acute or destructive bony lesions. Reconstructed images demonstrate no additional findings. IMPRESSION: 1. No acute intrathoracic, intra-abdominal, or intrapelvic trauma. 2. Multifocal pneumonia, most pronounced in the right upper and right lower lobes. 3. Nodular consolidation within the superior segment left lower lobe measuring 9 mm, favor infection. CT follow-up will be needed after appropriate medical management to document resolution. 4. Benign subcentimeter nodules within the right upper and right middle lobes. 5. Borderline splenomegaly. 6.  Aortic Atherosclerosis (ICD10-I70.0). Electronically Signed   By: Randa Ngo M.D.   On: 12/21/2020 15:34   DG Knee Complete 4 Views Left  Result Date: 12/20/2020 CLINICAL DATA:  Pain following fall EXAM: LEFT KNEE - COMPLETE 4+ VIEW COMPARISON:  None. FINDINGS: Frontal, lateral, and bilateral oblique views were obtained. There is no fracture or dislocation. No joint effusion. There is moderate narrowing medially and in the patellofemoral joint region. There is spurring in all compartments. No erosive changes. There are foci of arterial vascular calcification. 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 No fracture, dislocation, or effusion. Atherosclerotic arterial vascular calcifications noted. Electronically Signed   By: Lowella Grip III M.D.   On: 12/20/2020 16:28   DG Knee Complete 4 Views Right  Result Date: 12/20/2020 CLINICAL DATA:  Pain following fall EXAM: RIGHT KNEE - COMPLETE 4+ VIEW COMPARISON:  None.  FINDINGS: Frontal, lateral, and bilateral oblique views were obtained. No appreciable fracture or dislocation. No joint effusion. There is moderate narrowing of the patellofemoral joint. There is mild narrowing medially. There is spurring in all compartments. No erosive change. There are multiple foci of arterial vascular calcification. Surgical clips are noted medial to the proximal tibia. IMPRESSION: Osteoarthritic change early, primarily in the patellofemoral joint and medial compartments. No fracture, dislocation, or effusion. Foci of atherosclerotic arterial vascular calcification noted. Electronically Signed   By: Lowella Grip III M.D.   On: 12/20/2020 16:29   ECHOCARDIOGRAM COMPLETE BUBBLE STUDY  Result Date: 12/21/2020    ECHOCARDIOGRAM REPORT   Patient Name:   LURENA NAEVE Date of Exam: 12/21/2020 Medical Rec #:  [id number]      Height:       66.0 in Accession #:    [id number]     Weight:       170.0 lb Date of Birth:  1941-03-20     BSA:          1.866 m Patient Age:    26 years       BP:           125/73 mmHg Patient Gender: F              HR:           71 bpm. Exam Location:  Inpatient Procedure: 2D Echo, Cardiac Doppler, Color Doppler and Saline Contrast Bubble            Study Indications:    CVA  History:        Patient has prior history of Echocardiogram examinations, most  recent 11/25/2019. CAD, Stroke and COPD, Signs/Symptoms:Shortness                 of Breath; Risk Factors:Hypertension and Dyslipidemia.  Sonographer:    Heil Referring Phys: [id number] [place]  1. Agitated saline contrast bubble study was negative, with no evidence of any interatrial shunt.  2. Left ventricular ejection fraction, by estimation, is 65 to 70%. The left ventricle has normal function. The left ventricle has no regional wall motion abnormalities. Left ventricular diastolic parameters are consistent with Grade I diastolic dysfunction (impaired relaxation).  3. Right  ventricular systolic function is normal. The right ventricular size is normal. There is normal pulmonary artery systolic pressure. The estimated right ventricular systolic pressure is 74.0 mmHg.  4. Left atrial size was mildly dilated.  5. The mitral valve is abnormal. Trivial mitral valve regurgitation. The mean mitral valve gradient is 3.0 mmHg with average heart rate of 72 bpm. Moderate mitral annular calcification. Calcified papillary muscle noted, not as well seen in prior study.  6. The aortic valve is tricuspid. Aortic valve regurgitation is not visualized. Aortic valve sclerosis is present, with no evidence of aortic valve stenosis. Comparison(s): A prior study was performed on 11/25/19. No significant change from prior study. Prior images reviewed side by side. FINDINGS  Left Ventricle: Left ventricular ejection fraction, by estimation, is 65 to 70%. The left ventricle has normal function. The left ventricle has no regional wall motion abnormalities. The left ventricular internal cavity size was normal in size. There is  no left ventricular hypertrophy of the basal-septal segment. Left ventricular diastolic parameters are consistent with Grade I diastolic dysfunction (impaired relaxation). Right Ventricle: The right ventricular size is normal. No increase in right ventricular wall thickness. Right ventricular systolic function is normal. There is normal pulmonary artery systolic pressure. The tricuspid regurgitant velocity is 2.84 m/s, and  with an assumed right atrial pressure of 3 mmHg, the estimated right ventricular systolic pressure is 81.4 mmHg. Left Atrium: Left atrial size was mildly dilated. Right Atrium: Right atrial size was normal in size. Pericardium: Trivial pericardial effusion is present. Mitral Valve: Calcified papillary muscle noted. The mitral valve is abnormal. There is moderate calcification of the mitral valve leaflet(s). Moderate mitral annular calcification. Trivial mitral valve  regurgitation. MV peak gradient, 7.8 mmHg. The mean mitral valve gradient is 3.0 mmHg with average heart rate of 72 bpm. Tricuspid Valve: The tricuspid valve is grossly normal. Tricuspid valve regurgitation is mild. Aortic Valve: The aortic valve is tricuspid. Aortic valve regurgitation is not visualized. Mild aortic valve sclerosis is present, with no evidence of aortic valve stenosis. Aortic valve mean gradient measures 7.0 mmHg. Aortic valve peak gradient measures 14.2 mmHg. Aortic valve area, by VTI measures 2.04 cm. Pulmonic Valve: The pulmonic valve was grossly normal. Pulmonic valve regurgitation is not visualized. No evidence of pulmonic stenosis. Aorta: The aortic root and ascending aorta are structurally normal, with no evidence of dilitation. IAS/Shunts: The atrial septum is grossly normal. Agitated saline contrast was given intravenously to evaluate for intracardiac shunting. Agitated saline contrast bubble study was negative, with no evidence of any interatrial shunt.  LEFT VENTRICLE PLAX 2D LVIDd:         5.00 cm     Diastology LVIDs:         1.70 cm     LV e' medial:    4.79 cm/s LV PW:         0.80 cm  LV E/e' medial:  15.3 LV IVS:        0.90 cm     LV e' lateral:   7.94 cm/s LVOT diam:     1.80 cm     LV E/e' lateral: 9.2 LV SV:         72 LV SV Index:   38 LVOT Area:     2.54 cm  LV Volumes (MOD) LV vol d, MOD A2C: 29.3 ml LV vol d, MOD A4C: 36.6 ml LV vol s, MOD A2C: 8.6 ml LV vol s, MOD A4C: 15.8 ml LV SV MOD A2C:     20.7 ml LV SV MOD A4C:     36.6 ml LV SV MOD BP:      20.5 ml RIGHT VENTRICLE RV S prime:     16.00 cm/s  PULMONARY VEINS TAPSE (M-mode): 1.8 cm      A Reversal Duration: 116.00 msec                             A Reversal Velocity: 33.20 cm/s                             Diastolic Velocity:  60.45 cm/s                             S/D Velocity:        1.50                             Systolic Velocity:   40.98 cm/s LEFT ATRIUM             Index       RIGHT ATRIUM           Index  LA diam:        3.70 cm 1.98 cm/m  RA Area:     16.10 cm LA Vol (A2C):   77.2 ml 41.36 ml/m RA Volume:   38.80 ml  20.79 ml/m LA Vol (A4C):   48.1 ml 25.77 ml/m LA Biplane Vol: 65.3 ml 34.99 ml/m  AORTIC VALVE                    PULMONIC VALVE AV Area (Vmax):    2.24 cm     PV Vmax:       0.88 m/s AV Area (Vmean):   2.19 cm     PV Vmean:      64.500 cm/s AV Area (VTI):     2.04 cm     PV VTI:        0.179 m AV Vmax:           188.50 cm/s  PV Peak grad:  3.1 mmHg AV Vmean:          119.500 cm/s PV Mean grad:  2.0 mmHg AV VTI:            0.351 m AV Peak Grad:      14.2 mmHg AV Mean Grad:      7.0 mmHg LVOT Vmax:         166.00 cm/s LVOT Vmean:        103.000 cm/s LVOT VTI:          0.281 m LVOT/AV VTI ratio: 0.80  AORTA Ao Root diam: 3.50 cm  Ao Asc diam:  3.20 cm MITRAL VALVE                TRICUSPID VALVE MV Area (PHT): 1.62 cm     TR Peak grad:   32.3 mmHg MV Area VTI:   2.03 cm     TR Vmax:        284.00 cm/s MV Peak grad:  7.8 mmHg MV Mean grad:  3.0 mmHg     SHUNTS MV Vmax:       1.40 m/s     Systemic VTI:  0.28 m MV Vmean:      73.7 cm/s    Systemic Diam: 1.80 cm MV Decel Time: 468 msec MR Peak grad: 43.8 mmHg MR Vmax:      331.00 cm/s MV E velocity: 73.30 cm/s MV A velocity: 127.00 cm/s MV E/A ratio:  0.58 Rudean Haskell MD Electronically signed by Rudean Haskell MD Signature Date/Time: 12/21/2020/11:24:53 AM    Final         Scheduled Meds: . atorvastatin  40 mg Oral Daily  . fluticasone  2 spray Each Nare QHS  . metoprolol tartrate  25 mg Oral BID  . pantoprazole  40 mg Oral Daily   Continuous Infusions: . azithromycin 500 mg (12/22/20 0113)  . cefTRIAXone (ROCEPHIN)  IV 2 g (12/21/20 2051)          Aline August, MD [facility] 12/22/2020, 9:24 AM

## 2020-12-22 NOTE — Progress Notes (Addendum)
Inpatient Rehab Admissions Coordinator:   Met with patient at bedside to discuss potential CIR admission. Pt. Stated interest, has been on CIR before and has good family support. Pt.'s daughter in law, Tyleah Loh,  to care for her 24/7 at discharge, as she did following previous admission.  She can provide up to [place]. Will pursue for potential admit next week, pending bed availability.   Clemens Catholic, [place], [place] Admissions Coordinator  [phone] ([place]) [phone] (office)

## 2020-12-23 NOTE — Progress Notes (Signed)
[name], female   DOB: 1940/12/03, 80 y.o.   MRN: [id number]  PROGRESS NOTE    Yolanda Flowers  YPP:[id number] DOB: October 28, 1940 DOA: 12/20/2020 PCP: [name], MD   Brief Narrative:  80 year old female with history of essential thrombocytosis currently no longer on hydroxyurea, chronic leukocytosis, chronic subdural hematoma (left-sided since 2008), melanoma, vulvar intraepithelial neoplasia, GERD, CAD status post CABG in 3/21, hypertension, COPD, hyperlipidemia and ischemic stroke (thought to be cardioembolic but source never identified-in 11/2019) presented with with fall.  On presentation, CT of the brain showed subdural hematoma measuring 10 mm in thickness.  Neurosurgery recommended conservative management and repeat CT of the brain in the morning.  She was also found to have evidence of multilobar pneumonia.  COVID-19 test was negative.  She was started on IV antibiotics.  She was found to have scattered embolic lacunar infarcts on MRI of the brain.  Neurology was consulted.  Repeat CT of the brain was negative for worsening of the subdural bleeding.  Neurosurgery signed off.  She is not a candidate for any antiplatelets or anticoagulation because of subdural hematoma and neurology recommended outpatient follow-up.  PT recommended CIR placement  Assessment & Plan:   Subdural hematoma Fall at home -CT on presentation showed a small acute parafalcine subdural hematoma.  This is superimposed on patient's longstanding history of chronic subdural hematoma since 2008. -Repeat CT of the brain did not show worsening of subdural hematoma.  Neurosurgery recommended conservative management and signed off.  Outpatient follow-up with neurosurgery if needed  Acute cardioembolic stroke -MRI of the brain showed scattered embolic lacunar infarcts -Neurology evaluated the patient: She is not a candidate for anticoagulation or antiplatelets because of subdural hematoma.  Dose of Lipitor has  been decreased to 40 mg daily by neurology. -CTA head and neck showed no significant stenosis -2D echo with bubble showed EF of 65 to 70%, left atrium mildly dilated -CT chest/abdomen and pelvis was ordered by neurology to rule out malignancy: There was no evidence of malignancy on the CT -LDL 25.  A1c 5.8 -PT recommended CIR placement.    CIR has been consulted.  Insurance denied CIR placement.  Family has appealed the denial. -diet as per SLP recommendations  Community-acquired multilobar bacterial pneumonia -COVID-19 testing was negative on presentation.  Currently on Rocephin and Zithromax. Currently on room air  ?  MDS Leukocytosis Thrombocytopenia Anemia of chronic disease: Possibly from above and other chronic illnesses -History of thrombocytosis with recent chronic thrombocytopenia and leukocytosis with concern for MDS.  Bone marrow biopsy planned as an outpatient soon. -Hemoglobin stable currently  CAD -Chest pain-free.  Continue statin.  Cannot continue antiplatelet because of subdural hematoma.  Outpatient follow-up with cardiology  GERD -Continue PPI  Hyperlipidemia -Statin plan as above   DVT prophylaxis: SCDs Code Status: DNR Family Communication: None at bedside Disposition Plan: Status is: Inpatient  Remains inpatient appropriate because:Inpatient level of care appropriate due to severity of illness   Dispo: The patient is from: Home              Anticipated d/c is to: CIR versus home with home health pending appeal of the denial              Patient currently is medically stable to d/c.   Difficult to place patient No  Consultants: Neurosurgery/neurology  Procedures: Echo  Antimicrobials: Rocephin and Zithromax   Subjective: Patient seen and examined at bedside.  No worsening shortness of breath, fever  or vomiting reported. Objective: Vitals:   12/22/20 1542 12/22/20 1950 12/22/20 2311 12/23/20 0342  BP: 110/61 134/70 122/69 (!) 117/91   Pulse: 70 77 75 68  Resp: _0 Temp: 99.5 F (37.5 C) 97.7 F (36.5 C) 99 F (37.2 C) 98.1 F (36.7 C)  TempSrc: Oral Oral Oral Oral  SpO2: 92% 92% 91% 93%  Weight:      Height:        Intake/Output Summary (Last 24 hours) at 12/23/2020 0724 Last data filed at 12/23/2020 0655 Gross per 24 hour  Intake 293.92 ml  Output 800 ml  Net -506.08 ml   Filed Weights   12/20/20 1428  Weight: 77.1 kg    Examination:  General exam: No acute distress.  Elderly female lying in bed.  On room air currently.  Respiratory system: Decreased breath sounds at bases bilaterally with some crackles cardiovascular system: Rate controlled, S1-S2 heard Gastrointestinal system: Abdomen is slightly distended, soft and nontender.  Bowel sounds are heard  extremities: No edema or clubbing    Data Reviewed: I have personally reviewed following labs and imaging studies  CBC: Recent Labs  Lab 12/18/20 0000 12/20/20 1525 12/21/20 0236 12/22/20 0951  WBC 24.2 28.2* 23.2* 23.0*  NEUTROABS 19.36 25.7* 20.4* 17.3*  HGB 10.6* 10.3* 9.5* 10.0*  HCT 33* 33.8* 31.5* 32.4*  MCV 96 100.0 102.3* 99.7  PLT 153 172 154 734*   Basic Metabolic Panel: Recent Labs  Lab 12/18/20 0000 12/20/20 1525 12/21/20 0236 12/22/20 0951  NA 139 138 138 137  K 4.4 4.5 4.3 4.4  CL 104 104 106 107  CO2 28* _1 GLUCOSE  --  99 97 103*  BUN _2 CREATININE 1.0 0.96 0.94 1.05*  CALCIUM 8.8 9.0 8.5* 8.1*  MG  --   --   --  2.3   GFR: Estimated Creatinine Clearance: 44.8 mL/min (A) (by C-G formula based on SCr of 1.05 mg/dL (H)). Liver Function Tests: Recent Labs  Lab 12/18/20 0000 12/20/20 1525 12/21/20 0236  AST 26 17 13*  ALT _3 ALKPHOS 105 73 66  BILITOT  --  0.8 0.3  PROT  --  7.6 6.8  ALBUMIN 4.2 3.3* 3.0*   No results for input(s): LIPASE, AMYLASE in the last 168 hours. No results for input(s): AMMONIA in the last 168 hours. Coagulation Profile: Recent Labs  Lab  12/20/20 1525 12/21/20 0236  INR 1.2 1.3*   Cardiac Enzymes: Recent Labs  Lab 12/20/20 1525  CKTOTAL 115   BNP (last 3 results) No results for input(s): PROBNP in the last 8760 hours. HbA1C: Recent Labs    12/21/20 0236  HGBA1C 5.8*   CBG: No results for input(s): GLUCAP in the last 168 hours. Lipid Profile: Recent Labs    12/21/20 0236  CHOL 74  HDL 28*  LDLCALC 25  TRIG 104  CHOLHDL 2.6   Thyroid Function Tests: Recent Labs    12/20/20 1525  TSH 1.211   Anemia Panel: Recent Labs    12/21/20 0000  FOLATE >20   Sepsis Labs: Recent Labs  Lab 12/20/20 1644 12/20/20 1815 12/21/20 0236  PROCALCITON  --   --  <0.10  LATICACIDVEN 1.2 1.1  --     Recent Results (from the past 240 hour(s))  Blood culture (routine x 2)     Status: None (Preliminary result)   Collection Time: 12/20/20  4:44 PM   Specimen: BLOOD  Result Value Ref Range Status   Specimen Description BLOOD SITE NOT SPECIFIED  Final   Special Requests   Final    BOTTLES DRAWN AEROBIC AND ANAEROBIC Blood Culture results may not be optimal due to an inadequate volume of blood received in culture bottles   Culture   Final    NO GROWTH 2 DAYS Performed at [facility], [place] [address], [place], [place] [address]    Report Status PENDING  Incomplete  Blood culture (routine x 2)     Status: None (Preliminary result)   Collection Time: 12/20/20  4:44 PM   Specimen: BLOOD  Result Value Ref Range Status   Specimen Description BLOOD SITE NOT SPECIFIED  Final   Special Requests   Final    BOTTLES DRAWN AEROBIC AND ANAEROBIC Blood Culture results may not be optimal due to an inadequate volume of blood received in culture bottles   Culture   Final    NO GROWTH 2 DAYS Performed at [facility], [place] [address], [place], [place] [address]    Report Status PENDING  Incomplete  Resp Panel by RT-PCR (Flu A&B, Covid) Nasopharyngeal Swab     Status: None   Collection Time: 12/20/20  4:44 PM    Specimen: Nasopharyngeal Swab; Nasopharyngeal(NP) swabs in vial transport medium  Result Value Ref Range Status   SARS Coronavirus 2 by RT PCR NEGATIVE NEGATIVE Final    Comment: (NOTE) SARS-CoV-2 target nucleic acids are NOT DETECTED.  The SARS-CoV-2 RNA is generally detectable in upper respiratory specimens during the acute phase of infection. The lowest concentration of SARS-CoV-2 viral copies this assay can detect is 138 copies/mL. A negative result does not preclude SARS-Cov-2 infection and should not be used as the sole basis for treatment or other patient management decisions. A negative result may occur with  improper specimen collection/handling, submission of specimen other than nasopharyngeal swab, presence of viral mutation(s) within the areas targeted by this assay, and inadequate number of viral copies(<138 copies/mL). A negative result must be combined with clinical observations, patient history, and epidemiological information. The expected result is Negative.  Fact Sheet for Patients:  EntrepreneurPulse.com.au  Fact Sheet for Healthcare Providers:  IncredibleEmployment.be  This test is no t yet approved or cleared by the [place] FDA and  has been authorized for detection and/or diagnosis of SARS-CoV-2 by FDA under an Emergency Use Authorization (EUA). This EUA will remain  in effect (meaning this test can be used) for the duration of the COVID-19 declaration under Section 564(b)(1) of the Act, 21 U.S.C.section 360bbb-3(b)(1), unless the authorization is terminated  or revoked sooner.       Influenza A by PCR NEGATIVE NEGATIVE Final   Influenza B by PCR NEGATIVE NEGATIVE Final    Comment: (NOTE) The Xpert Xpress SARS-CoV-2/FLU/RSV plus assay is intended as an aid in the diagnosis of influenza from Nasopharyngeal swab specimens and should not be used as a sole basis for treatment. Nasal washings and aspirates are  unacceptable for Xpert Xpress SARS-CoV-2/FLU/RSV testing.  Fact Sheet for Patients: EntrepreneurPulse.com.au  Fact Sheet for Healthcare Providers: IncredibleEmployment.be  This test is not yet approved or cleared by the [place] FDA and has been authorized for detection and/or diagnosis of SARS-CoV-2 by FDA under an Emergency Use Authorization (EUA). This EUA will remain in effect (meaning this test can be used) for the duration of the COVID-19 declaration under Section 564(b)(1) of the Act, 21 U.S.C. section 360bbb-3(b)(1), unless the authorization is  terminated or revoked.  Performed at [facility], [place] [address], [place], [place] [address]   Urine culture     Status: Abnormal   Collection Time: 12/21/20 12:01 AM   Specimen: Urine, Random  Result Value Ref Range Status   Specimen Description URINE, RANDOM  Final   Special Requests NONE  Final   Culture (A)  Final    <10,000 COLONIES/mL INSIGNIFICANT GROWTH Performed at [facility], [place] [address], [place], [place] [address]    Report Status 12/22/2020 FINAL  Final         Radiology Studies: CT CHEST ABDOMEN PELVIS W CONTRAST  Result Date: 12/21/2020 CLINICAL DATA:  Multiple falls yesterday EXAM: CT CHEST, ABDOMEN, AND PELVIS WITH CONTRAST TECHNIQUE: Multidetector CT imaging of the chest, abdomen and pelvis was performed following the standard protocol during bolus administration of intravenous contrast. CONTRAST:  149m OMNIPAQUE IOHEXOL 300 MG/ML  SOLN COMPARISON:  12/20/2020, 02/22/2020 FINDINGS: CT CHEST FINDINGS Cardiovascular: The heart and great vessels are unremarkable with no pericardial effusion. Dense calcification of the mitral annulus. Extensive atherosclerosis of the native coronary vessels, with evidence of prior CABG. No evidence of thoracic aortic aneurysm or dissection. Moderate atherosclerosis of the aortic arch. Mediastinum/Nodes: No enlarged  mediastinal, hilar, or axillary lymph nodes. Thyroid gland, trachea, and esophagus demonstrate no significant findings. Lungs/Pleura: There is a 6 mm right upper lobe pulmonary nodule reference image 29/6, and a 5 mm right middle lobe pulmonary nodule reference image 39/6. These are unchanged since 2020 and can be considered benign. There is dense consolidation within the right upper lobe and superior segment right lower lobe with air bronchograms, compatible with pneumonia. Nodular area of consolidation within the superior segment left lower lobe image 30/6 measures 9 mm, and may reflect an additional focus of infection though follow-up CT will be needed to assess resolution. No effusion or pneumothorax.  The central airways are patent. Musculoskeletal: There are no acute or destructive bony lesions. Reconstructed images demonstrate no additional findings. CT ABDOMEN PELVIS FINDINGS Hepatobiliary: High attenuation material within the gallbladder likely reflects vicarious excretion of previously administered contrast. No evidence of cholelithiasis or cholecystitis. Liver is unremarkable. Pancreas: Unremarkable. No pancreatic ductal dilatation or surrounding inflammatory changes. Spleen: Borderline splenomegaly measuring 12.1 cm in anterior-posterior dimension, stable. No focal abnormalities. Adrenals/Urinary Tract: Stable hypodensities right kidney compatible with small cysts. No urinary tract calculi or obstructive uropathy. Excreted contrast is seen within the urinary bladder, with no filling defects. The adrenals are unremarkable. Stomach/Bowel: No bowel obstruction or ileus. Diffuse diverticulosis of the distal colon without diverticulitis. No bowel wall thickening or inflammatory change. Vascular/Lymphatic: Aortic atherosclerosis. No enlarged abdominal or pelvic lymph nodes. Reproductive: Status post hysterectomy. No adnexal masses. Other: No free fluid or free gas.  No abdominal wall hernia. Musculoskeletal:  No acute or destructive bony lesions. Reconstructed images demonstrate no additional findings. IMPRESSION: 1. No acute intrathoracic, intra-abdominal, or intrapelvic trauma. 2. Multifocal pneumonia, most pronounced in the right upper and right lower lobes. 3. Nodular consolidation within the superior segment left lower lobe measuring 9 mm, favor infection. CT follow-up will be needed after appropriate medical management to document resolution. 4. Benign subcentimeter nodules within the right upper and right middle lobes. 5. Borderline splenomegaly. 6.  Aortic Atherosclerosis (ICD10-I70.0). Electronically Signed   By: MRanda NgoM.D.   On: 12/21/2020 15:34   ECHOCARDIOGRAM COMPLETE BUBBLE STUDY  Result Date: 12/21/2020    ECHOCARDIOGRAM REPORT   Patient Name:   Yolanda Flowers  Sabia Date of Exam: 12/21/2020 Medical Rec #:  [id number]      Height:       66.0 in Accession #:    [id number]     Weight:       170.0 lb Date of Birth:  01/15/1941     BSA:          1.866 m Patient Age:    68 years       BP:           125/73 mmHg Patient Gender: F              HR:           71 bpm. Exam Location:  Inpatient Procedure: 2D Echo, Cardiac Doppler, Color Doppler and Saline Contrast Bubble            Study Indications:    CVA  History:        Patient has prior history of Echocardiogram examinations, most                 recent 11/25/2019. CAD, Stroke and COPD, Signs/Symptoms:Shortness                 of Breath; Risk Factors:Hypertension and Dyslipidemia.  Sonographer:    [place] Referring Phys: [id number] [place]  1. Agitated saline contrast bubble study was negative, with no evidence of any interatrial shunt.  2. Left ventricular ejection fraction, by estimation, is 65 to 70%. The left ventricle has normal function. The left ventricle has no regional wall motion abnormalities. Left ventricular diastolic parameters are consistent with Grade I diastolic dysfunction (impaired relaxation).  3. Right  ventricular systolic function is normal. The right ventricular size is normal. There is normal pulmonary artery systolic pressure. The estimated right ventricular systolic pressure is 81.2 mmHg.  4. Left atrial size was mildly dilated.  5. The mitral valve is abnormal. Trivial mitral valve regurgitation. The mean mitral valve gradient is 3.0 mmHg with average heart rate of 72 bpm. Moderate mitral annular calcification. Calcified papillary muscle noted, not as well seen in prior study.  6. The aortic valve is tricuspid. Aortic valve regurgitation is not visualized. Aortic valve sclerosis is present, with no evidence of aortic valve stenosis. Comparison(s): A prior study was performed on 11/25/19. No significant change from prior study. Prior images reviewed side by side. FINDINGS  Left Ventricle: Left ventricular ejection fraction, by estimation, is 65 to 70%. The left ventricle has normal function. The left ventricle has no regional wall motion abnormalities. The left ventricular internal cavity size was normal in size. There is  no left ventricular hypertrophy of the basal-septal segment. Left ventricular diastolic parameters are consistent with Grade I diastolic dysfunction (impaired relaxation). Right Ventricle: The right ventricular size is normal. No increase in right ventricular wall thickness. Right ventricular systolic function is normal. There is normal pulmonary artery systolic pressure. The tricuspid regurgitant velocity is 2.84 m/s, and  with an assumed right atrial pressure of 3 mmHg, the estimated right ventricular systolic pressure is 75.1 mmHg. Left Atrium: Left atrial size was mildly dilated. Right Atrium: Right atrial size was normal in size. Pericardium: Trivial pericardial effusion is present. Mitral Valve: Calcified papillary muscle noted. The mitral valve is abnormal. There is moderate calcification of the mitral valve leaflet(s). Moderate mitral annular calcification. Trivial mitral valve  regurgitation. MV peak gradient, 7.8 mmHg. The mean mitral valve gradient is 3.0 mmHg with average heart rate of 72 bpm. Tricuspid  Valve: The tricuspid valve is grossly normal. Tricuspid valve regurgitation is mild. Aortic Valve: The aortic valve is tricuspid. Aortic valve regurgitation is not visualized. Mild aortic valve sclerosis is present, with no evidence of aortic valve stenosis. Aortic valve mean gradient measures 7.0 mmHg. Aortic valve peak gradient measures 14.2 mmHg. Aortic valve area, by VTI measures 2.04 cm. Pulmonic Valve: The pulmonic valve was grossly normal. Pulmonic valve regurgitation is not visualized. No evidence of pulmonic stenosis. Aorta: The aortic root and ascending aorta are structurally normal, with no evidence of dilitation. IAS/Shunts: The atrial septum is grossly normal. Agitated saline contrast was given intravenously to evaluate for intracardiac shunting. Agitated saline contrast bubble study was negative, with no evidence of any interatrial shunt.  LEFT VENTRICLE PLAX 2D LVIDd:         5.00 cm     Diastology LVIDs:         1.70 cm     LV e' medial:    4.79 cm/s LV PW:         0.80 cm     LV E/e' medial:  15.3 LV IVS:        0.90 cm     LV e' lateral:   7.94 cm/s LVOT diam:     1.80 cm     LV E/e' lateral: 9.2 LV SV:         72 LV SV Index:   38 LVOT Area:     2.54 cm  LV Volumes (MOD) LV vol d, MOD A2C: 29.3 ml LV vol d, MOD A4C: 36.6 ml LV vol s, MOD A2C: 8.6 ml LV vol s, MOD A4C: 15.8 ml LV SV MOD A2C:     20.7 ml LV SV MOD A4C:     36.6 ml LV SV MOD BP:      20.5 ml RIGHT VENTRICLE RV S prime:     16.00 cm/s  PULMONARY VEINS TAPSE (M-mode): 1.8 cm      A Reversal Duration: 116.00 msec                             A Reversal Velocity: 33.20 cm/s                             Diastolic Velocity:  10.27 cm/s                             S/D Velocity:        1.50                             Systolic Velocity:   25.36 cm/s LEFT ATRIUM             Index       RIGHT ATRIUM           Index  LA diam:        3.70 cm 1.98 cm/m  RA Area:     16.10 cm LA Vol (A2C):   77.2 ml 41.36 ml/m RA Volume:   38.80 ml  20.79 ml/m LA Vol (A4C):   48.1 ml 25.77 ml/m LA Biplane Vol: 65.3 ml 34.99 ml/m  AORTIC VALVE                    PULMONIC VALVE AV Area (  Vmax):    2.24 cm     PV Vmax:       0.88 m/s AV Area (Vmean):   2.19 cm     PV Vmean:      64.500 cm/s AV Area (VTI):     2.04 cm     PV VTI:        0.179 m AV Vmax:           188.50 cm/s  PV Peak grad:  3.1 mmHg AV Vmean:          119.500 cm/s PV Mean grad:  2.0 mmHg AV VTI:            0.351 m AV Peak Grad:      14.2 mmHg AV Mean Grad:      7.0 mmHg LVOT Vmax:         166.00 cm/s LVOT Vmean:        103.000 cm/s LVOT VTI:          0.281 m LVOT/AV VTI ratio: 0.80  AORTA Ao Root diam: 3.50 cm Ao Asc diam:  3.20 cm MITRAL VALVE                TRICUSPID VALVE MV Area (PHT): 1.62 cm     TR Peak grad:   32.3 mmHg MV Area VTI:   2.03 cm     TR Vmax:        284.00 cm/s MV Peak grad:  7.8 mmHg MV Mean grad:  3.0 mmHg     SHUNTS MV Vmax:       1.40 m/s     Systemic VTI:  0.28 m MV Vmean:      73.7 cm/s    Systemic Diam: 1.80 cm MV Decel Time: 468 msec MR Peak grad: 43.8 mmHg MR Vmax:      331.00 cm/s MV E velocity: 73.30 cm/s MV A velocity: 127.00 cm/s MV E/A ratio:  0.58 Rudean Haskell MD Electronically signed by Rudean Haskell MD Signature Date/Time: 12/21/2020/11:24:53 AM    Final    VAS [place] LOWER EXTREMITY VENOUS (DVT)  Result Date: 12/22/2020  Lower Venous DVT Study Patient Name:  Yolanda Flowers  Date of Exam:   12/21/2020 Medical Rec #: [id number]       Accession #:    [id number] Date of Birth: Apr 26, 1941      Patient Gender: F Patient Age:   79Y Exam Location:  [facility] Procedure:      VAS [place] LOWER EXTREMITY VENOUS (DVT) Referring Phys: [id number] Rosalin Hawking --------------------------------------------------------------------------------  Indications: Stroke.  Risk Factors: None identified. Comparison Study: No prior studies. Performing  Technologist: Oliver Hum RVT  Examination Guidelines: A complete evaluation includes B-mode imaging, spectral Doppler, color Doppler, and power Doppler as needed of all accessible portions of each vessel. Bilateral testing is considered an integral part of a complete examination. Limited examinations for reoccurring indications may be performed as noted. The reflux portion of the exam is performed with the patient in reverse Trendelenburg.  +---------+---------------+---------+-----------+----------+--------------+ RIGHT    CompressibilityPhasicitySpontaneityPropertiesThrombus Aging +---------+---------------+---------+-----------+----------+--------------+ CFV      Full           Yes      Yes                                 +---------+---------------+---------+-----------+----------+--------------+ SFJ      Full                                                        +---------+---------------+---------+-----------+----------+--------------+  FV Prox  Full                                                        +---------+---------------+---------+-----------+----------+--------------+ FV Mid   Full                                                        +---------+---------------+---------+-----------+----------+--------------+ FV DistalFull                                                        +---------+---------------+---------+-----------+----------+--------------+ PFV      Full                                                        +---------+---------------+---------+-----------+----------+--------------+ POP      Full           Yes      Yes                                 +---------+---------------+---------+-----------+----------+--------------+ PTV      Full                                                        +---------+---------------+---------+-----------+----------+--------------+ PERO     Full                                                         +---------+---------------+---------+-----------+----------+--------------+   +---------+---------------+---------+-----------+----------+--------------+ LEFT     CompressibilityPhasicitySpontaneityPropertiesThrombus Aging +---------+---------------+---------+-----------+----------+--------------+ CFV      Full           Yes      Yes                                 +---------+---------------+---------+-----------+----------+--------------+ SFJ      Full                                                        +---------+---------------+---------+-----------+----------+--------------+ FV Prox  Full                                                        +---------+---------------+---------+-----------+----------+--------------+   FV Mid   Full                                                        +---------+---------------+---------+-----------+----------+--------------+ FV DistalFull                                                        +---------+---------------+---------+-----------+----------+--------------+ PFV      Full                                                        +---------+---------------+---------+-----------+----------+--------------+ POP      Full           Yes      Yes                                 +---------+---------------+---------+-----------+----------+--------------+ PTV      Full                                                        +---------+---------------+---------+-----------+----------+--------------+ PERO     Full                                                        +---------+---------------+---------+-----------+----------+--------------+     Summary: RIGHT: - There is no evidence of deep vein thrombosis in the lower extremity.  - No cystic structure found in the popliteal fossa.  LEFT: - There is no evidence of deep vein thrombosis in the lower extremity.  - No cystic structure found in  the popliteal fossa.  *See table(s) above for measurements and observations. Electronically signed by Ruta Hinds MD on 12/22/2020 at 5:35:21 PM.    Final         Scheduled Meds: . atorvastatin  40 mg Oral Daily  . fluticasone  2 spray Each Nare QHS  . metoprolol tartrate  25 mg Oral BID  . pantoprazole  40 mg Oral Daily   Continuous Infusions: . sodium chloride 10 mL/hr at 12/22/20 2358  . azithromycin 500 mg (12/23/20 0040)  . cefTRIAXone (ROCEPHIN)  IV Stopped (12/22/20 2128)          Aline August, MD [facility] 12/23/2020, 7:24 AM

## 2020-12-23 NOTE — Plan of Care (Signed)
  Problem: Education: Goal: Knowledge of disease or condition will improve Outcome: Progressing Goal: Knowledge of secondary prevention will improve Outcome: Progressing Goal: Knowledge of patient specific risk factors addressed and post discharge goals established will improve Outcome: Progressing   Problem: Coping: Goal: Will verbalize positive feelings about self Outcome: Progressing Goal: Will identify appropriate support needs Outcome: Progressing   Problem: Ischemic Stroke/TIA Tissue Perfusion: Goal: Complications of ischemic stroke/TIA will be minimized Outcome: Progressing   

## 2020-12-24 NOTE — Progress Notes (Signed)
[name], female   DOB: 1940/08/22, 80 y.o.   MRN: [id number]  PROGRESS NOTE    AMILYAH NACK  YBO:[id number] DOB: March 18, 1941 DOA: 12/20/2020 PCP: [name], MD   Brief Narrative:  80 year old female with history of essential thrombocytosis currently no longer on hydroxyurea, chronic leukocytosis, chronic subdural hematoma (left-sided since 2008), melanoma, vulvar intraepithelial neoplasia, GERD, CAD status post CABG in 3/21, hypertension, COPD, hyperlipidemia and ischemic stroke (thought to be cardioembolic but source never identified-in 11/2019) presented with with fall.  On presentation, CT of the brain showed subdural hematoma measuring 10 mm in thickness.  Neurosurgery recommended conservative management and repeat CT of the brain in the morning.  She was also found to have evidence of multilobar pneumonia.  COVID-19 test was negative.  She was started on IV antibiotics.  She was found to have scattered embolic lacunar infarcts on MRI of the brain.  Neurology was consulted.  Repeat CT of the brain was negative for worsening of the subdural bleeding.  Neurosurgery signed off.  She is not a candidate for any antiplatelets or anticoagulation because of subdural hematoma and neurology recommended outpatient follow-up.  PT recommended CIR placement  Assessment & Plan:   Subdural hematoma Fall at home -CT on presentation showed a small acute parafalcine subdural hematoma.  This is superimposed on patient's longstanding history of chronic subdural hematoma since 2008. -Repeat CT of the brain did not show worsening of subdural hematoma.  Neurosurgery recommended conservative management and signed off.  Outpatient follow-up with neurosurgery if needed  Acute cardioembolic stroke -MRI of the brain showed scattered embolic lacunar infarcts -Neurology evaluated the patient: She is not a candidate for anticoagulation or antiplatelets because of subdural hematoma.  Dose of Lipitor has  been decreased to 40 mg daily by neurology. -CTA head and neck showed no significant stenosis -2D echo with bubble showed EF of 65 to 70%, left atrium mildly dilated -CT chest/abdomen and pelvis was ordered by neurology to rule out malignancy: There was no evidence of malignancy on the CT -LDL 25.  A1c 5.8 -PT recommended CIR placement.    CIR has been consulted.  Insurance denied CIR placement.  Family has appealed the denial. -diet as per SLP recommendations  Community-acquired multilobar bacterial pneumonia -COVID-19 testing was negative on presentation.  Currently on Rocephin, finished 5-day of Rocephin.  Completed 3-day course of Zithromax.  Currently on room air  ?  MDS Leukocytosis Thrombocytopenia Anemia of chronic disease: Possibly from above and other chronic illnesses -History of thrombocytosis with recent chronic thrombocytopenia and leukocytosis with concern for MDS.  Bone marrow biopsy planned as an outpatient soon. -Hemoglobin stable currently  CAD -Chest pain-free.  Continue statin.  Cannot continue antiplatelet because of subdural hematoma.  Outpatient follow-up with cardiology  GERD -Continue PPI  Hyperlipidemia -Statin plan as above   DVT prophylaxis: SCDs Code Status: DNR Family Communication: None at bedside Disposition Plan: Status is: Inpatient  Remains inpatient appropriate because:Inpatient level of care appropriate due to severity of illness   Dispo: The patient is from: Home              Anticipated d/c is to: CIR versus home with home health pending appeal of the denial              Patient currently is medically stable to d/c.   Difficult to place patient No  Consultants: Neurosurgery/neurology  Procedures: Echo  Antimicrobials: Rocephin and Zithromax   Subjective: Patient seen and examined  at bedside.  Denies overnight fever, worsening shortness of breath, cough, abdominal pain or vomiting.  Still feels very weak and states that she  has been hardly able to walk to the bathroom. Objective: Vitals:   12/23/20 1555 12/23/20 2003 12/23/20 2358 12/24/20 0410  BP: 109/65 123/81 134/73 132/84  Pulse: 67 74 72 65  Resp:  _0 Temp: 99 F (37.2 C) 98.9 F (37.2 C) 98.8 F (37.1 C) 98.2 F (36.8 C)  TempSrc: Oral Oral Oral Oral  SpO2: 93% 95% 94% 91%  Weight:      Height:        Intake/Output Summary (Last 24 hours) at 12/24/2020 0730 Last data filed at 12/24/2020 0650 Gross per 24 hour  Intake 412.89 ml  Output --  Net 412.89 ml   Filed Weights   12/20/20 1428  Weight: 77.1 kg    Examination:  General exam: On room air.  No acute distress elderly female lying in bed.    Respiratory system: Bilateral decreased breath sounds at bases with scattered crackles  cardiovascular system: S1-S2 heard, rate controlled Gastrointestinal system: Abdomen is distended slightly, soft and nontender.  Normal bowel sounds heard  extremities: No clubbing, cyanosis or edema   Data Reviewed: I have personally reviewed following labs and imaging studies  CBC: Recent Labs  Lab 12/18/20 0000 12/20/20 1525 12/21/20 0236 12/22/20 0951  WBC 24.2 28.2* 23.2* 23.0*  NEUTROABS 19.36 25.7* 20.4* 17.3*  HGB 10.6* 10.3* 9.5* 10.0*  HCT 33* 33.8* 31.5* 32.4*  MCV 96 100.0 102.3* 99.7  PLT 153 172 154 782*   Basic Metabolic Panel: Recent Labs  Lab 12/18/20 0000 12/20/20 1525 12/21/20 0236 12/22/20 0951  NA 139 138 138 137  K 4.4 4.5 4.3 4.4  CL 104 104 106 107  CO2 28* _1 GLUCOSE  --  99 97 103*  BUN _2 CREATININE 1.0 0.96 0.94 1.05*  CALCIUM 8.8 9.0 8.5* 8.1*  MG  --   --   --  2.3   GFR: Estimated Creatinine Clearance: 44.8 mL/min (A) (by C-G formula based on SCr of 1.05 mg/dL (H)). Liver Function Tests: Recent Labs  Lab 12/18/20 0000 12/20/20 1525 12/21/20 0236  AST 26 17 13*  ALT _3 ALKPHOS 105 73 66  BILITOT  --  0.8 0.3  PROT  --  7.6 6.8  ALBUMIN 4.2 3.3* 3.0*   No results  for input(s): LIPASE, AMYLASE in the last 168 hours. No results for input(s): AMMONIA in the last 168 hours. Coagulation Profile: Recent Labs  Lab 12/20/20 1525 12/21/20 0236  INR 1.2 1.3*   Cardiac Enzymes: Recent Labs  Lab 12/20/20 1525  CKTOTAL 115   BNP (last 3 results) No results for input(s): PROBNP in the last 8760 hours. HbA1C: No results for input(s): HGBA1C in the last 72 hours. CBG: No results for input(s): GLUCAP in the last 168 hours. Lipid Profile: No results for input(s): CHOL, HDL, LDLCALC, TRIG, CHOLHDL, LDLDIRECT in the last 72 hours. Thyroid Function Tests: No results for input(s): TSH, T4TOTAL, FREET4, T3FREE, THYROIDAB in the last 72 hours. Anemia Panel: No results for input(s): VITAMINB12, FOLATE, FERRITIN, TIBC, IRON, RETICCTPCT in the last 72 hours. Sepsis Labs: Recent Labs  Lab 12/20/20 1644 12/20/20 1815 12/21/20 0236  PROCALCITON  --   --  <0.10  LATICACIDVEN 1.2 1.1  --     Recent Results (from the past 240 hour(s))  Blood culture (  routine x 2)     Status: None (Preliminary result)   Collection Time: 12/20/20  4:44 PM   Specimen: BLOOD  Result Value Ref Range Status   Specimen Description BLOOD SITE NOT SPECIFIED  Final   Special Requests   Final    BOTTLES DRAWN AEROBIC AND ANAEROBIC Blood Culture results may not be optimal due to an inadequate volume of blood received in culture bottles   Culture   Final    NO GROWTH 3 DAYS Performed at [facility], [place] [address], [place], [place] [address]    Report Status PENDING  Incomplete  Blood culture (routine x 2)     Status: None (Preliminary result)   Collection Time: 12/20/20  4:44 PM   Specimen: BLOOD  Result Value Ref Range Status   Specimen Description BLOOD SITE NOT SPECIFIED  Final   Special Requests   Final    BOTTLES DRAWN AEROBIC AND ANAEROBIC Blood Culture results may not be optimal due to an inadequate volume of blood received in culture bottles   Culture   Final     NO GROWTH 3 DAYS Performed at [facility], [place] [address] [place], [place] [address]    Report Status PENDING  Incomplete  Resp Panel by RT-PCR (Flu A&B, Covid) Nasopharyngeal Swab     Status: None   Collection Time: 12/20/20  4:44 PM   Specimen: Nasopharyngeal Swab; Nasopharyngeal(NP) swabs in vial transport medium  Result Value Ref Range Status   SARS Coronavirus 2 by RT PCR NEGATIVE NEGATIVE Final    Comment: (NOTE) SARS-CoV-2 target nucleic acids are NOT DETECTED.  The SARS-CoV-2 RNA is generally detectable in upper respiratory specimens during the acute phase of infection. The lowest concentration of SARS-CoV-2 viral copies this assay can detect is 138 copies/mL. A negative result does not preclude SARS-Cov-2 infection and should not be used as the sole basis for treatment or other patient management decisions. A negative result may occur with  improper specimen collection/handling, submission of specimen other than nasopharyngeal swab, presence of viral mutation(s) within the areas targeted by this assay, and inadequate number of viral copies(<138 copies/mL). A negative result must be combined with clinical observations, patient history, and epidemiological information. The expected result is Negative.  Fact Sheet for Patients:  EntrepreneurPulse.com.au  Fact Sheet for Healthcare Providers:  IncredibleEmployment.be  This test is no t yet approved or cleared by the [place] FDA and  has been authorized for detection and/or diagnosis of SARS-CoV-2 by FDA under an Emergency Use Authorization (EUA). This EUA will remain  in effect (meaning this test can be used) for the duration of the COVID-19 declaration under Section 564(b)(1) of the Act, 21 U.S.C.section 360bbb-3(b)(1), unless the authorization is terminated  or revoked sooner.       Influenza A by PCR NEGATIVE NEGATIVE Final   Influenza B by PCR NEGATIVE NEGATIVE Final     Comment: (NOTE) The Xpert Xpress SARS-CoV-2/FLU/RSV plus assay is intended as an aid in the diagnosis of influenza from Nasopharyngeal swab specimens and should not be used as a sole basis for treatment. Nasal washings and aspirates are unacceptable for Xpert Xpress SARS-CoV-2/FLU/RSV testing.  Fact Sheet for Patients: EntrepreneurPulse.com.au  Fact Sheet for Healthcare Providers: IncredibleEmployment.be  This test is not yet approved or cleared by the [place] FDA and has been authorized for detection and/or diagnosis of SARS-CoV-2 by FDA under an Emergency Use Authorization (EUA). This EUA will remain in effect (meaning this test  can be used) for the duration of the COVID-19 declaration under Section 564(b)(1) of the Act, 21 U.S.C. section 360bbb-3(b)(1), unless the authorization is terminated or revoked.  Performed at [facility], [place] [address] [place], [place] [address]   Urine culture     Status: Abnormal   Collection Time: 12/21/20 12:01 AM   Specimen: Urine, Random  Result Value Ref Range Status   Specimen Description URINE, RANDOM  Final   Special Requests NONE  Final   Culture (A)  Final    <10,000 COLONIES/mL INSIGNIFICANT GROWTH Performed at [facility], [place] [address], [place], [place] [address]    Report Status 12/22/2020 FINAL  Final         Radiology Studies: VAS [place] LOWER EXTREMITY VENOUS (DVT)  Result Date: 12/22/2020  Lower Venous DVT Study Patient Name:  AVIE CHECO  Date of Exam:   12/21/2020 Medical Rec #: [id number]       Accession #:    [id number] Date of Birth: Mar 29, 1941      Patient Gender: F Patient Age:   79Y Exam Location:  [facility] Procedure:      VAS [place] LOWER EXTREMITY VENOUS (DVT) Referring Phys: [id number] Rosalin Hawking --------------------------------------------------------------------------------  Indications: Stroke.  Risk Factors: None identified. Comparison Study: No  prior studies. Performing Technologist: Oliver Hum RVT  Examination Guidelines: A complete evaluation includes B-mode imaging, spectral Doppler, color Doppler, and power Doppler as needed of all accessible portions of each vessel. Bilateral testing is considered an integral part of a complete examination. Limited examinations for reoccurring indications may be performed as noted. The reflux portion of the exam is performed with the patient in reverse Trendelenburg.  +---------+---------------+---------+-----------+----------+--------------+ RIGHT    CompressibilityPhasicitySpontaneityPropertiesThrombus Aging +---------+---------------+---------+-----------+----------+--------------+ CFV      Full           Yes      Yes                                 +---------+---------------+---------+-----------+----------+--------------+ SFJ      Full                                                        +---------+---------------+---------+-----------+----------+--------------+ FV Prox  Full                                                        +---------+---------------+---------+-----------+----------+--------------+ FV Mid   Full                                                        +---------+---------------+---------+-----------+----------+--------------+ FV DistalFull                                                        +---------+---------------+---------+-----------+----------+--------------+  PFV      Full                                                        +---------+---------------+---------+-----------+----------+--------------+ POP      Full           Yes      Yes                                 +---------+---------------+---------+-----------+----------+--------------+ PTV      Full                                                        +---------+---------------+---------+-----------+----------+--------------+ PERO     Full                                                         +---------+---------------+---------+-----------+----------+--------------+   +---------+---------------+---------+-----------+----------+--------------+ LEFT     CompressibilityPhasicitySpontaneityPropertiesThrombus Aging +---------+---------------+---------+-----------+----------+--------------+ CFV      Full           Yes      Yes                                 +---------+---------------+---------+-----------+----------+--------------+ SFJ      Full                                                        +---------+---------------+---------+-----------+----------+--------------+ FV Prox  Full                                                        +---------+---------------+---------+-----------+----------+--------------+ FV Mid   Full                                                        +---------+---------------+---------+-----------+----------+--------------+ FV DistalFull                                                        +---------+---------------+---------+-----------+----------+--------------+ PFV      Full                                                        +---------+---------------+---------+-----------+----------+--------------+  POP      Full           Yes      Yes                                 +---------+---------------+---------+-----------+----------+--------------+ PTV      Full                                                        +---------+---------------+---------+-----------+----------+--------------+ PERO     Full                                                        +---------+---------------+---------+-----------+----------+--------------+     Summary: RIGHT: - There is no evidence of deep vein thrombosis in the lower extremity.  - No cystic structure found in the popliteal fossa.  LEFT: - There is no evidence of deep vein thrombosis in the lower extremity.  - No  cystic structure found in the popliteal fossa.  *See table(s) above for measurements and observations. Electronically signed by Ruta Hinds MD on 12/22/2020 at 5:35:21 PM.    Final         Scheduled Meds: . atorvastatin  40 mg Oral Daily  . fluticasone  2 spray Each Nare QHS  . metoprolol tartrate  25 mg Oral BID  . pantoprazole  40 mg Oral Daily   Continuous Infusions: . sodium chloride Stopped (12/23/20 2247)  . cefTRIAXone (ROCEPHIN)  IV Stopped (12/23/20 2158)          Aline August, MD [facility] 12/24/2020, 7:30 AM

## 2020-12-24 NOTE — PMR Pre-admission (Shared)
PMR Admission Coordinator Pre-Admission Assessment  Patient: Yolanda Flowers is an 80 y.o., female MRN: [id number] DOB: 1940-10-08 Height: '5\' 6"'  (167.6 cm) Weight: 77.1 kg  Insurance Information HMO:     PPO: yes     PCP:      IPA:      80/20:      OTHER:  PRIMARY: [employer]     Policy#: [id number]     Subscriber:  CM Name: ***      Phone#: ***     Fax#: *** Pre-Cert#: ***      Employer: *** Benefits:  Phone #: ***     Name: Irene Shipper Date: 08/20/2015 - still active Deductible: no deductible ($0) OOP Max: $3,450 ($ 105 met) CIR: $325/day co-pay for days 1-6, $0/day days 7-90 SNF:  $0/day co-pay for days 1-20, $184/day co-pay for days 21-100; limited to 100 days/benefit period Outpatient: $15/visit co-pay; limited by medical necessity Home Health:  80% coverage; 20% co-insurance DME: 80% coverage; 20% co-insurance Providers: in network  SECONDARY:none       Policy#:      Phone#:   Development worker, community:       Phone#:   [employer], [employer]" for patients in Inpatient Rehabilitation Facilities with attached "Privacy Act [place] Records" was provided and verbally reviewed with: Patient  Emergency Contact Information Contact Information    Name Relation Home Work Mobile   Kalina,[name] Daughter [phone]     VOLLIE, AARON   [phone]      Current Medical History  Patient Admitting Diagnosis: SDH History of Present Illness: 80 year old female with history of essential thrombocytosis currently no longer on hydroxyurea, chronic leukocytosis, chronic subdural hematoma (left-sided since 2008), melanoma, vulvar intraepithelial neoplasia, GERD, CAD status post CABG in 3/21, hypertension, COPD, hyperlipidemia and ischemic stroke (thought to be cardioembolic but source never identified-in 11/2019) presented with withfall. PT presented to the ED 12/21/2020 after experiencing progressively worsening cough, headache and frequent falls.and CT of the  brain showed subdural hematoma measuring 10 mm in thickness. Neurosurgery recommended conservative management and repeat CT of the brain in the morning. She was also found to have evidence of multilobar pneumonia. COVID-19 test was negative. She was started on IV antibiotics. She was found to have scattered embolic lacunar infarcts on MRI of the brain. Neurology was consulted. Repeat CT of the brain was negative for worsening of the subdural bleeding. Neurosurgery signed off. She is not a candidate for any antiplatelets or anticoagulation because of subdural hematoma and neurology recommended outpatient follow-up. CIR was consulted to assist in return to PLOF    Patient[facility] has been reviewed by the rehabilitation admission coordinator and physician.  Past Medical History  Past Medical History:  Diagnosis Date  . Abdominal pain   . Accelerating angina ([place]) 08/26/2019  . Acute blood loss anemia 12/14/2019  . Allergic rhinitis 06/21/2014  . Anxiety   . Arthritis   . Asthma   . Atherosclerosis of arteries 08/24/2019  . Bilateral edema of lower extremity   . Body mass index 29.0-29.9, adult 08/24/2019  . CAD (coronary artery disease) 10/25/2019  . Cellulitis of right leg   . Combined hyperlipidemia 08/24/2019  . Complication of anesthesia    difficulty waking  . Congestive heart failure (CHF) ([place])   . COPD (chronic obstructive pulmonary disease) ([place])   . Coronary artery disease   . Dyspnea 02/09/2019  . Dyspnea on exertion   . Embolic stroke (  [place]) 11/30/2019  . Essential thrombocythemia ([place]) 06/22/2014  . Essential thrombocytosis ([place]) 06/22/2014  . Gastrointestinal bleeding 06/23/2018  . GERD (gastroesophageal reflux disease)   . History of melanoma excision    left greast toe 2015  . History of squamous cell carcinoma excision    face--  multiple excisions  . History of subdural hematoma    2008  . HTN (hypertension) 06/21/2014  .  Hypertension   . Ischial bursitis, right 08/24/2019  . Leucocytosis 12/14/2019  . Malignant melanoma of great toe ([place]) 08/24/2019  . Melanoma in situ ([place]) 06/29/2014  . OSA (obstructive sleep apnea)    intolerant  . Pedal edema 08/24/2019  . Renal insufficiency 08/24/2019  . S/P CABG x 4 11/04/2019  . Skin cancer 08/24/2019  . Solitary pulmonary nodule on lung CT 02/09/2019  . Squamous cell carcinoma, scalp/neck 08/24/2019  . Stroke due to embolism ([place]) 11/25/2019  . Swelling of limb 08/24/2019  . Thrombocytosis    takes hydoxyurea--  MONITORED BY DR Hinton Rao [facility])  . VIN III (vulvar intraepithelial neoplasia III)   . Vitamin B 12 deficiency 08/24/2019  . Vitamin D deficiency 08/24/2019  . Wears dentures    UPPER AND LOWER PARTIAL  . Wears glasses     Family History   family history includes Arthritis in her brother, father, mother, and sister; Asthma in her father; Clotting disorder in her mother; Emphysema in her father; Heart disease in her brother, father, mother, and sister; Hypertension in her brother, father, mother, and sister; Kidney failure in her father; Stroke in her father.  Prior Rehab/Hospitalizations Has the patient had prior rehab or hospitalizations prior to admission? Yes  Has the patient had major surgery during 100 days prior to admission? No   Current Medications  Current Facility-Administered Medications:  .  0.9 %  sodium chloride infusion, , Intravenous, PRN, Aline August, MD, Stopped at 12/23/20 2247 .  acetaminophen (TYLENOL) tablet 650 mg, 650 mg, Oral, Q4H PRN **OR** acetaminophen (TYLENOL) 160 MG/5ML solution 650 mg, 650 mg, Per Tube, Q4H PRN **OR** acetaminophen (TYLENOL) suppository 650 mg, 650 mg, Rectal, Q4H PRN, Shalhoub, Sherryll Burger, MD .  atorvastatin (LIPITOR) tablet 40 mg, 40 mg, Oral, Daily, Rosalin Hawking, MD, 40 mg at 12/24/20 0900 .  cefTRIAXone (ROCEPHIN) 2 g in sodium chloride 0.9 % 100 mL IVPB, 2 g, Intravenous, Q24H, Shalhoub, Sherryll Burger,  MD, Stopped at 12/23/20 2158 .  fluticasone (FLONASE) 50 MCG/ACT nasal spray 2 spray, 2 spray, Each Nare, QHS, Shalhoub, Sherryll Burger, MD, 2 spray at 12/23/20 2110 .  hydrALAZINE (APRESOLINE) injection 10 mg, 10 mg, Intravenous, Q6H PRN, Shalhoub, Sherryll Burger, MD .  ipratropium-albuterol (DUONEB) 0.5-2.5 (3) MG/3ML nebulizer solution 3 mL, 3 mL, Nebulization, Q4H PRN, Shalhoub, Sherryll Burger, MD .  metoprolol tartrate (LOPRESSOR) tablet 25 mg, 25 mg, Oral, BID, Shalhoub, Sherryll Burger, MD, 25 mg at 12/24/20 0900 .  ondansetron (ZOFRAN) injection 4 mg, 4 mg, Intravenous, Q6H PRN, Shalhoub, Sherryll Burger, MD .  pantoprazole (PROTONIX) EC tablet 40 mg, 40 mg, Oral, Daily, Shalhoub, Sherryll Burger, MD, 40 mg at 12/24/20 0649 .  polyethylene glycol (MIRALAX / GLYCOLAX) packet 17 g, 17 g, Oral, Daily PRN, Alekh, Kshitiz, MD .  senna-docusate (Senokot-S) tablet 1 tablet, 1 tablet, Oral, Daily PRN, Starla Link, Kshitiz, MD .  traMADol (ULTRAM) tablet 50 mg, 50 mg, Oral, Q6H PRN, Shalhoub, Sherryll Burger, MD  Patients Current Diet:  Diet Order  Diet Heart Room service appropriate? Yes; Fluid consistency: Thin  Diet effective now                 Precautions / Restrictions Precautions Precautions: Fall Precaution Comments: Multiple falls Restrictions Weight Bearing Restrictions: No   Has the patient had 2 or more falls or a fall with injury in the past year? Yes  Prior Activity Level Community (5-7x/wk): Pt. was active in the community PTA  Prior Functional Level Self Care: Did the patient need help bathing, dressing, using the toilet or eating? Independent  Indoor Mobility: Did the patient need assistance with walking from room to room (with or without device)? Independent  Stairs: Did the patient need assistance with internal or external stairs (with or without device)? Independent  Functional Cognition: Did the patient need help planning regular tasks such as shopping or remembering to take medications?  Independent  Home Assistive Devices / Equipment Home Equipment: Shower seat,Walker - 2 wheels,Cane - single point  Prior Device Use: Indicate devices/aids used by the patient prior to current illness, exacerbation or injury? None of the above  Current Functional Level Cognition  Arousal/Alertness: Awake/alert Overall Cognitive Status: No family/caregiver present to determine baseline cognitive functioning Orientation Level: Oriented X4 General Comments: A and O X4 Attention: Focused,Sustained Focused Attention: Appears intact Sustained Attention: Appears intact Memory: Appears intact (Immediate: 5/5; delayed: 5/5) Awareness: Appears intact Problem Solving: Appears intact (Safety: 4/4) Executive Function: Reasoning,Sequencing,Organizing Reasoning: Appears intact Sequencing: Impaired Sequencing Impairment: Verbal complex (Clock drawing 4/4 with self correction) Organizing: Appears intact (Backward digit span up to 4: 3/3)    Extremity Assessment (includes Sensation/Coordination)  Upper Extremity Assessment: Overall WFL for tasks assessed  Lower Extremity Assessment: Generalized weakness    ADLs       Mobility  Overal bed mobility: Needs Assistance Bed Mobility: Sit to Supine Supine to sit: Min guard,HOB elevated Sit to supine: Min assist General bed mobility comments: assist for trunk guidance    Transfers  Overall transfer level: Needs assistance Equipment used: Rolling walker (2 wheeled) Transfers: Sit to/from [employer] Sit to Stand: Min assist Stand pivot transfers: Min assist General transfer comment: minA for boost up and steadying from recliner. No complaints of dizziness. Sit to stnad x 4 during session    Ambulation / Gait / Stairs / Wheelchair Mobility  Ambulation/Gait General Gait Details: deferred due to complaints of fatigue    Posture / Balance Dynamic Sitting Balance Sitting balance - Comments: SBP in sitting 133 Balance Overall  balance assessment: Needs assistance Sitting-balance support: No upper extremity supported,Feet supported Sitting balance-Leahy Scale: Fair Sitting balance - Comments: SBP in sitting 133 Standing balance support: Bilateral upper extremity supported,During functional activity Standing balance-Leahy Scale: Poor Standing balance comment: Reliant on BUE and external support High Level Balance Comments: SBP in stand 115    Special needs/care consideration Skin ***   Previous Home Environment (from acute therapy documentation) Living Arrangements: Alone  Lives With: Alone Available Help at Discharge: Family,Available 24 hours/day Type of Home: House Home Layout: One level Home Access: Stairs to enter Entrance Stairs-Rails: None Entrance Stairs-Number of Steps: 1 Bathroom Shower/Tub: Art therapist: Handicapped height Bathroom Accessibility: Yes How Accessible: Accessible via wheelchair,Accessible via walker  Discharge Living Setting Plans for Discharge Living Setting: Patient's home Type of Home at Discharge: House Discharge Home Layout: One level Discharge Home Access: Stairs to enter Entrance Stairs-Rails: None Entrance Stairs-Number of Steps: 1 Discharge Bathroom Shower/Tub: Tub/shower unit Discharge Bathroom Toilet:  Handicapped height Discharge Bathroom Accessibility: Yes How Accessible: Accessible via walker,Accessible via wheelchair  Social/Family/Support Systems Patient Roles: Other (Comment) Contact Information: [phone] Anticipated Caregiver: Zeriah Baysinger Anticipated Caregiver's Contact Information: [phone] Ability/Limitations of Caregiver: Can provide min A Caregiver Availability: 24/7 Discharge Plan Discussed with Primary Caregiver: Yes Is Caregiver In Agreement with Plan?: Yes Does Caregiver/Family have Issues with Lodging/Transportation while Pt is in Rehab?: No  Goals Patient/Family Goal for Rehab: PT/OT/SLP  Supervision Expected length of stay: 7-10 days Pt/Family Agrees to Admission and willing to participate: Yes Program Orientation Provided & Reviewed with Pt/Caregiver Including Roles  & Responsibilities: Yes  Decrease burden of Care through IP rehab admission: Specialzed equipment needs, Decrease number of caregivers, Bowel and bladder program and Patient/family education  Possible need for SNF placement upon discharge: not anticipated   Patient Condition: I have reviewed medical records from [facility] , spoken with CSW, and patient, son and daughter. I met with patient at the bedside for inpatient rehabilitation assessment.  Patient will benefit from ongoing PT, OT and SLP, can actively participate in 3 hours of therapy a day 5 days of the week, and can make measurable gains during the admission.  Patient will also benefit from the coordinated team approach during an Inpatient Acute Rehabilitation admission.  The patient will receive intensive therapy as well as Rehabilitation physician, nursing, social worker, and care management interventions.  Due to {due EL:3810175} the patient requires 24 hour a day rehabilitation nursing.  The patient is currently *** with mobility and basic ADLs.  Discharge setting and therapy post discharge at [facility] IP discharge location:[id number]} is anticipated.  Patient has agreed to participate in the Acute Inpatient Rehabilitation Program and will admit {Time; today/tomorrow:10263}.  Preadmission Screen Completed By:  Genella Mech, 12/24/2020 3:06 PM ______________________________________________________________________   Discussed status with Dr. Marland Kitchen on *** at *** and received approval for admission today.  Admission Coordinator:  Genella Mech, CCC-SLP, time ***Sudie Grumbling ***   Assessment/Plan: Diagnosis: 1. Does the need for close, 24 hr/day Medical supervision in concert with the patient's rehab needs make it unreasonable for this patient to be  served in a less intensive setting? {yes_no_potentially:3041433} 2. Co-Morbidities requiring supervision/potential complications: *** 3. Due to {due ZW:2585277}, does the patient require 24 hr/day rehab nursing? {yes_no_potentially:3041433} 4. Does the patient require coordinated care of a physician, rehab nurse, PT, OT, and SLP to address physical and functional deficits in the context of the above medical diagnosis(es)? {yes_no_potentially:3041433} Addressing deficits in the following areas: {deficits:3041436} 5. Can the patient actively participate in an intensive therapy program of at least 3 hrs of therapy 5 days a week? {yes_no_potentially:3041433} 6. The potential for patient to make measurable gains while on inpatient rehab is {potential:3041437} 7. Anticipated functional outcomes upon discharge from inpatient rehab: {functional outcomes:304600100} PT, {functional outcomes:304600100} OT, {functional outcomes:304600100} SLP 8. Estimated rehab length of stay to reach the above functional goals is: *** 9. Anticipated discharge destination: {anticipated dc setting:21604} 10. Overall Rehab/Functional Prognosis: {potential:3041437}   MD Signature: ***

## 2020-12-24 NOTE — Progress Notes (Signed)
Inpatient Rehab Admissions Coordinator:   Received a denial for CIR admission from pt.'s insurance. Family wishes to appeal this decision, so I will initiate that process.  Clemens Catholic, [place], [place] Admissions Coordinator  [phone] ([place]) [phone] (office)

## 2020-12-25 ENCOUNTER — Telehealth: Payer: Self-pay

## 2020-12-25 LAB — CULTURE, BLOOD (ROUTINE X 2)
Culture: NO GROWTH
Culture: NO GROWTH

## 2020-12-25 NOTE — Care Management Important Message (Signed)
Important Message  Patient Details  Name: Yolanda Flowers MRN: [id number] Date of Birth: March 09, 1941   Medicare Important Message Given:  Yes     Orbie Pyo 12/25/2020, 3:22 PM

## 2020-12-25 NOTE — Progress Notes (Signed)
[name], female   DOB: [birth year], 80 y.o.   MRN: [id number]  PROGRESS NOTE    VELEDA MUN  PYP:[id number] DOB: 07-Nov-1940 DOA: 12/20/2020 PCP: [name], MD   Brief Narrative:  80 year old female with history of essential thrombocytosis currently no longer on hydroxyurea, chronic leukocytosis, chronic subdural hematoma (left-sided since 2008), melanoma, vulvar intraepithelial neoplasia, GERD, CAD status post CABG in 3/21, hypertension, COPD, hyperlipidemia and ischemic stroke (thought to be cardioembolic but source never identified-in 11/2019) presented with with fall.  On presentation, CT of the brain showed subdural hematoma measuring 10 mm in thickness.  Neurosurgery recommended conservative management and repeat CT of the brain in the morning.  She was also found to have evidence of multilobar pneumonia.  COVID-19 test was negative.  She was started on IV antibiotics.  She was found to have scattered embolic lacunar infarcts on MRI of the brain.  Neurology was consulted.  Repeat CT of the brain was negative for worsening of the subdural bleeding.  Neurosurgery signed off.  She is not a candidate for any antiplatelets or anticoagulation because of subdural hematoma and neurology recommended outpatient follow-up.  PT recommended CIR placement.  Insurance denied CIR placement.  Family has appealed.  Assessment & Plan:   Subdural hematoma Fall at home -CT on presentation showed a small acute parafalcine subdural hematoma.  This is superimposed on patient's longstanding history of chronic subdural hematoma since 2008. -Repeat CT of the brain did not show worsening of subdural hematoma.  Neurosurgery recommended conservative management and signed off.  Outpatient follow-up with neurosurgery if needed  Acute cardioembolic stroke -MRI of the brain showed scattered embolic lacunar infarcts -Neurology evaluated the patient: She is not a candidate for anticoagulation or  antiplatelets because of subdural hematoma.  Dose of Lipitor has been decreased to 40 mg daily by neurology. -CTA head and neck showed no significant stenosis -2D echo with bubble showed EF of 65 to 70%, left atrium mildly dilated -CT chest/abdomen and pelvis was ordered by neurology to rule out malignancy: There was no evidence of malignancy on the CT -LDL 25.  A1c 5.8 -PT recommended CIR placement.    CIR has been consulted.  Insurance denied CIR placement.  Family has appealed the denial. -diet as per SLP recommendations  Community-acquired multilobar bacterial pneumonia -COVID-19 testing was negative on presentation.  Currently on Rocephin, finish 5-day course of Rocephin.  Completed 3-day course of Zithromax.  Currently on room air  ?  MDS Leukocytosis Thrombocytopenia Anemia of chronic disease: Possibly from above and other chronic illnesses -History of thrombocytosis with recent chronic thrombocytopenia and leukocytosis with concern for MDS.  Bone marrow biopsy planned as an outpatient soon. -Hemoglobin stable currently  CAD -Chest pain-free.  Continue statin.  Cannot continue antiplatelet because of subdural hematoma.  Outpatient follow-up with cardiology  GERD -Continue PPI  Hyperlipidemia -Statin plan as above   DVT prophylaxis: SCDs Code Status: DNR Family Communication: None at bedside Disposition Plan: Status is: Inpatient  Remains inpatient appropriate because:Inpatient level of care appropriate due to severity of illness   Dispo: The patient is from: Home              Anticipated d/c is to: CIR versus home with home health pending appeal of the denial              Patient currently is medically stable to d/c.   Difficult to place patient No  Consultants: Neurosurgery/neurology  Procedures: Echo  Antimicrobials:  Rocephin and Zithromax   Subjective: Patient seen and examined at bedside.  No overnight fever, vomiting or worsening shortness of breath  reported.  Still feels very weak.   Objective: Vitals:   12/24/20 1500 12/24/20 1945 12/24/20 2344 12/25/20 0329  BP: 129/71 (!) 149/85 139/80 134/90  Pulse: 70 78 70 68  Resp: '18 17 18 17  ' Temp:  98.6 F (37 C) 98.2 F (36.8 C) 98.3 F (36.8 C)  TempSrc:  Oral Oral Oral  SpO2: 94% 95% 94% 93%  Weight:      Height:        Intake/Output Summary (Last 24 hours) at 12/25/2020 0824 Last data filed at 12/25/2020 0648 Gross per 24 hour  Intake 407.97 ml  Output 900 ml  Net -492.03 ml   Filed Weights   12/20/20 1428  Weight: 77.1 kg    Examination:  General exam: No distress.  On room air currently.  Elderly female lying in bed.  Respiratory system: Decreased breath sounds at bases bilaterally with some scattered crackles  cardiovascular system: Rate controlled, S1-S2 heard Gastrointestinal system: Abdomen is mildly distended, soft and nontender.  Bowel sounds are heard  extremities: No edema or clubbing.   Data Reviewed: I have personally reviewed following labs and imaging studies  CBC: Recent Labs  Lab 12/20/20 1525 12/21/20 0236 12/22/20 0951  WBC 28.2* 23.2* 23.0*  NEUTROABS 25.7* 20.4* 17.3*  HGB 10.3* 9.5* 10.0*  HCT 33.8* 31.5* 32.4*  MCV 100.0 102.3* 99.7  PLT 172 154 932*   Basic Metabolic Panel: Recent Labs  Lab 12/20/20 1525 12/21/20 0236 12/22/20 0951  NA 138 138 137  K 4.5 4.3 4.4  CL 104 106 107  CO2 '23 22 24  ' GLUCOSE 99 97 103*  BUN '15 11 11  ' CREATININE 0.96 0.94 1.05*  CALCIUM 9.0 8.5* 8.1*  MG  --   --  2.3   GFR: Estimated Creatinine Clearance: 44.8 mL/min (A) (by C-G formula based on SCr of 1.05 mg/dL (H)). Liver Function Tests: Recent Labs  Lab 12/20/20 1525 12/21/20 0236  AST 17 13*  ALT 11 9  ALKPHOS 73 66  BILITOT 0.8 0.3  PROT 7.6 6.8  ALBUMIN 3.3* 3.0*   No results for input(s): LIPASE, AMYLASE in the last 168 hours. No results for input(s): AMMONIA in the last 168 hours. Coagulation Profile: Recent Labs  Lab  12/20/20 1525 12/21/20 0236  INR 1.2 1.3*   Cardiac Enzymes: Recent Labs  Lab 12/20/20 1525  CKTOTAL 115   BNP (last 3 results) No results for input(s): PROBNP in the last 8760 hours. HbA1C: No results for input(s): HGBA1C in the last 72 hours. CBG: No results for input(s): GLUCAP in the last 168 hours. Lipid Profile: No results for input(s): CHOL, HDL, LDLCALC, TRIG, CHOLHDL, LDLDIRECT in the last 72 hours. Thyroid Function Tests: No results for input(s): TSH, T4TOTAL, FREET4, T3FREE, THYROIDAB in the last 72 hours. Anemia Panel: No results for input(s): VITAMINB12, FOLATE, FERRITIN, TIBC, IRON, RETICCTPCT in the last 72 hours. Sepsis Labs: Recent Labs  Lab 12/20/20 1644 12/20/20 1815 12/21/20 0236  PROCALCITON  --   --  <0.10  LATICACIDVEN 1.2 1.1  --     Recent Results (from the past 240 hour(s))  Blood culture (routine x 2)     Status: None   Collection Time: 12/20/20  4:44 PM   Specimen: BLOOD  Result Value Ref Range Status   Specimen Description BLOOD SITE NOT SPECIFIED  Final   Special  Requests   Final    BOTTLES DRAWN AEROBIC AND ANAEROBIC Blood Culture results may not be optimal due to an inadequate volume of blood received in culture bottles   Culture   Final    NO GROWTH 5 DAYS Performed at [facility], [place] [address] [place], [place] [address]    Report Status 12/25/2020 FINAL  Final  Blood culture (routine x 2)     Status: None   Collection Time: 12/20/20  4:44 PM   Specimen: BLOOD  Result Value Ref Range Status   Specimen Description BLOOD SITE NOT SPECIFIED  Final   Special Requests   Final    BOTTLES DRAWN AEROBIC AND ANAEROBIC Blood Culture results may not be optimal due to an inadequate volume of blood received in culture bottles   Culture   Final    NO GROWTH 5 DAYS Performed at [facility], [place] [address], [place], [place] [address]    Report Status 12/25/2020 FINAL  Final  Resp Panel by RT-PCR (Flu A&B, Covid)  Nasopharyngeal Swab     Status: None   Collection Time: 12/20/20  4:44 PM   Specimen: Nasopharyngeal Swab; Nasopharyngeal(NP) swabs in vial transport medium  Result Value Ref Range Status   SARS Coronavirus 2 by RT PCR NEGATIVE NEGATIVE Final    Comment: (NOTE) SARS-CoV-2 target nucleic acids are NOT DETECTED.  The SARS-CoV-2 RNA is generally detectable in upper respiratory specimens during the acute phase of infection. The lowest concentration of SARS-CoV-2 viral copies this assay can detect is 138 copies/mL. A negative result does not preclude SARS-Cov-2 infection and should not be used as the sole basis for treatment or other patient management decisions. A negative result may occur with  improper specimen collection/handling, submission of specimen other than nasopharyngeal swab, presence of viral mutation(s) within the areas targeted by this assay, and inadequate number of viral copies(<138 copies/mL). A negative result must be combined with clinical observations, patient history, and epidemiological information. The expected result is Negative.  Fact Sheet for Patients:  EntrepreneurPulse.com.au  Fact Sheet for Healthcare Providers:  IncredibleEmployment.be  This test is no t yet approved or cleared by the [place] FDA and  has been authorized for detection and/or diagnosis of SARS-CoV-2 by FDA under an Emergency Use Authorization (EUA). This EUA will remain  in effect (meaning this test can be used) for the duration of the COVID-19 declaration under Section 564(b)(1) of the Act, 21 U.S.C.section 360bbb-3(b)(1), unless the authorization is terminated  or revoked sooner.       Influenza A by PCR NEGATIVE NEGATIVE Final   Influenza B by PCR NEGATIVE NEGATIVE Final    Comment: (NOTE) The Xpert Xpress SARS-CoV-2/FLU/RSV plus assay is intended as an aid in the diagnosis of influenza from Nasopharyngeal swab specimens and should not be  used as a sole basis for treatment. Nasal washings and aspirates are unacceptable for Xpert Xpress SARS-CoV-2/FLU/RSV testing.  Fact Sheet for Patients: EntrepreneurPulse.com.au  Fact Sheet for Healthcare Providers: IncredibleEmployment.be  This test is not yet approved or cleared by the [place] FDA and has been authorized for detection and/or diagnosis of SARS-CoV-2 by FDA under an Emergency Use Authorization (EUA). This EUA will remain in effect (meaning this test can be used) for the duration of the COVID-19 declaration under Section 564(b)(1) of the Act, 21 U.S.C. section 360bbb-3(b)(1), unless the authorization is terminated or revoked.  Performed at [facility], [place] [address] [place], [place] [address]  Urine culture     Status: Abnormal   Collection Time: 12/21/20 12:01 AM   Specimen: Urine, Random  Result Value Ref Range Status   Specimen Description URINE, RANDOM  Final   Special Requests NONE  Final   Culture (A)  Final    <10,000 COLONIES/mL INSIGNIFICANT GROWTH Performed at [facility], [address] [place], [place] [address]    Report Status 12/22/2020 FINAL  Final         Radiology Studies: No results found.      Scheduled Meds: . atorvastatin  40 mg Oral Daily  . fluticasone  2 spray Each Nare QHS  . metoprolol tartrate  25 mg Oral BID  . pantoprazole  40 mg Oral Daily   Continuous Infusions: . sodium chloride Stopped (12/24/20 2313)  . cefTRIAXone (ROCEPHIN)  IV Stopped (12/24/20 2133)          Aline August, MD [facility] 12/25/2020, 8:24 AM

## 2020-12-25 NOTE — Progress Notes (Signed)
Walked into pt's room to round on pt and pt was upset with RN because pt was under the assumption that this RN had something to do with insurance being denied. This RN explained to her that I just provided care and assistance. Pt was very angry and yelled that she did not want to hear anything that this RN had to say and refused RN care. Charge RN was notified of situation and Shaune Leeks resume care for pt to provided the best care possible and make the pt feel more comfortable.

## 2020-12-25 NOTE — Telephone Encounter (Signed)
-----   Message from Kelli A Mosher, PA-C sent at 12/19/2020  1:00 PM EDT ----- Please tell her vitamin B12 was low normal, so I would recommend she start on oral B12, 1000 mcg/d. Thanks  

## 2020-12-25 NOTE — Progress Notes (Signed)
Physical Therapy Treatment Patient Details Name: Yolanda Flowers MRN: [id number] DOB: 07-Apr-1941 Today's Date: 12/25/2020    History of Present Illness Pt is an 80 y/o female admitted secondary to multiple falls on 12/20/20. CT showed Acute parafalcine subdural hematoma and MRI showed Multifocal acute ischemia throughout both cerebral and cerebellar  hemispheres, in a pattern most suggestive of watershed infarcts. PMH includes CVA, COPD, and CAD s/p CABG.    PT Comments    Pt making good progress and able to tolerate therapy today.  She was able to ambulate 50'x2 with RW and min A.  Requiring cues for safety and posture.  Also participated with standing exercises and multiple transfers.  Spoke with pt's daughter who still wants to proceed with CIR appeal as she herself is not able to provide much physical assist but is willing to help in any way possible.  She wants the pt to receive as much therapy as possible and achieve higher level of mobility/safety before return home.     Follow Up Recommendations  CIR     Equipment Recommendations  3in1 (PT)    Recommendations for Other Services       Precautions / Restrictions Precautions Precautions: Fall Precaution Comments: Multiple falls Restrictions Weight Bearing Restrictions: No    Mobility  Bed Mobility Overal bed mobility: Needs Assistance Bed Mobility: Sit to Supine       Sit to supine: Min assist   General bed mobility comments: In chair at PT arrival, was min A with OT    Transfers Overall transfer level: Needs assistance Equipment used: Rolling walker (2 wheeled) Transfers: Sit to/from Stand Sit to Stand: Min guard Stand pivot transfers: Min assist       General transfer comment: Min guard to stand for safety; performed x 4 for transfers and x 10 in a row for exercise  Ambulation/Gait Ambulation/Gait assistance: Min assist Gait Distance (Feet): 50 Feet (50'x2) Assistive device: Rolling walker (2 wheeled) Gait  Pattern/deviations: Step-to pattern;Decreased stride length;Trunk flexed Gait velocity: decreased   General Gait Details: Ambulated 50'x2 with min A for RW and cues for posture and RW proximity; required 3 min seated rest break   Stairs             Wheelchair Mobility    Modified Rankin (Stroke Patients Only) Modified Rankin (Stroke Patients Only) Pre-Morbid Rankin Score: No significant disability Modified Rankin: Moderately severe disability     Balance Overall balance assessment: Needs assistance Sitting-balance support: No upper extremity supported;Feet supported Sitting balance-Leahy Scale: Good     Standing balance support: Bilateral upper extremity supported;During functional activity;No upper extremity supported Standing balance-Leahy Scale: Fair Standing balance comment: B UE for ambulation but could static stand no AD                            Cognition Arousal/Alertness: Awake/alert Behavior During Therapy: WFL for tasks assessed/performed Overall Cognitive Status: Within Functional Limits for tasks assessed                                        Exercises General Exercises - Lower Extremity Hip Flexion/Marching: AROM;Seated;Both;10 reps (with RW and min guard) Heel Raises: AROM;Both;10 reps;Standing (with RW and min guard) Other Exercises Other Exercises: sit to stand x 10 with min guard    General Comments General comments (skin integrity, edema, etc.): no  reports of dizziness today      Pertinent Vitals/Pain Pain Assessment: No/denies pain Faces Pain Scale: Hurts a little bit Pain Location: chronic back pain Pain Descriptors / Indicators: Aching;Constant Pain Intervention(s): Monitored during session;Repositioned    Home Living                      Prior Function            PT Goals (current goals can now be found in the care plan section) Acute Rehab PT Goals Patient Stated Goal: Be able to get  around without falling, get back to independent PT Goal Formulation: With patient Time For Goal Achievement: 01/04/21 Potential to Achieve Goals: Good Progress towards PT goals: Progressing toward goals    Frequency    Min 4X/week      PT Plan Current plan remains appropriate    Co-evaluation              AM-PAC PT "6 Clicks" Mobility   Outcome Measure  Help needed turning from your back to your side while in a flat bed without using bedrails?: A Little Help needed moving from lying on your back to sitting on the side of a flat bed without using bedrails?: A Little Help needed moving to and from a bed to a chair (including a wheelchair)?: A Little Help needed standing up from a chair using your arms (e.g., wheelchair or bedside chair)?: A Little Help needed to walk in hospital room?: A Little Help needed climbing 3-5 steps with a railing? : A Little 6 Click Score: 18    End of Session Equipment Utilized During Treatment: Gait belt Activity Tolerance: Patient tolerated treatment well Patient left: with call bell/phone within reach;with chair alarm set;in chair Nurse Communication: Mobility status PT Visit Diagnosis: Unsteadiness on feet (R26.81);Muscle weakness (generalized) (M62.81)     Time: 9518-8416 PT Time Calculation (min) (ACUTE ONLY): 32 min  Charges:  $Gait Training: 8-22 mins $Therapeutic Exercise: 8-22 mins                     Abran Richard, PT Acute Rehab Services Pager [phone] [facility] [place] 12/25/2020, 12:16 PM

## 2020-12-25 NOTE — Progress Notes (Signed)
Occupational Therapy Treatment Patient Details Name: Yolanda Flowers MRN: [id number] DOB: 1941/07/28 Today's Date: 12/25/2020    History of present illness Pt is an 80 y/o female admitted secondary to multiple falls. CT showed Acute parafalcine subdural hematoma and MRI showed Multifocal acute ischemia throughout both cerebral and cerebellar  hemispheres, in a pattern most suggestive of watershed infarcts. PMH includes CVA, COPD, and CAD s/p CABG.   OT comments  Pt progressing towards OT goals this session, decreased dizziness with transfers and change in position. Pt continues to be min A for transfers and in room mobility requiring cues for safety and assist for balance. She was finishing up full bath with NT, and was able to don socks min A, ambulate to bathroom, perform peri care in standing with min A and holding onto grab bar for safety. Then perform sink level grooming with min A. Due to Pt's independent PLOF, motivation level, good 24 hour family support- continue to recommend CIR level therapy post acute to maximize safety and independence in balance, activity tolerance, and ADL completion and transfers. This is essential for safety awareness, prevention of falls and would greatly reduce the risk of re-admission. OT will continue to follow acutely.   Follow Up Recommendations  CIR    Equipment Recommendations  3 in 1 bedside commode    Recommendations for Other Services      Precautions / Restrictions Precautions Precautions: Fall Precaution Comments: Multiple falls Restrictions Weight Bearing Restrictions: No       Mobility Bed Mobility Overal bed mobility: Needs Assistance Bed Mobility: Sit to Supine       Sit to supine: Min assist   General bed mobility comments: assist for trunk guidance    Transfers Overall transfer level: Needs assistance Equipment used: Rolling walker (2 wheeled) Transfers: Sit to/from [facility] Sit to Stand: Min  assist Stand pivot transfers: Min assist       General transfer comment: minA for boost up and steadying from EOB, regular height toilet. No complaints of dizziness.    Balance Overall balance assessment: Needs assistance Sitting-balance support: No upper extremity supported;Feet supported Sitting balance-Leahy Scale: Fair     Standing balance support: Bilateral upper extremity supported;During functional activity Standing balance-Leahy Scale: Poor Standing balance comment: Reliant on BUE and external support                           ADL either performed or assessed with clinical judgement   ADL Overall ADL's : Needs assistance/impaired Eating/Feeding: Set up;Sitting   Grooming: Wash/dry hands;Min guard;Standing Grooming Details (indicate cue type and reason): sink level Upper Body Bathing: Moderate assistance;Sitting Upper Body Bathing Details (indicate cue type and reason): for back Lower Body Bathing: Min guard;Sitting/lateral leans Lower Body Bathing Details (indicate cue type and reason): able to get down to feet Upper Body Dressing : Minimal assistance;Sitting Upper Body Dressing Details (indicate cue type and reason): to don gown Lower Body Dressing: Sitting/lateral leans;Minimal assistance Lower Body Dressing Details (indicate cue type and reason): able to don socks EOB min A for balance Toilet Transfer: Minimal assistance;Cueing for safety;Ambulation;RW;Regular Toilet;Grab bars Toilet Transfer Details (indicate cue type and reason): walking to bathroom, cues for safety with RW Toileting- Clothing Manipulation and Hygiene: Minimal assistance;Sit to/from stand Toileting - Clothing Manipulation Details (indicate cue type and reason): front peri care with min A for balance and use of grab bar to assist to stand Tub/ Shower Transfer: Minimal assistance  Functional mobility during ADLs: Minimal assistance;Cueing for safety;Rolling walker General ADL Comments:  decreased safety awareness, decreased balance, decreased independence and used to being able to perform independently     Vision   Additional Comments: 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   Perception     Praxis      Cognition Arousal/Alertness: Awake/alert Behavior During Therapy: WFL for tasks assessed/performed Overall Cognitive Status: No family/caregiver present to determine baseline cognitive functioning                                          Exercises     Shoulder Instructions       General Comments no reports of dizziness today    Pertinent Vitals/ Pain       Pain Assessment: Faces Faces Pain Scale: Hurts a little bit Pain Location: chronic back pain Pain Descriptors / Indicators: Aching;Constant Pain Intervention(s): Monitored during session;Repositioned  Home Living                                          Prior Functioning/Environment              Frequency  Min 2X/week        Progress Toward Goals  OT Goals(current goals can now be found in the care plan section)  Progress towards OT goals: Progressing toward goals  Acute Rehab OT Goals Patient Stated Goal: Be able to get around without falling, get back to independent OT Goal Formulation: With patient Time For Goal Achievement: 01/05/21 Potential to Achieve Goals: Good  Plan Discharge plan remains appropriate;Frequency remains appropriate    Co-evaluation                 AM-PAC OT "6 Clicks" Daily Activity     Outcome Measure   Help from another person eating meals?: None Help from another person taking care of personal grooming?: A Little Help from another person toileting, which includes using toliet, bedpan, or urinal?: A Little Help from another person bathing (including washing, rinsing, drying)?: A Little Help from another person to put on and taking off regular upper body clothing?: A Little Help from  another person to put on and taking off regular lower body clothing?: A Little 6 Click Score: 19    End of Session Equipment Utilized During Treatment: Rolling walker  OT Visit Diagnosis: Unsteadiness on feet (R26.81);Muscle weakness (generalized) (M62.81);Dizziness and giddiness (R42)   Activity Tolerance Patient tolerated treatment well   Patient Left in chair;with call bell/phone within reach   Nurse Communication Mobility status        Time: 0258-5277 OT Time Calculation (min): 33 min  Charges: OT General Charges $OT Visit: 1 Visit OT Treatments $Self Care/Home Management : 23-37 mins  Jesse Sans OTR/L Acute Rehabilitation Services Pager: [phone] Office: [place] 12/25/2020, 11:17 AM

## 2020-12-26 ENCOUNTER — Inpatient Hospital Stay (HOSPITAL_COMMUNITY): Payer: PPO

## 2020-12-26 IMAGING — CT CT HEAD W/O CM
3 series · 16 of 47 positions shown, 19 images · non-contrast
Comparison: [DATE]

CLINICAL DATA: Recent fall with follow-up subdural hematoma.

EXAM:
CT HEAD WITHOUT CONTRAST
TECHNIQUE: Contiguous axial images were obtained from the base of the skull
through the vertex without intravenous contrast.

[Series 3: head 5.0 h30s · axial · 0.43mm/px · z∈[-244,-74]mm · 10 of 40 slices shown, 13 images]
[im 3/40  brain]
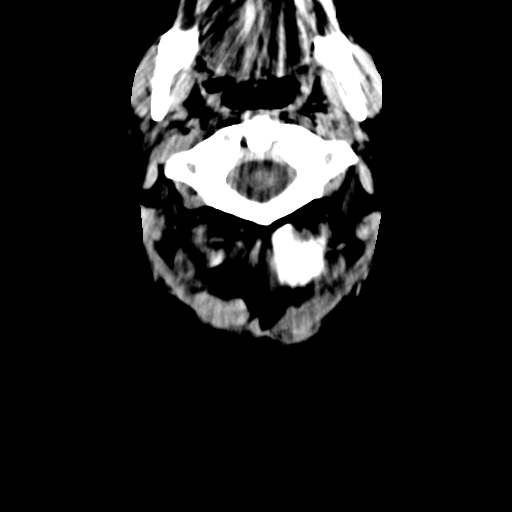
[im 3/40  bone]
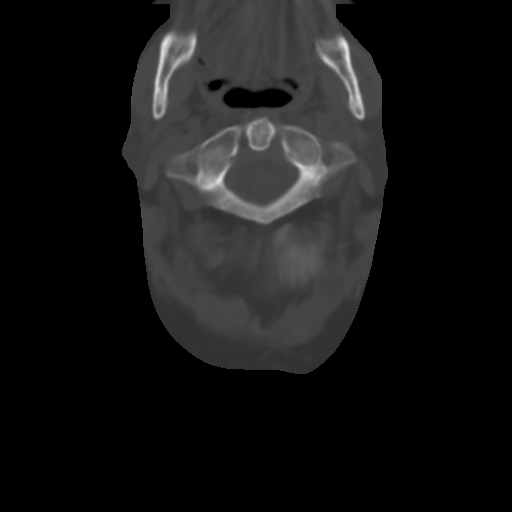
[im 7/40  brain]
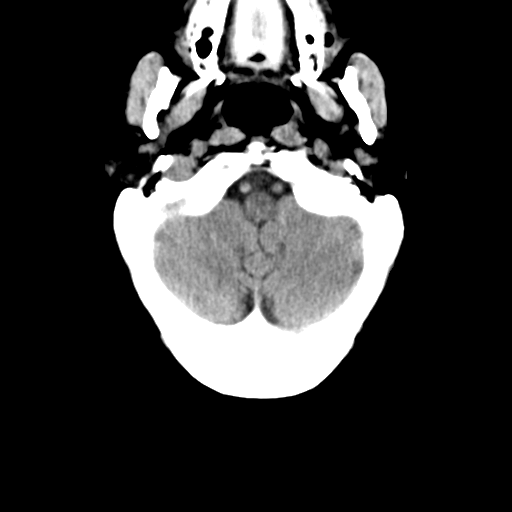
[im 11/40  brain]
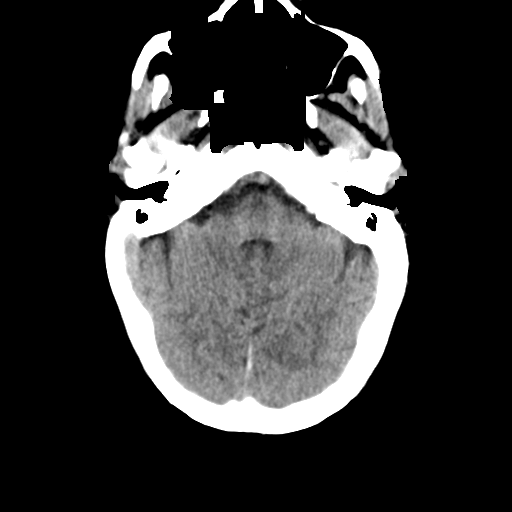
[im 14/40  brain]
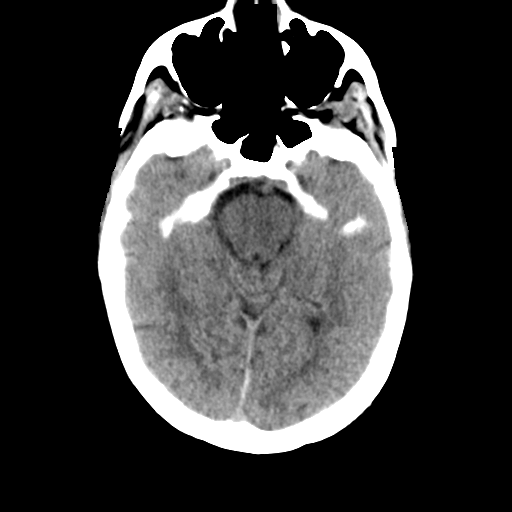
[im 18/40  brain]
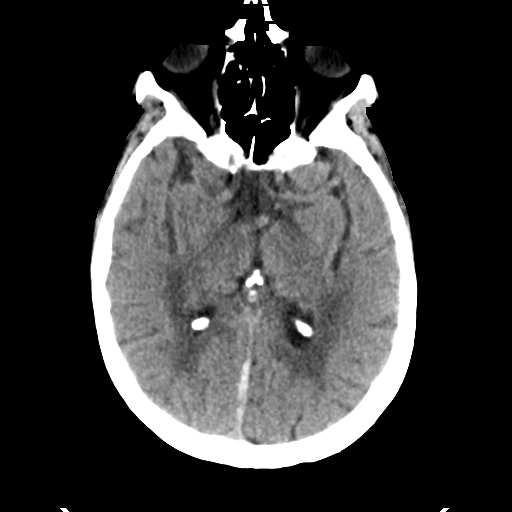
[im 18/40  bone]
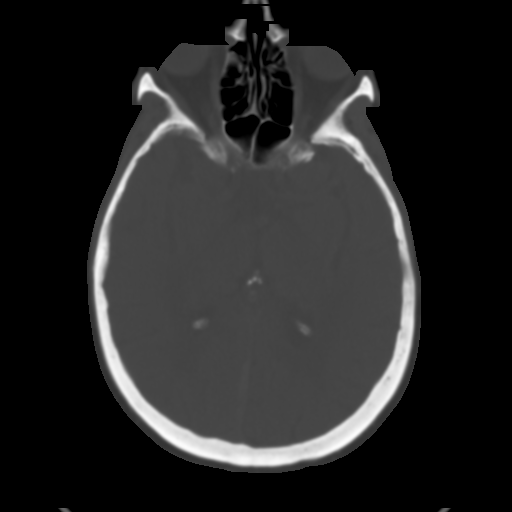
[im 22/40  brain]
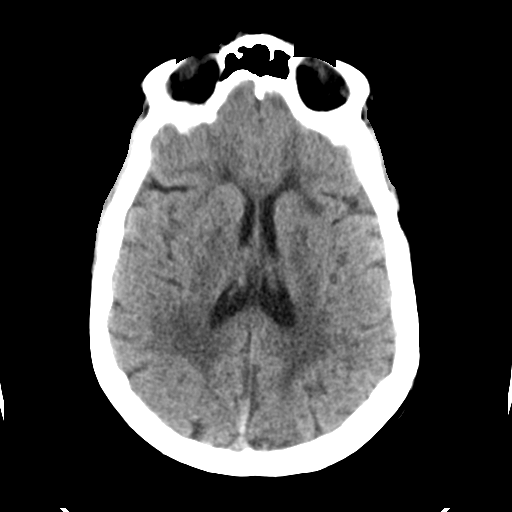
[im 26/40  brain]
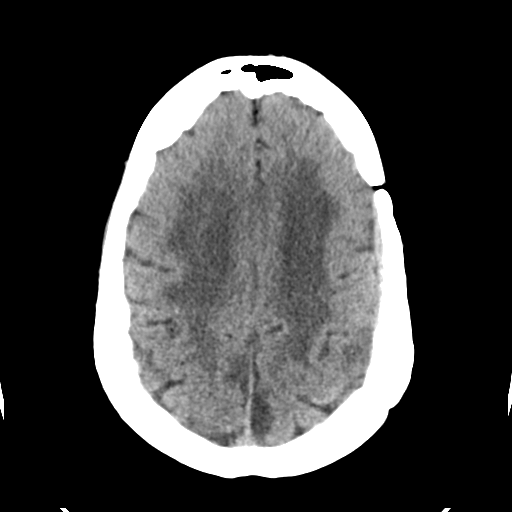
[im 30/40  brain]
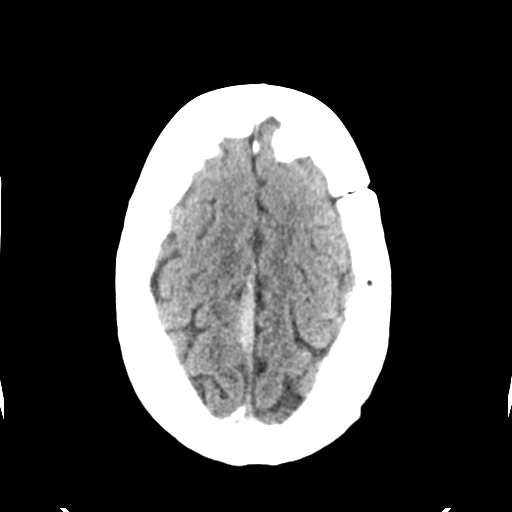
[im 33/40  brain]
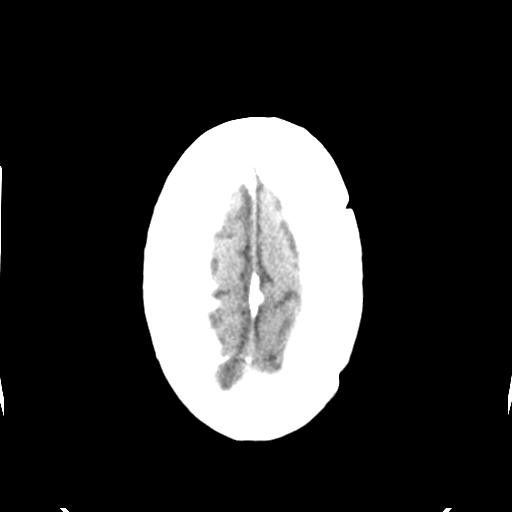
[im 33/40  bone]
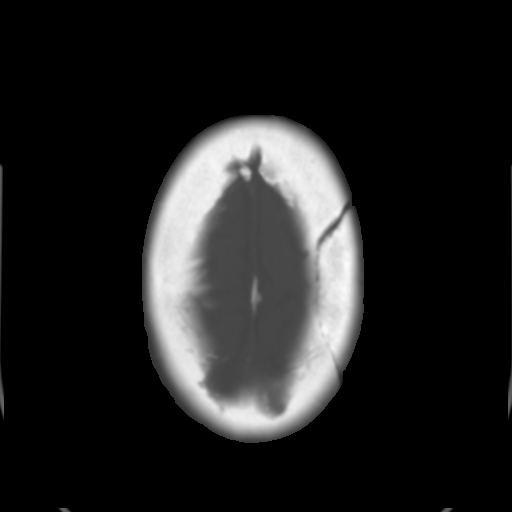
[im 37/40  brain]
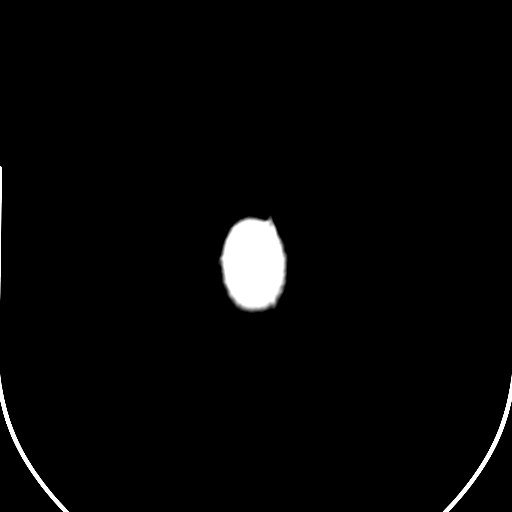

[Series 5: head 3.0 mpr cor · coronal · 0.36mm/px · 3 of 71 slices shown]
[im 24/71  brain]
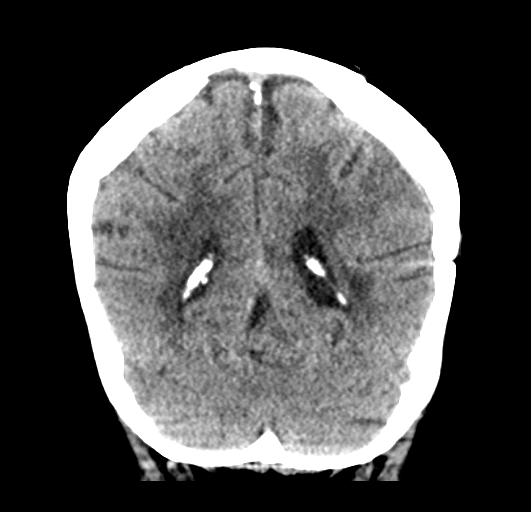
[im 32/71  brain]
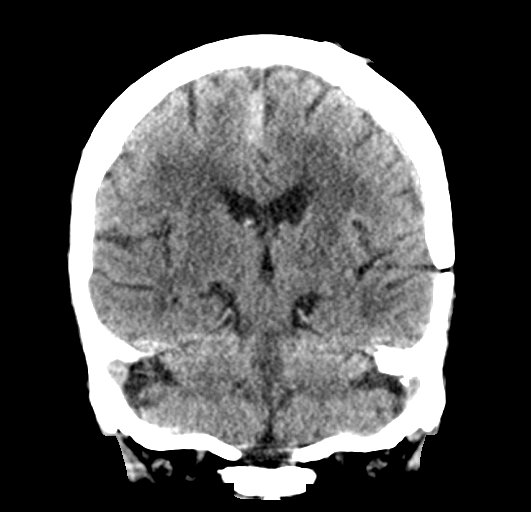
[im 39/71  brain]
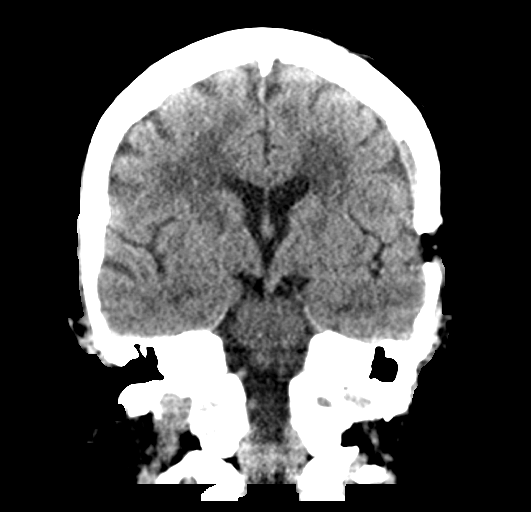

[Series 6: head 3.0 mpr sag · sagittal · 0.38mm/px · 3 of 52 slices shown]
[im 18/52  brain]
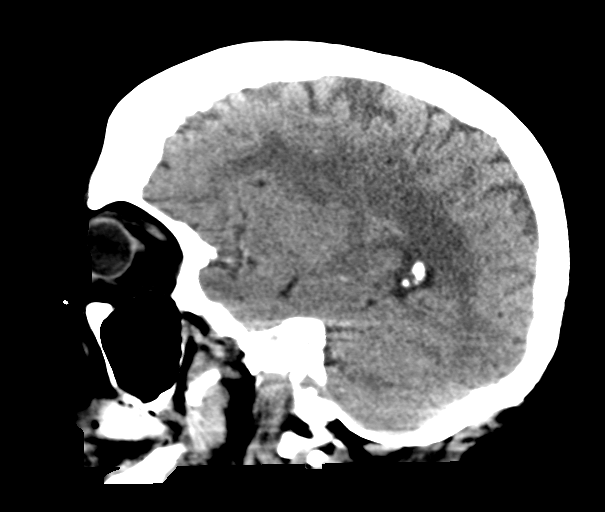
[im 26/52  brain]
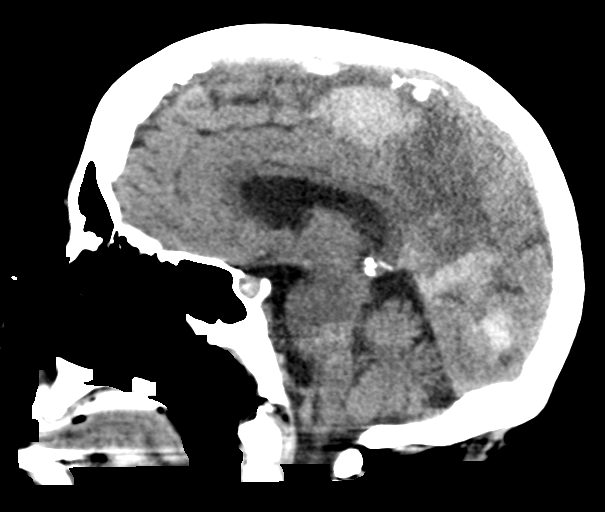
[im 35/52  brain]
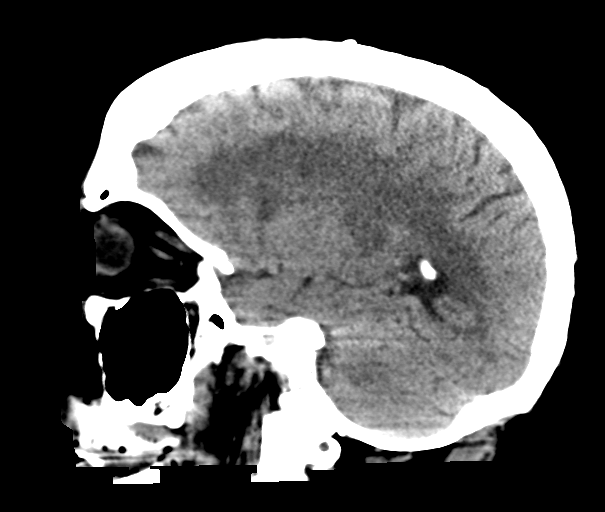

[16 of 47 positions shown; findings below may reference images not displayed]

FINDINGS: Brain: Previously seen inter hemispheric subdural hematoma is again
identified but less prominent when compared with the prior exam. It
measures approximately 7 mm in greatest thickness with only minimal
mass effect upon the adjacent right cerebral hemisphere. Chronic
white matter ischemic changes are seen. No findings to suggest acute
hemorrhage, acute infarction or space-occupying mass lesion are
noted.

Vascular: No hyperdense vessel or unexpected calcification.

Skull: Postsurgical changes on the left are again seen and stable.

Sinuses/Orbits: No acute finding.

Other: None.
IMPRESSION: Persistent inter hemispheric subdural hematoma with slight decreased
attenuation and slight decrease in thickness.

Chronic ischemic changes are again seen.

## 2020-12-26 NOTE — Progress Notes (Signed)
Physical Therapy Treatment Patient Details Name: Yolanda Flowers MRN: [id number] DOB: 27-Jul-1941 Today's Date: 12/26/2020    History of Present Illness Pt is an 80 y/o female admitted secondary to multiple falls on 12/20/20. CT showed Acute parafalcine subdural hematoma and MRI showed Multifocal acute ischemia throughout both cerebral and cerebellar  hemispheres, in a pattern most suggestive of watershed infarcts. PMH includes CVA, COPD, and CAD s/p CABG.    PT Comments    Pt demonstrating gradual progress. Able to increase gait distances with min A for safety.  She had c/o new R hand deficits that began last night after working with therapy and then sitting by cold window.  She had repeat CT that was negative.  Demonstrated decreased sensation, coordination, and proprioception in R hand - deficits were very mild.  Strength remained equal to L.  Encouraged use of R UE. Continue plan of care.    Follow Up Recommendations  CIR;Supervision/Assistance - 24 hour     Equipment Recommendations  3in1 (PT)    Recommendations for Other Services       Precautions / Restrictions Precautions Precautions: Fall Precaution Comments: Multiple falls    Mobility  Bed Mobility Overal bed mobility: Needs Assistance Bed Mobility: Sit to Supine     Supine to sit: Min guard     General bed mobility comments: increased time and use of rails but able to get to EOB without assist    Transfers Overall transfer level: Needs assistance Equipment used: Rolling walker (2 wheeled) Transfers: Sit to/from Stand Sit to Stand: Min guard         General transfer comment: Min guard to stand for safety;  Ambulation/Gait Ambulation/Gait assistance: Min assist Gait Distance (Feet): 100 Feet Assistive device: Rolling walker (2 wheeled) Gait Pattern/deviations: Step-to pattern;Decreased stride length;Trunk flexed Gait velocity: decreased   General Gait Details: Cues for RW proximity and posture; min A  with turns for stability   Stairs             Wheelchair Mobility    Modified Rankin (Stroke Patients Only) Modified Rankin (Stroke Patients Only) Pre-Morbid Rankin Score: No significant disability Modified Rankin: Moderately severe disability     Balance Overall balance assessment: Needs assistance Sitting-balance support: No upper extremity supported;Feet supported Sitting balance-Leahy Scale: Good     Standing balance support: Bilateral upper extremity supported;During functional activity;No upper extremity supported Standing balance-Leahy Scale: Fair Standing balance comment: B UE for ambulation but could static stand no AD                            Cognition Arousal/Alertness: Awake/alert Behavior During Therapy: WFL for tasks assessed/performed Overall Cognitive Status: Within Functional Limits for tasks assessed                                 General Comments: Does need some encouragment for therapy      Exercises      General Comments General comments (skin integrity, edema, etc.): Pt had new c/o decreased use of R hand.  She had repeat CT with no acute changes.  R UE and hand strength testing 5/5 , equal to L.  Demonstrated mild deficits in coordination with finger tip to tip at decreased speed and difficulty crumbling paper towel into both hands.  Decreased sensation and pins/needles R hand.  Also, seemed to have decreased proprioception when asked to  point to fingers on R with eyes closed.  Mild deficits - still able to use RW, comb hair, pick up items.  Encouraged to use R UE as much as possible and encouraged in fine motor task including crumbling the paper towel, writing, doing puzzles, eating finger tip to tip, etc.  Cautioned to use both UE with hot drinks.      Pertinent Vitals/Pain Pain Assessment: No/denies pain    Home Living                      Prior Function            PT Goals (current goals can now  be found in the care plan section) Acute Rehab PT Goals Patient Stated Goal: Be able to get around without falling, get back to independent PT Goal Formulation: With patient Time For Goal Achievement: 01/04/21 Potential to Achieve Goals: Good Progress towards PT goals: Progressing toward goals    Frequency    Min 4X/week      PT Plan Current plan remains appropriate    Co-evaluation              AM-PAC PT "6 Clicks" Mobility   Outcome Measure  Help needed turning from your back to your side while in a flat bed without using bedrails?: A Little Help needed moving from lying on your back to sitting on the side of a flat bed without using bedrails?: A Little Help needed moving to and from a bed to a chair (including a wheelchair)?: A Little Help needed standing up from a chair using your arms (e.g., wheelchair or bedside chair)?: A Little Help needed to walk in hospital room?: A Little Help needed climbing 3-5 steps with a railing? : A Little 6 Click Score: 18    End of Session Equipment Utilized During Treatment: Gait belt Activity Tolerance: Patient tolerated treatment well Patient left: in bed;with call bell/phone within reach;with bed alarm set Nurse Communication: Mobility status PT Visit Diagnosis: Unsteadiness on feet (R26.81);Muscle weakness (generalized) (M62.81)     Time: 0102-7253 PT Time Calculation (min) (ACUTE ONLY): 29 min  Charges:  $Gait Training: 8-22 mins $Therapeutic Activity: 8-22 mins                     Abran Richard, PT Acute Rehab Services Pager [phone] [facility] [place] 12/26/2020, 4:38 PM

## 2020-12-26 NOTE — Progress Notes (Signed)
[name], female   DOB: May 03, 1941, 80 y.o.   MRN: [id number]  PROGRESS NOTE    Yolanda Flowers  YSA:[id number] DOB: [birth year] DOA: 12/20/2020 PCP: [name], MD   Brief Narrative:  80 year old female with history of essential thrombocytosis currently no longer on hydroxyurea, chronic leukocytosis, chronic subdural hematoma (left-sided since 2008), melanoma, vulvar intraepithelial neoplasia, GERD, CAD status post CABG in 3/21, hypertension, COPD, hyperlipidemia and ischemic stroke (thought to be cardioembolic but source never identified-in 11/2019) presented with with fall.  On presentation, CT of the brain showed subdural hematoma measuring 10 mm in thickness.  Neurosurgery recommended conservative management and repeat CT of the brain in the morning.  She was also found to have evidence of multilobar pneumonia.  COVID-19 test was negative.  She was started on IV antibiotics.  She was found to have scattered embolic lacunar infarcts on MRI of the brain.  Neurology was consulted.  Repeat CT of the brain was negative for worsening of the subdural bleeding.  Neurosurgery signed off.  She is not a candidate for any antiplatelets or anticoagulation because of subdural hematoma and neurology recommended outpatient follow-up.  PT recommended CIR placement.  Insurance denied CIR placement.  Family has appealed.  Assessment & Plan:   Subdural hematoma Fall at home -CT on presentation showed a small acute parafalcine subdural hematoma.  This is superimposed on patient's longstanding history of chronic subdural hematoma since 2008. -Repeat CT of the brain did not show worsening of subdural hematoma.  Neurosurgery recommended conservative management and signed off.  Outpatient follow-up with neurosurgery if needed  Acute cardioembolic stroke -MRI of the brain showed scattered embolic lacunar infarcts -Neurology evaluated the patient: She is not a candidate for anticoagulation or  antiplatelets because of subdural hematoma.  Dose of Lipitor has been decreased to 40 mg daily by neurology. -CTA head and neck showed no significant stenosis -2D echo with bubble showed EF of 65 to 70%, left atrium mildly dilated -CT chest/abdomen and pelvis was ordered by neurology to rule out malignancy: There was no evidence of malignancy on the CT -LDL 25.  A1c 5.8 -PT still recommending CIR placement.  Insurance denied CIR placement.  Family has appealed the denial. -diet as per SLP recommendations -Patient states that her right wrist is more weak since yesterday and she is having difficulty time holding stuff like fork.  On exam though, she is able to hold stuff with her right hand without dropping.  will get CT of the head without contrast.  Continue physical therapy.  Community-acquired multilobar bacterial pneumonia -COVID-19 testing was negative on presentation.    Completed a course of Rocephin and Zithromax.  Currently on room air  ?  MDS Leukocytosis Thrombocytopenia Anemia of chronic disease: Possibly from above and other chronic illnesses -History of thrombocytosis with recent chronic thrombocytopenia and leukocytosis with concern for MDS.  Bone marrow biopsy planned as an outpatient soon. -Hemoglobin stable currently  CAD -Chest pain-free.  Continue statin.  Cannot continue antiplatelet because of subdural hematoma.  Outpatient follow-up with cardiology  GERD -Continue PPI  Hyperlipidemia -Statin plan as above   DVT prophylaxis: SCDs Code Status: DNR Family Communication: None at bedside Disposition Plan: Status is: Inpatient  Remains inpatient appropriate because:Inpatient level of care appropriate due to severity of illness   Dispo: The patient is from: Home              Anticipated d/c is to: CIR versus home with home health pending  appeal of the denial              Patient currently is medically stable to d/c.   Difficult to place patient  No  Consultants: Neurosurgery/neurology  Procedures: Echo  Antimicrobials: Rocephin and Zithromax   Subjective: Patient seen and examined at bedside.  No vomiting, worsening shortness of breath, fever reported.  Complains of increased right wrist weakness since yesterday afternoon.   Objective: Vitals:   12/25/20 1952 12/25/20 2335 12/26/20 0340 12/26/20 0745  BP: 130/81 (!) 123/96 130/84 140/86  Pulse: 72 70 93 68  Resp:    19  Temp: 97.7 F (36.5 C) 98 F (36.7 C) 98.6 F (37 C) 98.5 F (36.9 C)  TempSrc: Oral Oral Oral Oral  SpO2: 95%  91% 93%  Weight:      Height:        Intake/Output Summary (Last 24 hours) at 12/26/2020 0807 Last data filed at 12/26/2020 4268 Gross per 24 hour  Intake 386.84 ml  Output 150 ml  Net 236.84 ml   Filed Weights   12/20/20 1428  Weight: 77.1 kg    Examination:  General exam: On room air currently.  No acute distress.  Elderly female lying in bed.  Respiratory system: Bilateral decreased breath sounds at bases with some crackles  cardiovascular system: S1-S2 heard, rate controlled Gastrointestinal system: Abdomen is distended mildly, soft and nontender.  Bowel sounds are heard  extremities: Mild lower extremity edema present; no cyanosis CNS: No obvious worse weakness in the right upper extremity including the wrist.  Data Reviewed: I have personally reviewed following labs and imaging studies  CBC: Recent Labs  Lab 12/20/20 1525 12/21/20 0236 12/22/20 0951  WBC 28.2* 23.2* 23.0*  NEUTROABS 25.7* 20.4* 17.3*  HGB 10.3* 9.5* 10.0*  HCT 33.8* 31.5* 32.4*  MCV 100.0 102.3* 99.7  PLT 172 154 341*   Basic Metabolic Panel: Recent Labs  Lab 12/20/20 1525 12/21/20 0236 12/22/20 0951  NA 138 138 137  K 4.5 4.3 4.4  CL 104 106 107  CO2 '23 22 24  ' GLUCOSE 99 97 103*  BUN '15 11 11  ' CREATININE 0.96 0.94 1.05*  CALCIUM 9.0 8.5* 8.1*  MG  --   --  2.3   GFR: Estimated Creatinine Clearance: 44.8 mL/min (A) (by C-G formula  based on SCr of 1.05 mg/dL (H)). Liver Function Tests: Recent Labs  Lab 12/20/20 1525 12/21/20 0236  AST 17 13*  ALT 11 9  ALKPHOS 73 66  BILITOT 0.8 0.3  PROT 7.6 6.8  ALBUMIN 3.3* 3.0*   No results for input(s): LIPASE, AMYLASE in the last 168 hours. No results for input(s): AMMONIA in the last 168 hours. Coagulation Profile: Recent Labs  Lab 12/20/20 1525 12/21/20 0236  INR 1.2 1.3*   Cardiac Enzymes: Recent Labs  Lab 12/20/20 1525  CKTOTAL 115   BNP (last 3 results) No results for input(s): PROBNP in the last 8760 hours. HbA1C: No results for input(s): HGBA1C in the last 72 hours. CBG: No results for input(s): GLUCAP in the last 168 hours. Lipid Profile: No results for input(s): CHOL, HDL, LDLCALC, TRIG, CHOLHDL, LDLDIRECT in the last 72 hours. Thyroid Function Tests: No results for input(s): TSH, T4TOTAL, FREET4, T3FREE, THYROIDAB in the last 72 hours. Anemia Panel: No results for input(s): VITAMINB12, FOLATE, FERRITIN, TIBC, IRON, RETICCTPCT in the last 72 hours. Sepsis Labs: Recent Labs  Lab 12/20/20 1644 12/20/20 1815 12/21/20 0236  PROCALCITON  --   --  <0.10  LATICACIDVEN 1.2 1.1  --     Recent Results (from the past 240 hour(s))  Blood culture (routine x 2)     Status: None   Collection Time: 12/20/20  4:44 PM   Specimen: BLOOD  Result Value Ref Range Status   Specimen Description BLOOD SITE NOT SPECIFIED  Final   Special Requests   Final    BOTTLES DRAWN AEROBIC AND ANAEROBIC Blood Culture results may not be optimal due to an inadequate volume of blood received in culture bottles   Culture   Final    NO GROWTH 5 DAYS Performed at [facility], [place] [address], [place], [place] [address]    Report Status 12/25/2020 FINAL  Final  Blood culture (routine x 2)     Status: None   Collection Time: 12/20/20  4:44 PM   Specimen: BLOOD  Result Value Ref Range Status   Specimen Description BLOOD SITE NOT SPECIFIED  Final   Special Requests    Final    BOTTLES DRAWN AEROBIC AND ANAEROBIC Blood Culture results may not be optimal due to an inadequate volume of blood received in culture bottles   Culture   Final    NO GROWTH 5 DAYS Performed at [facility], [place] [address], [place], [place] [address]    Report Status 12/25/2020 FINAL  Final  Resp Panel by RT-PCR (Flu A&B, Covid) Nasopharyngeal Swab     Status: None   Collection Time: 12/20/20  4:44 PM   Specimen: Nasopharyngeal Swab; Nasopharyngeal(NP) swabs in vial transport medium  Result Value Ref Range Status   SARS Coronavirus 2 by RT PCR NEGATIVE NEGATIVE Final    Comment: (NOTE) SARS-CoV-2 target nucleic acids are NOT DETECTED.  The SARS-CoV-2 RNA is generally detectable in upper respiratory specimens during the acute phase of infection. The lowest concentration of SARS-CoV-2 viral copies this assay can detect is 138 copies/mL. A negative result does not preclude SARS-Cov-2 infection and should not be used as the sole basis for treatment or other patient management decisions. A negative result may occur with  improper specimen collection/handling, submission of specimen other than nasopharyngeal swab, presence of viral mutation(s) within the areas targeted by this assay, and inadequate number of viral copies(<138 copies/mL). A negative result must be combined with clinical observations, patient history, and epidemiological information. The expected result is Negative.  Fact Sheet for Patients:  EntrepreneurPulse.com.au  Fact Sheet for Healthcare Providers:  IncredibleEmployment.be  This test is no t yet approved or cleared by the [place] FDA and  has been authorized for detection and/or diagnosis of SARS-CoV-2 by FDA under an Emergency Use Authorization (EUA). This EUA will remain  in effect (meaning this test can be used) for the duration of the COVID-19 declaration under Section 564(b)(1) of the Act,  21 U.S.C.section 360bbb-3(b)(1), unless the authorization is terminated  or revoked sooner.       Influenza A by PCR NEGATIVE NEGATIVE Final   Influenza B by PCR NEGATIVE NEGATIVE Final    Comment: (NOTE) The Xpert Xpress SARS-CoV-2/FLU/RSV plus assay is intended as an aid in the diagnosis of influenza from Nasopharyngeal swab specimens and should not be used as a sole basis for treatment. Nasal washings and aspirates are unacceptable for Xpert Xpress SARS-CoV-2/FLU/RSV testing.  Fact Sheet for Patients: EntrepreneurPulse.com.au  Fact Sheet for Healthcare Providers: IncredibleEmployment.be  This test is not yet approved or cleared by the [place] FDA and has been authorized for detection and/or diagnosis of SARS-CoV-2  by FDA under an Emergency Use Authorization (EUA). This EUA will remain in effect (meaning this test can be used) for the duration of the COVID-19 declaration under Section 564(b)(1) of the Act, 21 U.S.C. section 360bbb-3(b)(1), unless the authorization is terminated or revoked.  Performed at [facility], [place] [address] [place], [place] [address]   Urine culture     Status: Abnormal   Collection Time: 12/21/20 12:01 AM   Specimen: Urine, Random  Result Value Ref Range Status   Specimen Description URINE, RANDOM  Final   Special Requests NONE  Final   Culture (A)  Final    <10,000 COLONIES/mL INSIGNIFICANT GROWTH Performed at [facility], [place] [address], [place],  [address]    Report Status 12/22/2020 FINAL  Final         Radiology Studies: No results found.      Scheduled Meds: . atorvastatin  40 mg Oral Daily  . fluticasone  2 spray Each Nare QHS  . metoprolol tartrate  25 mg Oral BID  . pantoprazole  40 mg Oral Daily   Continuous Infusions: . sodium chloride Stopped (12/26/20 0112)          Aline August, MD [facility] 12/26/2020, 8:07 AM

## 2020-12-26 NOTE — Progress Notes (Signed)
Inpatient Rehab Admissions Coordinator:   I continue to await decision on insurance appeal for CIR. Pt., family, MD, and TOC are aware. I continue to await their decision and will pursue for admit if our appeal is approved.   Clemens Catholic, [place], [place] Admissions Coordinator  [phone] ([place]) [phone] (office)

## 2020-12-27 ENCOUNTER — Telehealth: Payer: Self-pay

## 2020-12-27 MED ORDER — VITAMIN B-12 100 MCG PO TABS
500.0000 ug | ORAL_TABLET | Freq: Every day | ORAL | Status: DC
  Administered 2020-12-27 – 2020-12-29 (×3): 500 ug via ORAL
  Filled 2020-12-27 (×3): qty 5

## 2020-12-27 NOTE — TOC Transition Note (Signed)
Transition of Care [facility]) - CM/SW Discharge Note   Patient Details  Name: Yolanda Flowers MRN: [id number] Date of Birth: 09-07-1940  [facility] CM/SW Contact:  Ninfa Meeker, RN Phone Number: 12/27/2020, 2:01 PM   Clinical Narrative:  Case manager spoke with patients' daughter in law and son concerning discharge plan.Patient has been denied CIR admission. [name] and [name] state that they are not able to provide 24/7 care, [name] can assist but patient needs to be able to do majority of her self care and ambulate safely. [name] states she is going to file an appeal with [employer]. CM received a call from [name] with [facility] @ 2:03pm asking if patient has dc order, she does not. CM contacted PT/OT and requested patient be reevaluated for SNF placement. TOC Team will continue to monitor.           Patient Goals and CMS Choice        Discharge Placement                       Discharge Plan and Services                                     Social Determinants of Health (SDOH) Interventions     Readmission Risk Interventions No flowsheet data found.

## 2020-12-27 NOTE — Telephone Encounter (Signed)
Left generic message to return my call.

## 2020-12-27 NOTE — Progress Notes (Addendum)
Physical Therapy Treatment Patient Details Name: BRALEY LUCKENBAUGH MRN: [id number] DOB: 09-08-1940 Today's Date: 12/27/2020    History of Present Illness Pt is an 80 y/o female admitted secondary to multiple falls on 12/20/20. CT showed Acute parafalcine subdural hematoma and MRI showed Multifocal acute ischemia throughout both cerebral and cerebellar  hemispheres, in a pattern most suggestive of watershed infarcts. PMH includes CVA, COPD, and CAD s/p CABG.    PT Comments    Pt making good progress with goals met and updated.  She is worried about R hand.  Continues to have minor deficits with coordination and proprioception - encouraged use during therapy.  Pt's insurance has denied CIR and with pt's good progress today -PT updating recommendations to home with HHPT and supervision for OOB mobility initially.  Pt with good potential to progress back to independence with HHPT.     Follow Up Recommendations  Home health PT;Supervision for mobility/OOB - Family requesting SNF     Equipment Recommendations  3in1 (PT)    Recommendations for Other Services       Precautions / Restrictions Precautions Precautions: Fall Precaution Comments: Multiple falls    Mobility  Bed Mobility Overal bed mobility: Needs Assistance Bed Mobility: Sit to Supine;Supine to Sit     Supine to sit: Supervision Sit to supine: Supervision        Transfers Overall transfer level: Needs assistance Equipment used: Rolling walker (2 wheeled) Transfers: Sit to/from Stand Sit to Stand: Supervision         General transfer comment: Performed slowly but without assist  Ambulation/Gait Ambulation/Gait assistance: Supervision Gait Distance (Feet): 125 Feet (125'x2) Assistive device: Rolling walker (2 wheeled) Gait Pattern/deviations: Step-to pattern;Decreased stride length;Trunk flexed Gait velocity: decreased   General Gait Details: Good RW proximity without cues; min cues for posture.  Started Min  guard but progressed to supervision   Stairs Stairs: Yes Stairs assistance: Min guard Stair Management: One rail Left;With cane;Step to pattern Number of Stairs: 2 General stair comments: Performed slowly but safely with min guard   Wheelchair Mobility    Modified Rankin (Stroke Patients Only) Modified Rankin (Stroke Patients Only) Pre-Morbid Rankin Score: No significant disability Modified Rankin: Moderate disability     Balance Overall balance assessment: Needs assistance Sitting-balance support: No upper extremity supported;Feet supported Sitting balance-Leahy Scale: Good     Standing balance support: Bilateral upper extremity supported;No upper extremity supported Standing balance-Leahy Scale: Fair Standing balance comment: B UE for ambulation but could static stand no AD                            Cognition Arousal/Alertness: Awake/alert Behavior During Therapy: WFL for tasks assessed/performed Overall Cognitive Status: Within Functional Limits for tasks assessed                                        Exercises      General Comments General comments (skin integrity, edema, etc.):   Pt reports insurance denied CIR.  She is unsure if she wants SNF vs home with family (states leaving it up to them).  PT discussed good progress and feel that pt would benefit from HHPT and supervision for mobility with family as she is not needing physical assist and moving household distances now.      Pertinent Vitals/Pain Pain Assessment: No/denies pain    Home  Living                      Prior Function            PT Goals (current goals can now be found in the care plan section) Acute Rehab PT Goals Patient Stated Goal: Be able to get around without falling, get back to independent PT Goal Formulation: With patient Time For Goal Achievement: 01/10/21 Potential to Achieve Goals: Good Progress towards PT goals: Goals met and updated -  see care plan    Frequency    Min 4X/week      PT Plan Discharge plan needs to be updated    Co-evaluation              AM-PAC PT "6 Clicks" Mobility   Outcome Measure  Help needed turning from your back to your side while in a flat bed without using bedrails?: None Help needed moving from lying on your back to sitting on the side of a flat bed without using bedrails?: None Help needed moving to and from a bed to a chair (including a wheelchair)?: A Little Help needed standing up from a chair using your arms (e.g., wheelchair or bedside chair)?: A Little Help needed to walk in hospital room?: A Little Help needed climbing 3-5 steps with a railing? : A Little 6 Click Score: 20    End of Session Equipment Utilized During Treatment: Gait belt Activity Tolerance: Patient tolerated treatment well Patient left: in bed;with call bell/phone within reach;with bed alarm set Nurse Communication: Mobility status PT Visit Diagnosis: Unsteadiness on feet (R26.81);Muscle weakness (generalized) (M62.81)     Time: 0148-4039 PT Time Calculation (min) (ACUTE ONLY): 23 min  Charges:  $Gait Training: 23-37 mins                     Abran Richard, PT Acute Rehab Services Pager [phone] [facility] 12/27/2020, 12:37 PM

## 2020-12-27 NOTE — Progress Notes (Signed)
[name], female   DOB: [birth year], 80 y.o.   MRN: [id number]  PROGRESS NOTE    Yolanda Flowers  MBE:[id number] DOB: 1941-01-03 DOA: 12/20/2020 PCP: Yolanda Johns, MD   Brief Narrative:  80 year old female with history of essential thrombocytosis currently no longer on hydroxyurea, chronic leukocytosis, chronic subdural hematoma (left-sided since 2008), melanoma, vulvar intraepithelial neoplasia, GERD, CAD status post CABG in 3/21, hypertension, COPD, hyperlipidemia and ischemic stroke (thought to be cardioembolic but source never identified-in 11/2019) presented with with fall.  On presentation, CT of the brain showed subdural hematoma measuring 10 mm in thickness.  Neurosurgery recommended conservative management and repeat CT of the brain in the morning.  She was also found to have evidence of multilobar pneumonia.  COVID-19 test was negative.  She was started on IV antibiotics.  She was found to have scattered embolic lacunar infarcts on MRI of the brain.  Neurology was consulted.  Repeat CT of the brain was negative for worsening of the subdural bleeding.  Neurosurgery signed off.  She is not a candidate for any antiplatelets or anticoagulation because of subdural hematoma and neurology recommended outpatient follow-up.     Assessment & Plan:   Subdural hematoma She sustained a fall at home. -CT on presentation showed a small acute parafalcine subdural hematoma.  This is superimposed on patient's longstanding history of chronic subdural hematoma since 2008. -Repeat CT of the brain did not show worsening of subdural hematoma.   Neurosurgery recommended conservative management and signed off.  Outpatient follow-up with neurosurgery if needed  Acute cardioembolic stroke -MRI of the brain showed scattered embolic lacunar infarcts -Neurology evaluated the patient: She is not a candidate for anticoagulation or antiplatelets because of subdural hematoma.  Dose of Lipitor has been  decreased to 40 mg daily by neurology. -CTA head and neck showed no significant stenosis -2D echo with bubble showed EF of 65 to 70%, left atrium mildly dilated -CT chest/abdomen and pelvis was ordered by neurology to rule out malignancy: There was no evidence of malignancy on the CT -LDL 25.  A1c 5.8 -PT recommending CIR placement.  Insurance denied CIR placement.  Family has appealed the denial. Patient was seen by speech therapy and cleared for heart healthy diet. Patient mention weakness in the right wrist yesterday.  CT scan was repeated which does not show any acute changes.  Patient was reassured.  Was told about ongoing therapy which will be beneficial over the long run.  Community-acquired multilobar bacterial pneumonia -COVID-19 testing was negative on presentation.    Completed a course of Rocephin and Zithromax.  Currently on room air  Questionable myelodysplastic syndrome Leukocytosis Thrombocytopenia Anemia of chronic disease: Possibly from above and other chronic illnesses -History of thrombocytosis with recent chronic thrombocytopenia and leukocytosis with concern for MDS.  Bone marrow biopsy planned as an outpatient soon. Stable  Vitamin B12 deficiency Recently checked vitamin B12 level was 265.  Will start on supplementation.  Folic acid level was normal.  CAD -Chest pain-free.  Continue statin.  Cannot continue antiplatelet because of subdural hematoma.  Outpatient follow-up with cardiology  GERD -Continue PPI  Hyperlipidemia -Statin plan as above   DVT prophylaxis: SCDs Code Status: DNR Family Communication: None at bedside Disposition Plan: Inpatient rehabilitation has been denied by her insurance.  Looking into alternative disposition.  May need to go to SNF.  Status is: Inpatient  Remains inpatient appropriate because:Inpatient level of care appropriate due to severity of illness   Dispo: The  patient is from: Home              Anticipated d/c is  to: CIR versus home with home health pending appeal of the denial              Patient currently is medically stable to d/c.   Difficult to place patient No  Consultants: Neurosurgery/neurology  Procedures: Echo  Antimicrobials: Rocephin and Zithromax   Subjective: Patient feels well.  Concerned about weakness in her right wrist and fingers.    Objective: Vitals:   12/26/20 2330 12/27/20 0336 12/27/20 0742 12/27/20 1120  BP: 120/67 125/74 126/78 116/70  Pulse: 69 66 67 70  Resp: '17  20 18  ' Temp: 98.4 F (36.9 C) 98.3 F (36.8 C) 98.7 F (37.1 C) 97.9 F (36.6 C)  TempSrc: Oral Oral Oral Oral  SpO2: 93% 93% 94% 95%  Weight:      Height:        Intake/Output Summary (Last 24 hours) at 12/27/2020 1231 Last data filed at 12/27/2020 0924 Gross per 24 hour  Intake 600 ml  Output 550 ml  Net 50 ml   Filed Weights   12/20/20 1428  Weight: 77.1 kg    Examination:  General appearance: Awake alert.  In no distress Resp: Clear to auscultation bilaterally.  Normal effort Cardio: S1-S2 is normal regular.  No S3-S4.  No rubs murmurs or bruit GI: Abdomen is soft.  Nontender nondistended.  Bowel sounds are present normal.  No masses organomegaly Extremities: No edema.  Full range of motion of lower extremities. Neurologic: Alert and oriented x3.  Right-sided weakness noted..     Data Reviewed: I have personally reviewed following labs and imaging studies  CBC: Recent Labs  Lab 12/20/20 1525 12/21/20 0236 12/22/20 0951  WBC 28.2* 23.2* 23.0*  NEUTROABS 25.7* 20.4* 17.3*  HGB 10.3* 9.5* 10.0*  HCT 33.8* 31.5* 32.4*  MCV 100.0 102.3* 99.7  PLT 172 154 092*   Basic Metabolic Panel: Recent Labs  Lab 12/20/20 1525 12/21/20 0236 12/22/20 0951  NA 138 138 137  K 4.5 4.3 4.4  CL 104 106 107  CO2 '23 22 24  ' GLUCOSE 99 97 103*  BUN '15 11 11  ' CREATININE 0.96 0.94 1.05*  CALCIUM 9.0 8.5* 8.1*  MG  --   --  2.3   GFR: Estimated Creatinine Clearance: 44.8 mL/min (A)  (by C-G formula based on SCr of 1.05 mg/dL (H)). Liver Function Tests: Recent Labs  Lab 12/20/20 1525 12/21/20 0236  AST 17 13*  ALT 11 9  ALKPHOS 73 66  BILITOT 0.8 0.3  PROT 7.6 6.8  ALBUMIN 3.3* 3.0*   Coagulation Profile: Recent Labs  Lab 12/20/20 1525 12/21/20 0236  INR 1.2 1.3*   Cardiac Enzymes: Recent Labs  Lab 12/20/20 1525  CKTOTAL 115   Sepsis Labs: Recent Labs  Lab 12/20/20 1644 12/20/20 1815 12/21/20 0236  PROCALCITON  --   --  <0.10  LATICACIDVEN 1.2 1.1  --     Recent Results (from the past 240 hour(s))  Blood culture (routine x 2)     Status: None   Collection Time: 12/20/20  4:44 PM   Specimen: BLOOD  Result Value Ref Range Status   Specimen Description BLOOD SITE NOT SPECIFIED  Final   Special Requests   Final    BOTTLES DRAWN AEROBIC AND ANAEROBIC Blood Culture results may not be optimal due to an inadequate volume of blood received in culture bottles   Culture  Final    NO GROWTH 5 DAYS Performed at [facility], [place] [address], [place], [place] [address]    Report Status 12/25/2020 FINAL  Final  Blood culture (routine x 2)     Status: None   Collection Time: 12/20/20  4:44 PM   Specimen: BLOOD  Result Value Ref Range Status   Specimen Description BLOOD SITE NOT SPECIFIED  Final   Special Requests   Final    BOTTLES DRAWN AEROBIC AND ANAEROBIC Blood Culture results may not be optimal due to an inadequate volume of blood received in culture bottles   Culture   Final    NO GROWTH 5 DAYS Performed at [facility], [place] [address] [place], [place] [address]    Report Status 12/25/2020 FINAL  Final  Resp Panel by RT-PCR (Flu A&B, Covid) Nasopharyngeal Swab     Status: None   Collection Time: 12/20/20  4:44 PM   Specimen: Nasopharyngeal Swab; Nasopharyngeal(NP) swabs in vial transport medium  Result Value Ref Range Status   SARS Coronavirus 2 by RT PCR NEGATIVE NEGATIVE Final    Comment: (NOTE) SARS-CoV-2 target nucleic  acids are NOT DETECTED.  The SARS-CoV-2 RNA is generally detectable in upper respiratory specimens during the acute phase of infection. The lowest concentration of SARS-CoV-2 viral copies this assay can detect is 138 copies/mL. A negative result does not preclude SARS-Cov-2 infection and should not be used as the sole basis for treatment or other patient management decisions. A negative result may occur with  improper specimen collection/handling, submission of specimen other than nasopharyngeal swab, presence of viral mutation(s) within the areas targeted by this assay, and inadequate number of viral copies(<138 copies/mL). A negative result must be combined with clinical observations, patient history, and epidemiological information. The expected result is Negative.  Fact Sheet for Patients:  EntrepreneurPulse.com.au  Fact Sheet for Healthcare Providers:  IncredibleEmployment.be  This test is no t yet approved or cleared by the [place] FDA and  has been authorized for detection and/or diagnosis of SARS-CoV-2 by FDA under an Emergency Use Authorization (EUA). This EUA will remain  in effect (meaning this test can be used) for the duration of the COVID-19 declaration under Section 564(b)(1) of the Act, 21 U.S.C.section 360bbb-3(b)(1), unless the authorization is terminated  or revoked sooner.       Influenza A by PCR NEGATIVE NEGATIVE Final   Influenza B by PCR NEGATIVE NEGATIVE Final    Comment: (NOTE) The Xpert Xpress SARS-CoV-2/FLU/RSV plus assay is intended as an aid in the diagnosis of influenza from Nasopharyngeal swab specimens and should not be used as a sole basis for treatment. Nasal washings and aspirates are unacceptable for Xpert Xpress SARS-CoV-2/FLU/RSV testing.  Fact Sheet for Patients: EntrepreneurPulse.com.au  Fact Sheet for Healthcare Providers: IncredibleEmployment.be  This  test is not yet approved or cleared by the [place] FDA and has been authorized for detection and/or diagnosis of SARS-CoV-2 by FDA under an Emergency Use Authorization (EUA). This EUA will remain in effect (meaning this test can be used) for the duration of the COVID-19 declaration under Section 564(b)(1) of the Act, 21 U.S.C. section 360bbb-3(b)(1), unless the authorization is terminated or revoked.  Performed at [facility], [place] [address], [place], [place] [address]   Urine culture     Status: Abnormal   Collection Time: 12/21/20 12:01 AM   Specimen: Urine, Random  Result Value Ref Range Status   Specimen Description URINE, RANDOM  Final  Special Requests NONE  Final   Culture (A)  Final    <10,000 COLONIES/mL INSIGNIFICANT GROWTH Performed at [facility], [place] [address] [place], [place] [address]    Report Status 12/22/2020 FINAL  Final         Radiology Studies: CT HEAD WO CONTRAST  Result Date: 12/26/2020 CLINICAL DATA:  Recent fall with follow-up subdural hematoma. EXAM: CT HEAD WITHOUT CONTRAST TECHNIQUE: Contiguous axial images were obtained from the base of the skull through the vertex without intravenous contrast. COMPARISON:  12/21/2020 FINDINGS: Brain: Previously seen inter hemispheric subdural hematoma is again identified but less prominent when compared with the prior exam. It measures approximately 7 mm in greatest thickness with only minimal mass effect upon the adjacent right cerebral hemisphere. Chronic white matter ischemic changes are seen. No findings to suggest acute hemorrhage, acute infarction or space-occupying mass lesion are noted. Vascular: No hyperdense vessel or unexpected calcification. Skull: Postsurgical changes on the left are again seen and stable. Sinuses/Orbits: No acute finding. Other: None. IMPRESSION: Persistent inter hemispheric subdural hematoma with slight decreased attenuation and slight decrease in thickness. Chronic  ischemic changes are again seen. Electronically Signed   By: Inez Catalina M.D.   On: 12/26/2020 16:11        Scheduled Meds: . atorvastatin  40 mg Oral Daily  . fluticasone  2 spray Each Nare QHS  . metoprolol tartrate  25 mg Oral BID  . pantoprazole  40 mg Oral Daily  . vitamin B-12  500 mcg Oral Daily   Continuous Infusions: . sodium chloride Stopped (12/26/20 0112)       Bonnielee Haff, MD [facility] 12/27/2020, 12:31 PM

## 2020-12-27 NOTE — TOC Progression Note (Signed)
Transition of Care [facility]) - Progression Note    Patient Details  Name: IVERNA HAMMAC MRN: [id number] Date of Birth: 12/27/1940  Transition of Care [facility]) CM/SW Contact  Ross Ludwig, [place] Phone Number: 12/27/2020, 3:56 PM  Clinical Narrative:     CSW provided patient and her daughter with a list of SNFs for short term rehab in the [place] area.  CSW spoke to patient and family to discuss CIR denial.  CSW informed them when they speak to [facility] for the appeal, to emphasize the reasons patient is not safe to return back home.  CSW also advised them to discuss what her prior level of functioning was like.  CSW informed them if patient is still denied, the other option is to pay privately for SNF or go home with home health services.  CSW informed them that based on what was reported in therapy notes, it will be difficult to justify SNF or CIR placement.  CSW informed them of backup plans and to be prepared for another denial.   Expected Discharge Plan: Skilled Nursing Facility Barriers to Discharge: Continued Medical Work up  Expected Discharge Plan and Services Expected Discharge Plan: [place] In-house Referral: Clinical Social Work   Post Acute Care Choice: [place] Living arrangements for the past 2 months: Single Family Home                 DME Arranged: N/A DME Agency: NA       HH Arranged: NA HH Agency: NA         Social Determinants of Health (SDOH) Interventions    Readmission Risk Interventions No flowsheet data found.

## 2020-12-27 NOTE — Progress Notes (Signed)
Inpatient Rehab Admissions Coordinator:   [employer] has denied appeal for CIR admission. This was a final appeal, so CIR will sign off. Family, MD, and TOC notified.   Clemens Catholic, [place], [place] Admissions Coordinator  [phone] ([place]) [phone] (office)

## 2020-12-27 NOTE — Telephone Encounter (Signed)
-----   Message from Marvia Pickles, PA-C sent at 12/19/2020  1:00 PM EDT ----- Please tell her vitamin B12 was low normal, so I would recommend she start on oral B12, 1000 mcg/d. Thanks

## 2020-12-27 NOTE — [place] FL2 (Signed)
[place] MEDICAID FL2 LEVEL OF CARE SCREENING TOOL     IDENTIFICATION  Patient Name: Yolanda Flowers Birthdate: 11-05-1940 Sex: female Admission Date (Current Location): 12/20/2020  County and [place] Number:  [place] and Address:  [facility], [place] [address], [place], [place] [address]      Provider Number: [id number]  Attending Physician Name and Address:  Bonnielee Haff, MD  Relative Name and Phone Number:  Dwanda, [place] Daughter [phone]    Yolanda Flowers, Yolanda Flowers   [phone]    Current Level of Care: Hospital Recommended Level of Care: [place] Prior Approval Number:    Date Approved/Denied:   PASRR Number: [id number]  Discharge Plan: SNF    Current Diagnoses: Patient Active Problem List   Diagnosis Date Noted  . Fall at home, initial encounter 12/21/2020  . Stroke ([place]) 12/21/2020  . Pneumonia of both lungs due to infectious organism 12/20/2020  . Subdural hematoma ([place]) 12/20/2020  . Anxiety   . Arthritis   . Asthma   . Bilateral edema of lower extremity   . Complication of anesthesia   . Congestive heart failure (CHF) ([place])   . Coronary artery disease involving native heart without angina pectoris   . Dyspnea on exertion   . History of melanoma excision   . History of squamous cell carcinoma excision   . History of subdural hematoma   . Hypertension   . Thrombocytosis   . OSA (obstructive sleep apnea)   . Wears dentures   . Wears glasses   . Acute blood loss anemia 12/14/2019  . Leukocytosis 12/14/2019  . Acute cardioembolic stroke ([place]) 97/09/6376  . Cellulitis of right leg   . Abdominal pain   . Stroke due to embolism ([place]) 11/25/2019  . S/P CABG x 4 11/04/2019  . CAD (coronary artery disease) 10/25/2019  . Accelerating angina ([place]) 08/26/2019  . Vitamin D deficiency 08/24/2019  . Mixed hyperlipidemia 08/24/2019  . Body mass index 29.0-29.9, adult 08/24/2019  . Vitamin B 12 deficiency  08/24/2019  . Malignant melanoma of great toe ([place]) 08/24/2019  . Pedal edema 08/24/2019  . Skin cancer 08/24/2019  . Squamous cell carcinoma, scalp/neck 08/24/2019  . Ischial bursitis, right 08/24/2019  . Swelling of limb 08/24/2019  . Renal insufficiency 08/24/2019  . Atherosclerosis of arteries 08/24/2019  . Dyspnea 02/09/2019  . Solitary pulmonary nodule on lung CT 02/09/2019  . Gastrointestinal bleeding 06/23/2018  . VIN III (vulvar intraepithelial neoplasia III) 10/09/2015  . Melanoma in situ ([place]) 06/29/2014  . Essential thrombocythemia ([place]) 06/22/2014  . Essential thrombocytosis ([place]) 06/22/2014  . COPD (chronic obstructive pulmonary disease) ([place]) 06/21/2014  . HTN (hypertension) 06/21/2014  . Allergic rhinitis 06/21/2014  . GERD without esophagitis 06/21/2014    Orientation RESPIRATION BLADDER Height & Weight     Time,Situation,Place,Self  Normal External catheter Weight: 170 lb (77.1 kg) Height:  5\' 6"  (167.6 cm)  BEHAVIORAL SYMPTOMS/MOOD NEUROLOGICAL BOWEL NUTRITION STATUS      Continent Diet (Low Sodium Heart Healthy)  AMBULATORY STATUS COMMUNICATION OF NEEDS Skin   Limited Assist Verbally Normal                       Personal Care Assistance Level of Assistance  Bathing,Dressing,Feeding Bathing Assistance: Limited assistance Feeding assistance: Independent Dressing Assistance: Limited assistance     Functional Limitations Info  Sight,Hearing,Speech Sight Info: Adequate Hearing Info: Adequate Speech Info: Adequate    SPECIAL CARE FACTORS FREQUENCY  PT (  By licensed PT),OT (By licensed OT)     PT Frequency: Minimum 5x a week OT Frequency: Minimum 5x a week            Contractures Contractures Info: Not present    Additional Factors Info  Code Status,Allergies Code Status Info: DNR Allergies Info: Cephalexin   Penicillin G   Ciprofloxacin   Penicillins           Current Medications (12/27/2020):  This is the current hospital active  medication list Current Facility-Administered Medications  Medication Dose Route Frequency Provider Last Rate Last Admin  . 0.9 %  sodium chloride infusion   Intravenous PRN Aline August, MD   Stopped at 12/26/20 0112  . acetaminophen (TYLENOL) tablet 650 mg  650 mg Oral Q4H PRN Shalhoub, Sherryll Burger, MD       Or  . acetaminophen (TYLENOL) 160 MG/5ML solution 650 mg  650 mg Per Tube Q4H PRN Shalhoub, Sherryll Burger, MD       Or  . acetaminophen (TYLENOL) suppository 650 mg  650 mg Rectal Q4H PRN Shalhoub, Sherryll Burger, MD      . atorvastatin (LIPITOR) tablet 40 mg  40 mg Oral Daily Rosalin Hawking, MD   40 mg at 12/27/20 5809  . fluticasone (FLONASE) 50 MCG/ACT nasal spray 2 spray  2 spray Each Nare QHS Shalhoub, Sherryll Burger, MD   2 spray at 12/26/20 2152  . hydrALAZINE (APRESOLINE) injection 10 mg  10 mg Intravenous Q6H PRN Shalhoub, Sherryll Burger, MD      . ipratropium-albuterol (DUONEB) 0.5-2.5 (3) MG/3ML nebulizer solution 3 mL  3 mL Nebulization Q4H PRN Shalhoub, Sherryll Burger, MD      . metoprolol tartrate (LOPRESSOR) tablet 25 mg  25 mg Oral BID Vernelle Emerald, MD   25 mg at 12/27/20 0921  . ondansetron (ZOFRAN) injection 4 mg  4 mg Intravenous Q6H PRN Shalhoub, Sherryll Burger, MD      . pantoprazole (PROTONIX) EC tablet 40 mg  40 mg Oral Daily Shalhoub, Sherryll Burger, MD   40 mg at 12/27/20 0649  . polyethylene glycol (MIRALAX / GLYCOLAX) packet 17 g  17 g Oral Daily PRN Alekh, Kshitiz, MD      . senna-docusate (Senokot-S) tablet 1 tablet  1 tablet Oral Daily PRN Starla Link, Kshitiz, MD      . traMADol (ULTRAM) tablet 50 mg  50 mg Oral Q6H PRN Vernelle Emerald, MD   50 mg at 12/25/20 1528  . vitamin B-12 (CYANOCOBALAMIN) tablet 500 mcg  500 mcg Oral Daily Bonnielee Haff, MD   500 mcg at 12/27/20 1251     Discharge Medications: Please see discharge summary for a list of discharge medications.  Relevant Imaging Results:  Relevant Lab Results:   Additional Information SSN [id number]  Ross Ludwig, LCSW

## 2020-12-27 NOTE — Progress Notes (Signed)
Occupational Therapy Treatment Patient Details Name: Yolanda Flowers MRN: [id number] DOB: 06/09/1941 Today's Date: 12/27/2020    History of present illness Pt is an 80 y/o female admitted secondary to multiple falls on 12/20/20. CT showed Acute parafalcine subdural hematoma and MRI showed Multifocal acute ischemia throughout both cerebral and cerebellar  hemispheres, in a pattern most suggestive of watershed infarcts. PMH includes CVA, COPD, and CAD s/p CABG.   OT comments  Pt making steady progress towards OT goals this session. Pt continues to present with decreased activity tolerance, impaired proprioception in R hand and decreased ability to care for self. Pt most concerned with [place] in [place] although pt able to complete seated ADLs at sink including oral care with no depth perception issues or difficulty opening items, did apply built up foam to toothbrush for ease of grasp. Pt stating, " I am not brushing my teeth" during oral care although no evidence of pt missing target during oral care or dropping brush. Did note impaired motor planning when pt attempted to complete opposition with eyes occluded, but otherwise RUE [facility] seems WFL, pt additionally completed below [facility] therex with no issues. Pt very hopeful for CIR, will follow acutely per POC.    Follow Up Recommendations  CIR    Equipment Recommendations  3 in 1 bedside commode    Recommendations for Other Services      Precautions / Restrictions Precautions Precautions: Fall Precaution Comments: Multiple falls Restrictions Weight Bearing Restrictions: No       Mobility Bed Mobility Overal bed mobility: Needs Assistance Bed Mobility: Sit to Supine;Supine to Sit     Supine to sit: Supervision Sit to supine: Supervision   General bed mobility comments: increased time and use of rails but able to get to EOB without assist    Transfers Overall transfer level: Needs assistance Equipment used: Rolling walker (2  wheeled) Transfers: Sit to/from Stand Sit to Stand: Supervision         General transfer comment: Performed slowly but without assist    Balance Overall balance assessment: Needs assistance Sitting-balance support: No upper extremity supported;Feet supported Sitting balance-Leahy Scale: Good     Standing balance support: Bilateral upper extremity supported;No upper extremity supported Standing balance-Leahy Scale: Fair Standing balance comment: B UE for ambulation but could static stand no AD                           ADL either performed or assessed with clinical judgement   ADL Overall ADL's : Needs assistance/impaired     Grooming: Oral care;Sitting;Supervision/safety Grooming Details (indicate cue type and reason): pt able to apply tooth paste to brush and open all needed items, pt over anticipates dropping items but able to complete tasks with no mor than supervision         Upper Body Dressing : Minimal assistance;Sitting Upper Body Dressing Details (indicate cue type and reason): to don posterior gown     Toilet Transfer: Minimal assistance;RW;Ambulation Toilet Transfer Details (indicate cue type and reason): simulated via functional mobility from EOB<>sink         Functional mobility during ADLs: Minimal assistance;Rolling walker General ADL Comments: pt continue to reports weakness and impaired coordination in R hand but pt able to complete ADLs with no issues, did not mild deficits in proprioception     Vision Baseline Vision/History: Wears glasses Wears Glasses: At all times Additional Comments: 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   Perception     Praxis      Cognition Arousal/Alertness: Awake/alert Behavior During Therapy: WFL for tasks assessed/performed;Anxious (anxious about losing function in R hand) Overall Cognitive Status: Within Functional Limits for tasks assessed                                  General Comments: overall WFL but very anxious about her R hand and worried she won't regain function although very mild deficits noted, pt also noted to over anticipate deficits such as stating " I wont be able to hold that" before I hand her a cup        Exercises Hand Exercises Digit Composite Flexion: AROM;Right;5 reps Composite Extension: AROM;Right;5 reps Digit Composite Abduction: AROM;Right;5 reps Digit Composite Adduction: AROM;Right;5 reps Digit Lifts: AROM;Right;5 reps Opposition: AROM;Right   Shoulder Instructions       General Comments issued pt red built up foam for toothbrush    Pertinent Vitals/ Pain       Pain Assessment: Faces Faces Pain Scale: Hurts a little bit Pain Location: chronic back pain Pain Descriptors / Indicators: Aching;Constant;Discomfort Pain Intervention(s): Monitored during session;Repositioned  Home Living                                          Prior Functioning/Environment              Frequency  Min 2X/week        Progress Toward Goals  OT Goals(current goals can now be found in the care plan section)  Progress towards OT goals: Progressing toward goals  Acute Rehab OT Goals Patient Stated Goal: to improve her R hand OT Goal Formulation: With patient Time For Goal Achievement: 01/05/21 Potential to Achieve Goals: Good  Plan Discharge plan remains appropriate;Frequency remains appropriate    Co-evaluation                 AM-PAC OT "6 Clicks" Daily Activity     Outcome Measure   Help from another person eating meals?: None Help from another person taking care of personal grooming?: A Little Help from another person toileting, which includes using toliet, bedpan, or urinal?: A Little Help from another person bathing (including washing, rinsing, drying)?: A Little Help from another person to put on and taking off regular upper body clothing?: A Little Help from another person to put on and  taking off regular lower body clothing?: A Little 6 Click Score: 19    End of Session Equipment Utilized During Treatment: Gait belt;Rolling walker  OT Visit Diagnosis: Unsteadiness on feet (R26.81);Muscle weakness (generalized) (M62.81);Dizziness and giddiness (R42)   Activity Tolerance Patient tolerated treatment well   Patient Left in bed;with call bell/phone within reach;with bed alarm set   Nurse Communication Mobility status        Time: 1610-9604 OT Time Calculation (min): 27 min  Charges: OT General Charges $OT Visit: 1 Visit OT Treatments $Self Care/Home Management : 23-37 mins  Harley Alto., COTA/L Acute Rehabilitation Services [place] 12/27/2020, 1:28 PM

## 2020-12-27 NOTE — Progress Notes (Addendum)
Occupational Therapy Treatment Patient Details Name: Yolanda Flowers MRN: [id number] DOB: 1941-05-30 Today's Date: 12/27/2020    History of present illness Pt is an 80 y/o female admitted secondary to multiple falls on 12/20/20. CT showed Acute parafalcine subdural hematoma and MRI showed Multifocal acute ischemia throughout both cerebral and cerebellar  hemispheres, in a pattern most suggestive of watershed infarcts. PMH includes CVA, COPD, and CAD s/p CABG.   OT comments  Pt seen for second session to initiate RUE HEP for [facility] therex as precursor to higher level ADL tasks, pt completed therex as indicated below with good carryover from previous session and no reports of increased pain, encouraged pt to complete HEP 3x daily. Pts family present during session, with family expressing feelings of frustration from CIR appeal denial as family really wants pt to have the maximum amount of therapy available to pt prior to returning home as pt has history of multiple falls. Explained to family that from acute stand point pt is progressing nicely and is likely to continue to progress, however updated DC recs to include SNF to maximize pts functional independence prior to returning home, if pt is unable to DC to SNF feel pt would definitely benefit from [facility] ( family reiterated that they would not be able to provide 24/7 support and only intermittent supervision for safety and IADLS) will follow acutely per POC and update DC recs as pt progresses.   Follow Up Recommendations  SNF;Home health OT;Supervision/Assistance - 24 hour    Equipment Recommendations  3 in 1 bedside commode    Recommendations for Other Services      Precautions / Restrictions Precautions Precautions: Fall Precaution Comments: Multiple falls Restrictions Weight Bearing Restrictions: No       Mobility Bed Mobility Overal bed mobility: Needs Assistance   General bed mobility comments: session conducted from bed level     Transfers Overall transfer level: Needs assistance         General transfer comment: session focus on RUE HEP    Balance                           ADL either performed or assessed with clinical judgement   ADL Overall ADL's : Needs assistance/impaired   General ADL Comments: session focus on RUE [facility] therex and introduction to HEP, additionally discussed DC plan with pt and family during session     Vision   Perception     Praxis      Cognition Arousal/Alertness: Awake/alert Behavior During Therapy: WFL for tasks assessed/performed Overall Cognitive Status: Within Functional Limits for tasks assessed                                        Exercises Hand Exercises Digit Composite Flexion: AROM;Right;10 reps Composite Extension: AROM;Right;10 reps Digit Composite Abduction: AROM;Right;10 reps Digit Composite Adduction: AROM;Right;10 reps Digit Lifts: AROM;Right;10 reps Thumb Abduction: AROM;Right;10 reps Thumb Adduction: AROM;Right;10 reps Opposition: AROM;Right;5 reps Other Exercises Other Exercises: pulling 10 beads from level 1 theraputty Other Exercises: issued pt level 1 theraputty with written HEP ( therex inclused functional grasp/ release, pincer grasp, and MP flexion/ extension)   Shoulder Instructions       General Comments issued pt red built up foam for toothbrush and utensils    Pertinent Vitals/ Pain       Pain Assessment: No/denies pain  Faces Pain Scale: Hurts a little bit Pain Location: chronic back pain Pain Descriptors / Indicators: Aching;Constant;Discomfort Pain Intervention(s): Monitored during session;Repositioned  Home Living                                          Prior Functioning/Environment              Frequency  Min 2X/week        Progress Toward Goals  OT Goals(current goals can now be found in the care plan section)  Progress towards OT goals: Progressing toward  goals  Acute Rehab OT Goals Patient Stated Goal: to get the most therapy she can OT Goal Formulation: With patient/family Time For Goal Achievement: 01/05/21 Potential to Achieve Goals: Good  Plan Discharge plan remains appropriate;Frequency remains appropriate    Co-evaluation                 AM-PAC OT "6 Clicks" Daily Activity     Outcome Measure   Help from another person eating meals?: None Help from another person taking care of personal grooming?: A Little Help from another person toileting, which includes using toliet, bedpan, or urinal?: A Little Help from another person bathing (including washing, rinsing, drying)?: A Little Help from another person to put on and taking off regular upper body clothing?: A Little Help from another person to put on and taking off regular lower body clothing?: A Little 6 Click Score: 19    End of Session Equipment Utilized During Treatment: Other (comment) (level 1 theraputty)  OT Visit Diagnosis: Unsteadiness on feet (R26.81);Muscle weakness (generalized) (M62.81);Dizziness and giddiness (R42)   Activity Tolerance Patient tolerated treatment well   Patient Left in bed;with call bell/phone within reach;with bed alarm set;with family/visitor present   Nurse Communication Mobility status        Time: 1353-1420 OT Time Calculation (min): 27 min  Charges: OT General Charges $OT Visit: 1 Visit OT Treatments  $Therapeutic Exercise: 23-37 mins Harley Alto., COTA/L Acute Rehabilitation Services [place] 12/27/2020, 4:04 PM

## 2020-12-28 LAB — CBC
HCT: 31.3 % — ABNORMAL LOW (ref 36.0–46.0)
Hemoglobin: 9.9 g/dL — ABNORMAL LOW (ref 12.0–15.0)
MCH: 31 pg (ref 26.0–34.0)
MCHC: 31.6 g/dL (ref 30.0–36.0)
MCV: 98.1 fL (ref 80.0–100.0)
Platelets: 133 10*3/uL — ABNORMAL LOW (ref 150–400)
RBC: 3.19 MIL/uL — ABNORMAL LOW (ref 3.87–5.11)
RDW: 17.3 % — ABNORMAL HIGH (ref 11.5–15.5)
WBC: 27.4 10*3/uL — ABNORMAL HIGH (ref 4.0–10.5)
nRBC: 0.5 % — ABNORMAL HIGH (ref 0.0–0.2)

## 2020-12-28 LAB — BASIC METABOLIC PANEL
Anion gap: 6 (ref 5–15)
BUN: 15 mg/dL (ref 8–23)
CO2: 24 mmol/L (ref 22–32)
Calcium: 8.7 mg/dL — ABNORMAL LOW (ref 8.9–10.3)
Chloride: 107 mmol/L (ref 98–111)
Creatinine, Ser: 1.04 mg/dL — ABNORMAL HIGH (ref 0.44–1.00)
GFR, Estimated: 54 mL/min — ABNORMAL LOW (ref >60)
Glucose, Bld: 117 mg/dL — ABNORMAL HIGH (ref 70–99)
Potassium: 4.2 mmol/L (ref 3.5–5.1)
Sodium: 137 mmol/L (ref 135–145)

## 2020-12-28 MED ORDER — CYANOCOBALAMIN 500 MCG PO TABS: 500.0000 ug | ORAL_TABLET | Freq: Every day | ORAL | Status: DC

## 2020-12-28 MED ORDER — TRAMADOL HCL 50 MG PO TABS: 50.0000 mg | ORAL_TABLET | Freq: Four times a day (QID) | ORAL | 0 refills | Status: DC | PRN

## 2020-12-28 MED ORDER — ATORVASTATIN CALCIUM 40 MG PO TABS: 40.0000 mg | ORAL_TABLET | Freq: Every day | ORAL | Status: DC

## 2020-12-28 MED ORDER — POLYETHYLENE GLYCOL 3350 17 G PO PACK: 17.0000 g | PACK | Freq: Every day | ORAL | 0 refills | Status: DC | PRN

## 2020-12-28 NOTE — Discharge Summary (Signed)
[facility]  Physician Discharge Summary   [name] MRN: [id number] DOB/AGE: 81/09/1940 81 y.o.  Admit date: 12/20/2020 Discharge date: 12/28/2020  PCP: [name], MD  DISCHARGE DIAGNOSES:  Subdural hematoma Acute cardioembolic stroke Community-acquired pneumonia Questionable myelodysplastic syndrome Vitamin B12 deficiency Coronary artery disease  RECOMMENDATIONS FOR OUTPATIENT FOLLOW UP: 1. CBC and basic metabolic panel to be checked in 1 week    Home Health: Plan is for the patient to go to SNF Equipment/Devices: She will need a 3 n 1 if she goes home  CODE STATUS: DNR  DISCHARGE CONDITION: fair  Diet recommendation: Heart healthy  INITIAL HISTORY: 80 year old female with history of essential thrombocytosis currently no longer on hydroxyurea, chronic leukocytosis, chronic subdural hematoma (left-sided since 2008), melanoma, vulvar intraepithelial neoplasia, GERD, CAD status post CABG in 3/21, hypertension, COPD, hyperlipidemia and ischemic stroke (thought to be cardioembolic but source never identified-in 11/2019) presented with withfall. On presentation, CT of the brain showed subdural hematoma measuring 10 mm in thickness. Neurosurgery recommended conservative management and repeat CT of the brain in the morning. She was also found to have evidence of multilobar pneumonia. COVID-19 test was negative. She was started on IV antibiotics. She was found to have scattered embolic lacunar infarcts on MRI of the brain. Neurology was consulted. Repeat CT of the brain was negative for worsening of the subdural bleeding. Neurosurgery signed off. She is not a candidate for any antiplatelets or anticoagulation because of subdural hematoma and neurology recommended outpatient follow-up.   Consultations:  Neurology  Neurosurgery  Procedures:  Transthoracic echocardiogram   HOSPITAL COURSE:   Subdural hematoma She sustained a fall at  home. -CT on presentation showed a small acute parafalcine subdural hematoma. This is superimposed on patient's longstanding history of chronic subdural hematoma since 2008. -Repeat CT of the brain did not show worsening of subdural hematoma.  Neurosurgery recommended conservative management and signed off. Outpatient follow-up with neurosurgery if needed  Acute cardioembolic stroke -MRI of the brain showed scattered embolic lacunar infarcts -Neurology evaluated the patient: She is not a candidate for anticoagulation or antiplatelets because of subdural hematoma. Dose of Lipitor has been decreased to 40 mg daily by neurology. -CTA head and neck showed no significant stenosis -2D echo with bubble showed EF of 65 to 70%, left atrium mildly dilated -CT chest/abdomen and pelvis was ordered by neurology to rule out malignancy: There was no evidence of malignancy on the CT -LDL 25. A1c 5.8 -PT recommending CIR placement. Insurance denied CIR placement.   Patient was seen by speech therapy and cleared for heart healthy diet. CT head was repeated due to concern for new weakness in the right wrist.  CT showed stable findings.  Patient was reassured. Since patient has been denied CIR by her insurance SNF is being pursued.   Patient is medically stable for discharge.  Community-acquired multilobar bacterial pneumonia COVID-19 testing was negative on presentation.   Completed a course of Rocephin and Zithromax.  Currently on room air  Questionable myelodysplastic syndrome Leukocytosis Thrombocytopenia Anemia of chronic disease: Possibly from above and other chronic illnesses -History of thrombocytosis with recent chronic thrombocytopenia and leukocytosis with concern for MDS. Bone marrow biopsy planned as an outpatient soon.  Vitamin B12 deficiency Recently checked vitamin B12 level was 265.    Started on oral supplementation.  Folic acid level was normal.  CAD Chest pain-free.  Continue statin. Cannot continue antiplatelet because of subdural hematoma. Outpatient follow-up with cardiology  GERD Continue PPI  Hyperlipidemia Statin being continued at a lower dose.  Patient is stable.  Okay for discharge today.  Plan is for SNF depending on insurance and bed availability.   PERTINENT LABS:  The results of significant diagnostics from this hospitalization (including imaging, microbiology, ancillary and laboratory) are listed below for reference.    Microbiology: Recent Results (from the past 240 hour(s))  Blood culture (routine x 2)     Status: None   Collection Time: 12/20/20  4:44 PM   Specimen: BLOOD  Result Value Ref Range Status   Specimen Description BLOOD SITE NOT SPECIFIED  Final   Special Requests   Final    BOTTLES DRAWN AEROBIC AND ANAEROBIC Blood Culture results may not be optimal due to an inadequate volume of blood received in culture bottles   Culture   Final    NO GROWTH 5 DAYS Performed at [facility], [place] [address] [place], [place] [address]    Report Status 12/25/2020 FINAL  Final  Blood culture (routine x 2)     Status: None   Collection Time: 12/20/20  4:44 PM   Specimen: BLOOD  Result Value Ref Range Status   Specimen Description BLOOD SITE NOT SPECIFIED  Final   Special Requests   Final    BOTTLES DRAWN AEROBIC AND ANAEROBIC Blood Culture results may not be optimal due to an inadequate volume of blood received in culture bottles   Culture   Final    NO GROWTH 5 DAYS Performed at [facility], [place] [address], [place], [place] [address]    Report Status 12/25/2020 FINAL  Final  Resp Panel by RT-PCR (Flu A&B, Covid) Nasopharyngeal Swab     Status: None   Collection Time: 12/20/20  4:44 PM   Specimen: Nasopharyngeal Swab; Nasopharyngeal(NP) swabs in vial transport medium  Result Value Ref Range Status   SARS Coronavirus 2 by RT PCR NEGATIVE NEGATIVE Final    Comment: (NOTE) SARS-CoV-2 target nucleic acids are  NOT DETECTED.  The SARS-CoV-2 RNA is generally detectable in upper respiratory specimens during the acute phase of infection. The lowest concentration of SARS-CoV-2 viral copies this assay can detect is 138 copies/mL. A negative result does not preclude SARS-Cov-2 infection and should not be used as the sole basis for treatment or other patient management decisions. A negative result may occur with  improper specimen collection/handling, submission of specimen other than nasopharyngeal swab, presence of viral mutation(s) within the areas targeted by this assay, and inadequate number of viral copies(<138 copies/mL). A negative result must be combined with clinical observations, patient history, and epidemiological information. The expected result is Negative.  Fact Sheet for Patients:  EntrepreneurPulse.com.au  Fact Sheet for Healthcare Providers:  IncredibleEmployment.be  This test is no t yet approved or cleared by the [place] FDA and  has been authorized for detection and/or diagnosis of SARS-CoV-2 by FDA under an Emergency Use Authorization (EUA). This EUA will remain  in effect (meaning this test can be used) for the duration of the COVID-19 declaration under Section 564(b)(1) of the Act, 21 U.S.C.section 360bbb-3(b)(1), unless the authorization is terminated  or revoked sooner.       Influenza A by PCR NEGATIVE NEGATIVE Final   Influenza B by PCR NEGATIVE NEGATIVE Final    Comment: (NOTE) The Xpert Xpress SARS-CoV-2/FLU/RSV plus assay is intended as an aid in the diagnosis of influenza from Nasopharyngeal swab specimens and should not be used as a sole basis for treatment. Nasal washings  and aspirates are unacceptable for Xpert Xpress SARS-CoV-2/FLU/RSV testing.  Fact Sheet for Patients: EntrepreneurPulse.com.au  Fact Sheet for Healthcare Providers: IncredibleEmployment.be  This test is not  yet approved or cleared by the [place] FDA and has been authorized for detection and/or diagnosis of SARS-CoV-2 by FDA under an Emergency Use Authorization (EUA). This EUA will remain in effect (meaning this test can be used) for the duration of the COVID-19 declaration under Section 564(b)(1) of the Act, 21 U.S.C. section 360bbb-3(b)(1), unless the authorization is terminated or revoked.  Performed at [facility], [place] [address] [place], [place] [address]   Urine culture     Status: Abnormal   Collection Time: 12/21/20 12:01 AM   Specimen: Urine, Random  Result Value Ref Range Status   Specimen Description URINE, RANDOM  Final   Special Requests NONE  Final   Culture (A)  Final    <10,000 COLONIES/mL INSIGNIFICANT GROWTH Performed at [facility], [place] [address], [place], [place] [address]    Report Status 12/22/2020 FINAL  Final     Labs:  COVID-19 Labs   Lab Results  Component Value Date   [place] NEGATIVE 12/20/2020   [place] NEGATIVE 11/25/2019   [place] NEGATIVE 11/01/2019   [place] NEGATIVE 03/01/2019      Basic Metabolic Panel: Recent Labs  Lab 12/22/20 0951  NA 137  K 4.4  CL 107  CO2 24  GLUCOSE 103*  BUN 11  CREATININE 1.05*  CALCIUM 8.1*  MG 2.3   CBC: Recent Labs  Lab 12/22/20 0951  WBC 23.0*  NEUTROABS 17.3*  HGB 10.0*  HCT 32.4*  MCV 99.7  PLT 144*   BNP: BNP (last 3 results) Recent Labs    12/20/20 1525  BNP 400.9*     IMAGING STUDIES CT Angio Head W or Wo Contrast  Result Date: 12/20/2020 CLINICAL DATA:  Neuro deficit, acute, stroke suspected; history of intracerebral aneurysm, headache for several days, multiple falls today, pain in neck, rule out bleed or aneurysmal change. EXAM: CT ANGIOGRAPHY HEAD AND NECK TECHNIQUE: Multidetector CT imaging of the head and neck was performed using the standard protocol during bolus administration of intravenous contrast. Multiplanar CT image  reconstructions and MIPs were obtained to evaluate the vascular anatomy. Carotid stenosis measurements (when applicable) are obtained utilizing NASCET criteria, using the distal internal carotid diameter as the denominator. CONTRAST:  23mL OMNIPAQUE IOHEXOL 350 MG/ML SOLN COMPARISON:  Brain MRI 11/25/2019. CT angiogram head/neck 11/25/2019. FINDINGS: CT HEAD FINDINGS Brain: Mild cerebral and cerebellar atrophy. Acute parafalcine subdural hematoma measuring up to 10 mm in greatest thickness (series 5, image 11). Unchanged size of a thin chronic subdural hematoma overlying the left cerebral hemisphere, measuring up to 4 mm, deep to the left-sided cranioplasty. Subacute infarct within the left thalamus. Additional known small subacute infarcts within the supratentorial and infratentorial brain were better appreciated on the prior brain MRI of 11/25/2019. 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 Advanced patchy and ill-defined hypoattenuation within the cerebral white matter, nonspecific but compatible with chronic small vessel ischemic disease. No acute demarcated cortical infarct. No evidence of intracranial mass. No midline shift. Vascular: No hyperdense vessel.  Atherosclerotic calcifications Skull: No calvarial fracture.  Left-sided cranioplasty. Sinuses: Scattered trace mucosal thickening and fluid within the bilateral ethmoid air cells. Small right maxillary sinus mucous retention cyst. Orbits: No mass or acute finding. Review of the MIP images confirms the above findings CTA NECK FINDINGS Aortic arch: Standard aortic branching. Atherosclerotic  plaque within the visualized aortic arch and proximal major branch vessels of the neck. No hemodynamically significant innominate or proximal subclavian artery stenosis. Right carotid system: CCA and ICA patent within the neck without significant stenosis (50% or greater). Mild calcified plaque within the carotid bifurcation and proximal ICA. Left  carotid system: CCA and ICA patent within the neck without significant stenosis (50% or greater). Mild soft and calcified plaque within the carotid bifurcation and proximal ICA. Vertebral arteries: Codominant patent within the neck without stenosis. Mild nonstenotic calcified plaque at the origin of the right vertebral artery. Skeleton: No acute bony abnormality or aggressive osseous lesion. Cervical spondylosis. Cervical levocurvature with partially imaged thoracic dextrocurvature. Other neck: Neck mass or cervical lymphadenopathy. Upper chest: Consolidation within portions of the right upper lobe and within imaged portions of the superior right lower lobe compatible with pneumonia. A small ill-defined opacity also present within the left lung apex likely reflecting an additional site of pneumonia. Prior median sternotomy. Review of the MIP images confirms the above findings CTA HEAD FINDINGS Anterior circulation: The intracranial internal carotid arteries are patent. Plaque within both vessels without stenosis. Unchanged 2 mm inferomedially projecting aneurysm arising from the supraclinoid left ICA (series 12, image 122). The M1 middle cerebral arteries are patent. No M2 proximal branch occlusion or high-grade proximal stenosis is identified. The anterior cerebral arteries are patent. Posterior circulation: The intracranial vertebral arteries are patent. The basilar artery is patent. The posterior cerebral arteries are patent. Posterior communicating arteries are hypoplastic or absent bilaterally. Venous sinuses: Within the limitations of contrast timing, no convincing thrombus. Anatomic variants: As described Review of the MIP images confirms the above findings Acute CT head findings called by telephone at the time of interpretation on 12/20/2020 at 7:55 pm to provider [facility] , who verbally acknowledged these results. IMPRESSION: CT head: 1. Acute parafalcine subdural hematoma measuring up to 10 mm in  greatest thickness. 2. Unchanged thin chronic subdural hematoma overlying the left cerebral hemisphere, deep to the left-sided cranioplasty (4 mm in thickness). 3. Subacute infarct within the left thalamus. Additional known small subacute infarcts within the supratentorial and infratentorial brain were better appreciated on the prior brain MRI of 11/25/2019. 4. 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 5. Stable parenchymal atrophy and severe cerebral white matter chronic small vessel ischemic disease. 6. Mild paranasal sinus disease, as described. CTA neck: 1. The common carotid, internal carotid and vertebral arteries are patent within the neck without hemodynamically significant stenosis. Atherosclerotic disease, as described. 2. Findings compatible right upper lobe, right lower lobe and left upper lobe pneumonia. Right lower lobe pneumonia is incompletely imaged. CTA head: 1. No intracranial large vessel occlusion or proximal high-grade arterial stenosis. 2. 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 Electronically Signed   By: Kellie Simmering DO   On: 12/20/2020 19:57   DG Chest 2 View  Result Date: 12/20/2020 CLINICAL DATA:  Dyspnea, fatigue, congestive heart failure EXAM: CHEST - 2 VIEW COMPARISON:  12/22/2019 FINDINGS: There is focal consolidation within the posterior right upper lobe, likely infectious in the acute setting. The chin overlies the lung apices, however, no definite pneumothorax. No pleural effusion. Cardiac size is mildly enlarged. Coronary artery bypass grafting has been performed. The pulmonary vascularity is normal. No acute bone abnormality. IMPRESSION: Right upper lobe consolidation in keeping with acute lobar pneumonia in the appropriate clinical setting. Follow-up chest radiograph is recommended in 3-4 weeks to document resolution  and exclude the presence of a central obstructing lesion. Electronically Signed    By: Fidela Salisbury MD   On: 12/20/2020 16:32   CT HEAD WO CONTRAST  Result Date: 12/26/2020 CLINICAL DATA:  Recent fall with follow-up subdural hematoma. EXAM: CT HEAD WITHOUT CONTRAST TECHNIQUE: Contiguous axial images were obtained from the base of the skull through the vertex without intravenous contrast. COMPARISON:  12/21/2020 FINDINGS: Brain: Previously seen inter hemispheric subdural hematoma is again identified but less prominent when compared with the prior exam. It measures approximately 7 mm in greatest thickness with only minimal mass effect upon the adjacent right cerebral hemisphere. Chronic white matter ischemic changes are seen. No findings to suggest acute hemorrhage, acute infarction or space-occupying mass lesion are noted. Vascular: No hyperdense vessel or unexpected calcification. Skull: Postsurgical changes on the left are again seen and stable. Sinuses/Orbits: No acute finding. Other: None. IMPRESSION: Persistent inter hemispheric subdural hematoma with slight decreased attenuation and slight decrease in thickness. Chronic ischemic changes are again seen. Electronically Signed   By: Inez Catalina M.D.   On: 12/26/2020 16:11   CT HEAD WO CONTRAST  Result Date: 12/21/2020 CLINICAL DATA:  Subdural hemorrhage.  Stroke follow-up EXAM: CT HEAD WITHOUT CONTRAST TECHNIQUE: Contiguous axial images were obtained from the base of the skull through the vertex without intravenous contrast. COMPARISON:  Brain MRI from earlier today FINDINGS: Brain: High-density subdural hematoma along the falx measuring up to 8 mm in thickness, non progressed. No associated significant mass effect. Extensive acute and chronic ischemia as characterized by MRI earlier the same day. No hydrocephalus or shift. Vascular: Negative Skull: Unremarkable remote left craniotomy. Sinuses/Orbits: Bilateral cataract resection IMPRESSION: 1. No interval progression of the inter hemispheric subdural hematoma which measures up to 8 mm  in thickness. 2. Acute and chronic ischemia, reference preceding brain MRI. Electronically Signed   By: Monte Fantasia M.D.   On: 12/21/2020 05:01   CT Angio Neck W and/or Wo Contrast  Result Date: 12/20/2020 CLINICAL DATA:  Neuro deficit, acute, stroke suspected; history of intracerebral aneurysm, headache for several days, multiple falls today, pain in neck, rule out bleed or aneurysmal change. EXAM: CT ANGIOGRAPHY HEAD AND NECK TECHNIQUE: Multidetector CT imaging of the head and neck was performed using the standard protocol during bolus administration of intravenous contrast. Multiplanar CT image reconstructions and MIPs were obtained to evaluate the vascular anatomy. Carotid stenosis measurements (when applicable) are obtained utilizing NASCET criteria, using the distal internal carotid diameter as the denominator. CONTRAST:  90mL OMNIPAQUE IOHEXOL 350 MG/ML SOLN COMPARISON:  Brain MRI 11/25/2019. CT angiogram head/neck 11/25/2019. FINDINGS: CT HEAD FINDINGS Brain: Mild cerebral and cerebellar atrophy. Acute parafalcine subdural hematoma measuring up to 10 mm in greatest thickness (series 5, image 11). Unchanged size of a thin chronic subdural hematoma overlying the left cerebral hemisphere, measuring up to 4 mm, deep to the left-sided cranioplasty. Subacute infarct within the left thalamus. Additional known small subacute infarcts within the supratentorial and infratentorial brain were better appreciated on the prior brain MRI of 11/25/2019. 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 Advanced patchy and ill-defined hypoattenuation within the cerebral white matter, nonspecific but compatible with chronic small vessel ischemic disease. No acute demarcated cortical infarct. No evidence of intracranial mass. No midline shift. Vascular: No hyperdense vessel.  Atherosclerotic calcifications Skull: No calvarial fracture.  Left-sided cranioplasty. Sinuses: Scattered trace mucosal thickening  and fluid within the bilateral ethmoid air cells. Small right maxillary sinus mucous retention cyst. Orbits: No mass  or acute finding. Review of the MIP images confirms the above findings CTA NECK FINDINGS Aortic arch: Standard aortic branching. Atherosclerotic plaque within the visualized aortic arch and proximal major branch vessels of the neck. No hemodynamically significant innominate or proximal subclavian artery stenosis. Right carotid system: CCA and ICA patent within the neck without significant stenosis (50% or greater). Mild calcified plaque within the carotid bifurcation and proximal ICA. Left carotid system: CCA and ICA patent within the neck without significant stenosis (50% or greater). Mild soft and calcified plaque within the carotid bifurcation and proximal ICA. Vertebral arteries: Codominant patent within the neck without stenosis. Mild nonstenotic calcified plaque at the origin of the right vertebral artery. Skeleton: No acute bony abnormality or aggressive osseous lesion. Cervical spondylosis. Cervical levocurvature with partially imaged thoracic dextrocurvature. Other neck: Neck mass or cervical lymphadenopathy. Upper chest: Consolidation within portions of the right upper lobe and within imaged portions of the superior right lower lobe compatible with pneumonia. A small ill-defined opacity also present within the left lung apex likely reflecting an additional site of pneumonia. Prior median sternotomy. Review of the MIP images confirms the above findings CTA HEAD FINDINGS Anterior circulation: The intracranial internal carotid arteries are patent. Plaque within both vessels without stenosis. Unchanged 2 mm inferomedially projecting aneurysm arising from the supraclinoid left ICA (series 12, image 122). The M1 middle cerebral arteries are patent. No M2 proximal branch occlusion or high-grade proximal stenosis is identified. The anterior cerebral arteries are patent. Posterior circulation: The  intracranial vertebral arteries are patent. The basilar artery is patent. The posterior cerebral arteries are patent. Posterior communicating arteries are hypoplastic or absent bilaterally. Venous sinuses: Within the limitations of contrast timing, no convincing thrombus. Anatomic variants: As described Review of the MIP images confirms the above findings Acute CT head findings called by telephone at the time of interpretation on 12/20/2020 at 7:55 pm to provider [facility] , who verbally acknowledged these results. IMPRESSION: CT head: 1. Acute parafalcine subdural hematoma measuring up to 10 mm in greatest thickness. 2. Unchanged thin chronic subdural hematoma overlying the left cerebral hemisphere, deep to the left-sided cranioplasty (4 mm in thickness). 3. Subacute infarct within the left thalamus. Additional known small subacute infarcts within the supratentorial and infratentorial brain were better appreciated on the prior brain MRI of 11/25/2019. 4. 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 5. Stable parenchymal atrophy and severe cerebral white matter chronic small vessel ischemic disease. 6. Mild paranasal sinus disease, as described. CTA neck: 1. The common carotid, internal carotid and vertebral arteries are patent within the neck without hemodynamically significant stenosis. Atherosclerotic disease, as described. 2. Findings compatible right upper lobe, right lower lobe and left upper lobe pneumonia. Right lower lobe pneumonia is incompletely imaged. CTA head: 1. No intracranial large vessel occlusion or proximal high-grade arterial stenosis. 2. 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 Electronically Signed   By: Kellie Simmering DO   On: 12/20/2020 19:57   MR BRAIN WO CONTRAST  Result Date: 12/21/2020 CLINICAL DATA:  Stroke symptoms and parafalcine subdural hematoma EXAM: MRI HEAD WITHOUT CONTRAST TECHNIQUE: Multiplanar,  multiecho pulse sequences of the brain and surrounding structures were obtained without intravenous contrast. COMPARISON:  Head CT 12/20/2020 FINDINGS: Brain: There is multifocal acute ischemia throughout both cerebral and cerebellar hemispheres. The pattern is most suggestive watershed infarcts. The subdural hematoma along the falx cerebri is unchanged. Hyperintense T2-weighted signal is moderately widespread throughout the white  matter. Generalized volume loss without a clear lobar predilection. The midline structures are normal. Vascular: Major flow voids are preserved. Skull and upper cervical spine: Remote left pterional craniotomy. Sinuses/Orbits:No paranasal sinus fluid levels or advanced mucosal thickening. No mastoid or middle ear effusion. Normal orbits. IMPRESSION: 1. Multifocal acute ischemia throughout both cerebral and cerebellar hemispheres, in a pattern most suggestive of watershed infarcts. 2. Unchanged subdural hematoma along the falx cerebri. Electronically Signed   By: Ulyses Jarred M.D.   On: 12/21/2020 00:55   CT CHEST ABDOMEN PELVIS W CONTRAST  Result Date: 12/21/2020 CLINICAL DATA:  Multiple falls yesterday EXAM: CT CHEST, ABDOMEN, AND PELVIS WITH CONTRAST TECHNIQUE: Multidetector CT imaging of the chest, abdomen and pelvis was performed following the standard protocol during bolus administration of intravenous contrast. CONTRAST:  111mL OMNIPAQUE IOHEXOL 300 MG/ML  SOLN COMPARISON:  12/20/2020, 02/22/2020 FINDINGS: CT CHEST FINDINGS Cardiovascular: The heart and great vessels are unremarkable with no pericardial effusion. Dense calcification of the mitral annulus. Extensive atherosclerosis of the native coronary vessels, with evidence of prior CABG. No evidence of thoracic aortic aneurysm or dissection. Moderate atherosclerosis of the aortic arch. Mediastinum/Nodes: No enlarged mediastinal, hilar, or axillary lymph nodes. Thyroid gland, trachea, and esophagus demonstrate no significant  findings. Lungs/Pleura: There is a 6 mm right upper lobe pulmonary nodule reference image 29/6, and a 5 mm right middle lobe pulmonary nodule reference image 39/6. These are unchanged since 2020 and can be considered benign. There is dense consolidation within the right upper lobe and superior segment right lower lobe with air bronchograms, compatible with pneumonia. Nodular area of consolidation within the superior segment left lower lobe image 30/6 measures 9 mm, and may reflect an additional focus of infection though follow-up CT will be needed to assess resolution. No effusion or pneumothorax.  The central airways are patent. Musculoskeletal: There are no acute or destructive bony lesions. Reconstructed images demonstrate no additional findings. CT ABDOMEN PELVIS FINDINGS Hepatobiliary: High attenuation material within the gallbladder likely reflects vicarious excretion of previously administered contrast. No evidence of cholelithiasis or cholecystitis. Liver is unremarkable. Pancreas: Unremarkable. No pancreatic ductal dilatation or surrounding inflammatory changes. Spleen: Borderline splenomegaly measuring 12.1 cm in anterior-posterior dimension, stable. No focal abnormalities. Adrenals/Urinary Tract: Stable hypodensities right kidney compatible with small cysts. No urinary tract calculi or obstructive uropathy. Excreted contrast is seen within the urinary bladder, with no filling defects. The adrenals are unremarkable. Stomach/Bowel: No bowel obstruction or ileus. Diffuse diverticulosis of the distal colon without diverticulitis. No bowel wall thickening or inflammatory change. Vascular/Lymphatic: Aortic atherosclerosis. No enlarged abdominal or pelvic lymph nodes. Reproductive: Status post hysterectomy. No adnexal masses. Other: No free fluid or free gas.  No abdominal wall hernia. Musculoskeletal: No acute or destructive bony lesions. Reconstructed images demonstrate no additional findings. IMPRESSION: 1.  No acute intrathoracic, intra-abdominal, or intrapelvic trauma. 2. Multifocal pneumonia, most pronounced in the right upper and right lower lobes. 3. Nodular consolidation within the superior segment left lower lobe measuring 9 mm, favor infection. CT follow-up will be needed after appropriate medical management to document resolution. 4. Benign subcentimeter nodules within the right upper and right middle lobes. 5. Borderline splenomegaly. 6.  Aortic Atherosclerosis (ICD10-I70.0). Electronically Signed   By: Randa Ngo M.D.   On: 12/21/2020 15:34   DG Knee Complete 4 Views Left  Result Date: 12/20/2020 CLINICAL DATA:  Pain following fall EXAM: LEFT KNEE - COMPLETE 4+ VIEW COMPARISON:  None. FINDINGS: Frontal, lateral, and bilateral oblique views were obtained. There is  no fracture or dislocation. No joint effusion. There is moderate narrowing medially and in the patellofemoral joint region. There is spurring in all compartments. No erosive changes. There are foci of arterial vascular calcification. 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 No fracture, dislocation, or effusion. Atherosclerotic arterial vascular calcifications noted. Electronically Signed   By: Lowella Grip III M.D.   On: 12/20/2020 16:28   DG Knee Complete 4 Views Right  Result Date: 12/20/2020 CLINICAL DATA:  Pain following fall EXAM: RIGHT KNEE - COMPLETE 4+ VIEW COMPARISON:  None. FINDINGS: Frontal, lateral, and bilateral oblique views were obtained. No appreciable fracture or dislocation. No joint effusion. There is moderate narrowing of the patellofemoral joint. There is mild narrowing medially. There is spurring in all compartments. No erosive change. There are multiple foci of arterial vascular calcification. Surgical clips are noted medial to the proximal tibia. IMPRESSION: Osteoarthritic change early, primarily in the patellofemoral joint and medial compartments. No fracture,  dislocation, or effusion. Foci of atherosclerotic arterial vascular calcification noted. Electronically Signed   By: Lowella Grip III M.D.   On: 12/20/2020 16:29   CUP PACEART REMOTE DEVICE CHECK  Result Date: 12/12/2020 ILR summary report received. Battery status OK. Normal device function. No new symptom, tachy, brady, or pause episodes. No new AF episodes. Monthly summary reports and ROV/PRN  ECHOCARDIOGRAM COMPLETE BUBBLE STUDY  Result Date: 12/21/2020    ECHOCARDIOGRAM REPORT   Patient Name:   JONELLE BANN Date of Exam: 12/21/2020 Medical Rec #:  [id number]      Height:       66.0 in Accession #:    [id number]     Weight:       170.0 lb Date of Birth:  1941/04/08     BSA:          1.866 m Patient Age:    9 years       BP:           125/73 mmHg Patient Gender: F              HR:           71 bpm. Exam Location:  Inpatient Procedure: 2D Echo, Cardiac Doppler, Color Doppler and Saline Contrast Bubble            Study Indications:    CVA  History:        Patient has prior history of Echocardiogram examinations, most                 recent 11/25/2019. CAD, Stroke and COPD, Signs/Symptoms:Shortness                 of Breath; Risk Factors:Hypertension and Dyslipidemia.  Sonographer:    Snyder Referring Phys: [id number] Nix[place]  1. Agitated saline contrast bubble study was negative, with no evidence of any interatrial shunt.  2. Left ventricular ejection fraction, by estimation, is 65 to 70%. The left ventricle has normal function. The left ventricle has no regional wall motion abnormalities. Left ventricular diastolic parameters are consistent with Grade I diastolic dysfunction (impaired relaxation).  3. Right ventricular systolic function is normal. The right ventricular size is normal. There is normal pulmonary artery systolic pressure. The estimated right ventricular systolic pressure is 25.0 mmHg.  4. Left atrial size was mildly dilated.  5. The mitral valve is abnormal.  Trivial mitral valve regurgitation. The mean mitral valve gradient is 3.0 mmHg with average heart rate of  72 bpm. Moderate mitral annular calcification. Calcified papillary muscle noted, not as well seen in prior study.  6. The aortic valve is tricuspid. Aortic valve regurgitation is not visualized. Aortic valve sclerosis is present, with no evidence of aortic valve stenosis. Comparison(s): A prior study was performed on 11/25/19. No significant change from prior study. Prior images reviewed side by side. FINDINGS  Left Ventricle: Left ventricular ejection fraction, by estimation, is 65 to 70%. The left ventricle has normal function. The left ventricle has no regional wall motion abnormalities. The left ventricular internal cavity size was normal in size. There is  no left ventricular hypertrophy of the basal-septal segment. Left ventricular diastolic parameters are consistent with Grade I diastolic dysfunction (impaired relaxation). Right Ventricle: The right ventricular size is normal. No increase in right ventricular wall thickness. Right ventricular systolic function is normal. There is normal pulmonary artery systolic pressure. The tricuspid regurgitant velocity is 2.84 m/s, and  with an assumed right atrial pressure of 3 mmHg, the estimated right ventricular systolic pressure is 97.0 mmHg. Left Atrium: Left atrial size was mildly dilated. Right Atrium: Right atrial size was normal in size. Pericardium: Trivial pericardial effusion is present. Mitral Valve: Calcified papillary muscle noted. The mitral valve is abnormal. There is moderate calcification of the mitral valve leaflet(s). Moderate mitral annular calcification. Trivial mitral valve regurgitation. MV peak gradient, 7.8 mmHg. The mean mitral valve gradient is 3.0 mmHg with average heart rate of 72 bpm. Tricuspid Valve: The tricuspid valve is grossly normal. Tricuspid valve regurgitation is mild. Aortic Valve: The aortic valve is tricuspid. Aortic valve  regurgitation is not visualized. Mild aortic valve sclerosis is present, with no evidence of aortic valve stenosis. Aortic valve mean gradient measures 7.0 mmHg. Aortic valve peak gradient measures 14.2 mmHg. Aortic valve area, by VTI measures 2.04 cm. Pulmonic Valve: The pulmonic valve was grossly normal. Pulmonic valve regurgitation is not visualized. No evidence of pulmonic stenosis. Aorta: The aortic root and ascending aorta are structurally normal, with no evidence of dilitation. IAS/Shunts: The atrial septum is grossly normal. Agitated saline contrast was given intravenously to evaluate for intracardiac shunting. Agitated saline contrast bubble study was negative, with no evidence of any interatrial shunt.  LEFT VENTRICLE PLAX 2D LVIDd:         5.00 cm     Diastology LVIDs:         1.70 cm     LV e' medial:    4.79 cm/s LV PW:         0.80 cm     LV E/e' medial:  15.3 LV IVS:        0.90 cm     LV e' lateral:   7.94 cm/s LVOT diam:     1.80 cm     LV E/e' lateral: 9.2 LV SV:         72 LV SV Index:   38 LVOT Area:     2.54 cm  LV Volumes (MOD) LV vol d, MOD A2C: 29.3 ml LV vol d, MOD A4C: 36.6 ml LV vol s, MOD A2C: 8.6 ml LV vol s, MOD A4C: 15.8 ml LV SV MOD A2C:     20.7 ml LV SV MOD A4C:     36.6 ml LV SV MOD BP:      20.5 ml RIGHT VENTRICLE RV S prime:     16.00 cm/s  PULMONARY VEINS TAPSE (M-mode): 1.8 cm      A Reversal Duration: 116.00 msec  A Reversal Velocity: 33.20 cm/s                             Diastolic Velocity:  39.10 cm/s                             S/D Velocity:        1.50                             Systolic Velocity:   59.40 cm/s LEFT ATRIUM             Index       RIGHT ATRIUM           Index LA diam:        3.70 cm 1.98 cm/m  RA Area:     16.10 cm LA Vol (A2C):   77.2 ml 41.36 ml/m RA Volume:   38.80 ml  20.79 ml/m LA Vol (A4C):   48.1 ml 25.77 ml/m LA Biplane Vol: 65.3 ml 34.99 ml/m  AORTIC VALVE                    PULMONIC VALVE AV Area (Vmax):    2.24  cm     PV Vmax:       0.88 m/s AV Area (Vmean):   2.19 cm     PV Vmean:      64.500 cm/s AV Area (VTI):     2.04 cm     PV VTI:        0.179 m AV Vmax:           188.50 cm/s  PV Peak grad:  3.1 mmHg AV Vmean:          119.500 cm/s PV Mean grad:  2.0 mmHg AV VTI:            0.351 m AV Peak Grad:      14.2 mmHg AV Mean Grad:      7.0 mmHg LVOT Vmax:         166.00 cm/s LVOT Vmean:        103.000 cm/s LVOT VTI:          0.281 m LVOT/AV VTI ratio: 0.80  AORTA Ao Root diam: 3.50 cm Ao Asc diam:  3.20 cm MITRAL VALVE                TRICUSPID VALVE MV Area (PHT): 1.62 cm     TR Peak grad:   32.3 mmHg MV Area VTI:   2.03 cm     TR Vmax:        284.00 cm/s MV Peak grad:  7.8 mmHg MV Mean grad:  3.0 mmHg     SHUNTS MV Vmax:       1.40 m/s     Systemic VTI:  0.28 m MV Vmean:      73.7 cm/s    Systemic Diam: 1.80 cm MV Decel Time: 468 msec MR Peak grad: 43.8 mmHg MR Vmax:      331.00 cm/s MV E velocity: 73.30 cm/s MV A velocity: 127.00 cm/s MV E/A ratio:  0.58 Riley Lam MD Electronically signed by Riley Lam MD Signature Date/Time: 12/21/2020/11:24:53 AM    Final    VAS [place] LOWER EXTREMITY VENOUS (DVT)  Result Date: 12/22/2020  Lower Venous DVT Study Patient Name:  [name]  Date of Exam:   12/21/2020 Medical Rec #: [id number]       Accession #:    [id number] Date of Birth: 12/12/1940      Patient Gender: F Patient Age:   79Y Exam Location:  [facility] Procedure:      VAS [place] LOWER EXTREMITY VENOUS (DVT) Referring Phys: [id number] Rosalin Hawking --------------------------------------------------------------------------------  Indications: Stroke.  Risk Factors: None identified. Comparison Study: No prior studies. Performing Technologist: Oliver Hum RVT  Examination Guidelines: A complete evaluation includes B-mode imaging, spectral Doppler, color Doppler, and power Doppler as needed of all accessible portions of each vessel. Bilateral testing is considered an integral part of a complete  examination. Limited examinations for reoccurring indications may be performed as noted. The reflux portion of the exam is performed with the patient in reverse Trendelenburg.  +---------+---------------+---------+-----------+----------+--------------+ RIGHT    CompressibilityPhasicitySpontaneityPropertiesThrombus Aging +---------+---------------+---------+-----------+----------+--------------+ CFV      Full           Yes      Yes                                 +---------+---------------+---------+-----------+----------+--------------+ SFJ      Full                                                        +---------+---------------+---------+-----------+----------+--------------+ FV Prox  Full                                                        +---------+---------------+---------+-----------+----------+--------------+ FV Mid   Full                                                        +---------+---------------+---------+-----------+----------+--------------+ FV DistalFull                                                        +---------+---------------+---------+-----------+----------+--------------+ PFV      Full                                                        +---------+---------------+---------+-----------+----------+--------------+ POP      Full           Yes      Yes                                 +---------+---------------+---------+-----------+----------+--------------+ PTV      Full                                                        +---------+---------------+---------+-----------+----------+--------------+  PERO     Full                                                        +---------+---------------+---------+-----------+----------+--------------+   +---------+---------------+---------+-----------+----------+--------------+ LEFT     CompressibilityPhasicitySpontaneityPropertiesThrombus Aging  +---------+---------------+---------+-----------+----------+--------------+ CFV      Full           Yes      Yes                                 +---------+---------------+---------+-----------+----------+--------------+ SFJ      Full                                                        +---------+---------------+---------+-----------+----------+--------------+ FV Prox  Full                                                        +---------+---------------+---------+-----------+----------+--------------+ FV Mid   Full                                                        +---------+---------------+---------+-----------+----------+--------------+ FV DistalFull                                                        +---------+---------------+---------+-----------+----------+--------------+ PFV      Full                                                        +---------+---------------+---------+-----------+----------+--------------+ POP      Full           Yes      Yes                                 +---------+---------------+---------+-----------+----------+--------------+ PTV      Full                                                        +---------+---------------+---------+-----------+----------+--------------+ PERO     Full                                                        +---------+---------------+---------+-----------+----------+--------------+  Summary: RIGHT: - There is no evidence of deep vein thrombosis in the lower extremity.  - No cystic structure found in the popliteal fossa.  LEFT: - There is no evidence of deep vein thrombosis in the lower extremity.  - No cystic structure found in the popliteal fossa.  *See table(s) above for measurements and observations. Electronically signed by Ruta Hinds MD on 12/22/2020 at 5:35:21 PM.    Final     DISCHARGE EXAMINATION: Vitals:   12/27/20 2340 12/28/20 0354 12/28/20 0711 12/28/20  0957  BP: 127/76 134/84 (!) 142/69 (!) 144/72  Pulse: 68 67 68 65  Resp: $Remo'18 18 20 18  'ggGRb$ Temp: (!) 97.5 F (36.4 C) 98.5 F (36.9 C) 98.5 F (36.9 C)   TempSrc: Oral Oral Oral   SpO2: 94% 93% 93% 94%  Weight:      Height:       General appearance: Awake alert.  In no distress Resp: Clear to auscultation bilaterally.  Normal effort Cardio: S1-S2 is normal regular.  No S3-S4.  No rubs murmurs or bruit GI: Abdomen is soft.  Nontender nondistended.  Bowel sounds are present normal.  No masses organomegaly    DISPOSITION: SNF versus home with home health  Discharge Instructions    Ambulatory referral to Neurology   Complete by: As directed    Follow up with Dr. Leonie Man at [facility] in 4-6 weeks. Too complicated for RN to follow. Thanks.   Call MD for:  difficulty breathing, headache or visual disturbances   Complete by: As directed    Call MD for:  extreme fatigue   Complete by: As directed    Call MD for:  persistant dizziness or light-headedness   Complete by: As directed    Call MD for:  persistant nausea and vomiting   Complete by: As directed    Call MD for:  severe uncontrolled pain   Complete by: As directed    Call MD for:  temperature >100.4   Complete by: As directed    Diet - low sodium heart healthy   Complete by: As directed    Discharge instructions   Complete by: As directed    Please review instructions on the discharge summary.  You were cared for by a hospitalist during your hospital stay. If you have any questions about your discharge medications or the care you received while you were in the hospital after you are discharged, you can call the unit and asked to speak with the hospitalist on call if the hospitalist that took care of you is not available. Once you are discharged, your primary care physician will handle any further medical issues. Please note that NO REFILLS for any discharge medications will be authorized once you are discharged, as it is imperative that  you return to your primary care physician (or establish a relationship with a primary care physician if you do not have one) for your aftercare needs so that they can reassess your need for medications and monitor your lab values. If you do not have a primary care physician, you can call [phone] for a physician referral.   Increase activity slowly   Complete by: As directed         Allergies as of 12/28/2020      Reactions   Cephalexin Hives   Penicillin G    Ciprofloxacin Other (See Comments)   Gi intolerance   Penicillins Rash   Childhood reaction Did it involve swelling of the face/tongue/throat, SOB, or low  BP? No Did it involve sudden or severe rash/hives, skin peeling, or any reaction on the inside of your mouth or nose? No Did you need to seek medical attention at a hospital or doctor's office? No When did it last happen?50 years ago If all above answers are "NO", may proceed with cephalosporin use.      Medication List    STOP taking these medications   clopidogrel 75 MG tablet Commonly known as: PLAVIX     TAKE these medications   acetaminophen 325 MG tablet Commonly known as: TYLENOL Take 1-2 tablets (325-650 mg total) by mouth every 4 (four) hours as needed for mild pain.   ammonium lactate 12 % lotion Commonly known as: LAC-HYDRIN Apply 1 application topically as needed for dry skin.   atorvastatin 40 MG tablet Commonly known as: LIPITOR Take 1 tablet (40 mg total) by mouth daily. What changed:   medication strength  how much to take  additional instructions   CALCIUM 600 + D PO Take 1 tablet by mouth in the morning and at bedtime.   fluticasone 50 MCG/ACT nasal spray Commonly known as: FLONASE Place 2 sprays into both nostrils at bedtime.   Magnesium 250 MG Tabs Take 250 mg by mouth at bedtime.   metoprolol tartrate 25 MG tablet Commonly known as: LOPRESSOR Take 1 tablet (25 mg total) by mouth 2 (two) times daily.   pantoprazole 40 MG  tablet Commonly known as: PROTONIX Take 1 tablet (40 mg total) by mouth daily.   polyethylene glycol 17 g packet Commonly known as: MIRALAX / GLYCOLAX Take 17 g by mouth daily as needed for moderate constipation.   PreserVision AREDS 2 Caps Take 1 capsule by mouth in the morning and at bedtime.   traMADol 50 MG tablet Commonly known as: ULTRAM Take 1 tablet (50 mg total) by mouth every 6 (six) hours as needed for moderate pain.   vitamin B-12 500 MCG tablet Commonly known as: CYANOCOBALAMIN Take 1 tablet (500 mcg total) by mouth daily.   vitamin C 1000 MG tablet Take 1,000 mg by mouth daily.   Vitamin D (Ergocalciferol) 1.25 MG (50000 UNIT) Caps capsule Commonly known as: DRISDOL Take 50,000 Units by mouth every 14 (fourteen) days.         Follow-up Information    [name], MD. Schedule an appointment as soon as possible for a visit in 1 week(s).   Specialty: Internal Medicine Contact information: [place] [address] [phone]        Eustace Moore, MD. Schedule an appointment as soon as possible for a visit in 1 week(s).   Specialty: Neurosurgery Contact information: [address] [place] [address] [place] [address] [phone]        Garvin Fila, MD. Schedule an appointment as soon as possible for a visit in 4 week(s).   Specialties: Neurology, Radiology Contact information: [address] [place]  [address] [phone]               TOTAL DISCHARGE TIME: 35 minutes  [place]  [facility] Pager on [email or web address]  12/28/2020, 10:06 AM

## 2020-12-28 NOTE — TOC Progression Note (Signed)
Transition of Care [facility]) - Progression Note    Patient Details  Name: Yolanda Flowers MRN: [id number] Date of Birth: 03/07/1941  Transition of Care [facility]) CM/SW [place], Seth Ward Phone Number: 12/28/2020, 2:35 PM  Clinical Narrative:   CSW spoke with patient, patient's daughter-in-law, and patient's son throughout the day on SNF placement. Patient deferred to her daughter-in-law for discussion on SNF placement. CSW asked about patient's vaccination status, and patient is not vaccinated and does not want to due to concern about blood clots. CSW discussed with daughter-in-law and son about need to quarantine at SNF due to her unvaccinated status, and they indicated understanding. [place] has a quarantine bed available and can accept the patient. CSW submitted insurance authorization request through to [facility] : [facility], they will review and call CSW back with decision. CSW explained to patient and family that insurance may end up denying SNF, as well, as the patient is doing very well, but they disagree and want to continue to pursue SNF placement. CSW to follow.    Expected Discharge Plan: Skilled Nursing Facility Barriers to Discharge: Insurance Authorization  Expected Discharge Plan and Services Expected Discharge Plan: [place] In-house Referral: Clinical Social Work   Post Acute Care Choice: [name] Living arrangements for the past 2 months: Single Family Home                 DME Arranged: N/A DME Agency: NA       HH Arranged: NA HH Agency: NA         Social Determinants of Health (SDOH) Interventions    Readmission Risk Interventions No flowsheet data found.

## 2020-12-28 NOTE — Progress Notes (Signed)
Physical Therapy Treatment Patient Details Name: Yolanda Flowers MRN: [id number] DOB: 11-19-1940 Today's Date: 12/28/2020    History of Present Illness Pt is an 80 y/o female admitted secondary to multiple falls on 12/20/20. CT showed Acute parafalcine subdural hematoma and MRI showed Multifocal acute ischemia throughout both cerebral and cerebellar  hemispheres, in a pattern most suggestive of watershed infarcts. PMH includes CVA, COPD, and CAD s/p CABG.    PT Comments    Pt with increased c/o fatigue today that limited mobility.  She ambulated 125' with RW but with multiple standing rest breaks.  She did not require physical assist but did provide min guard for safety.  Checked and all VSS in sitting and standing.  Continue to progress as able. Noted family wanting CIR or SNF at d/c for pt to get as much therapy as possible and decrease fall risk - pt with hx of falls prior to admission.  Pt would need supervision at home due to easily fatigued and inconsistent endurance levels - if supervision not available may benefit from SNF>   Also, encouraged pt to be up as much as possible with staff during a day to simulate a normal day at home - example: up for meals , up to bathroom (not purewick), etc.    Follow Up Recommendations  Home health PT;Supervision for mobility/OOB (SNF if supervision not available)     Equipment Recommendations  3in1 (PT)    Recommendations for Other Services       Precautions / Restrictions Precautions Precautions: Fall Precaution Comments: hx of falls    Mobility  Bed Mobility Overal bed mobility: Needs Assistance Bed Mobility: Supine to Sit     Supine to sit: Supervision     General bed mobility comments: bed flat and no rails; increased time but no assist    Transfers Overall transfer level: Needs assistance Equipment used: Rolling walker (2 wheeled) Transfers: Sit to/from Stand Sit to Stand: Supervision         General transfer comment:  performed x 3 during session  Ambulation/Gait Ambulation/Gait assistance: Min guard Gait Distance (Feet): 125 Feet (125 then 10) Assistive device: Rolling walker (2 wheeled) Gait Pattern/deviations: Step-to pattern;Decreased stride length;Trunk flexed Gait velocity: decreased   General Gait Details: Good RW proximity without cues; min cues for posture.  Pt reports fatigued and feeling wobbly today so provided min guard.  She took 2 standing rest breaks but had no LOB   Marine scientist Rankin (Stroke Patients Only) Modified Rankin (Stroke Patients Only) Pre-Morbid Rankin Score: No significant disability Modified Rankin: Moderate disability     Balance Overall balance assessment: Needs assistance Sitting-balance support: No upper extremity supported;Feet supported Sitting balance-Leahy Scale: Good Sitting balance - Comments: Sat EOB without support.  Worked on sitting balance activities including reaching outside BOS in all directions.  Also, performed reaction recovery strategies by having pt lean onto therapist hand and then support removed.  Pt recovering L and posterior without support.  To R needing to use UE support to recover - also pt leaning more into R before support removed.  Performed x 6 to R   Standing balance support: Bilateral upper extremity supported;No upper extremity supported Standing balance-Leahy Scale: Fair Standing balance comment: B UE for ambulation but could static stand no AD  Cognition Arousal/Alertness: Awake/alert Behavior During Therapy: Flat affect;Anxious Overall Cognitive Status: Within Functional Limits for tasks assessed                                 General Comments: Reports fatigued and not feeling well, flat, worried about function and not getting better , stated "I'm tired of getting worse"; Tried to encourage and discuss that mobility is  overall improving      Exercises      General Comments General comments (skin integrity, edema, etc.): Pt reports feeling worse today and fatigued easily. Pt having c/o severe fatigue with ambulation.  HR was 80 bpm, O2 sats 97%.  Once back to room BP in sitting 137/70 and standing 127/73.  Tried to encourage pt that overall she is improving.  Acknowledged that she does have some weakness, strokes can cause fatigue, also medications sometimes contribute to fatigue , but also encouraged more activity and being OOB more to improve strength and fatigue (trying to simulate more of a normal day at home, up to bathroom, sitting up for meals, etc). She reports daughter is handling d/c plans and she will do whatever is decided.      Pertinent Vitals/Pain Pain Assessment: No/denies pain    Home Living                      Prior Function            PT Goals (current goals can now be found in the care plan section) Acute Rehab PT Goals Patient Stated Goal: to get the most therapy she can PT Goal Formulation: With patient Time For Goal Achievement: 01/10/21 Potential to Achieve Goals: Good Progress towards PT goals: Progressing toward goals    Frequency    Min 4X/week      PT Plan Current plan remains appropriate    Co-evaluation              AM-PAC PT "6 Clicks" Mobility   Outcome Measure  Help needed turning from your back to your side while in a flat bed without using bedrails?: None Help needed moving from lying on your back to sitting on the side of a flat bed without using bedrails?: None Help needed moving to and from a bed to a chair (including a wheelchair)?: A Little Help needed standing up from a chair using your arms (e.g., wheelchair or bedside chair)?: A Little Help needed to walk in hospital room?: A Little Help needed climbing 3-5 steps with a railing? : A Little 6 Click Score: 20    End of Session Equipment Utilized During Treatment: Gait  belt Activity Tolerance: Patient limited by fatigue Patient left: with call bell/phone within reach;with chair alarm set Nurse Communication: Mobility status PT Visit Diagnosis: Unsteadiness on feet (R26.81);Muscle weakness (generalized) (M62.81)     Time: 0160-1093 PT Time Calculation (min) (ACUTE ONLY): 30 min  Charges:  $Gait Training: 8-22 mins $Therapeutic Activity: 8-22 mins                     Abran Richard, PT Acute Rehab Services Pager [phone] [facility] [place] 12/28/2020, 12:29 PM

## 2020-12-29 DIAGNOSIS — I639 Cerebral infarction, unspecified: Secondary | ICD-10-CM | POA: Diagnosis not present

## 2020-12-29 DIAGNOSIS — Z951 Presence of aortocoronary bypass graft: Secondary | ICD-10-CM | POA: Diagnosis not present

## 2020-12-29 DIAGNOSIS — Z79899 Other long term (current) drug therapy: Secondary | ICD-10-CM | POA: Diagnosis not present

## 2020-12-29 DIAGNOSIS — I2510 Atherosclerotic heart disease of native coronary artery without angina pectoris: Secondary | ICD-10-CM | POA: Diagnosis not present

## 2020-12-29 DIAGNOSIS — K219 Gastro-esophageal reflux disease without esophagitis: Secondary | ICD-10-CM | POA: Diagnosis not present

## 2020-12-29 DIAGNOSIS — W19XXXD Unspecified fall, subsequent encounter: Secondary | ICD-10-CM | POA: Diagnosis not present

## 2020-12-29 DIAGNOSIS — J309 Allergic rhinitis, unspecified: Secondary | ICD-10-CM | POA: Diagnosis not present

## 2020-12-29 DIAGNOSIS — S065X9A Traumatic subdural hemorrhage with loss of consciousness of unspecified duration, initial encounter: Secondary | ICD-10-CM | POA: Diagnosis not present

## 2020-12-29 DIAGNOSIS — G4733 Obstructive sleep apnea (adult) (pediatric): Secondary | ICD-10-CM | POA: Diagnosis not present

## 2020-12-29 DIAGNOSIS — E559 Vitamin D deficiency, unspecified: Secondary | ICD-10-CM | POA: Diagnosis not present

## 2020-12-29 DIAGNOSIS — J449 Chronic obstructive pulmonary disease, unspecified: Secondary | ICD-10-CM | POA: Diagnosis not present

## 2020-12-29 DIAGNOSIS — I509 Heart failure, unspecified: Secondary | ICD-10-CM | POA: Diagnosis not present

## 2020-12-29 DIAGNOSIS — S065X9D Traumatic subdural hemorrhage with loss of consciousness of unspecified duration, subsequent encounter: Secondary | ICD-10-CM | POA: Diagnosis not present

## 2020-12-29 DIAGNOSIS — L853 Xerosis cutis: Secondary | ICD-10-CM | POA: Diagnosis not present

## 2020-12-29 DIAGNOSIS — J159 Unspecified bacterial pneumonia: Secondary | ICD-10-CM | POA: Diagnosis not present

## 2020-12-29 DIAGNOSIS — K5900 Constipation, unspecified: Secondary | ICD-10-CM | POA: Diagnosis not present

## 2020-12-29 DIAGNOSIS — F419 Anxiety disorder, unspecified: Secondary | ICD-10-CM | POA: Diagnosis not present

## 2020-12-29 DIAGNOSIS — D469 Myelodysplastic syndrome, unspecified: Secondary | ICD-10-CM | POA: Diagnosis not present

## 2020-12-29 DIAGNOSIS — E538 Deficiency of other specified B group vitamins: Secondary | ICD-10-CM | POA: Diagnosis not present

## 2020-12-29 DIAGNOSIS — I10 Essential (primary) hypertension: Secondary | ICD-10-CM | POA: Diagnosis not present

## 2020-12-29 DIAGNOSIS — R52 Pain, unspecified: Secondary | ICD-10-CM | POA: Diagnosis not present

## 2020-12-29 DIAGNOSIS — J189 Pneumonia, unspecified organism: Secondary | ICD-10-CM | POA: Diagnosis not present

## 2020-12-29 LAB — RESP PANEL BY RT-PCR (FLU A&B, COVID) ARPGX2
Influenza A by PCR: NEGATIVE
Influenza B by PCR: NEGATIVE
SARS Coronavirus 2 by RT PCR: NEGATIVE

## 2020-12-29 NOTE — Discharge Summary (Signed)
[facility]  Physician Discharge Summary   [name] MRN: [id number] DOB/AGE: Apr 09, 1941 80 y.o.  Admit date: 12/20/2020 Discharge date: 12/29/2020  PCP: [name], MD  DISCHARGE DIAGNOSES:  Subdural hematoma Acute cardioembolic stroke Community-acquired pneumonia Questionable myelodysplastic syndrome Vitamin B12 deficiency Coronary artery disease  RECOMMENDATIONS FOR OUTPATIENT FOLLOW UP: 1. CBC and basic metabolic panel to be checked in 1 week    Home Health: Plan is for the patient to go to SNF Equipment/Devices: She will need a 3 n 1 if she goes home  CODE STATUS: DNR  DISCHARGE CONDITION: fair  Diet recommendation: Heart healthy  INITIAL HISTORY: 80 year old female with history of essential thrombocytosis currently no longer on hydroxyurea, chronic leukocytosis, chronic subdural hematoma (left-sided since 2008), melanoma, vulvar intraepithelial neoplasia, GERD, CAD status post CABG in 3/21, hypertension, COPD, hyperlipidemia and ischemic stroke (thought to be cardioembolic but source never identified-in 11/2019) presented with withfall. On presentation, CT of the brain showed subdural hematoma measuring 10 mm in thickness. Neurosurgery recommended conservative management and repeat CT of the brain in the morning. She was also found to have evidence of multilobar pneumonia. COVID-19 test was negative. She was started on IV antibiotics. She was found to have scattered embolic lacunar infarcts on MRI of the brain. Neurology was consulted. Repeat CT of the brain was negative for worsening of the subdural bleeding. Neurosurgery signed off. She is not a candidate for any antiplatelets or anticoagulation because of subdural hematoma and neurology recommended outpatient follow-up.   Consultations:  Neurology  Neurosurgery  Procedures:  Transthoracic echocardiogram   HOSPITAL COURSE:   Subdural hematoma She sustained a fall at  home. -CT on presentation showed a small acute parafalcine subdural hematoma. This is superimposed on patient's longstanding history of chronic subdural hematoma since 2008. -Repeat CT of the brain did not show worsening of subdural hematoma.  Neurosurgery recommended conservative management and signed off. Outpatient follow-up with neurosurgery if needed  Acute cardioembolic stroke -MRI of the brain showed scattered embolic lacunar infarcts -Neurology evaluated the patient: She is not a candidate for anticoagulation or antiplatelets because of subdural hematoma. Dose of Lipitor has been decreased to 40 mg daily by neurology. -CTA head and neck showed no significant stenosis -2D echo with bubble showed EF of 65 to 70%, left atrium mildly dilated -CT chest/abdomen and pelvis was ordered by neurology to rule out malignancy: There was no evidence of malignancy on the CT -LDL 25. A1c 5.8 -PT recommending CIR placement. Insurance denied CIR placement.   Patient was seen by speech therapy and cleared for heart healthy diet. CT head was repeated due to concern for new weakness in the right wrist.  CT showed stable findings.  Patient was reassured. Since patient has been denied CIR by her insurance SNF is being pursued.   Patient remains medically stable for discharge.  Community-acquired multilobar bacterial pneumonia COVID-19 testing was negative on presentation.   Completed a course of Rocephin and Zithromax.  Currently on room air  Questionable myelodysplastic syndrome Leukocytosis Thrombocytopenia Anemia of chronic disease: Possibly from above and other chronic illnesses -History of thrombocytosis with recent chronic thrombocytopenia and leukocytosis with concern for MDS. Bone marrow biopsy planned as an outpatient soon.  Vitamin B12 deficiency Recently checked vitamin B12 level was 265.    Started on oral supplementation.  Folic acid level was normal.  CAD Chest pain-free.  Continue statin. Cannot continue antiplatelet because of subdural hematoma. Outpatient follow-up with cardiology  GERD Continue PPI  Hyperlipidemia Statin being continued at a lower dose.  Patient is stable.  Okay for discharge today.  Plan is for SNF depending on insurance and bed availability.   PERTINENT LABS:  The results of significant diagnostics from this hospitalization (including imaging, microbiology, ancillary and laboratory) are listed below for reference.    Microbiology: Recent Results (from the past 240 hour(s))  Blood culture (routine x 2)     Status: None   Collection Time: 12/20/20  4:44 PM   Specimen: BLOOD  Result Value Ref Range Status   Specimen Description BLOOD SITE NOT SPECIFIED  Final   Special Requests   Final    BOTTLES DRAWN AEROBIC AND ANAEROBIC Blood Culture results may not be optimal due to an inadequate volume of blood received in culture bottles   Culture   Final    NO GROWTH 5 DAYS Performed at [facility], [place] [address], [place], [place] [address]    Report Status 12/25/2020 FINAL  Final  Blood culture (routine x 2)     Status: None   Collection Time: 12/20/20  4:44 PM   Specimen: BLOOD  Result Value Ref Range Status   Specimen Description BLOOD SITE NOT SPECIFIED  Final   Special Requests   Final    BOTTLES DRAWN AEROBIC AND ANAEROBIC Blood Culture results may not be optimal due to an inadequate volume of blood received in culture bottles   Culture   Final    NO GROWTH 5 DAYS Performed at [facility], [place] [address], [place], [place] [address]    Report Status 12/25/2020 FINAL  Final  Resp Panel by RT-PCR (Flu A&B, Covid) Nasopharyngeal Swab     Status: None   Collection Time: 12/20/20  4:44 PM   Specimen: Nasopharyngeal Swab; Nasopharyngeal(NP) swabs in vial transport medium  Result Value Ref Range Status   SARS Coronavirus 2 by RT PCR NEGATIVE NEGATIVE Final    Comment: (NOTE) SARS-CoV-2 target nucleic acids are  NOT DETECTED.  The SARS-CoV-2 RNA is generally detectable in upper respiratory specimens during the acute phase of infection. The lowest concentration of SARS-CoV-2 viral copies this assay can detect is 138 copies/mL. A negative result does not preclude SARS-Cov-2 infection and should not be used as the sole basis for treatment or other patient management decisions. A negative result may occur with  improper specimen collection/handling, submission of specimen other than nasopharyngeal swab, presence of viral mutation(s) within the areas targeted by this assay, and inadequate number of viral copies(<138 copies/mL). A negative result must be combined with clinical observations, patient history, and epidemiological information. The expected result is Negative.  Fact Sheet for Patients:  EntrepreneurPulse.com.au  Fact Sheet for Healthcare Providers:  IncredibleEmployment.be  This test is no t yet approved or cleared by the [place] FDA and  has been authorized for detection and/or diagnosis of SARS-CoV-2 by FDA under an Emergency Use Authorization (EUA). This EUA will remain  in effect (meaning this test can be used) for the duration of the COVID-19 declaration under Section 564(b)(1) of the Act, 21 U.S.C.section 360bbb-3(b)(1), unless the authorization is terminated  or revoked sooner.       Influenza A by PCR NEGATIVE NEGATIVE Final   Influenza B by PCR NEGATIVE NEGATIVE Final    Comment: (NOTE) The Xpert Xpress SARS-CoV-2/FLU/RSV plus assay is intended as an aid in the diagnosis of influenza from Nasopharyngeal swab specimens and should not be used as a sole basis for treatment. Nasal washings  and aspirates are unacceptable for Xpert Xpress SARS-CoV-2/FLU/RSV testing.  Fact Sheet for Patients: EntrepreneurPulse.com.au  Fact Sheet for Healthcare Providers: IncredibleEmployment.be  This test is not  yet approved or cleared by the [place] FDA and has been authorized for detection and/or diagnosis of SARS-CoV-2 by FDA under an Emergency Use Authorization (EUA). This EUA will remain in effect (meaning this test can be used) for the duration of the COVID-19 declaration under Section 564(b)(1) of the Act, 21 U.S.C. section 360bbb-3(b)(1), unless the authorization is terminated or revoked.  Performed at [facility], [place] [address], [place], [place] [address]   Urine culture     Status: Abnormal   Collection Time: 12/21/20 12:01 AM   Specimen: Urine, Random  Result Value Ref Range Status   Specimen Description URINE, RANDOM  Final   Special Requests NONE  Final   Culture (A)  Final    <10,000 COLONIES/mL INSIGNIFICANT GROWTH Performed at [facility], [place] [address], [place], [place] [address]    Report Status 12/22/2020 FINAL  Final     Labs:  COVID-19 Labs   Lab Results  Component Value Date   Calcium NEGATIVE 12/20/2020   [place] NEGATIVE 11/25/2019   [place] NEGATIVE 11/01/2019   [place] NEGATIVE 03/01/2019      Basic Metabolic Panel: Recent Labs  Lab 12/22/20 0951 12/28/20 1111  NA 137 137  K 4.4 4.2  CL 107 107  CO2 24 24  GLUCOSE 103* 117*  BUN 11 15  CREATININE 1.05* 1.04*  CALCIUM 8.1* 8.7*  MG 2.3  --    CBC: Recent Labs  Lab 12/22/20 0951 12/28/20 1111  WBC 23.0* 27.4*  NEUTROABS 17.3*  --   HGB 10.0* 9.9*  HCT 32.4* 31.3*  MCV 99.7 98.1  PLT 144* 133*   BNP: BNP (last 3 results) Recent Labs    12/20/20 1525  BNP 400.9*     IMAGING STUDIES CT Angio Head W or Wo Contrast  Result Date: 12/20/2020 CLINICAL DATA:  Neuro deficit, acute, stroke suspected; history of intracerebral aneurysm, headache for several days, multiple falls today, pain in neck, rule out bleed or aneurysmal change. EXAM: CT ANGIOGRAPHY HEAD AND NECK TECHNIQUE: Multidetector CT imaging of the head and neck was performed using the  standard protocol during bolus administration of intravenous contrast. Multiplanar CT image reconstructions and MIPs were obtained to evaluate the vascular anatomy. Carotid stenosis measurements (when applicable) are obtained utilizing NASCET criteria, using the distal internal carotid diameter as the denominator. CONTRAST:  3m OMNIPAQUE IOHEXOL 350 MG/ML SOLN COMPARISON:  Brain MRI 11/25/2019. CT angiogram head/neck 11/25/2019. FINDINGS: CT HEAD FINDINGS Brain: Mild cerebral and cerebellar atrophy. Acute parafalcine subdural hematoma measuring up to 10 mm in greatest thickness (series 5, image 11). Unchanged size of a thin chronic subdural hematoma overlying the left cerebral hemisphere, measuring up to 4 mm, deep to the left-sided cranioplasty. Subacute infarct within the left thalamus. Additional known small subacute infarcts within the supratentorial and infratentorial brain were better appreciated on the prior brain MRI of 11/25/2019. 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 Advanced patchy and ill-defined hypoattenuation within the cerebral white matter, nonspecific but compatible with chronic small vessel ischemic disease. No acute demarcated cortical infarct. No evidence of intracranial mass. No midline shift. Vascular: No hyperdense vessel.  Atherosclerotic calcifications Skull: No calvarial fracture.  Left-sided cranioplasty. Sinuses: Scattered trace mucosal thickening and fluid within the bilateral ethmoid air cells. Small right maxillary sinus mucous retention cyst. Orbits:  No mass or acute finding. Review of the MIP images confirms the above findings CTA NECK FINDINGS Aortic arch: Standard aortic branching. Atherosclerotic plaque within the visualized aortic arch and proximal major branch vessels of the neck. No hemodynamically significant innominate or proximal subclavian artery stenosis. Right carotid system: CCA and ICA patent within the neck without significant stenosis  (50% or greater). Mild calcified plaque within the carotid bifurcation and proximal ICA. Left carotid system: CCA and ICA patent within the neck without significant stenosis (50% or greater). Mild soft and calcified plaque within the carotid bifurcation and proximal ICA. Vertebral arteries: Codominant patent within the neck without stenosis. Mild nonstenotic calcified plaque at the origin of the right vertebral artery. Skeleton: No acute bony abnormality or aggressive osseous lesion. Cervical spondylosis. Cervical levocurvature with partially imaged thoracic dextrocurvature. Other neck: Neck mass or cervical lymphadenopathy. Upper chest: Consolidation within portions of the right upper lobe and within imaged portions of the superior right lower lobe compatible with pneumonia. A small ill-defined opacity also present within the left lung apex likely reflecting an additional site of pneumonia. Prior median sternotomy. Review of the MIP images confirms the above findings CTA HEAD FINDINGS Anterior circulation: The intracranial internal carotid arteries are patent. Plaque within both vessels without stenosis. Unchanged 2 mm inferomedially projecting aneurysm arising from the supraclinoid left ICA (series 12, image 122). The M1 middle cerebral arteries are patent. No M2 proximal branch occlusion or high-grade proximal stenosis is identified. The anterior cerebral arteries are patent. Posterior circulation: The intracranial vertebral arteries are patent. The basilar artery is patent. The posterior cerebral arteries are patent. Posterior communicating arteries are hypoplastic or absent bilaterally. Venous sinuses: Within the limitations of contrast timing, no convincing thrombus. Anatomic variants: As described Review of the MIP images confirms the above findings Acute CT head findings called by telephone at the time of interpretation on 12/20/2020 at 7:55 pm to provider [facility] , who verbally acknowledged these  results. IMPRESSION: CT head: 1. Acute parafalcine subdural hematoma measuring up to 10 mm in greatest thickness. 2. Unchanged thin chronic subdural hematoma overlying the left cerebral hemisphere, deep to the left-sided cranioplasty (4 mm in thickness). 3. Subacute infarct within the left thalamus. Additional known small subacute infarcts within the supratentorial and infratentorial brain were better appreciated on the prior brain MRI of 11/25/2019. 4. 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 5. Stable parenchymal atrophy and severe cerebral white matter chronic small vessel ischemic disease. 6. Mild paranasal sinus disease, as described. CTA neck: 1. The common carotid, internal carotid and vertebral arteries are patent within the neck without hemodynamically significant stenosis. Atherosclerotic disease, as described. 2. Findings compatible right upper lobe, right lower lobe and left upper lobe pneumonia. Right lower lobe pneumonia is incompletely imaged. CTA head: 1. No intracranial large vessel occlusion or proximal high-grade arterial stenosis. 2. 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 Electronically Signed   By: Kellie Simmering DO   On: 12/20/2020 19:57   DG Chest 2 View  Result Date: 12/20/2020 CLINICAL DATA:  Dyspnea, fatigue, congestive heart failure EXAM: CHEST - 2 VIEW COMPARISON:  12/22/2019 FINDINGS: There is focal consolidation within the posterior right upper lobe, likely infectious in the acute setting. The chin overlies the lung apices, however, no definite pneumothorax. No pleural effusion. Cardiac size is mildly enlarged. Coronary artery bypass grafting has been performed. The pulmonary vascularity is normal. No acute bone abnormality. IMPRESSION: Right upper lobe  consolidation in keeping with acute lobar pneumonia in the appropriate clinical setting. Follow-up chest radiograph is recommended in 3-4 weeks to document  resolution and exclude the presence of a central obstructing lesion. Electronically Signed   By: Fidela Salisbury MD   On: 12/20/2020 16:32   CT HEAD WO CONTRAST  Result Date: 12/26/2020 CLINICAL DATA:  Recent fall with follow-up subdural hematoma. EXAM: CT HEAD WITHOUT CONTRAST TECHNIQUE: Contiguous axial images were obtained from the base of the skull through the vertex without intravenous contrast. COMPARISON:  12/21/2020 FINDINGS: Brain: Previously seen inter hemispheric subdural hematoma is again identified but less prominent when compared with the prior exam. It measures approximately 7 mm in greatest thickness with only minimal mass effect upon the adjacent right cerebral hemisphere. Chronic white matter ischemic changes are seen. No findings to suggest acute hemorrhage, acute infarction or space-occupying mass lesion are noted. Vascular: No hyperdense vessel or unexpected calcification. Skull: Postsurgical changes on the left are again seen and stable. Sinuses/Orbits: No acute finding. Other: None. IMPRESSION: Persistent inter hemispheric subdural hematoma with slight decreased attenuation and slight decrease in thickness. Chronic ischemic changes are again seen. Electronically Signed   By: Inez Catalina M.D.   On: 12/26/2020 16:11   CT HEAD WO CONTRAST  Result Date: 12/21/2020 CLINICAL DATA:  Subdural hemorrhage.  Stroke follow-up EXAM: CT HEAD WITHOUT CONTRAST TECHNIQUE: Contiguous axial images were obtained from the base of the skull through the vertex without intravenous contrast. COMPARISON:  Brain MRI from earlier today FINDINGS: Brain: High-density subdural hematoma along the falx measuring up to 8 mm in thickness, non progressed. No associated significant mass effect. Extensive acute and chronic ischemia as characterized by MRI earlier the same day. No hydrocephalus or shift. Vascular: Negative Skull: Unremarkable remote left craniotomy. Sinuses/Orbits: Bilateral cataract resection IMPRESSION: 1.  No interval progression of the inter hemispheric subdural hematoma which measures up to 8 mm in thickness. 2. Acute and chronic ischemia, reference preceding brain MRI. Electronically Signed   By: Monte Fantasia M.D.   On: 12/21/2020 05:01   CT Angio Neck W and/or Wo Contrast  Result Date: 12/20/2020 CLINICAL DATA:  Neuro deficit, acute, stroke suspected; history of intracerebral aneurysm, headache for several days, multiple falls today, pain in neck, rule out bleed or aneurysmal change. EXAM: CT ANGIOGRAPHY HEAD AND NECK TECHNIQUE: Multidetector CT imaging of the head and neck was performed using the standard protocol during bolus administration of intravenous contrast. Multiplanar CT image reconstructions and MIPs were obtained to evaluate the vascular anatomy. Carotid stenosis measurements (when applicable) are obtained utilizing NASCET criteria, using the distal internal carotid diameter as the denominator. CONTRAST:  65m OMNIPAQUE IOHEXOL 350 MG/ML SOLN COMPARISON:  Brain MRI 11/25/2019. CT angiogram head/neck 11/25/2019. FINDINGS: CT HEAD FINDINGS Brain: Mild cerebral and cerebellar atrophy. Acute parafalcine subdural hematoma measuring up to 10 mm in greatest thickness (series 5, image 11). Unchanged size of a thin chronic subdural hematoma overlying the left cerebral hemisphere, measuring up to 4 mm, deep to the left-sided cranioplasty. Subacute infarct within the left thalamus. Additional known small subacute infarcts within the supratentorial and infratentorial brain were better appreciated on the prior brain MRI of 11/25/2019. 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 Advanced patchy and ill-defined hypoattenuation within the cerebral white matter, nonspecific but compatible with chronic small vessel ischemic disease. No acute demarcated cortical infarct. No evidence of intracranial mass. No midline shift. Vascular: No hyperdense vessel.  Atherosclerotic calcifications Skull:  No calvarial fracture.  Left-sided cranioplasty.  Sinuses: Scattered trace mucosal thickening and fluid within the bilateral ethmoid air cells. Small right maxillary sinus mucous retention cyst. Orbits: No mass or acute finding. Review of the MIP images confirms the above findings CTA NECK FINDINGS Aortic arch: Standard aortic branching. Atherosclerotic plaque within the visualized aortic arch and proximal major branch vessels of the neck. No hemodynamically significant innominate or proximal subclavian artery stenosis. Right carotid system: CCA and ICA patent within the neck without significant stenosis (50% or greater). Mild calcified plaque within the carotid bifurcation and proximal ICA. Left carotid system: CCA and ICA patent within the neck without significant stenosis (50% or greater). Mild soft and calcified plaque within the carotid bifurcation and proximal ICA. Vertebral arteries: Codominant patent within the neck without stenosis. Mild nonstenotic calcified plaque at the origin of the right vertebral artery. Skeleton: No acute bony abnormality or aggressive osseous lesion. Cervical spondylosis. Cervical levocurvature with partially imaged thoracic dextrocurvature. Other neck: Neck mass or cervical lymphadenopathy. Upper chest: Consolidation within portions of the right upper lobe and within imaged portions of the superior right lower lobe compatible with pneumonia. A small ill-defined opacity also present within the left lung apex likely reflecting an additional site of pneumonia. Prior median sternotomy. Review of the MIP images confirms the above findings CTA HEAD FINDINGS Anterior circulation: The intracranial internal carotid arteries are patent. Plaque within both vessels without stenosis. Unchanged 2 mm inferomedially projecting aneurysm arising from the supraclinoid left ICA (series 12, image 122). The M1 middle cerebral arteries are patent. No M2 proximal branch occlusion or high-grade proximal  stenosis is identified. The anterior cerebral arteries are patent. Posterior circulation: The intracranial vertebral arteries are patent. The basilar artery is patent. The posterior cerebral arteries are patent. Posterior communicating arteries are hypoplastic or absent bilaterally. Venous sinuses: Within the limitations of contrast timing, no convincing thrombus. Anatomic variants: As described Review of the MIP images confirms the above findings Acute CT head findings called by telephone at the time of interpretation on 12/20/2020 at 7:55 pm to provider [facility] , who verbally acknowledged these results. IMPRESSION: CT head: 1. Acute parafalcine subdural hematoma measuring up to 10 mm in greatest thickness. 2. Unchanged thin chronic subdural hematoma overlying the left cerebral hemisphere, deep to the left-sided cranioplasty (4 mm in thickness). 3. Subacute infarct within the left thalamus. Additional known small subacute infarcts within the supratentorial and infratentorial brain were better appreciated on the prior brain MRI of 11/25/2019. 4. 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 5. Stable parenchymal atrophy and severe cerebral white matter chronic small vessel ischemic disease. 6. Mild paranasal sinus disease, as described. CTA neck: 1. The common carotid, internal carotid and vertebral arteries are patent within the neck without hemodynamically significant stenosis. Atherosclerotic disease, as described. 2. Findings compatible right upper lobe, right lower lobe and left upper lobe pneumonia. Right lower lobe pneumonia is incompletely imaged. CTA head: 1. No intracranial large vessel occlusion or proximal high-grade arterial stenosis. 2. 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 Electronically Signed   By: Kellie Simmering DO   On: 12/20/2020 19:57   MR BRAIN WO CONTRAST  Result Date: 12/21/2020 CLINICAL DATA:  Stroke  symptoms and parafalcine subdural hematoma EXAM: MRI HEAD WITHOUT CONTRAST TECHNIQUE: Multiplanar, multiecho pulse sequences of the brain and surrounding structures were obtained without intravenous contrast. COMPARISON:  Head CT 12/20/2020 FINDINGS: Brain: There is multifocal acute ischemia throughout both cerebral and cerebellar hemispheres. The pattern  is most suggestive watershed infarcts. The subdural hematoma along the falx cerebri is unchanged. Hyperintense T2-weighted signal is moderately widespread throughout the white matter. Generalized volume loss without a clear lobar predilection. The midline structures are normal. Vascular: Major flow voids are preserved. Skull and upper cervical spine: Remote left pterional craniotomy. Sinuses/Orbits:No paranasal sinus fluid levels or advanced mucosal thickening. No mastoid or middle ear effusion. Normal orbits. IMPRESSION: 1. Multifocal acute ischemia throughout both cerebral and cerebellar hemispheres, in a pattern most suggestive of watershed infarcts. 2. Unchanged subdural hematoma along the falx cerebri. Electronically Signed   By: Ulyses Jarred M.D.   On: 12/21/2020 00:55   CT CHEST ABDOMEN PELVIS W CONTRAST  Result Date: 12/21/2020 CLINICAL DATA:  Multiple falls yesterday EXAM: CT CHEST, ABDOMEN, AND PELVIS WITH CONTRAST TECHNIQUE: Multidetector CT imaging of the chest, abdomen and pelvis was performed following the standard protocol during bolus administration of intravenous contrast. CONTRAST:  133m OMNIPAQUE IOHEXOL 300 MG/ML  SOLN COMPARISON:  12/20/2020, 02/22/2020 FINDINGS: CT CHEST FINDINGS Cardiovascular: The heart and great vessels are unremarkable with no pericardial effusion. Dense calcification of the mitral annulus. Extensive atherosclerosis of the native coronary vessels, with evidence of prior CABG. No evidence of thoracic aortic aneurysm or dissection. Moderate atherosclerosis of the aortic arch. Mediastinum/Nodes: No enlarged mediastinal,  hilar, or axillary lymph nodes. Thyroid gland, trachea, and esophagus demonstrate no significant findings. Lungs/Pleura: There is a 6 mm right upper lobe pulmonary nodule reference image 29/6, and a 5 mm right middle lobe pulmonary nodule reference image 39/6. These are unchanged since 2020 and can be considered benign. There is dense consolidation within the right upper lobe and superior segment right lower lobe with air bronchograms, compatible with pneumonia. Nodular area of consolidation within the superior segment left lower lobe image 30/6 measures 9 mm, and may reflect an additional focus of infection though follow-up CT will be needed to assess resolution. No effusion or pneumothorax.  The central airways are patent. Musculoskeletal: There are no acute or destructive bony lesions. Reconstructed images demonstrate no additional findings. CT ABDOMEN PELVIS FINDINGS Hepatobiliary: High attenuation material within the gallbladder likely reflects vicarious excretion of previously administered contrast. No evidence of cholelithiasis or cholecystitis. Liver is unremarkable. Pancreas: Unremarkable. No pancreatic ductal dilatation or surrounding inflammatory changes. Spleen: Borderline splenomegaly measuring 12.1 cm in anterior-posterior dimension, stable. No focal abnormalities. Adrenals/Urinary Tract: Stable hypodensities right kidney compatible with small cysts. No urinary tract calculi or obstructive uropathy. Excreted contrast is seen within the urinary bladder, with no filling defects. The adrenals are unremarkable. Stomach/Bowel: No bowel obstruction or ileus. Diffuse diverticulosis of the distal colon without diverticulitis. No bowel wall thickening or inflammatory change. Vascular/Lymphatic: Aortic atherosclerosis. No enlarged abdominal or pelvic lymph nodes. Reproductive: Status post hysterectomy. No adnexal masses. Other: No free fluid or free gas.  No abdominal wall hernia. Musculoskeletal: No acute or  destructive bony lesions. Reconstructed images demonstrate no additional findings. IMPRESSION: 1. No acute intrathoracic, intra-abdominal, or intrapelvic trauma. 2. Multifocal pneumonia, most pronounced in the right upper and right lower lobes. 3. Nodular consolidation within the superior segment left lower lobe measuring 9 mm, favor infection. CT follow-up will be needed after appropriate medical management to document resolution. 4. Benign subcentimeter nodules within the right upper and right middle lobes. 5. Borderline splenomegaly. 6.  Aortic Atherosclerosis (ICD10-I70.0). Electronically Signed   By: MRanda NgoM.D.   On: 12/21/2020 15:34   DG Knee Complete 4 Views Left  Result Date: 12/20/2020 CLINICAL DATA:  Pain  following fall EXAM: LEFT KNEE - COMPLETE 4+ VIEW COMPARISON:  None. FINDINGS: Frontal, lateral, and bilateral oblique views were obtained. There is no fracture or dislocation. No joint effusion. There is moderate narrowing medially and in the patellofemoral joint region. There is spurring in all compartments. No erosive changes. There are foci of arterial vascular calcification. 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 No fracture, dislocation, or effusion. Atherosclerotic arterial vascular calcifications noted. Electronically Signed   By: Lowella Grip III M.D.   On: 12/20/2020 16:28   DG Knee Complete 4 Views Right  Result Date: 12/20/2020 CLINICAL DATA:  Pain following fall EXAM: RIGHT KNEE - COMPLETE 4+ VIEW COMPARISON:  None. FINDINGS: Frontal, lateral, and bilateral oblique views were obtained. No appreciable fracture or dislocation. No joint effusion. There is moderate narrowing of the patellofemoral joint. There is mild narrowing medially. There is spurring in all compartments. No erosive change. There are multiple foci of arterial vascular calcification. Surgical clips are noted medial to the proximal tibia. IMPRESSION:  Osteoarthritic change early, primarily in the patellofemoral joint and medial compartments. No fracture, dislocation, or effusion. Foci of atherosclerotic arterial vascular calcification noted. Electronically Signed   By: Lowella Grip III M.D.   On: 12/20/2020 16:29   CUP PACEART REMOTE DEVICE CHECK  Result Date: 12/12/2020 ILR summary report received. Battery status OK. Normal device function. No new symptom, tachy, brady, or pause episodes. No new AF episodes. Monthly summary reports and ROV/PRN  ECHOCARDIOGRAM COMPLETE BUBBLE STUDY  Result Date: 12/21/2020    ECHOCARDIOGRAM REPORT   Patient Name:   Yolanda Flowers Date of Exam: 12/21/2020 Medical Rec #:  [id number]      Height:       66.0 in Accession #:    [id number]     Weight:       170.0 lb Date of Birth:  [birth year]     BSA:          1.866 m Patient Age:    54 years       BP:           125/73 mmHg Patient Gender: F              HR:           71 bpm. Exam Location:  Inpatient Procedure: 2D Echo, Cardiac Doppler, Color Doppler and Saline Contrast Bubble            Study Indications:    CVA  History:        Patient has prior history of Echocardiogram examinations, most                 recent 11/25/2019. CAD, Stroke and COPD, Signs/Symptoms:Shortness                 of Breath; Risk Factors:Hypertension and Dyslipidemia.  Sonographer:    [place] Referring Phys: [id number] [place]  1. Agitated saline contrast bubble study was negative, with no evidence of any interatrial shunt.  2. Left ventricular ejection fraction, by estimation, is 65 to 70%. The left ventricle has normal function. The left ventricle has no regional wall motion abnormalities. Left ventricular diastolic parameters are consistent with Grade I diastolic dysfunction (impaired relaxation).  3. Right ventricular systolic function is normal. The right ventricular size is normal. There is normal pulmonary artery systolic pressure. The estimated right ventricular  systolic pressure is 70.2 mmHg.  4. Left atrial size was mildly dilated.  5. The mitral valve is abnormal. Trivial mitral valve regurgitation. The mean mitral valve gradient is 3.0 mmHg with average heart rate of 72 bpm. Moderate mitral annular calcification. Calcified papillary muscle noted, not as well seen in prior study.  6. The aortic valve is tricuspid. Aortic valve regurgitation is not visualized. Aortic valve sclerosis is present, with no evidence of aortic valve stenosis. Comparison(s): A prior study was performed on 11/25/19. No significant change from prior study. Prior images reviewed side by side. FINDINGS  Left Ventricle: Left ventricular ejection fraction, by estimation, is 65 to 70%. The left ventricle has normal function. The left ventricle has no regional wall motion abnormalities. The left ventricular internal cavity size was normal in size. There is  no left ventricular hypertrophy of the basal-septal segment. Left ventricular diastolic parameters are consistent with Grade I diastolic dysfunction (impaired relaxation). Right Ventricle: The right ventricular size is normal. No increase in right ventricular wall thickness. Right ventricular systolic function is normal. There is normal pulmonary artery systolic pressure. The tricuspid regurgitant velocity is 2.84 m/s, and  with an assumed right atrial pressure of 3 mmHg, the estimated right ventricular systolic pressure is 46.8 mmHg. Left Atrium: Left atrial size was mildly dilated. Right Atrium: Right atrial size was normal in size. Pericardium: Trivial pericardial effusion is present. Mitral Valve: Calcified papillary muscle noted. The mitral valve is abnormal. There is moderate calcification of the mitral valve leaflet(s). Moderate mitral annular calcification. Trivial mitral valve regurgitation. MV peak gradient, 7.8 mmHg. The mean mitral valve gradient is 3.0 mmHg with average heart rate of 72 bpm. Tricuspid Valve: The tricuspid valve is grossly  normal. Tricuspid valve regurgitation is mild. Aortic Valve: The aortic valve is tricuspid. Aortic valve regurgitation is not visualized. Mild aortic valve sclerosis is present, with no evidence of aortic valve stenosis. Aortic valve mean gradient measures 7.0 mmHg. Aortic valve peak gradient measures 14.2 mmHg. Aortic valve area, by VTI measures 2.04 cm. Pulmonic Valve: The pulmonic valve was grossly normal. Pulmonic valve regurgitation is not visualized. No evidence of pulmonic stenosis. Aorta: The aortic root and ascending aorta are structurally normal, with no evidence of dilitation. IAS/Shunts: The atrial septum is grossly normal. Agitated saline contrast was given intravenously to evaluate for intracardiac shunting. Agitated saline contrast bubble study was negative, with no evidence of any interatrial shunt.  LEFT VENTRICLE PLAX 2D LVIDd:         5.00 cm     Diastology LVIDs:         1.70 cm     LV e' medial:    4.79 cm/s LV PW:         0.80 cm     LV E/e' medial:  15.3 LV IVS:        0.90 cm     LV e' lateral:   7.94 cm/s LVOT diam:     1.80 cm     LV E/e' lateral: 9.2 LV SV:         72 LV SV Index:   38 LVOT Area:     2.54 cm  LV Volumes (MOD) LV vol d, MOD A2C: 29.3 ml LV vol d, MOD A4C: 36.6 ml LV vol s, MOD A2C: 8.6 ml LV vol s, MOD A4C: 15.8 ml LV SV MOD A2C:     20.7 ml LV SV MOD A4C:     36.6 ml LV SV MOD BP:      20.5 ml RIGHT VENTRICLE RV S prime:  16.00 cm/s  PULMONARY VEINS TAPSE (M-mode): 1.8 cm      A Reversal Duration: 116.00 msec                             A Reversal Velocity: 33.20 cm/s                             Diastolic Velocity:  09.98 cm/s                             S/D Velocity:        1.50                             Systolic Velocity:   33.82 cm/s LEFT ATRIUM             Index       RIGHT ATRIUM           Index LA diam:        3.70 cm 1.98 cm/m  RA Area:     16.10 cm LA Vol (A2C):   77.2 ml 41.36 ml/m RA Volume:   38.80 ml  20.79 ml/m LA Vol (A4C):   48.1 ml 25.77 ml/m LA  Biplane Vol: 65.3 ml 34.99 ml/m  AORTIC VALVE                    PULMONIC VALVE AV Area (Vmax):    2.24 cm     PV Vmax:       0.88 m/s AV Area (Vmean):   2.19 cm     PV Vmean:      64.500 cm/s AV Area (VTI):     2.04 cm     PV VTI:        0.179 m AV Vmax:           188.50 cm/s  PV Peak grad:  3.1 mmHg AV Vmean:          119.500 cm/s PV Mean grad:  2.0 mmHg AV VTI:            0.351 m AV Peak Grad:      14.2 mmHg AV Mean Grad:      7.0 mmHg LVOT Vmax:         166.00 cm/s LVOT Vmean:        103.000 cm/s LVOT VTI:          0.281 m LVOT/AV VTI ratio: 0.80  AORTA Ao Root diam: 3.50 cm Ao Asc diam:  3.20 cm MITRAL VALVE                TRICUSPID VALVE MV Area (PHT): 1.62 cm     TR Peak grad:   32.3 mmHg MV Area VTI:   2.03 cm     TR Vmax:        284.00 cm/s MV Peak grad:  7.8 mmHg MV Mean grad:  3.0 mmHg     SHUNTS MV Vmax:       1.40 m/s     Systemic VTI:  0.28 m MV Vmean:      73.7 cm/s    Systemic Diam: 1.80 cm MV Decel Time: 468 msec MR Peak grad: 43.8 mmHg MR Vmax:      331.00 cm/s MV E velocity: 73.30 cm/s MV  A velocity: 127.00 cm/s MV E/A ratio:  0.58 Rudean Haskell MD Electronically signed by Rudean Haskell MD Signature Date/Time: 12/21/2020/11:24:53 AM    Final    VAS [place] LOWER EXTREMITY VENOUS (DVT)  Result Date: 12/22/2020  Lower Venous DVT Study Patient Name:  Yolanda Flowers  Date of Exam:   12/21/2020 Medical Rec #: [id number]       Accession #:    [id number] Date of Birth: 04-28-1941      Patient Gender: F Patient Age:   79Y Exam Location:  [facility] Procedure:      VAS [place] LOWER EXTREMITY VENOUS (DVT) Referring Phys: [id number] Rosalin Hawking --------------------------------------------------------------------------------  Indications: Stroke.  Risk Factors: None identified. Comparison Study: No prior studies. Performing Technologist: Oliver Hum RVT  Examination Guidelines: A complete evaluation includes B-mode imaging, spectral Doppler, color Doppler, and power Doppler as needed of all  accessible portions of each vessel. Bilateral testing is considered an integral part of a complete examination. Limited examinations for reoccurring indications may be performed as noted. The reflux portion of the exam is performed with the patient in reverse Trendelenburg.  +---------+---------------+---------+-----------+----------+--------------+ RIGHT    CompressibilityPhasicitySpontaneityPropertiesThrombus Aging +---------+---------------+---------+-----------+----------+--------------+ CFV      Full           Yes      Yes                                 +---------+---------------+---------+-----------+----------+--------------+ SFJ      Full                                                        +---------+---------------+---------+-----------+----------+--------------+ FV Prox  Full                                                        +---------+---------------+---------+-----------+----------+--------------+ FV Mid   Full                                                        +---------+---------------+---------+-----------+----------+--------------+ FV DistalFull                                                        +---------+---------------+---------+-----------+----------+--------------+ PFV      Full                                                        +---------+---------------+---------+-----------+----------+--------------+ POP      Full           Yes      Yes                                 +---------+---------------+---------+-----------+----------+--------------+  PTV      Full                                                        +---------+---------------+---------+-----------+----------+--------------+ PERO     Full                                                        +---------+---------------+---------+-----------+----------+--------------+   +---------+---------------+---------+-----------+----------+--------------+  LEFT     CompressibilityPhasicitySpontaneityPropertiesThrombus Aging +---------+---------------+---------+-----------+----------+--------------+ CFV      Full           Yes      Yes                                 +---------+---------------+---------+-----------+----------+--------------+ SFJ      Full                                                        +---------+---------------+---------+-----------+----------+--------------+ FV Prox  Full                                                        +---------+---------------+---------+-----------+----------+--------------+ FV Mid   Full                                                        +---------+---------------+---------+-----------+----------+--------------+ FV DistalFull                                                        +---------+---------------+---------+-----------+----------+--------------+ PFV      Full                                                        +---------+---------------+---------+-----------+----------+--------------+ POP      Full           Yes      Yes                                 +---------+---------------+---------+-----------+----------+--------------+ PTV      Full                                                        +---------+---------------+---------+-----------+----------+--------------+  PERO     Full                                                        +---------+---------------+---------+-----------+----------+--------------+     Summary: RIGHT: - There is no evidence of deep vein thrombosis in the lower extremity.  - No cystic structure found in the popliteal fossa.  LEFT: - There is no evidence of deep vein thrombosis in the lower extremity.  - No cystic structure found in the popliteal fossa.  *See table(s) above for measurements and observations. Electronically signed by Ruta Hinds MD on 12/22/2020 at 5:35:21 PM.    Final     DISCHARGE  EXAMINATION: Vitals:   12/28/20 2001 12/29/20 0001 12/29/20 0333 12/29/20 0742  BP: 136/69 126/73 126/65 122/71  Pulse: 77 73 69 70  Resp: _0 Temp: 98.1 F (36.7 C) 97.6 F (36.4 C) 97.7 F (36.5 C) 98.1 F (36.7 C)  TempSrc: Oral Oral Oral Oral  SpO2: 95% 93% 95% 95%  Weight:      Height:       General appearance: Awake alert.  In no distress Resp: Clear to auscultation bilaterally.  Normal effort Cardio: S1-S2 is normal regular.  No S3-S4.  No rubs murmurs or bruit GI: Abdomen is soft.  Nontender nondistended.  Bowel sounds are present normal.  No masses organomegaly    DISPOSITION: SNF versus home with home health  Discharge Instructions    Ambulatory referral to Neurology   Complete by: As directed    Follow up with Dr. Leonie Man at [facility] in 4-6 weeks. Too complicated for RN to follow. Thanks.   Call MD for:  difficulty breathing, headache or visual disturbances   Complete by: As directed    Call MD for:  extreme fatigue   Complete by: As directed    Call MD for:  persistant dizziness or light-headedness   Complete by: As directed    Call MD for:  persistant nausea and vomiting   Complete by: As directed    Call MD for:  severe uncontrolled pain   Complete by: As directed    Call MD for:  temperature >100.4   Complete by: As directed    Diet - low sodium heart healthy   Complete by: As directed    Discharge instructions   Complete by: As directed    Please review instructions on the discharge summary.  You were cared for by a hospitalist during your hospital stay. If you have any questions about your discharge medications or the care you received while you were in the hospital after you are discharged, you can call the unit and asked to speak with the hospitalist on call if the hospitalist that took care of you is not available. Once you are discharged, your primary care physician will handle any further medical issues. Please note that NO REFILLS for any  discharge medications will be authorized once you are discharged, as it is imperative that you return to your primary care physician (or establish a relationship with a primary care physician if you do not have one) for your aftercare needs so that they can reassess your need for medications and monitor your lab values. If you do not have a primary care physician, you can call [phone] for a physician referral.  Increase activity slowly   Complete by: As directed         Allergies as of 12/29/2020      Reactions   Cephalexin Hives   Penicillin G    Ciprofloxacin Other (See Comments)   Gi intolerance   Penicillins Rash   Childhood reaction Did it involve swelling of the face/tongue/throat, SOB, or low BP? No Did it involve sudden or severe rash/hives, skin peeling, or any reaction on the inside of your mouth or nose? No Did you need to seek medical attention at a hospital or doctor's office? No When did it last happen?50 years ago If all above answers are "NO", may proceed with cephalosporin use.      Medication List    STOP taking these medications   clopidogrel 75 MG tablet Commonly known as: PLAVIX     TAKE these medications   acetaminophen 325 MG tablet Commonly known as: TYLENOL Take 1-2 tablets (325-650 mg total) by mouth every 4 (four) hours as needed for mild pain.   ammonium lactate 12 % lotion Commonly known as: LAC-HYDRIN Apply 1 application topically as needed for dry skin.   atorvastatin 40 MG tablet Commonly known as: LIPITOR Take 1 tablet (40 mg total) by mouth daily. What changed:   medication strength  how much to take  additional instructions   CALCIUM 600 + D PO Take 1 tablet by mouth in the morning and at bedtime.   fluticasone 50 MCG/ACT nasal spray Commonly known as: FLONASE Place 2 sprays into both nostrils at bedtime.   Magnesium 250 MG Tabs Take 250 mg by mouth at bedtime.   metoprolol tartrate 25 MG tablet Commonly known as:  LOPRESSOR Take 1 tablet (25 mg total) by mouth 2 (two) times daily.   pantoprazole 40 MG tablet Commonly known as: PROTONIX Take 1 tablet (40 mg total) by mouth daily.   polyethylene glycol 17 g packet Commonly known as: MIRALAX / GLYCOLAX Take 17 g by mouth daily as needed for moderate constipation.   PreserVision AREDS 2 Caps Take 1 capsule by mouth in the morning and at bedtime.   traMADol 50 MG tablet Commonly known as: ULTRAM Take 1 tablet (50 mg total) by mouth every 6 (six) hours as needed for moderate pain.   vitamin B-12 500 MCG tablet Commonly known as: CYANOCOBALAMIN Take 1 tablet (500 mcg total) by mouth daily.   vitamin C 1000 MG tablet Take 1,000 mg by mouth daily.   Vitamin D (Ergocalciferol) 1.25 MG (50000 UNIT) Caps capsule Commonly known as: DRISDOL Take 50,000 Units by mouth every 14 (fourteen) days.         Follow-up Information    [name], MD. Schedule an appointment as soon as possible for a visit in 1 week(s).   Specialty: Internal Medicine Contact information: [place] [address] [phone]        Eustace Moore, MD. Schedule an appointment as soon as possible for a visit in 1 week(s).   Specialty: Neurosurgery Contact information: [address] [place] [address] [place] [address] [phone]        Garvin Fila, MD. Schedule an appointment as soon as possible for a visit in 4 week(s).   Specialties: Neurology, Radiology Contact information: [address] [place] [address] [place]: 35 minutes  Bonnielee Haff  [facility] Pager  on [email or web address]  12/29/2020, 9:08 AM

## 2020-12-29 NOTE — TOC Transition Note (Signed)
Transition of Care [facility]) - CM/SW Discharge Note   Patient Details  Name: Yolanda Flowers MRN: [id number] Date of Birth: 1941-02-06  [facility] CM/SW Contact:  Coralee Pesa, [place] Phone Number: 12/29/2020, 1:12 PM   Clinical Narrative:    Pt to be transported to [facility] by family.  Nurse to call report to [phone]   Final next level of care: Skilled Nursing Facility Barriers to Discharge: Barriers Resolved   Patient Goals and CMS Choice   CMS Medicare.gov Compare Post Acute Care list provided to:: Patient Represenative (must comment) (daughter in law: [name]) Choice offered to / list presented to : Adult Children ([place])  Discharge Placement              Patient chooses bed at: [facility] Patient to be transferred to facility by: Family      Discharge Plan and Services In-house Referral: Clinical Social Work   Post Acute Care Choice: [facility]          DME Arranged: N/A DME Agency: NA       HH Arranged: NA HH Agency: NA        Social Determinants of Health (SDOH) Interventions     Readmission Risk Interventions No flowsheet data found.

## 2020-12-29 NOTE — Progress Notes (Signed)
Carelink Summary Report / Loop Recorder 

## 2020-12-30 DIAGNOSIS — Z79899 Other long term (current) drug therapy: Secondary | ICD-10-CM | POA: Diagnosis not present

## 2020-12-31 DIAGNOSIS — J159 Unspecified bacterial pneumonia: Secondary | ICD-10-CM | POA: Diagnosis not present

## 2020-12-31 DIAGNOSIS — I639 Cerebral infarction, unspecified: Secondary | ICD-10-CM | POA: Diagnosis not present

## 2020-12-31 DIAGNOSIS — J449 Chronic obstructive pulmonary disease, unspecified: Secondary | ICD-10-CM | POA: Diagnosis not present

## 2020-12-31 DIAGNOSIS — G4733 Obstructive sleep apnea (adult) (pediatric): Secondary | ICD-10-CM | POA: Diagnosis not present

## 2020-12-31 DIAGNOSIS — S065X9A Traumatic subdural hemorrhage with loss of consciousness of unspecified duration, initial encounter: Secondary | ICD-10-CM | POA: Diagnosis not present

## 2020-12-31 DIAGNOSIS — I509 Heart failure, unspecified: Secondary | ICD-10-CM | POA: Diagnosis not present

## 2021-01-02 ENCOUNTER — Ambulatory Visit: Payer: PPO | Admitting: Hematology and Oncology

## 2021-01-02 ENCOUNTER — Inpatient Hospital Stay: Payer: PPO

## 2021-01-03 ENCOUNTER — Inpatient Hospital Stay: Payer: PPO | Admitting: Hematology and Oncology

## 2021-01-04 DIAGNOSIS — S065X9A Traumatic subdural hemorrhage with loss of consciousness of unspecified duration, initial encounter: Secondary | ICD-10-CM | POA: Diagnosis not present

## 2021-01-05 DIAGNOSIS — Z79899 Other long term (current) drug therapy: Secondary | ICD-10-CM | POA: Diagnosis not present

## 2021-01-08 ENCOUNTER — Other Ambulatory Visit: Payer: Self-pay | Admitting: Neurological Surgery

## 2021-01-08 DIAGNOSIS — S065X9A Traumatic subdural hemorrhage with loss of consciousness of unspecified duration, initial encounter: Secondary | ICD-10-CM

## 2021-01-08 DIAGNOSIS — S065XAA Traumatic subdural hemorrhage with loss of consciousness status unknown, initial encounter: Secondary | ICD-10-CM

## 2021-01-15 LAB — CUP PACEART REMOTE DEVICE CHECK
Date Time Interrogation Session: 20220527230344
Implantable Pulse Generator Implant Date: 20210413

## 2021-01-16 ENCOUNTER — Ambulatory Visit (INDEPENDENT_AMBULATORY_CARE_PROVIDER_SITE_OTHER): Payer: PPO

## 2021-01-16 DIAGNOSIS — I639 Cerebral infarction, unspecified: Secondary | ICD-10-CM

## 2021-01-18 DIAGNOSIS — Z79899 Other long term (current) drug therapy: Secondary | ICD-10-CM | POA: Diagnosis not present

## 2021-01-22 DIAGNOSIS — D72829 Elevated white blood cell count, unspecified: Secondary | ICD-10-CM | POA: Diagnosis not present

## 2021-01-22 DIAGNOSIS — Z9181 History of falling: Secondary | ICD-10-CM | POA: Diagnosis not present

## 2021-01-22 DIAGNOSIS — I639 Cerebral infarction, unspecified: Secondary | ICD-10-CM | POA: Diagnosis not present

## 2021-01-22 DIAGNOSIS — G4733 Obstructive sleep apnea (adult) (pediatric): Secondary | ICD-10-CM | POA: Diagnosis not present

## 2021-01-22 DIAGNOSIS — I509 Heart failure, unspecified: Secondary | ICD-10-CM | POA: Diagnosis not present

## 2021-01-22 DIAGNOSIS — Z951 Presence of aortocoronary bypass graft: Secondary | ICD-10-CM | POA: Diagnosis not present

## 2021-01-22 DIAGNOSIS — E538 Deficiency of other specified B group vitamins: Secondary | ICD-10-CM | POA: Diagnosis not present

## 2021-01-22 DIAGNOSIS — D75839 Thrombocytosis, unspecified: Secondary | ICD-10-CM | POA: Diagnosis not present

## 2021-01-22 DIAGNOSIS — S065X0D Traumatic subdural hemorrhage without loss of consciousness, subsequent encounter: Secondary | ICD-10-CM | POA: Diagnosis not present

## 2021-01-22 DIAGNOSIS — K219 Gastro-esophageal reflux disease without esophagitis: Secondary | ICD-10-CM | POA: Diagnosis not present

## 2021-01-22 DIAGNOSIS — Z8673 Personal history of transient ischemic attack (TIA), and cerebral infarction without residual deficits: Secondary | ICD-10-CM | POA: Diagnosis not present

## 2021-01-22 DIAGNOSIS — J3489 Other specified disorders of nose and nasal sinuses: Secondary | ICD-10-CM | POA: Diagnosis not present

## 2021-01-22 DIAGNOSIS — I110 Hypertensive heart disease with heart failure: Secondary | ICD-10-CM | POA: Diagnosis not present

## 2021-01-22 DIAGNOSIS — J189 Pneumonia, unspecified organism: Secondary | ICD-10-CM | POA: Diagnosis not present

## 2021-01-22 DIAGNOSIS — D638 Anemia in other chronic diseases classified elsewhere: Secondary | ICD-10-CM | POA: Diagnosis not present

## 2021-01-22 DIAGNOSIS — I2510 Atherosclerotic heart disease of native coronary artery without angina pectoris: Secondary | ICD-10-CM | POA: Diagnosis not present

## 2021-01-22 DIAGNOSIS — E785 Hyperlipidemia, unspecified: Secondary | ICD-10-CM | POA: Diagnosis not present

## 2021-01-22 DIAGNOSIS — J440 Chronic obstructive pulmonary disease with acute lower respiratory infection: Secondary | ICD-10-CM | POA: Diagnosis not present

## 2021-01-22 DIAGNOSIS — E559 Vitamin D deficiency, unspecified: Secondary | ICD-10-CM | POA: Diagnosis not present

## 2021-01-22 DIAGNOSIS — D696 Thrombocytopenia, unspecified: Secondary | ICD-10-CM | POA: Diagnosis not present

## 2021-01-23 ENCOUNTER — Other Ambulatory Visit: Payer: PPO

## 2021-02-01 DIAGNOSIS — J45909 Unspecified asthma, uncomplicated: Secondary | ICD-10-CM | POA: Diagnosis not present

## 2021-02-01 DIAGNOSIS — Z6826 Body mass index (BMI) 26.0-26.9, adult: Secondary | ICD-10-CM | POA: Diagnosis not present

## 2021-02-01 DIAGNOSIS — Z8673 Personal history of transient ischemic attack (TIA), and cerebral infarction without residual deficits: Secondary | ICD-10-CM | POA: Diagnosis not present

## 2021-02-01 DIAGNOSIS — M545 Low back pain, unspecified: Secondary | ICD-10-CM | POA: Diagnosis not present

## 2021-02-01 DIAGNOSIS — E559 Vitamin D deficiency, unspecified: Secondary | ICD-10-CM | POA: Diagnosis not present

## 2021-02-01 DIAGNOSIS — I2510 Atherosclerotic heart disease of native coronary artery without angina pectoris: Secondary | ICD-10-CM | POA: Diagnosis not present

## 2021-02-01 DIAGNOSIS — Z09 Encounter for follow-up examination after completed treatment for conditions other than malignant neoplasm: Secondary | ICD-10-CM | POA: Diagnosis not present

## 2021-02-01 DIAGNOSIS — E785 Hyperlipidemia, unspecified: Secondary | ICD-10-CM | POA: Diagnosis not present

## 2021-02-01 DIAGNOSIS — J449 Chronic obstructive pulmonary disease, unspecified: Secondary | ICD-10-CM | POA: Diagnosis not present

## 2021-02-01 DIAGNOSIS — Z79899 Other long term (current) drug therapy: Secondary | ICD-10-CM | POA: Diagnosis not present

## 2021-02-02 DIAGNOSIS — M1612 Unilateral primary osteoarthritis, left hip: Secondary | ICD-10-CM | POA: Diagnosis not present

## 2021-02-08 ENCOUNTER — Ambulatory Visit
Admission: RE | Admit: 2021-02-08 | Discharge: 2021-02-08 | Disposition: A | Discharge status: Home / Self Care | Payer: PPO | Source: Ambulatory Visit | Attending: Neurological Surgery | Admitting: Neurological Surgery

## 2021-02-08 DIAGNOSIS — S065X9A Traumatic subdural hemorrhage with loss of consciousness of unspecified duration, initial encounter: Secondary | ICD-10-CM

## 2021-02-08 DIAGNOSIS — G319 Degenerative disease of nervous system, unspecified: Secondary | ICD-10-CM | POA: Diagnosis not present

## 2021-02-08 DIAGNOSIS — S065XAA Traumatic subdural hemorrhage with loss of consciousness status unknown, initial encounter: Secondary | ICD-10-CM

## 2021-02-08 DIAGNOSIS — I6200 Nontraumatic subdural hemorrhage, unspecified: Secondary | ICD-10-CM | POA: Diagnosis not present

## 2021-02-08 IMAGING — CT CT HEAD W/O CM
1 series · 16 of 30 positions shown, 20 images · non-contrast
Comparison: CT head [DATE]

CLINICAL DATA: Follow-up subdural hematoma

EXAM:
CT HEAD WITHOUT CONTRAST
TECHNIQUE: Contiguous axial images were obtained from the base of the skull
through the vertex without intravenous contrast.

[Series 2: head w/(date) · axial · 0.46mm/px · z∈[-186,-32]mm · 16 of 35 slices shown, 20 images]
[im 2/35  brain]
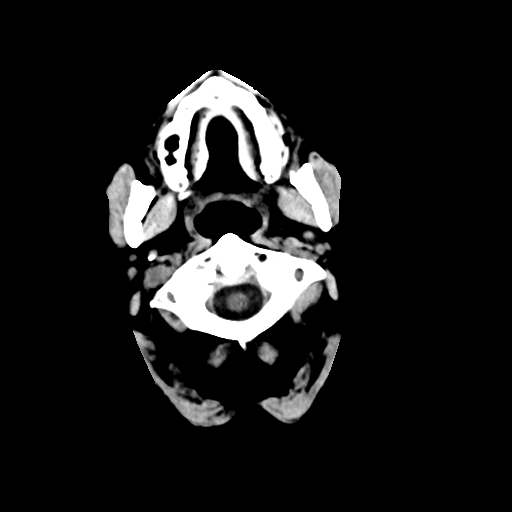
[im 2/35  bone]
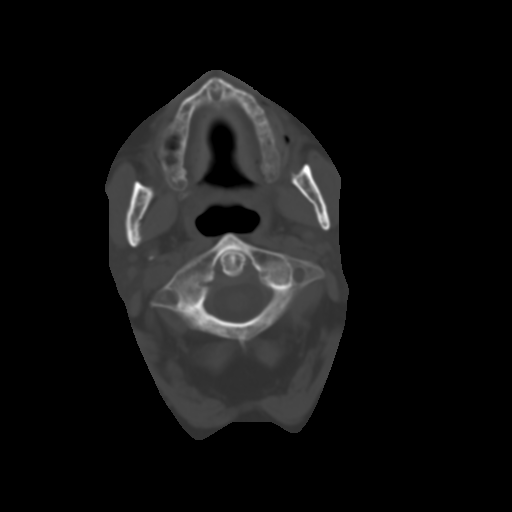
[im 4/35  brain]
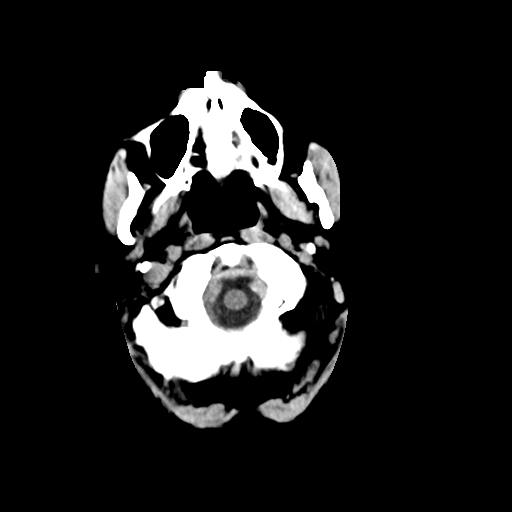
[im 6/35  brain]
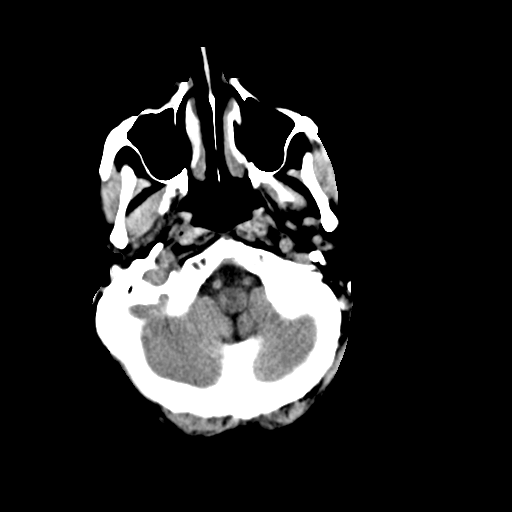
[im 9/35  brain]
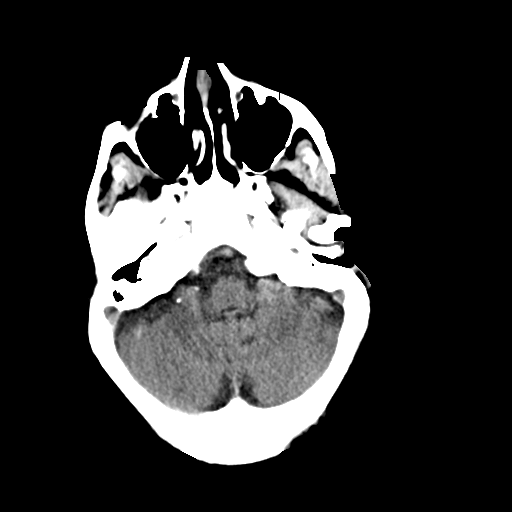
[im 10/35  brain]
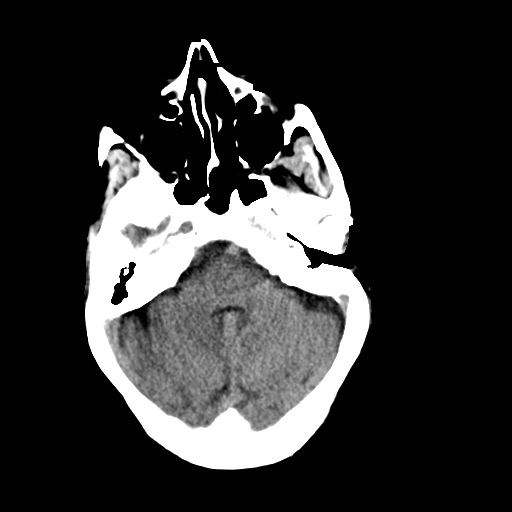
[im 10/35  bone]
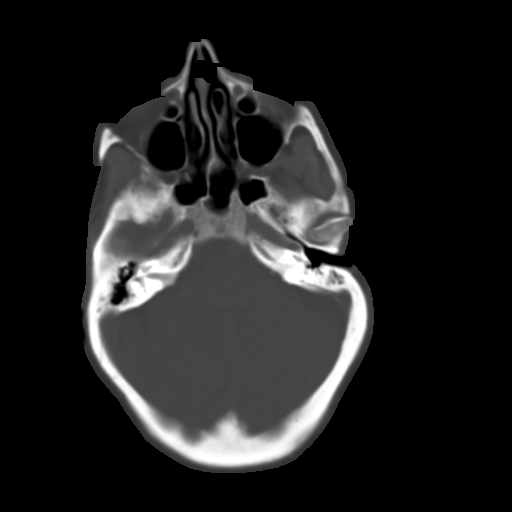
[im 12/35  brain]
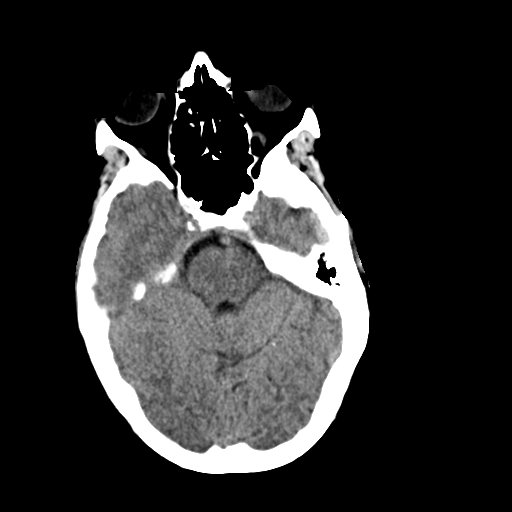
[im 15/35  brain]
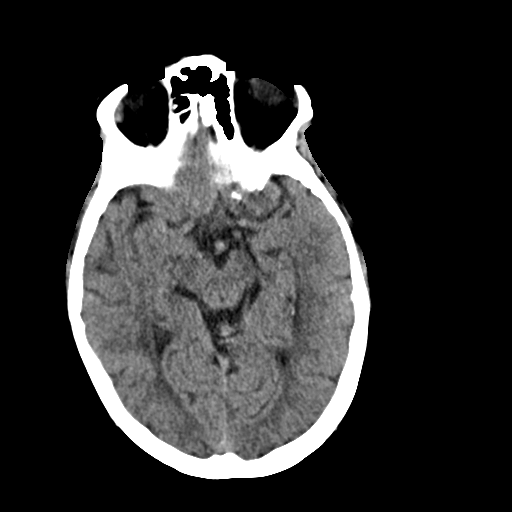
[im 17/35  brain]
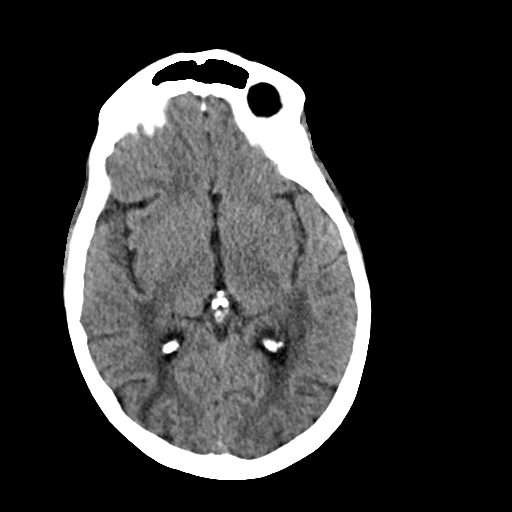
[im 18/35  brain]
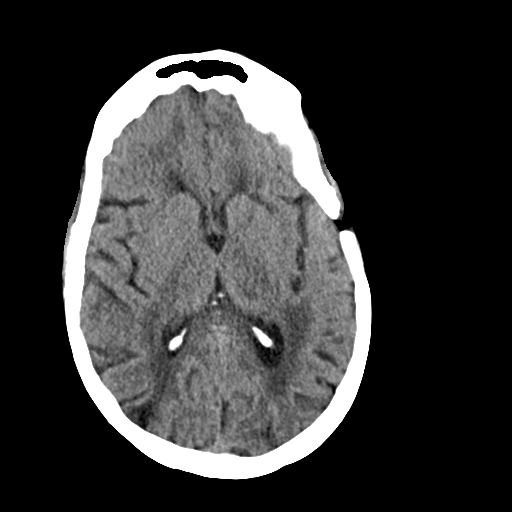
[im 18/35  bone]
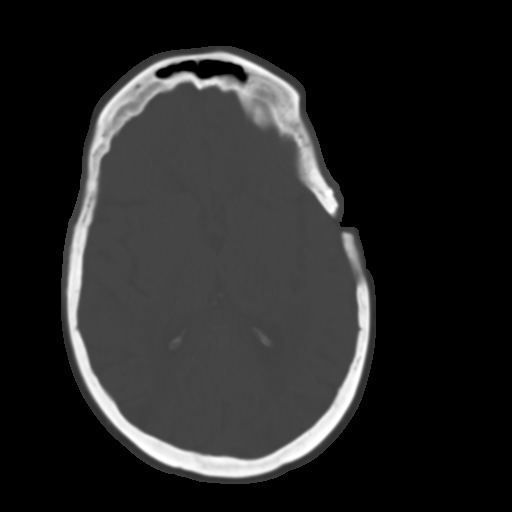
[im 20/35  brain]
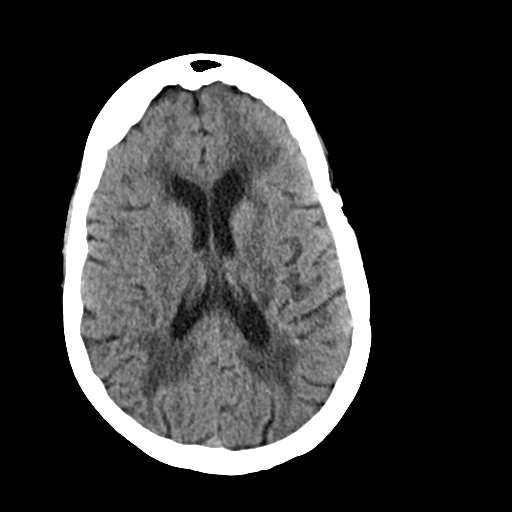
[im 23/35  brain]
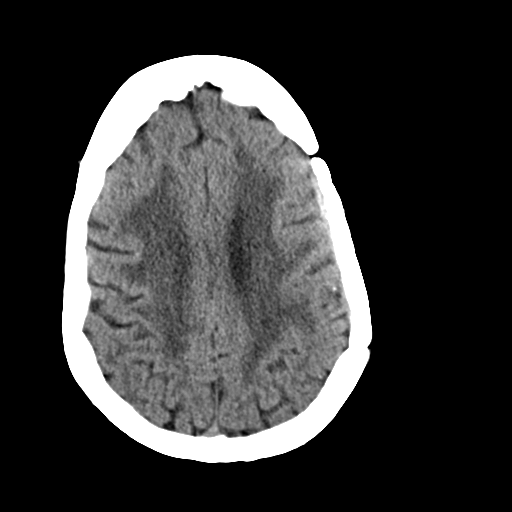
[im 25/35  brain]
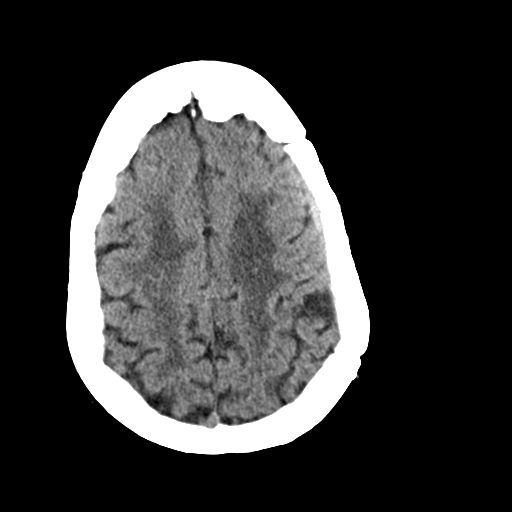
[im 26/35  brain]
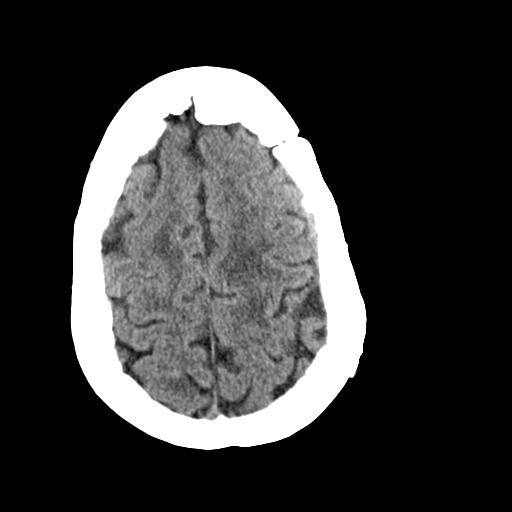
[im 26/35  bone]
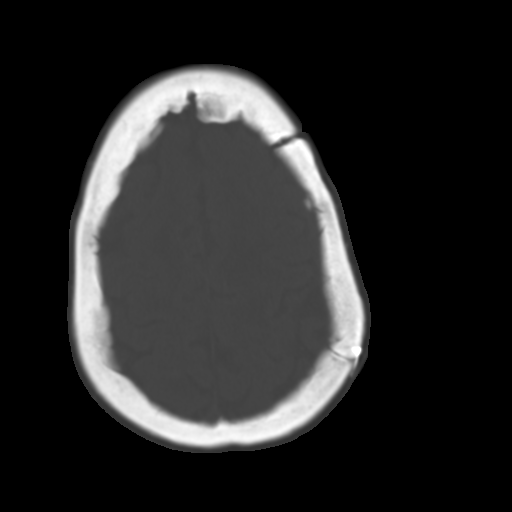
[im 29/35  brain]
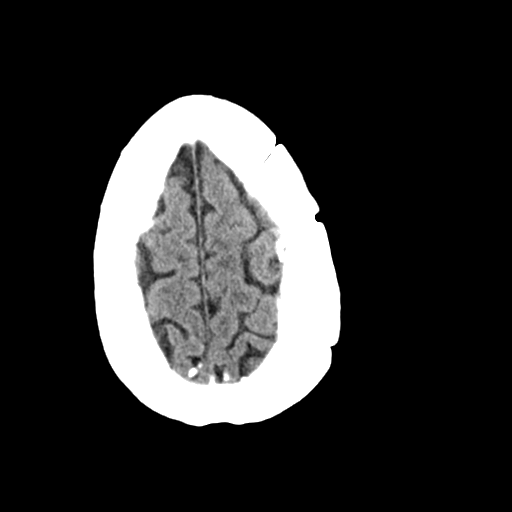
[im 31/35  brain]
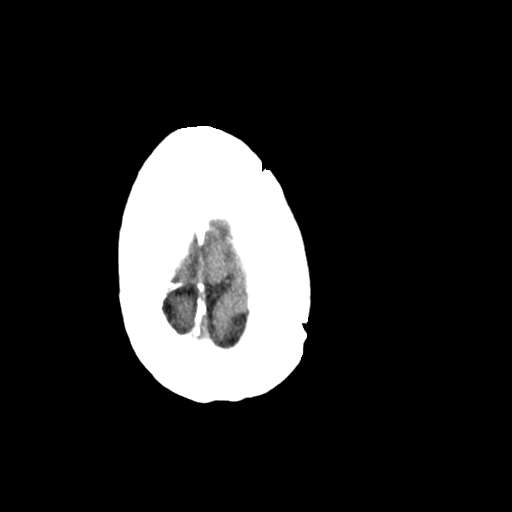
[im 33/35  brain]
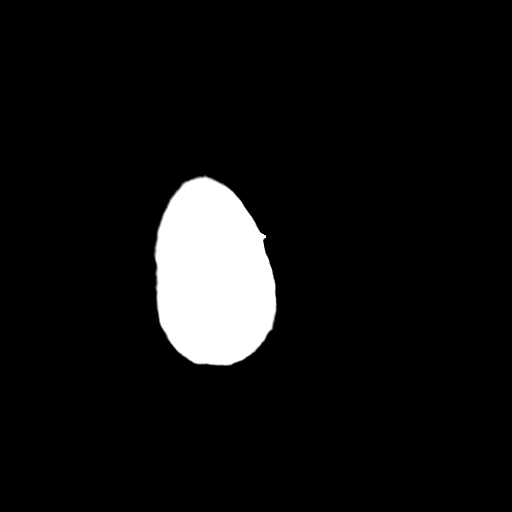

[16 of 30 positions shown; findings below may reference images not displayed]

FINDINGS: Brain: Interval complete resolution of posterior interhemispheric
subdural hematoma. No new area of hemorrhage. Left-sided craniotomy
with dural thickening on the left, unchanged.

Generalized atrophy. Extensive white matter hypodensity bilaterally
unchanged. No acute infarct.

Vascular: Negative for hyperdense vessel

Skull: Left-sided craniotomy with underlying dural thickening
unchanged.

Sinuses/Orbits: Mild mucosal edema paranasal sinuses. Bilateral
cataract extraction.

Other: None
IMPRESSION: Interval resolution of interhemispheric subdural hematoma. No new
hemorrhage

Advanced chronic microvascular ischemic change.

## 2021-02-08 NOTE — Progress Notes (Signed)
Carelink Summary Report / Loop Recorder 

## 2021-02-09 DIAGNOSIS — E559 Vitamin D deficiency, unspecified: Secondary | ICD-10-CM | POA: Diagnosis not present

## 2021-02-09 DIAGNOSIS — Z79899 Other long term (current) drug therapy: Secondary | ICD-10-CM | POA: Diagnosis not present

## 2021-02-09 DIAGNOSIS — E785 Hyperlipidemia, unspecified: Secondary | ICD-10-CM | POA: Diagnosis not present

## 2021-02-12 ENCOUNTER — Other Ambulatory Visit: Payer: PPO

## 2021-02-16 ENCOUNTER — Other Ambulatory Visit: Payer: Self-pay | Admitting: Cardiology

## 2021-02-20 ENCOUNTER — Ambulatory Visit (INDEPENDENT_AMBULATORY_CARE_PROVIDER_SITE_OTHER): Payer: PPO

## 2021-02-20 DIAGNOSIS — I10 Essential (primary) hypertension: Secondary | ICD-10-CM | POA: Insufficient documentation

## 2021-02-20 DIAGNOSIS — I639 Cerebral infarction, unspecified: Secondary | ICD-10-CM

## 2021-02-20 DIAGNOSIS — S065X9A Traumatic subdural hemorrhage with loss of consciousness of unspecified duration, initial encounter: Secondary | ICD-10-CM

## 2021-02-20 HISTORY — DX: Traumatic subdural hemorrhage with loss of consciousness of unspecified duration, initial encounter: S06.5X9A

## 2021-02-20 HISTORY — DX: Essential (primary) hypertension: I10

## 2021-02-21 DIAGNOSIS — D72829 Elevated white blood cell count, unspecified: Secondary | ICD-10-CM | POA: Diagnosis not present

## 2021-02-21 DIAGNOSIS — Z9181 History of falling: Secondary | ICD-10-CM | POA: Diagnosis not present

## 2021-02-21 DIAGNOSIS — J189 Pneumonia, unspecified organism: Secondary | ICD-10-CM | POA: Diagnosis not present

## 2021-02-21 DIAGNOSIS — Z951 Presence of aortocoronary bypass graft: Secondary | ICD-10-CM | POA: Diagnosis not present

## 2021-02-21 DIAGNOSIS — S065X0D Traumatic subdural hemorrhage without loss of consciousness, subsequent encounter: Secondary | ICD-10-CM | POA: Diagnosis not present

## 2021-02-21 DIAGNOSIS — E538 Deficiency of other specified B group vitamins: Secondary | ICD-10-CM | POA: Diagnosis not present

## 2021-02-21 DIAGNOSIS — J3489 Other specified disorders of nose and nasal sinuses: Secondary | ICD-10-CM | POA: Diagnosis not present

## 2021-02-21 DIAGNOSIS — D638 Anemia in other chronic diseases classified elsewhere: Secondary | ICD-10-CM | POA: Diagnosis not present

## 2021-02-21 DIAGNOSIS — J440 Chronic obstructive pulmonary disease with acute lower respiratory infection: Secondary | ICD-10-CM | POA: Diagnosis not present

## 2021-02-21 DIAGNOSIS — K219 Gastro-esophageal reflux disease without esophagitis: Secondary | ICD-10-CM | POA: Diagnosis not present

## 2021-02-21 DIAGNOSIS — I110 Hypertensive heart disease with heart failure: Secondary | ICD-10-CM | POA: Diagnosis not present

## 2021-02-21 DIAGNOSIS — G4733 Obstructive sleep apnea (adult) (pediatric): Secondary | ICD-10-CM | POA: Diagnosis not present

## 2021-02-21 DIAGNOSIS — I509 Heart failure, unspecified: Secondary | ICD-10-CM | POA: Diagnosis not present

## 2021-02-21 DIAGNOSIS — I2510 Atherosclerotic heart disease of native coronary artery without angina pectoris: Secondary | ICD-10-CM | POA: Diagnosis not present

## 2021-02-21 DIAGNOSIS — Z8673 Personal history of transient ischemic attack (TIA), and cerebral infarction without residual deficits: Secondary | ICD-10-CM | POA: Diagnosis not present

## 2021-02-21 DIAGNOSIS — D75839 Thrombocytosis, unspecified: Secondary | ICD-10-CM | POA: Diagnosis not present

## 2021-02-21 DIAGNOSIS — D696 Thrombocytopenia, unspecified: Secondary | ICD-10-CM | POA: Diagnosis not present

## 2021-02-21 DIAGNOSIS — E785 Hyperlipidemia, unspecified: Secondary | ICD-10-CM | POA: Diagnosis not present

## 2021-02-21 DIAGNOSIS — E559 Vitamin D deficiency, unspecified: Secondary | ICD-10-CM | POA: Diagnosis not present

## 2021-02-22 ENCOUNTER — Other Ambulatory Visit: Payer: Self-pay | Admitting: Cardiology

## 2021-02-23 ENCOUNTER — Telehealth: Payer: Self-pay | Admitting: Cardiology

## 2021-02-23 ENCOUNTER — Other Ambulatory Visit: Payer: Self-pay | Admitting: Cardiology

## 2021-02-23 MED ORDER — ASPIRIN EC 81 MG PO TBEC: 81.0000 mg | DELAYED_RELEASE_TABLET | Freq: Every day | ORAL | 3 refills | Status: AC

## 2021-02-23 NOTE — Telephone Encounter (Signed)
*  STAT* If patient is at the pharmacy, call can be transferred to refill team.   1. Which medications need to be refilled? (please list name of each medication and dose if known)  clopidogrel (PLAVIX) 75 MG tablet   2. Which pharmacy/location (including street and city if local pharmacy) is medication to be sent to? [id number] - [place], [place] [address]  3. Do they need a 30 day or 90 day supply? 90  Patient states she is having a hard time getting [facility] from [facility]

## 2021-02-23 NOTE — Addendum Note (Signed)
Addended by: Truddie Hidden on: 02/23/2021 03:02 PM   Modules accepted: Orders

## 2021-02-23 NOTE — Telephone Encounter (Signed)
Pt is only taking Plavix for her heart. Pt will stop Plavix and start aspirin 81 mg daily per Dr. Geraldo Pitter. Pt verbalized understanding and had no additional questions.

## 2021-02-23 NOTE — Telephone Encounter (Signed)
Left VM for pt to callback regarding her Plavix.

## 2021-02-26 ENCOUNTER — Telehealth: Payer: Self-pay

## 2021-02-26 NOTE — Telephone Encounter (Signed)
Patient called to get a sooner appointment so she can have some medication adjusted per her PCP. She was informed Dr. Julianne Rice is out of the office this week and upon his arrival he will review her recant labs from her PCP and make medication changes from that point. She verbalized understanding. When asked how she feels she said "weak as water" she states she was going to reach out to several of her doctor's to have check ups done. No further concerns expressed at this time.

## 2021-03-11 ENCOUNTER — Other Ambulatory Visit: Payer: Self-pay | Admitting: Cardiology

## 2021-03-13 NOTE — Progress Notes (Signed)
Carelink Summary Report / Loop Recorder 

## 2021-03-22 LAB — CUP PACEART REMOTE DEVICE CHECK
Date Time Interrogation Session: 20220801230110
Implantable Pulse Generator Implant Date: 20210413

## 2021-03-23 DIAGNOSIS — I2510 Atherosclerotic heart disease of native coronary artery without angina pectoris: Secondary | ICD-10-CM | POA: Insufficient documentation

## 2021-03-23 DIAGNOSIS — K219 Gastro-esophageal reflux disease without esophagitis: Secondary | ICD-10-CM | POA: Insufficient documentation

## 2021-03-26 ENCOUNTER — Other Ambulatory Visit: Payer: Self-pay

## 2021-03-26 ENCOUNTER — Ambulatory Visit (INDEPENDENT_AMBULATORY_CARE_PROVIDER_SITE_OTHER): Payer: PPO

## 2021-03-26 ENCOUNTER — Ambulatory Visit: Payer: PPO | Admitting: Cardiology

## 2021-03-26 ENCOUNTER — Encounter: Payer: Self-pay | Admitting: Cardiology

## 2021-03-26 VITALS — BP 126/74 | HR 66 | Ht 65.0 in | Wt 156.0 lb

## 2021-03-26 DIAGNOSIS — I10 Essential (primary) hypertension: Secondary | ICD-10-CM

## 2021-03-26 DIAGNOSIS — I2510 Atherosclerotic heart disease of native coronary artery without angina pectoris: Secondary | ICD-10-CM

## 2021-03-26 DIAGNOSIS — Z951 Presence of aortocoronary bypass graft: Secondary | ICD-10-CM | POA: Diagnosis not present

## 2021-03-26 DIAGNOSIS — E782 Mixed hyperlipidemia: Secondary | ICD-10-CM

## 2021-03-26 DIAGNOSIS — I639 Cerebral infarction, unspecified: Secondary | ICD-10-CM | POA: Diagnosis not present

## 2021-03-26 MED ORDER — ATORVASTATIN CALCIUM 20 MG PO TABS: 20.0000 mg | ORAL_TABLET | Freq: Every day | ORAL | 3 refills | Status: DC

## 2021-03-26 NOTE — Patient Instructions (Signed)
Medication Instructions:  Your physician has recommended you make the following change in your medication:   Decrease your Atorvastatin to 20 mg daily.   *If you need a refill on your cardiac medications before your next appointment, please call your pharmacy*   Lab Work: None ordered If you have labs (blood work) drawn today and your tests are completely normal, you will receive your results only by: [place] (if you have MyChart) OR A paper copy in the mail If you have any lab test that is abnormal or we need to change your treatment, we will call you to review the results.   Testing/Procedures: Your physician has requested that you have an echocardiogram. Echocardiography is a painless test that uses sound waves to create images of your heart. It provides your doctor with information about the size and shape of your heart and how well your heart's chambers and valves are working. This procedure takes approximately one hour. There are no restrictions for this procedure.    Follow-Up: At [facility], you and your health needs are our priority.  As part of our continuing mission to provide you with exceptional heart care, we have created designated Provider Care Teams.  These Care Teams include your primary Cardiologist (physician) and Advanced Practice Providers (APPs -  Physician Assistants and Nurse Practitioners) who all work together to provide you with the care you need, when you need it.  We recommend signing up for the patient portal called "MyChart".  Sign up information is provided on this After Visit Summary.  MyChart is used to connect with patients for Virtual Visits (Telemedicine).  Patients are able to view lab/test results, encounter notes, upcoming appointments, etc.  Non-urgent messages can be sent to your provider as well.   To learn more about what you can do with MyChart, go to [email or web address].    Your next appointment:   6 month(s)  The format for  your next appointment:   In Person  Provider:   Jyl Heinz, MD   Other Instructions Echocardiogram An echocardiogram is a test that uses sound waves (ultrasound) to produce images of the heart. Images from an echocardiogram can provide important information about: Heart size and shape. The size and thickness and movement of your heart's walls. Heart muscle function and strength. Heart valve function or if you have stenosis. Stenosis is when the heart valves are too narrow. If blood is flowing backward through the heart valves (regurgitation). A tumor or infectious growth around the heart valves. Areas of heart muscle that are not working well because of poor blood flow or injury from a heart attack. Aneurysm detection. An aneurysm is a weak or damaged part of an artery wall. The wall bulges out from the normal force of blood pumping through the body. Tell a health care provider about: Any allergies you have. All medicines you are taking, including vitamins, herbs, eye drops, creams, and over-the-counter medicines. Any blood disorders you have. Any surgeries you have had. Any medical conditions you have. Whether you are pregnant or may be pregnant. What are the risks? Generally, this is a safe test. However, problems may occur, including an allergic reaction to dye (contrast) that may be used during the test. What happens before the test? No specific preparation is needed. You may eat and drink normally. What happens during the test? You will take off your clothes from the waist up and put on a hospital gown. Electrodes or electrocardiogram (ECG)patches may be placed on your  chest. The electrodes or patches are then connected to a device that monitors your heart rate and rhythm. You will lie down on a table for an ultrasound exam. A gel will be applied to your chest to help sound waves pass through your skin. A handheld device, called a transducer, will be pressed against your  chest and moved over your heart. The transducer produces sound waves that travel to your heart and bounce back (or "echo" back) to the transducer. These sound waves will be captured in real-time and changed into images of your heart that can be viewed on a video monitor. The images will be recorded on a computer and reviewed by your health care provider. You may be asked to change positions or hold your breath for a short time. This makes it easier to get different views or better views of your heart. In some cases, you may receive contrast through an IV in one of your veins. This can improve the quality of the pictures from your heart. The procedure may vary among health care providers and hospitals.   What can I expect after the test? You may return to your normal, everyday life, including diet, activities, and medicines, unless your health care provider tells you not to do that. Follow these instructions at home: It is up to you to get the results of your test. Ask your health care provider, or the department that is doing the test, when your results will be ready. Keep all follow-up visits. This is important. Summary An echocardiogram is a test that uses sound waves (ultrasound) to produce images of the heart. Images from an echocardiogram can provide important information about the size and shape of your heart, heart muscle function, heart valve function, and other possible heart problems. You do not need to do anything to prepare before this test. You may eat and drink normally. After the echocardiogram is completed, you may return to your normal, everyday life, unless your health care provider tells you not to do that. This information is not intended to replace advice given to you by your health care provider. Make sure you discuss any questions you have with your health care provider. Document Revised: 03/28/2020 Document Reviewed: 03/28/2020 Elsevier Patient Education  2021 [employer].

## 2021-03-26 NOTE — Progress Notes (Signed)
Cardiology Office Note:    Date:  03/26/2021   ID:  Yolanda Flowers, DOB 1941/08/04, MRN [id number]  PCP:  [name], MD  Cardiologist:  Jenean Lindau, MD   Referring MD: [name], MD    ASSESSMENT:    1. Coronary artery disease involving native coronary artery of native heart without angina pectoris   2. Combined hyperlipidemia   3. Essential (primary) hypertension   4. Mixed hyperlipidemia   5. S/P CABG x 4    PLAN:    In order of problems listed above:  Coronary artery disease: Secondary prevention stressed with the patient.  Importance of compliance with diet medication stressed and she vocalized understanding.  She was advised to walk on a regular basis. Cardiac murmur: Echocardiogram will be done to assess murmur heard on auscultation. Mixed dyslipidemia: Lipids significantly reduced.  Her primary care has advised her to cut her atorvastatin from 40 mg to 20 a day.  I agree with that she will get a new prescription for this.  Lipids followed by primary care.  Diet emphasized. Essential hypertension: Blood pressure stable and diet was emphasized.  She was advised to walk on a regular basis to the best of her ability and she promises to do so. Patient will be seen in follow-up appointment in 6 months or earlier if the patient has any concerns    Medication Adjustments/Labs and Tests Ordered: Current medicines are reviewed at length with the patient today.  Concerns regarding medicines are outlined above.  No orders of the defined types were placed in this encounter.  No orders of the defined types were placed in this encounter.    No chief complaint on file.    History of Present Illness:    Yolanda Flowers is a 80 y.o. female.  Patient has past medical history of coronary artery disease, essential hypertension dyslipidemia.  She denies any problems at this time and takes care of activities of daily living.  No chest pain orthopnea or PND.  At the time of my  evaluation, the patient is alert awake oriented and in no distress.  Past Medical History:  Diagnosis Date   Abdominal pain    Accelerating angina ([place]) 08/26/2019   Acute blood loss anemia 12/14/2019   Acute cardioembolic stroke ([place]) [id number]   Allergic rhinitis 06/21/2014   Anxiety    Arthritis    Asthma    Atherosclerosis of arteries 08/24/2019   Bilateral edema of lower extremity    Body mass index (BMI) 25.0-25.9, adult 08/24/2019   Body mass index 29.0-29.9, adult 08/24/2019   CAD (coronary artery disease) 10/25/2019   Cellulitis of right leg    Chronic low back pain 01/27/2018   Combined hyperlipidemia [id number]   Complication of anesthesia    difficulty waking   Congestive heart failure (CHF) ([place])    COPD (chronic obstructive pulmonary disease) ([place])    Coronary artery disease    Coronary artery disease involving native heart without angina pectoris    Dyspnea 02/09/2019   Dyspnea on exertion    Embolic stroke ([place]) [id number]   Essential (primary) hypertension 02/20/2021   Essential thrombocythemia ([place]) 06/22/2014   Essential thrombocytosis ([place]) 06/22/2014   Fall at home, initial encounter 12/21/2020   Gastrointestinal bleeding 06/23/2018   GERD (gastroesophageal reflux disease)    GERD without esophagitis 06/21/2014   History of melanoma excision    left greast toe 2015   History of squamous cell carcinoma excision    face--  multiple excisions   History of subdural hematoma    2008   HTN (hypertension) 06/21/2014   Hypertension    Ischial bursitis, right 08/24/2019   Leucocytosis 12/14/2019   Leukocytosis 12/14/2019   Malignant melanoma of great toe ([place]) 08/24/2019   Melanoma in situ ([place]) 06/29/2014   Mixed hyperlipidemia 08/24/2019   Opioid dependence ([place]) 08/24/2019   OSA (obstructive sleep apnea)    intolerant   Pedal edema 08/24/2019   Pneumonia of both lungs due to infectious organism 12/20/2020   Renal insufficiency 08/24/2019   S/P CABG x 4 11/04/2019   Scoliosis (and  kyphoscoliosis), idiopathic 01/27/2018   Skin cancer 08/24/2019   Solitary pulmonary nodule on lung CT 02/09/2019   Squamous cell carcinoma, scalp/neck 08/24/2019   Stroke ([place]) 12/21/2020   Stroke due to embolism ([place]) 11/25/2019   Subdural hematoma ([facility]) 12/20/2020   Swelling of limb 08/24/2019   Thrombocytosis    takes hydoxyurea--  MONITORED BY DR Hinton Rao [facility])   VIN III (vulvar intraepithelial neoplasia III)    Vitamin B 12 deficiency 08/24/2019   Vitamin D deficiency 08/24/2019   Wears dentures    UPPER AND LOWER PARTIAL   Wears glasses     Past Surgical History:  Procedure Laterality Date   ABDOMINAL HYSTERECTOMY  age 21   CARDIAC CATHETERIZATION  03/172021   CORONARY ARTERY BYPASS GRAFT N/A 11/04/2019   Procedure: CORONARY ARTERY BYPASS GRAFTING (CABG) x 4, with ENDOSCOPIC HARVESTING OF RIGHT GREATER SAPHENOUS VEIN.;  Surgeon: Gaye Pollack, MD;  Location: [facility] OR;  Service: Open Heart Surgery;  Laterality: N/A;   INTRAVASCULAR ULTRASOUND/IVUS N/A 11/03/2019   Procedure: Intravascular Ultrasound/IVUS;  Surgeon: Nelva Bush, MD;  Location: [place] [facility] LAB;  Service: Cardiovascular;  Laterality: N/A;   KNEE ARTHROSCOPY Left 2004   LEFT HEART CATH AND CORONARY ANGIOGRAPHY N/A 11/03/2019   Procedure: LEFT HEART CATH AND CORONARY ANGIOGRAPHY;  Surgeon: Nelva Bush, MD;  Location: [place] [facility];  Service: Cardiovascular;  Laterality: N/A;   LOOP RECORDER INSERTION N/A 11/30/2019   Procedure: LOOP RECORDER INSERTION;  Surgeon: Thompson Grayer, MD;  Location: [place] [facility];  Service: Cardiovascular;  Laterality: N/A;   MELANOMA EXCISION  2015   left great toe   REPAIR PERONEAL TENDONS ANKLE  2004   SUBDURAL HEMATOMA EVACUATION VIA CRANIOTOMY  2008      week later  post-op  [facility]   TEE WITHOUT CARDIOVERSION N/A 11/04/2019   Procedure: TRANSESOPHAGEAL ECHOCARDIOGRAM (TEE);  Surgeon: Gaye Pollack, MD;  Location: [place];  Service: Open Heart Surgery;   Laterality: N/A;   VULVECTOMY N/A 10/24/2015   Procedure: WIDE LOCAL EXCISION OF THE VULVA ;  Surgeon: Everitt Amber, MD;  Location: [place];  Service: Gynecology;  Laterality: N/A;    Current Medications: Current Meds  Medication Sig   acetaminophen (TYLENOL) 325 MG tablet Take 1-2 tablets (325-650 mg total) by mouth every 4 (four) hours as needed for mild pain.   ammonium lactate (LAC-HYDRIN) 12 % lotion Apply 1 application topically as needed for dry skin.   Ascorbic Acid (VITAMIN C) 1000 MG tablet Take 1,000 mg by mouth daily.   aspirin EC 81 MG tablet Take 1 tablet (81 mg total) by mouth daily. Swallow whole.   atorvastatin (LIPITOR) 40 MG tablet Take 1 tablet (40 mg total) by mouth daily.   Calcium Carb-Cholecalciferol (CALCIUM 600 + D PO) Take 1 tablet by mouth in the morning and at bedtime.  fluticasone (FLONASE) 50 MCG/ACT nasal spray Place 2 sprays into both nostrils at bedtime.    Magnesium 250 MG TABS Take 250 mg by mouth at bedtime.    metoprolol tartrate (LOPRESSOR) 25 MG tablet TAKE 1 TABLET BY MOUTH TWICE A DAY   Multiple Vitamins-Minerals (PRESERVISION AREDS 2) CAPS Take 1 capsule by mouth in the morning and at bedtime.    pantoprazole (PROTONIX) 40 MG tablet TAKE 1 TABLET BY MOUTH EVERY DAY   polyethylene glycol (MIRALAX / GLYCOLAX) 17 g packet Take 17 g by mouth daily as needed for moderate constipation.   vitamin B-12 (CYANOCOBALAMIN) 500 MCG tablet Take 1 tablet (500 mcg total) by mouth daily.   Vitamin D, Ergocalciferol, (DRISDOL) 1.25 MG (50000 UT) CAPS capsule Take 50,000 Units by mouth every 14 (fourteen) days.     Allergies:   Cephalexin, Penicillin g, Ciprofloxacin, and Penicillins   Social History   Socioeconomic History   Marital status: Married    Spouse name: Not on file   Number of children: Not on file   Years of education: Not on file   Highest education level: Not on file  Occupational History   Not on file  Tobacco Use   Smoking  status: Never   Smokeless tobacco: Never  Vaping Use   Vaping Use: Never used  Substance and Sexual Activity   Alcohol use: No   Drug use: No   Sexual activity: Not Currently  Other Topics Concern   Not on file  Social History Narrative   Not on file   Social Determinants of Health   Financial Resource Strain: Not on file  Food Insecurity: Not on file  Transportation Needs: Not on file  Physical Activity: Not on file  Stress: Not on file  Social Connections: Not on file     Family History: The patient's family history includes Arthritis in her brother, father, mother, and sister; Asthma in her father; Clotting disorder in her mother; Emphysema in her father; Heart disease in her brother, father, mother, and sister; Hypertension in her brother, father, mother, and sister; Kidney failure in her father; Stroke in her father.  ROS:   Please see the history of present illness.    All other systems reviewed and are negative.  EKGs/Labs/Other Studies Reviewed:    The following studies were reviewed today: EKG reveals sinus rhythm with nonspecific ST-T changes   Recent Labs: 12/20/2020: B Natriuretic Peptide 400.9; TSH 1.211 12/21/2020: ALT 9 12/22/2020: Magnesium 2.3 12/28/2020: BUN 15; Creatinine, Ser 1.04; Hemoglobin 9.9; Platelets 133; Potassium 4.2; Sodium 137  Recent Lipid Panel    Component Value Date/Time   CHOL 74 12/21/2020 0236   CHOL 105 09/26/2020 0908   TRIG 104 12/21/2020 0236   HDL 28 (L) 12/21/2020 0236   HDL 33 (L) 09/26/2020 0908   CHOLHDL 2.6 12/21/2020 0236   VLDL 21 12/21/2020 0236   LDLCALC 25 12/21/2020 0236   LDLCALC 41 09/26/2020 0908    Physical Exam:    VS:  BP 126/74   Pulse 66   Ht '5\' 5"'$  (1.651 m)   Wt 156 lb (70.8 kg)   SpO2 96%   BMI 25.96 kg/m     Wt Readings from Last 3 Encounters:  03/26/21 156 lb (70.8 kg)  12/20/20 170 lb (77.1 kg)  12/18/20 159 lb 6.4 oz (72.3 kg)     GEN: Patient is in no acute distress HEENT:  Normal NECK: No JVD; No carotid bruits LYMPHATICS: No lymphadenopathy CARDIAC: Hear  sounds regular, 2/6 systolic murmur at the apex. RESPIRATORY:  Clear to auscultation without rales, wheezing or rhonchi  ABDOMEN: Soft, non-tender, non-distended MUSCULOSKELETAL:  No edema; No deformity  SKIN: Warm and dry NEUROLOGIC:  Alert and oriented x 3 PSYCHIATRIC:  Normal affect   Signed, Jenean Lindau, MD  03/26/2021 9:20 AM    [place]

## 2021-03-26 NOTE — Addendum Note (Signed)
Addended by: Truddie Hidden on: 03/26/2021 09:25 AM   Modules accepted: Orders

## 2021-03-28 ENCOUNTER — Inpatient Hospital Stay: Payer: PPO | Admitting: Neurology

## 2021-03-30 ENCOUNTER — Encounter: Payer: Self-pay | Admitting: Hematology and Oncology

## 2021-03-30 LAB — CBC AND DIFFERENTIAL
HCT: 34 — AB (ref 36–46)
Hemoglobin: 10.8 — AB (ref 12.0–16.0)
Neutrophils Absolute: 18.5
Platelets: 160 (ref 150–399)
WBC: 23.7

## 2021-03-30 LAB — BASIC METABOLIC PANEL
BUN: 19 (ref 4–21)
CO2: 21 (ref 13–22)
Chloride: 101 (ref 99–108)
Creatinine: 1.1 (ref 0.5–1.1)
Glucose: 103
Potassium: 4.4 (ref 3.4–5.3)
Sodium: 139 (ref 137–147)

## 2021-03-30 LAB — CBC: RBC: 3.61 — AB (ref 3.87–5.11)

## 2021-03-30 LAB — LIPID PANEL
Cholesterol: 95 (ref 0–200)
HDL: 32 — AB (ref 35–70)
LDL Cholesterol: 36
LDl/HDL Ratio: 1.1
Triglycerides: 160 (ref 40–160)

## 2021-03-30 LAB — HEPATIC FUNCTION PANEL
ALT: 8 (ref 7–35)
AST: 17 (ref 13–35)
Alkaline Phosphatase: 98 (ref 25–125)
Bilirubin, Total: 0.5

## 2021-03-30 LAB — COMPREHENSIVE METABOLIC PANEL
Albumin: 4.1 (ref 3.5–5.0)
Calcium: 8.6 — AB (ref 8.7–10.7)

## 2021-03-30 LAB — VITAMIN D 1,25 DIHYDROXY: VITAMIN D 25-HYDROXY: 63.9

## 2021-04-04 ENCOUNTER — Other Ambulatory Visit: Payer: PPO

## 2021-04-04 ENCOUNTER — Telehealth: Payer: Self-pay | Admitting: Oncology

## 2021-04-04 NOTE — Telephone Encounter (Signed)
Per 8/12 Staff Msg, patient scheduled for 8/22 Labs, Follow Up w/Dr Hinton Rao

## 2021-04-09 ENCOUNTER — Encounter: Payer: Self-pay | Admitting: Oncology

## 2021-04-09 ENCOUNTER — Other Ambulatory Visit: Payer: Self-pay | Admitting: Oncology

## 2021-04-09 ENCOUNTER — Telehealth: Payer: Self-pay | Admitting: Oncology

## 2021-04-09 ENCOUNTER — Other Ambulatory Visit: Payer: Self-pay

## 2021-04-09 ENCOUNTER — Inpatient Hospital Stay: Payer: PPO | Attending: Oncology

## 2021-04-09 ENCOUNTER — Inpatient Hospital Stay: Payer: PPO | Admitting: Oncology

## 2021-04-09 VITALS — BP 181/77 | HR 71 | Temp 98.4°F | Resp 18 | Ht 65.0 in | Wt 158.4 lb

## 2021-04-09 DIAGNOSIS — D473 Essential (hemorrhagic) thrombocythemia: Secondary | ICD-10-CM

## 2021-04-09 LAB — HEPATIC FUNCTION PANEL
ALT: 17 (ref 7–35)
AST: 29 (ref 13–35)
Alkaline Phosphatase: 82 (ref 25–125)
Bilirubin, Total: 0.4

## 2021-04-09 LAB — COMPREHENSIVE METABOLIC PANEL
Albumin: 4.2 (ref 3.5–5.0)
Calcium: 9 (ref 8.7–10.7)

## 2021-04-09 LAB — BASIC METABOLIC PANEL
BUN: 20 (ref 4–21)
CO2: 24 — AB (ref 13–22)
Chloride: 105 (ref 99–108)
Creatinine: 1.1 (ref 0.5–1.1)
Glucose: 107
Potassium: 4.5 (ref 3.4–5.3)
Sodium: 142 (ref 137–147)

## 2021-04-09 LAB — CBC AND DIFFERENTIAL
HCT: 35 — AB (ref 36–46)
Hemoglobin: 11.3 — AB (ref 12.0–16.0)
Neutrophils Absolute: 14.73
Platelets: 145 — AB (ref 150–399)
WBC: 19.9

## 2021-04-09 LAB — CBC: RBC: 3.68 — AB (ref 3.87–5.11)

## 2021-04-09 NOTE — Telephone Encounter (Signed)
Per 8/22 LOS, patient scheduled for Nov Appt's.  Gave patient Appt Summary

## 2021-04-09 NOTE — Progress Notes (Signed)
[facility]  [address] [place],  [place]  [address] [phone]  Clinic Day:  04/09/2021  Referring physician: [name], MD  This document serves as a record of services personally performed by Hosie Poisson, MD. It was created on their behalf by Curry,Lauren E, a trained medical scribe. The creation of this record is based on the scribe's personal observations and the provider's statements to them.  CHIEF COMPLAINT:  CC:   Essential thrombocytosis with fatigue  Current Treatment:   Observation   HISTORY OF PRESENT ILLNESS:  Yolanda Flowers is a 80 y.o. female with a history of essential thrombocythemia originally diagnosed in 31.  She was treated with hydroxyurea 500 mg 3 times daily.  We have had to occasionally adjust doses because of leukopenia or anemia.  She also had a malignant melanoma of her left first toe resected in November 2015 with partial amputation of the toe and has done well.  She has had multiple other skin cancers removed over the years.  In March 2017, she was seen by Dr. Denman George for HSIL/VIN 3 of the right posterior labia minora and underwent vulvectomy.  Hydroxyurea was decreased to 500 mg twice daily prior to surgery, as we did not feel we could safely hold hydroxyurea.  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  The patient had been seeing the gynecologist every 6 months, but has not continued regular follow-ups there. The platelet count increased in May 2017 to 657,000, so her dose of hydroxyurea was increased to 3 times daily, then subsequently decreased to twice a day due to leukopenia and anemia.  Her platelets had remained between under 500,000 since August 2018.  Bone density scan in March 2018 revealed osteopenia with a T-score of -1.6 in the femur, for which she is on calcium and vitamin-D.  She has had chronic back pain, which  is attributed to severe degenerative disease.  She had an MRI of the lumbar spine in March 2018, which revealed significant degenerative disc disease with mild spinal stenosis at L1 and L4, but moderate at L2-3.  Dr. Maryjean Ka has given her injections, as well at hydrocodone/APAP 5/325.  She has had intermittent leukocytosis since 2018, felt to be secondary to her essential thrombocythemia.   She was evaluated in the emergency department in October 2019 and had a GI bleed. CT abdomen and pelvis revealed acute uncomplicated diverticulitis of the sigmoid colon with no other acute abnormalities.  A moderate to large hiatal hernia was seen.  No focal hepatic lesions or intrahepatic biliary dilatation were seen.  She saw Dr. Geraldo Pitter and underwent nuclear med stress test in November, which did not reveal any evidence of inducible ischemia or other abnormality.  Left ventricular size and function was normal with an ejection fraction of 79%.  She states she was placed on Spiriva and Ventolin.  She underwent EGD and colonoscopy in December  2019, and no significant source of blood loss was identified.  Repeat iron studies, B12 and folate did not reveal any nutritional deficiency  She was instructed to continue lansoprazole.  She had dilatation of an esophageal stricture and removal of a polyp. Pathology revealed a fragmented tubular adenoma.    In January 2020, she had worsening anemia, in addition to continued episodes of chest pain and worsening dyspnea with exertion at her routine follow-up. Due to the severe dyspnea and right lower extremity edema, we obtained  a CT angiogram chest and venous Doppler ultrasound of the right lower extremity. CTA chest did not reveal any evidence of pulmonary embolism.  There was good opacification of the pulmonary arteries.  Diffuse coronary artery calcifications with cardiomegaly and moderate thoracic aortic atherosclerosis was seen.  There was a 3.6 mm noncalcified nodule in the  anterior inferior right upper lobe.  Noncontrast CT chest in 6-12 months was recommended.  Right lower extremity venous Doppler ultrasound did not reveal any evidence of deep venous thrombosis.  Further evaluation of the anemia did not reveal evidence of hemolysis or monoclonal gammopathy. In February 2020, she had persistent anemia with a hemoglobin of 9.7.  As she was symptomatic, hydroxyurea 500 mg was decreased to once daily.  Soluble transferrin was elevated, which was consistent with iron deficiency.  She saw Dr. Lyda Jester and he placed her on iron supplementation daily in the form of ferrous sulfate 65 mg. She was seen again in March and had improvement in her anemia with a hemoglobin of 10.2.  Her thrombocythemia remained controlled on hydroxyurea 500 mg daily. We encouraged her to have follow-up for her vulvar cancer, so followed up with Dr. Carlena Bjornstad and her exam was normal.   He did not feel he needed to check her again for 2 years unless she had problems.  Due to chronic dyspnea, she underwent multiple tests at [facility].  Apparently, she did not have evidence of COPD.  She was therefore referred to Cardiology.   She saw Dr. Geraldo Pitter and was undergoing cardiac evaluation when we saw her in January 2021.  She had a bilateral screening mammogram in February 2021, which revealed an area of calcifications in the right breast warranting further evaluation.  A diagnostic right mammogram was ordered, but this was delayed, because she underwent the open heart surgery with CABG x4 in March.  She had a monitor placed in the left chest wall the time of surgery.  She did not feel she can have mammogram due to the monitor in the left chest wall.  She then had 2 strokes following her surgery, from which she has recovered well, except for balance issues and left eye blindness.  She ambulates with a cane or walker depending on her circumstances.  She is on clopidogrel 75 mg daily, atorvastatin 80 mg daily,  metoprolol 25 mg twice daily and pantoprazole 40 mg daily.  She had a venous Doppler ultrasound of the right lower extremity in March for swelling postoperatively which did not reveal any evidence of deep venous thrombosis.  She had a CT chest in July to follow-up on the pulmonary nodule which were stable. She finally underwent diagnostic right mammogram from July which revealed likely benign 6 mm group of calcifications involving the upper inner quadrant at middle depth. Bilateral diagnostic mammogram in February 2022 was recommended    At her visit in October 2021, she reported elevated transaminases and purpura, which was attributed to atorvastatin.  Dr. Geraldo Pitter reduced the dose from '80mg'$  to '40mg'$  with normalization of the transaminases and no further purpura.  Her platelets were in low normal range, so hydroxyurea has been stopped since October 2021.  She continued to decline the COVID-19 vaccine due to her medical comorbidities.  Her platelet count has remained in a normal range since that time.  She does have occasional immature cells on her blood smear but not blasts.  INTERVAL HISTORY:  [place] is here for routine follow up and reports that she had CVA back in  May.  She was in the hospital and a rehabilitation home for about 6 weeks.  She still has some extremity weakness of the right hand.  She remains off of Hydroxyurea as advised and her platelet count remains normal at 145,000.  She states that she is now on oral B12 supplement.  She continues to follow with Dr. Geraldo Pitter and she is scheduled for an echocardiogram in the near future.  Her white count has decreased from 24.2 to 19.9 with an [place] of 1473, with 11% lymphocytes, 10% monocytes and 2.4 % basophils, hemoglobin has improved from 10.6 to 11.3, and platelet count is normal.  Chemistries are unremarkable except for a BUN of 20, and a creatinine of 1.1, previously 1.0.  Her  appetite is good, and she has lost 1 pound since her last visit.  She  denies fever, chills or other signs of infection.  She denies nausea, vomiting, bowel issues, or abdominal pain.  She denies sore throat, cough, dyspnea, or chest pain.  REVIEW OF SYSTEMS:  Review of Systems  Constitutional: Negative.  Negative for appetite change, chills, fatigue, fever and unexpected weight change.  HENT:  Negative.    Eyes: Negative.   Respiratory: Negative.  Negative for chest tightness, cough, hemoptysis, shortness of breath and wheezing.   Cardiovascular: Negative.  Negative for chest pain, leg swelling and palpitations.  Gastrointestinal: Negative.  Negative for abdominal distention, abdominal pain, blood in stool, constipation, diarrhea, nausea and vomiting.  Endocrine: Negative.   Genitourinary: Negative.  Negative for difficulty urinating, dysuria, frequency and hematuria.   Musculoskeletal:  Negative for arthralgias, back pain, flank pain, gait problem and myalgias.  Skin: Negative.   Neurological:  Positive for extremity weakness (of the right hand). Negative for dizziness, gait problem, headaches, light-headedness, numbness, seizures and speech difficulty.  Hematological: Negative.   Psychiatric/Behavioral: Negative.  Negative for depression and sleep disturbance. The patient is not nervous/anxious.     VITALS:  Blood pressure (!) 181/77, pulse 71, temperature 98.4 F (36.9 C), temperature source Oral, resp. rate 18, height '5\' 5"'$  (1.651 m), weight 158 lb 6.4 oz (71.8 kg), SpO2 95 %.  Wt Readings from Last 3 Encounters:  04/09/21 158 lb 6.4 oz (71.8 kg)  03/26/21 156 lb (70.8 kg)  12/20/20 170 lb (77.1 kg)    Body mass index is 26.36 kg/m.  Performance status (ECOG): 1 - Symptomatic but completely ambulatory  PHYSICAL EXAM:  Physical Exam Constitutional:      General: She is not in acute distress.    Appearance: Normal appearance. She is normal weight.  HENT:     Head: Normocephalic and atraumatic.  Eyes:     General: No scleral icterus.     Extraocular Movements: Extraocular movements intact.     Conjunctiva/sclera: Conjunctivae normal.     Pupils: Pupils are equal, round, and reactive to light.  Cardiovascular:     Rate and Rhythm: Normal rate and regular rhythm.     Pulses: Normal pulses.     Heart sounds: Normal heart sounds. No murmur heard.   No friction rub. No gallop.  Pulmonary:     Effort: Pulmonary effort is normal. No respiratory distress.     Breath sounds: Normal breath sounds.  Abdominal:     General: Bowel sounds are normal. There is no distension.     Palpations: Abdomen is soft. There is no hepatomegaly, splenomegaly or mass.     Tenderness: There is no abdominal tenderness.  Musculoskeletal:     Right  hand: Decreased strength.     Cervical back: Normal range of motion and neck supple.     Right lower leg: No edema.     Left lower leg: No edema.  Lymphadenopathy:     Cervical: No cervical adenopathy.  Skin:    General: Skin is warm and dry.  Neurological:     General: No focal deficit present.     Mental Status: She is alert and oriented to person, place, and time. Mental status is at baseline.     Motor: Weakness (of the right hand) present.  Psychiatric:        Mood and Affect: Mood normal.        Behavior: Behavior normal.        Thought Content: Thought content normal.        Judgment: Judgment normal.   LABS:   CBC Latest Ref Rng & Units 04/09/2021 03/30/2021 12/28/2020  WBC - 19.9 23.7 27.4(H)  Hemoglobin 12.0 - 16.0 11.3(A) 10.8(A) 9.9(L)  Hematocrit 36 - 46 35(A) 34(A) 31.3(L)  Platelets 150 - 399 145(A) 160 133(L)   CMP Latest Ref Rng & Units 04/09/2021 03/30/2021 12/28/2020  Glucose 70 - 99 mg/dL - - 117(H)  BUN 4 - '21 20 19 15  '$ Creatinine 0.5 - 1.1 1.1 1.1 1.04(H)  Sodium 137 - 147 142 139 137  Potassium 3.4 - 5.3 4.5 4.4 4.2  Chloride 99 - 108 105 101 107  CO2 13 - 22 24(A) 21 24  Calcium 8.7 - 10.7 9.0 8.6(A) 8.7(L)  Total Protein 6.5 - 8.1 g/dL - - -  Total Bilirubin 0.3 - 1.2  mg/dL - - -  Alkaline Phos 25 - 125 82 98 -  AST 13 - 35 29 17 -  ALT 7 - 35 17 8 -    No results found for: TOTALPROTELP, ALBUMINELP, A1GS, A2GS, BETS, BETA2SER, GAMS, MSPIKE, SPEI Lab Results  Component Value Date   TIBC 305 12/18/2020   FERRITIN 198 12/18/2020   IRONPCTSAT 15.4 12/18/2020   No results found for: LDH  STUDIES:  CUP PACEART REMOTE DEVICE CHECK  Result Date: 03/22/2021 ILR summary report received. Battery status OK. Normal device function. No new symptom, tachy, brady, or pause episodes. No new AF episodes. Monthly summary reports and ROV/PRN Kathy Breach, RN, CCDS, [employer]      HISTORY:   Allergies:  Allergies  Allergen Reactions   Cephalexin Hives   Penicillin G    Ciprofloxacin Other (See Comments)    Gi intolerance   Penicillins Rash    Childhood reaction Did it involve swelling of the face/tongue/throat, SOB, or low BP? No Did it involve sudden or severe rash/hives, skin peeling, or any reaction on the inside of your mouth or nose? No Did you need to seek medical attention at a hospital or doctor's office? No When did it last happen?      50 years ago If all above answers are "NO", may proceed with cephalosporin use.     Current Medications: Current Outpatient Medications  Medication Sig Dispense Refill   acetaminophen (TYLENOL) 325 MG tablet Take 1-2 tablets (325-650 mg total) by mouth every 4 (four) hours as needed for mild pain.     ammonium lactate (LAC-HYDRIN) 12 % lotion Apply 1 application topically as needed for dry skin.     Ascorbic Acid (VITAMIN C) 1000 MG tablet Take 1,000 mg by mouth daily.     aspirin EC 81 MG tablet Take 1  tablet (81 mg total) by mouth daily. Swallow whole. 90 tablet 3   atorvastatin (LIPITOR) 20 MG tablet Take 1 tablet (20 mg total) by mouth daily. 90 tablet 3   Calcium Carb-Cholecalciferol (CALCIUM 600 + D PO) Take 1 tablet by mouth in the morning and at bedtime.      fluticasone (FLONASE) 50 MCG/ACT  nasal spray Place 2 sprays into both nostrils at bedtime.      Magnesium 250 MG TABS Take 250 mg by mouth at bedtime.      metoprolol tartrate (LOPRESSOR) 25 MG tablet TAKE 1 TABLET BY MOUTH TWICE A DAY 180 tablet 3   Multiple Vitamins-Minerals (PRESERVISION AREDS 2) CAPS Take 1 capsule by mouth in the morning and at bedtime.      pantoprazole (PROTONIX) 40 MG tablet TAKE 1 TABLET BY MOUTH EVERY DAY 90 tablet 1   polyethylene glycol (MIRALAX / GLYCOLAX) 17 g packet Take 17 g by mouth daily as needed for moderate constipation. 14 each 0   vitamin B-12 (CYANOCOBALAMIN) 500 MCG tablet Take 1 tablet (500 mcg total) by mouth daily.     Vitamin D, Ergocalciferol, (DRISDOL) 1.25 MG (50000 UT) CAPS capsule Take 50,000 Units by mouth every 14 (fourteen) days.     No current facility-administered medications for this visit.     ASSESSMENT & PLAN:   Assessment: 1. Essential thrombocythemia previously treated with hydroxyurea.  Due to low normal platelets, hydroxyurea has been stopped since October 2021.  The platelets remain normal.  We watching her for any signs of transformation to myelofibrosis or acute leukemia.   2. Leukocytosis/neutrophilia, which she has had on and off since 2018.  This is felt to be due to her myeloproliferative disease, and has improved over the past few months.  We will continue to monitor.   3. Anemia, improved. Repeat nutritional studies in May revealed B12 to be low normal, so we had her start an oral B12 supplement.  Iron studies and folate were normal.    4. History of vulvar cancer, treated with surgical excision.   5. Right upper lobe nodule on CT chest seen.  Pulmonology is following, but CT imaging in July 2021 revealed stable pulmonary nodules.  6. History of melanoma of the great toe, treated with surgical excision.  She remains without evidence of recurrence   7. Chronic back pain, which is being managed with narcotics by Dr. Maryjean Ka.    8. Status post CABG  x4 in March 2021.   9. Status post CVA x2 in March and April 2021.  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   10. Calcifications in the right breast in March 2021 warranting further evaluation.  Unilateral diagnostic mammogram from July characterized these as likely benign. She is overdue for bilateral diagnostic mammogram at this time.  She would like to delay this for now.   11. Apparent iron deficiency, for which she has been on oral supplementation.   12.  CVA May 2022.  She has recovered but still has some extremity weakness of the right hand.   Plan:    She continues to do fairly well, but does have mild residual extremity weakness of the right hand from her CVA earlier this year.  We will plan to see her back in 3 months with repeat CBC, comprehensive metabolic panel and iron studies. The patient understands the plans discussed today and is in agreement with them.  ADDENDUM: Peripheral blood smear  was reviewed and does reveal 1% myelocytes and 1% metamyelocytes but no promyelocytes or blasts.   I, Rita Ohara, am acting as scribe for Derwood Kaplan, MD  I have reviewed this report as typed by the medical scribe, and it is complete and accurate.  Hosie Poisson, MD [place] at [facility]

## 2021-04-11 ENCOUNTER — Encounter: Payer: Self-pay | Admitting: Neurology

## 2021-04-11 ENCOUNTER — Ambulatory Visit: Payer: PPO | Admitting: Neurology

## 2021-04-11 VITALS — BP 139/88 | HR 62 | Ht 65.0 in | Wt 158.0 lb

## 2021-04-11 DIAGNOSIS — Z8673 Personal history of transient ischemic attack (TIA), and cerebral infarction without residual deficits: Secondary | ICD-10-CM

## 2021-04-11 DIAGNOSIS — S065X9A Traumatic subdural hemorrhage with loss of consciousness of unspecified duration, initial encounter: Secondary | ICD-10-CM

## 2021-04-11 DIAGNOSIS — S065XAA Traumatic subdural hemorrhage with loss of consciousness status unknown, initial encounter: Secondary | ICD-10-CM

## 2021-04-11 NOTE — Progress Notes (Signed)
[facility] [address] [place]. [place] [address] [phone]       OFFICE CONSULT NOTE  Yolanda Flowers Date of Birth:  06-27-1941 Medical Record Number:  [id number]   Referring MD: Rosalin Hawking  Reason for Referral: Subdural hemorrhage  HPI: [name] is a 80 year old pleasant Caucasian lady seen today for initial office consultation visit for subdural hemorrhage.  History is obtained from patient, review of electronic medical records and I personally reviewed pertinent imaging films in PACS.  She has past medical history of thrombocytosis, hypertension, subdural hematoma in 2008, coronary artery disease, COPD, CABG, bilateral cerebral and cerebral embolic infarcts in April 2021 of cryptogenic etiology and loop recorder placed and so far paroxysmal A. fib has not been found..  She is admitted in May 2022 with repeated falls and CT scan of the head showed falcine subdural hematoma and possibly subacute left thalamic stroke MRI was subsequently performed which did not show an acute stroke but also showed subacute looking scattered bilateral embolic strokes which are small.  CT scan chest abdomen pelvis was negative for malignancy.  LDL cholesterol was 25 mg percent.  Hemoglobin A1c was 5.8.  She was seen by therapy and recommended inpatient rehab.  Patient states she is done well.  Balance is still not recovered completely but she is doing better.  She is walking all right.  She is had no further falls.  She still has mild residual weakness in the right hand with no major deficits.  She is on aspirin 81 mg now and tolerating it well.  She was previously on Plavix but recently this was changed to aspirin by her cardiologist.  She has no new complaints today.  Her blood pressures well controlled today it is 139/88.  She is tolerating Lipitor well without muscle aches and pains.  She had a repeat CT scan of the head on 02/10/2020 which showed complete resolution of her falcine  subdural hematoma with only chronic changes and no acute abnormality. ROS:   14 system review of systems is positive for falls, weakness, bruising all other systems negative  PMH:  Past Medical History:  Diagnosis Date   Abdominal pain    Accelerating angina ([place]) 08/26/2019   Acute blood loss anemia 12/14/2019   Acute cardioembolic stroke ([place]) [id number]   Allergic rhinitis 06/21/2014   Anxiety    Arthritis    Asthma    Atherosclerosis of arteries 08/24/2019   Bilateral edema of lower extremity    Body mass index (BMI) 25.0-25.9, adult 08/24/2019   Body mass index 29.0-29.9, adult 08/24/2019   CAD (coronary artery disease) 10/25/2019   Cellulitis of right leg    Chronic low back pain 01/27/2018   Combined hyperlipidemia [id number]   Complication of anesthesia    difficulty waking   Congestive heart failure (CHF) ([place])    COPD (chronic obstructive pulmonary disease) ([place])    Coronary artery disease    Coronary artery disease involving native heart without angina pectoris    Dyspnea 02/09/2019   Dyspnea on exertion    Embolic stroke ([place]) [id number]   Essential (primary) hypertension 02/20/2021   Essential thrombocythemia ([place]) 06/22/2014   Essential thrombocytosis ([place]) 06/22/2014   Fall at home, initial encounter 12/21/2020   Gastrointestinal bleeding 06/23/2018   GERD (gastroesophageal reflux disease)    GERD without esophagitis 06/21/2014   History of melanoma excision    left greast toe 2015   History of squamous cell carcinoma excision  face--  multiple excisions   History of subdural hematoma    2008   HTN (hypertension) 06/21/2014   Hypertension    Ischial bursitis, right 08/24/2019   Leucocytosis 12/14/2019   Leukocytosis 12/14/2019   Malignant melanoma of great toe ([place]) 08/24/2019   Melanoma in situ ([place]) 06/29/2014   Mixed hyperlipidemia 08/24/2019   Opioid dependence ([place]) 08/24/2019   OSA (obstructive sleep apnea)    intolerant   Pedal edema 08/24/2019   Pneumonia of both lungs due  to infectious organism 12/20/2020   Renal insufficiency 08/24/2019   S/P CABG x 4 11/04/2019   Scoliosis (and kyphoscoliosis), idiopathic 01/27/2018   Skin cancer 08/24/2019   Solitary pulmonary nodule on lung CT 02/09/2019   Squamous cell carcinoma, scalp/neck 08/24/2019   Stroke ([place]) 12/21/2020   Stroke due to embolism ([place]) 11/25/2019   Subdural hematoma ([facility]) 12/20/2020   Swelling of limb 08/24/2019   Thrombocytosis    takes hydoxyurea--  MONITORED BY DR Hinton Rao ([place])   VIN III (vulvar intraepithelial neoplasia III)    Vitamin B 12 deficiency 08/24/2019   Vitamin D deficiency 08/24/2019   Wears dentures    UPPER AND LOWER PARTIAL   Wears glasses     Social History:  Social History   Socioeconomic History   Marital status: Married    Spouse name: Not on file   Number of children: Not on file   Years of education: Not on file   Highest education level: Not on file  Occupational History   Not on file  Tobacco Use   Smoking status: Never   Smokeless tobacco: Never  Vaping Use   Vaping Use: Never used  Substance and Sexual Activity   Alcohol use: No   Drug use: No   Sexual activity: Not Currently  Other Topics Concern   Not on file  Social History Narrative   Not on file   Social Determinants of Health   Financial Resource Strain: Not on file  Food Insecurity: Not on file  Transportation Needs: Not on file  Physical Activity: Not on file  Stress: Not on file  Social Connections: Not on file  Intimate Partner Violence: Not on file    Medications:   Current Outpatient Medications on File Prior to Visit  Medication Sig Dispense Refill   acetaminophen (TYLENOL) 325 MG tablet Take 1-2 tablets (325-650 mg total) by mouth every 4 (four) hours as needed for mild pain.     ammonium lactate (LAC-HYDRIN) 12 % lotion Apply 1 application topically as needed for dry skin.     Ascorbic Acid (VITAMIN C) 1000 MG tablet Take 1,000 mg by mouth daily.     aspirin EC 81 MG  tablet Take 1 tablet (81 mg total) by mouth daily. Swallow whole. 90 tablet 3   atorvastatin (LIPITOR) 20 MG tablet Take 1 tablet (20 mg total) by mouth daily. 90 tablet 3   Calcium Carb-Cholecalciferol (CALCIUM 600 + D PO) Take 1 tablet by mouth in the morning and at bedtime.      fluticasone (FLONASE) 50 MCG/ACT nasal spray Place 2 sprays into both nostrils at bedtime.      Magnesium 250 MG TABS Take 250 mg by mouth at bedtime.      metoprolol tartrate (LOPRESSOR) 25 MG tablet TAKE 1 TABLET BY MOUTH TWICE A DAY 180 tablet 3   Multiple Vitamins-Minerals (PRESERVISION AREDS 2) CAPS Take 1 capsule by mouth in the morning and at bedtime.  pantoprazole (PROTONIX) 40 MG tablet TAKE 1 TABLET BY MOUTH EVERY DAY 90 tablet 1   polyethylene glycol (MIRALAX / GLYCOLAX) 17 g packet Take 17 g by mouth daily as needed for moderate constipation. 14 each 0   vitamin B-12 (CYANOCOBALAMIN) 500 MCG tablet Take 1 tablet (500 mcg total) by mouth daily.     Vitamin D, Ergocalciferol, (DRISDOL) 1.25 MG (50000 UT) CAPS capsule Take 50,000 Units by mouth every 14 (fourteen) days.     No current facility-administered medications on file prior to visit.    Allergies:   Allergies  Allergen Reactions   Cephalexin Hives   Penicillin G    Ciprofloxacin Other (See Comments)    Gi intolerance   Penicillins Rash    Childhood reaction Did it involve swelling of the face/tongue/throat, SOB, or low BP? No Did it involve sudden or severe rash/hives, skin peeling, or any reaction on the inside of your mouth or nose? No Did you need to seek medical attention at a hospital or doctor's office? No When did it last happen?      50 years ago If all above answers are "NO", may proceed with cephalosporin use.     Physical Exam General: well developed, well nourished elderly Caucasian lady, seated, in no evident distress Head: head normocephalic and atraumatic.   Neck: supple with no carotid or supraclavicular  bruits Cardiovascular: regular rate and rhythm, no murmurs Musculoskeletal: Moderate kyphosis Skin:  no rash/petichiae Vascular:  Normal pulses all extremities  Neurologic Exam Mental Status: Awake and fully alert. Oriented to place and time. Recent and remote memory intact. Attention span, concentration and fund of knowledge appropriate. Mood and affect appropriate.  Diminished recall 2/3.  Able to name only 5 animals which can walk on 4 legs.  Clock drawing 4/4. Cranial Nerves: Fundoscopic exam reveals sharp disc margins. Pupils equal, briskly reactive to light. Extraocular movements full without nystagmus. Visual fields full to confrontation. Hearing slightly diminished bilaterally facial sensation intact. Face, tongue, palate moves normally and symmetrically.  Motor: Normal bulk and tone. Normal strength in all tested extremity muscles.  Slight diminished fine finger movements on the right. Sensory.: intact to touch , pinprick , position and vibratory sensation.  Coordination: Rapid alternating movements normal in all extremities. Finger-to-nose and heel-to-shin performed accurately bilaterally. Gait and Station: Arises from chair without difficulty. Stance is normal. Gait demonstrates normal stride length and balance . Able to heel, toe and tandem walk with heart rate difficulty.  Reflexes: 1+ and symmetric. Toes downgoing.   NIHSS  1 Modified Rankin  2   ASSESSMENT: 80 year old Caucasian lady with small acute on chronic traumatic small subdural hemorrhage in April 2021 as well as multiple small cerebral and cerebellar embolic infarcts from cryptogenic source.  In April 2021 as well as in May 2022 negative extensive work-up       PLAN: I had a long discussion with the patient and her husband regarding her recent subdural hemorrhage related to fall as well as prior history of TIAs and strokes.  I recommend she continue aspirin 81 mg daily and given her history of thrombocytosis and  bleeding and fall risk I do not think more aggressive stroke evaluation or stronger blood thinners are not indicated.  Continue aggressive risk factor modification with strict control of hypertension blood pressure goal below 130/90, lipids with LDL cholesterol goal below 70 mg percent and diabetes with hemoglobin A1c goal below 6.5%.  She was advised to use her cane while walking outdoors  and on uneven surfaces and we discussed fall prevention precautions.  She will return for follow-up in the future only as necessary and knows routine schedule appointment was made.  Greater than 50% time during this 45-minute consultation visit was spent on counseling and coordination of care about her subdural hemorrhage and history of TIA and stroke and answering questions. Yolanda Contras, MD Note: This document was prepared with digital dictation and possible smart phrase technology. Any transcriptional errors that result from this process are unintentional.

## 2021-04-11 NOTE — Patient Instructions (Signed)
I had a long discussion with the patient and her husband regarding her recent subdural hemorrhage related to fall as well as prior history of TIAs and strokes.  I recommend she continue aspirin 81 mg daily and given her history of thrombocytosis and bleeding and fall risk I do not think more aggressive stroke evaluation or stronger blood thinners are indicated.  Continue aggressive risk factor modification with strict control of hypertension blood pressure goal below 130/90, lipids with LDL cholesterol goal below 70 mg percent and diabetes with hemoglobin A1c goal below 6.5%.  She was advised to use her cane while walking outdoors and on uneven surfaces and we discussed fall prevention precautions.  She will return for follow-up in the future only as necessary and knows routine schedule appointment was made. Fall Prevention in the Home, Adult Falls can cause injuries and can happen to people of all ages. There are many things you can do to make your home safe and to help prevent falls. Ask forhelp when making these changes. What actions can I take to prevent falls? General Instructions Use good lighting in all rooms. Replace any light bulbs that burn out. Turn on the lights in dark areas. Use night-lights. Keep items that you use often in easy-to-reach places. Lower the shelves around your home if needed. Set up your furniture so you have a clear path. Avoid moving your furniture around. Do not have throw rugs or other things on the floor that can make you trip. Avoid walking on wet floors. If any of your floors are uneven, fix them. Add color or contrast paint or tape to clearly mark and help you see: Grab bars or handrails. First and last steps of staircases. Where the edge of each step is. If you use a stepladder: Make sure that it is fully opened. Do not climb a closed stepladder. Make sure the sides of the stepladder are locked in place. Ask someone to hold the stepladder while you use  it. Know where your pets are when moving through your home. What can I do in the bathroom?     Keep the floor dry. Clean up any water on the floor right away. Remove soap buildup in the tub or shower. Use nonskid mats or decals on the floor of the tub or shower. Attach bath mats securely with double-sided, nonslip rug tape. If you need to sit down in the shower, use a plastic, nonslip stool. Install grab bars by the toilet and in the tub and shower. Do not use towel bars as grab bars. What can I do in the bedroom? Make sure that you have a light by your bed that is easy to reach. Do not use any sheets or blankets for your bed that hang to the floor. Have a firm chair with side arms that you can use for support when you get dressed. What can I do in the kitchen? Clean up any spills right away. If you need to reach something above you, use a step stool with a grab bar. Keep electrical cords out of the way. Do not use floor polish or wax that makes floors slippery. What can I do with my stairs? Do not leave any items on the stairs. Make sure that you have a light switch at the top and the bottom of the stairs. Make sure that there are handrails on both sides of the stairs. Fix handrails that are broken or loose. Install nonslip stair treads on all your stairs. Avoid  having throw rugs at the top or bottom of the stairs. Choose a carpet that does not hide the edge of the steps on the stairs. Check carpeting to make sure that it is firmly attached to the stairs. Fix carpet that is loose or worn. What can I do on the outside of my home? Use bright outdoor lighting. Fix the edges of walkways and driveways and fix any cracks. Remove anything that might make you trip as you walk through a door, such as a raised step or threshold. Trim any bushes or trees on paths to your home. Check to see if handrails are loose or broken and that both sides of all steps have handrails. Install guardrails  along the edges of any raised decks and porches. Clear paths of anything that can make you trip, such as tools or rocks. Have leaves, snow, or ice cleared regularly. Use sand or salt on paths during winter. Clean up any spills in your garage right away. This includes grease or oil spills. What other actions can I take? Wear shoes that: Have a low heel. Do not wear high heels. Have rubber bottoms. Feel good on your feet and fit well. Are closed at the toe. Do not wear open-toe sandals. Use tools that help you move around if needed. These include: Canes. Walkers. Scooters. Crutches. Review your medicines with your doctor. Some medicines can make you feel dizzy. This can increase your chance of falling. Ask your doctor what else you can do to help prevent falls. Where to find more information Centers for Disease Control and Prevention, STEADI: http://www.wolf.info/ National Institute on Aging: http://kim-miller.com/ Contact a doctor if: You are afraid of falling at home. You feel weak, drowsy, or dizzy at home. You fall at home. Summary There are many simple things that you can do to make your home safe and to help prevent falls. Ways to make your home safe include removing things that can make you trip and installing grab bars in the bathroom. Ask for help when making these changes in your home. This information is not intended to replace advice given to you by your health care provider. Make sure you discuss any questions you have with your healthcare provider. Document Revised: 03/08/2020 Document Reviewed: 03/08/2020 Elsevier Patient Education  [place]. Stroke Prevention Some medical conditions and behaviors are associated with a higher chance of having a stroke. You can help prevent a stroke by making nutrition, lifestyle,and other changes, including managing any medical conditions you may have. What nutrition changes can be made?  Eat healthy foods. You can do this by: Choosing  foods high in fiber, such as fresh fruits and vegetables and whole grains. Eating at least 5 or more servings of fruits and vegetables a day. Try to fill half of your plate at each meal with fruits and vegetables. Choosing lean protein foods, such as lean cuts of meat, poultry without skin, fish, tofu, beans, and nuts. Eating low-fat dairy products. Avoiding foods that are high in salt (sodium). This can help lower blood pressure. Avoiding foods that have saturated fat, trans fat, and cholesterol. This can help prevent high cholesterol. Avoiding processed and premade foods. Follow your health care provider's specific guidelines for losing weight, controlling high blood pressure (hypertension), lowering high cholesterol, and managing diabetes. These may include: Reducing your daily calorie intake. Limiting your daily sodium intake to 1,500 milligrams (mg). Using only healthy fats for cooking, such as olive oil, canola oil, or sunflower oil. Counting your  daily carbohydrate intake. What lifestyle changes can be made? Maintain a healthy weight. Talk to your health care provider about your ideal weight. Get at least 30 minutes of moderate physical activity at least 5 days a week. Moderate activity includes brisk walking, biking, and swimming. Do not use any products that contain nicotine or tobacco, such as cigarettes and e-cigarettes. If you need help quitting, ask your health care provider. It may also be helpful to avoid exposure to secondhand smoke. Limit alcohol intake to no more than 1 drink a day for nonpregnant women and 2 drinks a day for men. One drink equals 12 oz of beer, 5 oz of wine, or 1 oz of hard liquor. Stop any illegal drug use. Avoid taking birth control pills. Talk to your health care provider about the risks of taking birth control pills if: You are over 7 years old. You smoke. You get migraines. You have ever had a blood clot. What other changes can be made? Manage your  cholesterol levels. Eating a healthy diet is important for preventing high cholesterol. If cholesterol cannot be managed through diet alone, you may also need to take medicines. Take any prescribed medicines to control your cholesterol as told by your health care provider. Manage your diabetes. Eating a healthy diet and exercising regularly are important parts of managing your blood sugar. If your blood sugar cannot be managed through diet and exercise, you may need to take medicines. Take any prescribed medicines to control your diabetes as told by your health care provider. Control your hypertension. To reduce your risk of stroke, try to keep your blood pressure below 130/80. Eating a healthy diet and exercising regularly are an important part of controlling your blood pressure. If your blood pressure cannot be managed through diet and exercise, you may need to take medicines. Take any prescribed medicines to control hypertension as told by your health care provider. Ask your health care provider if you should monitor your blood pressure at home. Have your blood pressure checked every year, even if your blood pressure is normal. Blood pressure increases with age and some medical conditions. Get evaluated for sleep disorders (sleep apnea). Talk to your health care provider about getting a sleep evaluation if you snore a lot or have excessive sleepiness. Take over-the-counter and prescription medicines only as told by your health care provider. Aspirin or blood thinners (antiplatelets or anticoagulants) may be recommended to reduce your risk of forming blood clots that can lead to stroke. Make sure that any other medical conditions you have, such as atrial fibrillation or atherosclerosis, are managed. What are the warning signs of a stroke? The warning signs of a stroke can be easily remembered as BEFAST. B is for balance. Signs include: Dizziness. Loss of balance or coordination. Sudden trouble  walking. E is for eyes. Signs include: A sudden change in vision. Trouble seeing. F is for face. Signs include: Sudden weakness or numbness of the face. The face or eyelid drooping to one side. A is for arms. Signs include: Sudden weakness or numbness of the arm, usually on one side of the body. S is for speech. Signs include: Trouble speaking (aphasia). Trouble understanding. T is for time. These symptoms may represent a serious problem that is an emergency. Do not wait to see if the symptoms will go away. Get medical help right away. Call your local emergency services (911 in the [place] Do not drive yourself to the hospital. Other signs of stroke may include: A  sudden, severe headache with no known cause. Nausea or vomiting. Seizure. Where to find more information For more information, visit: American Stroke Association: www.strokeassociation.org National Stroke Association: www.stroke.org Summary You can prevent a stroke by eating healthy, exercising, not smoking, limiting alcohol intake, and managing any medical conditions you may have. Do not use any products that contain nicotine or tobacco, such as cigarettes and e-cigarettes. If you need help quitting, ask your health care provider. It may also be helpful to avoid exposure to secondhand smoke. Remember BEFAST for warning signs of stroke. Get help right away if you or a loved one has any of these signs. This information is not intended to replace advice given to you by your health care provider. Make sure you discuss any questions you have with your healthcare provider. Document Revised: 07/18/2017 Document Reviewed: 09/10/2016 Elsevier Patient Education  2021 [employer].

## 2021-04-13 ENCOUNTER — Other Ambulatory Visit: Payer: Self-pay

## 2021-04-13 ENCOUNTER — Ambulatory Visit (INDEPENDENT_AMBULATORY_CARE_PROVIDER_SITE_OTHER): Payer: PPO

## 2021-04-13 DIAGNOSIS — I2510 Atherosclerotic heart disease of native coronary artery without angina pectoris: Secondary | ICD-10-CM | POA: Diagnosis not present

## 2021-04-15 LAB — ECHOCARDIOGRAM COMPLETE
Area-P 1/2: 2.02 cm2
S' Lateral: 2.4 cm

## 2021-04-17 ENCOUNTER — Telehealth: Payer: Self-pay | Admitting: Cardiology

## 2021-04-17 NOTE — Telephone Encounter (Signed)
° ° °  Pt is calling to get echo result °

## 2021-04-18 NOTE — Telephone Encounter (Signed)
Results reviewed with pt as per Dr. Revankar's note.  Pt verbalized understanding and had no additional questions. Routed to PCP.  

## 2021-04-19 NOTE — Progress Notes (Signed)
Carelink Summary Report / Loop Recorder 

## 2021-04-30 ENCOUNTER — Ambulatory Visit (INDEPENDENT_AMBULATORY_CARE_PROVIDER_SITE_OTHER): Payer: PPO

## 2021-04-30 DIAGNOSIS — I6349 Cerebral infarction due to embolism of other cerebral artery: Secondary | ICD-10-CM | POA: Diagnosis not present

## 2021-05-03 LAB — CUP PACEART REMOTE DEVICE CHECK
Date Time Interrogation Session: 20220903230836
Implantable Pulse Generator Implant Date: 20210413

## 2021-05-09 DIAGNOSIS — Z6825 Body mass index (BMI) 25.0-25.9, adult: Secondary | ICD-10-CM | POA: Diagnosis not present

## 2021-05-09 DIAGNOSIS — M545 Low back pain, unspecified: Secondary | ICD-10-CM | POA: Diagnosis not present

## 2021-05-09 DIAGNOSIS — Z79899 Other long term (current) drug therapy: Secondary | ICD-10-CM | POA: Diagnosis not present

## 2021-05-09 DIAGNOSIS — R600 Localized edema: Secondary | ICD-10-CM | POA: Diagnosis not present

## 2021-05-09 DIAGNOSIS — J45909 Unspecified asthma, uncomplicated: Secondary | ICD-10-CM | POA: Diagnosis not present

## 2021-05-09 DIAGNOSIS — E559 Vitamin D deficiency, unspecified: Secondary | ICD-10-CM | POA: Diagnosis not present

## 2021-05-09 DIAGNOSIS — K219 Gastro-esophageal reflux disease without esophagitis: Secondary | ICD-10-CM | POA: Diagnosis not present

## 2021-05-09 DIAGNOSIS — E663 Overweight: Secondary | ICD-10-CM | POA: Diagnosis not present

## 2021-05-09 DIAGNOSIS — I5189 Other ill-defined heart diseases: Secondary | ICD-10-CM | POA: Diagnosis not present

## 2021-05-09 DIAGNOSIS — I10 Essential (primary) hypertension: Secondary | ICD-10-CM | POA: Diagnosis not present

## 2021-05-09 DIAGNOSIS — N3281 Overactive bladder: Secondary | ICD-10-CM | POA: Diagnosis not present

## 2021-05-09 DIAGNOSIS — J449 Chronic obstructive pulmonary disease, unspecified: Secondary | ICD-10-CM | POA: Diagnosis not present

## 2021-05-09 NOTE — Progress Notes (Signed)
Carelink Summary Report / Loop Recorder 

## 2021-06-01 DIAGNOSIS — Z23 Encounter for immunization: Secondary | ICD-10-CM | POA: Diagnosis not present

## 2021-06-04 ENCOUNTER — Ambulatory Visit (INDEPENDENT_AMBULATORY_CARE_PROVIDER_SITE_OTHER): Payer: PPO

## 2021-06-04 DIAGNOSIS — I6349 Cerebral infarction due to embolism of other cerebral artery: Secondary | ICD-10-CM

## 2021-06-06 LAB — CUP PACEART REMOTE DEVICE CHECK
Date Time Interrogation Session: 20221007000335
Implantable Pulse Generator Implant Date: 20210413

## 2021-06-13 NOTE — Progress Notes (Signed)
Carelink Summary Report / Loop Recorder 

## 2021-07-09 ENCOUNTER — Ambulatory Visit (INDEPENDENT_AMBULATORY_CARE_PROVIDER_SITE_OTHER): Payer: PPO

## 2021-07-09 DIAGNOSIS — I6349 Cerebral infarction due to embolism of other cerebral artery: Secondary | ICD-10-CM

## 2021-07-10 ENCOUNTER — Encounter: Payer: Self-pay | Admitting: Hematology and Oncology

## 2021-07-10 ENCOUNTER — Inpatient Hospital Stay: Payer: PPO | Attending: Hematology and Oncology

## 2021-07-10 ENCOUNTER — Inpatient Hospital Stay (INDEPENDENT_AMBULATORY_CARE_PROVIDER_SITE_OTHER): Payer: PPO | Admitting: Hematology and Oncology

## 2021-07-10 DIAGNOSIS — Z8673 Personal history of transient ischemic attack (TIA), and cerebral infarction without residual deficits: Secondary | ICD-10-CM | POA: Diagnosis not present

## 2021-07-10 DIAGNOSIS — D473 Essential (hemorrhagic) thrombocythemia: Secondary | ICD-10-CM | POA: Insufficient documentation

## 2021-07-10 DIAGNOSIS — G4733 Obstructive sleep apnea (adult) (pediatric): Secondary | ICD-10-CM | POA: Diagnosis not present

## 2021-07-10 DIAGNOSIS — I2510 Atherosclerotic heart disease of native coronary artery without angina pectoris: Secondary | ICD-10-CM | POA: Insufficient documentation

## 2021-07-10 DIAGNOSIS — K449 Diaphragmatic hernia without obstruction or gangrene: Secondary | ICD-10-CM | POA: Insufficient documentation

## 2021-07-10 DIAGNOSIS — J449 Chronic obstructive pulmonary disease, unspecified: Secondary | ICD-10-CM | POA: Diagnosis not present

## 2021-07-10 DIAGNOSIS — Z8589 Personal history of malignant neoplasm of other organs and systems: Secondary | ICD-10-CM | POA: Diagnosis not present

## 2021-07-10 DIAGNOSIS — D72829 Elevated white blood cell count, unspecified: Secondary | ICD-10-CM | POA: Diagnosis not present

## 2021-07-10 DIAGNOSIS — Z85820 Personal history of malignant melanoma of skin: Secondary | ICD-10-CM | POA: Insufficient documentation

## 2021-07-10 DIAGNOSIS — K219 Gastro-esophageal reflux disease without esophagitis: Secondary | ICD-10-CM | POA: Diagnosis not present

## 2021-07-10 DIAGNOSIS — E785 Hyperlipidemia, unspecified: Secondary | ICD-10-CM | POA: Diagnosis not present

## 2021-07-10 DIAGNOSIS — E782 Mixed hyperlipidemia: Secondary | ICD-10-CM | POA: Insufficient documentation

## 2021-07-10 DIAGNOSIS — I110 Hypertensive heart disease with heart failure: Secondary | ICD-10-CM | POA: Insufficient documentation

## 2021-07-10 LAB — COMPREHENSIVE METABOLIC PANEL
Albumin: 4.4 (ref 3.5–5.0)
Calcium: 9 (ref 8.7–10.7)

## 2021-07-10 LAB — BASIC METABOLIC PANEL
BUN: 17 (ref 4–21)
CO2: 27 — AB (ref 13–22)
Chloride: 109 — AB (ref 99–108)
Creatinine: 1.1 (ref 0.5–1.1)
Glucose: 99
Potassium: 4.4 (ref 3.4–5.3)
Sodium: 144 (ref 137–147)

## 2021-07-10 LAB — CUP PACEART REMOTE DEVICE CHECK
Date Time Interrogation Session: 20221120231324
Implantable Pulse Generator Implant Date: 20210413

## 2021-07-10 LAB — FERRITIN: Ferritin: 59 ng/mL (ref 11–307)

## 2021-07-10 LAB — CBC AND DIFFERENTIAL
HCT: 35 — AB (ref 36–46)
Hemoglobin: 11.4 — AB (ref 12.0–16.0)
Neutrophils Absolute: 15.52
Platelets: 135 — AB (ref 150–399)
WBC: 19.4

## 2021-07-10 LAB — HEPATIC FUNCTION PANEL
ALT: 23 (ref 7–35)
AST: 31 (ref 13–35)
Alkaline Phosphatase: 78 (ref 25–125)
Bilirubin, Total: 0.7

## 2021-07-10 LAB — IRON AND TIBC
Iron: 93 ug/dL (ref 28–170)
Saturation Ratios: 26 % (ref 10.4–31.8)
TIBC: 358 ug/dL (ref 250–450)
UIBC: 265 ug/dL

## 2021-07-10 LAB — CBC: RBC: 3.72 — AB (ref 3.87–5.11)

## 2021-07-10 NOTE — Progress Notes (Signed)
Yolanda Flowers  [address] [place],  [place]  [id number] [phone]  Clinic Day:  07/10/2021  Referring physician: [name], MD  ASSESSMENT & PLAN:   Assessment & Plan: Essential thrombocythemia ([place]) Hydroxyurea was discontinued in October 2021 due to low normal platelets. Her platelet count remains normal off of hydroxyurea.  Leucocytosis She has had this intermittently since 2018. She had worsening in May 2022 with abnormal differential and bone marrow was recommended, but she did not have this done as she was hospitalized with stroke. When we saw her for follow up in August, this had improved so we continued observation. The leukocytosis/neutrophillia is stable today with an otherwise normal differential.   The patient understands the plans discussed today and is in agreement with them.  She knows to contact our office if she develops concerns prior to her next appointment.     Yolanda Pickles, PA-C  [facility] [address] [place] [address] Dept: [phone] Dept Fax: [phone]   No orders of the defined types were placed in this encounter.     CHIEF COMPLAINT:  CC: Essential thrombocytosis  Current Treatment:  Observation   HISTORY OF PRESENT ILLNESS:  Yolanda Flowers is a 80 year old female with a history of essential thrombocythemia originally diagnosed in 66.  She was treated with hydroxyurea 500 mg 3 times daily.  We have had to occasionally adjust doses because of leukopenia or anemia.  She also had a malignant melanoma of her left first toe resected in November 2015 with partial amputation of the toe and has done well.  She has had multiple other skin cancers removed over the years.  In March 2017, she was seen by Dr. Denman George for HSIL/VIN 3 of the right posterior labia minora and underwent vulvectomy.  Hydroxyurea was decreased to 500 mg twice daily  prior to surgery, as we did not feel we could safely hold hydroxyurea.  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  The patient had been seeing the gynecologist every 6 months, but has not continued regular follow-ups there. The platelet count increased in May 2017 to 657,000, so her dose of hydroxyurea was increased to 3 times daily, then subsequently decreased to twice a day due to leukopenia and anemia.  Her platelets had remained between under 500,000 since August 2018.  Bone density scan in March 2018 revealed osteopenia with a T-score of -1.6 in the femur, for which she is on calcium and vitamin-D.  She has had chronic back pain, which is attributed to severe degenerative disease.  She had an MRI of the lumbar spine in March 2018, which revealed significant degenerative disc disease with mild spinal stenosis at L1 and L4, but moderate at L2-3.  Dr. Maryjean Ka has given her injections, as well at hydrocodone/APAP 5/325.  She has had intermittent leukocytosis since 2018, felt to be secondary to her essential thrombocythemia.   She was evaluated in the emergency department in October 2019 and had a GI bleed. CT abdomen and pelvis revealed acute uncomplicated diverticulitis of the sigmoid colon with no other acute abnormalities.  A moderate to large hiatal hernia was seen.  No focal hepatic lesions or intrahepatic biliary dilatation were seen.  She saw Dr. Geraldo Pitter and underwent nuclear med stress test in November, which did not reveal any evidence of inducible ischemia or other abnormality.  Left  ventricular size and function was normal with an ejection fraction of 79%.  She states she was placed on Spiriva and Ventolin.  She underwent EGD and colonoscopy in December  2019, and no significant source of blood loss was identified.  Repeat iron studies, B12 and folate did not reveal any nutritional deficiency  She was  instructed to continue lansoprazole.  She had dilatation of an esophageal stricture and removal of a polyp. Pathology revealed a fragmented tubular adenoma.    In January 2020, she had worsening anemia, in addition to continued episodes of chest pain and worsening dyspnea with exertion at her routine follow-up. Due to the severe dyspnea and right lower extremity edema, we obtained a CT angiogram chest and venous Doppler ultrasound of the right lower extremity. CTA chest did not reveal any evidence of pulmonary embolism.  There was good opacification of the pulmonary arteries.  Diffuse coronary artery calcifications with cardiomegaly and moderate thoracic aortic atherosclerosis was seen.  There was a 3.6 mm noncalcified nodule in the anterior inferior right upper lobe.  Non-contrasted CT chest in 6-12 months was recommended.  Right lower extremity venous Doppler ultrasound did not reveal any evidence of deep venous thrombosis.  Further evaluation of the anemia did not reveal evidence of hemolysis or monoclonal gammopathy. In February 2020, she had persistent anemia with a hemoglobin of 9.7.  As she was symptomatic, hydroxyurea 500 mg was decreased to once daily.  Soluble transferrin was elevated, which was consistent with iron deficiency.  She saw Dr. Lyda Jester and he placed her on iron supplementation daily in the form of ferrous sulfate 65 mg. She was seen again in March and had improvement in her anemia with a hemoglobin of 10.2.  Her thrombocythemia remained controlled on hydroxyurea 500 mg daily. We encouraged her to have follow-up for her vulvar cancer, so followed up with Dr. Carlena Bjornstad and her exam was normal.   He did not feel he needed to check her again for 2 years unless she had problems.  Due to chronic dyspnea, she underwent multiple tests at [facility].  Apparently, she did not have evidence of COPD.  She was therefore referred to Cardiology.   She saw Dr. Geraldo Pitter and was undergoing cardiac  evaluation when we saw her in January 2021.  She had a bilateral screening mammogram in February 2021, which revealed an area of calcifications in the right breast warranting further evaluation.  A diagnostic right mammogram was ordered, but this was delayed, because she underwent the open heart surgery with CABG x4 in March.  She had a monitor placed in the left chest wall the time of surgery.  She did not feel she can have mammogram due to the monitor in the left chest wall.  She then had 2 strokes following her surgery, from which she has recovered well, except for balance issues and left eye blindness.  She ambulates with a cane or walker depending on her circumstances.  She is on clopidogrel 75 mg daily, atorvastatin 80 mg daily, metoprolol 25 mg twice daily and pantoprazole 40 mg daily.  She had a venous Doppler ultrasound of the right lower extremity in March for swelling postoperatively which did not reveal any evidence of deep venous thrombosis.  She had a CT chest in July to follow-up on the pulmonary nodule which were stable. She finally underwent diagnostic right mammogram from July which revealed likely benign 6 mm group of calcifications involving the upper inner quadrant at middle depth. Bilateral diagnostic mammogram  in February 2022 was recommended     At her visit in October 2021, she reported elevated transaminases and purpura, which was attributed to atorvastatin.  Dr. Geraldo Pitter reduced the dose from 80 mg to 40 mg with normalization of the transaminases and no further purpura.  Her platelets were in low normal range, so hydroxyurea was held and since her platelets have remained normal, hydroxyurea was discontinued. She intermittent mild leukocytosis/neutrophilia since 2018.  She has occasionally had immature cells on her blood smear, but not blasts. When I saw her in May, she had worsening leukocytosis with persistent immature cells and worsening anemia, so recommended a bone marrow biopsy, but  this was never done as the patient was hospitalized with a stroke and discharged to rehab. She was seen again in August at which time her white count had decreased from 24.2 to 19.9 with an [place] of 1473, with 11% lymphocytes, 10% monocytes and 2.4 % basophils and her hemoglobin had improved from 10.6 to 11.3. The platelet count remained normal, so continued observation was recommended.   INTERVAL HISTORY:  Yolanda Flowers is here today for repeat clinical assessment. She states she has been doing fairly well. She reports mild hand weakness since her last stroke. She denies abnormal bruising or bleeding.   She denies fevers, chills or other sign of infection. She reports chronic back pain which is stable. She continues Tylenol Arthritis and topical patches with some relief. Her appetite is good. Her weight has increased 7 pounds over last 3 months .  She continues to decline the COVID-19 vaccine due to her medical comorbidities. She states her systolic blood pressure is usually between 130 and 140 at home but she doesn't check it regularly. I encouraged her to check at least a few times a week and let Dr. Rica Records know if it continues to be high.  REVIEW OF SYSTEMS:  Review of Systems  Constitutional:  Negative for appetite change, chills, fatigue, fever and unexpected weight change.  HENT:   Negative for lump/mass, mouth sores and sore throat.   Respiratory:  Negative for cough and shortness of breath.   Cardiovascular:  Negative for chest pain and leg swelling.  Gastrointestinal:  Negative for abdominal pain, constipation, diarrhea, nausea and vomiting.  Endocrine: Negative for hot flashes.  Genitourinary:  Negative for difficulty urinating, dysuria, frequency and hematuria.   Musculoskeletal:  Positive for back pain (chronic, stable). Negative for arthralgias, gait problem and myalgias.  Skin:  Negative for rash.  Neurological:  Negative for dizziness, gait problem and headaches.  Hematological:  Negative for  adenopathy. Does not bruise/bleed easily.  Psychiatric/Behavioral:  Negative for depression and sleep disturbance. The patient is not nervous/anxious.     VITALS:  Blood pressure (!) 177/86, pulse 64, temperature 98.6 F (37 C), temperature source Oral, resp. rate 20, height '5\' 5"'  (1.651 m), weight 165 lb 1.6 oz (74.9 kg), SpO2 96 %.  Wt Readings from Last 3 Encounters:  07/10/21 165 lb 1.6 oz (74.9 kg)  04/11/21 158 lb (71.7 kg)  04/09/21 158 lb 6.4 oz (71.8 kg)    Body mass index is 27.47 kg/m.  Performance status (ECOG): 1 - Symptomatic but completely ambulatory  PHYSICAL EXAM:  Physical Exam Vitals and nursing note reviewed.  Constitutional:      General: She is not in acute distress.    Appearance: Normal appearance.  HENT:     Head: Normocephalic and atraumatic.     Mouth/Throat:     Mouth: Mucous membranes are  moist.     Pharynx: Oropharynx is clear. No oropharyngeal exudate or posterior oropharyngeal erythema.  Eyes:     General: Lids are everted, no foreign bodies appreciated. No scleral icterus.    Extraocular Movements: Extraocular movements intact.     Conjunctiva/sclera: Conjunctivae normal.     Pupils: Pupils are equal, round, and reactive to light.  Cardiovascular:     Rate and Rhythm: Normal rate and regular rhythm.     Heart sounds: Normal heart sounds. No murmur heard.   No friction rub. No gallop.  Pulmonary:     Effort: Pulmonary effort is normal.     Breath sounds: Normal breath sounds. No wheezing, rhonchi or rales.  Abdominal:     General: There is no distension.     Palpations: Abdomen is soft. There is no hepatomegaly, splenomegaly or mass.     Tenderness: There is no abdominal tenderness.  Musculoskeletal:        General: Normal range of motion.     Cervical back: Normal range of motion and neck supple. No tenderness.     Right lower leg: No edema.     Left lower leg: No edema.  Lymphadenopathy:     Cervical: No cervical adenopathy.      Upper Body:     Right upper body: No supraclavicular or axillary adenopathy.     Left upper body: No supraclavicular or axillary adenopathy.     Lower Body: No right inguinal adenopathy. No left inguinal adenopathy.  Skin:    General: Skin is warm and dry.     Coloration: Skin is not jaundiced.     Findings: No rash.  Neurological:     Mental Status: She is alert and oriented to person, place, and time.     Cranial Nerves: No cranial nerve deficit.  Psychiatric:        Mood and Affect: Mood normal.        Behavior: Behavior normal.        Thought Content: Thought content normal.    LABS:   CBC Latest Ref Rng & Units 07/10/2021 04/09/2021 03/30/2021  WBC - 19.4 19.9 23.7  Hemoglobin 12.0 - 16.0 11.4(A) 11.3(A) 10.8(A)  Hematocrit 36 - 46 35(A) 35(A) 34(A)  Platelets 150 - 399 135(A) 145(A) 160   CMP Latest Ref Rng & Units 07/10/2021 04/09/2021 03/30/2021  Glucose 70 - 99 mg/dL - - -  BUN 4 - '21 17 20 19  ' Creatinine 0.5 - 1.1 1.1 1.1 1.1  Sodium 137 - 147 144 142 139  Potassium 3.4 - 5.3 4.4 4.5 4.4  Chloride 99 - 108 109(A) 105 101  CO2 13 - 22 27(A) 24(A) 21  Calcium 8.7 - 10.7 9.0 9.0 8.6(A)  Total Protein 6.5 - 8.1 g/dL - - -  Total Bilirubin 0.3 - 1.2 mg/dL - - -  Alkaline Phos 25 - 125 78 82 98  AST 13 - 35 '31 29 17  ' ALT 7 - 35 '23 17 8     ' No results found for: CEA1 / No results found for: CEA1 No results found for: PSA1 No results found for: OHY073 No results found for: CAN125  No results found for: TOTALPROTELP, ALBUMINELP, A1GS, A2GS, BETS, BETA2SER, GAMS, MSPIKE, SPEI Lab Results  Component Value Date   TIBC 305 12/18/2020   FERRITIN 198 12/18/2020   IRONPCTSAT 15.4 12/18/2020   No results found for: LDH  STUDIES:  CUP PACEART REMOTE DEVICE CHECK  Result Date: 07/10/2021 ILR summary  report received. Battery status OK. Normal device function. No new symptom, tachy, brady, or pause episodes. No new AF episodes. Monthly summary reports and ROV/PRN LH      HISTORY:   Past Medical History:  Diagnosis Date   Abdominal pain    Accelerating angina ([place]) 08/26/2019   Acute blood loss anemia 12/14/2019   Acute cardioembolic stroke ([place]) 6/50/3546   Allergic rhinitis 06/21/2014   Anxiety    Arthritis    Asthma    Atherosclerosis of arteries 08/24/2019   Bilateral edema of lower extremity    Body mass index (BMI) 25.0-25.9, adult 08/24/2019   Body mass index 29.0-29.9, adult 08/24/2019   CAD (coronary artery disease) 10/25/2019   Cellulitis of right leg    Chronic low back pain 01/27/2018   Combined hyperlipidemia 12/22/8125   Complication of anesthesia    difficulty waking   Congestive heart failure (CHF) ([place])    COPD (chronic obstructive pulmonary disease) ([place])    Coronary artery disease    Coronary artery disease involving native heart without angina pectoris    Dyspnea 02/09/2019   Dyspnea on exertion    Embolic stroke ([place]) 01/03/16   Essential (primary) hypertension 02/20/2021   Essential thrombocythemia ([place]) 06/22/2014   Essential thrombocytosis ([place]) 06/22/2014   Fall at home, initial encounter 12/21/2020   Gastrointestinal bleeding 06/23/2018   GERD (gastroesophageal reflux disease)    GERD without esophagitis 06/21/2014   History of melanoma excision    left greast toe 2015   History of squamous cell carcinoma excision    face--  multiple excisions   History of subdural hematoma    2008   HTN (hypertension) 06/21/2014   Hypertension    Ischial bursitis, right 08/24/2019   Leucocytosis 12/14/2019   Leukocytosis 12/14/2019   Malignant melanoma of great toe ([place]) 08/24/2019   Melanoma in situ ([place]) 06/29/2014   Mixed hyperlipidemia 08/24/2019   Opioid dependence ([place]) 08/24/2019   OSA (obstructive sleep apnea)    intolerant   Pedal edema 08/24/2019   Pneumonia of both lungs due to infectious organism 12/20/2020   Renal insufficiency 08/24/2019   S/P CABG x 4 11/04/2019   Scoliosis (and kyphoscoliosis), idiopathic 01/27/2018   Skin cancer  08/24/2019   Solitary pulmonary nodule on lung CT 02/09/2019   Squamous cell carcinoma, scalp/neck 08/24/2019   Stroke ([place]) 12/21/2020   Stroke due to embolism ([place]) 11/25/2019   Subdural hematoma 12/20/2020   Swelling of limb 08/24/2019   Thrombocytosis    takes hydoxyurea--  MONITORED BY DR Hinton Rao [facility])   VIN III (vulvar intraepithelial neoplasia III)    Vitamin B 12 deficiency 08/24/2019   Vitamin D deficiency 08/24/2019   Wears dentures    UPPER AND LOWER PARTIAL   Wears glasses     Past Surgical History:  Procedure Laterality Date   ABDOMINAL HYSTERECTOMY  age 28   CARDIAC CATHETERIZATION  03/172021   CORONARY ARTERY BYPASS GRAFT N/A 11/04/2019   Procedure: CORONARY ARTERY BYPASS GRAFTING (CABG) x 4, with ENDOSCOPIC HARVESTING OF RIGHT GREATER SAPHENOUS VEIN.;  Surgeon: Gaye Pollack, MD;  Location: [facility];  Service: Open Heart Surgery;  Laterality: N/A;   INTRAVASCULAR ULTRASOUND/IVUS N/A 11/03/2019   Procedure: Intravascular Ultrasound/IVUS;  Surgeon: Nelva Bush, MD;  Location: [facility];  Service: Cardiovascular;  Laterality: N/A;   KNEE ARTHROSCOPY Left 2004   LEFT HEART CATH AND CORONARY ANGIOGRAPHY N/A 11/03/2019   Procedure: LEFT HEART CATH AND CORONARY ANGIOGRAPHY;  Surgeon: End,  Harrell Gave, MD;  Location: [facility];  Service: Cardiovascular;  Laterality: N/A;   LOOP RECORDER INSERTION N/A 11/30/2019   Procedure: LOOP RECORDER INSERTION;  Surgeon: Thompson Grayer, MD;  Location: [facility];  Service: Cardiovascular;  Laterality: N/A;   MELANOMA EXCISION  2015   left great toe   REPAIR PERONEAL TENDONS ANKLE  2004   SUBDURAL HEMATOMA EVACUATION VIA CRANIOTOMY  2008      week later  post-op  [facility]   TEE WITHOUT CARDIOVERSION N/A 11/04/2019   Procedure: TRANSESOPHAGEAL ECHOCARDIOGRAM (TEE);  Surgeon: Gaye Pollack, MD;  Location: [facility];  Service: Open Heart Surgery;  Laterality: N/A;   VULVECTOMY N/A 10/24/2015   Procedure:  WIDE LOCAL EXCISION OF THE VULVA ;  Surgeon: Everitt Amber, MD;  Location: [facility];  Service: Gynecology;  Laterality: N/A;    Family History  Problem Relation Age of Onset   Clotting disorder Mother    Hypertension Mother    Heart disease Mother    Arthritis Mother    Stroke Father    Hypertension Father    Heart disease Father    Arthritis Father    Emphysema Father    Asthma Father    Kidney failure Father    Hypertension Sister    Heart disease Sister    Arthritis Sister    Hypertension Brother    Heart disease Brother    Arthritis Brother     Social History:  reports that she has never smoked. She has never used smokeless tobacco. She reports that she does not drink alcohol and does not use drugs.The patient is alone today.  Allergies:  Allergies  Allergen Reactions   Cephalexin Hives   Penicillin G    Ciprofloxacin Other (See Comments)    Gi intolerance   Penicillins Rash    Childhood reaction Did it involve swelling of the face/tongue/throat, SOB, or low BP? No Did it involve sudden or severe rash/hives, skin peeling, or any reaction on the inside of your mouth or nose? No Did you need to seek medical attention at a hospital or doctor's office? No When did it last happen?      50 years ago If all above answers are "NO", may proceed with cephalosporin use.     Current Medications: Current Outpatient Medications  Medication Sig Dispense Refill   acetaminophen (TYLENOL) 325 MG tablet Take 1-2 tablets (325-650 mg total) by mouth every 4 (four) hours as needed for mild pain.     ammonium lactate (LAC-HYDRIN) 12 % lotion Apply 1 application topically as needed for dry skin.     Ascorbic Acid (VITAMIN C) 1000 MG tablet Take 1,000 mg by mouth daily.     aspirin EC 81 MG tablet Take 1 tablet (81 mg total) by mouth daily. Swallow whole. 90 tablet 3   atorvastatin (LIPITOR) 20 MG tablet Take 1 tablet (20 mg total) by mouth daily. 90 tablet 3   Calcium  Carb-Cholecalciferol (CALCIUM 600 + D PO) Take 1 tablet by mouth in the morning and at bedtime.      fluticasone (FLONASE) 50 MCG/ACT nasal spray Place 2 sprays into both nostrils at bedtime.      Magnesium 250 MG TABS Take 250 mg by mouth at bedtime.      meclizine (ANTIVERT) 12.5 MG tablet Take 12.5 mg by mouth 2 (two) times daily as needed.     metoprolol tartrate (LOPRESSOR) 25 MG tablet TAKE 1 TABLET BY  MOUTH TWICE A DAY 180 tablet 3   Multiple Vitamins-Minerals (PRESERVISION AREDS 2) CAPS Take 1 capsule by mouth in the morning and at bedtime.      pantoprazole (PROTONIX) 40 MG tablet TAKE 1 TABLET BY MOUTH EVERY DAY 90 tablet 1   vitamin B-12 (CYANOCOBALAMIN) 500 MCG tablet Take 1 tablet (500 mcg total) by mouth daily.     Vitamin D, Ergocalciferol, (DRISDOL) 1.25 MG (50000 UT) CAPS capsule Take 50,000 Units by mouth every 14 (fourteen) days.     No current facility-administered medications for this visit.

## 2021-07-10 NOTE — Assessment & Plan Note (Signed)
Hydroxyurea was discontinued in October 2021 due to low normal platelets. Her platelet count remains normal off of hydroxyurea.

## 2021-07-10 NOTE — Assessment & Plan Note (Signed)
She has had this intermittently since 2018. She had worsening in May 2022 with abnormal differential and bone marrow was recommended, but she did not have this done as she was hospitalized with stroke. When we saw her for follow up in August, this had improved so we continued observation. The leukocytosis/neutrophillia is stable today with an otherwise normal differential.

## 2021-07-19 NOTE — Progress Notes (Signed)
Carelink Summary Report / Loop Recorder 

## 2021-08-10 ENCOUNTER — Ambulatory Visit (INDEPENDENT_AMBULATORY_CARE_PROVIDER_SITE_OTHER): Payer: PPO

## 2021-08-10 DIAGNOSIS — I6349 Cerebral infarction due to embolism of other cerebral artery: Secondary | ICD-10-CM | POA: Diagnosis not present

## 2021-08-13 LAB — CUP PACEART REMOTE DEVICE CHECK
Date Time Interrogation Session: 20221223230337
Implantable Pulse Generator Implant Date: 20210413

## 2021-08-20 ENCOUNTER — Other Ambulatory Visit: Payer: Self-pay | Admitting: Cardiology

## 2021-08-21 NOTE — Progress Notes (Signed)
Carelink Summary Report / Loop Recorder 

## 2021-08-27 DIAGNOSIS — I10 Essential (primary) hypertension: Secondary | ICD-10-CM | POA: Diagnosis not present

## 2021-08-27 DIAGNOSIS — E559 Vitamin D deficiency, unspecified: Secondary | ICD-10-CM | POA: Diagnosis not present

## 2021-08-27 DIAGNOSIS — M545 Low back pain, unspecified: Secondary | ICD-10-CM | POA: Diagnosis not present

## 2021-08-27 DIAGNOSIS — E782 Mixed hyperlipidemia: Secondary | ICD-10-CM | POA: Diagnosis not present

## 2021-08-27 DIAGNOSIS — K219 Gastro-esophageal reflux disease without esophagitis: Secondary | ICD-10-CM | POA: Diagnosis not present

## 2021-08-27 DIAGNOSIS — R600 Localized edema: Secondary | ICD-10-CM | POA: Diagnosis not present

## 2021-08-27 DIAGNOSIS — I2510 Atherosclerotic heart disease of native coronary artery without angina pectoris: Secondary | ICD-10-CM | POA: Diagnosis not present

## 2021-08-27 DIAGNOSIS — I5189 Other ill-defined heart diseases: Secondary | ICD-10-CM | POA: Diagnosis not present

## 2021-08-27 DIAGNOSIS — N3281 Overactive bladder: Secondary | ICD-10-CM | POA: Diagnosis not present

## 2021-08-27 DIAGNOSIS — I7090 Unspecified atherosclerosis: Secondary | ICD-10-CM | POA: Diagnosis not present

## 2021-08-27 DIAGNOSIS — J449 Chronic obstructive pulmonary disease, unspecified: Secondary | ICD-10-CM | POA: Diagnosis not present

## 2021-08-27 DIAGNOSIS — D75839 Thrombocytosis, unspecified: Secondary | ICD-10-CM | POA: Diagnosis not present

## 2021-09-07 DIAGNOSIS — K922 Gastrointestinal hemorrhage, unspecified: Secondary | ICD-10-CM | POA: Diagnosis not present

## 2021-09-07 DIAGNOSIS — K226 Gastro-esophageal laceration-hemorrhage syndrome: Secondary | ICD-10-CM | POA: Diagnosis not present

## 2021-09-07 DIAGNOSIS — R609 Edema, unspecified: Secondary | ICD-10-CM | POA: Diagnosis not present

## 2021-09-07 DIAGNOSIS — R7989 Other specified abnormal findings of blood chemistry: Secondary | ICD-10-CM | POA: Diagnosis not present

## 2021-09-07 DIAGNOSIS — K208 Other esophagitis without bleeding: Secondary | ICD-10-CM | POA: Diagnosis not present

## 2021-09-07 DIAGNOSIS — I10 Essential (primary) hypertension: Secondary | ICD-10-CM | POA: Diagnosis not present

## 2021-09-07 DIAGNOSIS — R296 Repeated falls: Secondary | ICD-10-CM | POA: Diagnosis not present

## 2021-09-07 DIAGNOSIS — E785 Hyperlipidemia, unspecified: Secondary | ICD-10-CM | POA: Diagnosis not present

## 2021-09-07 DIAGNOSIS — I110 Hypertensive heart disease with heart failure: Secondary | ICD-10-CM | POA: Diagnosis not present

## 2021-09-07 DIAGNOSIS — Z951 Presence of aortocoronary bypass graft: Secondary | ICD-10-CM | POA: Diagnosis not present

## 2021-09-07 DIAGNOSIS — D691 Qualitative platelet defects: Secondary | ICD-10-CM | POA: Diagnosis not present

## 2021-09-07 DIAGNOSIS — I700 Atherosclerosis of aorta: Secondary | ICD-10-CM | POA: Diagnosis not present

## 2021-09-07 DIAGNOSIS — R918 Other nonspecific abnormal finding of lung field: Secondary | ICD-10-CM | POA: Diagnosis not present

## 2021-09-07 DIAGNOSIS — Z89422 Acquired absence of other left toe(s): Secondary | ICD-10-CM | POA: Diagnosis not present

## 2021-09-07 DIAGNOSIS — S0990XA Unspecified injury of head, initial encounter: Secondary | ICD-10-CM | POA: Diagnosis not present

## 2021-09-07 DIAGNOSIS — K449 Diaphragmatic hernia without obstruction or gangrene: Secondary | ICD-10-CM | POA: Diagnosis not present

## 2021-09-07 DIAGNOSIS — K223 Perforation of esophagus: Secondary | ICD-10-CM | POA: Diagnosis not present

## 2021-09-07 DIAGNOSIS — M47812 Spondylosis without myelopathy or radiculopathy, cervical region: Secondary | ICD-10-CM | POA: Diagnosis not present

## 2021-09-07 DIAGNOSIS — Z9181 History of falling: Secondary | ICD-10-CM | POA: Diagnosis not present

## 2021-09-07 DIAGNOSIS — D649 Anemia, unspecified: Secondary | ICD-10-CM | POA: Diagnosis not present

## 2021-09-07 DIAGNOSIS — D72829 Elevated white blood cell count, unspecified: Secondary | ICD-10-CM | POA: Diagnosis not present

## 2021-09-07 DIAGNOSIS — R58 Hemorrhage, not elsewhere classified: Secondary | ICD-10-CM | POA: Diagnosis not present

## 2021-09-07 DIAGNOSIS — Z7982 Long term (current) use of aspirin: Secondary | ICD-10-CM | POA: Diagnosis not present

## 2021-09-07 DIAGNOSIS — Z90710 Acquired absence of both cervix and uterus: Secondary | ICD-10-CM | POA: Diagnosis not present

## 2021-09-07 DIAGNOSIS — R0689 Other abnormalities of breathing: Secondary | ICD-10-CM | POA: Diagnosis not present

## 2021-09-07 DIAGNOSIS — K921 Melena: Secondary | ICD-10-CM | POA: Diagnosis not present

## 2021-09-07 DIAGNOSIS — Z8673 Personal history of transient ischemic attack (TIA), and cerebral infarction without residual deficits: Secondary | ICD-10-CM | POA: Diagnosis not present

## 2021-09-07 DIAGNOSIS — Z8249 Family history of ischemic heart disease and other diseases of the circulatory system: Secondary | ICD-10-CM | POA: Diagnosis not present

## 2021-09-07 DIAGNOSIS — D471 Chronic myeloproliferative disease: Secondary | ICD-10-CM | POA: Diagnosis not present

## 2021-09-07 DIAGNOSIS — R531 Weakness: Secondary | ICD-10-CM | POA: Diagnosis not present

## 2021-09-07 DIAGNOSIS — I509 Heart failure, unspecified: Secondary | ICD-10-CM | POA: Diagnosis not present

## 2021-09-07 DIAGNOSIS — E041 Nontoxic single thyroid nodule: Secondary | ICD-10-CM | POA: Diagnosis not present

## 2021-09-07 DIAGNOSIS — Z880 Allergy status to penicillin: Secondary | ICD-10-CM | POA: Diagnosis not present

## 2021-09-07 DIAGNOSIS — D539 Nutritional anemia, unspecified: Secondary | ICD-10-CM | POA: Diagnosis not present

## 2021-09-07 DIAGNOSIS — K2210 Ulcer of esophagus without bleeding: Secondary | ICD-10-CM | POA: Diagnosis not present

## 2021-09-07 DIAGNOSIS — J449 Chronic obstructive pulmonary disease, unspecified: Secondary | ICD-10-CM | POA: Diagnosis not present

## 2021-09-07 DIAGNOSIS — R911 Solitary pulmonary nodule: Secondary | ICD-10-CM | POA: Diagnosis not present

## 2021-09-07 DIAGNOSIS — Z85820 Personal history of malignant melanoma of skin: Secondary | ICD-10-CM | POA: Diagnosis not present

## 2021-09-07 DIAGNOSIS — K2211 Ulcer of esophagus with bleeding: Secondary | ICD-10-CM | POA: Diagnosis not present

## 2021-09-07 DIAGNOSIS — K219 Gastro-esophageal reflux disease without esophagitis: Secondary | ICD-10-CM | POA: Diagnosis not present

## 2021-09-07 DIAGNOSIS — I2510 Atherosclerotic heart disease of native coronary artery without angina pectoris: Secondary | ICD-10-CM | POA: Diagnosis not present

## 2021-09-12 DIAGNOSIS — K922 Gastrointestinal hemorrhage, unspecified: Secondary | ICD-10-CM | POA: Diagnosis not present

## 2021-09-12 DIAGNOSIS — K219 Gastro-esophageal reflux disease without esophagitis: Secondary | ICD-10-CM | POA: Diagnosis not present

## 2021-09-12 DIAGNOSIS — E041 Nontoxic single thyroid nodule: Secondary | ICD-10-CM | POA: Diagnosis not present

## 2021-09-12 DIAGNOSIS — E663 Overweight: Secondary | ICD-10-CM | POA: Diagnosis not present

## 2021-09-12 DIAGNOSIS — Z6826 Body mass index (BMI) 26.0-26.9, adult: Secondary | ICD-10-CM | POA: Diagnosis not present

## 2021-09-13 ENCOUNTER — Telehealth: Payer: Self-pay

## 2021-09-13 NOTE — Telephone Encounter (Addendum)
Scheduling called pt and gave her an appt with Kelli,PA, for 09/19/2021.    Pt called to make you aware that she has been in hospital with a bleed. " I went in with Hgb 6, and after 2 units blood it came up to 8.  I want to know what she thinks about giving me IV iron. I'm tired of the iron capsules, they hurt my stomach and make my stool black". I sent the message to Dr Hinton Rao @ [phone].

## 2021-09-14 ENCOUNTER — Telehealth: Payer: Self-pay | Admitting: Oncology

## 2021-09-14 NOTE — Telephone Encounter (Signed)
I have called the patient to schedule a hospital follow-up appt per staff msg from 09/13/21. Unable to reach her via phone, vm was left.

## 2021-09-17 ENCOUNTER — Ambulatory Visit (INDEPENDENT_AMBULATORY_CARE_PROVIDER_SITE_OTHER): Payer: PPO

## 2021-09-17 ENCOUNTER — Other Ambulatory Visit: Payer: Self-pay

## 2021-09-17 DIAGNOSIS — I6349 Cerebral infarction due to embolism of other cerebral artery: Secondary | ICD-10-CM | POA: Diagnosis not present

## 2021-09-17 LAB — CUP PACEART REMOTE DEVICE CHECK
Date Time Interrogation Session: 20230129230741
Implantable Pulse Generator Implant Date: 20210413

## 2021-09-18 ENCOUNTER — Other Ambulatory Visit: Payer: Self-pay | Admitting: Hematology and Oncology

## 2021-09-18 DIAGNOSIS — D473 Essential (hemorrhagic) thrombocythemia: Secondary | ICD-10-CM

## 2021-09-19 ENCOUNTER — Other Ambulatory Visit: Payer: Self-pay | Admitting: Hematology and Oncology

## 2021-09-19 ENCOUNTER — Other Ambulatory Visit: Payer: Self-pay

## 2021-09-19 ENCOUNTER — Inpatient Hospital Stay: Payer: PPO | Attending: Hematology and Oncology | Admitting: Hematology and Oncology

## 2021-09-19 ENCOUNTER — Inpatient Hospital Stay: Payer: PPO

## 2021-09-19 ENCOUNTER — Encounter: Payer: Self-pay | Admitting: Hematology and Oncology

## 2021-09-19 ENCOUNTER — Telehealth: Payer: Self-pay | Admitting: Oncology

## 2021-09-19 VITALS — BP 142/80 | HR 68 | Temp 98.7°F | Resp 20 | Ht 65.0 in | Wt 165.1 lb

## 2021-09-19 DIAGNOSIS — D72828 Other elevated white blood cell count: Secondary | ICD-10-CM

## 2021-09-19 DIAGNOSIS — D62 Acute posthemorrhagic anemia: Secondary | ICD-10-CM

## 2021-09-19 DIAGNOSIS — D473 Essential (hemorrhagic) thrombocythemia: Secondary | ICD-10-CM

## 2021-09-19 DIAGNOSIS — R300 Dysuria: Secondary | ICD-10-CM | POA: Insufficient documentation

## 2021-09-19 HISTORY — DX: Dysuria: R30.0

## 2021-09-19 LAB — HEPATIC FUNCTION PANEL
ALT: 21 (ref 7–35)
AST: 31 (ref 13–35)
Alkaline Phosphatase: 76 (ref 25–125)
Bilirubin, Total: 0.6

## 2021-09-19 LAB — BASIC METABOLIC PANEL
BUN: 19 (ref 4–21)
CO2: 25 — AB (ref 13–22)
Chloride: 107 (ref 99–108)
Creatinine: 1.2 — AB (ref 0.5–1.1)
Glucose: 102
Potassium: 4.1 (ref 3.4–5.3)
Sodium: 143 (ref 137–147)

## 2021-09-19 LAB — CBC AND DIFFERENTIAL
HCT: 32 — AB (ref 36–46)
Hemoglobin: 10.2 — AB (ref 12.0–16.0)
Neutrophils Absolute: 19.28
Platelets: 120 — AB (ref 150–399)
WBC: 24.1

## 2021-09-19 LAB — CBC: RBC: 3.32 — AB (ref 3.87–5.11)

## 2021-09-19 LAB — COMPREHENSIVE METABOLIC PANEL
Albumin: 4.4 (ref 3.5–5.0)
Calcium: 8.8 (ref 8.7–10.7)

## 2021-09-19 LAB — FERRITIN: Ferritin: 29 ng/mL (ref 11–307)

## 2021-09-19 MED ORDER — CIPROFLOXACIN HCL 500 MG PO TABS
500.0000 mg | ORAL_TABLET | Freq: Two times a day (BID) | ORAL | 0 refills | Status: DC
Start: 2021-09-19 — End: 2021-10-01

## 2021-09-19 NOTE — Assessment & Plan Note (Addendum)
Urinalysis is suspicious for urinary tract infection with positive nitrites, 3+ urine WBC, 18.  RBC and 1+ bacteria with greater than 182 WBCs per high-power field and WBC clumping.  She reports sensitivity to ciprofloxacin causing diarrhea, but sulfa drugs can affect the bone marrow, so I will place her on ciprofloxacin pending urine culture.  She knows she can take over-the-counter antidiarrheal for diarrhea as needed.  The urine culture returned positive for E. coli sensitive to ciprofloxacin.  The patient states that she develops vaginal yeast infections whenever she is on oral antibiotics so was given a dose of fluconazole 150 mg x1 to take as needed.

## 2021-09-19 NOTE — Progress Notes (Signed)
[place]  [address] [place],  [place]  [address] [phone]  Clinic Day:  09/19/2021  Referring physician: [name], MD  ASSESSMENT & PLAN:   Assessment & Plan: Essential thrombocythemia ([place]) Hydroxyurea was discontinued in October 2021 due to low normal platelets.  She now has mild thrombocytopenia.  We will continue to monitor this.  Leucocytosis She has had this intermittently since 2018. She had worsening in May 2022 with abnormal differential and bone marrow was recommended, but she did not have this done as she was hospitalized with stroke. When we saw her for follow up in August, this had improved so we continued observation.  She had worsening leukocytosis during her hospitalization, likely reactive due to her acute illness.  It had improved prior to discharge, but she now has worsening leukocytosis again, likely due to urinary tract infection.  We will continue to monitor this.  Acute blood loss anemia The patient was admitted in January with an esophageal tear.  Her hemoglobin dropped to from 8.3 to now I have to go inherently look 6.9 during admission, so she received 1 unit of packed red blood cells with improvement in her hemoglobin to 8.2. Iron studies during hospitalization did not reveal iron deficiency.  She has not been taking an oral iron supplement, as she states they told her to discuss with her PCP and Dr. Rica Records recommended we handle this.  Her hemoglobin is improving without oral iron supplementation.  Her ferritin was low normal at 29, so Dr. Hinton Rao recommended IV iron replacement, but the patient declined.  We will see her back in 1 month with a CBC.  Dysuria Urinalysis is suspicious for urinary tract infection with positive nitrites, 3+ urine WBC, 18.  RBC and 1+ bacteria with greater than 182 WBCs per high-power field and WBC clumping.  She reports sensitivity to ciprofloxacin causing diarrhea, but sulfa drugs can affect  the bone marrow, so I will place her on ciprofloxacin pending urine culture.  She knows she can take over-the-counter antidiarrheal for diarrhea as needed.  The urine culture returned positive for E. coli sensitive to ciprofloxacin.  The patient states that she develops vaginal yeast infections whenever she is on oral antibiotics so was given a dose of fluconazole 150 mg x1 to take as needed.    The patient understands the plans discussed today and is in agreement with them.  She knows to contact our office if she develops concerns prior to her next appointment.   I provided 30 minutes of face-to-face time during this encounter and > 50% was spent counseling as documented under my assessment and plan.    Marvia Pickles, PA-C  [facility] [address] [place] [address] Dept: [phone] Dept Fax: [phone]   Orders Placed This Encounter  Procedures   Miscellaneous test (send-out)    Standing Status:   Future    Number of Occurrences:   1    Standing Expiration Date:   09/19/2022    Order Specific Question:   Test name / description:    Answer:   stat urinalysis and urine culture at [facility]:  CC: Acute blood loss anemia  Current Treatment: None  HISTORY OF PRESENT ILLNESS:  Yolanda Flowers is a 81 year old female with a history of essential thrombocythemia originally diagnosed in 84.  She was treated with hydroxyurea 500 mg 3 times daily.  We have had to occasionally adjust doses because of leukopenia or anemia.  She also had a malignant melanoma of her left first toe resected in November 2015 with partial amputation of the toe and has done well.  She has had multiple other skin cancers removed over the years.  In March 2017, she was seen by Dr. Denman George for HSIL/VIN 3 of the right posterior labia minora and underwent vulvectomy.  Hydroxyurea was decreased to 500 mg twice daily prior to surgery, as  we did not feel we could safely hold hydroxyurea.  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  The patient had been seeing the gynecologist every 6 months, but has not continued regular follow-ups there. The platelet count increased in May 2017 to 657,000, so her dose of hydroxyurea was increased to 3 times daily, then subsequently decreased to twice a day due to leukopenia and anemia.  Her platelets had remained between under 500,000 since August 2018.  Bone density scan in March 2018 revealed osteopenia with a T-score of -1.6 in the femur, for which she is on calcium and vitamin-D.  She has had chronic back pain, which is attributed to severe degenerative disease.  She had an MRI of the lumbar spine in March 2018, which revealed significant degenerative disc disease with mild spinal stenosis at L1 and L4, but moderate at L2-3.  Dr. Maryjean Ka has given her injections, as well at hydrocodone/APAP 5/325.  She has had intermittent leukocytosis since 2018, felt to be secondary to her essential thrombocythemia.   She was evaluated in the emergency department in October 2019 and had a GI bleed. CT abdomen and pelvis revealed acute uncomplicated diverticulitis of the sigmoid colon with no other acute abnormalities.  A moderate to large hiatal hernia was seen.  No focal hepatic lesions or intrahepatic biliary dilatation were seen.  She saw Dr. Geraldo Pitter and underwent nuclear med stress test in November, which did not reveal any evidence of inducible ischemia or other abnormality.  Left ventricular size and function was normal with an ejection fraction of 79%.  She states she was placed on Spiriva and Ventolin.  She underwent EGD and colonoscopy in December  2019, and no significant source of blood loss was identified.  Repeat iron studies, B12 and folate did not reveal any nutritional deficiency  She was instructed to continue  lansoprazole.  She had dilatation of an esophageal stricture and removal of a polyp. Pathology revealed a fragmented tubular adenoma.    In January 2020, she had worsening anemia, in addition to continued episodes of chest pain and worsening dyspnea with exertion at her routine follow-up. Due to the severe dyspnea and right lower extremity edema, we obtained a CT angiogram chest and venous Doppler ultrasound of the right lower extremity. CTA chest did not reveal any evidence of pulmonary embolism.  There was good opacification of the pulmonary arteries.  Diffuse coronary artery calcifications with cardiomegaly and moderate thoracic aortic atherosclerosis was seen.  There was a 3.6 mm noncalcified nodule in the anterior inferior right upper lobe.  Non-contrasted CT chest in 6-12 months was recommended.  Right lower extremity venous Doppler ultrasound did not reveal any evidence of deep venous thrombosis.  Further evaluation of the anemia did not reveal evidence of hemolysis or monoclonal gammopathy. In February 2020, she had persistent anemia with a hemoglobin of 9.7.  As she was symptomatic, hydroxyurea 500 mg was decreased to once daily.  Soluble transferrin was elevated, which was consistent with iron deficiency.  She saw Dr. Lyda Jester and he placed her on iron supplementation daily in the form of ferrous sulfate 65 mg. She was seen again in March and had improvement in her anemia with a hemoglobin of 10.2.  Her thrombocythemia remained controlled on hydroxyurea 500 mg daily. We encouraged her to have follow-up for her vulvar cancer, so followed up with Dr. Carlena Bjornstad and her exam was normal.   He did not feel he needed to check her again for 2 years unless she had problems.  Due to chronic dyspnea, she underwent multiple tests at [facility].  Apparently, she did not have evidence of COPD.  She was therefore referred to Cardiology.   She saw Dr. Geraldo Pitter and was undergoing cardiac evaluation when we saw  her in January 2021.  She had a bilateral screening mammogram in February 2021, which revealed an area of calcifications in the right breast warranting further evaluation.  A diagnostic right mammogram was ordered, but this was delayed, because she underwent the open heart surgery with CABG x4 in March.  She had a monitor placed in the left chest wall the time of surgery.  She did not feel she can have mammogram due to the monitor in the left chest wall.  She then had 2 strokes following her surgery, from which she has recovered well, except for balance issues and left eye blindness.  She ambulates with a cane or walker depending on her circumstances.  She is on clopidogrel 75 mg daily, atorvastatin 80 mg daily, metoprolol 25 mg twice daily and pantoprazole 40 mg daily.  She had a venous Doppler ultrasound of the right lower extremity in March for swelling postoperatively which did not reveal any evidence of deep venous thrombosis.  She had a CT chest in July to follow-up on the pulmonary nodule which were stable. She finally underwent diagnostic right mammogram from July which revealed likely benign 6 mm group of calcifications involving the upper inner quadrant at middle depth. Bilateral diagnostic mammogram in February 2022 was recommended     At her visit in October 2021, she reported elevated transaminases and purpura, which was attributed to atorvastatin.  Dr. Geraldo Pitter reduced the dose from 80 mg to 40 mg with normalization of the transaminases and no further purpura.  Her platelets were in low normal range, so hydroxyurea was held and since her platelets have remained normal, hydroxyurea was discontinued. She intermittent mild leukocytosis/neutrophilia since 2018.  She has occasionally had immature cells on her blood smear, but not blasts.   When I saw her in May, she had worsening leukocytosis with persistent immature cells and worsening anemia, so recommended a bone marrow biopsy, but this was never  done as the patient was hospitalized with a stroke and discharged to rehab. She was seen again in August, at which time her white count had decreased from 24.2 to 19.9 with an [place] of 1473, with 11% lymphocytes, 10% monocytes and 2.4 % basophils and her hemoglobin had improved from 10.6 to 11.3. The platelet count remained normal, so continued observation was recommended.  Her blood counts were stable in November.  INTERVAL HISTORY:  Vitoria is here today for repeat clinical assessment after hospitalization.  She was admitted on January 20th with a 3 day history of sensation of an object stuck in her throat following attempted swallowing of calcium supplementation. She was eventually able to dislodge item after several hours.  She then developed stool  that she described as "motor oil" for 2 days.  She was evaluated in the emergency room and was found to have hemoglobin of 8.3 decreased from 11.4 in November 2022.   She underwent urgent EGD and was found to have a 3 cm mucosal tear within the distal 5 cm of her esophagus, felt to be most likely secondary to mucosal tear following calcium carbonate impaction. CT neck and chest was performed to evaluate for possibility of mediastinitis developing following possible full focus mucosal tear. This imaging did not reveal active inflammatory changes. 3 clips were placed closing the tear successfully and complete hemostasis was achieved.  She was placed on twice daily pantoprazole with slow advancement of diet from clear liquid to house select over 2 days.  She had an acute drop in hemoglobin to 6.9 following further equilibration, so received 1 unit PRBC with appropriate response to 8.2 g/dL. Her hemoglobin remained at approximately 8 for the remainder of admission.  She was instructed to take iron supplementation while at home and to avoid calcium carbonate for time being and to transition to over-the-counter chewable supplementation.  Iron/TIBC did not reveal iron  deficiency.  At hospital discharge, follow-up with her primary care provider was recommended for repeat ultrasound of thyroid to evaluate cystic nodule,  repeat noncontrast CT chest in 3 to 6 months, approximately April to July 2023 to monitor 75 mm pulmonary nodules and consideration of resumption of metoprolol following after longer interval after GI bleed.   She reports persistent fatigue since discharge from hospital last week.  She reports dysuria for at least a week, but has not sought medical attention.  I advised her in the future she should not wait to see someone about a potential urinary tract infection, as it can be dangerous.  She has a telehealth visit with Dr. Rica Records today.  She denies fevers or chills. She denies pain. Her appetite is good. Her weight has been stable.  She has discontinued pantoprazole, as she feels it caused her to have this infection and she is worried about her kidneys.  I advised her, pantoprazole should not cause the infection.  Nevertheless, she is unwilling to resume pantoprazole.  REVIEW OF SYSTEMS:  Review of Systems  Constitutional:  Positive for fatigue. Negative for appetite change, chills, fever and unexpected weight change.  HENT:   Negative for lump/mass, mouth sores and sore throat.   Respiratory:  Negative for cough and shortness of breath.   Cardiovascular:  Negative for chest pain and leg swelling.  Gastrointestinal:  Negative for abdominal pain, constipation, diarrhea, nausea and vomiting.  Genitourinary:  Positive for dysuria. Negative for difficulty urinating, frequency and hematuria.   Musculoskeletal:  Negative for arthralgias, back pain and myalgias.  Skin:  Negative for rash.  Neurological:  Negative for dizziness and headaches.  Hematological:  Negative for adenopathy. Does not bruise/bleed easily.  Psychiatric/Behavioral:  Negative for depression and sleep disturbance. The patient is not nervous/anxious.     VITALS:  Blood pressure (!)  142/80, pulse 68, temperature 98.7 F (37.1 C), temperature source Oral, resp. rate 20, height _0  (1.651 m), weight 165 lb 1.6 oz (74.9 kg), SpO2 99 %.  Wt Readings from Last 3 Encounters:  09/19/21 165 lb 1.6 oz (74.9 kg)  07/10/21 165 lb 1.6 oz (74.9 kg)  04/11/21 158 lb (71.7 kg)    Body mass index is 27.47 kg/m.  Performance status (ECOG): 1 - Symptomatic but completely ambulatory  PHYSICAL EXAM:  Physical Exam Vitals  and nursing note reviewed.  Constitutional:      General: She is not in acute distress.    Appearance: Normal appearance.  HENT:     Head: Normocephalic and atraumatic.     Mouth/Throat:     Mouth: Mucous membranes are moist.     Pharynx: Oropharynx is clear. No oropharyngeal exudate or posterior oropharyngeal erythema.  Eyes:     General: No scleral icterus.    Extraocular Movements: Extraocular movements intact.     Conjunctiva/sclera: Conjunctivae normal.     Pupils: Pupils are equal, round, and reactive to light.  Cardiovascular:     Rate and Rhythm: Normal rate and regular rhythm.     Heart sounds: Normal heart sounds. No murmur heard.   No friction rub. No gallop.  Pulmonary:     Effort: Pulmonary effort is normal.     Breath sounds: Normal breath sounds. No wheezing, rhonchi or rales.  Abdominal:     General: There is no distension.     Palpations: Abdomen is soft. There is no hepatomegaly, splenomegaly or mass.     Tenderness: There is no abdominal tenderness.  Musculoskeletal:        General: Normal range of motion.     Cervical back: Normal range of motion and neck supple. No tenderness.     Right lower leg: No edema.     Left lower leg: No edema.  Lymphadenopathy:     Cervical: No cervical adenopathy.     Upper Body:     Right upper body: No supraclavicular or axillary adenopathy.     Left upper body: No supraclavicular or axillary adenopathy.     Lower Body: No right inguinal adenopathy. No left inguinal adenopathy.  Skin:     General: Skin is warm and dry.     Coloration: Skin is not jaundiced.     Findings: No rash.  Neurological:     Mental Status: She is alert and oriented to person, place, and time.     Cranial Nerves: No cranial nerve deficit.  Psychiatric:        Mood and Affect: Mood normal.        Behavior: Behavior normal.        Thought Content: Thought content normal.    LABS:   CBC Latest Ref Rng & Units 09/19/2021 07/10/2021 04/09/2021  WBC - 24.1 19.4 19.9  Hemoglobin 12.0 - 16.0 10.2(A) 11.4(A) 11.3(A)  Hematocrit 36 - 46 32(A) 35(A) 35(A)  Platelets 150 - 399 120(A) 135(A) 145(A)   CMP Latest Ref Rng & Units 09/19/2021 07/10/2021 04/09/2021  Glucose 70 - 99 mg/dL - - -  BUN 4 - _0 Creatinine 0.5 - 1.1 1.2(A) 1.1 1.1  Sodium 137 - 147 143 144 142  Potassium 3.4 - 5.3 4.1 4.4 4.5  Chloride 99 - 108 107 109(A) 105  CO2 13 - 22 25(A) 27(A) 24(A)  Calcium 8.7 - 10.7 8.8 9.0 9.0  Total Protein 6.5 - 8.1 g/dL - - -  Total Bilirubin 0.3 - 1.2 mg/dL - - -  Alkaline Phos 25 - 125 76 78 82  AST 13 - 35 _1 ALT 7 - 35 _2 No results found for: CEA1 / No results found for: CEA1 No results found for: PSA1 No results found for: DXA128 No results found for: NOM767  No results found for: TOTALPROTELP, ALBUMINELP, A1GS, A2GS, BETS, BETA2SER, GAMS, MSPIKE, SPEI Lab Results  Component Value Date   TIBC 358 07/10/2021   TIBC 305 12/18/2020   FERRITIN 29 09/19/2021   FERRITIN 59 07/10/2021   FERRITIN 198 12/18/2020   IRONPCTSAT 26 07/10/2021   IRONPCTSAT 15.4 12/18/2020   No results found for: LDH  STUDIES:  CUP PACEART REMOTE DEVICE CHECK  Result Date: 09/17/2021 ILR summary report received. Battery status OK. Normal device function. No new symptom, tachy, brady, or pause episodes. No new AF episodes. Monthly summary reports and ROV/PRN LA     HISTORY:   Past Medical History:  Diagnosis Date   Abdominal pain    Accelerating angina ([place]) 08/26/2019   Acute blood  loss anemia 12/14/2019   Acute cardioembolic stroke ([employer]) 3/54/6568   Allergic rhinitis 06/21/2014   Anxiety    Arthritis    Asthma    Atherosclerosis of arteries 08/24/2019   Bilateral edema of lower extremity    Body mass index (BMI) 25.0-25.9, adult 08/24/2019   Body mass index 29.0-29.9, adult 08/24/2019   CAD (coronary artery disease) 10/25/2019   Cellulitis of right leg    Chronic low back pain 01/27/2018   Combined hyperlipidemia 08/21/7515   Complication of anesthesia    difficulty waking   Congestive heart failure (CHF) ([place])    COPD (chronic obstructive pulmonary disease) ([place])    Coronary artery disease    Coronary artery disease involving native heart without angina pectoris    Dyspnea 02/09/2019   Dyspnea on exertion    Embolic stroke ([place]) 0/08/7492   Essential (primary) hypertension 02/20/2021   Essential thrombocythemia ([place]) 06/22/2014   Essential thrombocytosis ([place]) 06/22/2014   Fall at home, initial encounter 12/21/2020   Gastrointestinal bleeding 06/23/2018   GERD (gastroesophageal reflux disease)    GERD without esophagitis 06/21/2014   History of melanoma excision    left greast toe 2015   History of squamous cell carcinoma excision    face--  multiple excisions   History of subdural hematoma    2008   HTN (hypertension) 06/21/2014   Hypertension    Ischial bursitis, right 08/24/2019   Leucocytosis 12/14/2019   Leukocytosis 12/14/2019   Malignant melanoma of great toe ([place]) 08/24/2019   Melanoma in situ ([place]) 06/29/2014   Mixed hyperlipidemia 08/24/2019   Opioid dependence ([place]) 08/24/2019   OSA (obstructive sleep apnea)    intolerant   Pedal edema 08/24/2019   Pneumonia of both lungs due to infectious organism 12/20/2020   Renal insufficiency 08/24/2019   S/P CABG x 4 11/04/2019   Scoliosis (and kyphoscoliosis), idiopathic 01/27/2018   Skin cancer 08/24/2019   Solitary pulmonary nodule on lung CT 02/09/2019   Squamous cell carcinoma, scalp/neck 08/24/2019   Stroke ([place]) 12/21/2020    Stroke due to embolism ([place]) 11/25/2019   Subdural hematoma 12/20/2020   Swelling of limb 08/24/2019   Thrombocytosis    takes hydoxyurea--  MONITORED BY DR Hinton Rao [facility]   VIN III (vulvar intraepithelial neoplasia III)    Vitamin B 12 deficiency 08/24/2019   Vitamin D deficiency 08/24/2019   Wears dentures    UPPER AND LOWER PARTIAL   Wears glasses     Past Surgical History:  Procedure Laterality Date   ABDOMINAL HYSTERECTOMY  age 63   CARDIAC CATHETERIZATION  03/172021   CORONARY ARTERY BYPASS GRAFT N/A 11/04/2019   Procedure: CORONARY ARTERY BYPASS GRAFTING (CABG) x 4, with ENDOSCOPIC HARVESTING OF RIGHT GREATER SAPHENOUS VEIN.;  Surgeon: Gaye Pollack, MD;  Location: [facility] OR;  Service: Open Heart Surgery;  Laterality: N/A;   INTRAVASCULAR ULTRASOUND/IVUS N/A 11/03/2019   Procedure: Intravascular Ultrasound/IVUS;  Surgeon: Nelva Bush, MD;  Location: [facility] CV LAB;  Service: Cardiovascular;  Laterality: N/A;   KNEE ARTHROSCOPY Left 2004   LEFT HEART CATH AND CORONARY ANGIOGRAPHY N/A 11/03/2019   Procedure: LEFT HEART CATH AND CORONARY ANGIOGRAPHY;  Surgeon: Nelva Bush, MD;  Location: [facility] LAB;  Service: Cardiovascular;  Laterality: N/A;   LOOP RECORDER INSERTION N/A 11/30/2019   Procedure: LOOP RECORDER INSERTION;  Surgeon: Thompson Grayer, MD;  Location: [facility];  Service: Cardiovascular;  Laterality: N/A;   MELANOMA EXCISION  2015   left great toe   REPAIR PERONEAL TENDONS ANKLE  2004   SUBDURAL HEMATOMA EVACUATION VIA CRANIOTOMY  2008      week later  post-op  [facility]   TEE WITHOUT CARDIOVERSION N/A 11/04/2019   Procedure: TRANSESOPHAGEAL ECHOCARDIOGRAM (TEE);  Surgeon: Gaye Pollack, MD;  Location: [facility];  Service: Open Heart Surgery;  Laterality: N/A;   VULVECTOMY N/A 10/24/2015   Procedure: WIDE LOCAL EXCISION OF THE VULVA ;  Surgeon: Everitt Amber, MD;  Location: [facility];  Service: Gynecology;  Laterality:  N/A;    Family History  Problem Relation Age of Onset   Clotting disorder Mother    Hypertension Mother    Heart disease Mother    Arthritis Mother    Stroke Father    Hypertension Father    Heart disease Father    Arthritis Father    Emphysema Father    Asthma Father    Kidney failure Father    Hypertension Sister    Heart disease Sister    Arthritis Sister    Hypertension Brother    Heart disease Brother    Arthritis Brother     Social History:  reports that she has never smoked. She has never used smokeless tobacco. She reports that she does not drink alcohol and does not use drugs.The patient is alone today.  Allergies:  Allergies  Allergen Reactions   Cephalexin Hives   Penicillin G    Ciprofloxacin Other (See Comments)    Gi intolerance   Penicillins Rash    Childhood reaction Did it involve swelling of the face/tongue/throat, SOB, or low BP? No Did it involve sudden or severe rash/hives, skin peeling, or any reaction on the inside of your mouth or nose? No Did you need to seek medical attention at a hospital or doctor's office? No When did it last happen?      50 years ago If all above answers are NO, may proceed with cephalosporin use.     Current Medications: Current Outpatient Medications  Medication Sig Dispense Refill   ciprofloxacin (CIPRO) 500 MG tablet Take 1 tablet (500 mg total) by mouth 2 (two) times daily. 20 tablet 0   acetaminophen (TYLENOL) 325 MG tablet Take 1-2 tablets (325-650 mg total) by mouth every 4 (four) hours as needed for mild pain.     ammonium lactate (LAC-HYDRIN) 12 % lotion Apply 1 application topically as needed for dry skin.     Ascorbic Acid (VITAMIN C) 1000 MG tablet Take 1,000 mg by mouth daily.     aspirin EC 81 MG tablet Take 1 tablet (81 mg total) by mouth daily. Swallow whole. 90 tablet 3   atorvastatin (LIPITOR) 20 MG tablet Take 1 tablet (20 mg total) by mouth daily. 90 tablet 3   Calcium Carb-Cholecalciferol  (CALCIUM 600 + D PO) Take  1 tablet by mouth in the morning and at bedtime.      fluconazole (DIFLUCAN) 150 MG tablet Take 1 tablet (150 mg total) by mouth daily. 1 tablet 0   fluticasone (FLONASE) 50 MCG/ACT nasal spray Place 2 sprays into both nostrils at bedtime.      Magnesium (RA NATURAL MAGNESIUM) 250 MG TABS Take by mouth.     meclizine (ANTIVERT) 12.5 MG tablet Take 12.5 mg by mouth 2 (two) times daily as needed.     metoprolol tartrate (LOPRESSOR) 25 MG tablet TAKE 1 TABLET BY MOUTH TWICE A DAY 180 tablet 3   Multiple Vitamins-Minerals (PRESERVISION AREDS 2) CAPS Take 1 capsule by mouth in the morning and at bedtime.      omeprazole (PRILOSEC) 20 MG capsule Take 20 mg by mouth daily.     vitamin B-12 (CYANOCOBALAMIN) 500 MCG tablet Take 1 tablet (500 mcg total) by mouth daily.     Vitamin D, Ergocalciferol, (DRISDOL) 1.25 MG (50000 UT) CAPS capsule Take 50,000 Units by mouth every 14 (fourteen) days.     No current facility-administered medications for this visit.

## 2021-09-19 NOTE — Assessment & Plan Note (Addendum)
The patient was admitted in January with an esophageal tear.  Her hemoglobin dropped to from 8.3 to now I have to go inherently look 6.9 during admission, so she received 1 unit of packed red blood cells with improvement in her hemoglobin to 8.2. Iron studies during hospitalization did not reveal iron deficiency.  She has not been taking an oral iron supplement, as she states they told her to discuss with her PCP and Dr. Rica Records recommended we handle this.  Her hemoglobin is improving without oral iron supplementation.  Her ferritin was low normal at 29, so Dr. Hinton Rao recommended IV iron replacement, but the patient declined.  We will see her back in 1 month with a CBC.

## 2021-09-19 NOTE — Telephone Encounter (Signed)
Per 09/19/21 los next appt scheduled and confirmed with patient. °

## 2021-09-19 NOTE — Assessment & Plan Note (Addendum)
She has had this intermittently since 2018. She had worsening in May 2022 with abnormal differential and bone marrow was recommended, but she did not have this done as she was hospitalized with stroke. When we saw her for follow up in August, this had improved so we continued observation.  She had worsening leukocytosis during her hospitalization, likely reactive due to her acute illness.  It had improved prior to discharge, but she now has worsening leukocytosis again, likely due to urinary tract infection.  We will continue to monitor this.

## 2021-09-19 NOTE — Assessment & Plan Note (Addendum)
Hydroxyurea was discontinued in October 2021 due to low normal platelets.  She now has mild thrombocytopenia.  We will continue to monitor this.

## 2021-09-21 ENCOUNTER — Other Ambulatory Visit: Payer: Self-pay | Admitting: Hematology and Oncology

## 2021-09-21 MED ORDER — FLUCONAZOLE 150 MG PO TABS: 150.0000 mg | ORAL_TABLET | Freq: Every day | ORAL | 0 refills | Status: DC

## 2021-09-25 ENCOUNTER — Other Ambulatory Visit: Payer: Self-pay

## 2021-09-25 DIAGNOSIS — D75839 Thrombocytosis, unspecified: Secondary | ICD-10-CM | POA: Insufficient documentation

## 2021-09-25 NOTE — Progress Notes (Signed)
Carelink Summary Report / Loop Recorder 

## 2021-09-27 ENCOUNTER — Ambulatory Visit: Payer: PPO

## 2021-10-01 ENCOUNTER — Other Ambulatory Visit: Payer: Self-pay

## 2021-10-01 ENCOUNTER — Encounter: Payer: Self-pay | Admitting: Cardiology

## 2021-10-01 ENCOUNTER — Ambulatory Visit: Payer: PPO | Admitting: Cardiology

## 2021-10-01 VITALS — BP 158/80 | HR 66 | Ht 65.0 in | Wt 170.4 lb

## 2021-10-01 DIAGNOSIS — I2510 Atherosclerotic heart disease of native coronary artery without angina pectoris: Secondary | ICD-10-CM

## 2021-10-01 DIAGNOSIS — I10 Essential (primary) hypertension: Secondary | ICD-10-CM

## 2021-10-01 DIAGNOSIS — E782 Mixed hyperlipidemia: Secondary | ICD-10-CM | POA: Diagnosis not present

## 2021-10-01 NOTE — Patient Instructions (Signed)

## 2021-10-01 NOTE — Progress Notes (Signed)
Cardiology Office Note:    Date:  10/01/2021   ID:  Yolanda Flowers, DOB [birth year], MRN [id number]  PCP:  [name], MD  Cardiologist:  Jenean Lindau, MD   Referring MD: [name], MD    ASSESSMENT:    1. Coronary artery disease involving native coronary artery of native heart without angina pectoris   2. Mixed hyperlipidemia   3. Primary hypertension    PLAN:    In order of problems listed above:  Coronary artery disease: Secondary prevention stressed with the patient.  Importance of compliance with diet and medication stressed she was placed on standing.  She was advised to walk to the best of her ability on a regular basis. Essential hypertension: Blood pressure stable and diet was emphasized.  Lifestyle modification was advised.  She tells me that her blood pressures are better at home and she has an element of whitecoat hypertension. Mixed dyslipidemia: Lipids were reviewed and diet was emphasized. History of GI bleeding followed by primary care and hematology.  She also has history of thrombocytopenia and this is managed by hematologist.  Currently these appear to be stable clinically. Patient will be seen in follow-up appointment in 9 months or earlier if the patient has any concerns    Medication Adjustments/Labs and Tests Ordered: Current medicines are reviewed at length with the patient today.  Concerns regarding medicines are outlined above.  No orders of the defined types were placed in this encounter.  No orders of the defined types were placed in this encounter.    No chief complaint on file.    History of Present Illness:    Yolanda Flowers is a 81 y.o. female.  Patient has past medical history of coronary artery disease, essential hypertension, mixed dyslipidemia and history of thrombocytosis.  Subsequently she has had bypass surgery and tells me that her heart is now fine and she has no issues with the hand her platelet count is on the lower side.   This is monitored by hematologist.  She denies any chest pain orthopnea or PND.  She has had GI bleeding and underwent esophageal endoscopy and procedure for "stapling" and now she has had no bleeding.  Her stools are not dark-colored and she has no issues at this time.  She is here for a routine follow-up.  She mentions to me that blood pressures at home are fine.  Past Medical History:  Diagnosis Date   Abdominal pain    Accelerating angina ([place]) 08/26/2019   Acute blood loss anemia 12/14/2019   Acute cardioembolic stroke ([place]) 48/25/0037   Allergic rhinitis 06/21/2014   Anxiety    Arthritis    Asthma    Atherosclerosis of arteries 08/24/2019   Bilateral edema of lower extremity    Body mass index (BMI) 25.0-25.9, adult 08/24/2019   CAD (coronary artery disease) 10/25/2019   Cellulitis of right leg    Chronic low back pain 01/27/2018   Combined hyperlipidemia 04/88/8916   Complication of anesthesia    difficulty waking   Congestive heart failure (CHF) ([place])    COPD (chronic obstructive pulmonary disease) ([place])    Coronary artery disease    Coronary artery disease involving native heart without angina pectoris    Dyspnea 02/09/2019   Dyspnea on exertion    Dysuria 04/22/5037   Embolic stroke ([place]) 88/28/0034   Essential (primary) hypertension 02/20/2021   Essential thrombocythemia ([place]) 06/22/2014   Essential thrombocytosis ([place]) 06/22/2014   Fall at home, initial encounter  12/21/2020   Gastrointestinal bleeding 06/23/2018   GERD (gastroesophageal reflux disease)    GERD without esophagitis 06/21/2014   History of melanoma excision    left greast toe 2015   History of squamous cell carcinoma excision    face--  multiple excisions   History of subdural hematoma    2008   HTN (hypertension) 06/21/2014   Hypertension    Ischial bursitis, right 08/24/2019   Leucocytosis 12/14/2019   Leukocytosis 12/14/2019   Malignant melanoma of great toe ([place]) 08/24/2019   Melanoma in  situ ([place]) 06/29/2014   Mixed hyperlipidemia 08/24/2019   Opioid dependence ([place]) 08/24/2019   OSA (obstructive sleep apnea)    intolerant   Pedal edema 08/24/2019   Pneumonia of both lungs due to infectious organism 12/20/2020   Renal insufficiency 08/24/2019   S/P CABG x 4 11/04/2019   Scoliosis (and kyphoscoliosis), idiopathic 01/27/2018   Skin cancer 08/24/2019   Solitary pulmonary nodule on lung CT 02/09/2019   Squamous cell carcinoma, scalp/neck 08/24/2019   Stroke ([place]) 12/21/2020   Stroke due to embolism ([place]) 11/25/2019   Subdural hematoma 12/20/2020   Swelling of limb 08/24/2019   Thrombocytosis    takes hydoxyurea--  MONITORED BY DR Hinton Rao [facility])   Traumatic subdural hemorrhage with loss of consciousness of unspecified duration, initial encounter ([place]) 02/20/2021   VIN III (vulvar intraepithelial neoplasia III)    Vitamin B 12 deficiency 08/24/2019   Vitamin D deficiency 08/24/2019   Wears dentures    UPPER AND LOWER PARTIAL   Wears glasses     Past Surgical History:  Procedure Laterality Date   ABDOMINAL HYSTERECTOMY  age 42   CARDIAC CATHETERIZATION  03/172021   CORONARY ARTERY BYPASS GRAFT N/A 11/04/2019   Procedure: CORONARY ARTERY BYPASS GRAFTING (CABG) x 4, with ENDOSCOPIC HARVESTING OF RIGHT GREATER SAPHENOUS VEIN.;  Surgeon: Gaye Pollack, MD;  Location: [facility];  Service: Open Heart Surgery;  Laterality: N/A;   INTRAVASCULAR ULTRASOUND/IVUS N/A 11/03/2019   Procedure: Intravascular Ultrasound/IVUS;  Surgeon: Nelva Bush, MD;  Location: [facility];  Service: Cardiovascular;  Laterality: N/A;   KNEE ARTHROSCOPY Left 2004   LEFT HEART CATH AND CORONARY ANGIOGRAPHY N/A 11/03/2019   Procedure: LEFT HEART CATH AND CORONARY ANGIOGRAPHY;  Surgeon: Nelva Bush, MD;  Location: [facility];  Service: Cardiovascular;  Laterality: N/A;   LOOP RECORDER INSERTION N/A 11/30/2019   Procedure: LOOP RECORDER INSERTION;  Surgeon: Thompson Grayer, MD;  Location: [facility];  Service: Cardiovascular;  Laterality: N/A;   MELANOMA EXCISION  2015   left great toe   REPAIR PERONEAL TENDONS ANKLE  2004   SUBDURAL HEMATOMA EVACUATION VIA CRANIOTOMY  2008      week later  post-op  [facility]   TEE WITHOUT CARDIOVERSION N/A 11/04/2019   Procedure: TRANSESOPHAGEAL ECHOCARDIOGRAM (TEE);  Surgeon: Gaye Pollack, MD;  Location: [facility];  Service: Open Heart Surgery;  Laterality: N/A;   VULVECTOMY N/A 10/24/2015   Procedure: WIDE LOCAL EXCISION OF THE VULVA ;  Surgeon: Everitt Amber, MD;  Location: [facility];  Service: Gynecology;  Laterality: N/A;    Current Medications: Current Meds  Medication Sig   acetaminophen (TYLENOL) 325 MG tablet Take 1-2 tablets (325-650 mg total) by mouth every 4 (four) hours as needed for mild pain.   ammonium lactate (LAC-HYDRIN) 12 % lotion Apply 1 application topically as needed for dry skin.   Ascorbic Acid (VITAMIN C) 1000 MG tablet  Take 1,000 mg by mouth daily.   aspirin EC 81 MG tablet Take 1 tablet (81 mg total) by mouth daily. Swallow whole.   atorvastatin (LIPITOR) 20 MG tablet Take 20 mg by mouth daily.   Calcium Carb-Cholecalciferol (CALCIUM 600 + D PO) Take 1 tablet by mouth in the morning and at bedtime.    fluticasone (FLONASE) 50 MCG/ACT nasal spray Place 2 sprays into both nostrils at bedtime.    Magnesium 250 MG TABS Take 250 mg by mouth daily.   meclizine (ANTIVERT) 12.5 MG tablet Take 12.5 mg by mouth 2 (two) times daily as needed for dizziness.   metoprolol tartrate (LOPRESSOR) 25 MG tablet TAKE 1 TABLET BY MOUTH TWICE A DAY   Multiple Vitamins-Minerals (PRESERVISION AREDS 2) CAPS Take 1 capsule by mouth in the morning and at bedtime.    omeprazole (PRILOSEC) 20 MG capsule Take 20 mg by mouth daily.   vitamin B-12 (CYANOCOBALAMIN) 500 MCG tablet Take 1 tablet (500 mcg total) by mouth daily.   Vitamin D, Ergocalciferol, (DRISDOL) 1.25 MG (50000 UT) CAPS capsule  Take 50,000 Units by mouth every 14 (fourteen) days.     Allergies:   Cephalexin, Penicillin g, Ciprofloxacin, and Penicillins   Social History   Socioeconomic History   Marital status: Married    Spouse name: Not on file   Number of children: Not on file   Years of education: Not on file   Highest education level: Not on file  Occupational History   Not on file  Tobacco Use   Smoking status: Never   Smokeless tobacco: Never  Vaping Use   Vaping Use: Never used  Substance and Sexual Activity   Alcohol use: No   Drug use: No   Sexual activity: Not Currently  Other Topics Concern   Not on file  Social History Narrative   Not on file   Social Determinants of Health   Financial Resource Strain: Not on file  Food Insecurity: Not on file  Transportation Needs: Not on file  Physical Activity: Not on file  Stress: Not on file  Social Connections: Not on file     Family History: The patient's family history includes Arthritis in her brother, father, mother, and sister; Asthma in her father; Clotting disorder in her mother; Emphysema in her father; Heart disease in her brother, father, mother, and sister; Hypertension in her brother, father, mother, and sister; Kidney failure in her father; Stroke in her father.  ROS:   Please see the history of present illness.    All other systems reviewed and are negative.  EKGs/Labs/Other Studies Reviewed:    The following studies were reviewed today: I discussed my findings with the patient at length.  Recent blood work was also noted.  On   Recent Labs: 12/20/2020: B Natriuretic Peptide 400.9; TSH 1.211 12/22/2020: Magnesium 2.3 09/19/2021: ALT 21; BUN 19; Creatinine 1.2; Hemoglobin 10.2; Platelets 120; Potassium 4.1; Sodium 143  Recent Lipid Panel    Component Value Date/Time   CHOL 95 03/30/2021 0000   CHOL 105 09/26/2020 0908   TRIG 160 03/30/2021 0000   HDL 32 (A) 03/30/2021 0000   HDL 33 (L) 09/26/2020 0908   CHOLHDL 2.6  12/21/2020 0236   VLDL 21 12/21/2020 0236   LDLCALC 36 03/30/2021 0000   LDLCALC 41 09/26/2020 0908    Physical Exam:    VS:  BP (!) 158/80    Pulse 66    Ht 5\' 5"  (1.651 m)  Wt 170 lb 6.4 oz (77.3 kg)    BMI 28.36 kg/m     Wt Readings from Last 3 Encounters:  10/01/21 170 lb 6.4 oz (77.3 kg)  09/19/21 165 lb 1.6 oz (74.9 kg)  07/10/21 165 lb 1.6 oz (74.9 kg)     GEN: Patient is in no acute distress HEENT: Normal NECK: No JVD; No carotid bruits LYMPHATICS: No lymphadenopathy CARDIAC: Hear sounds regular, 2/6 systolic murmur at the apex. RESPIRATORY:  Clear to auscultation without rales, wheezing or rhonchi  ABDOMEN: Soft, non-tender, non-distended MUSCULOSKELETAL:  No edema; No deformity  SKIN: Warm and dry NEUROLOGIC:  Alert and oriented x 3 PSYCHIATRIC:  Normal affect   Signed, Jenean Lindau, MD  10/01/2021 9:15 AM    [place]

## 2021-10-10 ENCOUNTER — Other Ambulatory Visit: Payer: PPO

## 2021-10-10 ENCOUNTER — Ambulatory Visit: Payer: PPO | Admitting: Oncology

## 2021-10-10 DIAGNOSIS — R159 Full incontinence of feces: Secondary | ICD-10-CM | POA: Diagnosis not present

## 2021-10-10 DIAGNOSIS — D649 Anemia, unspecified: Secondary | ICD-10-CM | POA: Diagnosis not present

## 2021-10-10 DIAGNOSIS — K222 Esophageal obstruction: Secondary | ICD-10-CM | POA: Diagnosis not present

## 2021-10-10 DIAGNOSIS — K226 Gastro-esophageal laceration-hemorrhage syndrome: Secondary | ICD-10-CM | POA: Diagnosis not present

## 2021-10-10 DIAGNOSIS — K648 Other hemorrhoids: Secondary | ICD-10-CM | POA: Diagnosis not present

## 2021-10-10 DIAGNOSIS — K5792 Diverticulitis of intestine, part unspecified, without perforation or abscess without bleeding: Secondary | ICD-10-CM | POA: Diagnosis not present

## 2021-10-10 DIAGNOSIS — K5790 Diverticulosis of intestine, part unspecified, without perforation or abscess without bleeding: Secondary | ICD-10-CM | POA: Diagnosis not present

## 2021-10-11 NOTE — Progress Notes (Signed)
[name]  [address] [place],  [place]  [address] [phone]  Clinic Day:  10/17/2021  Referring physician: [name], MD  This document serves as a record of services personally performed by Yolanda Poisson, MD. It was created on their behalf by Curry,Lauren E, a trained medical scribe. The creation of this record is based on the scribe's personal observations and the provider's statements to them.  ASSESSMENT & PLAN:   Assessment & Plan: 1. Essential thrombocythemia previously treated with hydroxyurea.  Due to low normal platelets, hydroxyurea has been stopped since October 2021.  The platelets stable.  We are watching her for any signs of transformation to myelofibrosis or acute leukemia.   2. Leukocytosis/neutrophilia, which she has had on and off since 2018.  This is felt to be due to her myeloproliferative disease, and has improved over the past few months.  We will continue to monitor.   3. Anemia, improved. She had a recent gastrointestinal hemorrhage in January and so Dr. Althea Charon has added repeat iron studies, B12 and folate. Repeat nutritional studies in May 2022 revealed B12 to be low normal, so we had her start an oral B12 supplement.  Iron studies and folate were normal.    4. History of vulvar cancer, treated with surgical excision.   5. Right upper lobe nodule on CT chest seen.  Pulmonology is following, but CT imaging in July 2021 revealed stable pulmonary nodules.   6. History of melanoma of the great toe, treated with surgical excision.  She remains without evidence of recurrence   7. Chronic back pain, which is being managed with narcotics by Dr. Maryjean Ka.    8. Status post CABG x4 in March 2021.   9. Status post CVA x2 in March and April 2021.  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   10. Calcifications in the right breast in March 2021 warranting further  evaluation.  Unilateral diagnostic mammogram from July characterized these as likely benign. She is overdue for bilateral diagnostic mammogram at this time.  I recommended that she have it, even at the age of 47, since breast cancer risk increases with age. She is willing to do so, and so I will get that scheduled.    11.  CVA May 2022.  She has recovered but still has some extremity weakness of the right hand.  12.  Acute blood loss anemia, January 2023. The patient was admitted with an esophageal tear.  Her hemoglobin dropped to 6.9 during admission, and so she received 1 unit of packed red blood cells with improvement in her hemoglobin to 8.2. She was up to 10.2 when she was seen on February 1st and now 11.4, so improving nicely. She denies any further evidence of overt bleeding.    Her hemoglobin continues to improve and she is doing well. As requested, we will add a TSH and T4 to her lab work and will send the results to Dr. Rica Records. Nutritional studies with iron, TIBC, ferritin, B12 and folate have also been ordered. We will schedule her for bilateral screening mammogram. We will plan to see her back in 2 months with CBC for repeat evaluation. The patient understands the plans discussed today and is in agreement with them.  ADDENDUM: I did review her blood smear and I see occasional myelocytes, occasional nucleated RBC's, poikilocytosis, and 1 promyelocyte.   I provided 15 minutes of face-to-face time during this encounter  and > 50% was spent counseling as documented under my assessment and plan.    [name] [address] [place] [address] Dept: [phone] Dept Fax: [phone]   No orders of the defined types were placed in this encounter.     CHIEF COMPLAINT:  CC: Acute blood loss anemia  Current Treatment: None  HISTORY OF PRESENT ILLNESS:  Yolanda Flowers is a 81 year old female with a  history of essential thrombocythemia originally diagnosed in 81.  She was treated with hydroxyurea 500 mg 3 times daily.  We have had to occasionally adjust doses because of leukopenia or anemia.  She also had a malignant melanoma of her left first toe resected in November 2015 with partial amputation of the toe and has done well.  She has had multiple other skin cancers removed over the years.  In March 2017, she was seen by Dr. Denman George for HSIL/VIN 3 of the right posterior labia minora and underwent vulvectomy.  Hydroxyurea was decreased to 500 mg twice daily prior to surgery, as we did not feel we could safely hold hydroxyurea.  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  The patient had been seeing the gynecologist every 6 months, but has not continued regular follow-ups there. The platelet count increased in May 2017 to 657,000, so her dose of hydroxyurea was increased to 3 times daily, then subsequently decreased to twice a day due to leukopenia and anemia.  Her platelets had remained between under 500,000 since August 2018.  Bone density scan in March 2018 revealed osteopenia with a T-score of -1.6 in the femur, for which she is on calcium and vitamin-D.  She has had chronic back pain, which is attributed to severe degenerative disease.  She had an MRI of the lumbar spine in March 2018, which revealed significant degenerative disc disease with mild spinal stenosis at L1 and L4, but moderate at L2-3.  Dr. Maryjean Ka has given her injections, as well at hydrocodone/APAP 5/325.  She has had intermittent leukocytosis since 2018, felt to be secondary to her essential thrombocythemia.   She was evaluated in the emergency department in October 2019 and had a GI bleed. CT abdomen and pelvis revealed acute uncomplicated diverticulitis of the sigmoid colon with no other acute abnormalities.  A moderate to large hiatal hernia  was seen.  No focal hepatic lesions or intrahepatic biliary dilatation were seen.  She saw Dr. Geraldo Pitter and underwent nuclear med stress test in November, which did not reveal any evidence of inducible ischemia or other abnormality.  Left ventricular size and function was normal with an ejection fraction of 79%.  She states she was placed on Spiriva and Ventolin.  She underwent EGD and colonoscopy in December  2019, and no significant source of blood loss was identified.  Repeat iron studies, B12 and folate did not reveal any nutritional deficiency  She was instructed to continue lansoprazole.  She had dilatation of an esophageal stricture and removal of a polyp. Pathology revealed a fragmented tubular adenoma.    In January 2020, she had worsening anemia, in addition to continued episodes of chest pain and worsening dyspnea with exertion at her routine follow-up. Due to the severe dyspnea and right lower extremity edema, we obtained a CT angiogram chest and venous Doppler ultrasound of the right lower extremity. CTA chest did not reveal any evidence of  pulmonary embolism.  There was good opacification of the pulmonary arteries.  Diffuse coronary artery calcifications with cardiomegaly and moderate thoracic aortic atherosclerosis was seen.  There was a 3.6 mm noncalcified nodule in the anterior inferior right upper lobe.  Non-contrasted CT chest in 6-12 months was recommended.  Right lower extremity venous Doppler ultrasound did not reveal any evidence of deep venous thrombosis.  Further evaluation of the anemia did not reveal evidence of hemolysis or monoclonal gammopathy. In February 2020, she had persistent anemia with a hemoglobin of 9.7.  As she was symptomatic, hydroxyurea 500 mg was decreased to once daily.  Soluble transferrin was elevated, which was consistent with iron deficiency.  She saw Dr. Lyda Jester and he placed her on iron supplementation daily in the form of ferrous sulfate 65 mg. She was seen  again in March and had improvement in her anemia with a hemoglobin of 10.2.  Her thrombocythemia remained controlled on hydroxyurea 500 mg daily. We encouraged her to have follow-up for her vulvar cancer, so followed up with Dr. Carlena Bjornstad and her exam was normal.   He did not feel he needed to check her again for 2 years unless she had problems.  Due to chronic dyspnea, she underwent multiple tests at [facility].  Apparently, she did not have evidence of COPD.  She was therefore referred to Cardiology.   She saw Dr. Geraldo Pitter and was undergoing cardiac evaluation when we saw her in January 2021.  She had a bilateral screening mammogram in February 2021, which revealed an area of calcifications in the right breast warranting further evaluation.  A diagnostic right mammogram was ordered, but this was delayed, because she underwent the open heart surgery with CABG x4 in March.  She had a monitor placed in the left chest wall the time of surgery.  She did not feel she can have mammogram due to the monitor in the left chest wall.  She then had 2 strokes following her surgery, from which she has recovered well, except for balance issues and left eye blindness.  She ambulates with a cane or walker depending on her circumstances.  She is on clopidogrel 75 mg daily, atorvastatin 80 mg daily, metoprolol 25 mg twice daily and pantoprazole 40 mg daily.  She had a venous Doppler ultrasound of the right lower extremity in March for swelling postoperatively which did not reveal any evidence of deep venous thrombosis.  She had a CT chest in July to follow-up on the pulmonary nodule which were stable. She finally underwent diagnostic right mammogram from July which revealed likely benign 6 mm group of calcifications involving the upper inner quadrant at middle depth. Bilateral diagnostic mammogram in February 2022 was recommended     At her visit in October 2021, she reported elevated transaminases and purpura, which was  attributed to atorvastatin.  Dr. Geraldo Pitter reduced the dose from 80 mg to 40 mg with normalization of the transaminases and no further purpura.  Her platelets were in low normal range, so hydroxyurea was held and since her platelets have remained normal, hydroxyurea was discontinued. She intermittent mild leukocytosis/neutrophilia since 2018.  She has occasionally had immature cells on her blood smear, but not blasts.   When I saw her in May, she had worsening leukocytosis with persistent immature cells and worsening anemia, so recommended a bone marrow biopsy, but this was never done as the patient was hospitalized with a stroke and discharged to rehab. She was seen again in August, at which time  her white count had decreased from 24.2 to 19.9 with an [place] of 1473, with 11% lymphocytes, 10% monocytes and 2.4 % basophils and her hemoglobin had improved from 10.6 to 11.3. The platelet count remained normal, so continued observation was recommended.  Her blood counts were stable in November.  She was admitted on January 20th with a 3 day history of sensation of an object stuck in her throat following attempted swallowing of calcium supplementation. She was eventually able to dislodge item after several hours.  She then developed stool that she described as "motor oil" for 2 days.  She was evaluated in the emergency room and was found to have hemoglobin of 8.3 decreased from 11.4 in November 2022.   She underwent urgent EGD and was found to have a 3 cm mucosal tear within the distal 5 cm of her esophagus, felt to be most likely secondary to mucosal tear following calcium carbonate impaction. CT neck and chest was performed to evaluate for possibility of mediastinitis developing following possible full focus mucosal tear. This imaging did not reveal active inflammatory changes. 3 clips were placed closing the tear successfully and complete hemostasis was achieved.  She was placed on twice daily pantoprazole with slow  advancement of diet from clear liquid to house select over 2 days.  She had an acute drop in hemoglobin to 6.9 following further equilibration, so received 1 unit PRBC with appropriate response to 8.2 g/dL. Her hemoglobin remained at approximately 8 for the remainder of admission.  She was instructed to take iron supplementation while at home and to avoid calcium carbonate for time being and to transition to over-the-counter chewable supplementation.  Iron/TIBC did not reveal iron deficiency.  At hospital discharge, follow-up with her primary care provider was recommended for repeat ultrasound of thyroid to evaluate cystic nodule,  repeat noncontrast CT chest in 3 to 6 months, approximately April to July 2023 to monitor 75 mm pulmonary nodules and consideration of resumption of metoprolol following after longer interval after GI bleed. She is not willing to take the pantoprazole that was prescribed.  INTERVAL HISTORY:  Yolanda Flowers is here for routine follow up and remains off hydroxyurea as advised. She states that she is doing well and denies complaints other than back pain. She rates this as a 7/10 today. She has been supplementing with Ensure daily. White count has decreased from 24.1 to 16.3, hemoglobin has improved from 10.2 to 11.4, and platelets remain stable at 123,000. Chemistries are unremarkable. Her  appetite is good, and she has gained 1 pound since her last visit.  She denies fever, chills or other signs of infection.  She denies nausea, vomiting, bowel issues, or abdominal pain.  She denies sore throat, cough, dyspnea, or chest pain.  REVIEW OF SYSTEMS:  Review of Systems  Constitutional: Negative.  Negative for appetite change, chills, fatigue, fever and unexpected weight change.  HENT:  Negative.    Eyes: Negative.   Respiratory: Negative.  Negative for chest tightness, cough, hemoptysis, shortness of breath and wheezing.   Cardiovascular: Negative.  Negative for chest pain, leg swelling and  palpitations.  Gastrointestinal: Negative.  Negative for abdominal distention, abdominal pain, blood in stool, constipation, diarrhea, nausea and vomiting.  Endocrine: Negative.   Genitourinary: Negative.  Negative for difficulty urinating, dysuria, frequency and hematuria.   Musculoskeletal:  Positive for back pain (chronic). Negative for arthralgias, flank pain, gait problem and myalgias.  Skin: Negative.   Neurological: Negative.  Negative for dizziness, extremity weakness, gait problem,  headaches, light-headedness, numbness, seizures and speech difficulty.  Hematological: Negative.   Psychiatric/Behavioral: Negative.  Negative for depression and sleep disturbance. The patient is not nervous/anxious.     VITALS:  Blood pressure (!) 156/75, pulse (!) 59, temperature 98.3 F (36.8 C), temperature source Oral, resp. rate 18, height '5\' 5"'  (1.651 m), weight 166 lb 1.6 oz (75.3 kg), SpO2 98 %.  Wt Readings from Last 3 Encounters:  10/17/21 166 lb 1.6 oz (75.3 kg)  10/01/21 170 lb 6.4 oz (77.3 kg)  09/19/21 165 lb 1.6 oz (74.9 kg)    Body mass index is 27.64 kg/m.  Performance status (ECOG): 1 - Symptomatic but completely ambulatory  PHYSICAL EXAM:  Physical Exam Constitutional:      General: She is not in acute distress.    Appearance: Normal appearance. She is normal weight.  HENT:     Head: Normocephalic and atraumatic.  Eyes:     General: No scleral icterus.    Extraocular Movements: Extraocular movements intact.     Conjunctiva/sclera: Conjunctivae normal.     Pupils: Pupils are equal, round, and reactive to light.  Cardiovascular:     Rate and Rhythm: Regular rhythm. Bradycardia present.     Pulses: Normal pulses.     Heart sounds: Normal heart sounds. No murmur heard.   No friction rub. No gallop.  Pulmonary:     Effort: Pulmonary effort is normal. No respiratory distress.     Breath sounds: Normal breath sounds.  Chest:  Breasts:    Right: Inverted nipple present.      Left: Inverted nipple present.     Comments: Loop recorder upper inner quadrant of the right breast. Both breasts are without masses. She has a deep sternotomy scar with a suture at the top portion. Abdominal:     General: Bowel sounds are normal. There is no distension.     Palpations: Abdomen is soft. There is no hepatomegaly, splenomegaly or mass.     Tenderness: There is no abdominal tenderness.  Musculoskeletal:        General: Normal range of motion.     Cervical back: Normal range of motion and neck supple.     Right lower leg: Edema (mild) present.     Left lower leg: No edema.  Lymphadenopathy:     Cervical: No cervical adenopathy.  Skin:    General: Skin is warm and dry.  Neurological:     General: No focal deficit present.     Mental Status: She is alert and oriented to person, place, and time. Mental status is at baseline.  Psychiatric:        Mood and Affect: Mood normal.        Behavior: Behavior normal.        Thought Content: Thought content normal.        Judgment: Judgment normal.    LABS:   CBC Latest Ref Rng & Units 10/17/2021 09/19/2021 07/10/2021  WBC - 16.3 24.1 19.4  Hemoglobin 12.0 - 16.0 11.4(A) 10.2(A) 11.4(A)  Hematocrit 36 - 46 35(A) 32(A) 35(A)  Platelets 150 - 399 123(A) 120(A) 135(A)   CMP Latest Ref Rng & Units 10/17/2021 09/19/2021 07/10/2021  Glucose 70 - 99 mg/dL - - -  BUN 4 - '21 17 19 17  ' Creatinine 0.5 - 1.1 1.0 1.2(A) 1.1  Sodium 137 - 147 143 143 144  Potassium 3.4 - 5.3 4.5 4.1 4.4  Chloride 99 - 108 108 107 109(A)  CO2 13 - 22  27(A) 25(A) 27(A)  Calcium 8.7 - 10.7 8.7 8.8 9.0  Total Protein 6.5 - 8.1 g/dL - - -  Total Bilirubin 0.3 - 1.2 mg/dL - - -  Alkaline Phos 25 - 125 80 76 78  AST 13 - 35 '30 31 31  ' ALT 7 - 35 '22 21 23    ' Lab Results  Component Value Date   TIBC 358 07/10/2021   TIBC 305 12/18/2020   FERRITIN 29 09/19/2021   FERRITIN 59 07/10/2021   FERRITIN 198 12/18/2020   IRONPCTSAT 26 07/10/2021   IRONPCTSAT 15.4  12/18/2020   No results found for: LDH  STUDIES:  No results found.    HISTORY:   Allergies:  Allergies  Allergen Reactions   Cephalexin Hives   Penicillin G    Ciprofloxacin Other (See Comments)    Gi intolerance   Penicillins Rash    Childhood reaction Did it involve swelling of the face/tongue/throat, SOB, or low BP? No Did it involve sudden or severe rash/hives, skin peeling, or any reaction on the inside of your mouth or nose? No Did you need to seek medical attention at a hospital or doctor's office? No When did it last happen?      50 years ago If all above answers are NO, may proceed with cephalosporin use.     Current Medications: Current Outpatient Medications  Medication Sig Dispense Refill   acetaminophen (TYLENOL) 325 MG tablet Take 1-2 tablets (325-650 mg total) by mouth every 4 (four) hours as needed for mild pain.     ammonium lactate (LAC-HYDRIN) 12 % lotion Apply 1 application topically as needed for dry skin.     Ascorbic Acid (VITAMIN C) 1000 MG tablet Take 1,000 mg by mouth daily.     aspirin EC 81 MG tablet Take 1 tablet (81 mg total) by mouth daily. Swallow whole. 90 tablet 3   atorvastatin (LIPITOR) 20 MG tablet Take 20 mg by mouth daily.     Calcium Carb-Cholecalciferol (CALCIUM 600 + D PO) Take 1 tablet by mouth in the morning and at bedtime.      fluticasone (FLONASE) 50 MCG/ACT nasal spray Place 2 sprays into both nostrils at bedtime.      Magnesium 250 MG TABS Take 250 mg by mouth daily.     meclizine (ANTIVERT) 12.5 MG tablet Take 12.5 mg by mouth 2 (two) times daily as needed for dizziness.     metoprolol tartrate (LOPRESSOR) 25 MG tablet TAKE 1 TABLET BY MOUTH TWICE A DAY 180 tablet 3   Multiple Vitamins-Minerals (PRESERVISION AREDS 2) CAPS Take 1 capsule by mouth in the morning and at bedtime.      omeprazole (PRILOSEC) 20 MG capsule Take 20 mg by mouth daily.     vitamin B-12 (CYANOCOBALAMIN) 500 MCG tablet Take 1 tablet (500 mcg total) by  mouth daily.     Vitamin D, Ergocalciferol, (DRISDOL) 1.25 MG (50000 UT) CAPS capsule Take 50,000 Units by mouth every 14 (fourteen) days.     No current facility-administered medications for this visit.    I, Rita Ohara, am acting as scribe for Derwood Kaplan, MD  I have reviewed this report as typed by the medical scribe, and it is complete and accurate.

## 2021-10-17 ENCOUNTER — Other Ambulatory Visit: Payer: Self-pay | Admitting: Oncology

## 2021-10-17 ENCOUNTER — Encounter: Payer: Self-pay | Admitting: Oncology

## 2021-10-17 ENCOUNTER — Inpatient Hospital Stay: Payer: PPO | Attending: Hematology and Oncology | Admitting: Oncology

## 2021-10-17 ENCOUNTER — Other Ambulatory Visit: Payer: Self-pay

## 2021-10-17 ENCOUNTER — Inpatient Hospital Stay: Payer: PPO

## 2021-10-17 VITALS — BP 156/75 | HR 59 | Temp 98.3°F | Resp 18 | Ht 65.0 in | Wt 166.1 lb

## 2021-10-17 DIAGNOSIS — Z8673 Personal history of transient ischemic attack (TIA), and cerebral infarction without residual deficits: Secondary | ICD-10-CM | POA: Insufficient documentation

## 2021-10-17 DIAGNOSIS — Z7982 Long term (current) use of aspirin: Secondary | ICD-10-CM | POA: Insufficient documentation

## 2021-10-17 DIAGNOSIS — Z79899 Other long term (current) drug therapy: Secondary | ICD-10-CM | POA: Insufficient documentation

## 2021-10-17 DIAGNOSIS — Z1239 Encounter for other screening for malignant neoplasm of breast: Secondary | ICD-10-CM

## 2021-10-17 DIAGNOSIS — R7401 Elevation of levels of liver transaminase levels: Secondary | ICD-10-CM | POA: Diagnosis not present

## 2021-10-17 DIAGNOSIS — D75839 Thrombocytosis, unspecified: Secondary | ICD-10-CM | POA: Insufficient documentation

## 2021-10-17 DIAGNOSIS — Z85820 Personal history of malignant melanoma of skin: Secondary | ICD-10-CM | POA: Diagnosis not present

## 2021-10-17 DIAGNOSIS — D473 Essential (hemorrhagic) thrombocythemia: Secondary | ICD-10-CM

## 2021-10-17 DIAGNOSIS — D62 Acute posthemorrhagic anemia: Secondary | ICD-10-CM | POA: Diagnosis not present

## 2021-10-17 DIAGNOSIS — R600 Localized edema: Secondary | ICD-10-CM

## 2021-10-17 DIAGNOSIS — M549 Dorsalgia, unspecified: Secondary | ICD-10-CM | POA: Insufficient documentation

## 2021-10-17 DIAGNOSIS — K449 Diaphragmatic hernia without obstruction or gangrene: Secondary | ICD-10-CM | POA: Diagnosis not present

## 2021-10-17 DIAGNOSIS — D72829 Elevated white blood cell count, unspecified: Secondary | ICD-10-CM | POA: Insufficient documentation

## 2021-10-17 DIAGNOSIS — D510 Vitamin B12 deficiency anemia due to intrinsic factor deficiency: Secondary | ICD-10-CM | POA: Diagnosis not present

## 2021-10-17 DIAGNOSIS — D649 Anemia, unspecified: Secondary | ICD-10-CM | POA: Diagnosis not present

## 2021-10-17 DIAGNOSIS — D471 Chronic myeloproliferative disease: Secondary | ICD-10-CM | POA: Insufficient documentation

## 2021-10-17 DIAGNOSIS — Z8589 Personal history of malignant neoplasm of other organs and systems: Secondary | ICD-10-CM | POA: Insufficient documentation

## 2021-10-17 LAB — HEPATIC FUNCTION PANEL
ALT: 22 (ref 7–35)
AST: 30 (ref 13–35)
Alkaline Phosphatase: 80 (ref 25–125)
Bilirubin, Total: 0.6

## 2021-10-17 LAB — CBC: RBC: 3.66 — AB (ref 3.87–5.11)

## 2021-10-17 LAB — TSH: TSH: 1.796 u[IU]/mL (ref 0.350–4.500)

## 2021-10-17 LAB — CBC AND DIFFERENTIAL
HCT: 35 — AB (ref 36–46)
Hemoglobin: 11.4 — AB (ref 12.0–16.0)
Neutrophils Absolute: 12.39
Platelets: 123 — AB (ref 150–399)
WBC: 16.3

## 2021-10-17 LAB — COMPREHENSIVE METABOLIC PANEL
Albumin: 4.2 (ref 3.5–5.0)
Calcium: 8.7 (ref 8.7–10.7)

## 2021-10-17 LAB — BASIC METABOLIC PANEL
BUN: 17 (ref 4–21)
CO2: 27 — AB (ref 13–22)
Chloride: 108 (ref 99–108)
Creatinine: 1 (ref 0.5–1.1)
Glucose: 95
Potassium: 4.5 (ref 3.4–5.3)
Sodium: 143 (ref 137–147)

## 2021-10-17 LAB — IRON AND TIBC
Iron: 89 ug/dL (ref 28–170)
Saturation Ratios: 18 % (ref 10.4–31.8)
TIBC: 484 ug/dL — ABNORMAL HIGH (ref 250–450)
UIBC: 395 ug/dL

## 2021-10-17 LAB — FERRITIN: Ferritin: 16 ng/mL (ref 11–307)

## 2021-10-18 LAB — T4: T4, Total: 7.9 ug/dL (ref 4.5–12.0)

## 2021-10-19 ENCOUNTER — Telehealth: Payer: Self-pay

## 2021-10-19 NOTE — Telephone Encounter (Signed)
-----   Message from Derwood Kaplan, MD sent at 10/19/2021  9:09 AM EST ----- ?Regarding: call ?Tell her thyroid and iron are good ? ?

## 2021-10-19 NOTE — Telephone Encounter (Signed)
Patient notified of Thyroid and Iron levels ? ?

## 2021-10-22 ENCOUNTER — Ambulatory Visit (INDEPENDENT_AMBULATORY_CARE_PROVIDER_SITE_OTHER): Payer: PPO

## 2021-10-22 DIAGNOSIS — I639 Cerebral infarction, unspecified: Secondary | ICD-10-CM | POA: Diagnosis not present

## 2021-10-22 NOTE — Telephone Encounter (Signed)
-----   Message from Maxwell Marion, RN sent at 10/19/2021 12:54 PM EST ----- ?Regarding: RE: call ?Called with no answer ?----- Message ----- ?From: Derwood Kaplan, MD ?Sent: 10/19/2021   9:09 AM EST ?To: Maxwell Marion, RN ?Subject: call                                          ? ?Tell her thyroid and iron are good ? ? ?

## 2021-10-22 NOTE — Telephone Encounter (Signed)
Patient notified

## 2021-10-23 LAB — CUP PACEART REMOTE DEVICE CHECK
Date Time Interrogation Session: 20230303230925
Implantable Pulse Generator Implant Date: 20210413

## 2021-11-05 NOTE — Progress Notes (Signed)
Carelink Summary Report / Loop Recorder 

## 2021-11-26 ENCOUNTER — Ambulatory Visit (INDEPENDENT_AMBULATORY_CARE_PROVIDER_SITE_OTHER): Payer: PPO

## 2021-11-26 DIAGNOSIS — I639 Cerebral infarction, unspecified: Secondary | ICD-10-CM

## 2021-11-27 DIAGNOSIS — Z1331 Encounter for screening for depression: Secondary | ICD-10-CM | POA: Diagnosis not present

## 2021-11-27 DIAGNOSIS — I10 Essential (primary) hypertension: Secondary | ICD-10-CM | POA: Diagnosis not present

## 2021-11-27 DIAGNOSIS — M19012 Primary osteoarthritis, left shoulder: Secondary | ICD-10-CM | POA: Diagnosis not present

## 2021-11-27 DIAGNOSIS — Z8673 Personal history of transient ischemic attack (TIA), and cerebral infarction without residual deficits: Secondary | ICD-10-CM | POA: Diagnosis not present

## 2021-11-27 DIAGNOSIS — Z79899 Other long term (current) drug therapy: Secondary | ICD-10-CM | POA: Diagnosis not present

## 2021-11-27 DIAGNOSIS — H539 Unspecified visual disturbance: Secondary | ICD-10-CM | POA: Diagnosis not present

## 2021-11-27 DIAGNOSIS — I700 Atherosclerosis of aorta: Secondary | ICD-10-CM | POA: Diagnosis not present

## 2021-11-27 DIAGNOSIS — E559 Vitamin D deficiency, unspecified: Secondary | ICD-10-CM | POA: Diagnosis not present

## 2021-11-27 DIAGNOSIS — K219 Gastro-esophageal reflux disease without esophagitis: Secondary | ICD-10-CM | POA: Diagnosis not present

## 2021-11-27 DIAGNOSIS — M159 Polyosteoarthritis, unspecified: Secondary | ICD-10-CM | POA: Diagnosis not present

## 2021-11-27 DIAGNOSIS — E538 Deficiency of other specified B group vitamins: Secondary | ICD-10-CM | POA: Diagnosis not present

## 2021-11-27 DIAGNOSIS — E785 Hyperlipidemia, unspecified: Secondary | ICD-10-CM | POA: Diagnosis not present

## 2021-11-27 DIAGNOSIS — Z89419 Acquired absence of unspecified great toe: Secondary | ICD-10-CM | POA: Diagnosis not present

## 2021-11-27 DIAGNOSIS — Z139 Encounter for screening, unspecified: Secondary | ICD-10-CM | POA: Diagnosis not present

## 2021-11-27 DIAGNOSIS — Z8719 Personal history of other diseases of the digestive system: Secondary | ICD-10-CM | POA: Diagnosis not present

## 2021-11-27 LAB — CUP PACEART REMOTE DEVICE CHECK
Date Time Interrogation Session: 20230405230718
Implantable Pulse Generator Implant Date: 20210413

## 2021-11-28 DIAGNOSIS — K222 Esophageal obstruction: Secondary | ICD-10-CM | POA: Diagnosis not present

## 2021-11-28 DIAGNOSIS — K226 Gastro-esophageal laceration-hemorrhage syndrome: Secondary | ICD-10-CM | POA: Diagnosis not present

## 2021-11-28 DIAGNOSIS — K5790 Diverticulosis of intestine, part unspecified, without perforation or abscess without bleeding: Secondary | ICD-10-CM | POA: Diagnosis not present

## 2021-11-28 DIAGNOSIS — D649 Anemia, unspecified: Secondary | ICD-10-CM | POA: Diagnosis not present

## 2021-11-28 DIAGNOSIS — K219 Gastro-esophageal reflux disease without esophagitis: Secondary | ICD-10-CM | POA: Diagnosis not present

## 2021-11-28 DIAGNOSIS — K5900 Constipation, unspecified: Secondary | ICD-10-CM | POA: Diagnosis not present

## 2021-11-29 DIAGNOSIS — D485 Neoplasm of uncertain behavior of skin: Secondary | ICD-10-CM | POA: Diagnosis not present

## 2021-11-29 DIAGNOSIS — L821 Other seborrheic keratosis: Secondary | ICD-10-CM | POA: Diagnosis not present

## 2021-11-29 DIAGNOSIS — L814 Other melanin hyperpigmentation: Secondary | ICD-10-CM | POA: Diagnosis not present

## 2021-11-29 DIAGNOSIS — D2239 Melanocytic nevi of other parts of face: Secondary | ICD-10-CM | POA: Diagnosis not present

## 2021-11-29 DIAGNOSIS — L570 Actinic keratosis: Secondary | ICD-10-CM | POA: Diagnosis not present

## 2021-11-29 DIAGNOSIS — D225 Melanocytic nevi of trunk: Secondary | ICD-10-CM | POA: Diagnosis not present

## 2021-11-30 DIAGNOSIS — I110 Hypertensive heart disease with heart failure: Secondary | ICD-10-CM | POA: Diagnosis not present

## 2021-11-30 DIAGNOSIS — Z8673 Personal history of transient ischemic attack (TIA), and cerebral infarction without residual deficits: Secondary | ICD-10-CM | POA: Diagnosis not present

## 2021-11-30 DIAGNOSIS — D72829 Elevated white blood cell count, unspecified: Secondary | ICD-10-CM | POA: Diagnosis not present

## 2021-11-30 DIAGNOSIS — M159 Polyosteoarthritis, unspecified: Secondary | ICD-10-CM | POA: Diagnosis not present

## 2021-11-30 DIAGNOSIS — N289 Disorder of kidney and ureter, unspecified: Secondary | ICD-10-CM | POA: Diagnosis not present

## 2021-11-30 DIAGNOSIS — Z85828 Personal history of other malignant neoplasm of skin: Secondary | ICD-10-CM | POA: Diagnosis not present

## 2021-11-30 DIAGNOSIS — I509 Heart failure, unspecified: Secondary | ICD-10-CM | POA: Diagnosis not present

## 2021-11-30 DIAGNOSIS — E538 Deficiency of other specified B group vitamins: Secondary | ICD-10-CM | POA: Diagnosis not present

## 2021-11-30 DIAGNOSIS — K219 Gastro-esophageal reflux disease without esophagitis: Secondary | ICD-10-CM | POA: Diagnosis not present

## 2021-11-30 DIAGNOSIS — Z6830 Body mass index (BMI) 30.0-30.9, adult: Secondary | ICD-10-CM | POA: Diagnosis not present

## 2021-11-30 DIAGNOSIS — Z951 Presence of aortocoronary bypass graft: Secondary | ICD-10-CM | POA: Diagnosis not present

## 2021-11-30 DIAGNOSIS — E782 Mixed hyperlipidemia: Secondary | ICD-10-CM | POA: Diagnosis not present

## 2021-11-30 DIAGNOSIS — M7071 Other bursitis of hip, right hip: Secondary | ICD-10-CM | POA: Diagnosis not present

## 2021-11-30 DIAGNOSIS — M19012 Primary osteoarthritis, left shoulder: Secondary | ICD-10-CM | POA: Diagnosis not present

## 2021-11-30 DIAGNOSIS — D75839 Thrombocytosis, unspecified: Secondary | ICD-10-CM | POA: Diagnosis not present

## 2021-11-30 DIAGNOSIS — N3281 Overactive bladder: Secondary | ICD-10-CM | POA: Diagnosis not present

## 2021-11-30 DIAGNOSIS — Z7982 Long term (current) use of aspirin: Secondary | ICD-10-CM | POA: Diagnosis not present

## 2021-11-30 DIAGNOSIS — I2510 Atherosclerotic heart disease of native coronary artery without angina pectoris: Secondary | ICD-10-CM | POA: Diagnosis not present

## 2021-11-30 DIAGNOSIS — I5189 Other ill-defined heart diseases: Secondary | ICD-10-CM | POA: Diagnosis not present

## 2021-11-30 DIAGNOSIS — I700 Atherosclerosis of aorta: Secondary | ICD-10-CM | POA: Diagnosis not present

## 2021-11-30 DIAGNOSIS — Z89419 Acquired absence of unspecified great toe: Secondary | ICD-10-CM | POA: Diagnosis not present

## 2021-11-30 DIAGNOSIS — E559 Vitamin D deficiency, unspecified: Secondary | ICD-10-CM | POA: Diagnosis not present

## 2021-11-30 DIAGNOSIS — M1712 Unilateral primary osteoarthritis, left knee: Secondary | ICD-10-CM | POA: Diagnosis not present

## 2021-11-30 DIAGNOSIS — E669 Obesity, unspecified: Secondary | ICD-10-CM | POA: Diagnosis not present

## 2021-12-12 NOTE — Progress Notes (Signed)
Carelink Summary Report / Loop Recorder 

## 2021-12-13 DIAGNOSIS — C44319 Basal cell carcinoma of skin of other parts of face: Secondary | ICD-10-CM | POA: Diagnosis not present

## 2021-12-13 DIAGNOSIS — C44529 Squamous cell carcinoma of skin of other part of trunk: Secondary | ICD-10-CM | POA: Diagnosis not present

## 2021-12-13 DIAGNOSIS — C44329 Squamous cell carcinoma of skin of other parts of face: Secondary | ICD-10-CM | POA: Diagnosis not present

## 2021-12-14 ENCOUNTER — Other Ambulatory Visit: Payer: Self-pay

## 2021-12-14 NOTE — Assessment & Plan Note (Addendum)
Essential thrombocythemia previously treated with hydroxyurea. ?Due to low normal platelets, hydroxyurea was discontinued and October 2021. ?The platelets  remain stable.  She has leukocytosis/neutrophilia, which she has had on and off since 2018.  This is felt to be due to her myeloproliferative disease.  The white blood cell count is increased from March, but still in the same range it has been for the last year. ?

## 2021-12-14 NOTE — Progress Notes (Signed)
[name]  [address] [place],  [place]  [address] [phone]  Clinic Day:  12/17/2021  Referring physician: [name], MD  ASSESSMENT & PLAN:   Assessment & Plan: Essential thrombocythemia [facility]) Essential thrombocythemia previously treated with hydroxyurea.  Due to low normal platelets, hydroxyurea was discontinued and October 2021.  The platelets  remain stable.  She has leukocytosis/neutrophilia, which she has had on and off since 2018.  This is felt to be due to her myeloproliferative disease.  The white blood cell count is increased from March, but still in the same range it has been for the last year.  Cellulitis of external cheek, left She had 3 skin cancers removed from her face and chest 4 days ago.  These also have overlying eschar with reactive erythema surrounding the area.  However the left cheek has erythema extending outward concerning for cellulitis.  The patient is allergic to cephalexin, so I placed her on doxycycline 100 mg twice daily for a week.    We will plan to see her back in 1 month with a CBC for continued monitoring.  The patient understands the plans discussed today and is in agreement with them.  She knows to contact our office if she develops concerns prior to her next appointment.   I provided 20 minutes of face-to-face time during this encounter and > 50% was spent counseling as documented under my assessment and plan.    Marvia Pickles, PA-C  [facility] [address] [place] [address] Dept: [phone] Dept Fax: [phone]   Orders Placed This Encounter  Procedures   CBC and differential    This external order was created through the Results Console.   CBC    This external order was created through the Results Console.   CBC    This order was created through External Result Entry      CHIEF COMPLAINT:  CC: Essential  thrombocythemia  Current Treatment: Observation  HISTORY OF PRESENT ILLNESS:  Yolanda Flowers is a 81 year old female with a history of essential thrombocythemia originally diagnosed in 25.  She was treated with hydroxyurea 500 mg 3 times daily.  We have had to occasionally adjust doses because of leukopenia or anemia.  She also had a malignant melanoma of her left first toe resected in November 2015 with partial amputation of the toe and has done well.  She has had multiple other skin cancers removed over the years.  In March 2017, she was seen by Dr. Denman George for HSIL/VIN 3 of the right posterior labia minora and underwent vulvectomy.  Hydroxyurea was decreased to 500 mg twice daily prior to surgery, as we did not feel we could safely hold hydroxyurea.  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  The patient had been seeing the gynecologist every 6 months, but has not continued regular follow-ups there. The platelet count increased in May 2017 to 657,000, so her dose of hydroxyurea was increased to 3 times daily, then subsequently decreased to twice a day due to leukopenia and anemia.  Her platelets had remained between under 500,000 since August 2018.  Bone density scan in March 2018 revealed osteopenia with a T-score of -1.6 in the femur, for which she is on calcium and vitamin-D.  She has had chronic back pain, which is attributed to severe degenerative disease.  She had  an MRI of the lumbar spine in March 2018, which revealed significant degenerative disc disease with mild spinal stenosis at L1 and L4, but moderate at L2-3.  Dr. Maryjean Ka has given her injections, as well at hydrocodone/APAP 5/325.  She has had intermittent leukocytosis since 2018, felt to be secondary to her essential thrombocythemia.   She was evaluated in the emergency department in October 2019 and had a GI bleed. CT abdomen and pelvis revealed  acute uncomplicated diverticulitis of the sigmoid colon with no other acute abnormalities.  A moderate to large hiatal hernia was seen.  No focal hepatic lesions or intrahepatic biliary dilatation were seen.  She saw Dr. Geraldo Pitter and underwent nuclear med stress test in November, which did not reveal any evidence of inducible ischemia or other abnormality.  Left ventricular size and function was normal with an ejection fraction of 79%.  She states she was placed on Spiriva and Ventolin.  She underwent EGD and colonoscopy in December  2019, and no significant source of blood loss was identified.  Repeat iron studies, B12 and folate did not reveal any nutritional deficiency  She was instructed to continue lansoprazole.  She had dilatation of an esophageal stricture and removal of a polyp. Pathology revealed a fragmented tubular adenoma.    In January 2020, she had worsening anemia, in addition to continued episodes of chest pain and worsening dyspnea with exertion at her routine follow-up. Due to the severe dyspnea and right lower extremity edema, we obtained a CT angiogram chest and venous Doppler ultrasound of the right lower extremity. CTA chest did not reveal any evidence of pulmonary embolism.  There was good opacification of the pulmonary arteries.  Diffuse coronary artery calcifications with cardiomegaly and moderate thoracic aortic atherosclerosis was seen.  There was a 3.6 mm noncalcified nodule in the anterior inferior right upper lobe.  Non-contrasted CT chest in 6-12 months was recommended.  Right lower extremity venous Doppler ultrasound did not reveal any evidence of deep venous thrombosis.  Further evaluation of the anemia did not reveal evidence of hemolysis or monoclonal gammopathy. In February 2020, she had persistent anemia with a hemoglobin of 9.7.  As she was symptomatic, hydroxyurea 500 mg was decreased to once daily.  Soluble transferrin was elevated, which was consistent with iron deficiency.   She saw Dr. Lyda Jester and he placed her on iron supplementation daily in the form of ferrous sulfate 65 mg. She was seen again in March and had improvement in her anemia with a hemoglobin of 10.2.  Her thrombocythemia remained controlled on hydroxyurea 500 mg daily. We encouraged her to have follow-up for her vulvar cancer, so followed up with Dr. Carlena Bjornstad and her exam was normal.   He did not feel he needed to check her again for 2 years unless she had problems.  Due to chronic dyspnea, she underwent multiple tests at [facility]  Apparently, she did not have evidence of COPD.  She was therefore referred to Cardiology.   She saw Dr. Geraldo Pitter and was undergoing cardiac evaluation when we saw her in January 2021.  She had a bilateral screening mammogram in February 2021, which revealed an area of calcifications in the right breast warranting further evaluation.  A diagnostic right mammogram was ordered, but this was delayed, because she underwent the open heart surgery with CABG x4 in March.  She had a monitor placed in the left chest wall the time of surgery.  She did not feel she can have mammogram due to  the monitor in the left chest wall.  She then had 2 strokes following her surgery, from which she has recovered well, except for balance issues and left eye blindness.  She ambulates with a cane or walker depending on her circumstances.  She is on clopidogrel 75 mg daily, atorvastatin 80 mg daily, metoprolol 25 mg twice daily and pantoprazole 40 mg daily.  She had a venous Doppler ultrasound of the right lower extremity in March for swelling postoperatively which did not reveal any evidence of deep venous thrombosis.  She had a CT chest in July to follow-up on the pulmonary nodule which were stable. She finally underwent diagnostic right mammogram from July which revealed likely benign 6 mm group of calcifications involving the upper inner quadrant at middle depth. Bilateral diagnostic mammogram in  February 2022 was recommended     At her visit in October 2021, she reported elevated transaminases and purpura, which was attributed to atorvastatin.  Dr. Geraldo Pitter reduced the dose from 80 mg to 40 mg with normalization of the transaminases and no further purpura.  Her platelets were in low normal range, so hydroxyurea was held and since her platelets have remained normal, hydroxyurea was discontinued. She intermittent mild leukocytosis/neutrophilia since 2018.  She has occasionally had immature cells on her blood smear, but not blasts.    When I saw her in May, she had worsening leukocytosis with persistent immature cells and worsening anemia, so recommended a bone marrow biopsy, but this was never done as the patient was hospitalized with a stroke and discharged to rehab. She was seen again in August, at which time her white count had decreased from 24.2 to 19.9 with an [place] of 1473, with 11% lymphocytes, 10% monocytes and 2.4 % basophils and her hemoglobin had improved from 10.6 to 11.3. The platelet count remained normal, so continued observation was recommended.  Her blood counts were stable in November.   She was admitted in January with a 3 day history of sensation of an object stuck in her throat following attempted swallowing of calcium supplementation. She was eventually able to dislodge item after several hours.  She then developed stool that she described as "motor oil" for 2 days. She was evaluated in the emergency room and was found to have hemoglobin of 8.3 decreased from 11.4 in November 2022.   She underwent urgent EGD and was found to have a 3 cm mucosal tear within the distal 5 cm of her esophagus, felt to be most likely secondary to mucosal tear following calcium carbonate impaction. CT neck and chest was performed to evaluate for possibility of mediastinitis developing following possible full focus mucosal tear. This imaging did not reveal active inflammatory changes. 3 clips were placed  closing the tear successfully and complete hemostasis was achieved.  She was placed on twice daily pantoprazole with slow advancement of diet from clear liquid to house select over 2 days.  She had an acute drop in hemoglobin to 6.9 following further equilibration, so received 1 unit PRBC with appropriate response to 8.2 g/dL. Her hemoglobin remained at approximately 8 for the remainder of admission.  She was instructed to take iron supplementation while at home and to avoid calcium carbonate for time being and to transition to over-the-counter chewable supplementation.  Iron/TIBC did not reveal iron deficiency.  At hospital discharge, follow-up with her primary care provider was recommended for repeat ultrasound of thyroid to evaluate cystic nodule,  repeat noncontrast CT chest in 3 to 6 months, approximately  April to July 2023 to monitor 75 mm pulmonary nodules and consideration of resumption of metoprolol following after longer interval after GI bleed. She was not willing to take the pantoprazole that was prescribed.  Review her blood smear in March revealed occasional myelocytes, occasional nucleated RBC's, poikilocytosis, and 1 promyelocyte.   INTERVAL HISTORY:  Erienne is here today for repeat clinical assessment and states she has been doing fairly well.  She is able to do her ADLs, but is otherwise fairly and active.  She lives alone, but her son and daughter-in-law live nearby and are readily available.  She had several skin cancers removed from her face and chest last week by Dr. Jimmye Norman.  She states left cheek continued to bleed into the night, so she used black pepper to stop the bleeding.  She denies fevers or chills. She has chronic back pain which is stable.  Her appetite is good. Her weight has decreased 3 pounds over last 2 months .  She tells me today that she feels it was her vitamin C pill that got caught in her throat, so she has discontinued this.  REVIEW OF SYSTEMS:  Review of Systems   Constitutional:  Negative for appetite change, chills, fatigue, fever and unexpected weight change.  HENT:   Negative for lump/mass, mouth sores and sore throat.   Respiratory:  Negative for cough and shortness of breath.   Cardiovascular:  Negative for chest pain and leg swelling.  Gastrointestinal:  Negative for abdominal pain, constipation, diarrhea, nausea and vomiting.  Endocrine: Negative for hot flashes.  Genitourinary:  Negative for difficulty urinating, dysuria, frequency and hematuria.   Musculoskeletal:  Negative for arthralgias, back pain and myalgias.  Skin:  Negative for rash.  Neurological:  Negative for dizziness and headaches.  Hematological:  Negative for adenopathy. Does not bruise/bleed easily.  Psychiatric/Behavioral:  Negative for depression and sleep disturbance. The patient is not nervous/anxious.     VITALS:  Blood pressure (!) 160/95, pulse 66, temperature 98.4 F (36.9 C), temperature source Oral, resp. rate 18, height 5' 5" (1.651 m), weight 163 lb 4.8 oz (74.1 kg), SpO2 96 %.  Wt Readings from Last 3 Encounters:  12/17/21 163 lb 4.8 oz (74.1 kg)  10/17/21 166 lb 1.6 oz (75.3 kg)  10/01/21 170 lb 6.4 oz (77.3 kg)    Body mass index is 27.17 kg/m.  Performance status (ECOG): 2 - Symptomatic, <50% confined to bed  PHYSICAL EXAM:  Physical Exam Vitals and nursing note reviewed.  Constitutional:      General: She is not in acute distress.    Appearance: Normal appearance.  HENT:     Head: Normocephalic and atraumatic.     Mouth/Throat:     Mouth: Mucous membranes are moist.     Pharynx: Oropharynx is clear. No oropharyngeal exudate or posterior oropharyngeal erythema.  Eyes:     General: No scleral icterus.    Extraocular Movements: Extraocular movements intact.     Conjunctiva/sclera: Conjunctivae normal.     Pupils: Pupils are equal, round, and reactive to light.  Cardiovascular:     Rate and Rhythm: Normal rate and regular rhythm.     Heart  sounds: Normal heart sounds. No murmur heard.   No friction rub. No gallop.  Pulmonary:     Effort: Pulmonary effort is normal.     Breath sounds: Normal breath sounds. No wheezing, rhonchi or rales.  Abdominal:     General: There is no distension.     Palpations:  Abdomen is soft. There is no hepatomegaly, splenomegaly or mass.     Tenderness: There is no abdominal tenderness.  Musculoskeletal:        General: Normal range of motion.     Cervical back: Normal range of motion and neck supple. No tenderness.     Right lower leg: Edema (Mild, chronic and stable) present.     Left lower leg: No edema.  Lymphadenopathy:     Cervical: No cervical adenopathy.     Upper Body:     Right upper body: No supraclavicular or axillary adenopathy.     Left upper body: No supraclavicular or axillary adenopathy.     Lower Body: No right inguinal adenopathy. No left inguinal adenopathy.  Skin:    General: Skin is warm and dry.     Coloration: Skin is not jaundiced.     Findings: Erythema (Left cheek more so than the other surgical areas) present. No rash.     Comments: The area of surgery of the left cheek has a larger more prominent area of erythema surrounding it concerning for cellulitis.  Neurological:     Mental Status: She is alert and oriented to person, place, and time.     Cranial Nerves: No cranial nerve deficit.  Psychiatric:        Mood and Affect: Mood normal.        Behavior: Behavior normal.        Thought Content: Thought content normal.    LABS:      Latest Ref Rng & Units 12/17/2021   12:00 AM 10/17/2021   12:00 AM 09/19/2021   12:00 AM  CBC  WBC  23.7      16.3   24.1       Hemoglobin 12.0 - 16.0 12.1      11.4   10.2       Hematocrit 36 - 46 39      35   32       Platelets 150 - 400 K/uL 145      123   120          This result is from an external source.      Latest Ref Rng & Units 10/17/2021   12:00 AM 09/19/2021   12:00 AM 07/10/2021   12:00 AM  CMP  BUN 4 - _0 Creatinine 0.5 - 1.1 1.0   1.2      1.1    Sodium 137 - 147 143   143      144    Potassium 3.4 - 5.3 4.5   4.1      4.4    Chloride 99 - 108 108   107      109    CO2 13 - _1 Calcium 8.7 - 10.7 8.7   8.8      9.0    Alkaline Phos 25 - 125 80   76      78    AST 13 - 35 _2 ALT 7 - 35 _3 This result is from an external source.     No results found for: CEA1 / No results found for: CEA1 No  results found for: PSA1 No results found for: OFB510 No results found for: CAN125  No results found for: Ronnald Ramp, A1GS, A2GS, Arnaldo Natal, GAMS, MSPIKE, SPEI Lab Results  Component Value Date   TIBC 484 (H) 10/17/2021   TIBC 358 07/10/2021   TIBC 305 12/18/2020   FERRITIN 16 10/17/2021   FERRITIN 29 09/19/2021   FERRITIN 59 07/10/2021   IRONPCTSAT 18 10/17/2021   IRONPCTSAT 26 07/10/2021   IRONPCTSAT 15.4 12/18/2020   No results found for: LDH  STUDIES:  CUP PACEART REMOTE DEVICE CHECK  Result Date: 11/27/2021 ILR summary report received. Battery status OK. Normal device function. No new symptom, tachy, brady, or pause episodes. No new AF episodes. Monthly summary reports and ROV/PRN LA     HISTORY:   Past Medical History:  Diagnosis Date   Abdominal pain    Accelerating angina ([place]) 08/26/2019   Acute blood loss anemia 12/14/2019   Acute cardioembolic stroke ([place]) 25/85/2778   Allergic rhinitis 06/21/2014   Anxiety    Arthritis    Asthma    Atherosclerosis of arteries 08/24/2019   Bilateral edema of lower extremity    Body mass index (BMI) 25.0-25.9, adult 08/24/2019   CAD (coronary artery disease) 10/25/2019   Cellulitis of right leg    Chronic low back pain 01/27/2018   Combined hyperlipidemia 24/23/5361   Complication of anesthesia    difficulty waking   Congestive heart failure (CHF) ([place])    COPD (chronic obstructive pulmonary disease) ([place])    Coronary artery disease    Coronary  artery disease involving native heart without angina pectoris    Dyspnea 02/09/2019   Dyspnea on exertion    Dysuria 11/20/3152   Embolic stroke ([place]) 00/86/7619   Essential (primary) hypertension 02/20/2021   Essential thrombocythemia ([place]) 06/22/2014   Essential thrombocytosis ([place]) 06/22/2014   Fall at home, initial encounter 12/21/2020   Gastrointestinal bleeding 06/23/2018   GERD (gastroesophageal reflux disease)    GERD without esophagitis 06/21/2014   History of melanoma excision    left greast toe 2015   History of squamous cell carcinoma excision    face--  multiple excisions   History of subdural hematoma    2008   HTN (hypertension) 06/21/2014   Hypertension    Ischial bursitis, right 08/24/2019   Leucocytosis 12/14/2019   Leukocytosis 12/14/2019   Malignant melanoma of great toe ([place]) 08/24/2019   Melanoma in situ ([place]) 06/29/2014   Mixed hyperlipidemia 08/24/2019   Opioid dependence ([place]) 08/24/2019   OSA (obstructive sleep apnea)    intolerant   Pedal edema 08/24/2019   Pneumonia of both lungs due to infectious organism 12/20/2020   Renal insufficiency 08/24/2019   S/P CABG x 4 11/04/2019   Scoliosis (and kyphoscoliosis), idiopathic 01/27/2018   Skin cancer 08/24/2019   Solitary pulmonary nodule on lung CT 02/09/2019   Squamous cell carcinoma, scalp/neck 08/24/2019   Stroke ([place]) 12/21/2020   Stroke due to embolism ([place]) 11/25/2019   Subdural hematoma ([place]) 12/20/2020   Swelling of limb 08/24/2019   Thrombocytosis    takes hydoxyurea--  MONITORED BY DR Hinton Rao [facility])   Traumatic subdural hemorrhage with loss of consciousness of unspecified duration, initial encounter ([place]) 02/20/2021   VIN III (vulvar intraepithelial neoplasia III)    Vitamin B 12 deficiency 08/24/2019   Vitamin D deficiency 08/24/2019   Wears dentures    UPPER AND LOWER PARTIAL   Wears glasses     Past Surgical History:  Procedure Laterality Date  ABDOMINAL  HYSTERECTOMY  age 7   CARDIAC CATHETERIZATION  03/172021   CORONARY ARTERY BYPASS GRAFT N/A 11/04/2019   Procedure: CORONARY ARTERY BYPASS GRAFTING (CABG) x 4, with ENDOSCOPIC HARVESTING OF RIGHT GREATER SAPHENOUS VEIN.;  Surgeon: Gaye Pollack, MD;  Location: [place] OR;  Service: Open Heart Surgery;  Laterality: N/A;   INTRAVASCULAR ULTRASOUND/IVUS N/A 11/03/2019   Procedure: Intravascular Ultrasound/IVUS;  Surgeon: Nelva Bush, MD;  Location: [place] [facility] LAB;  Service: Cardiovascular;  Laterality: N/A;   KNEE ARTHROSCOPY Left 2004   LEFT HEART CATH AND CORONARY ANGIOGRAPHY N/A 11/03/2019   Procedure: LEFT HEART CATH AND CORONARY ANGIOGRAPHY;  Surgeon: Nelva Bush, MD;  Location: [place] [facility] LAB;  Service: Cardiovascular;  Laterality: N/A;   LOOP RECORDER INSERTION N/A 11/30/2019   Procedure: LOOP RECORDER INSERTION;  Surgeon: Thompson Grayer, MD;  Location: [place] [facility] LAB;  Service: Cardiovascular;  Laterality: N/A;   MELANOMA EXCISION  2015   left great toe   REPAIR PERONEAL TENDONS ANKLE  2004   SUBDURAL HEMATOMA EVACUATION VIA CRANIOTOMY  2008      week later  post-op  [facility]   TEE WITHOUT CARDIOVERSION N/A 11/04/2019   Procedure: TRANSESOPHAGEAL ECHOCARDIOGRAM (TEE);  Surgeon: Gaye Pollack, MD;  Location: [place];  Service: Open Heart Surgery;  Laterality: N/A;   VULVECTOMY N/A 10/24/2015   Procedure: WIDE LOCAL EXCISION OF THE VULVA ;  Surgeon: Everitt Amber, MD;  Location: [place];  Service: Gynecology;  Laterality: N/A;    Family History  Problem Relation Age of Onset   Clotting disorder Mother    Hypertension Mother    Heart disease Mother    Arthritis Mother    Stroke Father    Hypertension Father    Heart disease Father    Arthritis Father    Emphysema Father    Asthma Father    Kidney failure Father    Hypertension Sister    Heart disease Sister    Arthritis Sister    Hypertension Brother    Heart disease Brother    Arthritis  Brother     Social History:  reports that she has never smoked. She has never used smokeless tobacco. She reports that she does not drink alcohol and does not use drugs.The patient is alone today.  Allergies:  Allergies  Allergen Reactions   Cephalexin Hives   Penicillin G    Ciprofloxacin Other (See Comments)    Gi intolerance   Penicillins Rash    Childhood reaction Did it involve swelling of the face/tongue/throat, SOB, or low BP? No Did it involve sudden or severe rash/hives, skin peeling, or any reaction on the inside of your mouth or nose? No Did you need to seek medical attention at a hospital or doctor's office? No When did it last happen?      50 years ago If all above answers are "NO", may proceed with cephalosporin use.     Current Medications: Current Outpatient Medications  Medication Sig Dispense Refill   doxycycline (VIBRA-TABS) 100 MG tablet Take 1 tablet (100 mg total) by mouth 2 (two) times daily. 10 tablet 0   magnesium oxide (MAG-OX) 400 MG tablet Take 400 mg by mouth daily.     acetaminophen (TYLENOL) 325 MG tablet Take 1-2 tablets (325-650 mg total) by mouth every 4 (four) hours as needed for mild pain.     ammonium lactate (LAC-HYDRIN) 12 % lotion Apply 1 application topically as needed for dry skin.  Ascorbic Acid (VITAMIN C) 1000 MG tablet Take 1,000 mg by mouth daily.     aspirin EC 81 MG tablet Take 1 tablet (81 mg total) by mouth daily. Swallow whole. 90 tablet 3   atorvastatin (LIPITOR) 20 MG tablet Take 20 mg by mouth daily.     Calcium Carb-Cholecalciferol (CALCIUM 600 + D PO) Take 1 tablet by mouth in the morning and at bedtime.      fluticasone (FLONASE) 50 MCG/ACT nasal spray Place 2 sprays into both nostrils at bedtime.      Magnesium 250 MG TABS Take 250 mg by mouth daily.     meclizine (ANTIVERT) 12.5 MG tablet Take 12.5 mg by mouth 2 (two) times daily as needed for dizziness.     metoprolol tartrate (LOPRESSOR) 25 MG tablet TAKE 1 TABLET BY  MOUTH TWICE A DAY 180 tablet 3   montelukast (SINGULAIR) 10 MG tablet      Multiple Vitamins-Minerals (PRESERVISION AREDS 2) CAPS Take 1 capsule by mouth in the morning and at bedtime.      omeprazole (PRILOSEC) 20 MG capsule Take 20 mg by mouth daily.     oxybutynin (DITROPAN-XL) 10 MG 24 hr tablet      vitamin B-12 (CYANOCOBALAMIN) 500 MCG tablet Take 1 tablet (500 mcg total) by mouth daily.     Vitamin D, Ergocalciferol, (DRISDOL) 1.25 MG (50000 UT) CAPS capsule Take 50,000 Units by mouth every 14 (fourteen) days.     No current facility-administered medications for this visit.

## 2021-12-17 ENCOUNTER — Other Ambulatory Visit: Payer: Self-pay | Admitting: Hematology and Oncology

## 2021-12-17 ENCOUNTER — Other Ambulatory Visit: Payer: Self-pay

## 2021-12-17 ENCOUNTER — Inpatient Hospital Stay: Payer: PPO | Attending: Hematology and Oncology | Admitting: Hematology and Oncology

## 2021-12-17 ENCOUNTER — Encounter: Payer: Self-pay | Admitting: Hematology and Oncology

## 2021-12-17 ENCOUNTER — Inpatient Hospital Stay: Payer: PPO

## 2021-12-17 DIAGNOSIS — D473 Essential (hemorrhagic) thrombocythemia: Secondary | ICD-10-CM

## 2021-12-17 DIAGNOSIS — L03211 Cellulitis of face: Secondary | ICD-10-CM | POA: Diagnosis not present

## 2021-12-17 LAB — CBC
MCV: 94 (ref 81–99)
RBC: 4.11 (ref 3.87–5.11)

## 2021-12-17 LAB — CBC AND DIFFERENTIAL
HCT: 39 (ref 36–46)
Hemoglobin: 12.1 (ref 12.0–16.0)
Neutrophils Absolute: 17.54
Platelets: 145 10*3/uL — AB (ref 150–400)
WBC: 23.7

## 2021-12-17 MED ORDER — DOXYCYCLINE HYCLATE 100 MG PO TABS: 100.0000 mg | ORAL_TABLET | Freq: Two times a day (BID) | ORAL | 0 refills | Status: DC

## 2021-12-17 NOTE — Assessment & Plan Note (Signed)
She had 3 skin cancers removed from her face and chest 4 days ago.  These also have overlying eschar with reactive erythema surrounding the area.  However the left cheek has erythema extending outward concerning for cellulitis.  The patient is allergic to cephalexin, so I placed her on doxycycline 100 mg twice daily for a week. ?

## 2021-12-21 ENCOUNTER — Other Ambulatory Visit: Payer: Self-pay | Admitting: Hematology and Oncology

## 2021-12-21 DIAGNOSIS — D473 Essential (hemorrhagic) thrombocythemia: Secondary | ICD-10-CM

## 2021-12-21 DIAGNOSIS — D72828 Other elevated white blood cell count: Secondary | ICD-10-CM

## 2021-12-30 DIAGNOSIS — Z7982 Long term (current) use of aspirin: Secondary | ICD-10-CM | POA: Diagnosis not present

## 2021-12-30 DIAGNOSIS — M159 Polyosteoarthritis, unspecified: Secondary | ICD-10-CM | POA: Diagnosis not present

## 2021-12-30 DIAGNOSIS — M1712 Unilateral primary osteoarthritis, left knee: Secondary | ICD-10-CM | POA: Diagnosis not present

## 2021-12-30 DIAGNOSIS — M7071 Other bursitis of hip, right hip: Secondary | ICD-10-CM | POA: Diagnosis not present

## 2021-12-30 DIAGNOSIS — K219 Gastro-esophageal reflux disease without esophagitis: Secondary | ICD-10-CM | POA: Diagnosis not present

## 2021-12-30 DIAGNOSIS — Z85828 Personal history of other malignant neoplasm of skin: Secondary | ICD-10-CM | POA: Diagnosis not present

## 2021-12-30 DIAGNOSIS — I5189 Other ill-defined heart diseases: Secondary | ICD-10-CM | POA: Diagnosis not present

## 2021-12-30 DIAGNOSIS — I509 Heart failure, unspecified: Secondary | ICD-10-CM | POA: Diagnosis not present

## 2021-12-30 DIAGNOSIS — M19012 Primary osteoarthritis, left shoulder: Secondary | ICD-10-CM | POA: Diagnosis not present

## 2021-12-30 DIAGNOSIS — Z89419 Acquired absence of unspecified great toe: Secondary | ICD-10-CM | POA: Diagnosis not present

## 2021-12-30 DIAGNOSIS — D75839 Thrombocytosis, unspecified: Secondary | ICD-10-CM | POA: Diagnosis not present

## 2021-12-30 DIAGNOSIS — E559 Vitamin D deficiency, unspecified: Secondary | ICD-10-CM | POA: Diagnosis not present

## 2021-12-30 DIAGNOSIS — Z8673 Personal history of transient ischemic attack (TIA), and cerebral infarction without residual deficits: Secondary | ICD-10-CM | POA: Diagnosis not present

## 2021-12-30 DIAGNOSIS — I2510 Atherosclerotic heart disease of native coronary artery without angina pectoris: Secondary | ICD-10-CM | POA: Diagnosis not present

## 2021-12-30 DIAGNOSIS — E782 Mixed hyperlipidemia: Secondary | ICD-10-CM | POA: Diagnosis not present

## 2021-12-30 DIAGNOSIS — E669 Obesity, unspecified: Secondary | ICD-10-CM | POA: Diagnosis not present

## 2021-12-30 DIAGNOSIS — Z951 Presence of aortocoronary bypass graft: Secondary | ICD-10-CM | POA: Diagnosis not present

## 2021-12-30 DIAGNOSIS — D72829 Elevated white blood cell count, unspecified: Secondary | ICD-10-CM | POA: Diagnosis not present

## 2021-12-30 DIAGNOSIS — I700 Atherosclerosis of aorta: Secondary | ICD-10-CM | POA: Diagnosis not present

## 2021-12-30 DIAGNOSIS — E538 Deficiency of other specified B group vitamins: Secondary | ICD-10-CM | POA: Diagnosis not present

## 2021-12-30 DIAGNOSIS — N289 Disorder of kidney and ureter, unspecified: Secondary | ICD-10-CM | POA: Diagnosis not present

## 2021-12-30 DIAGNOSIS — Z6830 Body mass index (BMI) 30.0-30.9, adult: Secondary | ICD-10-CM | POA: Diagnosis not present

## 2021-12-30 DIAGNOSIS — I110 Hypertensive heart disease with heart failure: Secondary | ICD-10-CM | POA: Diagnosis not present

## 2021-12-30 DIAGNOSIS — N3281 Overactive bladder: Secondary | ICD-10-CM | POA: Diagnosis not present

## 2021-12-31 ENCOUNTER — Ambulatory Visit (INDEPENDENT_AMBULATORY_CARE_PROVIDER_SITE_OTHER): Payer: PPO

## 2021-12-31 DIAGNOSIS — I639 Cerebral infarction, unspecified: Secondary | ICD-10-CM

## 2022-01-01 LAB — CUP PACEART REMOTE DEVICE CHECK
Date Time Interrogation Session: 20230510230511
Implantable Pulse Generator Implant Date: 20210413

## 2022-01-02 DIAGNOSIS — Z9181 History of falling: Secondary | ICD-10-CM | POA: Diagnosis not present

## 2022-01-02 DIAGNOSIS — Z1331 Encounter for screening for depression: Secondary | ICD-10-CM | POA: Diagnosis not present

## 2022-01-02 DIAGNOSIS — Z0000 Encounter for general adult medical examination without abnormal findings: Secondary | ICD-10-CM | POA: Diagnosis not present

## 2022-01-02 DIAGNOSIS — E785 Hyperlipidemia, unspecified: Secondary | ICD-10-CM | POA: Diagnosis not present

## 2022-01-03 DIAGNOSIS — I2510 Atherosclerotic heart disease of native coronary artery without angina pectoris: Secondary | ICD-10-CM | POA: Diagnosis not present

## 2022-01-03 DIAGNOSIS — J342 Deviated nasal septum: Secondary | ICD-10-CM | POA: Diagnosis not present

## 2022-01-03 DIAGNOSIS — H9072 Mixed conductive and sensorineural hearing loss, unilateral, left ear, with unrestricted hearing on the contralateral side: Secondary | ICD-10-CM | POA: Diagnosis not present

## 2022-01-03 DIAGNOSIS — H6592 Unspecified nonsuppurative otitis media, left ear: Secondary | ICD-10-CM | POA: Diagnosis not present

## 2022-01-03 DIAGNOSIS — H539 Unspecified visual disturbance: Secondary | ICD-10-CM | POA: Diagnosis not present

## 2022-01-03 DIAGNOSIS — I69398 Other sequelae of cerebral infarction: Secondary | ICD-10-CM | POA: Diagnosis not present

## 2022-01-03 DIAGNOSIS — H6982 Other specified disorders of Eustachian tube, left ear: Secondary | ICD-10-CM | POA: Diagnosis not present

## 2022-01-03 DIAGNOSIS — H02106 Unspecified ectropion of left eye, unspecified eyelid: Secondary | ICD-10-CM | POA: Diagnosis not present

## 2022-01-03 DIAGNOSIS — C44320 Squamous cell carcinoma of skin of unspecified parts of face: Secondary | ICD-10-CM | POA: Diagnosis not present

## 2022-01-03 DIAGNOSIS — H9313 Tinnitus, bilateral: Secondary | ICD-10-CM | POA: Diagnosis not present

## 2022-01-03 DIAGNOSIS — Z951 Presence of aortocoronary bypass graft: Secondary | ICD-10-CM | POA: Diagnosis not present

## 2022-01-03 DIAGNOSIS — H9190 Unspecified hearing loss, unspecified ear: Secondary | ICD-10-CM | POA: Diagnosis not present

## 2022-01-21 NOTE — Progress Notes (Signed)
Carelink Summary Report / Loop Recorder 

## 2022-01-23 ENCOUNTER — Other Ambulatory Visit: Payer: Self-pay | Admitting: Cardiology

## 2022-01-23 NOTE — Telephone Encounter (Signed)
Atorvastatin 20 mg  # 90 x 1 [facility]ills [facility]   [id number] [place]

## 2022-01-28 DIAGNOSIS — H6592 Unspecified nonsuppurative otitis media, left ear: Secondary | ICD-10-CM | POA: Diagnosis not present

## 2022-01-28 DIAGNOSIS — H6982 Other specified disorders of Eustachian tube, left ear: Secondary | ICD-10-CM | POA: Diagnosis not present

## 2022-02-04 ENCOUNTER — Ambulatory Visit (INDEPENDENT_AMBULATORY_CARE_PROVIDER_SITE_OTHER): Payer: PPO

## 2022-02-04 DIAGNOSIS — I639 Cerebral infarction, unspecified: Secondary | ICD-10-CM | POA: Diagnosis not present

## 2022-02-06 LAB — CUP PACEART REMOTE DEVICE CHECK
Date Time Interrogation Session: 20230612230812
Implantable Pulse Generator Implant Date: 20210413

## 2022-02-12 DIAGNOSIS — L570 Actinic keratosis: Secondary | ICD-10-CM | POA: Diagnosis not present

## 2022-02-12 DIAGNOSIS — D0439 Carcinoma in situ of skin of other parts of face: Secondary | ICD-10-CM | POA: Diagnosis not present

## 2022-02-14 NOTE — Progress Notes (Signed)
[facility]  [address] [place],  [place]  [address] [phone]  Clinic Day:  02/15/22  Referring physician: [name], MD  ASSESSMENT & PLAN:   Assessment & Plan: Essential thrombocythemia [facility]) Essential thrombocythemia previously treated with hydroxyurea.  Due to low normal platelets, hydroxyurea was discontinued in October 2021.  The platelets  remain stable.  She has leukocytosis/neutrophilia, which she has had on and off since 2018.  This is felt to be due to her myeloproliferative disease.  The white blood cell count is decreased from May, but still in the same range it has been for the last year.  Anemia This is worse, with hemoglobin dropping from 12.1 to 11.3, but recent vitamin levels looked good.   History of vulvar cancer Treated with surgical excision.   Right upper lobe nodule  Pulmonology is following, but CT imaging in July 2021 revealed stable pulmonary nodules.   History of melanoma of the great toe Treated with surgical excision.  She remains without evidence of recurrence   Status post CABG x4 in March 2021.  Status post CVA x2 in March and April 2021.  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   Calcifications in the right breast in March 2021  Unilateral diagnostic mammogram from July characterized these as likely benign. She is overdue for bilateral diagnostic mammogram at this time.  I recommended that she have it, even at the age of 81, since breast cancer risk increases with age.    CVA May 2022.   She has recovered but still has some extremity weakness of the right hand.   Acute blood loss anemia, January 2023.  The patient was admitted with an esophageal tear.  Her hemoglobin dropped to 6.9 during admission, and so she received 1 unit of packed red blood cells with improvement in her hemoglobin to 8.2. She was up to 10.2 when she was seen on February 1st and  continued to improve.     We will plan to see her back in 2-3 months with a CBC and CMP for continued monitoring. I recommended she resume the meclizine for her vertigo. The patient understands the plans discussed today and is in agreement with them.  She knows to contact our office if she develops concerns prior to her next appointment.   I provided 20 minutes of face-to-face time during this encounter and > 50% was spent counseling as documented under my assessment and plan.    Derwood Kaplan, MD  [place] [address] [place] [address] Dept: [phone] Dept Fax: [phone]   Orders Placed This Encounter  Procedures  . CBC and differential    This external order was created through the Results Console.  . CBC    This external order was created through the Results Console.      CHIEF COMPLAINT:  CC: Essential thrombocythemia  Current Treatment: Observation  HISTORY OF PRESENT ILLNESS:  Yolanda Flowers is a 81 year old female with a history of essential thrombocythemia originally diagnosed in 37.  She was treated with hydroxyurea 500 mg 3 times daily.  We have had to occasionally adjust doses because of leukopenia or anemia.  She also had a malignant melanoma of her left first toe resected in November 2015 with partial amputation of the toe and has done well.  She has had multiple other skin cancers removed over the  years.  In March 2017, she was seen by Dr. Denman George for HSIL/VIN 3 of the right posterior labia minora and underwent vulvectomy.  Hydroxyurea was decreased to 500 mg twice daily prior to surgery, as we did not feel we could safely hold hydroxyurea.  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  Bone density scan in March 2018 revealed osteopenia with a T-score of -1.6 in the femur, for which she is on  calcium and vitamin-D.  She has had chronic back pain, which is attributed to severe degenerative disease.  She had an MRI of the lumbar spine in March 2018, which revealed significant degenerative disc disease with mild spinal stenosis at L1 and L4, but moderate at L2-3.  Dr. Maryjean Ka has given her injections, as well at hydrocodone/APAP 5/325.  She has had intermittent leukocytosis since 2018, felt to be secondary to her essential thrombocythemia. She was evaluated in the emergency department in October 2019 and had a GI bleed. CT abdomen and pelvis revealed acute uncomplicated diverticulitis of the sigmoid colon with no other acute abnormalities.  A moderate to large hiatal hernia was seen. She saw Dr. Geraldo Pitter and underwent nuclear med stress test in November, which did not reveal any evidence of inducible ischemia or other abnormality.  Left ventricular size and function was normal with an ejection fraction of 79%.  She underwent EGD and colonoscopy in December  2019, and no significant source of blood loss was identified. She was instructed to continue lansoprazole.  She had dilatation of an esophageal stricture and removal of a polyp. Pathology revealed a fragmented tubular adenoma.    In January 2020, she had worsening anemia, in addition to continued episodes of chest pain and worsening dyspnea with exertion at her routine follow-up. Due to the severe dyspnea and right lower extremity edema, we obtained a CT angiogram chest, which did not reveal any evidence of pulmonary embolism.  There was good opacification of the pulmonary arteries.  Diffuse coronary artery calcifications with cardiomegaly and moderate thoracic aortic atherosclerosis was seen.  There was a 3.6 mm noncalcified nodule in the anterior inferior right upper lobe.  Non-contrasted CT chest in 6-12 months was recommended.  Right lower extremity venous Doppler ultrasound did not reveal any evidence of deep venous thrombosis. In February 2020,  she had persistent anemia with a hemoglobin of 9.7. As she was symptomatic, hydroxyurea 500 mg was decreased to once daily.  Soluble transferrin was elevated, which was consistent with iron deficiency.  She saw Dr. Lyda Jester and he placed her on iron supplementation daily in the form of ferrous sulfate 65 mg. She was seen again in March and had improvement in her anemia with a hemoglobin of 10.2.  Her thrombocythemia remained controlled on hydroxyurea 500 mg daily. We encouraged her to have follow-up for her vulvar cancer, so followed up with Dr. Carlena Bjornstad and her exam was normal.     She had a bilateral screening mammogram in February 2021, which revealed an area of calcifications in the right breast warranting further evaluation.  A diagnostic right mammogram was ordered, but this was delayed, because she underwent open heart surgery with CABG x4 in March.  She had a monitor placed in the left chest wall the time of surgery.  She did not feel she can have mammogram due to the monitor in the left chest wall.  She then had 2 strokes following her surgery, from which she has  recovered well, except for balance issues and left eye blindness.  She ambulates with a cane or walker depending on her circumstances.  She is on clopidogrel 75 mg daily, atorvastatin 80 mg daily, metoprolol 25 mg twice daily and pantoprazole 40 mg daily. She had a CT chest in July to follow-up on the pulmonary nodule which were stable. She finally underwent diagnostic right mammogram from July which revealed likely benign 6 mm group of calcifications involving the upper inner quadrant at middle depth. Bilateral diagnostic mammogram in February 2022 was recommended  At her visit in October 2021, she reported elevated transaminases and purpura, which was attributed to atorvastatin.  Dr. Geraldo Pitter reduced the dose from 80 mg to 40 mg with normalization of the transaminases and no further purpura.  Her platelets were in low normal range, so  hydroxyurea was held and since her platelets have remained normal, hydroxyurea was discontinued. She has had intermittent mild leukocytosis/neutrophilia since 2018.  She has occasionally had immature cells on her blood smear, but not blasts.    When I saw her in May of 2022, she had worsening leukocytosis with persistent immature cells and worsening anemia, so I recommended a bone marrow biopsy, but this was never done as the patient was hospitalized with a stroke and discharged to rehab. She was seen again in August, at which time her white count had decreased from 24.2 to 19.9 with an Dumont of 1473, with 11% lymphocytes, 10% monocytes and 2.4 % basophils and her hemoglobin had improved from 10.6 to 11.3. The platelet count remained normal, so continued observation was recommended.  Her blood counts were stable in November.   She was admitted in January of 2023 with a 3 day history of sensation of an object stuck in her throat following attempted swallowing of calcium supplementation. She was eventually able to dislodge item after several hours.  She then developed stool that she described as "motor oil" for 2 days. She was evaluated in the emergency room and was found to have hemoglobin of 8.3 decreased from 11.4 in November 2022.   She underwent urgent EGD and was found to have a 3 cm mucosal tear within the distal 5 cm of her esophagus, felt to be most likely secondary to mucosal tear following calcium carbonate impaction. CT neck and chest was performed to evaluate for possibility of mediastinitis developing following possible full focus mucosal tear. This imaging did not reveal active inflammatory changes. 3 clips were placed closing the tear successfully and complete hemostasis was achieved.  She was placed on twice daily pantoprazole with slow advancement of diet from clear liquid to house select over 2 days.  She had an acute drop in hemoglobin to 6.9 following further equilibration, so received 1 unit  PRBC with appropriate response to 8.2 g/dL. Her hemoglobin remained at approximately 8 for the remainder of admission.  She was instructed to take iron supplementation while at home and to avoid calcium carbonate for time being and to transition to over-the-counter chewable supplementation.  Iron/TIBC did not reveal iron deficiency. Review her blood smear in March revealed occasional myelocytes, occasional nucleated RBC's, poikilocytosis, and 1 promyelocyte.   INTERVAL HISTORY:  Jeanni is here today for repeat clinical assessment and states she has been doing fairly well.  She is able to do her ADLs, but is otherwise fairly active. She complains of being unsteady. She lives alone, but her son and daughter-in-law live nearby and are readily available.  She has lower back pain that  she rates as an 8/10 and follows with the pain management clinic. She had several skin cancers removed from her face and chest by Dr. Jimmye Norman.  She sees Dr. Gaylyn Cheers for her ears and completed a course of prednisone for that. She may need placement of tubes. She denies fevers or chills. She has chronic back pain which is stable.  Her appetite is good. Her weight has increased 1 pound. Her WBC's were 23.7 and now are 18.4, with hemoglobin dropping from 12.1 to 11.3, and platelets from 146,000 to 124,000.  REVIEW OF SYSTEMS:  Review of Systems  Constitutional:  Negative for appetite change, chills, fatigue, fever and unexpected weight change.  HENT:   Negative for lump/mass, mouth sores and sore throat.   Respiratory:  Negative for cough and shortness of breath.   Cardiovascular:  Negative for chest pain and leg swelling.  Gastrointestinal:  Negative for abdominal pain, constipation, diarrhea, nausea and vomiting.  Endocrine: Negative for hot flashes.  Genitourinary:  Negative for difficulty urinating, dysuria, frequency and hematuria.   Musculoskeletal:  Positive for back pain. Negative for arthralgias and myalgias.  Skin:   Negative for rash.  Neurological:  Positive for dizziness. Negative for headaches.  Hematological:  Negative for adenopathy. Does not bruise/bleed easily.  Psychiatric/Behavioral:  Negative for depression and sleep disturbance. The patient is not nervous/anxious.      VITALS:  Blood pressure (!) 159/83, pulse 63, temperature 98.5 F (36.9 C), temperature source Oral, resp. rate 18, height '5\' 5"'  (1.651 m), weight 164 lb 9.6 oz (74.7 kg), SpO2 96 %.  Wt Readings from Last 3 Encounters:  02/15/22 164 lb 9.6 oz (74.7 kg)  12/17/21 163 lb 4.8 oz (74.1 kg)  10/17/21 166 lb 1.6 oz (75.3 kg)    Body mass index is 27.39 kg/m.  Performance status (ECOG): 1  PHYSICAL EXAM:  Physical Exam Vitals and nursing note reviewed.  Constitutional:      General: She is not in acute distress.    Appearance: Normal appearance.  HENT:     Head: Normocephalic and atraumatic.     Nose: Nose normal.     Mouth/Throat:     Mouth: Mucous membranes are moist.     Pharynx: Oropharynx is clear. No oropharyngeal exudate or posterior oropharyngeal erythema.  Eyes:     General: No scleral icterus.    Extraocular Movements: Extraocular movements intact.     Conjunctiva/sclera: Conjunctivae normal.     Pupils: Pupils are equal, round, and reactive to light.  Cardiovascular:     Rate and Rhythm: Normal rate and regular rhythm.     Heart sounds: Normal heart sounds. No murmur heard.    No friction rub. No gallop.  Pulmonary:     Effort: Pulmonary effort is normal.     Breath sounds: Normal breath sounds. No wheezing, rhonchi or rales.  Abdominal:     General: There is no distension.     Palpations: Abdomen is soft. There is no hepatomegaly, splenomegaly or mass.     Tenderness: There is no abdominal tenderness.  Musculoskeletal:        General: Normal range of motion.     Cervical back: Normal range of motion and neck supple. No tenderness.     Right lower leg: Edema (Mild, chronic and stable) present.      Left lower leg: No edema.  Lymphadenopathy:     Cervical: No cervical adenopathy.     Upper Body:     Right upper body:  No supraclavicular or axillary adenopathy.     Left upper body: No supraclavicular or axillary adenopathy.     Lower Body: No right inguinal adenopathy. No left inguinal adenopathy.  Skin:    General: Skin is warm and dry.     Coloration: Skin is not jaundiced.     Findings: Lesion present. No rash. Erythema: Left cheek more so than the other surgical areas.    Comments: She has multiple keratotic lesions   Neurological:     Mental Status: She is alert and oriented to person, place, and time.     Cranial Nerves: No cranial nerve deficit.  Psychiatric:        Mood and Affect: Mood normal.        Behavior: Behavior normal.        Thought Content: Thought content normal.        Judgment: Judgment normal.    LABS:      Latest Ref Rng & Units 02/15/2022   12:00 AM 12/17/2021   12:00 AM 10/17/2021   12:00 AM  CBC  WBC  18.4     23.7     16.3   Hemoglobin 12.0 - 16.0 11.3     12.1     11.4   Hematocrit 36 - 46 35     39     35   Platelets 150 - 400 K/uL 124     145     123      This result is from an external source.      Latest Ref Rng & Units 10/17/2021   12:00 AM 09/19/2021   12:00 AM 07/10/2021   12:00 AM  CMP  BUN 4 - '21 17  19     17   ' Creatinine 0.5 - 1.1 1.0  1.2     1.1   Sodium 137 - 147 143  143     144   Potassium 3.4 - 5.3 4.5  4.1     4.4   Chloride 99 - 108 108  107     109   CO2 13 - '22 27  25     27   ' Calcium 8.7 - 10.7 8.7  8.8     9.0   Alkaline Phos 25 - 125 80  76     78   AST 13 - 35 '30  31     31   ' ALT 7 - 35 '22  21     23      ' This result is from an external source.     No results found for: "CEA1", "CEA" / No results found for: "CEA1", "CEA" No results found for: "PSA1" No results found for: "HRC163" No results found for: "CAN125"  No results found for: "TOTALPROTELP", "ALBUMINELP", "A1GS", "A2GS", "BETS", "BETA2SER", "GAMS",  "MSPIKE", "SPEI" Lab Results  Component Value Date   TIBC 484 (H) 10/17/2021   TIBC 358 07/10/2021   TIBC 305 12/18/2020   FERRITIN 16 10/17/2021   FERRITIN 29 09/19/2021   FERRITIN 59 07/10/2021   IRONPCTSAT 18 10/17/2021   IRONPCTSAT 26 07/10/2021   IRONPCTSAT 15.4 12/18/2020   No results found for: "LDH"  STUDIES:  CUP PACEART REMOTE DEVICE CHECK  Result Date: 03/11/2022 ILR summary report received. Battery status OK. Normal device function. No new symptom, tachy, brady, or pause episodes. No new AF episodes. Monthly summary reports and ROV/PRN LA     HISTORY:   Past Medical History:  Diagnosis  Date  . Abdominal pain   . Accelerating angina ([place]) 08/26/2019  . Acute blood loss anemia 12/14/2019  . Acute cardioembolic stroke ([place]) 62/95/2841  . Allergic rhinitis 06/21/2014  . Anxiety   . Arthritis   . Asthma   . Atherosclerosis of arteries 08/24/2019  . Bilateral edema of lower extremity   . Body mass index (BMI) 25.0-25.9, adult 08/24/2019  . CAD (coronary artery disease) 10/25/2019  . Cellulitis of right leg   . Chronic low back pain 01/27/2018  . Combined hyperlipidemia 08/24/2019  . Complication of anesthesia    difficulty waking  . Congestive heart failure (CHF) ([place])   . COPD (chronic obstructive pulmonary disease) ([place])   . Coronary artery disease   . Coronary artery disease involving native heart without angina pectoris   . Dyspnea 02/09/2019  . Dyspnea on exertion   . Dysuria 09/19/2021  . Embolic stroke ([place]) 32/44/0102  . Essential (primary) hypertension 02/20/2021  . Essential thrombocythemia ([place]) 06/22/2014  . Essential thrombocytosis ([place]) 06/22/2014  . Fall at home, initial encounter 12/21/2020  . Gastrointestinal bleeding 06/23/2018  . GERD (gastroesophageal reflux disease)   . GERD without esophagitis 06/21/2014  . History of melanoma excision    left greast toe 2015  . History of squamous cell carcinoma excision    face--  multiple  excisions  . History of subdural hematoma    2008  . HTN (hypertension) 06/21/2014  . Hypertension   . Ischial bursitis, right 08/24/2019  . Leucocytosis 12/14/2019  . Leukocytosis 12/14/2019  . Malignant melanoma of great toe ([place]) 08/24/2019  . Melanoma in situ ([place]) 06/29/2014  . Mixed hyperlipidemia 08/24/2019  . Opioid dependence ([place]) 08/24/2019  . OSA (obstructive sleep apnea)    intolerant  . Pedal edema 08/24/2019  . Pneumonia of both lungs due to infectious organism 12/20/2020  . Renal insufficiency 08/24/2019  . S/P CABG x 4 11/04/2019  . Scoliosis (and kyphoscoliosis), idiopathic 01/27/2018  . Skin cancer 08/24/2019  . Solitary pulmonary nodule on lung CT 02/09/2019  . Squamous cell carcinoma, scalp/neck 08/24/2019  . Stroke ([place]) 12/21/2020  . Stroke due to embolism ([place]) 11/25/2019  . Subdural hematoma ([place]) 12/20/2020  . Swelling of limb 08/24/2019  . Thrombocytosis    takes hydoxyurea--  MONITORED BY DR Hinton Rao [facility])  . Traumatic subdural hemorrhage with loss of consciousness of unspecified duration, initial encounter ([place]) 02/20/2021  . VIN III (vulvar intraepithelial neoplasia III)   . Vitamin B 12 deficiency 08/24/2019  . Vitamin D deficiency 08/24/2019  . Wears dentures    UPPER AND LOWER PARTIAL  . Wears glasses     Past Surgical History:  Procedure Laterality Date  . ABDOMINAL HYSTERECTOMY  age 59  . CARDIAC CATHETERIZATION  03/172021  . CORONARY ARTERY BYPASS GRAFT N/A 11/04/2019   Procedure: CORONARY ARTERY BYPASS GRAFTING (CABG) x 4, with ENDOSCOPIC HARVESTING OF RIGHT GREATER SAPHENOUS VEIN.;  Surgeon: Gaye Pollack, MD;  Location: [facility] OR;  Service: Open Heart Surgery;  Laterality: N/A;  . INTRAVASCULAR ULTRASOUND/IVUS N/A 11/03/2019   Procedure: Intravascular Ultrasound/IVUS;  Surgeon: Nelva Bush, MD;  Location: [facility];  Service: Cardiovascular;  Laterality: N/A;  . KNEE ARTHROSCOPY Left 2004  . LEFT HEART CATH  AND CORONARY ANGIOGRAPHY N/A 11/03/2019   Procedure: LEFT HEART CATH AND CORONARY ANGIOGRAPHY;  Surgeon: Nelva Bush, MD;  Location: [facility] CV LAB;  Service: Cardiovascular;  Laterality: N/A;  . LOOP RECORDER INSERTION N/A 11/30/2019   Procedure: LOOP  RECORDER INSERTION;  Surgeon: Thompson Grayer, MD;  Location: [facility] CV LAB;  Service: Cardiovascular;  Laterality: N/A;  . MELANOMA EXCISION  2015   left great toe  . REPAIR PERONEAL TENDONS ANKLE  2004  . SUBDURAL HEMATOMA EVACUATION VIA CRANIOTOMY  2008      week later  post-op  [facility]  . TEE WITHOUT CARDIOVERSION N/A 11/04/2019   Procedure: TRANSESOPHAGEAL ECHOCARDIOGRAM (TEE);  Surgeon: Gaye Pollack, MD;  Location: [facility];  Service: Open Heart Surgery;  Laterality: N/A;  . VULVECTOMY N/A 10/24/2015   Procedure: WIDE LOCAL EXCISION OF THE VULVA ;  Surgeon: Everitt Amber, MD;  Location: [facility];  Service: Gynecology;  Laterality: N/A;    Family History  Problem Relation Age of Onset  . Clotting disorder Mother   . Hypertension Mother   . Heart disease Mother   . Arthritis Mother   . Stroke Father   . Hypertension Father   . Heart disease Father   . Arthritis Father   . Emphysema Father   . Asthma Father   . Kidney failure Father   . Hypertension Sister   . Heart disease Sister   . Arthritis Sister   . Hypertension Brother   . Heart disease Brother   . Arthritis Brother     Social History:  reports that she has never smoked. She has never used smokeless tobacco. She reports that she does not drink alcohol and does not use drugs.The patient is alone today.  Allergies:  Allergies  Allergen Reactions  . Cephalexin Hives  . Penicillin G   . Ciprofloxacin Other (See Comments)    Gi intolerance  . Penicillins Rash    Childhood reaction Did it involve swelling of the face/tongue/throat, SOB, or low BP? No Did it involve sudden or severe rash/hives, skin peeling, or any reaction on the inside  of your mouth or nose? No Did you need to seek medical attention at a hospital or doctor's office? No When did it last happen?      50 years ago If all above answers are "NO", may proceed with cephalosporin use.     Current Medications: Current Outpatient Medications  Medication Sig Dispense Refill  . acetaminophen (TYLENOL) 325 MG tablet Take 1-2 tablets (325-650 mg total) by mouth every 4 (four) hours as needed for mild pain.    Marland Kitchen ammonium lactate (LAC-HYDRIN) 12 % lotion Apply 1 application topically as needed for dry skin.    . Ascorbic Acid (VITAMIN C) 1000 MG tablet Take 1,000 mg by mouth daily.    Marland Kitchen aspirin EC 81 MG tablet Take 1 tablet (81 mg total) by mouth daily. Swallow whole. 90 tablet 3  . atorvastatin (LIPITOR) 20 MG tablet TAKE 1 TABLET BY MOUTH EVERY DAY 90 tablet 1  . Calcium Carb-Cholecalciferol (CALCIUM 600 + D PO) Take 1 tablet by mouth in the morning and at bedtime.     . clopidogrel (PLAVIX) 75 MG tablet Take 1 tablet (75 mg total) by mouth daily. 90 tablet 0  . doxycycline (VIBRA-TABS) 100 MG tablet Take 1 tablet (100 mg total) by mouth 2 (two) times daily. 10 tablet 0  . fluticasone (FLONASE) 50 MCG/ACT nasal spray Place 2 sprays into both nostrils at bedtime.     . Magnesium 250 MG TABS Take 250 mg by mouth daily.    . magnesium oxide (MAG-OX) 400 MG tablet Take 400 mg by mouth daily.    . meclizine (ANTIVERT) 12.5  MG tablet Take 12.5 mg by mouth 2 (two) times daily as needed for dizziness.    . metoprolol tartrate (LOPRESSOR) 25 MG tablet TAKE 1 TABLET BY MOUTH TWICE A DAY 180 tablet 3  . montelukast (SINGULAIR) 10 MG tablet     . Multiple Vitamins-Minerals (PRESERVISION AREDS 2) CAPS Take 1 capsule by mouth in the morning and at bedtime.     Marland Kitchen omeprazole (PRILOSEC) 20 MG capsule Take 20 mg by mouth daily.    Marland Kitchen oxybutynin (DITROPAN-XL) 10 MG 24 hr tablet     . vitamin B-12 (CYANOCOBALAMIN) 500 MCG tablet Take 1 tablet (500 mcg total) by mouth daily.    . Vitamin  D, Ergocalciferol, (DRISDOL) 1.25 MG (50000 UT) CAPS capsule Take 50,000 Units by mouth every 14 (fourteen) days.     No current facility-administered medications for this visit.

## 2022-02-15 ENCOUNTER — Inpatient Hospital Stay: Payer: PPO | Attending: Hematology and Oncology | Admitting: Oncology

## 2022-02-15 ENCOUNTER — Inpatient Hospital Stay: Payer: PPO

## 2022-02-15 ENCOUNTER — Other Ambulatory Visit: Payer: Self-pay | Admitting: Oncology

## 2022-02-15 ENCOUNTER — Encounter: Payer: Self-pay | Admitting: Oncology

## 2022-02-15 VITALS — BP 159/83 | HR 63 | Temp 98.5°F | Resp 18 | Ht 65.0 in | Wt 164.6 lb

## 2022-02-15 DIAGNOSIS — D473 Essential (hemorrhagic) thrombocythemia: Secondary | ICD-10-CM

## 2022-02-15 DIAGNOSIS — D72828 Other elevated white blood cell count: Secondary | ICD-10-CM | POA: Diagnosis not present

## 2022-02-15 DIAGNOSIS — D649 Anemia, unspecified: Secondary | ICD-10-CM | POA: Diagnosis not present

## 2022-02-15 LAB — CBC: RBC: 3.64 — AB (ref 3.87–5.11)

## 2022-02-15 LAB — CBC AND DIFFERENTIAL
HCT: 35 — AB (ref 36–46)
Hemoglobin: 11.3 — AB (ref 12.0–16.0)
Neutrophils Absolute: 14.17
Platelets: 124 10*3/uL — AB (ref 150–400)
WBC: 18.4

## 2022-02-18 DIAGNOSIS — Z85820 Personal history of malignant melanoma of skin: Secondary | ICD-10-CM | POA: Diagnosis not present

## 2022-02-18 DIAGNOSIS — I6523 Occlusion and stenosis of bilateral carotid arteries: Secondary | ICD-10-CM | POA: Diagnosis not present

## 2022-02-18 DIAGNOSIS — Z87440 Personal history of urinary (tract) infections: Secondary | ICD-10-CM | POA: Diagnosis not present

## 2022-02-18 DIAGNOSIS — I2510 Atherosclerotic heart disease of native coronary artery without angina pectoris: Secondary | ICD-10-CM | POA: Diagnosis not present

## 2022-02-18 DIAGNOSIS — R410 Disorientation, unspecified: Secondary | ICD-10-CM | POA: Diagnosis not present

## 2022-02-18 DIAGNOSIS — R519 Headache, unspecified: Secondary | ICD-10-CM | POA: Diagnosis not present

## 2022-02-18 DIAGNOSIS — N390 Urinary tract infection, site not specified: Secondary | ICD-10-CM | POA: Diagnosis not present

## 2022-02-18 DIAGNOSIS — I342 Nonrheumatic mitral (valve) stenosis: Secondary | ICD-10-CM

## 2022-02-18 DIAGNOSIS — R531 Weakness: Secondary | ICD-10-CM | POA: Diagnosis not present

## 2022-02-18 DIAGNOSIS — Z7982 Long term (current) use of aspirin: Secondary | ICD-10-CM | POA: Diagnosis not present

## 2022-02-18 DIAGNOSIS — R413 Other amnesia: Secondary | ICD-10-CM | POA: Diagnosis not present

## 2022-02-18 DIAGNOSIS — M19011 Primary osteoarthritis, right shoulder: Secondary | ICD-10-CM | POA: Diagnosis not present

## 2022-02-18 DIAGNOSIS — Z880 Allergy status to penicillin: Secondary | ICD-10-CM | POA: Diagnosis not present

## 2022-02-18 DIAGNOSIS — R29700 NIHSS score 0: Secondary | ICD-10-CM | POA: Diagnosis not present

## 2022-02-18 DIAGNOSIS — I10 Essential (primary) hypertension: Secondary | ICD-10-CM | POA: Diagnosis not present

## 2022-02-18 DIAGNOSIS — Z9889 Other specified postprocedural states: Secondary | ICD-10-CM | POA: Diagnosis not present

## 2022-02-18 DIAGNOSIS — D649 Anemia, unspecified: Secondary | ICD-10-CM | POA: Diagnosis not present

## 2022-02-18 DIAGNOSIS — Z881 Allergy status to other antibiotic agents status: Secondary | ICD-10-CM | POA: Diagnosis not present

## 2022-02-18 DIAGNOSIS — I6340 Cerebral infarction due to embolism of unspecified cerebral artery: Secondary | ICD-10-CM | POA: Diagnosis not present

## 2022-02-18 DIAGNOSIS — G9341 Metabolic encephalopathy: Secondary | ICD-10-CM | POA: Diagnosis not present

## 2022-02-18 DIAGNOSIS — M19012 Primary osteoarthritis, left shoulder: Secondary | ICD-10-CM | POA: Diagnosis not present

## 2022-02-18 DIAGNOSIS — K219 Gastro-esophageal reflux disease without esophagitis: Secondary | ICD-10-CM | POA: Diagnosis not present

## 2022-02-18 DIAGNOSIS — D696 Thrombocytopenia, unspecified: Secondary | ICD-10-CM | POA: Diagnosis not present

## 2022-02-18 DIAGNOSIS — Z951 Presence of aortocoronary bypass graft: Secondary | ICD-10-CM | POA: Diagnosis not present

## 2022-02-18 DIAGNOSIS — M1990 Unspecified osteoarthritis, unspecified site: Secondary | ICD-10-CM | POA: Diagnosis not present

## 2022-02-18 DIAGNOSIS — Z792 Long term (current) use of antibiotics: Secondary | ICD-10-CM | POA: Diagnosis not present

## 2022-02-18 DIAGNOSIS — Z79899 Other long term (current) drug therapy: Secondary | ICD-10-CM | POA: Diagnosis not present

## 2022-02-18 DIAGNOSIS — F32A Depression, unspecified: Secondary | ICD-10-CM | POA: Diagnosis not present

## 2022-02-18 DIAGNOSIS — I361 Nonrheumatic tricuspid (valve) insufficiency: Secondary | ICD-10-CM

## 2022-02-18 DIAGNOSIS — I672 Cerebral atherosclerosis: Secondary | ICD-10-CM | POA: Diagnosis not present

## 2022-02-18 DIAGNOSIS — Z20822 Contact with and (suspected) exposure to covid-19: Secondary | ICD-10-CM | POA: Diagnosis not present

## 2022-02-18 DIAGNOSIS — I639 Cerebral infarction, unspecified: Secondary | ICD-10-CM | POA: Diagnosis not present

## 2022-02-18 DIAGNOSIS — Z8673 Personal history of transient ischemic attack (TIA), and cerebral infarction without residual deficits: Secondary | ICD-10-CM | POA: Diagnosis not present

## 2022-02-18 DIAGNOSIS — Z7902 Long term (current) use of antithrombotics/antiplatelets: Secondary | ICD-10-CM | POA: Diagnosis not present

## 2022-02-26 NOTE — Progress Notes (Signed)
Carelink Summary Report / Loop Recorder 

## 2022-02-28 DIAGNOSIS — Z8673 Personal history of transient ischemic attack (TIA), and cerebral infarction without residual deficits: Secondary | ICD-10-CM | POA: Diagnosis not present

## 2022-02-28 DIAGNOSIS — E559 Vitamin D deficiency, unspecified: Secondary | ICD-10-CM | POA: Diagnosis not present

## 2022-02-28 DIAGNOSIS — Z79899 Other long term (current) drug therapy: Secondary | ICD-10-CM | POA: Diagnosis not present

## 2022-02-28 DIAGNOSIS — E538 Deficiency of other specified B group vitamins: Secondary | ICD-10-CM | POA: Diagnosis not present

## 2022-02-28 DIAGNOSIS — Z09 Encounter for follow-up examination after completed treatment for conditions other than malignant neoplasm: Secondary | ICD-10-CM | POA: Diagnosis not present

## 2022-02-28 DIAGNOSIS — I700 Atherosclerosis of aorta: Secondary | ICD-10-CM | POA: Diagnosis not present

## 2022-02-28 DIAGNOSIS — Z139 Encounter for screening, unspecified: Secondary | ICD-10-CM | POA: Diagnosis not present

## 2022-02-28 DIAGNOSIS — K219 Gastro-esophageal reflux disease without esophagitis: Secondary | ICD-10-CM | POA: Diagnosis not present

## 2022-02-28 DIAGNOSIS — Z89419 Acquired absence of unspecified great toe: Secondary | ICD-10-CM | POA: Diagnosis not present

## 2022-02-28 DIAGNOSIS — H539 Unspecified visual disturbance: Secondary | ICD-10-CM | POA: Diagnosis not present

## 2022-02-28 DIAGNOSIS — Z8719 Personal history of other diseases of the digestive system: Secondary | ICD-10-CM | POA: Diagnosis not present

## 2022-02-28 DIAGNOSIS — M159 Polyosteoarthritis, unspecified: Secondary | ICD-10-CM | POA: Diagnosis not present

## 2022-02-28 DIAGNOSIS — E785 Hyperlipidemia, unspecified: Secondary | ICD-10-CM | POA: Diagnosis not present

## 2022-03-11 ENCOUNTER — Ambulatory Visit (INDEPENDENT_AMBULATORY_CARE_PROVIDER_SITE_OTHER): Payer: PPO

## 2022-03-11 DIAGNOSIS — I639 Cerebral infarction, unspecified: Secondary | ICD-10-CM

## 2022-03-11 LAB — CUP PACEART REMOTE DEVICE CHECK
Date Time Interrogation Session: 20230715230937
Implantable Pulse Generator Implant Date: 20210413

## 2022-03-14 ENCOUNTER — Telehealth: Payer: Self-pay | Admitting: Cardiology

## 2022-03-14 ENCOUNTER — Telehealth: Payer: Self-pay

## 2022-03-14 MED ORDER — CLOPIDOGREL BISULFATE 75 MG PO TABS: 75.0000 mg | ORAL_TABLET | Freq: Every day | ORAL | 0 refills | Status: DC

## 2022-03-14 NOTE — Telephone Encounter (Signed)
Pt c/o medication issue:  1. Name of Medication: Plavix  2. How are you currently taking this medication (dosage and times per day)? V   3. Are you having a reaction (difficulty breathing--STAT)? no  4. What is your medication issue? Patient calling in to get a prescription for Plavix, when she went to the hospital the dr there put her on it. Please advise

## 2022-03-14 NOTE — Telephone Encounter (Signed)
Patient aware per Dr. Agustin Cree after reviewing [facility] records ok to continue with Plavix 75 mg qd and follow up if needed with Dr. Geraldo Pitter. Rx sent to confirm pharmacy.

## 2022-03-27 ENCOUNTER — Other Ambulatory Visit: Payer: Self-pay | Admitting: Cardiology

## 2022-04-10 ENCOUNTER — Institutional Professional Consult (permissible substitution): Payer: PPO | Admitting: Neurology

## 2022-04-15 ENCOUNTER — Ambulatory Visit (INDEPENDENT_AMBULATORY_CARE_PROVIDER_SITE_OTHER): Payer: PPO

## 2022-04-15 DIAGNOSIS — I639 Cerebral infarction, unspecified: Secondary | ICD-10-CM | POA: Diagnosis not present

## 2022-04-15 LAB — CUP PACEART REMOTE DEVICE CHECK
Date Time Interrogation Session: 20230817231033
Implantable Pulse Generator Implant Date: 20210413

## 2022-04-15 NOTE — Progress Notes (Signed)
Carelink Summary Report / Loop Recorder 

## 2022-04-29 ENCOUNTER — Ambulatory Visit: Payer: Self-pay | Admitting: Licensed Clinical Social Worker

## 2022-04-29 NOTE — Patient Outreach (Signed)
  Care Coordination   Initial Visit Note   04/29/2022 Name: Yolanda Flowers MRN: [id number] DOB: 1941-05-09  Yolanda Flowers is a 81 y.o. year old female who sees [name], MD for primary care. I spoke with  [name] by phone today.  What matters to the patients health and wellness today?  Patient declined services on today. Stated she has family support.    Goals Addressed               This Visit's Progress     MSW Care Coordination (pt-stated)        Patient stated she has family who are supportive and assist with he needs.  Patient will contact her PCP if further SW needs arise.  SW completed SDOH screening and chart review. No needs or barriers present.  Patient denied needing SW services at this time.        SDOH assessments and interventions completed:  Yes     Care Coordination Interventions Activated:  Yes  Care Coordination Interventions:  Yes, provided   Follow up plan: No further intervention required.   Encounter Outcome:  Pt. Visit Completed   Lenor Derrick , MSW Social Worker [facility]  [phone]

## 2022-05-03 ENCOUNTER — Encounter: Payer: Self-pay | Admitting: Hematology and Oncology

## 2022-05-03 ENCOUNTER — Inpatient Hospital Stay: Payer: PPO

## 2022-05-03 ENCOUNTER — Inpatient Hospital Stay: Payer: PPO | Attending: Hematology and Oncology | Admitting: Hematology and Oncology

## 2022-05-03 ENCOUNTER — Telehealth: Payer: Self-pay | Admitting: Hematology and Oncology

## 2022-05-03 DIAGNOSIS — D473 Essential (hemorrhagic) thrombocythemia: Secondary | ICD-10-CM

## 2022-05-03 LAB — BASIC METABOLIC PANEL
BUN: 18 (ref 4–21)
CO2: 23 — AB (ref 13–22)
Chloride: 109 — AB (ref 99–108)
Creatinine: 1 (ref 0.5–1.1)
Glucose: 100
Potassium: 4.5 mEq/L (ref 3.5–5.1)
Sodium: 142 (ref 137–147)

## 2022-05-03 LAB — CBC
MCV: 97 (ref 81–99)
RBC: 3.68 — AB (ref 3.87–5.11)

## 2022-05-03 LAB — HEPATIC FUNCTION PANEL
ALT: 26 U/L (ref 7–35)
AST: 36 — AB (ref 13–35)
Alkaline Phosphatase: 76 (ref 25–125)
Bilirubin, Total: 0.7

## 2022-05-03 LAB — CBC AND DIFFERENTIAL
HCT: 36 (ref 36–46)
Hemoglobin: 11.8 — AB (ref 12.0–16.0)
Neutrophils Absolute: 13.33
Platelets: 129 10*3/uL — AB (ref 150–400)
WBC: 19.9

## 2022-05-03 LAB — COMPREHENSIVE METABOLIC PANEL
Albumin: 4.3 (ref 3.5–5.0)
Calcium: 8.8 (ref 8.7–10.7)

## 2022-05-03 NOTE — Progress Notes (Addendum)
Yolanda Flowers  [address] [place],  [place]  [address] [phone]  Clinic Day:  05/03/2022  Referring physician: [name], MD  ASSESSMENT & PLAN:   Assessment & Plan: Essential thrombocythemia [facility]) Essential thrombocythemia previously treated with hydroxyurea.  Due to low normal platelets, hydroxyurea was discontinued and October 2021.  The platelets  remain stable.  She has leukocytosis/neutrophilia, which she has had on and off since 2018.  This is felt to be due to her myeloproliferative disease.  She has stable leukocytosis, mild anemia and thrombocytopenia, so we recommend continued observation.  We will plan to see her back in 3 months for repeat clinical assessment.   The patient understands the plans discussed today and is in agreement with them.  She knows to contact our office if she develops concerns prior to her next appointment.   I provided 20 minutes of face-to-face time during this encounter and > 50% was spent counseling as documented under my assessment and plan.    Yolanda Pickles, PA-C  [facility] [address] [place] [address] Dept: [phone] Dept Fax: [phone]   Orders Placed This Encounter  Procedures  . CBC and differential    This external order was created through the Results Console.  . CBC    This external order was created through the Results Console.  . CBC    This order was created through External Result Entry  . Basic metabolic panel    This external order was created through the Results Console.  . Comprehensive metabolic panel    This external order was created through the Results Console.  . Hepatic function panel    This external order was created through the Results Console.      CHIEF COMPLAINT:  CC: Essential thrombocythemia  Current Treatment: Observation  HISTORY OF PRESENT ILLNESS:  Yolanda Flowers is a  81 year old female with a history of essential thrombocythemia originally diagnosed in 31.  She was treated with hydroxyurea 500 mg 3 times daily.  We have had to occasionally adjust doses because of leukopenia or anemia.  Over the years, she has had multiple medical problems including malignant melanoma treated with partial amputation of her left great toe, HSIL/VIN 3 of the right posterior labia minora treated with vulvectomy and multiple squamous cell carcinoma of the skin.  She has history of stroke.  She underwent CABG x 4 in March 2021.  She has a history of stroke and GI bleed.  She had removal of a esophageal polyp, pathology was a fragmented tubular adenoma.  She has osteopenia and chronic back pain due to severe degenerative disease.   In January 2020, she had worsening anemia, in addition to continued episodes of chest pain and worsening dyspnea with exertion at her routine follow-up. Due to the severe dyspnea and right lower extremity edema, we obtained a CT angiogram chest, which did not reveal any evidence of pulmonary embolism.  There was good opacification of the pulmonary arteries.  Diffuse coronary artery calcifications with cardiomegaly and moderate thoracic aortic atherosclerosis was seen.  There was a 3.6 mm noncalcified nodule in the anterior inferior right upper lobe.  Non-contrasted CT chest in 6-12 months was recommended.  Right lower extremity venous Doppler ultrasound did not reveal any evidence of deep venous thrombosis. In February 2020, she had persistent anemia with a hemoglobin of 9.7. As she was symptomatic, hydroxyurea 500 mg was decreased to  once daily. Soluble transferrin was elevated, which was consistent with iron deficiency.  She saw Dr. Lyda Flowers and he placed her on iron supplementation daily in the form of ferrous sulfate 325 mg. She was seen again in March 2020 and had improvement in her anemia with a hemoglobin of 10.2.  Her thrombocythemia remained controlled on  hydroxyurea 500 mg daily.  She has had intermittent mild leukocytosis/neutrophilia since 2018.  She has occasionally had immature cells on her blood smear, but not blasts.  Her platelets were in low normal range, so hydroxyurea was held, then subsequently discontinued as her platelets remained normal.   In May 2022, she had worsening leukocytosis with persistent immature cells and worsening anemia.  Bone marrow biopsy was recommended, but this was never done as the patient was hospitalized with a stroke and discharged to rehab. She was seen again in August, at which time her white count had decreased from 24.2 to 19.9 with an Melvina of 1473, with 11% lymphocytes, 10% monocytes and 2.4 % basophils and her hemoglobin had improved from 10.6 to 11.3. The platelet count remained normal, so continued observation was recommended.  Her blood counts were stable in November.   She was admitted in January 2023 with a 3 day history of sensation of an object stuck in her throat following attempted swallowing of calcium supplementation. She was eventually able to dislodge item after several hours.  She then developed stool that she described as "motor oil" for 2 days. She was evaluated in the emergency room and was found to have hemoglobin of 8.3 decreased from 11.4 in November 2022.   She underwent urgent EGD and was found to have a 3 cm mucosal tear within the distal 5 cm of her esophagus, felt to be most likely secondary to calcium carbonate impaction. CT neck and chest was performed to evaluate for possibility of mediastinitis developing following possible full focus mucosal tear. This imaging did not reveal active inflammatory changes. 3 clips were placed closing the tear successfully and complete hemostasis was achieved.  She was placed on twice daily pantoprazole with advancement of diet over 2 days.  She had an acute drop in hemoglobin to 6.9, so received 1 unit PRBC with appropriate response to 8.2 g/dL. Her hemoglobin  remained at approximately 8 for the remainder of admission.  She was instructed to take iron supplementation while at home and to avoid calcium carbonate tablets and to transition to over-the-counter chewable supplementation.  Iron/TIBC did not reveal iron deficiency. Review her blood smear in March revealed occasional myelocytes, occasional nucleated RBC's, poikilocytosis, and 1 promyelocyte.  The changes in peripheral smear persisted in May, then in June there was no flag automated CBC.  INTERVAL HISTORY:  Yolanda Flowers is here today for repeat clinical assessment and has been doing fairly well.  Since her last visit, she was hospitalized in July when her son brought her to the ER with report of confusion, memory issues and increased urinary frequency.  No infection was found.  MRI brain revealed 20-30 punctate acute infarctions scattered throughout the cerebellum and both cerebral hemispheres consistent with microembolic infarctions.  She was placed on Plavix.  She states her urinary frequency and incontinence have been worse since starting Plavix.  She ambulates with a cane.  She had a fall about 4 weeks ago and did not seek medical attention.  She states she has had intermittent right-sided rib pain since falling.  She denies progressive fatigue concerning for worsening anemia.  She denies abnormal  bleeding or bruising.  She denies headaches, vision changes, paresthesias or focal weakness.  She denies fevers or chills. She denies pain. Her appetite is good. Her weight has decreased 3 pounds over last 3 months .  She continues to live alone, but her family is nearby.  REVIEW OF SYSTEMS:  Review of Systems  Constitutional:  Negative for appetite change, chills, fatigue, fever and unexpected weight change.  HENT:   Negative for lump/mass, mouth sores and sore throat.   Respiratory:  Negative for cough and shortness of breath.   Cardiovascular:  Negative for chest pain and leg swelling.  Gastrointestinal:   Negative for abdominal pain, constipation, diarrhea, nausea and vomiting.  Endocrine: Negative for hot flashes.  Genitourinary:  Positive for bladder incontinence. Negative for difficulty urinating, dysuria, frequency and hematuria.   Musculoskeletal:  Negative for arthralgias, back pain and myalgias.  Skin:  Negative for rash.  Neurological:  Negative for dizziness and headaches.  Hematological:  Negative for adenopathy. Does not bruise/bleed easily.  Psychiatric/Behavioral:  Negative for depression and sleep disturbance. The patient is nervous/anxious.      VITALS:  Blood pressure (!) 141/78, pulse 71, temperature 99.3 F (37.4 C), temperature source Oral, resp. rate 18, height '5\' 5"'  (1.651 m), weight 161 lb 8 oz (73.3 kg), SpO2 94 %.  Wt Readings from Last 3 Encounters:  05/03/22 161 lb 8 oz (73.3 kg)  02/15/22 164 lb 9.6 oz (74.7 kg)  12/17/21 163 lb 4.8 oz (74.1 kg)    Body mass index is 26.88 kg/m.  Performance status (ECOG): 2 - Symptomatic, <50% confined to bed  PHYSICAL EXAM:  Physical Exam Vitals and nursing note reviewed.  Constitutional:      General: She is not in acute distress.    Appearance: Normal appearance.  HENT:     Head: Normocephalic and atraumatic.     Mouth/Throat:     Mouth: Mucous membranes are moist.     Pharynx: Oropharynx is clear. No oropharyngeal exudate or posterior oropharyngeal erythema.  Eyes:     General: No scleral icterus.    Extraocular Movements: Extraocular movements intact.     Conjunctiva/sclera: Conjunctivae normal.     Pupils: Pupils are equal, round, and reactive to light.  Cardiovascular:     Rate and Rhythm: Normal rate and regular rhythm.     Heart sounds: Murmur heard.     Systolic murmur is present with a grade of 2/6.     No friction rub. No gallop.  Pulmonary:     Effort: Pulmonary effort is normal.     Breath sounds: Normal breath sounds. No wheezing, rhonchi or rales.  Abdominal:     General: There is no  distension.     Palpations: Abdomen is soft. There is no hepatomegaly, splenomegaly or mass.     Tenderness: There is no abdominal tenderness.  Musculoskeletal:        General: Normal range of motion.     Cervical back: Normal range of motion and neck supple. No tenderness.     Right lower leg: No edema.     Left lower leg: No edema.  Lymphadenopathy:     Cervical: No cervical adenopathy.     Upper Body:     Right upper body: No supraclavicular or axillary adenopathy.     Left upper body: No supraclavicular or axillary adenopathy.     Lower Body: No right inguinal adenopathy. No left inguinal adenopathy.  Skin:    General: Skin is warm  and dry.     Coloration: Skin is not jaundiced.     Findings: No rash.  Neurological:     Mental Status: She is alert and oriented to person, place, and time.     Cranial Nerves: No cranial nerve deficit.  Psychiatric:        Mood and Affect: Mood normal.        Behavior: Behavior normal.        Thought Content: Thought content normal.    LABS:      Latest Ref Rng & Units 05/03/2022   12:00 AM 02/15/2022   12:00 AM 12/17/2021   12:00 AM  CBC  WBC  19.9     18.4     23.7      Hemoglobin 12.0 - 16.0 11.8     11.3     12.1      Hematocrit 36 - 46 36     35     39      Platelets 150 - 400 K/uL 129     124     145         This result is from an external source.      Latest Ref Rng & Units 05/03/2022   12:00 AM 10/17/2021   12:00 AM 09/19/2021   12:00 AM  CMP  BUN 4 - '21 18     17  19      ' Creatinine 0.5 - 1.1 1.0     1.0  1.2      Sodium 137 - 147 142     143  143      Potassium 3.5 - 5.1 mEq/L 4.5     4.5  4.1      Chloride 99 - 108 109     108  107      CO2 13 - '22 23     27  25      ' Calcium 8.7 - 10.7 8.8     8.7  8.8      Alkaline Phos 25 - 125 76     80  76      AST 13 - 35 36     30  31      ALT 7 - 35 U/L '26     22  21         ' This result is from an external source.     No results found for: "CEA1", "CEA" / No results found for:  "CEA1", "CEA" No results found for: "PSA1" No results found for: "ZOX096" No results found for: "CAN125"  No results found for: "TOTALPROTELP", "ALBUMINELP", "A1GS", "A2GS", "BETS", "BETA2SER", "GAMS", "MSPIKE", "SPEI" Lab Results  Component Value Date   TIBC 484 (H) 10/17/2021   TIBC 358 07/10/2021   TIBC 305 12/18/2020   FERRITIN 16 10/17/2021   FERRITIN 29 09/19/2021   FERRITIN 59 07/10/2021   IRONPCTSAT 18 10/17/2021   IRONPCTSAT 26 07/10/2021   IRONPCTSAT 15.4 12/18/2020   No results found for: "LDH"  STUDIES:  CUP PACEART REMOTE DEVICE CHECK  Result Date: 04/15/2022 ILR summary report received. Battery status OK. Normal device function. No new symptom, tachy, brady, or pause episodes. No new AF episodes. Monthly summary reports and ROV/PRN LA     HISTORY:   Past Medical History:  Diagnosis Date  . Abdominal pain   . Accelerating angina ([place]) 08/26/2019  . Acute blood loss anemia 12/14/2019  . Acute cardioembolic stroke ([place]) 04/54/0981  .  Allergic rhinitis 06/21/2014  . Anxiety   . Arthritis   . Asthma   . Atherosclerosis of arteries 08/24/2019  . Bilateral edema of lower extremity   . Body mass index (BMI) 25.0-25.9, adult 08/24/2019  . CAD (coronary artery disease) 10/25/2019  . Cellulitis of right leg   . Chronic low back pain 01/27/2018  . Combined hyperlipidemia 08/24/2019  . Complication of anesthesia    difficulty waking  . Congestive heart failure (CHF) ([place])   . COPD (chronic obstructive pulmonary disease) ([place])   . Coronary artery disease   . Coronary artery disease involving native heart without angina pectoris   . Dyspnea 02/09/2019  . Dyspnea on exertion   . Dysuria 09/19/2021  . Embolic stroke ([place]) 29/47/6546  . Essential (primary) hypertension 02/20/2021  . Essential thrombocythemia ([place]) 06/22/2014  . Essential thrombocytosis ([place]) 06/22/2014  . Fall at home, initial encounter 12/21/2020  . Gastrointestinal bleeding 06/23/2018  . GERD  (gastroesophageal reflux disease)   . GERD without esophagitis 06/21/2014  . History of melanoma excision    left greast toe 2015  . History of squamous cell carcinoma excision    face--  multiple excisions  . History of subdural hematoma    2008  . HTN (hypertension) 06/21/2014  . Hypertension   . Ischial bursitis, right 08/24/2019  . Leucocytosis 12/14/2019  . Leukocytosis 12/14/2019  . Malignant melanoma of great toe ([place]) 08/24/2019  . Melanoma in situ ([place]) 06/29/2014  . Mixed hyperlipidemia 08/24/2019  . Opioid dependence ([place]) 08/24/2019  . OSA (obstructive sleep apnea)    intolerant  . Pedal edema 08/24/2019  . Pneumonia of both lungs due to infectious organism 12/20/2020  . Renal insufficiency 08/24/2019  . S/P CABG x 4 11/04/2019  . Scoliosis (and kyphoscoliosis), idiopathic 01/27/2018  . Skin cancer 08/24/2019  . Solitary pulmonary nodule on lung CT 02/09/2019  . Squamous cell carcinoma, scalp/neck 08/24/2019  . Stroke ([place]) 12/21/2020  . Stroke due to embolism ([place]) 11/25/2019  . Subdural hematoma ([place]) 12/20/2020  . Swelling of limb 08/24/2019  . Thrombocytosis    takes hydoxyurea--  MONITORED BY DR Hinton Rao [facility])  . Traumatic subdural hemorrhage with loss of consciousness of unspecified duration, initial encounter ([place]) 02/20/2021  . VIN III (vulvar intraepithelial neoplasia III)   . Vitamin B 12 deficiency 08/24/2019  . Vitamin D deficiency 08/24/2019  . Wears dentures    UPPER AND LOWER PARTIAL  . Wears glasses     Past Surgical History:  Procedure Laterality Date  . ABDOMINAL HYSTERECTOMY  age 60  . CARDIAC CATHETERIZATION  03/172021  . CORONARY ARTERY BYPASS GRAFT N/A 11/04/2019   Procedure: CORONARY ARTERY BYPASS GRAFTING (CABG) x 4, with ENDOSCOPIC HARVESTING OF RIGHT GREATER SAPHENOUS VEIN.;  Surgeon: Gaye Pollack, MD;  Location: [facility];  Service: Open Heart Surgery;  Laterality: N/A;  . INTRAVASCULAR ULTRASOUND/IVUS N/A 11/03/2019    Procedure: Intravascular Ultrasound/IVUS;  Surgeon: Nelva Bush, MD;  Location: [facility];  Service: Cardiovascular;  Laterality: N/A;  . KNEE ARTHROSCOPY Left 2004  . LEFT HEART CATH AND CORONARY ANGIOGRAPHY N/A 11/03/2019   Procedure: LEFT HEART CATH AND CORONARY ANGIOGRAPHY;  Surgeon: Nelva Bush, MD;  Location: [facility];  Service: Cardiovascular;  Laterality: N/A;  . LOOP RECORDER INSERTION N/A 11/30/2019   Procedure: LOOP RECORDER INSERTION;  Surgeon: Thompson Grayer, MD;  Location: [facility];  Service: Cardiovascular;  Laterality: N/A;  . MELANOMA EXCISION  2015   left  great toe  . REPAIR PERONEAL TENDONS ANKLE  2004  . SUBDURAL HEMATOMA EVACUATION VIA CRANIOTOMY  2008      week later  post-op  [facility]  . TEE WITHOUT CARDIOVERSION N/A 11/04/2019   Procedure: TRANSESOPHAGEAL ECHOCARDIOGRAM (TEE);  Surgeon: Gaye Pollack, MD;  Location: [facility];  Service: Open Heart Surgery;  Laterality: N/A;  . VULVECTOMY N/A 10/24/2015   Procedure: WIDE LOCAL EXCISION OF THE VULVA ;  Surgeon: Everitt Amber, MD;  Location: [facility];  Service: Gynecology;  Laterality: N/A;    Family History  Problem Relation Age of Onset  . Clotting disorder Mother   . Hypertension Mother   . Heart disease Mother   . Arthritis Mother   . Stroke Father   . Hypertension Father   . Heart disease Father   . Arthritis Father   . Emphysema Father   . Asthma Father   . Kidney failure Father   . Hypertension Sister   . Heart disease Sister   . Arthritis Sister   . Hypertension Brother   . Heart disease Brother   . Arthritis Brother     Social History:  reports that she has never smoked. She has never used smokeless tobacco. She reports that she does not drink alcohol and does not use drugs.The patient is alone today.  Allergies:  Allergies  Allergen Reactions  . Cephalexin Hives  . Penicillin G   . Ciprofloxacin Other (See Comments)    Gi intolerance   . Penicillins Rash    Childhood reaction Did it involve swelling of the face/tongue/throat, SOB, or low BP? No Did it involve sudden or severe rash/hives, skin peeling, or any reaction on the inside of your mouth or nose? No Did you need to seek medical attention at a hospital or doctor's office? No When did it last happen?      50 years ago If all above answers are "NO", may proceed with cephalosporin use.     Current Medications: Current Outpatient Medications  Medication Sig Dispense Refill  . acetaminophen (TYLENOL) 325 MG tablet Take 1-2 tablets (325-650 mg total) by mouth every 4 (four) hours as needed for mild pain.    Marland Kitchen ammonium lactate (LAC-HYDRIN) 12 % lotion Apply 1 application topically as needed for dry skin.    . Ascorbic Acid (VITAMIN C) 1000 MG tablet Take 1,000 mg by mouth daily.    Marland Kitchen aspirin EC 81 MG tablet Take 1 tablet (81 mg total) by mouth daily. Swallow whole. 90 tablet 3  . atorvastatin (LIPITOR) 20 MG tablet TAKE 1 TABLET BY MOUTH EVERY DAY 90 tablet 1  . Calcium Carb-Cholecalciferol (CALCIUM 600 + D PO) Take 1 tablet by mouth in the morning and at bedtime.     . clopidogrel (PLAVIX) 75 MG tablet Take 1 tablet (75 mg total) by mouth daily. 90 tablet 0  . fluticasone (FLONASE) 50 MCG/ACT nasal spray Place 2 sprays into both nostrils at bedtime.     . magnesium oxide (MAG-OX) 400 MG tablet Take 400 mg by mouth daily.    . meclizine (ANTIVERT) 12.5 MG tablet Take 12.5 mg by mouth 2 (two) times daily as needed for dizziness.    . metoprolol tartrate (LOPRESSOR) 25 MG tablet Take 1 tablet (25 mg total) by mouth 2 (two) times daily. 180 tablet 1  . Multiple Vitamins-Minerals (PRESERVISION AREDS 2) CAPS Take 1 capsule by mouth in the morning and at bedtime.     Marland Kitchen  omeprazole (PRILOSEC) 20 MG capsule Take 20 mg by mouth daily.    . vitamin B-12 (CYANOCOBALAMIN) 500 MCG tablet Take 1 tablet (500 mcg total) by mouth daily.    . Vitamin D, Ergocalciferol, (DRISDOL) 1.25 MG  (50000 UT) CAPS capsule Take 50,000 Units by mouth every 14 (fourteen) days.     No current facility-administered medications for this visit.

## 2022-05-03 NOTE — Telephone Encounter (Signed)
9/15/23Next appt scheduled and confirmed with patient

## 2022-05-03 NOTE — Assessment & Plan Note (Addendum)
Essential thrombocythemia previously treated with hydroxyurea. Due to low normal platelets, hydroxyurea was discontinued and October 2021. The platelets  remain stable.  She has leukocytosis/neutrophilia, which she has had on and off since 2018.  This is felt to be due to her myeloproliferative disease.  She has stable leukocytosis, mild anemia and thrombocytopenia, so we recommend continued observation.  We will plan to see her back in 3 months for repeat clinical assessment.

## 2022-05-09 NOTE — Progress Notes (Signed)
Carelink Summary Report / Loop Recorder 

## 2022-05-13 DIAGNOSIS — E785 Hyperlipidemia, unspecified: Secondary | ICD-10-CM | POA: Diagnosis not present

## 2022-05-13 DIAGNOSIS — K219 Gastro-esophageal reflux disease without esophagitis: Secondary | ICD-10-CM | POA: Diagnosis not present

## 2022-05-13 DIAGNOSIS — Z8719 Personal history of other diseases of the digestive system: Secondary | ICD-10-CM | POA: Diagnosis not present

## 2022-05-13 DIAGNOSIS — Z6828 Body mass index (BMI) 28.0-28.9, adult: Secondary | ICD-10-CM | POA: Diagnosis not present

## 2022-05-13 DIAGNOSIS — H539 Unspecified visual disturbance: Secondary | ICD-10-CM | POA: Diagnosis not present

## 2022-05-13 DIAGNOSIS — Z8673 Personal history of transient ischemic attack (TIA), and cerebral infarction without residual deficits: Secondary | ICD-10-CM | POA: Diagnosis not present

## 2022-05-13 DIAGNOSIS — M19012 Primary osteoarthritis, left shoulder: Secondary | ICD-10-CM | POA: Diagnosis not present

## 2022-05-13 DIAGNOSIS — Z89419 Acquired absence of unspecified great toe: Secondary | ICD-10-CM | POA: Diagnosis not present

## 2022-05-13 DIAGNOSIS — I10 Essential (primary) hypertension: Secondary | ICD-10-CM | POA: Diagnosis not present

## 2022-05-13 DIAGNOSIS — M159 Polyosteoarthritis, unspecified: Secondary | ICD-10-CM | POA: Diagnosis not present

## 2022-05-20 ENCOUNTER — Ambulatory Visit: Payer: PPO | Admitting: Neurology

## 2022-05-20 ENCOUNTER — Encounter: Payer: Self-pay | Admitting: Neurology

## 2022-05-20 ENCOUNTER — Ambulatory Visit (INDEPENDENT_AMBULATORY_CARE_PROVIDER_SITE_OTHER): Payer: PPO

## 2022-05-20 VITALS — BP 131/82 | HR 66 | Ht 65.0 in | Wt 162.2 lb

## 2022-05-20 DIAGNOSIS — I639 Cerebral infarction, unspecified: Secondary | ICD-10-CM

## 2022-05-20 DIAGNOSIS — R29898 Other symptoms and signs involving the musculoskeletal system: Secondary | ICD-10-CM | POA: Diagnosis not present

## 2022-05-20 DIAGNOSIS — Z8673 Personal history of transient ischemic attack (TIA), and cerebral infarction without residual deficits: Secondary | ICD-10-CM | POA: Diagnosis not present

## 2022-05-20 NOTE — Progress Notes (Signed)
[facility] [address] [place]. [place] [address] [phone]       OFFICE CONSULT NOTE  Ms. THRESSA SHIFFER Date of Birth:  November 12, 1940 Medical Record Number:  [id number]   Referring MD: [name]  Reason for Referral: Stroke  HPI: Ms. Lavery is a pleasant 81 year old Caucasian lady seen today for initial office consultation visit for stroke.  She is accompanied by her daughter today.  History is obtained from them and review referral notes approximately reporting available imaging films in PACS.  She has past medical history of hypertension, hyperlipidemia, coronary artery disease, arthritis, asthma essential thrombocytopenia:, History of subdural traumatic hemorrhage May 22, history of bilateral cerebral and cerebral embolic infarcts in April 2021 of cryptogenic etiology and loop recorder placed and so far paroxysmal A. fib has not been found..  She was admitted in May 2022 with repeated falls and CT scan of the head showed falcine subdural hematoma and possibly subacute left thalamic stroke MRI was subsequently performed which did not show an acute stroke but also showed subacute looking scattered bilateral embolic strokes which were small.  CT scan chest abdomen pelvis was negative for malignancy.  LDL cholesterol was 25 mg percent she had done well except for mild residual right hand and numbness and diminished fine motor skills..   She was admitted on 02/18/2022 to [facility] with confusion and memory issues as well as increased urinary frequency.  On the day of admission she was not able to tell her phone number and address which is unusual for her.  She has had multiple similar prior presentations in the past which was found to have UTI.  Work-up in the emergency room was significant for possible UTI.  She was empirically treated with Rocephin.  CT scan showed no acute stroke however subsequently MRI scan showed multiple small microembolic strokes involving  bilateral cerebellum and cerebellum.  CT angiogram showed no large vessel stenosis or lesion.  Transthoracic echo showed ejection fraction of 60 to 65% without definite cardiac source embolism.  Patient had a loop recorder inserted during one of the previous strokes 4 years ago so far paroxysmal A-fib has not yet been found.  EEG showed diffuse slowing but no definite epileptiform activity.  White count was slightly elevated at 16.9 L 35.9 platelet count was 108.  LDL cholesterol 41.4.  Vitamin B12, TSH, folate and ammonia were all normal.  Patient was on aspirin prior to admission and Plavix was added continued.  She is tolerating both medications well without significant bleeding or bruising.  She however has a traumatic subdural hemorrhage in May 2022 at that time Plavix was discontinued.  She states she has not been feeling discharge but still has some diminished motor skills in the hand and the left numbness on the tips of the fingers in the right hand.  She is also noticed that her walking is not back to baseline.  She does use a walker but has to be careful.  She refused physical and Occupational Therapy after discharge as she has had a couple of times in the past and found not to be of much benefit.  Patient lives alone but she has some help.  Continue with coordination of care.  She states her memory is fine and most of her activities by herself.  Her son and daughter provide close support. "  She has not had any recent falls or injuries  ROS:   14 system review of systems is positive for numbness,  tingling, lack of dexterity, decreased hearing, decreased vision, gait imbalance and frequent falls and all other systems negative  PMH:  Past Medical History:  Diagnosis Date   Abdominal pain    Accelerating angina ([place]) 08/26/2019   Acute blood loss anemia 12/14/2019   Acute cardioembolic stroke ([place]) 11/07/2246   Allergic rhinitis 06/21/2014   Anxiety    Arthritis    Asthma    Atherosclerosis  of arteries 08/24/2019   Bilateral edema of lower extremity    Body mass index (BMI) 25.0-25.9, adult 08/24/2019   CAD (coronary artery disease) 10/25/2019   Cellulitis of right leg    Chronic low back pain 01/27/2018   Combined hyperlipidemia 25/00/3704   Complication of anesthesia    difficulty waking   Congestive heart failure (CHF) ([place])    COPD (chronic obstructive pulmonary disease) ([place])    Coronary artery disease    Coronary artery disease involving native heart without angina pectoris    Dyspnea 02/09/2019   Dyspnea on exertion    Dysuria 03/27/8915   Embolic stroke ([place]) 94/50/3888   Essential (primary) hypertension 02/20/2021   Essential thrombocythemia ([place]) 06/22/2014   Essential thrombocytosis ([place]) 06/22/2014   Fall at home, initial encounter 12/21/2020   Gastrointestinal bleeding 06/23/2018   GERD (gastroesophageal reflux disease)    GERD without esophagitis 06/21/2014   History of melanoma excision    left greast toe 2015   History of squamous cell carcinoma excision    face--  multiple excisions   History of subdural hematoma    2008   HTN (hypertension) 06/21/2014   Hypertension    Ischial bursitis, right 08/24/2019   Leucocytosis 12/14/2019   Leukocytosis 12/14/2019   Malignant melanoma of great toe ([place]) 08/24/2019   Melanoma in situ ([place]) 06/29/2014   Mixed hyperlipidemia 08/24/2019   Opioid dependence ([place]) 08/24/2019   OSA (obstructive sleep apnea)    intolerant   Pedal edema 08/24/2019   Pneumonia of both lungs due to infectious organism 12/20/2020   Renal insufficiency 08/24/2019   S/P CABG x 4 11/04/2019   Scoliosis (and kyphoscoliosis), idiopathic 01/27/2018   Skin cancer 08/24/2019   Solitary pulmonary nodule on lung CT 02/09/2019   Squamous cell carcinoma, scalp/neck 08/24/2019   Stroke ([place]) 12/21/2020   Stroke due to embolism ([place]) 11/25/2019   Subdural hematoma ([place]) 12/20/2020   Swelling of limb 08/24/2019   Thrombocytosis    takes  hydoxyurea--  MONITORED BY DR Hinton Rao [facility])   Traumatic subdural hemorrhage with loss of consciousness of unspecified duration, initial encounter ([place]) 02/20/2021   VIN III (vulvar intraepithelial neoplasia III)    Vitamin B 12 deficiency 08/24/2019   Vitamin D deficiency 08/24/2019   Wears dentures    UPPER AND LOWER PARTIAL   Wears glasses     Social History:  Social History   Socioeconomic History   Marital status: Married    Spouse name: Not on file   Number of children: Not on file   Years of education: Not on file   Highest education level: Not on file  Occupational History   Not on file  Tobacco Use   Smoking status: Never   Smokeless tobacco: Never  Vaping Use   Vaping Use: Never used  Substance and Sexual Activity   Alcohol use: No   Drug use: No   Sexual activity: Not Currently  Other Topics Concern   Not on file  Social History Narrative   Not on file   Social Determinants  of Health   Financial Resource Strain: Not on file  Food Insecurity: No Food Insecurity (04/29/2022)   Hunger Vital Sign    Worried About Running Out of Food in the Last Year: Never true    Ran Out of Food in the Last Year: Never true  Transportation Needs: No Transportation Needs (04/29/2022)   PRAPARE - Hydrologist (Medical): No    Lack of Transportation (Non-Medical): No  Physical Activity: Not on file  Stress: Not on file  Social Connections: Not on file  Intimate Partner Violence: Not on file    Medications:   Current Outpatient Medications on File Prior to Visit  Medication Sig Dispense Refill   acetaminophen (TYLENOL) 325 MG tablet Take 1-2 tablets (325-650 mg total) by mouth every 4 (four) hours as needed for mild pain.     ammonium lactate (LAC-HYDRIN) 12 % lotion Apply 1 application topically as needed for dry skin.     Ascorbic Acid (VITAMIN C) 1000 MG tablet Take 1,000 mg by mouth daily.     aspirin EC 81 MG tablet Take 1  tablet (81 mg total) by mouth daily. Swallow whole. 90 tablet 3   atorvastatin (LIPITOR) 20 MG tablet TAKE 1 TABLET BY MOUTH EVERY DAY 90 tablet 1   fluticasone (FLONASE) 50 MCG/ACT nasal spray Place 2 sprays into both nostrils at bedtime.      lactobacillus acidophilus (BACID) TABS tablet Take 1 tablet by mouth daily.     magnesium oxide (MAG-OX) 400 MG tablet Take 400 mg by mouth daily.     meclizine (ANTIVERT) 12.5 MG tablet Take 12.5 mg by mouth 2 (two) times daily as needed for dizziness.     metoprolol tartrate (LOPRESSOR) 25 MG tablet Take 1 tablet (25 mg total) by mouth 2 (two) times daily. 180 tablet 1   Multiple Vitamins-Minerals (PRESERVISION AREDS 2) CAPS Take 1 capsule by mouth in the morning and at bedtime.      omeprazole (PRILOSEC) 20 MG capsule Take 20 mg by mouth daily.     vitamin B-12 (CYANOCOBALAMIN) 500 MCG tablet Take 1 tablet (500 mcg total) by mouth daily.     Vitamin D, Ergocalciferol, (DRISDOL) 1.25 MG (50000 UT) CAPS capsule Take 50,000 Units by mouth every 14 (fourteen) days.     Wheat Dextrin (BENEFIBER PO) Take 1 tablet by mouth daily.     No current facility-administered medications on file prior to visit.    Allergies:   Allergies  Allergen Reactions   Cephalexin Hives   Penicillin G    Ciprofloxacin Other (See Comments)    Gi intolerance   Penicillins Rash    Childhood reaction Did it involve swelling of the face/tongue/throat, SOB, or low BP? No Did it involve sudden or severe rash/hives, skin peeling, or any reaction on the inside of your mouth or nose? No Did you need to seek medical attention at a hospital or doctor's office? No When did it last happen?      50 years ago If all above answers are "NO", may proceed with cephalosporin use.     Physical Exam General: well developed, well nourished, elderly Caucasian lady in no evident distress Head: head normocephalic and atraumatic.   Neck: supple with no carotid or supraclavicular  bruits Cardiovascular: regular rate and rhythm, no murmurs Musculoskeletal: Mild kyphoscoliosis Skin:  no rash/petichiae.  Left lower eyelid shows some reverse ptosis with conjunctival erythema which is chronic Vascular:  Normal pulses all extremities  Neurologic Exam Mental Status: Awake and fully alert. Oriented to place and time. Recent and remote memory intact. Attention span, concentration and fund of knowledge appropriate. Mood and affect appropriate.  Cranial Nerves: Fundoscopic exam not done.  Diminished vision acuity in the left eye.  Right pupil briskly reactive to light.  Left pupil is irregular and sluggishly reactive.  Extraocular movements full without nystagmus. Visual fields full to confrontation. Hearing i diminished bilaterally left greater than right t. Facial sensation intact. Face, tongue, palate moves normally and symmetrically.  Motor: Normal bulk and tone. Normal strength in all tested extremity muscles. Sensory.: intact to touch , pinprick , position and vibratory sensation except mild decrease In pigmentation over the fingertips in the right hand. Coordination: Rapid alternating movements normal in all extremities. Finger-to-nose and heel-to-shin performed accurately bilaterally. Gait and Station: Arises from chair without difficulty. Stance is normal. Gait demonstrates slight favoring of the left ankle due to pain again.  Unable to walk tandem without difficulty  reflexes: 1+ and symmetric. Toes downgoing.   NIHSS  0 Modified Rankin  2   ASSESSMENT: 81 year old Caucasian lady with bilateral anterior and posterior circulation small embolic strokes in July 1314 of cryptogenic etiology. Vascular risk factors hypertension, hyperlipidemia , coronary artery disease and previous strokes in April 2021.    PLAN:I had a long d/w patient and her daughter about her recent cryptogenic strokes, h/o subdural hematoma and chronic thrombocytopenia and increased bleeding risk,, risk  for recurrent stroke/TIAs, personally independently reviewed imaging studies and stroke evaluation results and answered questions.she has a loop recorder inserted but so far paroxysmal A-fib has not yet been found.  Continue aspirin 81 mg daily  but stop plavix now due to increased bleeding risk for secondary stroke prevention and maintain strict control of hypertension with blood pressure goal below 130/90, diabetes with hemoglobin A1c goal below 6.5% and lipids with LDL cholesterol goal below 70 mg/dL. I also advised the patient to eat a healthy diet with plenty of whole grains, cereals, fruits and vegetables, exercise regularly and maintain ideal body weight Followup in the future with me in 6 months or call earlier if needed. Greater than 50% time during this 45-minute consultation was appropriate in counseling and coordination of care stroke and discussion about stroke evaluation, patient Antony Contras, MD Note: This document was prepared with digital dictation and possible smart phrase technology. Any transcriptional errors that result from this process are unintentional.

## 2022-05-20 NOTE — Patient Instructions (Signed)
I had a long d/w patient and her daughter about her recent cryptogenic strokes, h/o subdural hematoma and chronic thrombocytopenia and increased bleeding risk,, risk for recurrent stroke/TIAs, personally independently reviewed imaging studies and stroke evaluation results and answered questions.Continue aspirin 81 mg daily  but stop plavix now due to increased bleeding risk for secondary stroke prevention and maintain strict control of hypertension with blood pressure goal below 130/90, diabetes with hemoglobin A1c goal below 6.5% and lipids with LDL cholesterol goal below 70 mg/dL. I also advised the patient to eat a healthy diet with plenty of whole grains, cereals, fruits and vegetables, exercise regularly and maintain ideal body weight Followup in the future with me in 6 months or call earlier if needed.  Stroke Prevention Some medical conditions and behaviors can lead to a higher chance of having a stroke. You can help prevent a stroke by eating healthy, exercising, not smoking, and managing any medical conditions you have. Stroke is a leading cause of functional impairment. Primary prevention is particularly important because a majority of strokes are first-time events. Stroke changes the lives of not only those who experience a stroke but also their family and other caregivers. How can this condition affect me? A stroke is a medical emergency and should be treated right away. A stroke can lead to brain damage and can sometimes be life-threatening. If a person gets medical treatment right away, there is a better chance of surviving and recovering from a stroke. What can increase my risk? The following medical conditions may increase your risk of a stroke: Cardiovascular disease. High blood pressure (hypertension). Diabetes. High cholesterol. Sickle cell disease. Blood clotting disorders (hypercoagulable state). Obesity. Sleep disorders (obstructive sleep apnea). Other risk factors  include: Being older than age 62. Having a history of blood clots, stroke, or mini-stroke (transient ischemic attack, TIA). Genetic factors, such as race, ethnicity, or a family history of stroke. Smoking cigarettes or using other tobacco products. Taking birth control pills, especially if you also use tobacco. Heavy use of alcohol or drugs, especially cocaine and methamphetamine. Physical inactivity. What actions can I take to prevent this? Manage your health conditions High cholesterol levels. Eating a healthy diet is important for preventing high cholesterol. If cholesterol cannot be managed through diet alone, you may need to take medicines. Take any prescribed medicines to control your cholesterol as told by your health care provider. Hypertension. To reduce your risk of stroke, try to keep your blood pressure below 130/80. Eating a healthy diet and exercising regularly are important for controlling blood pressure. If these steps are not enough to manage your blood pressure, you may need to take medicines. Take any prescribed medicines to control hypertension as told by your health care provider. Ask your health care provider if you should monitor your blood pressure at home. Have your blood pressure checked every year, even if your blood pressure is normal. Blood pressure increases with age and some medical conditions. Diabetes. Eating a healthy diet and exercising regularly are important parts of managing your blood sugar (glucose). If your blood sugar cannot be managed through diet and exercise, you may need to take medicines. Take any prescribed medicines to control your diabetes as told by your health care provider. Get evaluated for obstructive sleep apnea. Talk to your health care provider about getting a sleep evaluation if you snore a lot or have excessive sleepiness. Make sure that any other medical conditions you have, such as atrial fibrillation or atherosclerosis, are  managed. Nutrition  Follow instructions from your health care provider about what to eat or drink to help manage your health condition. These instructions may include: Reducing your daily calorie intake. Limiting how much salt (sodium) you use to 1,500 milligrams (mg) each day. Using only healthy fats for cooking, such as olive oil, canola oil, or sunflower oil. Eating healthy foods. You can do this by: Choosing foods that are high in fiber, such as whole grains, and fresh fruits and vegetables. Eating at least 5 servings of fruits and vegetables a day. Try to fill one-half of your plate with fruits and vegetables at each meal. Choosing lean protein foods, such as lean cuts of meat, poultry without skin, fish, tofu, beans, and nuts. Eating low-fat dairy products. Avoiding foods that are high in sodium. This can help lower blood pressure. Avoiding foods that have saturated fat, trans fat, and cholesterol. This can help prevent high cholesterol. Avoiding processed and prepared foods. Counting your daily carbohydrate intake.  Lifestyle If you drink alcohol: Limit how much you have to: 0-1 drink a day for women who are not pregnant. 0-2 drinks a day for men. Know how much alcohol is in your drink. In the [place] one drink equals one 12 oz bottle of beer (345m), one 5 oz glass of wine (1453m, or one 1 oz glass of hard liquor (4446m Do not use any products that contain nicotine or tobacco. These products include cigarettes, chewing tobacco, and vaping devices, such as e-cigarettes. If you need help quitting, ask your health care provider. Avoid secondhand smoke. Do not use drugs. Activity  Try to stay at a healthy weight. Get at least 30 minutes of exercise on most days, such as: Fast walking. Biking. Swimming. Medicines Take over-the-counter and prescription medicines only as told by your health care provider. Aspirin or blood thinners (antiplatelets or anticoagulants) may be recommended  to reduce your risk of forming blood clots that can lead to stroke. Avoid taking birth control pills. Talk to your health care provider about the risks of taking birth control pills if: You are over 35 2ars old. You smoke. You get very bad headaches. You have had a blood clot. Where to find more information American Stroke Association: www.strokeassociation.org Get help right away if: You or a loved one has any symptoms of a stroke. "BE FAST" is an easy way to remember the main warning signs of a stroke: B - Balance. Signs are dizziness, sudden trouble walking, or loss of balance. E - Eyes. Signs are trouble seeing or a sudden change in vision. F - Face. Signs are sudden weakness or numbness of the face, or the face or eyelid drooping on one side. A - Arms. Signs are weakness or numbness in an arm. This happens suddenly and usually on one side of the body. S - Speech. Signs are sudden trouble speaking, slurred speech, or trouble understanding what people say. T - Time. Time to call emergency services. Write down what time symptoms started. You or a loved one has other signs of a stroke, such as: A sudden, severe headache with no known cause. Nausea or vomiting. Seizure. These symptoms may represent a serious problem that is an emergency. Do not wait to see if the symptoms will go away. Get medical help right away. Call your local emergency services (911 in the [place] Do not drive yourself to the hospital. Summary You can help to prevent a stroke by eating healthy, exercising, not smoking, limiting alcohol intake, and managing any medical  conditions you may have. Do not use any products that contain nicotine or tobacco. These include cigarettes, chewing tobacco, and vaping devices, such as e-cigarettes. If you need help quitting, ask your health care provider. Remember "BE FAST" for warning signs of a stroke. Get help right away if you or a loved one has any of these signs. This information  is not intended to replace advice given to you by your health care provider. Make sure you discuss any questions you have with your health care provider. Document Revised: 03/06/2020 Document Reviewed: 03/06/2020 Elsevier Patient Education  [place].

## 2022-05-21 LAB — CUP PACEART REMOTE DEVICE CHECK
Date Time Interrogation Session: 20231001231103
Implantable Pulse Generator Implant Date: 20210413

## 2022-06-05 NOTE — Progress Notes (Signed)
Carelink Summary Report / Loop Recorder 

## 2022-06-11 DIAGNOSIS — Z23 Encounter for immunization: Secondary | ICD-10-CM | POA: Diagnosis not present

## 2022-06-18 ENCOUNTER — Institutional Professional Consult (permissible substitution): Payer: PPO | Admitting: Neurology

## 2022-06-24 ENCOUNTER — Ambulatory Visit: Payer: PPO | Attending: *Deleted

## 2022-06-24 DIAGNOSIS — I639 Cerebral infarction, unspecified: Secondary | ICD-10-CM | POA: Diagnosis not present

## 2022-06-25 LAB — CUP PACEART REMOTE DEVICE CHECK
Date Time Interrogation Session: 20231103230718
Implantable Pulse Generator Implant Date: 20210413

## 2022-07-29 ENCOUNTER — Ambulatory Visit (INDEPENDENT_AMBULATORY_CARE_PROVIDER_SITE_OTHER): Payer: PPO

## 2022-07-29 DIAGNOSIS — I639 Cerebral infarction, unspecified: Secondary | ICD-10-CM | POA: Diagnosis not present

## 2022-07-29 LAB — CUP PACEART REMOTE DEVICE CHECK
Date Time Interrogation Session: 20231210231607
Implantable Pulse Generator Implant Date: 20210413

## 2022-07-30 NOTE — Progress Notes (Signed)
Carelink Summary Report / Loop Recorder 

## 2022-07-31 NOTE — Progress Notes (Signed)
Yolanda Flowers  [address] [place],  [place]  [address] [phone]  Clinic Day:  08/02/22   Referring physician: [name], MD  ASSESSMENT & PLAN:   Assessment & Plan: Essential thrombocythemia [facility]) Essential thrombocythemia previously treated with hydroxyurea.  Due to low normal platelets, hydroxyurea was discontinued in October 2021.  The platelets  remain stable.  She has leukocytosis/neutrophilia, which she has had on and off since 2018.  This is felt to be due to her myeloproliferative disease.    Anemia This is worse, her hemoglobin last one was 11.8, but recent vitamin levels looked good.   History of vulvar cancer Treated with surgical excision.   Right upper lobe nodule  Pulmonology is following, but CT imaging in July 2021 revealed stable pulmonary nodules.   History of melanoma of the great toe Treated with surgical excision.  She remains without evidence of recurrence   Status post CABG x4 in March 2021. Status post CVA x2 in March and April 2021.  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   Calcifications in the right breast in March 2021  Unilateral diagnostic mammogram from July characterized these as likely benign. She is overdue for bilateral diagnostic mammogram at this time.  I recommended that she have it, even at the age of 42, since breast cancer risk increases with age.    CVA May 2022.   She has recovered but still has some extremity weakness of the right hand.   Acute blood loss anemia, January 2023.  The patient was admitted with an esophageal tear.  Her hemoglobin dropped to 6.9 during admission, and so she received 1 unit of packed red blood cells with improvement in her hemoglobin to 8.2. She was up to 10.2 when she was seen on February 1st and continued to improve.    Plan Her labs are currently pending. I will see her back in 3 months with CBC and CMP  labs. We are getting a urinanalysis and culture because of symptoms of nocturia and frequency. The patient understands the plans discussed today and is in agreement with them.  She knows to contact our office if she develops concerns prior to her next appointment.    I provided 20 minutes of face-to-face time during this encounter and > 50% was spent counseling as documented under my assessment and plan.    ADDENDUM: Her WBC is stable at 18.6, hemoglobin a little lower at 11.3, and platelets are slightly better at 135,000.   Derwood Kaplan, MD  [facility] [address] [place] [address] Dept: [phone] Dept Fax: [phone]   No orders of the defined types were placed in this encounter.     CHIEF COMPLAINT:  CC: Essential thrombocythemia  Current Treatment: Observation  HISTORY OF PRESENT ILLNESS:  Yolanda Flowers is a 81 year old female with a history of essential thrombocythemia originally diagnosed in 42.  She was treated with hydroxyurea 500 mg 3 times daily.  We have had to occasionally adjust doses because of leukopenia or anemia.  She also had a malignant melanoma of her left first toe resected in November 2015 with partial amputation of the toe and has done well.  She has had multiple other skin cancers removed over the years.  In March 2017, she was seen by Dr. Denman George for HSIL/VIN 3 of the right posterior labia minora and underwent  vulvectomy.  Hydroxyurea was decreased to 500 mg twice daily prior to surgery, as we did not feel we could safely hold hydroxyurea.  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  Bone density scan in March 2018 revealed osteopenia with a T-score of -1.6 in the femur, for which she is on calcium and vitamin-D.  She has had chronic back pain, which is attributed to severe degenerative  disease.  She had an MRI of the lumbar spine in March 2018, which revealed significant degenerative disc disease with mild spinal stenosis at L1 and L4, but moderate at L2-3.  Dr. Maryjean Ka has given her injections, as well at hydrocodone/APAP 5/325.  She has had intermittent leukocytosis since 2018, felt to be secondary to her essential thrombocythemia. She was evaluated in the emergency department in October 2019 and had a GI bleed. CT abdomen and pelvis revealed acute uncomplicated diverticulitis of the sigmoid colon with no other acute abnormalities.  A moderate to large hiatal hernia was seen. She saw Dr. Geraldo Pitter and underwent nuclear med stress test in November, which did not reveal any evidence of inducible ischemia or other abnormality.  Left ventricular size and function was normal with an ejection fraction of 79%.  She underwent EGD and colonoscopy in December  2019, and no significant source of blood loss was identified. She was instructed to continue lansoprazole.  She had dilatation of an esophageal stricture and removal of a polyp. Pathology revealed a fragmented tubular adenoma.    In January 2020, she had worsening anemia, in addition to continued episodes of chest pain and worsening dyspnea with exertion at her routine follow-up. Due to the severe dyspnea and right lower extremity edema, we obtained a CT angiogram chest, which did not reveal any evidence of pulmonary embolism.  There was good opacification of the pulmonary arteries.  Diffuse coronary artery calcifications with cardiomegaly and moderate thoracic aortic atherosclerosis was seen.  There was a 3.6 mm noncalcified nodule in the anterior inferior right upper lobe.  Non-contrasted CT chest in 6-12 months was recommended.  Right lower extremity venous Doppler ultrasound did not reveal any evidence of deep venous thrombosis. In February 2020, she had persistent anemia with a hemoglobin of 9.7. As she was symptomatic, hydroxyurea 500 mg was  decreased to once daily.  Soluble transferrin was elevated, which was consistent with iron deficiency.  She saw Dr. Lyda Jester and he placed her on iron supplementation daily in the form of ferrous sulfate 65 mg. She was seen again in March and had improvement in her anemia with a hemoglobin of 10.2.  Her thrombocythemia remained controlled on hydroxyurea 500 mg daily. We encouraged her to have follow-up for her vulvar cancer, so followed up with Dr. Carlena Bjornstad and her exam was normal.     She had a bilateral screening mammogram in February 2021, which revealed an area of calcifications in the right breast warranting further evaluation.  A diagnostic right mammogram was ordered, but this was delayed, because she underwent open heart surgery with CABG x4 in March.  She had a monitor placed in the left chest wall the time of surgery.  She did not feel she can have mammogram due to the monitor in the left chest wall.  She then had 2 strokes following her surgery, from which she has recovered well, except for balance issues and left eye blindness.  She ambulates with a cane or walker depending on her circumstances.  She is on clopidogrel 75 mg daily, atorvastatin 80 mg daily, metoprolol 25 mg twice daily and pantoprazole 40 mg daily. She had a CT chest in July to follow-up on the pulmonary nodule which were stable. She finally underwent diagnostic right mammogram from July which revealed likely benign 6 mm group of calcifications involving the upper inner quadrant at middle depth. Bilateral diagnostic mammogram in February 2022 was recommended  At her visit in October 2021, she reported elevated transaminases and purpura, which was attributed to atorvastatin.  Dr. Geraldo Pitter reduced the dose from 80 mg to 40 mg with normalization of the transaminases and no further purpura.  Her platelets were in low normal range, so hydroxyurea was held and since her platelets have remained normal, hydroxyurea was discontinued.  She has had intermittent mild leukocytosis/neutrophilia since 2018.  She has occasionally had immature cells on her blood smear, but not blasts.    When I saw her in May of 2022, she had worsening leukocytosis with persistent immature cells and worsening anemia, so I recommended a bone marrow biopsy, but this was never done as the patient was hospitalized with a stroke and discharged to rehab. She was seen again in August, at which time her white count had decreased from 24.2 to 19.9 with an Bertsch-[place] of 1473, with 11% lymphocytes, 10% monocytes and 2.4 % basophils and her hemoglobin had improved from 10.6 to 11.3. The platelet count remained normal, so continued observation was recommended.  Her blood counts were stable in November.   She was admitted in January of 2023 with a 3 day history of sensation of an object stuck in her throat following attempted swallowing of calcium supplementation. She was eventually able to dislodge item after several hours.  She then developed stool that she described as "motor oil" for 2 days. She was evaluated in the emergency room and was found to have hemoglobin of 8.3 decreased from 11.4 in November 2022.   She underwent urgent EGD and was found to have a 3 cm mucosal tear within the distal 5 cm of her esophagus, felt to be most likely secondary to mucosal tear following calcium carbonate impaction. CT neck and chest was performed to evaluate for possibility of mediastinitis developing following possible full focus mucosal tear. This imaging did not reveal active inflammatory changes. 3 clips were placed closing the tear successfully and complete hemostasis was achieved.  She was placed on twice daily pantoprazole with slow advancement of diet from clear liquid to house select over 2 days.  She had an acute drop in hemoglobin to 6.9 following further equilibration, so received 1 unit PRBC with appropriate response to 8.2 g/dL. Her hemoglobin remained at approximately 8 for the  remainder of admission.  She was instructed to take iron supplementation while at home and to avoid calcium carbonate for time being and to transition to over-the-counter chewable supplementation.  Iron/TIBC did not reveal iron deficiency. Review her blood smear in March revealed occasional myelocytes, occasional nucleated RBC's, poikilocytosis, and 1 promyelocyte.   INTERVAL HISTORY:  Yolanda Flowers is here today for repeat clinical assessment for essential thrombocythemia. Patient states she has been doing fairly well however, that her "kidneys" are giving her issues. She says she gets up frequently in the middle of the night or doesn't make it occasionally. I suggested that we take a urine sample today for UA, C&S. I will call her with the results. She also states having some trouble with her memory since her strokes. She denies signs  of infection such as sore throat, sinus drainage, cough, or urinary symptoms.  She denies fevers or recurrent chills. She denies pain. She denies nausea, vomiting, chest pain, dyspnea or cough. Her weight has decreased 2 pounds over last 3 months .  REVIEW OF SYSTEMS:  Review of Systems  Constitutional:  Negative for appetite change, chills, fatigue, fever and unexpected weight change.  HENT:  Negative.  Negative for lump/mass, mouth sores and sore throat.   Eyes: Negative.   Respiratory: Negative.  Negative for chest tightness, cough, hemoptysis, shortness of breath and wheezing.   Cardiovascular: Negative.  Negative for chest pain, leg swelling and palpitations.  Gastrointestinal: Negative.  Negative for abdominal distention, abdominal pain, blood in stool, constipation, diarrhea, nausea and vomiting.  Endocrine: Negative.  Negative for hot flashes.  Genitourinary:  Positive for difficulty urinating (frequent and toublesome.). Negative for dysuria, frequency and hematuria.   Musculoskeletal: Negative.  Negative for arthralgias, back pain, flank pain, gait problem and  myalgias.  Skin: Negative.  Negative for rash.  Neurological:  Positive for dizziness. Negative for extremity weakness, gait problem, headaches, light-headedness, numbness, seizures and speech difficulty.  Hematological: Negative.  Negative for adenopathy. Does not bruise/bleed easily.  Psychiatric/Behavioral:  Positive for confusion. Negative for depression and sleep disturbance. The patient is not nervous/anxious.      VITALS:  Blood pressure (!) 168/87, pulse 60, temperature 97.8 F (36.6 C), temperature source Oral, resp. rate 18, height _0  (1.651 m), weight 159 lb 9.6 oz (72.4 kg), SpO2 96 %.  Wt Readings from Last 3 Encounters:  08/02/22 159 lb 9.6 oz (72.4 kg)  05/20/22 162 lb 3.2 oz (73.6 kg)  05/03/22 161 lb 8 oz (73.3 kg)    Body mass index is 26.56 kg/m.  Performance status (ECOG): 1  PHYSICAL EXAM:  Physical Exam Vitals and nursing note reviewed.  Constitutional:      General: She is not in acute distress.    Appearance: Normal appearance. She is normal weight.  HENT:     Head: Normocephalic and atraumatic.     Nose: Nose normal.     Mouth/Throat:     Mouth: Mucous membranes are moist.     Pharynx: Oropharynx is clear. No oropharyngeal exudate or posterior oropharyngeal erythema.  Eyes:     General: No scleral icterus.    Extraocular Movements: Extraocular movements intact.     Conjunctiva/sclera: Conjunctivae normal.     Pupils: Pupils are equal, round, and reactive to light.  Cardiovascular:     Rate and Rhythm: Normal rate and regular rhythm.     Pulses: Normal pulses.     Heart sounds: Normal heart sounds. No murmur heard.    No friction rub. No gallop.  Pulmonary:     Effort: Pulmonary effort is normal. No respiratory distress.     Breath sounds: Normal breath sounds. No wheezing, rhonchi or rales.  Abdominal:     General: Bowel sounds are normal. There is no distension.     Palpations: Abdomen is soft. There is no hepatomegaly, splenomegaly or mass.      Tenderness: There is no abdominal tenderness.  Musculoskeletal:        General: Normal range of motion.     Cervical back: Normal range of motion and neck supple. No tenderness.     Right lower leg: Edema (1+ edema that is mild, chronic and stable) present.     Left lower leg: No edema.  Lymphadenopathy:     Cervical: No cervical  adenopathy.     Right cervical: No superficial, deep or posterior cervical adenopathy.    Left cervical: No superficial, deep or posterior cervical adenopathy.     Upper Body:     Right upper body: No supraclavicular, axillary or pectoral adenopathy.     Left upper body: No supraclavicular, axillary or pectoral adenopathy.     Lower Body: No right inguinal adenopathy. No left inguinal adenopathy.  Skin:    General: Skin is warm and dry.     Coloration: Skin is not jaundiced.     Findings: No lesion or rash. Erythema: Left cheek more so than the other surgical areas.    Comments: She has multiple keratotic lesions   Neurological:     General: No focal deficit present.     Mental Status: She is alert and oriented to person, place, and time. Mental status is at baseline.     Cranial Nerves: No cranial nerve deficit.  Psychiatric:        Mood and Affect: Mood normal.        Behavior: Behavior normal.        Thought Content: Thought content normal.        Judgment: Judgment normal.     LABS:      Latest Ref Rng & Units 08/02/2022    1:36 PM 05/03/2022   12:00 AM 02/15/2022   12:00 AM  CBC  WBC 4.0 - 10.5 K/uL 18.6  19.9     18.4      Hemoglobin 12.0 - 15.0 g/dL 11.3  11.8     11.3      Hematocrit 36.0 - 46.0 % 36.4  36     35      Platelets 150 - 400 K/uL 135  129     124         This result is from an external source.      Latest Ref Rng & Units 05/03/2022   12:00 AM 10/17/2021   12:00 AM 09/19/2021   12:00 AM  CMP  BUN 4 - _0 Creatinine 0.5 - 1.1 1.0     1.0  1.2      Sodium 137 - 147 142     143  143      Potassium 3.5 -  5.1 mEq/L 4.5     4.5  4.1      Chloride 99 - 108 109     108  107      CO2 13 - _1 Calcium 8.7 - 10.7 8.8     8.7  8.8      Alkaline Phos 25 - 125 76     80  76      AST 13 - 35 36     30  31      ALT 7 - 35 U/L _2 This result is from an external source.     No results found for: "CEA1", "CEA" / No results found for: "CEA1", "CEA" No results found for: "PSA1" No results found for: "YNW295" No results found for: "CAN125"  No results found for: "TOTALPROTELP", "ALBUMINELP", "A1GS", "A2GS", "BETS", "BETA2SER", "GAMS", "MSPIKE", "SPEI" Lab Results  Component Value Date   TIBC 484 (H) 10/17/2021   TIBC 358 07/10/2021  TIBC 305 12/18/2020   FERRITIN 16 10/17/2021   FERRITIN 29 09/19/2021   FERRITIN 59 07/10/2021   IRONPCTSAT 18 10/17/2021   IRONPCTSAT 26 07/10/2021   IRONPCTSAT 15.4 12/18/2020   No results found for: "LDH"  STUDIES:  No results found.    HISTORY:   Past Medical History:  Diagnosis Date   Abdominal pain    Accelerating angina ([place]) 08/26/2019   Acute blood loss anemia 12/14/2019   Acute cardioembolic stroke ([place]) 14/48/1856   Allergic rhinitis 06/21/2014   Anxiety    Arthritis    Asthma    Atherosclerosis of arteries 08/24/2019   Bilateral edema of lower extremity    Body mass index (BMI) 25.0-25.9, adult 08/24/2019   CAD (coronary artery disease) 10/25/2019   Cellulitis of right leg    Chronic low back pain 01/27/2018   Combined hyperlipidemia 31/49/7026   Complication of anesthesia    difficulty waking   Congestive heart failure (CHF) ([place])    COPD (chronic obstructive pulmonary disease) ([place])    Coronary artery disease    Coronary artery disease involving native heart without angina pectoris    Dyspnea 02/09/2019   Dyspnea on exertion    Dysuria 10/24/8586   Embolic stroke ([place]) 50/27/7412   Essential (primary) hypertension 02/20/2021   Essential thrombocythemia ([place]) 06/22/2014   Essential  thrombocytosis ([place]) 06/22/2014   Fall at home, initial encounter 12/21/2020   Gastrointestinal bleeding 06/23/2018   GERD (gastroesophageal reflux disease)    GERD without esophagitis 06/21/2014   History of melanoma excision    left greast toe 2015   History of squamous cell carcinoma excision    face--  multiple excisions   History of subdural hematoma    2008   HTN (hypertension) 06/21/2014   Hypertension    Ischial bursitis, right 08/24/2019   Leucocytosis 12/14/2019   Leukocytosis 12/14/2019   Malignant melanoma of great toe ([place]) 08/24/2019   Melanoma in situ ([place]) 06/29/2014   Mixed hyperlipidemia 08/24/2019   Opioid dependence ([place]) 08/24/2019   OSA (obstructive sleep apnea)    intolerant   Pedal edema 08/24/2019   Pneumonia of both lungs due to infectious organism 12/20/2020   Renal insufficiency 08/24/2019   S/P CABG x 4 11/04/2019   Scoliosis (and kyphoscoliosis), idiopathic 01/27/2018   Skin cancer 08/24/2019   Solitary pulmonary nodule on lung CT 02/09/2019   Squamous cell carcinoma, scalp/neck 08/24/2019   Stroke ([place]) 12/21/2020   Stroke due to embolism ([place]) 11/25/2019   Subdural hematoma ([place]) 12/20/2020   Swelling of limb 08/24/2019   Thrombocytosis    takes hydoxyurea--  MONITORED BY DR Hinton Rao [facility])   Traumatic subdural hemorrhage with loss of consciousness of unspecified duration, initial encounter ([place]) 02/20/2021   VIN III (vulvar intraepithelial neoplasia III)    Vitamin B 12 deficiency 08/24/2019   Vitamin D deficiency 08/24/2019   Wears dentures    UPPER AND LOWER PARTIAL   Wears glasses     Past Surgical History:  Procedure Laterality Date   ABDOMINAL HYSTERECTOMY  age 39   CARDIAC CATHETERIZATION  03/172021   CORONARY ARTERY BYPASS GRAFT N/A 11/04/2019   Procedure: CORONARY ARTERY BYPASS GRAFTING (CABG) x 4, with ENDOSCOPIC HARVESTING OF RIGHT GREATER SAPHENOUS VEIN.;  Surgeon: Gaye Pollack, MD;  Location: [facility];   Service: Open Heart Surgery;  Laterality: N/A;   INTRAVASCULAR ULTRASOUND/IVUS N/A 11/03/2019   Procedure: Intravascular Ultrasound/IVUS;  Surgeon: Nelva Bush, MD;  Location: [facility];  Service:  Cardiovascular;  Laterality: N/A;   KNEE ARTHROSCOPY Left 2004   LEFT HEART CATH AND CORONARY ANGIOGRAPHY N/A 11/03/2019   Procedure: LEFT HEART CATH AND CORONARY ANGIOGRAPHY;  Surgeon: Nelva Bush, MD;  Location: [facility];  Service: Cardiovascular;  Laterality: N/A;   LOOP RECORDER INSERTION N/A 11/30/2019   Procedure: LOOP RECORDER INSERTION;  Surgeon: Thompson Grayer, MD;  Location: [facility];  Service: Cardiovascular;  Laterality: N/A;   MELANOMA EXCISION  2015   left great toe   REPAIR PERONEAL TENDONS ANKLE  2004   SUBDURAL HEMATOMA EVACUATION VIA CRANIOTOMY  2008      week later  post-op  [facility]   TEE WITHOUT CARDIOVERSION N/A 11/04/2019   Procedure: TRANSESOPHAGEAL ECHOCARDIOGRAM (TEE);  Surgeon: Gaye Pollack, MD;  Location: [facility];  Service: Open Heart Surgery;  Laterality: N/A;   VULVECTOMY N/A 10/24/2015   Procedure: WIDE LOCAL EXCISION OF THE VULVA ;  Surgeon: Everitt Amber, MD;  Location: [facility];  Service: Gynecology;  Laterality: N/A;    Family History  Problem Relation Age of Onset   Clotting disorder Mother    Hypertension Mother    Heart disease Mother    Arthritis Mother    Stroke Father    Hypertension Father    Heart disease Father    Arthritis Father    Emphysema Father    Asthma Father    Kidney failure Father    Hypertension Sister    Heart disease Sister    Arthritis Sister    Hypertension Brother    Heart disease Brother    Arthritis Brother     Social History:  reports that she has never smoked. She has never used smokeless tobacco. She reports that she does not drink alcohol and does not use drugs.The patient is alone today.  Allergies:  Allergies  Allergen Reactions   Cephalexin Hives    Penicillin G    Ciprofloxacin Other (See Comments)    Gi intolerance   Penicillins Rash    Childhood reaction Did it involve swelling of the face/tongue/throat, SOB, or low BP? No Did it involve sudden or severe rash/hives, skin peeling, or any reaction on the inside of your mouth or nose? No Did you need to seek medical attention at a hospital or doctor's office? No When did it last happen?      50 years ago If all above answers are "NO", may proceed with cephalosporin use.     Current Medications: Current Outpatient Medications  Medication Sig Dispense Refill   oxybutynin (DITROPAN-XL) 10 MG 24 hr tablet Take 10 mg by mouth daily.     ammonium lactate (LAC-HYDRIN) 12 % lotion Apply 1 application topically as needed for dry skin.     aspirin EC 81 MG tablet Take 1 tablet (81 mg total) by mouth daily. Swallow whole. 90 tablet 3   atorvastatin (LIPITOR) 20 MG tablet TAKE 1 TABLET BY MOUTH EVERY DAY 90 tablet 1   fluticasone (FLONASE) 50 MCG/ACT nasal spray Place 2 sprays into both nostrils at bedtime.      magnesium oxide (MAG-OX) 400 MG tablet Take 400 mg by mouth daily.     meclizine (ANTIVERT) 12.5 MG tablet Take 12.5 mg by mouth 2 (two) times daily as needed for dizziness.     metoprolol tartrate (LOPRESSOR) 25 MG tablet Take 1 tablet (25 mg total) by mouth 2 (two) times daily. 180 tablet 1   Multiple Vitamins-Minerals (PRESERVISION AREDS 2)  CAPS Take 1 capsule by mouth in the morning and at bedtime.      nitrofurantoin, macrocrystal-monohydrate, (MACROBID) 100 MG capsule Take 1 capsule (100 mg total) by mouth 2 (two) times daily. 10 capsule 0   omeprazole (PRILOSEC) 20 MG capsule Take 20 mg by mouth daily.     vitamin B-12 (CYANOCOBALAMIN) 500 MCG tablet Take 1 tablet (500 mcg total) by mouth daily.     Vitamin D, Ergocalciferol, (DRISDOL) 1.25 MG (50000 UT) CAPS capsule Take 50,000 Units by mouth every 14 (fourteen) days.     Wheat Dextrin (BENEFIBER PO) Take 1 tablet by mouth  daily.     No current facility-administered medications for this visit.     I,Jasmine M Lassiter,acting as a scribe for Derwood Kaplan, MD.,have documented all relevant documentation on the behalf of Derwood Kaplan, MD,as directed by  Derwood Kaplan, MD while in the presence of Derwood Kaplan, MD.

## 2022-08-02 ENCOUNTER — Inpatient Hospital Stay: Payer: PPO

## 2022-08-02 ENCOUNTER — Other Ambulatory Visit: Payer: Self-pay | Admitting: Oncology

## 2022-08-02 ENCOUNTER — Other Ambulatory Visit: Payer: PPO

## 2022-08-02 ENCOUNTER — Encounter: Payer: Self-pay | Admitting: Oncology

## 2022-08-02 ENCOUNTER — Inpatient Hospital Stay: Payer: PPO | Attending: Oncology | Admitting: Oncology

## 2022-08-02 ENCOUNTER — Telehealth: Payer: Self-pay | Admitting: Oncology

## 2022-08-02 VITALS — BP 168/87 | HR 60 | Temp 97.8°F | Resp 18 | Ht 65.0 in | Wt 159.6 lb

## 2022-08-02 DIAGNOSIS — D473 Essential (hemorrhagic) thrombocythemia: Secondary | ICD-10-CM

## 2022-08-02 DIAGNOSIS — N3000 Acute cystitis without hematuria: Secondary | ICD-10-CM | POA: Insufficient documentation

## 2022-08-02 DIAGNOSIS — R300 Dysuria: Secondary | ICD-10-CM

## 2022-08-02 DIAGNOSIS — D72828 Other elevated white blood cell count: Secondary | ICD-10-CM

## 2022-08-02 LAB — CBC WITH DIFFERENTIAL (CANCER CENTER ONLY)
Abs Immature Granulocytes: 2.09 10*3/uL — ABNORMAL HIGH (ref 0.00–0.07)
Basophils Absolute: 0.5 10*3/uL — ABNORMAL HIGH (ref 0.0–0.1)
Basophils Relative: 2 %
Eosinophils Absolute: 0.2 10*3/uL (ref 0.0–0.5)
Eosinophils Relative: 1 %
HCT: 36.4 % (ref 36.0–46.0)
Hemoglobin: 11.3 g/dL — ABNORMAL LOW (ref 12.0–15.0)
Immature Granulocytes: 11 %
Lymphocytes Relative: 13 %
Lymphs Abs: 2.4 10*3/uL (ref 0.7–4.0)
MCH: 32 pg (ref 26.0–34.0)
MCHC: 31 g/dL (ref 30.0–36.0)
MCV: 103.1 fL — ABNORMAL HIGH (ref 80.0–100.0)
Monocytes Absolute: 2 10*3/uL — ABNORMAL HIGH (ref 0.1–1.0)
Monocytes Relative: 11 %
Neutro Abs: 11.4 10*3/uL — ABNORMAL HIGH (ref 1.7–7.7)
Neutrophils Relative %: 62 %
Platelet Count: 135 10*3/uL — ABNORMAL LOW (ref 150–400)
RBC: 3.53 MIL/uL — ABNORMAL LOW (ref 3.87–5.11)
RDW: 17.7 % — ABNORMAL HIGH (ref 11.5–15.5)
WBC Count: 18.6 10*3/uL — ABNORMAL HIGH (ref 4.0–10.5)
nRBC: 0.5 % — ABNORMAL HIGH (ref 0.0–0.2)

## 2022-08-02 LAB — URINALYSIS, COMPLETE (UACMP) WITH MICROSCOPIC
Bilirubin Urine: NEGATIVE
Glucose, UA: NEGATIVE mg/dL
Hgb urine dipstick: NEGATIVE
Ketones, ur: NEGATIVE mg/dL
Nitrite: NEGATIVE
Protein, ur: 30 mg/dL — AB
Specific Gravity, Urine: 1.015 (ref 1.005–1.030)
WBC, UA: >50 WBC/hpf — ABNORMAL HIGH (ref 0–5)
pH: 6 (ref 5.0–8.0)

## 2022-08-02 MED ORDER — SULFAMETHOXAZOLE-TRIMETHOPRIM 800-160 MG PO TABS: 1.0000 | ORAL_TABLET | Freq: Two times a day (BID) | ORAL | 0 refills | Status: AC

## 2022-08-02 NOTE — Telephone Encounter (Signed)
08/02/22 Next appt schduled and confirmed with patient

## 2022-08-03 LAB — URINE CULTURE

## 2022-08-08 ENCOUNTER — Telehealth: Payer: Self-pay

## 2022-08-08 ENCOUNTER — Other Ambulatory Visit: Payer: Self-pay | Admitting: Hematology and Oncology

## 2022-08-08 DIAGNOSIS — R300 Dysuria: Secondary | ICD-10-CM

## 2022-08-08 NOTE — Telephone Encounter (Signed)
-----   Message from Marvia Pickles, PA-C sent at 08/08/2022  1:00 PM EST ----- Please tell her the urine was contaminated and we recommend collecting a new sample. Can she come today for that? Please review clean catch/midstream collection. Thanks

## 2022-08-08 NOTE — Telephone Encounter (Signed)
Attempted to contact patient to let her know that her urine needs to be recollected due to contamination. No answer and no VM.

## 2022-08-13 ENCOUNTER — Telehealth: Payer: Self-pay

## 2022-08-13 NOTE — Telephone Encounter (Signed)
-----   Message from Marvia Pickles, PA-C sent at 08/13/2022  3:28 PM EST ----- Do you mind calling her? Levada Dy could not reach her the other day. Thanks ----- Message ----- From: Marvia Pickles, PA-C Sent: 08/08/2022   1:01 PM EST To: Belva Chimes, LPN  Please tell her the urine was contaminated and we recommend collecting a new sample. Can she come today for that? Please review clean catch/midstream collection. Thanks

## 2022-08-13 NOTE — Telephone Encounter (Signed)
Patient notified and voiced understanding will come tomorrow.

## 2022-08-14 ENCOUNTER — Other Ambulatory Visit: Payer: Self-pay

## 2022-08-14 ENCOUNTER — Inpatient Hospital Stay: Payer: PPO

## 2022-08-14 DIAGNOSIS — D473 Essential (hemorrhagic) thrombocythemia: Secondary | ICD-10-CM | POA: Diagnosis not present

## 2022-08-14 DIAGNOSIS — R300 Dysuria: Secondary | ICD-10-CM

## 2022-08-14 LAB — URINALYSIS, COMPLETE (UACMP) WITH MICROSCOPIC
Bacteria, UA: NONE SEEN
Bilirubin Urine: NEGATIVE
Glucose, UA: NEGATIVE mg/dL
Hgb urine dipstick: NEGATIVE
Ketones, ur: NEGATIVE mg/dL
Nitrite: NEGATIVE
Protein, ur: 30 mg/dL — AB
Specific Gravity, Urine: 1.019 (ref 1.005–1.030)
pH: 5 (ref 5.0–8.0)

## 2022-08-16 ENCOUNTER — Other Ambulatory Visit: Payer: Self-pay | Admitting: Hematology and Oncology

## 2022-08-16 DIAGNOSIS — R928 Other abnormal and inconclusive findings on diagnostic imaging of breast: Secondary | ICD-10-CM

## 2022-08-16 LAB — URINE CULTURE: Culture: 20000 — AB

## 2022-08-16 MED ORDER — NITROFURANTOIN MONOHYD MACRO 100 MG PO CAPS: 100.0000 mg | ORAL_CAPSULE | Freq: Two times a day (BID) | ORAL | 0 refills | Status: DC

## 2022-08-16 NOTE — Progress Notes (Signed)
Urine culture from earlier this month was contaminated.  Repeat this week reveals staph hemolyticus 20,000 colonies with tooth sensitivity.  Discussed with Dr. Hinton Rao and due to her immunocompromise, we will treat her with nitrofurantoin for 5 days.  She reports continued urinary symptoms.  She will contact us if her symptoms do not improve.  She is also overdue for bilateral diagnostic mammogram and is agreeable to have that scheduled.  This was ordered and message sent to scheduling.

## 2022-08-26 ENCOUNTER — Encounter: Payer: Self-pay | Admitting: Hematology and Oncology

## 2022-09-02 ENCOUNTER — Ambulatory Visit (INDEPENDENT_AMBULATORY_CARE_PROVIDER_SITE_OTHER): Payer: PPO

## 2022-09-02 DIAGNOSIS — I639 Cerebral infarction, unspecified: Secondary | ICD-10-CM | POA: Diagnosis not present

## 2022-09-04 LAB — CUP PACEART REMOTE DEVICE CHECK
Date Time Interrogation Session: 20240112231246
Implantable Pulse Generator Implant Date: 20210413

## 2022-09-05 NOTE — Progress Notes (Signed)
[facility] Summary Report / Loop Recorder

## 2022-09-10 DIAGNOSIS — M159 Polyosteoarthritis, unspecified: Secondary | ICD-10-CM | POA: Diagnosis not present

## 2022-09-10 DIAGNOSIS — E785 Hyperlipidemia, unspecified: Secondary | ICD-10-CM | POA: Diagnosis not present

## 2022-09-10 DIAGNOSIS — Z6828 Body mass index (BMI) 28.0-28.9, adult: Secondary | ICD-10-CM | POA: Diagnosis not present

## 2022-09-10 DIAGNOSIS — I10 Essential (primary) hypertension: Secondary | ICD-10-CM | POA: Diagnosis not present

## 2022-09-12 DIAGNOSIS — R921 Mammographic calcification found on diagnostic imaging of breast: Secondary | ICD-10-CM | POA: Diagnosis not present

## 2022-09-17 ENCOUNTER — Encounter: Payer: Self-pay | Admitting: Hematology and Oncology

## 2022-09-20 ENCOUNTER — Ambulatory Visit: Payer: PPO | Attending: Cardiology | Admitting: Cardiology

## 2022-09-20 ENCOUNTER — Encounter: Payer: Self-pay | Admitting: Cardiology

## 2022-09-20 VITALS — BP 134/70 | HR 65 | Ht 65.0 in | Wt 156.6 lb

## 2022-09-20 DIAGNOSIS — E782 Mixed hyperlipidemia: Secondary | ICD-10-CM

## 2022-09-20 DIAGNOSIS — I2510 Atherosclerotic heart disease of native coronary artery without angina pectoris: Secondary | ICD-10-CM

## 2022-09-20 DIAGNOSIS — I050 Rheumatic mitral stenosis: Secondary | ICD-10-CM

## 2022-09-20 DIAGNOSIS — I10 Essential (primary) hypertension: Secondary | ICD-10-CM | POA: Diagnosis not present

## 2022-09-20 DIAGNOSIS — Z951 Presence of aortocoronary bypass graft: Secondary | ICD-10-CM | POA: Diagnosis not present

## 2022-09-20 HISTORY — DX: Rheumatic mitral stenosis: I05.0

## 2022-09-20 NOTE — Patient Instructions (Signed)

## 2022-09-20 NOTE — Progress Notes (Signed)
Cardiology Office Note:    Date:  09/20/2022   ID:  Yolanda Flowers, DOB [birth year], MRN [id number]  PCP:  [name], MD  Cardiologist:  Jenean Lindau, MD   Referring MD: [name], MD    ASSESSMENT:    1. Coronary artery disease involving native coronary artery of native heart without angina pectoris   2. Essential (primary) hypertension   3. Mixed hyperlipidemia   4. S/P CABG x 4   5. Mild mitral stenosis    PLAN:    In order of problems listed above:  Coronary artery disease: Secondary prevention stressed with the patient.  Importance of compliance with diet medication stressed and she vocalized understanding.  She was advised to stay active to the best of her ability. Essential hypertension: Blood pressure is stable and diet was emphasized.  Lifestyle modification urged. Mixed dyslipidemia: On lipid-lowering medications followed by primary care doctor.  Last blood work reviewed available was reviewed and discussed with her. History of stroke: Stable at this time.  Fall precautions advised. Patient will be seen in follow-up appointment in 6 months or earlier if the patient has any concerns.  Our office manager Eliezer Bottom chaperoned the entire visit.    Medication Adjustments/Labs and Tests Ordered: Current medicines are reviewed at length with the patient today.  Concerns regarding medicines are outlined above.  No orders of the defined types were placed in this encounter.  No orders of the defined types were placed in this encounter.    No chief complaint on file.    History of Present Illness:    Yolanda Flowers is a 82 y.o. female.  Patient has past medical history of coronary artery disease post CABG surgery, essential hypertension, dyslipidemia and history of stroke.  She denies any problems at this time and takes care of activities of daily living.  Her gait is not really stable and she ambulates with a walker and a cane at times.  At the time of my  evaluation, the patient is alert awake oriented and in no distress.  Past Medical History:  Diagnosis Date   Abdominal pain    Accelerating angina ([place]) 08/26/2019   Acute blood loss anemia 12/14/2019   Acute cardioembolic stroke ([place]) 00/71/2197   Allergic rhinitis 06/21/2014   Anxiety    Arthritis    Asthma    Atherosclerosis of arteries 08/24/2019   Bilateral edema of lower extremity    Body mass index (BMI) 25.0-25.9, adult 08/24/2019   CAD (coronary artery disease) 10/25/2019   Cellulitis of right leg    Chronic low back pain 01/27/2018   Combined hyperlipidemia 58/83/2549   Complication of anesthesia    difficulty waking   Congestive heart failure (CHF) ([place])    COPD (chronic obstructive pulmonary disease) ([place])    Coronary artery disease    Coronary artery disease involving native heart without angina pectoris    Dyspnea 02/09/2019   Dyspnea on exertion    Dysuria 82/64/1583   Embolic stroke ([place]) 09/40/7680   Essential (primary) hypertension 02/20/2021   Essential thrombocythemia ([place]) 06/22/2014   Essential thrombocytosis ([place]) 06/22/2014   Gastrointestinal bleeding 06/23/2018   GERD without esophagitis 06/21/2014   History of melanoma excision    left greast toe 2015   History of squamous cell carcinoma excision    face--  multiple excisions   History of subdural hematoma    2008   HTN (hypertension) 06/21/2014   Hypertension    Ischial bursitis, right 08/24/2019  Leucocytosis 12/14/2019   Leukocytosis 12/14/2019   Malignant melanoma of great toe ([place]) 08/24/2019   Melanoma in situ ([place]) 06/29/2014   Mixed hyperlipidemia 08/24/2019   Opioid dependence ([place]) 08/24/2019   OSA (obstructive sleep apnea)    intolerant   Pedal edema 08/24/2019   Pneumonia of both lungs due to infectious organism 12/20/2020   Renal insufficiency 08/24/2019   S/P CABG x 4 11/04/2019   Scoliosis (and kyphoscoliosis), idiopathic 01/27/2018   Skin cancer 08/24/2019   Solitary  pulmonary nodule on lung CT 02/09/2019   Squamous cell carcinoma, scalp/neck 08/24/2019   Stroke ([place]) 12/21/2020   Stroke due to embolism ([place]) 11/25/2019   Subdural hematoma ([place]) 12/20/2020   Swelling of limb 08/24/2019   Traumatic subdural hemorrhage with loss of consciousness of unspecified duration, initial encounter ([place]) 02/20/2021   VIN III (vulvar intraepithelial neoplasia III)    Vitamin B 12 deficiency 08/24/2019   Vitamin D deficiency 08/24/2019   Wears dentures    UPPER AND LOWER PARTIAL   Wears glasses     Past Surgical History:  Procedure Laterality Date   ABDOMINAL HYSTERECTOMY  age 29   CARDIAC CATHETERIZATION  03/172021   CORONARY ARTERY BYPASS GRAFT N/A 11/04/2019   Procedure: CORONARY ARTERY BYPASS GRAFTING (CABG) x 4, with ENDOSCOPIC HARVESTING OF RIGHT GREATER SAPHENOUS VEIN.;  Surgeon: Gaye Pollack, MD;  Location: [facility];  Service: Open Heart Surgery;  Laterality: N/A;   INTRAVASCULAR ULTRASOUND/IVUS N/A 11/03/2019   Procedure: Intravascular Ultrasound/IVUS;  Surgeon: Nelva Bush, MD;  Location: [place] [facility];  Service: Cardiovascular;  Laterality: N/A;   KNEE ARTHROSCOPY Left 2004   LEFT HEART CATH AND CORONARY ANGIOGRAPHY N/A 11/03/2019   Procedure: LEFT HEART CATH AND CORONARY ANGIOGRAPHY;  Surgeon: Nelva Bush, MD;  Location: [place];  Service: Cardiovascular;  Laterality: N/A;   LOOP RECORDER INSERTION N/A 11/30/2019   Procedure: LOOP RECORDER INSERTION;  Surgeon: Thompson Grayer, MD;  Location: [facility];  Service: Cardiovascular;  Laterality: N/A;   MELANOMA EXCISION  2015   left great toe   REPAIR PERONEAL TENDONS ANKLE  2004   SUBDURAL HEMATOMA EVACUATION VIA CRANIOTOMY  2008      week later  post-op  [facility]   TEE WITHOUT CARDIOVERSION N/A 11/04/2019   Procedure: TRANSESOPHAGEAL ECHOCARDIOGRAM (TEE);  Surgeon: Gaye Pollack, MD;  Location: [facility];  Service: Open Heart Surgery;  Laterality: N/A;   VULVECTOMY  N/A 10/24/2015   Procedure: WIDE LOCAL EXCISION OF THE VULVA ;  Surgeon: Everitt Amber, MD;  Location: [facility];  Service: Gynecology;  Laterality: N/A;    Current Medications: Current Meds  Medication Sig   ammonium lactate (LAC-HYDRIN) 12 % lotion Apply 1 Application topically as needed for dry skin.   aspirin EC 81 MG tablet Take 1 tablet (81 mg total) by mouth daily. Swallow whole.   atorvastatin (LIPITOR) 20 MG tablet TAKE 1 TABLET BY MOUTH EVERY DAY   b complex vitamins capsule Take 1 capsule by mouth daily.   fluticasone (FLONASE) 50 MCG/ACT nasal spray Place 2 sprays into both nostrils at bedtime.    magnesium oxide (MAG-OX) 400 MG tablet Take 400 mg by mouth daily.   meclizine (ANTIVERT) 12.5 MG tablet Take 12.5 mg by mouth 2 (two) times daily as needed for dizziness.   metoprolol tartrate (LOPRESSOR) 25 MG tablet Take 1 tablet (25 mg total) by mouth 2 (two) times daily.   Multiple Vitamins-Minerals (PRESERVISION AREDS 2)  CAPS Take 1 capsule by mouth in the morning and at bedtime.    omeprazole (PRILOSEC) 20 MG capsule Take 20 mg by mouth daily.   oxybutynin (DITROPAN-XL) 10 MG 24 hr tablet Take 10 mg by mouth daily.   Vitamin D, Ergocalciferol, (DRISDOL) 1.25 MG (50000 UT) CAPS capsule Take 50,000 Units by mouth every 14 (fourteen) days.   Wheat Dextrin (BENEFIBER PO) Take 1 tablet by mouth daily.     Allergies:   Cephalexin, Penicillin g, Ciprofloxacin, and Penicillins   Social History   Socioeconomic History   Marital status: Married    Spouse name: Not on file   Number of children: Not on file   Years of education: Not on file   Highest education level: Not on file  Occupational History   Not on file  Tobacco Use   Smoking status: Never   Smokeless tobacco: Never  Vaping Use   Vaping Use: Never used  Substance and Sexual Activity   Alcohol use: No   Drug use: No   Sexual activity: Not Currently  Other Topics Concern   Not on file  Social History  Narrative   Not on file   Social Determinants of Health   Financial Resource Strain: Not on file  Food Insecurity: No Food Insecurity (04/29/2022)   Hunger Vital Sign    Worried About Running Out of Food in the Last Year: Never true    Ran Out of Food in the Last Year: Never true  Transportation Needs: No Transportation Needs (04/29/2022)   PRAPARE - Hydrologist (Medical): No    Lack of Transportation (Non-Medical): No  Physical Activity: Not on file  Stress: Not on file  Social Connections: Not on file     Family History: The patient's family history includes Arthritis in her brother, father, mother, and sister; Asthma in her father; Clotting disorder in her mother; Emphysema in her father; Heart disease in her brother, father, mother, and sister; Hypertension in her brother, father, mother, and sister; Kidney failure in her father; Stroke in her father.  ROS:   Please see the history of present illness.    All other systems reviewed and are negative.  EKGs/Labs/Other Studies Reviewed:    The following studies were reviewed today: EKG sinus rhythm left ventricular hypertrophy and nonspecific ST-T changes   Recent Labs: 10/17/2021: TSH 1.796 05/03/2022: ALT 26; BUN 18; Creatinine 1.0; Potassium 4.5; Sodium 142 08/02/2022: Hemoglobin 11.3; Platelet Count 135  Recent Lipid Panel    Component Value Date/Time   CHOL 95 03/30/2021 0000   CHOL 105 09/26/2020 0908   TRIG 160 03/30/2021 0000   HDL 32 (A) 03/30/2021 0000   HDL 33 (L) 09/26/2020 0908   CHOLHDL 2.6 12/21/2020 0236   VLDL 21 12/21/2020 0236   LDLCALC 36 03/30/2021 0000   LDLCALC 41 09/26/2020 0908    Physical Exam:    VS:  BP 134/70   Pulse 65   Ht '5\' 5"'$  (1.651 m)   Wt 156 lb 9.6 oz (71 kg)   SpO2 97%   BMI 26.06 kg/m     Wt Readings from Last 3 Encounters:  09/20/22 156 lb 9.6 oz (71 kg)  08/02/22 159 lb 9.6 oz (72.4 kg)  05/20/22 162 lb 3.2 oz (73.6 kg)     GEN: Patient  is in no acute distress HEENT: Normal NECK: No JVD; No carotid bruits LYMPHATICS: No lymphadenopathy CARDIAC: Hear sounds regular, 2/6 systolic murmur at the  apex. RESPIRATORY:  Clear to auscultation without rales, wheezing or rhonchi  ABDOMEN: Soft, non-tender, non-distended MUSCULOSKELETAL:  No edema; No deformity  SKIN: Warm and dry NEUROLOGIC:  Alert and oriented x 3 PSYCHIATRIC:  Normal affect   Signed, Jenean Lindau, MD  09/20/2022 2:17 PM    [facility]

## 2022-09-27 ENCOUNTER — Other Ambulatory Visit: Payer: Self-pay | Admitting: Cardiology

## 2022-09-28 ENCOUNTER — Other Ambulatory Visit: Payer: Self-pay | Admitting: Cardiology

## 2022-09-30 DIAGNOSIS — N390 Urinary tract infection, site not specified: Secondary | ICD-10-CM | POA: Diagnosis not present

## 2022-09-30 DIAGNOSIS — N3281 Overactive bladder: Secondary | ICD-10-CM | POA: Diagnosis not present

## 2022-09-30 DIAGNOSIS — Z6825 Body mass index (BMI) 25.0-25.9, adult: Secondary | ICD-10-CM | POA: Diagnosis not present

## 2022-10-06 LAB — CUP PACEART REMOTE DEVICE CHECK
Date Time Interrogation Session: 20240214231447
Implantable Pulse Generator Implant Date: 20210413

## 2022-10-07 ENCOUNTER — Ambulatory Visit (INDEPENDENT_AMBULATORY_CARE_PROVIDER_SITE_OTHER): Payer: PPO

## 2022-10-07 DIAGNOSIS — I639 Cerebral infarction, unspecified: Secondary | ICD-10-CM | POA: Diagnosis not present

## 2022-10-18 NOTE — Progress Notes (Signed)
Carelink Summary Report / Loop Recorder 

## 2022-11-01 ENCOUNTER — Ambulatory Visit: Payer: PPO | Admitting: Hematology and Oncology

## 2022-11-01 ENCOUNTER — Other Ambulatory Visit: Payer: PPO

## 2022-11-09 LAB — CUP PACEART REMOTE DEVICE CHECK
Date Time Interrogation Session: 20240318231304
Implantable Pulse Generator Implant Date: 20210413

## 2022-11-11 ENCOUNTER — Ambulatory Visit (INDEPENDENT_AMBULATORY_CARE_PROVIDER_SITE_OTHER): Payer: PPO

## 2022-11-11 DIAGNOSIS — I639 Cerebral infarction, unspecified: Secondary | ICD-10-CM | POA: Diagnosis not present

## 2022-11-12 ENCOUNTER — Other Ambulatory Visit: Payer: PPO

## 2022-11-12 ENCOUNTER — Ambulatory Visit: Payer: PPO | Admitting: Hematology and Oncology

## 2022-11-18 DIAGNOSIS — E782 Mixed hyperlipidemia: Secondary | ICD-10-CM | POA: Diagnosis not present

## 2022-11-18 DIAGNOSIS — I10 Essential (primary) hypertension: Secondary | ICD-10-CM | POA: Diagnosis not present

## 2022-11-20 NOTE — Progress Notes (Signed)
Carelink Summary Report / Loop Recorder 

## 2022-11-21 ENCOUNTER — Ambulatory Visit: Payer: PPO | Admitting: Neurology

## 2022-11-27 NOTE — Progress Notes (Signed)
[facility]  [address] [place],  [place]  [address] [phone]  Clinic Day:  11/28/22  Referring physician: Lucianne Lei, MD  ASSESSMENT & PLAN:  Assessment & Plan: Essential thrombocythemia [facility]) Essential thrombocythemia previously treated with hydroxyurea.  Due to low normal platelets, hydroxyurea was discontinued in October 2021.  The platelets  remain stable.  She has leukocytosis/neutrophilia, which she has had on and off since 2018.  This is felt to be due to her myeloproliferative disease.    Anemia This is worse, her hemoglobin last one was 11.8, but recent vitamin levels looked good.   History of vulvar cancer Treated with surgical excision. When I asked whether she had followed up with the gyn-oncologist she told me she had forgotten about this.    Right upper lobe nodule  Pulmonology is following, but CT imaging in July 2021 revealed stable pulmonary nodules.   History of melanoma of the great toe Treated with surgical excision.  She remains without evidence of recurrence   Status post CABG x4 in March 2021. Status post CVA x2 in March and April 2021.  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   Calcifications in the right breast in March 2021  These initially were noted in March 2021 and characterized these as likely benign. Her last mammogram in January was clear and the benign appearing calcifications are unchanged. We will continue to monitor periodically.  CVA May 2022.   She has recovered but still has some extremity weakness of the right hand.   Acute blood loss anemia, January 2023.  The patient was admitted with an esophageal tear.  Her hemoglobin dropped to 6.9 during admission, and so she received 1 unit of packed red blood cells with improvement in her hemoglobin to 8.2. She was up to 10.2 when she was seen on February 1st and continued to improve.     Plan Patient states that she believes she has a UTI. This has been a constant recurring problem and I think she has bladder prolapse as well, she has not followed up with a GYN-Oncologist for years for her prior vulvar cancer. I will refer her to Urogynecologist, Dr. Hope Pigeon, and she may refer to GYN/ONC if she feels it is necessary. Her labs today are pending. We will have a urinalysis and urine culture done today. I will see her back in 3 months with CBC and CMP. The patient understands the plans discussed today and is in agreement with them.  She knows to contact our office if she develops concerns prior to her next appointment.  I provided 20 minutes of face-to-face time during this encounter and > 50% was spent counseling as documented under my assessment and plan.    ADDENDUM: Her WBC is stable at 17.2, hemoglobin a little lower at 11.0, and platelets are lower at 115,000. Her myeloproliferative syndrome may be evolving, we will continue to monitor.  ADDENDUM:  Her urine culture does show 80,000 col/ml of Klebsiella, resistant to ampicillin and intermediate to nitrofurantoin but sensitive to the rest of the antibiotics tested.  I will order Bactrim DS bid for 10 days.   [name] AT [facility] [address] [place] [address] Dept: [phone] Dept Fax: [phone]   No orders of the defined types were placed in this encounter.   CHIEF COMPLAINT:  CC: Essential thrombocythemia  Current Treatment:  Observation  HISTORY OF PRESENT ILLNESS:  Yolanda Flowers is a 82 year old female with a history of essential thrombocythemia originally diagnosed in 50.  She was treated with hydroxyurea 500 mg 3 times daily.  We have had to occasionally adjust doses because of leukopenia or anemia.  She also had a malignant melanoma of her left first toe resected in November 2015 with partial amputation of the toe and has done  well.  She has had multiple other skin cancers removed over the years.  In March 2017, she was seen by Dr. Andrey Farmer for HSIL/VIN 3 of the right posterior labia minora and underwent vulvectomy.  Hydroxyurea was decreased to 500 mg twice daily prior to surgery, as we did not feel we could safely hold hydroxyurea.  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  Bone density scan in March 2018 revealed osteopenia with a T-score of -1.6 in the femur, for which she is on calcium and vitamin-D.  She has had chronic back pain, which is attributed to severe degenerative disease.  She had an MRI of the lumbar spine in March 2018, which revealed significant degenerative disc disease with mild spinal stenosis at L1 and L4, but moderate at L2-3.  Dr. Ollen Bowl has given her injections, as well at hydrocodone/APAP 5/325.  She has had intermittent leukocytosis since 2018, felt to be secondary to her essential thrombocythemia. She was evaluated in the emergency department in October 2019 and had a GI bleed. CT abdomen and pelvis revealed acute uncomplicated diverticulitis of the sigmoid colon with no other acute abnormalities.  A moderate to large hiatal hernia was seen. She saw Dr. Tomie China and underwent nuclear med stress test in November, which did not reveal any evidence of inducible ischemia or other abnormality.  Left ventricular size and function was normal with an ejection fraction of 79%.  She underwent EGD and colonoscopy in December  2019, and no significant source of blood loss was identified. She was instructed to continue lansoprazole.  She had dilatation of an esophageal stricture and removal of a polyp. Pathology revealed a fragmented tubular adenoma.    In January 2020, she had worsening anemia, in addition to continued episodes of chest pain and worsening dyspnea with exertion at her routine follow-up. Due to the severe dyspnea  and right lower extremity edema, we obtained a CT angiogram chest, which did not reveal any evidence of pulmonary embolism.  There was good opacification of the pulmonary arteries.  Diffuse coronary artery calcifications with cardiomegaly and moderate thoracic aortic atherosclerosis was seen.  There was a 3.6 mm noncalcified nodule in the anterior inferior right upper lobe.  Non-contrasted CT chest in 6-12 months was recommended.  Right lower extremity venous Doppler ultrasound did not reveal any evidence of deep venous thrombosis. In February 2020, she had persistent anemia with a hemoglobin of 9.7. As she was symptomatic, hydroxyurea 500 mg was decreased to once daily.  Soluble transferrin was elevated, which was consistent with iron deficiency.  She saw Dr. Jennye Boroughs and he placed her on iron supplementation daily in the form of ferrous sulfate 65 mg. She was seen again in March and had improvement in her anemia with a hemoglobin of 10.2.  Her thrombocythemia remained controlled on hydroxyurea 500 mg daily. We encouraged her to have follow-up for her vulvar cancer, so followed up with Dr. Alison Murray and her exam was normal.     She had a bilateral screening  mammogram in February 2021, which revealed an area of calcifications in the right breast warranting further evaluation.  A diagnostic right mammogram was ordered, but this was delayed, because she underwent open heart surgery with CABG x4 in March.  She had a monitor placed in the left chest wall the time of surgery.  She did not feel she can have mammogram due to the monitor in the left chest wall.  She then had 2 strokes following her surgery, from which she has recovered well, except for balance issues and left eye blindness.  She ambulates with a cane or walker depending on her circumstances.  She is on clopidogrel 75 mg daily, atorvastatin 80 mg daily, metoprolol 25 mg twice daily and pantoprazole 40 mg daily. She had a CT chest in July to  follow-up on the pulmonary nodule which were stable. She finally underwent diagnostic right mammogram from July which revealed likely benign 6 mm group of calcifications involving the upper inner quadrant at middle depth. Bilateral diagnostic mammogram in February 2022 was recommended  At her visit in October 2021, she reported elevated transaminases and purpura, which was attributed to atorvastatin.  Dr. Tomie China reduced the dose from 80 mg to 40 mg with normalization of the transaminases and no further purpura.  Her platelets were in low normal range, so hydroxyurea was held and since her platelets have remained normal, hydroxyurea was discontinued. She has had intermittent mild leukocytosis/neutrophilia since 2018.  She has occasionally had immature cells on her blood smear, but not blasts.    When I saw her in May of 2022, she had worsening leukocytosis with persistent immature cells and worsening anemia, so I recommended a bone marrow biopsy, but this was never done as the patient was hospitalized with a stroke and discharged to rehab. She was seen again in August, at which time her white count had decreased from 24.2 to 19.9 with an ANC of 1473, with 11% lymphocytes, 10% monocytes and 2.4 % basophils and her hemoglobin had improved from 10.6 to 11.3. The platelet count remained normal, so continued observation was recommended.  Her blood counts were stable in November.   She was admitted in January of 2023 with a 3 day history of sensation of an object stuck in her throat following attempted swallowing of calcium supplementation. She was eventually able to dislodge item after several hours.  She then developed stool that she described as "motor oil" for 2 days. She was evaluated in the emergency room and was found to have hemoglobin of 8.3 decreased from 11.4 in November 2022.   She underwent urgent EGD and was found to have a 3 cm mucosal tear within the distal 5 cm of her esophagus, felt to be most  likely secondary to mucosal tear following calcium carbonate impaction. CT neck and chest was performed to evaluate for possibility of mediastinitis developing following possible full focus mucosal tear. This imaging did not reveal active inflammatory changes. 3 clips were placed closing the tear successfully and complete hemostasis was achieved.  She was placed on twice daily pantoprazole with slow advancement of diet from clear liquid to house select over 2 days.  She had an acute drop in hemoglobin to 6.9 following further equilibration, so received 1 unit PRBC with appropriate response to 8.2 g/dL. Her hemoglobin remained at approximately 8 for the remainder of admission.  She was instructed to take iron supplementation while at home and to avoid calcium carbonate for time being and to transition to over-the-counter  chewable supplementation.  Iron/TIBC did not reveal iron deficiency. Review her blood smear in March revealed occasional myelocytes, occasional nucleated RBC's, poikilocytosis, and 1 promyelocyte.   INTERVAL HISTORY:  Yolanda Flowers is here today for repeat clinical assessment for essential thrombocythemia. Patient states that she believes she has a UTI as she is experiencing severe dysuria with no blood but it is cloudy. This has been a constant recurring problem and I think she has bladder prolapse as well, she has not followed up with a GYN-Oncologist for years for her prior vulvar cancer. I will refer her to Urogynecologist Dr. Hope Pigeon. She will provide a urine sample for [place] before she leaves. Her labs today are pending. We will have a urinalysis and urine culture done. I will see her back in 3 months with CBC and CMP.  She denies signs of infection such as sore throat, sinus drainage, cough, or urinary symptoms.  She denies fevers or recurrent chills. She denies pain. She denies nausea, vomiting, chest pain, dyspnea or cough. Her weight has been stable.  REVIEW OF SYSTEMS:  Review of Systems   Constitutional: Negative.  Negative for appetite change, chills, diaphoresis, fatigue, fever and unexpected weight change.  HENT:  Negative.  Negative for hearing loss, lump/mass, mouth sores, nosebleeds, sore throat, tinnitus, trouble swallowing and voice change.   Eyes: Negative.  Negative for eye problems and icterus.  Respiratory: Negative.  Negative for chest tightness, cough, hemoptysis, shortness of breath and wheezing.   Cardiovascular: Negative.  Negative for chest pain, leg swelling and palpitations.  Gastrointestinal: Negative.  Negative for abdominal distention, abdominal pain, blood in stool, constipation, diarrhea, nausea, rectal pain and vomiting.  Endocrine: Negative.  Negative for hot flashes.  Genitourinary:  Positive for difficulty urinating (frequent and toublesome.) and dysuria. Negative for bladder incontinence, dyspareunia, frequency, hematuria, menstrual problem, nocturia, pelvic pain, vaginal bleeding and vaginal discharge.   Musculoskeletal:  Positive for gait problem (she uses a cane). Negative for arthralgias, back pain, flank pain, myalgias, neck pain and neck stiffness.  Skin: Negative.  Negative for itching, rash and wound.  Neurological:  Positive for gait problem (she uses a cane). Negative for dizziness, extremity weakness, headaches, light-headedness, numbness, seizures and speech difficulty.  Hematological: Negative.  Negative for adenopathy. Does not bruise/bleed easily.  Psychiatric/Behavioral: Negative.  Negative for confusion, decreased concentration, depression, sleep disturbance and suicidal ideas. The patient is not nervous/anxious.      VITALS:  There were no vitals taken for this visit.  Wt Readings from Last 3 Encounters:  09/20/22 156 lb 9.6 oz (71 kg)  08/02/22 159 lb 9.6 oz (72.4 kg)  05/20/22 162 lb 3.2 oz (73.6 kg)    There is no height or weight on file to calculate BMI.  Performance status (ECOG): 1  PHYSICAL EXAM:  Physical  Exam Vitals and nursing note reviewed.  Constitutional:      General: She is not in acute distress.    Appearance: Normal appearance. She is normal weight. She is not ill-appearing, toxic-appearing or diaphoretic.  HENT:     Head: Normocephalic and atraumatic.     Right Ear: Tympanic membrane, ear canal and external ear normal. There is no impacted cerumen.     Left Ear: Tympanic membrane, ear canal and external ear normal. There is no impacted cerumen.     Nose: Nose normal. No congestion or rhinorrhea.     Mouth/Throat:     Mouth: Mucous membranes are moist.     Pharynx: Oropharynx is clear. No  oropharyngeal exudate or posterior oropharyngeal erythema.  Eyes:     General: No scleral icterus.       Right eye: No discharge.        Left eye: No discharge.     Extraocular Movements: Extraocular movements intact.     Conjunctiva/sclera: Conjunctivae normal.     Pupils: Pupils are equal, round, and reactive to light.  Neck:     Vascular: No carotid bruit.  Cardiovascular:     Rate and Rhythm: Normal rate and regular rhythm.     Pulses: Normal pulses.     Heart sounds: Normal heart sounds. No murmur heard.    No friction rub. No gallop.  Pulmonary:     Effort: Pulmonary effort is normal. No respiratory distress.     Breath sounds: Normal breath sounds. No stridor. No wheezing, rhonchi or rales.  Chest:     Chest wall: No tenderness.     Comments: Large sternal scar which is well healed  Inversion of the nipples No masses in either breast. Abdominal:     General: Bowel sounds are normal. There is no distension.     Palpations: Abdomen is soft. There is no hepatomegaly, splenomegaly or mass.     Tenderness: There is no abdominal tenderness. There is no right CVA tenderness, left CVA tenderness, guarding or rebound.     Hernia: No hernia is present.     Comments:    Musculoskeletal:        General: No swelling, tenderness, deformity or signs of injury. Normal range of motion.      Cervical back: Normal range of motion and neck supple. No rigidity or tenderness.     Right lower leg: Edema (mild) present.     Left lower leg: Edema (mild) present.     Comments: R>L  Lymphadenopathy:     Cervical: No cervical adenopathy.     Right cervical: No superficial, deep or posterior cervical adenopathy.    Left cervical: No superficial, deep or posterior cervical adenopathy.     Upper Body:     Right upper body: No supraclavicular, axillary or pectoral adenopathy.     Left upper body: No supraclavicular, axillary or pectoral adenopathy.     Lower Body: No right inguinal adenopathy. No left inguinal adenopathy.  Skin:    General: Skin is warm and dry.     Coloration: Skin is not jaundiced or pale.     Findings: No bruising, erythema, lesion or rash.  Neurological:     General: No focal deficit present.     Mental Status: She is alert and oriented to person, place, and time. Mental status is at baseline.     Cranial Nerves: No cranial nerve deficit.     Sensory: No sensory deficit.     Motor: No weakness.     Coordination: Coordination normal.     Gait: Gait normal.     Deep Tendon Reflexes: Reflexes normal.  Psychiatric:        Mood and Affect: Mood normal.        Behavior: Behavior normal.        Thought Content: Thought content normal.        Judgment: Judgment normal.    LABS:      Latest Ref Rng & Units 08/02/2022    1:36 PM 05/03/2022   12:00 AM 02/15/2022   12:00 AM  CBC  WBC 4.0 - 10.5 K/uL 18.6  19.9     18.4  Hemoglobin 12.0 - 15.0 g/dL 86.5  78.4     69.6      Hematocrit 36.0 - 46.0 % 36.4  36     35      Platelets 150 - 400 K/uL 135  129     124         This result is from an external source.       Latest Ref Rng & Units 05/03/2022   12:00 AM 10/17/2021   12:00 AM 09/19/2021   12:00 AM  CMP  BUN 4 - 21 18     17  19       Creatinine 0.5 - 1.1 1.0     1.0  1.2      Sodium 137 - 147 142     143  143      Potassium 3.5 - 5.1 mEq/L 4.5     4.5   4.1      Chloride 99 - 108 109     108  107      CO2 13 - 22 23     27  25       Calcium 8.7 - 10.7 8.8     8.7  8.8      Alkaline Phos 25 - 125 76     80  76      AST 13 - 35 36     30  31      ALT 7 - 35 U/L 26     22  21          This result is from an external source.    No results found for: "CEA1", "CEA" / No results found for: "CEA1", "CEA" No results found for: "PSA1" No results found for: "EXB284" No results found for: "CAN125"  No results found for: "TOTALPROTELP", "ALBUMINELP", "A1GS", "A2GS", "BETS", "BETA2SER", "GAMS", "MSPIKE", "SPEI" Lab Results  Component Value Date   TIBC 484 (H) 10/17/2021   TIBC 358 07/10/2021   TIBC 305 12/18/2020   FERRITIN 16 10/17/2021   FERRITIN 29 09/19/2021   FERRITIN 59 07/10/2021   IRONPCTSAT 18 10/17/2021   IRONPCTSAT 26 07/10/2021   IRONPCTSAT 15.4 12/18/2020   No results found for: "LDH"  STUDIES:  CUP PACEART REMOTE DEVICE CHECK  Result Date: 11/09/2022 ILR summary report received. Battery status OK. Normal device function. No new symptom, tachy, brady, or pause episodes. No new AF episodes.  AF burden is 0% of the time.   Monthly summary reports and ROV/PRN Hassell Halim, RN, CCDS, [employer]     HISTORY:   Past Medical History:  Diagnosis Date   Abdominal pain    Accelerating angina (HCC) 08/26/2019   Acute blood loss anemia 12/14/2019   Acute cardioembolic stroke (HCC) 11/30/2019   Allergic rhinitis 06/21/2014   Anxiety    Arthritis    Asthma    Atherosclerosis of arteries 08/24/2019   Bilateral edema of lower extremity    Body mass index (BMI) 25.0-25.9, adult 08/24/2019   CAD (coronary artery disease) 10/25/2019   Cellulitis of right leg    Chronic low back pain 01/27/2018   Combined hyperlipidemia 08/24/2019   Complication of anesthesia    difficulty waking   Congestive heart failure (CHF) (HCC)    COPD (chronic obstructive pulmonary disease) (HCC)    Coronary artery disease    Coronary artery  disease involving native heart without angina pectoris    Dyspnea 02/09/2019   Dyspnea on exertion  Dysuria 09/19/2021   Embolic stroke (HCC) 11/30/2019   Essential (primary) hypertension 02/20/2021   Essential thrombocythemia (HCC) 06/22/2014   Essential thrombocytosis (HCC) 06/22/2014   Gastrointestinal bleeding 06/23/2018   GERD without esophagitis 06/21/2014   History of melanoma excision    left greast toe 2015   History of squamous cell carcinoma excision    face--  multiple excisions   History of subdural hematoma    2008   HTN (hypertension) 06/21/2014   Hypertension    Ischial bursitis, right 08/24/2019   Leucocytosis 12/14/2019   Leukocytosis 12/14/2019   Malignant melanoma of great toe (HCC) 08/24/2019   Melanoma in situ (HCC) 06/29/2014   Mixed hyperlipidemia 08/24/2019   Opioid dependence (HCC) 08/24/2019   OSA (obstructive sleep apnea)    intolerant   Pedal edema 08/24/2019   Pneumonia of both lungs due to infectious organism 12/20/2020   Renal insufficiency 08/24/2019   S/P CABG x 4 11/04/2019   Scoliosis (and kyphoscoliosis), idiopathic 01/27/2018   Skin cancer 08/24/2019   Solitary pulmonary nodule on lung CT 02/09/2019   Squamous cell carcinoma, scalp/neck 08/24/2019   Stroke (HCC) 12/21/2020   Stroke due to embolism (HCC) 11/25/2019   Subdural hematoma (HCC) 12/20/2020   Swelling of limb 08/24/2019   Traumatic subdural hemorrhage with loss of consciousness of unspecified duration, initial encounter (HCC) 02/20/2021   VIN III (vulvar intraepithelial neoplasia III)    Vitamin B 12 deficiency 08/24/2019   Vitamin D deficiency 08/24/2019   Wears dentures    UPPER AND LOWER PARTIAL   Wears glasses     Past Surgical History:  Procedure Laterality Date   ABDOMINAL HYSTERECTOMY  age 68   CARDIAC CATHETERIZATION  03/172021   CORONARY ARTERY BYPASS GRAFT N/A 11/04/2019   Procedure: CORONARY ARTERY BYPASS GRAFTING (CABG) x 4, with ENDOSCOPIC HARVESTING OF  RIGHT GREATER SAPHENOUS VEIN.;  Surgeon: Alleen Borne, MD;  Location: [facility] OR;  Service: Open Heart Surgery;  Laterality: N/A;   CORONARY ULTRASOUND/IVUS N/A 11/03/2019   Procedure: Intravascular Ultrasound/IVUS;  Surgeon: Yvonne Kendall, MD;  Location: [facility] INVASIVE CV LAB;  Service: Cardiovascular;  Laterality: N/A;   KNEE ARTHROSCOPY Left 2004   LEFT HEART CATH AND CORONARY ANGIOGRAPHY N/A 11/03/2019   Procedure: LEFT HEART CATH AND CORONARY ANGIOGRAPHY;  Surgeon: Yvonne Kendall, MD;  Location: [facility] INVASIVE CV LAB;  Service: Cardiovascular;  Laterality: N/A;   LOOP RECORDER INSERTION N/A 11/30/2019   Procedure: LOOP RECORDER INSERTION;  Surgeon: Hillis Range, MD;  Location: [facility] INVASIVE CV LAB;  Service: Cardiovascular;  Laterality: N/A;   MELANOMA EXCISION  2015   left great toe   REPAIR PERONEAL TENDONS ANKLE  2004   SUBDURAL HEMATOMA EVACUATION VIA CRANIOTOMY  2008      week later  post-op  [facility]   TEE WITHOUT CARDIOVERSION N/A 11/04/2019   Procedure: TRANSESOPHAGEAL ECHOCARDIOGRAM (TEE);  Surgeon: Alleen Borne, MD;  Location: [facility] OR;  Service: Open Heart Surgery;  Laterality: N/A;   VULVECTOMY N/A 10/24/2015   Procedure: WIDE LOCAL EXCISION OF THE VULVA ;  Surgeon: Adolphus Birchwood, MD;  Location: [facility];  Service: Gynecology;  Laterality: N/A;    Family History  Problem Relation Age of Onset   Clotting disorder Mother    Hypertension Mother    Heart disease Mother    Arthritis Mother    Stroke Father    Hypertension Father    Heart disease Father    Arthritis Father    Emphysema Father  Asthma Father    Kidney failure Father    Hypertension Sister    Heart disease Sister    Arthritis Sister    Hypertension Brother    Heart disease Brother    Arthritis Brother     Social History:  reports that she has never smoked. She has never used smokeless tobacco. She reports that she does not drink alcohol and does not use drugs.The patient is alone  today.  Allergies:  Allergies  Allergen Reactions   Cephalexin Hives   Penicillin G    Ciprofloxacin Other (See Comments)    Gi intolerance   Penicillins Rash    Childhood reaction Did it involve swelling of the face/tongue/throat, SOB, or low BP? No Did it involve sudden or severe rash/hives, skin peeling, or any reaction on the inside of your mouth or nose? No Did you need to seek medical attention at a hospital or doctor's office? No When did it last happen?      50 years ago If all above answers are "NO", may proceed with cephalosporin use.     Current Medications: Current Outpatient Medications  Medication Sig Dispense Refill   ammonium lactate (LAC-HYDRIN) 12 % lotion Apply 1 Application topically as needed for dry skin.     aspirin EC 81 MG tablet Take 1 tablet (81 mg total) by mouth daily. Swallow whole. 90 tablet 3   atorvastatin (LIPITOR) 20 MG tablet Take 1 tablet (20 mg total) by mouth daily. 90 tablet 3   b complex vitamins capsule Take 1 capsule by mouth daily.     fluticasone (FLONASE) 50 MCG/ACT nasal spray Place 2 sprays into both nostrils at bedtime.      magnesium oxide (MAG-OX) 400 MG tablet Take 400 mg by mouth daily.     meclizine (ANTIVERT) 12.5 MG tablet Take 12.5 mg by mouth 2 (two) times daily as needed for dizziness.     metoprolol tartrate (LOPRESSOR) 25 MG tablet TAKE 1 TABLET BY MOUTH TWICE A DAY 180 tablet 1   Multiple Vitamins-Minerals (PRESERVISION AREDS 2) CAPS Take 1 capsule by mouth in the morning and at bedtime.      omeprazole (PRILOSEC) 20 MG capsule Take 20 mg by mouth daily.     oxybutynin (DITROPAN-XL) 10 MG 24 hr tablet Take 10 mg by mouth daily.     Vitamin D, Ergocalciferol, (DRISDOL) 1.25 MG (50000 UT) CAPS capsule Take 50,000 Units by mouth every 14 (fourteen) days.     Wheat Dextrin (BENEFIBER PO) Take 1 tablet by mouth daily.     No current facility-administered medications for this visit.     I,Jasmine M Lassiter,acting as a  scribe for Dellia Beckwith, MD.,have documented all relevant documentation on the behalf of Dellia Beckwith, MD,as directed by  Dellia Beckwith, MD while in the presence of Dellia Beckwith, MD.

## 2022-11-28 ENCOUNTER — Other Ambulatory Visit: Payer: Self-pay | Admitting: Oncology

## 2022-11-28 ENCOUNTER — Inpatient Hospital Stay: Payer: PPO | Attending: Oncology

## 2022-11-28 ENCOUNTER — Other Ambulatory Visit: Payer: Self-pay

## 2022-11-28 ENCOUNTER — Inpatient Hospital Stay (INDEPENDENT_AMBULATORY_CARE_PROVIDER_SITE_OTHER): Payer: PPO | Admitting: Oncology

## 2022-11-28 VITALS — BP 155/105 | HR 59 | Temp 98.6°F | Resp 18 | Ht 65.0 in | Wt 156.4 lb

## 2022-11-28 DIAGNOSIS — Z79899 Other long term (current) drug therapy: Secondary | ICD-10-CM | POA: Diagnosis not present

## 2022-11-28 DIAGNOSIS — D473 Essential (hemorrhagic) thrombocythemia: Secondary | ICD-10-CM | POA: Diagnosis not present

## 2022-11-28 DIAGNOSIS — Z85820 Personal history of malignant melanoma of skin: Secondary | ICD-10-CM | POA: Diagnosis not present

## 2022-11-28 DIAGNOSIS — Z8673 Personal history of transient ischemic attack (TIA), and cerebral infarction without residual deficits: Secondary | ICD-10-CM | POA: Insufficient documentation

## 2022-11-28 DIAGNOSIS — R300 Dysuria: Secondary | ICD-10-CM

## 2022-11-28 DIAGNOSIS — D72828 Other elevated white blood cell count: Secondary | ICD-10-CM | POA: Diagnosis not present

## 2022-11-28 DIAGNOSIS — Z8589 Personal history of malignant neoplasm of other organs and systems: Secondary | ICD-10-CM | POA: Diagnosis not present

## 2022-11-28 LAB — BASIC METABOLIC PANEL
BUN: 21 (ref 4–21)
CO2: 24 — AB (ref 13–22)
Chloride: 108 (ref 99–108)
Creatinine: 1.1 (ref 0.5–1.1)
EGFR: 48
Glucose: 98
Potassium: 4.3 mEq/L (ref 3.5–5.1)
Sodium: 141 (ref 137–147)

## 2022-11-28 LAB — URINALYSIS, COMPLETE (UACMP) WITH MICROSCOPIC
Bilirubin Urine: NEGATIVE
Glucose, UA: NEGATIVE mg/dL
Hgb urine dipstick: NEGATIVE
Ketones, ur: NEGATIVE mg/dL
Nitrite: NEGATIVE
Protein, ur: 30 mg/dL — AB
Specific Gravity, Urine: 1.017 (ref 1.005–1.030)
WBC, UA: >50 WBC/hpf (ref 0–5)
pH: 5 (ref 5.0–8.0)

## 2022-11-28 LAB — HEPATIC FUNCTION PANEL
ALT: 23 U/L (ref 7–35)
AST: 36 — AB (ref 13–35)
Alkaline Phosphatase: 79 (ref 25–125)
Bilirubin, Total: 0.5

## 2022-11-28 LAB — CBC AND DIFFERENTIAL
HCT: 34 — AB (ref 36–46)
Hemoglobin: 11 — AB (ref 12.0–16.0)
Neutrophils Absolute: 12.21
Platelets: 115 10*3/uL — AB (ref 150–400)
WBC: 17.2

## 2022-11-28 LAB — CBC: RBC: 3.51 — AB (ref 3.87–5.11)

## 2022-11-28 LAB — COMPREHENSIVE METABOLIC PANEL
Albumin: 4.2 (ref 3.5–5.0)
Calcium: 8.5 — AB (ref 8.7–10.7)

## 2022-11-29 ENCOUNTER — Other Ambulatory Visit: Payer: Self-pay

## 2022-11-29 DIAGNOSIS — D473 Essential (hemorrhagic) thrombocythemia: Secondary | ICD-10-CM

## 2022-11-30 LAB — URINE CULTURE: Culture: 80000 — AB

## 2022-12-01 LAB — URINE CULTURE

## 2022-12-03 ENCOUNTER — Telehealth: Payer: Self-pay

## 2022-12-03 NOTE — Telephone Encounter (Signed)
-----   Message from Dellia Beckwith, MD sent at 11/28/2022  6:33 PM EDT ----- Regarding: refer Pls refer to Dr. Hope Pigeon, Urogynecologist in [place] for: Bladder/uterine prolapse Urinary incontinence Recurrent UTI's  Hx vulvar cancer years ago

## 2022-12-03 NOTE — Telephone Encounter (Signed)
Referral faxed to Dr. Staci Righter office

## 2022-12-04 ENCOUNTER — Telehealth: Payer: Self-pay

## 2022-12-04 ENCOUNTER — Other Ambulatory Visit: Payer: Self-pay | Admitting: Oncology

## 2022-12-04 DIAGNOSIS — N3000 Acute cystitis without hematuria: Secondary | ICD-10-CM

## 2022-12-04 MED ORDER — SULFAMETHOXAZOLE-TRIMETHOPRIM 800-160 MG PO TABS: 1.0000 | ORAL_TABLET | Freq: Two times a day (BID) | ORAL | 0 refills | Status: AC

## 2022-12-04 NOTE — Telephone Encounter (Signed)
Called patient and notified her of the urine results and that Bactrim was sent to [facility] on [address].

## 2022-12-04 NOTE — Telephone Encounter (Signed)
-----   Message from Dellia Beckwith, MD sent at 12/04/2022  1:43 PM EDT ----- Regarding: RE: Urinalysis She does have a UTI, sens to sulfa,she is allergic to almost everything else.I will send in Bactrim now. ----- Message ----- From: Jeannette Corpus, LPN Sent: 1/61/0960  11:09 AM EDT To: Dellia Beckwith, MD Subject: Urinalysis                                     Patient has called wanting to know what her urinalysis and culture has showed. Results are in Epic.

## 2022-12-11 DIAGNOSIS — Z8673 Personal history of transient ischemic attack (TIA), and cerebral infarction without residual deficits: Secondary | ICD-10-CM | POA: Diagnosis not present

## 2022-12-11 DIAGNOSIS — E785 Hyperlipidemia, unspecified: Secondary | ICD-10-CM | POA: Diagnosis not present

## 2022-12-11 DIAGNOSIS — Z89419 Acquired absence of unspecified great toe: Secondary | ICD-10-CM | POA: Diagnosis not present

## 2022-12-11 DIAGNOSIS — M159 Polyosteoarthritis, unspecified: Secondary | ICD-10-CM | POA: Diagnosis not present

## 2022-12-11 DIAGNOSIS — I10 Essential (primary) hypertension: Secondary | ICD-10-CM | POA: Diagnosis not present

## 2022-12-11 DIAGNOSIS — M19012 Primary osteoarthritis, left shoulder: Secondary | ICD-10-CM | POA: Diagnosis not present

## 2022-12-11 DIAGNOSIS — Z6828 Body mass index (BMI) 28.0-28.9, adult: Secondary | ICD-10-CM | POA: Diagnosis not present

## 2022-12-11 DIAGNOSIS — Z8719 Personal history of other diseases of the digestive system: Secondary | ICD-10-CM | POA: Diagnosis not present

## 2022-12-11 DIAGNOSIS — H539 Unspecified visual disturbance: Secondary | ICD-10-CM | POA: Diagnosis not present

## 2022-12-11 DIAGNOSIS — K219 Gastro-esophageal reflux disease without esophagitis: Secondary | ICD-10-CM | POA: Diagnosis not present

## 2022-12-16 ENCOUNTER — Ambulatory Visit (INDEPENDENT_AMBULATORY_CARE_PROVIDER_SITE_OTHER): Payer: PPO

## 2022-12-16 DIAGNOSIS — I6340 Cerebral infarction due to embolism of unspecified cerebral artery: Secondary | ICD-10-CM

## 2022-12-16 LAB — CUP PACEART REMOTE DEVICE CHECK
Date Time Interrogation Session: 20240428231316
Implantable Pulse Generator Implant Date: 20210413

## 2022-12-17 ENCOUNTER — Encounter: Payer: Self-pay | Admitting: Oncology

## 2022-12-23 NOTE — Progress Notes (Signed)
Carelink Summary Report / Loop Recorder 

## 2023-01-07 ENCOUNTER — Telehealth: Payer: Self-pay

## 2023-01-07 ENCOUNTER — Other Ambulatory Visit: Payer: Self-pay | Admitting: Oncology

## 2023-01-07 ENCOUNTER — Other Ambulatory Visit: Payer: Self-pay

## 2023-01-07 ENCOUNTER — Inpatient Hospital Stay: Payer: PPO | Attending: Oncology

## 2023-01-07 DIAGNOSIS — D473 Essential (hemorrhagic) thrombocythemia: Secondary | ICD-10-CM | POA: Diagnosis not present

## 2023-01-07 DIAGNOSIS — R300 Dysuria: Secondary | ICD-10-CM

## 2023-01-07 DIAGNOSIS — B961 Klebsiella pneumoniae [K. pneumoniae] as the cause of diseases classified elsewhere: Secondary | ICD-10-CM | POA: Insufficient documentation

## 2023-01-07 DIAGNOSIS — N390 Urinary tract infection, site not specified: Secondary | ICD-10-CM | POA: Insufficient documentation

## 2023-01-07 LAB — URINALYSIS, COMPLETE (UACMP) WITH MICROSCOPIC
Bilirubin Urine: NEGATIVE
Glucose, UA: NEGATIVE mg/dL
Hgb urine dipstick: NEGATIVE
Ketones, ur: NEGATIVE mg/dL
Nitrite: NEGATIVE
Protein, ur: 100 mg/dL — AB
Specific Gravity, Urine: 1.015 (ref 1.005–1.030)
WBC, UA: >50 WBC/hpf (ref 0–5)
pH: 5 (ref 5.0–8.0)

## 2023-01-07 MED ORDER — NITROFURANTOIN MONOHYD MACRO 100 MG PO CAPS
100.0000 mg | ORAL_CAPSULE | Freq: Two times a day (BID) | ORAL | 0 refills | Status: AC
Start: 2023-01-07 — End: 2023-01-17

## 2023-01-07 NOTE — Telephone Encounter (Signed)
Pt called, she is miserable. She states she has to get up during the night to change panties. I know she is sending me to a specialist but at this rate, I want be able to make the trip. "This UTI is worse than before. Is there anything else she can give me, like an old drug? No hematuria. Afebrile. She did complete all of last antibiotic. Please advise.

## 2023-01-09 LAB — URINE CULTURE: Culture: >=100000 — AB

## 2023-01-17 NOTE — Progress Notes (Signed)
Carelink Summary Report / Loop Recorder 

## 2023-01-20 ENCOUNTER — Ambulatory Visit (INDEPENDENT_AMBULATORY_CARE_PROVIDER_SITE_OTHER): Payer: PPO

## 2023-01-20 DIAGNOSIS — I639 Cerebral infarction, unspecified: Secondary | ICD-10-CM

## 2023-01-20 LAB — CUP PACEART REMOTE DEVICE CHECK
Date Time Interrogation Session: 20240602231113
Implantable Pulse Generator Implant Date: 20210413

## 2023-01-23 DIAGNOSIS — N368 Other specified disorders of urethra: Secondary | ICD-10-CM | POA: Diagnosis not present

## 2023-01-23 DIAGNOSIS — N952 Postmenopausal atrophic vaginitis: Secondary | ICD-10-CM | POA: Diagnosis not present

## 2023-01-23 DIAGNOSIS — R82998 Other abnormal findings in urine: Secondary | ICD-10-CM | POA: Diagnosis not present

## 2023-01-23 DIAGNOSIS — R8290 Unspecified abnormal findings in urine: Secondary | ICD-10-CM | POA: Diagnosis not present

## 2023-01-23 DIAGNOSIS — R21 Rash and other nonspecific skin eruption: Secondary | ICD-10-CM | POA: Diagnosis not present

## 2023-01-23 DIAGNOSIS — N3281 Overactive bladder: Secondary | ICD-10-CM | POA: Diagnosis not present

## 2023-02-11 NOTE — Progress Notes (Signed)
Carelink Summary Report / Loop Recorder 

## 2023-02-17 DIAGNOSIS — R8290 Unspecified abnormal findings in urine: Secondary | ICD-10-CM | POA: Diagnosis not present

## 2023-02-17 DIAGNOSIS — Z87440 Personal history of urinary (tract) infections: Secondary | ICD-10-CM | POA: Diagnosis not present

## 2023-02-24 ENCOUNTER — Ambulatory Visit (INDEPENDENT_AMBULATORY_CARE_PROVIDER_SITE_OTHER): Payer: PPO

## 2023-02-24 DIAGNOSIS — I639 Cerebral infarction, unspecified: Secondary | ICD-10-CM

## 2023-02-24 LAB — CUP PACEART REMOTE DEVICE CHECK
Date Time Interrogation Session: 20240705230450
Implantable Pulse Generator Implant Date: 20210413

## 2023-02-26 NOTE — Progress Notes (Addendum)
[facility]  [address] [place],  [place]  [address] [phone]   Addendum:  Urine culture revealed Klebsiella pneumoniae again, but now resistant to nitrofurantoin. I will place her on Bactrim DS twice daily for 10 days and plan to repeat a urine culture in 2 weeks. She will resume vitamin C and probiotic. She will try cranberry and d-mannose as well.  Clinic Day:  02/27/2023  Referring physician: Lucianne Lei, MD  ASSESSMENT & PLAN:   Assessment & Plan: Recurrent urinary tract infection She has persistent symptoms of UTI despite recent treatment with nitrofurantoin.  We will obtain urinalysis and urine culture today and plan to contact Dr. Hope Pigeon with the results.  I encouraged the patient to try the Azo-pyridium over the counter and at least some of the UTI prevention recommendations.  Essential thrombocythemia (HCC) Essential thrombocythemia previously treated with hydroxyurea.  Due to low normal platelets, hydroxyurea was discontinued in October 2021.  She has persistent leukocytosis/neutrophilia, which she has had on and off since 2018.  This is felt to be due to her myeloproliferative disease.  Her counts remain stable, so we recommend continued observation.  We are seeing metamyelocytes and myelocytes again at this time.  We will plan to see her back in 3 months for repeat clinical assessment.  Gastrointestinal bleeding Acute blood loss anemia in January 2023. She was admitted with an esophageal tear.  Her hemoglobin dropped to 6.9 during admission, so she received 1 unit of packed red blood cells with improvement in her hemoglobin to 8.2. The hemoglobin  was up to 10.2 when she was seen in February 2023 and has remained stable.  She does not have evidence of recurrent GI bleeding.    The patient understands the plans discussed today and is in agreement with them.  She knows to contact our office if she develops concerns prior to her next  appointment.   I provided 15 minutes of face-to-face time during this encounter and > 50% was spent counseling as documented under my assessment and plan.    Adah Perl, PA-C  [facility] AT [facility] [address] [place] [address] Dept: [phone] Dept Fax: [phone]   Orders Placed This Encounter  Procedures   Miscellaneous test (send-out)    Standing Status:   Future    Standing Expiration Date:   02/27/2024    Order Specific Question:   Test name / description:    Answer:   stat urinalysis and urine culture to [facility]   CBC and differential    This external order was created through the Results Console.   CBC    This external order was created through the Results Console.      CHIEF COMPLAINT:  CC: Essential thrombocythemia  Current Treatment: Surveillance  HISTORY OF PRESENT ILLNESS:  Yolanda Flowers is a 82 year old female with a history of essential thrombocythemia originally diagnosed in 33.  She was treated with hydroxyurea 500 mg 3 times daily.  We had to occasionally adjust doses because of leukopenia or anemia.  She also had a malignant melanoma of her left first toe resected in November 2015 with partial amputation of the toe and has done well.  She has had multiple other skin cancers removed over the years.  In March 2017, she was seen by Dr. Andrey Farmer for HSIL/VIN 3 of the right posterior labia minora and underwent vulvectomy.  Hydroxyurea was decreased to 500 mg  twice daily prior to surgery, as we did not feel we could safely hold hydroxyurea.  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  Bone density scan in March 2018 revealed osteopenia with a T-score of -1.6 in the femur, for which she is on calcium and vitamin-D.  She has had chronic back pain, which is attributed to severe degenerative disease.  She had an MRI of  the lumbar spine in March 2018, which revealed significant degenerative disc disease with mild spinal stenosis at L1 and L4, but moderate at L2-3.  Dr. Ollen Bowl has given her injections, as well at hydrocodone/APAP 5/325.  She has had intermittent leukocytosis since 2018, felt to be secondary to her essential thrombocythemia. She was evaluated in the emergency department in October 2019 and had a GI bleed. CT abdomen and pelvis revealed acute uncomplicated diverticulitis of the sigmoid colon with no other acute abnormalities.  A moderate to large hiatal hernia was seen. She saw Dr. Tomie China and underwent nuclear med stress test in November, which did not reveal any evidence of inducible ischemia or other abnormality.  Left ventricular size and function was normal with an ejection fraction of 79%.  She underwent EGD and colonoscopy in December 2019, and no significant source of blood loss was identified. She was instructed to continue lansoprazole.  She had dilatation of an esophageal stricture and removal of a polyp. Pathology revealed a fragmented tubular adenoma.    In January 2020, she had worsening anemia, in addition to continued episodes of chest pain and worsening dyspnea with exertion at her routine follow-up. Due to the severe dyspnea and right lower extremity edema, we obtained a CT angiogram chest, which did not reveal any evidence of pulmonary embolism.  There was good opacification of the pulmonary arteries.  Diffuse coronary artery calcifications with cardiomegaly and moderate thoracic aortic atherosclerosis was seen.  There was a 3.6 mm noncalcified nodule in the anterior inferior right upper lobe.  Non-contrasted CT chest in 6-12 months was recommended.  Right lower extremity venous Doppler ultrasound did not reveal any evidence of deep venous thrombosis. In February 2020, she had persistent anemia with a hemoglobin of 9.7. As she was symptomatic, hydroxyurea 500 mg was decreased to once daily.   Soluble transferrin was elevated, which was consistent with iron deficiency.  She saw Dr. Jennye Boroughs and he placed her on iron supplementation daily in the form of ferrous sulfate 65 mg. She was seen again in March and had improvement in her anemia with a hemoglobin of 10.2.  Her thrombocythemia remained controlled on hydroxyurea 500 mg daily. We encouraged her to have follow-up for her vulvar cancer, so followed up with Dr. Alison Murray and her exam was normal.     She had a bilateral screening mammogram in February 2021, which revealed an area of calcifications in the right breast warranting further evaluation.  A diagnostic right mammogram was ordered, but this was delayed, because she underwent open heart surgery with CABG x4 in March.  She had a monitor placed in the left chest wall the time of surgery.  She did not feel she can have mammogram due to the monitor in the left chest wall.  She then had 2 strokes following her surgery, from which she has recovered well, except for balance issues and left eye blindness.  She ambulates with a cane or walker depending on her circumstances.  She is on clopidogrel 75 mg daily, atorvastatin  80 mg daily, metoprolol 25 mg twice daily and pantoprazole 40 mg daily. She had a CT chest in July to follow-up on the pulmonary nodule which were stable. She finally underwent diagnostic right mammogram from July which revealed likely benign 6 mm group of calcifications involving the upper inner quadrant at middle depth. Bilateral diagnostic mammogram in February 2022 was recommended  At her visit in October 2021, she reported elevated transaminases and purpura, which was attributed to atorvastatin.  Dr. Tomie China reduced the dose from 80 mg to 40 mg with normalization of the transaminases and no further purpura.  Her platelets were in low normal range, so hydroxyurea was held and since her platelets have remained normal, hydroxyurea was discontinued. She has had intermittent  mild leukocytosis/neutrophilia since 2018.  She has occasionally had immature cells on her blood smear, but not blasts.    When I saw her in May 2022, she had worsening leukocytosis with persistent immature cells and worsening anemia, so I recommended a bone marrow biopsy, but this was never done as the patient was hospitalized with a stroke and discharged to rehab. She was seen again in August, at which time her white count had decreased from 24.2 to 19.9 with an ANC of 1473, with 11% lymphocytes, 10% monocytes and 2.4 % basophils and her hemoglobin had improved from 10.6 to 11.3. The platelet count remained normal, so continued observation was recommended.  Her blood counts were stable in November.   She was admitted in January 2023 with a 3 day history of sensation of an object stuck in her throat following attempted swallowing of calcium supplementation. She was eventually able to dislodge item after several hours.  She then developed stool that she described as "motor oil" for 2 days. She was evaluated in the emergency room and was found to have hemoglobin of 8.3, decreased from 11.4 in November 2022.   She underwent urgent EGD and was found to have a 3 cm mucosal tear within the distal 5 cm of her esophagus, felt to be most likely secondary to mucosal tear following calcium carbonate impaction. CT neck and chest was performed to evaluate for possibility of mediastinitis developing following possible full focus mucosal tear. This imaging did not reveal active inflammatory changes. 3 clips were placed closing the tear successfully and complete hemostasis was achieved.  She was placed on twice daily pantoprazole with slow advancement of diet from clear liquid to house select over 2 days.  She had an acute drop in hemoglobin to 6.9 following further equilibration, so received 1 unit PRBC with appropriate response to 8.2 g/dL. Her hemoglobin remained at approximately 8 for the remainder of admission.  She was  instructed to take iron supplementation while at home and to avoid calcium carbonate for time being and to transition to over-the-counter chewable supplementation.  Iron/TIBC did not reveal iron deficiency.  Her hemoglobin later improved.  Review her blood smear in March revealed occasional myelocytes, occasional nucleated RBC's, poikilocytosis, and 1 promyelocyte.   We have been concerned about transformation to leukemia, with the changes on peripheral smear.  Her white blood cell count has been running between 16.5 and 24.1, her hemoglobin has been running between 10.2 and 11.7 and platelets running between 108,000 and 129,000 for the past year.  As her counts have been fairly stable, we have continued observation.  Bilateral diagnostic mammogram in January did not reveal any evidence of malignancy, screening mammogram in 1 year was recommended.  INTERVAL HISTORY:  Yolanda Flowers is  here today for repeat clinical assessment. She denies fevers or chills. She denies pain. Her appetite is good. Her weight has decreased 6 pounds over last 3 months .  She reports continued frequent urinary tract infections.  The patient states she was most recently on nitrofurantoin for 5 days without improvement in her urinary symptoms.  She was also placed on Estrace cream, but due to the severe bladder pain, she was unable to use the Estrace cream.  She had been on Myrbetriq, but did urinary incontinence with this, so was switched to trospium chloride.  Review of her records reveals she had Klebsiella pneumonia >100,000 colonies in the urine on July 1, which was susceptible to nitrofurantoin.  She started seeing Dr. Hope Pigeon, urologist, in June, at which time she started her on Myrbetriq and Estrace. Myrbetriq was discontinued due to intolerance, and she was started on trospium.  Also, for bladder pain and burning with urination, she recommended  over the counter AZO (Pyridium), no more than 3 days at a time was recommended.  She  gave the patient a plan for UTI prevention including increased fluid intake, Proanthocyanidin (PAC) supplement (36 mg daily). Brand recommendations: Avel Peace, or TheraCran One,  D-mannose powder (2 grams daily). Can be used as an alternative or in addition to the concentrated cranberry, Probiotic to maintain healthy bladder microbiome. Brand recommendations: Darrold Junker (includes probiotic & D-mannose), Feminine Balance (highest concentration of lactobacillus), or Hyperbiotic Pro 15, and Vitamin C supplement.  The patient states she has stopped her vitamin C and probiotic, but will resume.  She has tried the AZO pyridium.  She states the estrace cream causes burning of the skin and vagina.   REVIEW OF SYSTEMS:  Review of Systems  Constitutional:  Positive for fatigue. Negative for appetite change, chills, fever and unexpected weight change.  HENT:   Negative for lump/mass, mouth sores, nosebleeds and sore throat.   Respiratory:  Negative for cough and shortness of breath.   Cardiovascular:  Negative for chest pain and leg swelling.  Gastrointestinal:  Negative for abdominal pain, blood in stool, constipation, diarrhea, nausea and vomiting.  Endocrine: Negative for hot flashes.  Genitourinary:  Positive for dysuria and pelvic pain. Negative for difficulty urinating, frequency, hematuria and vaginal bleeding.   Musculoskeletal:  Positive for gait problem (Due to previous CVA, using cane). Negative for arthralgias, back pain and myalgias.  Neurological:  Positive for gait problem (Due to previous CVA, using cane). Negative for dizziness and headaches.  Hematological:  Negative for adenopathy. Does not bruise/bleed easily.  Psychiatric/Behavioral:  Negative for depression and sleep disturbance. The patient is not nervous/anxious.      VITALS:  Blood pressure 131/79, pulse 64, temperature 98.8 F (37.1 C), temperature source Oral, resp. rate 18, height 5\' 5"  (1.651 m), weight 150 lb 3.2 oz (68.1 kg),  SpO2 95%.  Wt Readings from Last 3 Encounters:  02/27/23 150 lb 3.2 oz (68.1 kg)  11/28/22 156 lb 6.4 oz (70.9 kg)  09/20/22 156 lb 9.6 oz (71 kg)    Body mass index is 24.99 kg/m.  Performance status (ECOG): 2 - Symptomatic, <50% confined to bed  PHYSICAL EXAM:  Physical Exam Vitals and nursing note reviewed.  Constitutional:      General: She is not in acute distress.    Appearance: Normal appearance. She is ill-appearing (Chronically ill appearing).  HENT:     Head: Normocephalic and atraumatic.     Mouth/Throat:     Mouth: Mucous membranes are moist.  Pharynx: Oropharynx is clear. No oropharyngeal exudate or posterior oropharyngeal erythema.  Eyes:     General: No scleral icterus.    Extraocular Movements: Extraocular movements intact.     Conjunctiva/sclera: Conjunctivae normal.     Pupils: Pupils are equal, round, and reactive to light.  Cardiovascular:     Rate and Rhythm: Normal rate and regular rhythm.     Heart sounds: Normal heart sounds. No murmur heard.    No friction rub. No gallop.  Pulmonary:     Effort: Pulmonary effort is normal.     Breath sounds: Normal breath sounds. No wheezing, rhonchi or rales.  Abdominal:     General: There is no distension.     Palpations: Abdomen is soft. There is no hepatomegaly, splenomegaly or mass.     Tenderness: There is abdominal tenderness in the suprapubic area.  Musculoskeletal:        General: Normal range of motion.     Cervical back: Normal range of motion and neck supple. No tenderness.     Right lower leg: Edema (Chronic, 1+) present.     Left lower leg: No edema.  Lymphadenopathy:     Cervical: No cervical adenopathy.     Upper Body:     Right upper body: No supraclavicular or axillary adenopathy.     Left upper body: No supraclavicular or axillary adenopathy.     Lower Body: No right inguinal adenopathy. No left inguinal adenopathy.  Skin:    General: Skin is warm and dry.     Coloration: Skin is not  jaundiced.     Findings: No rash.  Neurological:     Mental Status: She is alert and oriented to person, place, and time.     Cranial Nerves: No cranial nerve deficit.  Psychiatric:        Mood and Affect: Mood normal.        Behavior: Behavior normal.        Thought Content: Thought content normal.     LABS:      Latest Ref Rng & Units 02/27/2023   12:00 AM 11/28/2022   12:00 AM 08/02/2022    1:36 PM  CBC  WBC  19.8     17.2     18.6   Hemoglobin 12.0 - 16.0 10.9     11.0     11.3   Hematocrit 36 - 46 34     34     36.4   Platelets 150 - 400 K/uL 111     115     135      This result is from an external source.      Latest Ref Rng & Units 11/28/2022   12:00 AM 05/03/2022   12:00 AM 10/17/2021   12:00 AM  CMP  BUN 4 - 21 21     18     17    Creatinine 0.5 - 1.1 1.1     1.0     1.0   Sodium 137 - 147 141     142     143   Potassium 3.5 - 5.1 mEq/L 4.3     4.5     4.5   Chloride 99 - 108 108     109     108   CO2 13 - 22 24     23     27    Calcium 8.7 - 10.7 8.5     8.8     8.7  Alkaline Phos 25 - 125 79     76     80   AST 13 - 35 36     36     30   ALT 7 - 35 U/L 23     26     22       This result is from an external source.     No results found for: "CEA1", "CEA" / No results found for: "CEA1", "CEA" No results found for: "PSA1" No results found for: "YNW295" No results found for: "CAN125"  No results found for: "TOTALPROTELP", "ALBUMINELP", "A1GS", "A2GS", "BETS", "BETA2SER", "GAMS", "MSPIKE", "SPEI" Lab Results  Component Value Date   TIBC 484 (H) 10/17/2021   TIBC 358 07/10/2021   TIBC 305 12/18/2020   FERRITIN 16 10/17/2021   FERRITIN 29 09/19/2021   FERRITIN 59 07/10/2021   IRONPCTSAT 18 10/17/2021   IRONPCTSAT 26 07/10/2021   IRONPCTSAT 15.4 12/18/2020   No results found for: "LDH"  STUDIES:  CUP PACEART REMOTE DEVICE CHECK  Result Date: 02/24/2023 ILR summary report received. Battery status OK. Normal device function. No new symptom, tachy, brady,  or pause episodes. No new AF episodes. Monthly summary reports and ROV/PRN. [facility], [facility].     HISTORY:   Past Medical History:  Diagnosis Date   Abdominal pain    Accelerating angina (HCC) 08/26/2019   Acute blood loss anemia 12/14/2019   Acute cardioembolic stroke (HCC) 11/30/2019   Allergic rhinitis 06/21/2014   Anxiety    Arthritis    Asthma    Atherosclerosis of arteries 08/24/2019   Bilateral edema of lower extremity    Body mass index (BMI) 25.0-25.9, adult 08/24/2019   CAD (coronary artery disease) 10/25/2019   Cellulitis of right leg    Chronic low back pain 01/27/2018   Combined hyperlipidemia 08/24/2019   Complication of anesthesia    difficulty waking   Congestive heart failure (CHF) (HCC)    COPD (chronic obstructive pulmonary disease) (HCC)    Coronary artery disease    Coronary artery disease involving native heart without angina pectoris    Dyspnea 02/09/2019   Dyspnea on exertion    Dysuria 09/19/2021   Embolic stroke (HCC) 11/30/2019   Essential (primary) hypertension 02/20/2021   Essential thrombocythemia (HCC) 06/22/2014   Essential thrombocytosis (HCC) 06/22/2014   Gastrointestinal bleeding 06/23/2018   GERD without esophagitis 06/21/2014   History of melanoma excision    left greast toe 2015   History of squamous cell carcinoma excision    face--  multiple excisions   History of subdural hematoma    2008   HTN (hypertension) 06/21/2014   Hypertension    Ischial bursitis, right 08/24/2019   Leucocytosis 12/14/2019   Leukocytosis 12/14/2019   Malignant melanoma of great toe (HCC) 08/24/2019   Melanoma in situ (HCC) 06/29/2014   Mixed hyperlipidemia 08/24/2019   Opioid dependence (HCC) 08/24/2019   OSA (obstructive sleep apnea)    intolerant   Pedal edema 08/24/2019   Pneumonia of both lungs due to infectious organism 12/20/2020   Recurrent urinary tract infection 02/27/2023   Renal insufficiency 08/24/2019   S/P CABG x 4 11/04/2019   Scoliosis  (and kyphoscoliosis), idiopathic 01/27/2018   Skin cancer 08/24/2019   Solitary pulmonary nodule on lung CT 02/09/2019   Squamous cell carcinoma, scalp/neck 08/24/2019   Stroke (HCC) 12/21/2020   Stroke due to embolism (HCC) 11/25/2019   Subdural hematoma (HCC) 12/20/2020   Swelling of limb 08/24/2019   Traumatic subdural hemorrhage  with loss of consciousness of unspecified duration, initial encounter (HCC) 02/20/2021   VIN III (vulvar intraepithelial neoplasia III)    Vitamin B 12 deficiency 08/24/2019   Vitamin D deficiency 08/24/2019   Wears dentures    UPPER AND LOWER PARTIAL   Wears glasses     Past Surgical History:  Procedure Laterality Date   ABDOMINAL HYSTERECTOMY  age 86   CARDIAC CATHETERIZATION  03/172021   CORONARY ARTERY BYPASS GRAFT N/A 11/04/2019   Procedure: CORONARY ARTERY BYPASS GRAFTING (CABG) x 4, with ENDOSCOPIC HARVESTING OF RIGHT GREATER SAPHENOUS VEIN.;  Surgeon: Alleen Borne, MD;  Location: [facility] OR;  Service: Open Heart Surgery;  Laterality: N/A;   CORONARY ULTRASOUND/IVUS N/A 11/03/2019   Procedure: Intravascular Ultrasound/IVUS;  Surgeon: Yvonne Kendall, MD;  Location: [facility] INVASIVE CV LAB;  Service: Cardiovascular;  Laterality: N/A;   KNEE ARTHROSCOPY Left 2004   LEFT HEART CATH AND CORONARY ANGIOGRAPHY N/A 11/03/2019   Procedure: LEFT HEART CATH AND CORONARY ANGIOGRAPHY;  Surgeon: Yvonne Kendall, MD;  Location: [facility] INVASIVE CV LAB;  Service: Cardiovascular;  Laterality: N/A;   LOOP RECORDER INSERTION N/A 11/30/2019   Procedure: LOOP RECORDER INSERTION;  Surgeon: Hillis Range, MD;  Location: [facility] INVASIVE CV LAB;  Service: Cardiovascular;  Laterality: N/A;   MELANOMA EXCISION  2015   left great toe   REPAIR PERONEAL TENDONS ANKLE  2004   SUBDURAL HEMATOMA EVACUATION VIA CRANIOTOMY  2008      week later  post-op  [facility]   TEE WITHOUT CARDIOVERSION N/A 11/04/2019   Procedure: TRANSESOPHAGEAL ECHOCARDIOGRAM (TEE);  Surgeon: Alleen Borne, MD;   Location[facility];  Service: Open Heart Surgery;  Laterality: N/A;   VULVECTOMY N/A 10/24/2015   Procedure: WIDE LOCAL EXCISION OF THE VULVA ;  Surgeon: Adolphus Birchwood, MD;  Location: [facility];  Service: Gynecology;  Laterality: N/A;    Family History  Problem Relation Age of Onset   Clotting disorder Mother    Hypertension Mother    Heart disease Mother    Arthritis Mother    Stroke Father    Hypertension Father    Heart disease Father    Arthritis Father    Emphysema Father    Asthma Father    Kidney failure Father    Hypertension Sister    Heart disease Sister    Arthritis Sister    Hypertension Brother    Heart disease Brother    Arthritis Brother     Social History:  reports that she has never smoked. She has never used smokeless tobacco. She reports that she does not drink alcohol and does not use drugs.The patient is alone today.  Allergies:  Allergies  Allergen Reactions   Cephalexin Hives   Penicillin G    Ciprofloxacin Other (See Comments)    Gi intolerance   Penicillins Rash    Childhood reaction Did it involve swelling of the face/tongue/throat, SOB, or low BP? No Did it involve sudden or severe rash/hives, skin peeling, or any reaction on the inside of your mouth or nose? No Did you need to seek medical attention at a hospital or doctor's office? No When did it last happen?      50 years ago If all above answers are "NO", may proceed with cephalosporin use.     Current Medications: Current Outpatient Medications  Medication Sig Dispense Refill   ammonium lactate (LAC-HYDRIN) 12 % lotion Apply 1 Application topically as needed for dry skin.  aspirin EC 81 MG tablet Take 1 tablet (81 mg total) by mouth daily. Swallow whole. 90 tablet 3   atorvastatin (LIPITOR) 20 MG tablet Take 1 tablet (20 mg total) by mouth daily. 90 tablet 3   b complex vitamins capsule Take 1 capsule by mouth daily.     estradiol (ESTRACE) 0.1 MG/GM vaginal cream Place  vaginally. (Patient not taking: Reported on 02/27/2023)     fluticasone (FLONASE) 50 MCG/ACT nasal spray Place 2 sprays into both nostrils at bedtime.      magnesium oxide (MAG-OX) 400 MG tablet Take 400 mg by mouth daily.     meclizine (ANTIVERT) 12.5 MG tablet Take 12.5 mg by mouth 2 (two) times daily as needed for dizziness.     metoprolol tartrate (LOPRESSOR) 25 MG tablet TAKE 1 TABLET BY MOUTH TWICE A DAY 180 tablet 1   Multiple Vitamins-Minerals (PRESERVISION AREDS 2) CAPS Take 1 capsule by mouth in the morning and at bedtime.      omeprazole (PRILOSEC) 20 MG capsule Take 20 mg by mouth daily.     Trospium Chloride 60 MG CP24 Take 1 capsule by mouth daily.     Vitamin D, Ergocalciferol, (DRISDOL) 1.25 MG (50000 UT) CAPS capsule Take 50,000 Units by mouth every 14 (fourteen) days.     Wheat Dextrin (BENEFIBER PO) Take 1 tablet by mouth daily. (Patient not taking: Reported on 02/27/2023)     No current facility-administered medications for this visit.

## 2023-02-27 ENCOUNTER — Ambulatory Visit: Payer: PPO | Admitting: Oncology

## 2023-02-27 ENCOUNTER — Inpatient Hospital Stay: Payer: PPO | Admitting: Hematology and Oncology

## 2023-02-27 ENCOUNTER — Encounter: Payer: Self-pay | Admitting: Hematology and Oncology

## 2023-02-27 ENCOUNTER — Inpatient Hospital Stay: Payer: PPO | Attending: Oncology

## 2023-02-27 VITALS — BP 131/79 | HR 64 | Temp 98.8°F | Resp 18 | Ht 65.0 in | Wt 150.2 lb

## 2023-02-27 DIAGNOSIS — K922 Gastrointestinal hemorrhage, unspecified: Secondary | ICD-10-CM | POA: Diagnosis not present

## 2023-02-27 DIAGNOSIS — I2510 Atherosclerotic heart disease of native coronary artery without angina pectoris: Secondary | ICD-10-CM | POA: Diagnosis not present

## 2023-02-27 DIAGNOSIS — D473 Essential (hemorrhagic) thrombocythemia: Secondary | ICD-10-CM

## 2023-02-27 DIAGNOSIS — E538 Deficiency of other specified B group vitamins: Secondary | ICD-10-CM | POA: Insufficient documentation

## 2023-02-27 DIAGNOSIS — J4489 Other specified chronic obstructive pulmonary disease: Secondary | ICD-10-CM | POA: Diagnosis not present

## 2023-02-27 DIAGNOSIS — N390 Urinary tract infection, site not specified: Secondary | ICD-10-CM | POA: Insufficient documentation

## 2023-02-27 DIAGNOSIS — D649 Anemia, unspecified: Secondary | ICD-10-CM | POA: Diagnosis not present

## 2023-02-27 DIAGNOSIS — G4733 Obstructive sleep apnea (adult) (pediatric): Secondary | ICD-10-CM | POA: Diagnosis not present

## 2023-02-27 DIAGNOSIS — Z951 Presence of aortocoronary bypass graft: Secondary | ICD-10-CM | POA: Diagnosis not present

## 2023-02-27 DIAGNOSIS — Z79899 Other long term (current) drug therapy: Secondary | ICD-10-CM | POA: Insufficient documentation

## 2023-02-27 DIAGNOSIS — Z85820 Personal history of malignant melanoma of skin: Secondary | ICD-10-CM | POA: Diagnosis not present

## 2023-02-27 DIAGNOSIS — D471 Chronic myeloproliferative disease: Secondary | ICD-10-CM | POA: Insufficient documentation

## 2023-02-27 DIAGNOSIS — E559 Vitamin D deficiency, unspecified: Secondary | ICD-10-CM | POA: Insufficient documentation

## 2023-02-27 DIAGNOSIS — Z7982 Long term (current) use of aspirin: Secondary | ICD-10-CM | POA: Diagnosis not present

## 2023-02-27 DIAGNOSIS — R300 Dysuria: Secondary | ICD-10-CM | POA: Diagnosis not present

## 2023-02-27 DIAGNOSIS — Z8673 Personal history of transient ischemic attack (TIA), and cerebral infarction without residual deficits: Secondary | ICD-10-CM | POA: Insufficient documentation

## 2023-02-27 DIAGNOSIS — I110 Hypertensive heart disease with heart failure: Secondary | ICD-10-CM | POA: Insufficient documentation

## 2023-02-27 DIAGNOSIS — I509 Heart failure, unspecified: Secondary | ICD-10-CM | POA: Diagnosis not present

## 2023-02-27 DIAGNOSIS — K219 Gastro-esophageal reflux disease without esophagitis: Secondary | ICD-10-CM | POA: Insufficient documentation

## 2023-02-27 HISTORY — DX: Urinary tract infection, site not specified: N39.0

## 2023-02-27 LAB — CBC AND DIFFERENTIAL
BASO%: 3 %
Bands: 1
HCT: 34 — AB (ref 36–46)
Hemoglobin: 10.9 — AB (ref 12.0–16.0)
LYMPH%: 12 %
MCV: 98 (ref 81–99)
MONO%: 6 %
Metamyelocytes Relative: 2 %
Myelocytes: 1
NEUT%: 75 %
Neutrophils Absolute: 15.05
Platelets: 111 10*3/uL — AB (ref 150–400)
WBC: 19.8

## 2023-02-27 LAB — CMP (CANCER CENTER ONLY)
ALT: 20 U/L (ref 0–44)
AST: 21 U/L (ref 15–41)
Albumin: 4.2 g/dL (ref 3.5–5.0)
Alkaline Phosphatase: 62 U/L (ref 38–126)
Anion gap: 8 (ref 5–15)
BUN: 26 mg/dL — ABNORMAL HIGH (ref 8–23)
CO2: 23 mmol/L (ref 22–32)
Calcium: 8.8 mg/dL — ABNORMAL LOW (ref 8.9–10.3)
Chloride: 107 mmol/L (ref 98–111)
Creatinine: 1.23 mg/dL — ABNORMAL HIGH (ref 0.44–1.00)
GFR, Estimated: 44 mL/min — ABNORMAL LOW (ref >60)
Glucose, Bld: 94 mg/dL (ref 70–99)
Potassium: 4.4 mmol/L (ref 3.5–5.1)
Sodium: 138 mmol/L (ref 135–145)
Total Bilirubin: 0.5 mg/dL (ref 0.3–1.2)
Total Protein: 7.9 g/dL (ref 6.5–8.1)

## 2023-02-27 LAB — CBC: RBC: 3.47 — AB (ref 3.87–5.11)

## 2023-02-27 NOTE — Assessment & Plan Note (Addendum)
Essential thrombocythemia previously treated with hydroxyurea.  Due to low normal platelets, hydroxyurea was discontinued in October 2021.  She has persistent leukocytosis/neutrophilia, which she has had on and off since 2018.  This is felt to be due to her myeloproliferative disease.  Her counts remain stable, so we recommend continued observation.  We are seeing metamyelocytes and myelocytes again at this time.  We will plan to see her back in 3 months for repeat clinical assessment.

## 2023-02-27 NOTE — Assessment & Plan Note (Addendum)
Acute blood loss anemia in January 2023. She was admitted with an esophageal tear.  Her hemoglobin dropped to 6.9 during admission, so she received 1 unit of packed red blood cells with improvement in her hemoglobin to 8.2. The hemoglobin  was up to 10.2 when she was seen in February 2023 and has remained stable.  She does not have evidence of recurrent GI bleeding.

## 2023-02-27 NOTE — Assessment & Plan Note (Addendum)
She has persistent symptoms of UTI despite recent treatment with nitrofurantoin.  We will obtain urinalysis and urine culture today and plan to contact Dr. Hope Pigeon with the results.  I encouraged the patient to try the Azo-pyridium over the counter and at least some of the UTI prevention recommendations.

## 2023-02-28 ENCOUNTER — Encounter: Payer: Self-pay | Admitting: Hematology and Oncology

## 2023-02-28 DIAGNOSIS — M19012 Primary osteoarthritis, left shoulder: Secondary | ICD-10-CM | POA: Diagnosis not present

## 2023-02-28 DIAGNOSIS — Z9181 History of falling: Secondary | ICD-10-CM | POA: Diagnosis not present

## 2023-02-28 DIAGNOSIS — I10 Essential (primary) hypertension: Secondary | ICD-10-CM | POA: Diagnosis not present

## 2023-02-28 DIAGNOSIS — M159 Polyosteoarthritis, unspecified: Secondary | ICD-10-CM | POA: Diagnosis not present

## 2023-02-28 DIAGNOSIS — E785 Hyperlipidemia, unspecified: Secondary | ICD-10-CM | POA: Diagnosis not present

## 2023-02-28 DIAGNOSIS — Z6825 Body mass index (BMI) 25.0-25.9, adult: Secondary | ICD-10-CM | POA: Diagnosis not present

## 2023-02-28 DIAGNOSIS — E559 Vitamin D deficiency, unspecified: Secondary | ICD-10-CM | POA: Diagnosis not present

## 2023-02-28 DIAGNOSIS — E538 Deficiency of other specified B group vitamins: Secondary | ICD-10-CM | POA: Diagnosis not present

## 2023-02-28 DIAGNOSIS — Z89419 Acquired absence of unspecified great toe: Secondary | ICD-10-CM | POA: Diagnosis not present

## 2023-02-28 DIAGNOSIS — Z8673 Personal history of transient ischemic attack (TIA), and cerebral infarction without residual deficits: Secondary | ICD-10-CM | POA: Diagnosis not present

## 2023-02-28 DIAGNOSIS — H539 Unspecified visual disturbance: Secondary | ICD-10-CM | POA: Diagnosis not present

## 2023-02-28 DIAGNOSIS — K219 Gastro-esophageal reflux disease without esophagitis: Secondary | ICD-10-CM | POA: Diagnosis not present

## 2023-02-28 DIAGNOSIS — Z79899 Other long term (current) drug therapy: Secondary | ICD-10-CM | POA: Diagnosis not present

## 2023-03-03 ENCOUNTER — Other Ambulatory Visit: Payer: Self-pay | Admitting: Hematology and Oncology

## 2023-03-03 ENCOUNTER — Telehealth: Payer: Self-pay

## 2023-03-03 DIAGNOSIS — N390 Urinary tract infection, site not specified: Secondary | ICD-10-CM

## 2023-03-03 DIAGNOSIS — D473 Essential (hemorrhagic) thrombocythemia: Secondary | ICD-10-CM

## 2023-03-03 MED ORDER — SULFAMETHOXAZOLE-TRIMETHOPRIM 800-160 MG PO TABS: 1.0000 | ORAL_TABLET | Freq: Two times a day (BID) | ORAL | 0 refills | Status: DC

## 2023-03-03 NOTE — Telephone Encounter (Signed)
Notes faxed.

## 2023-03-03 NOTE — Telephone Encounter (Signed)
-----   Message from Adah Perl sent at 03/03/2023  1:57 PM EDT ----- Please fax my note to Dr. Judson Roch, urology. Thanks

## 2023-03-03 NOTE — Addendum Note (Signed)
Addended by: Belva Crome A on: 03/03/2023 02:09 PM   Modules accepted: Orders

## 2023-03-13 NOTE — Progress Notes (Signed)
Carelink Summary Report / Loop Recorder 

## 2023-03-17 ENCOUNTER — Inpatient Hospital Stay: Payer: PPO

## 2023-03-17 DIAGNOSIS — N390 Urinary tract infection, site not specified: Secondary | ICD-10-CM

## 2023-03-17 LAB — URINALYSIS, COMPLETE (UACMP) WITH MICROSCOPIC
Bacteria, UA: NONE SEEN
Bilirubin Urine: NEGATIVE
Glucose, UA: NEGATIVE mg/dL
Hgb urine dipstick: NEGATIVE
Ketones, ur: NEGATIVE mg/dL
Nitrite: NEGATIVE
Protein, ur: 30 mg/dL — AB
Specific Gravity, Urine: 1.018 (ref 1.005–1.030)
pH: 5 (ref 5.0–8.0)

## 2023-03-19 ENCOUNTER — Other Ambulatory Visit: Payer: Self-pay | Admitting: Hematology and Oncology

## 2023-03-19 ENCOUNTER — Telehealth: Payer: Self-pay | Admitting: Hematology and Oncology

## 2023-03-19 MED ORDER — NITROFURANTOIN MONOHYD MACRO 100 MG PO CAPS: 100.0000 mg | ORAL_CAPSULE | Freq: Two times a day (BID) | ORAL | 0 refills | Status: DC

## 2023-03-19 NOTE — Telephone Encounter (Signed)
Patient's urine culture revealed Enterococcus faecalis sensitive to ampicillin, nitrofurantoin and vancomycin.  Patient has a penicillin allergy, so I prescribed nitrofurantoin.  She has a follow-up with Dr. Hope Pigeon in a few weeks which she will keep.  I encouraged her to try some of the preventative therapies we discussed, as well.

## 2023-03-26 ENCOUNTER — Other Ambulatory Visit: Payer: Self-pay | Admitting: Cardiology

## 2023-03-31 ENCOUNTER — Ambulatory Visit (INDEPENDENT_AMBULATORY_CARE_PROVIDER_SITE_OTHER): Payer: PPO

## 2023-03-31 DIAGNOSIS — I639 Cerebral infarction, unspecified: Secondary | ICD-10-CM

## 2023-04-01 DIAGNOSIS — C44629 Squamous cell carcinoma of skin of left upper limb, including shoulder: Secondary | ICD-10-CM | POA: Diagnosis not present

## 2023-04-01 DIAGNOSIS — L570 Actinic keratosis: Secondary | ICD-10-CM | POA: Diagnosis not present

## 2023-04-14 DIAGNOSIS — N952 Postmenopausal atrophic vaginitis: Secondary | ICD-10-CM | POA: Diagnosis not present

## 2023-04-14 DIAGNOSIS — R82998 Other abnormal findings in urine: Secondary | ICD-10-CM | POA: Diagnosis not present

## 2023-04-14 DIAGNOSIS — R8290 Unspecified abnormal findings in urine: Secondary | ICD-10-CM | POA: Diagnosis not present

## 2023-04-14 DIAGNOSIS — N390 Urinary tract infection, site not specified: Secondary | ICD-10-CM | POA: Diagnosis not present

## 2023-04-14 DIAGNOSIS — N368 Other specified disorders of urethra: Secondary | ICD-10-CM | POA: Diagnosis not present

## 2023-04-15 NOTE — Progress Notes (Signed)
Carelink Summary Report / Loop Recorder 

## 2023-04-17 DIAGNOSIS — Z881 Allergy status to other antibiotic agents status: Secondary | ICD-10-CM | POA: Diagnosis not present

## 2023-04-17 DIAGNOSIS — I517 Cardiomegaly: Secondary | ICD-10-CM | POA: Diagnosis not present

## 2023-04-17 DIAGNOSIS — M1990 Unspecified osteoarthritis, unspecified site: Secondary | ICD-10-CM | POA: Diagnosis not present

## 2023-04-17 DIAGNOSIS — Z89412 Acquired absence of left great toe: Secondary | ICD-10-CM | POA: Diagnosis not present

## 2023-04-17 DIAGNOSIS — Z7982 Long term (current) use of aspirin: Secondary | ICD-10-CM | POA: Diagnosis not present

## 2023-04-17 DIAGNOSIS — Z880 Allergy status to penicillin: Secondary | ICD-10-CM | POA: Diagnosis not present

## 2023-04-17 DIAGNOSIS — Z85820 Personal history of malignant melanoma of skin: Secondary | ICD-10-CM | POA: Diagnosis not present

## 2023-04-17 DIAGNOSIS — E860 Dehydration: Secondary | ICD-10-CM | POA: Diagnosis not present

## 2023-04-17 DIAGNOSIS — Z79899 Other long term (current) drug therapy: Secondary | ICD-10-CM | POA: Diagnosis not present

## 2023-04-17 DIAGNOSIS — K5730 Diverticulosis of large intestine without perforation or abscess without bleeding: Secondary | ICD-10-CM | POA: Diagnosis not present

## 2023-04-17 DIAGNOSIS — Z951 Presence of aortocoronary bypass graft: Secondary | ICD-10-CM | POA: Diagnosis not present

## 2023-04-17 DIAGNOSIS — R5383 Other fatigue: Secondary | ICD-10-CM | POA: Diagnosis not present

## 2023-04-17 DIAGNOSIS — Z6825 Body mass index (BMI) 25.0-25.9, adult: Secondary | ICD-10-CM | POA: Diagnosis not present

## 2023-04-17 DIAGNOSIS — D539 Nutritional anemia, unspecified: Secondary | ICD-10-CM | POA: Diagnosis not present

## 2023-04-17 DIAGNOSIS — B370 Candidal stomatitis: Secondary | ICD-10-CM | POA: Diagnosis not present

## 2023-04-17 DIAGNOSIS — K5909 Other constipation: Secondary | ICD-10-CM | POA: Diagnosis not present

## 2023-04-17 DIAGNOSIS — R531 Weakness: Secondary | ICD-10-CM | POA: Diagnosis not present

## 2023-04-17 DIAGNOSIS — N390 Urinary tract infection, site not specified: Secondary | ICD-10-CM | POA: Diagnosis not present

## 2023-04-17 DIAGNOSIS — N2889 Other specified disorders of kidney and ureter: Secondary | ICD-10-CM | POA: Diagnosis not present

## 2023-04-17 DIAGNOSIS — I2510 Atherosclerotic heart disease of native coronary artery without angina pectoris: Secondary | ICD-10-CM | POA: Diagnosis not present

## 2023-04-17 DIAGNOSIS — R161 Splenomegaly, not elsewhere classified: Secondary | ICD-10-CM | POA: Diagnosis not present

## 2023-04-17 DIAGNOSIS — R339 Retention of urine, unspecified: Secondary | ICD-10-CM | POA: Diagnosis not present

## 2023-04-17 DIAGNOSIS — Z87440 Personal history of urinary (tract) infections: Secondary | ICD-10-CM | POA: Diagnosis not present

## 2023-04-17 DIAGNOSIS — Z8673 Personal history of transient ischemic attack (TIA), and cerebral infarction without residual deficits: Secondary | ICD-10-CM | POA: Diagnosis not present

## 2023-04-17 DIAGNOSIS — I10 Essential (primary) hypertension: Secondary | ICD-10-CM | POA: Diagnosis not present

## 2023-04-17 DIAGNOSIS — D471 Chronic myeloproliferative disease: Secondary | ICD-10-CM | POA: Diagnosis not present

## 2023-04-17 DIAGNOSIS — Z8719 Personal history of other diseases of the digestive system: Secondary | ICD-10-CM | POA: Diagnosis not present

## 2023-04-17 DIAGNOSIS — R509 Fever, unspecified: Secondary | ICD-10-CM | POA: Diagnosis not present

## 2023-04-17 DIAGNOSIS — R9431 Abnormal electrocardiogram [ECG] [EKG]: Secondary | ICD-10-CM | POA: Diagnosis not present

## 2023-04-17 DIAGNOSIS — B9620 Unspecified Escherichia coli [E. coli] as the cause of diseases classified elsewhere: Secondary | ICD-10-CM | POA: Diagnosis not present

## 2023-04-17 DIAGNOSIS — I498 Other specified cardiac arrhythmias: Secondary | ICD-10-CM | POA: Diagnosis not present

## 2023-04-17 DIAGNOSIS — J449 Chronic obstructive pulmonary disease, unspecified: Secondary | ICD-10-CM | POA: Diagnosis not present

## 2023-04-17 DIAGNOSIS — Z7902 Long term (current) use of antithrombotics/antiplatelets: Secondary | ICD-10-CM | POA: Diagnosis not present

## 2023-04-19 DIAGNOSIS — R161 Splenomegaly, not elsewhere classified: Secondary | ICD-10-CM | POA: Diagnosis not present

## 2023-04-19 DIAGNOSIS — K5730 Diverticulosis of large intestine without perforation or abscess without bleeding: Secondary | ICD-10-CM | POA: Diagnosis not present

## 2023-04-19 DIAGNOSIS — N2889 Other specified disorders of kidney and ureter: Secondary | ICD-10-CM | POA: Diagnosis not present

## 2023-04-23 DIAGNOSIS — M47819 Spondylosis without myelopathy or radiculopathy, site unspecified: Secondary | ICD-10-CM | POA: Diagnosis not present

## 2023-04-23 DIAGNOSIS — H5462 Unqualified visual loss, left eye, normal vision right eye: Secondary | ICD-10-CM | POA: Diagnosis not present

## 2023-04-23 DIAGNOSIS — I69354 Hemiplegia and hemiparesis following cerebral infarction affecting left non-dominant side: Secondary | ICD-10-CM | POA: Diagnosis not present

## 2023-04-23 DIAGNOSIS — D473 Essential (hemorrhagic) thrombocythemia: Secondary | ICD-10-CM | POA: Diagnosis not present

## 2023-04-23 DIAGNOSIS — K5730 Diverticulosis of large intestine without perforation or abscess without bleeding: Secondary | ICD-10-CM | POA: Diagnosis not present

## 2023-04-23 DIAGNOSIS — I700 Atherosclerosis of aorta: Secondary | ICD-10-CM | POA: Diagnosis not present

## 2023-04-23 DIAGNOSIS — N3281 Overactive bladder: Secondary | ICD-10-CM | POA: Diagnosis not present

## 2023-04-23 DIAGNOSIS — R161 Splenomegaly, not elsewhere classified: Secondary | ICD-10-CM | POA: Diagnosis not present

## 2023-04-23 DIAGNOSIS — N390 Urinary tract infection, site not specified: Secondary | ICD-10-CM | POA: Diagnosis not present

## 2023-04-23 DIAGNOSIS — I119 Hypertensive heart disease without heart failure: Secondary | ICD-10-CM | POA: Diagnosis not present

## 2023-04-23 DIAGNOSIS — M4800 Spinal stenosis, site unspecified: Secondary | ICD-10-CM | POA: Diagnosis not present

## 2023-04-23 DIAGNOSIS — M150 Primary generalized (osteo)arthritis: Secondary | ICD-10-CM | POA: Diagnosis not present

## 2023-04-23 DIAGNOSIS — G4733 Obstructive sleep apnea (adult) (pediatric): Secondary | ICD-10-CM | POA: Diagnosis not present

## 2023-04-23 DIAGNOSIS — E538 Deficiency of other specified B group vitamins: Secondary | ICD-10-CM | POA: Diagnosis not present

## 2023-04-23 DIAGNOSIS — J4489 Other specified chronic obstructive pulmonary disease: Secondary | ICD-10-CM | POA: Diagnosis not present

## 2023-04-23 DIAGNOSIS — N289 Disorder of kidney and ureter, unspecified: Secondary | ICD-10-CM | POA: Diagnosis not present

## 2023-04-23 DIAGNOSIS — D471 Chronic myeloproliferative disease: Secondary | ICD-10-CM | POA: Diagnosis not present

## 2023-04-23 DIAGNOSIS — M7071 Other bursitis of hip, right hip: Secondary | ICD-10-CM | POA: Diagnosis not present

## 2023-04-23 DIAGNOSIS — E782 Mixed hyperlipidemia: Secondary | ICD-10-CM | POA: Diagnosis not present

## 2023-04-23 DIAGNOSIS — I69398 Other sequelae of cerebral infarction: Secondary | ICD-10-CM | POA: Diagnosis not present

## 2023-04-23 DIAGNOSIS — D539 Nutritional anemia, unspecified: Secondary | ICD-10-CM | POA: Diagnosis not present

## 2023-04-23 DIAGNOSIS — I7090 Unspecified atherosclerosis: Secondary | ICD-10-CM | POA: Diagnosis not present

## 2023-04-23 DIAGNOSIS — I2510 Atherosclerotic heart disease of native coronary artery without angina pectoris: Secondary | ICD-10-CM | POA: Diagnosis not present

## 2023-04-23 DIAGNOSIS — R911 Solitary pulmonary nodule: Secondary | ICD-10-CM | POA: Diagnosis not present

## 2023-04-25 DIAGNOSIS — I509 Heart failure, unspecified: Secondary | ICD-10-CM | POA: Diagnosis not present

## 2023-04-25 DIAGNOSIS — J449 Chronic obstructive pulmonary disease, unspecified: Secondary | ICD-10-CM | POA: Diagnosis not present

## 2023-04-25 DIAGNOSIS — M1990 Unspecified osteoarthritis, unspecified site: Secondary | ICD-10-CM | POA: Diagnosis not present

## 2023-04-25 DIAGNOSIS — D72825 Bandemia: Secondary | ICD-10-CM | POA: Diagnosis not present

## 2023-04-25 DIAGNOSIS — Z8673 Personal history of transient ischemic attack (TIA), and cerebral infarction without residual deficits: Secondary | ICD-10-CM | POA: Diagnosis not present

## 2023-04-25 DIAGNOSIS — G4733 Obstructive sleep apnea (adult) (pediatric): Secondary | ICD-10-CM | POA: Diagnosis not present

## 2023-04-25 DIAGNOSIS — B3731 Acute candidiasis of vulva and vagina: Secondary | ICD-10-CM | POA: Diagnosis not present

## 2023-04-25 DIAGNOSIS — Z7982 Long term (current) use of aspirin: Secondary | ICD-10-CM | POA: Diagnosis not present

## 2023-04-25 DIAGNOSIS — E538 Deficiency of other specified B group vitamins: Secondary | ICD-10-CM | POA: Diagnosis not present

## 2023-04-25 DIAGNOSIS — R9431 Abnormal electrocardiogram [ECG] [EKG]: Secondary | ICD-10-CM | POA: Diagnosis not present

## 2023-04-25 DIAGNOSIS — R630 Anorexia: Secondary | ICD-10-CM | POA: Diagnosis not present

## 2023-04-25 DIAGNOSIS — E785 Hyperlipidemia, unspecified: Secondary | ICD-10-CM | POA: Diagnosis not present

## 2023-04-25 DIAGNOSIS — Z8616 Personal history of COVID-19: Secondary | ICD-10-CM | POA: Diagnosis not present

## 2023-04-25 DIAGNOSIS — Z951 Presence of aortocoronary bypass graft: Secondary | ICD-10-CM | POA: Diagnosis not present

## 2023-04-25 DIAGNOSIS — Z89412 Acquired absence of left great toe: Secondary | ICD-10-CM | POA: Diagnosis not present

## 2023-04-25 DIAGNOSIS — Z79899 Other long term (current) drug therapy: Secondary | ICD-10-CM | POA: Diagnosis not present

## 2023-04-25 DIAGNOSIS — R001 Bradycardia, unspecified: Secondary | ICD-10-CM | POA: Diagnosis not present

## 2023-04-25 DIAGNOSIS — R7989 Other specified abnormal findings of blood chemistry: Secondary | ICD-10-CM | POA: Diagnosis not present

## 2023-04-25 DIAGNOSIS — B370 Candidal stomatitis: Secondary | ICD-10-CM | POA: Diagnosis not present

## 2023-04-25 DIAGNOSIS — G4700 Insomnia, unspecified: Secondary | ICD-10-CM | POA: Diagnosis not present

## 2023-04-25 DIAGNOSIS — R531 Weakness: Secondary | ICD-10-CM | POA: Diagnosis not present

## 2023-04-25 DIAGNOSIS — I10 Essential (primary) hypertension: Secondary | ICD-10-CM | POA: Diagnosis not present

## 2023-04-25 DIAGNOSIS — K5900 Constipation, unspecified: Secondary | ICD-10-CM | POA: Diagnosis not present

## 2023-04-25 DIAGNOSIS — R112 Nausea with vomiting, unspecified: Secondary | ICD-10-CM | POA: Diagnosis not present

## 2023-04-25 DIAGNOSIS — N390 Urinary tract infection, site not specified: Secondary | ICD-10-CM | POA: Diagnosis not present

## 2023-04-25 DIAGNOSIS — R7401 Elevation of levels of liver transaminase levels: Secondary | ICD-10-CM | POA: Diagnosis not present

## 2023-04-25 DIAGNOSIS — Z6823 Body mass index (BMI) 23.0-23.9, adult: Secondary | ICD-10-CM | POA: Diagnosis not present

## 2023-04-25 DIAGNOSIS — E559 Vitamin D deficiency, unspecified: Secondary | ICD-10-CM | POA: Diagnosis not present

## 2023-04-25 DIAGNOSIS — D471 Chronic myeloproliferative disease: Secondary | ICD-10-CM | POA: Diagnosis not present

## 2023-04-25 DIAGNOSIS — I2510 Atherosclerotic heart disease of native coronary artery without angina pectoris: Secondary | ICD-10-CM | POA: Diagnosis not present

## 2023-04-25 DIAGNOSIS — R911 Solitary pulmonary nodule: Secondary | ICD-10-CM | POA: Diagnosis not present

## 2023-04-25 DIAGNOSIS — Z87440 Personal history of urinary (tract) infections: Secondary | ICD-10-CM | POA: Diagnosis not present

## 2023-04-25 DIAGNOSIS — R5381 Other malaise: Secondary | ICD-10-CM | POA: Diagnosis not present

## 2023-04-25 DIAGNOSIS — K219 Gastro-esophageal reflux disease without esophagitis: Secondary | ICD-10-CM | POA: Diagnosis not present

## 2023-04-25 DIAGNOSIS — Z85828 Personal history of other malignant neoplasm of skin: Secondary | ICD-10-CM | POA: Diagnosis not present

## 2023-04-30 DIAGNOSIS — G4733 Obstructive sleep apnea (adult) (pediatric): Secondary | ICD-10-CM | POA: Diagnosis not present

## 2023-04-30 DIAGNOSIS — M6281 Muscle weakness (generalized): Secondary | ICD-10-CM | POA: Diagnosis not present

## 2023-04-30 DIAGNOSIS — I509 Heart failure, unspecified: Secondary | ICD-10-CM | POA: Diagnosis not present

## 2023-04-30 DIAGNOSIS — Z8616 Personal history of COVID-19: Secondary | ICD-10-CM | POA: Diagnosis not present

## 2023-04-30 DIAGNOSIS — E538 Deficiency of other specified B group vitamins: Secondary | ICD-10-CM | POA: Diagnosis not present

## 2023-04-30 DIAGNOSIS — R911 Solitary pulmonary nodule: Secondary | ICD-10-CM | POA: Diagnosis not present

## 2023-04-30 DIAGNOSIS — R531 Weakness: Secondary | ICD-10-CM | POA: Diagnosis not present

## 2023-04-30 DIAGNOSIS — R748 Abnormal levels of other serum enzymes: Secondary | ICD-10-CM | POA: Diagnosis not present

## 2023-04-30 DIAGNOSIS — K5900 Constipation, unspecified: Secondary | ICD-10-CM | POA: Diagnosis not present

## 2023-04-30 DIAGNOSIS — B370 Candidal stomatitis: Secondary | ICD-10-CM | POA: Diagnosis not present

## 2023-04-30 DIAGNOSIS — E559 Vitamin D deficiency, unspecified: Secondary | ICD-10-CM | POA: Diagnosis not present

## 2023-04-30 DIAGNOSIS — D471 Chronic myeloproliferative disease: Secondary | ICD-10-CM | POA: Diagnosis not present

## 2023-04-30 DIAGNOSIS — I2510 Atherosclerotic heart disease of native coronary artery without angina pectoris: Secondary | ICD-10-CM | POA: Diagnosis not present

## 2023-04-30 DIAGNOSIS — J449 Chronic obstructive pulmonary disease, unspecified: Secondary | ICD-10-CM | POA: Diagnosis not present

## 2023-04-30 DIAGNOSIS — I10 Essential (primary) hypertension: Secondary | ICD-10-CM | POA: Diagnosis not present

## 2023-04-30 DIAGNOSIS — N390 Urinary tract infection, site not specified: Secondary | ICD-10-CM | POA: Diagnosis not present

## 2023-04-30 DIAGNOSIS — Z8673 Personal history of transient ischemic attack (TIA), and cerebral infarction without residual deficits: Secondary | ICD-10-CM | POA: Diagnosis not present

## 2023-04-30 DIAGNOSIS — I639 Cerebral infarction, unspecified: Secondary | ICD-10-CM | POA: Diagnosis not present

## 2023-04-30 DIAGNOSIS — E785 Hyperlipidemia, unspecified: Secondary | ICD-10-CM | POA: Diagnosis not present

## 2023-04-30 DIAGNOSIS — R112 Nausea with vomiting, unspecified: Secondary | ICD-10-CM | POA: Diagnosis not present

## 2023-04-30 DIAGNOSIS — K219 Gastro-esophageal reflux disease without esophagitis: Secondary | ICD-10-CM | POA: Diagnosis not present

## 2023-04-30 DIAGNOSIS — R5381 Other malaise: Secondary | ICD-10-CM | POA: Diagnosis not present

## 2023-04-30 DIAGNOSIS — Z951 Presence of aortocoronary bypass graft: Secondary | ICD-10-CM | POA: Diagnosis not present

## 2023-05-02 DIAGNOSIS — M6281 Muscle weakness (generalized): Secondary | ICD-10-CM | POA: Diagnosis not present

## 2023-05-02 DIAGNOSIS — J449 Chronic obstructive pulmonary disease, unspecified: Secondary | ICD-10-CM | POA: Diagnosis not present

## 2023-05-02 DIAGNOSIS — I509 Heart failure, unspecified: Secondary | ICD-10-CM | POA: Diagnosis not present

## 2023-05-02 DIAGNOSIS — I2510 Atherosclerotic heart disease of native coronary artery without angina pectoris: Secondary | ICD-10-CM | POA: Diagnosis not present

## 2023-05-02 DIAGNOSIS — R748 Abnormal levels of other serum enzymes: Secondary | ICD-10-CM | POA: Diagnosis not present

## 2023-05-02 DIAGNOSIS — I10 Essential (primary) hypertension: Secondary | ICD-10-CM | POA: Diagnosis not present

## 2023-05-02 DIAGNOSIS — R112 Nausea with vomiting, unspecified: Secondary | ICD-10-CM | POA: Diagnosis not present

## 2023-05-05 ENCOUNTER — Ambulatory Visit (INDEPENDENT_AMBULATORY_CARE_PROVIDER_SITE_OTHER): Payer: PPO

## 2023-05-05 DIAGNOSIS — I639 Cerebral infarction, unspecified: Secondary | ICD-10-CM

## 2023-05-06 LAB — CUP PACEART REMOTE DEVICE CHECK
Date Time Interrogation Session: 20240913230951
Implantable Pulse Generator Implant Date: 20210413

## 2023-05-21 NOTE — Progress Notes (Signed)
Carelink Summary Report / Loop Recorder 

## 2023-05-24 DIAGNOSIS — B9620 Unspecified Escherichia coli [E. coli] as the cause of diseases classified elsewhere: Secondary | ICD-10-CM | POA: Diagnosis not present

## 2023-05-24 DIAGNOSIS — I509 Heart failure, unspecified: Secondary | ICD-10-CM | POA: Diagnosis not present

## 2023-05-24 DIAGNOSIS — Z95818 Presence of other cardiac implants and grafts: Secondary | ICD-10-CM | POA: Diagnosis not present

## 2023-05-24 DIAGNOSIS — Z951 Presence of aortocoronary bypass graft: Secondary | ICD-10-CM | POA: Diagnosis not present

## 2023-05-24 DIAGNOSIS — K219 Gastro-esophageal reflux disease without esophagitis: Secondary | ICD-10-CM | POA: Diagnosis not present

## 2023-05-24 DIAGNOSIS — N390 Urinary tract infection, site not specified: Secondary | ICD-10-CM | POA: Diagnosis not present

## 2023-05-24 DIAGNOSIS — R911 Solitary pulmonary nodule: Secondary | ICD-10-CM | POA: Diagnosis not present

## 2023-05-24 DIAGNOSIS — J449 Chronic obstructive pulmonary disease, unspecified: Secondary | ICD-10-CM | POA: Diagnosis not present

## 2023-05-24 DIAGNOSIS — Z8616 Personal history of COVID-19: Secondary | ICD-10-CM | POA: Diagnosis not present

## 2023-05-24 DIAGNOSIS — I110 Hypertensive heart disease with heart failure: Secondary | ICD-10-CM | POA: Diagnosis not present

## 2023-05-24 DIAGNOSIS — Z85828 Personal history of other malignant neoplasm of skin: Secondary | ICD-10-CM | POA: Diagnosis not present

## 2023-05-24 DIAGNOSIS — E538 Deficiency of other specified B group vitamins: Secondary | ICD-10-CM | POA: Diagnosis not present

## 2023-05-24 DIAGNOSIS — E785 Hyperlipidemia, unspecified: Secondary | ICD-10-CM | POA: Diagnosis not present

## 2023-05-24 DIAGNOSIS — Z9181 History of falling: Secondary | ICD-10-CM | POA: Diagnosis not present

## 2023-05-24 DIAGNOSIS — I2510 Atherosclerotic heart disease of native coronary artery without angina pectoris: Secondary | ICD-10-CM | POA: Diagnosis not present

## 2023-05-24 DIAGNOSIS — G4733 Obstructive sleep apnea (adult) (pediatric): Secondary | ICD-10-CM | POA: Diagnosis not present

## 2023-05-24 DIAGNOSIS — Z7982 Long term (current) use of aspirin: Secondary | ICD-10-CM | POA: Diagnosis not present

## 2023-05-24 DIAGNOSIS — E559 Vitamin D deficiency, unspecified: Secondary | ICD-10-CM | POA: Diagnosis not present

## 2023-05-24 DIAGNOSIS — Z79899 Other long term (current) drug therapy: Secondary | ICD-10-CM | POA: Diagnosis not present

## 2023-05-24 DIAGNOSIS — Z8673 Personal history of transient ischemic attack (TIA), and cerebral infarction without residual deficits: Secondary | ICD-10-CM | POA: Diagnosis not present

## 2023-05-27 DIAGNOSIS — E785 Hyperlipidemia, unspecified: Secondary | ICD-10-CM | POA: Diagnosis not present

## 2023-05-27 DIAGNOSIS — Z8673 Personal history of transient ischemic attack (TIA), and cerebral infarction without residual deficits: Secondary | ICD-10-CM | POA: Diagnosis not present

## 2023-05-27 DIAGNOSIS — H539 Unspecified visual disturbance: Secondary | ICD-10-CM | POA: Diagnosis not present

## 2023-05-27 DIAGNOSIS — Z9181 History of falling: Secondary | ICD-10-CM | POA: Diagnosis not present

## 2023-05-27 DIAGNOSIS — Z89419 Acquired absence of unspecified great toe: Secondary | ICD-10-CM | POA: Diagnosis not present

## 2023-05-27 DIAGNOSIS — M19012 Primary osteoarthritis, left shoulder: Secondary | ICD-10-CM | POA: Diagnosis not present

## 2023-05-27 DIAGNOSIS — M159 Polyosteoarthritis, unspecified: Secondary | ICD-10-CM | POA: Diagnosis not present

## 2023-05-27 DIAGNOSIS — Z23 Encounter for immunization: Secondary | ICD-10-CM | POA: Diagnosis not present

## 2023-05-27 DIAGNOSIS — H6123 Impacted cerumen, bilateral: Secondary | ICD-10-CM | POA: Diagnosis not present

## 2023-05-27 DIAGNOSIS — K219 Gastro-esophageal reflux disease without esophagitis: Secondary | ICD-10-CM | POA: Diagnosis not present

## 2023-05-27 DIAGNOSIS — I10 Essential (primary) hypertension: Secondary | ICD-10-CM | POA: Diagnosis not present

## 2023-05-27 DIAGNOSIS — Z1331 Encounter for screening for depression: Secondary | ICD-10-CM | POA: Diagnosis not present

## 2023-06-04 ENCOUNTER — Ambulatory Visit: Payer: PPO | Admitting: Oncology

## 2023-06-04 ENCOUNTER — Other Ambulatory Visit: Payer: PPO

## 2023-06-09 ENCOUNTER — Ambulatory Visit: Payer: PPO

## 2023-06-09 DIAGNOSIS — I639 Cerebral infarction, unspecified: Secondary | ICD-10-CM | POA: Diagnosis not present

## 2023-06-10 LAB — CUP PACEART REMOTE DEVICE CHECK
Date Time Interrogation Session: 20241020230304
Implantable Pulse Generator Implant Date: 20210413

## 2023-06-12 DIAGNOSIS — M159 Polyosteoarthritis, unspecified: Secondary | ICD-10-CM | POA: Diagnosis not present

## 2023-06-12 DIAGNOSIS — K219 Gastro-esophageal reflux disease without esophagitis: Secondary | ICD-10-CM | POA: Diagnosis not present

## 2023-06-12 DIAGNOSIS — H539 Unspecified visual disturbance: Secondary | ICD-10-CM | POA: Diagnosis not present

## 2023-06-12 DIAGNOSIS — I700 Atherosclerosis of aorta: Secondary | ICD-10-CM | POA: Diagnosis not present

## 2023-06-12 DIAGNOSIS — Z79899 Other long term (current) drug therapy: Secondary | ICD-10-CM | POA: Diagnosis not present

## 2023-06-12 DIAGNOSIS — Z9181 History of falling: Secondary | ICD-10-CM | POA: Diagnosis not present

## 2023-06-12 DIAGNOSIS — E785 Hyperlipidemia, unspecified: Secondary | ICD-10-CM | POA: Diagnosis not present

## 2023-06-12 DIAGNOSIS — E559 Vitamin D deficiency, unspecified: Secondary | ICD-10-CM | POA: Diagnosis not present

## 2023-06-12 DIAGNOSIS — Z89419 Acquired absence of unspecified great toe: Secondary | ICD-10-CM | POA: Diagnosis not present

## 2023-06-12 DIAGNOSIS — E538 Deficiency of other specified B group vitamins: Secondary | ICD-10-CM | POA: Diagnosis not present

## 2023-06-12 DIAGNOSIS — Z8673 Personal history of transient ischemic attack (TIA), and cerebral infarction without residual deficits: Secondary | ICD-10-CM | POA: Diagnosis not present

## 2023-06-12 DIAGNOSIS — I10 Essential (primary) hypertension: Secondary | ICD-10-CM | POA: Diagnosis not present

## 2023-06-12 DIAGNOSIS — Z8719 Personal history of other diseases of the digestive system: Secondary | ICD-10-CM | POA: Diagnosis not present

## 2023-06-12 DIAGNOSIS — J449 Chronic obstructive pulmonary disease, unspecified: Secondary | ICD-10-CM | POA: Diagnosis not present

## 2023-06-12 DIAGNOSIS — M19012 Primary osteoarthritis, left shoulder: Secondary | ICD-10-CM | POA: Diagnosis not present

## 2023-06-17 ENCOUNTER — Inpatient Hospital Stay: Payer: PPO | Attending: Oncology

## 2023-06-17 ENCOUNTER — Inpatient Hospital Stay: Payer: PPO | Admitting: Oncology

## 2023-06-17 ENCOUNTER — Encounter: Payer: Self-pay | Admitting: Oncology

## 2023-06-17 ENCOUNTER — Other Ambulatory Visit: Payer: Self-pay | Admitting: Oncology

## 2023-06-17 VITALS — BP 114/74 | HR 66 | Temp 98.4°F | Resp 18 | Ht 65.0 in | Wt 138.9 lb

## 2023-06-17 DIAGNOSIS — D473 Essential (hemorrhagic) thrombocythemia: Secondary | ICD-10-CM | POA: Diagnosis not present

## 2023-06-17 DIAGNOSIS — C4370 Malignant melanoma of unspecified lower limb, including hip: Secondary | ICD-10-CM

## 2023-06-17 DIAGNOSIS — D751 Secondary polycythemia: Secondary | ICD-10-CM | POA: Diagnosis not present

## 2023-06-17 LAB — CBC WITH DIFFERENTIAL (CANCER CENTER ONLY)
Abs Immature Granulocytes: 0.73 10*3/uL — ABNORMAL HIGH (ref 0.00–0.07)
Basophils Absolute: 0.2 10*3/uL — ABNORMAL HIGH (ref 0.0–0.1)
Basophils Relative: 1 %
Eosinophils Absolute: 0.1 10*3/uL (ref 0.0–0.5)
Eosinophils Relative: 1 %
HCT: 32.8 % — ABNORMAL LOW (ref 36.0–46.0)
Hemoglobin: 10.1 g/dL — ABNORMAL LOW (ref 12.0–15.0)
Immature Granulocytes: 4 %
Lymphocytes Relative: 14 %
Lymphs Abs: 2.3 10*3/uL (ref 0.7–4.0)
MCH: 32.5 pg (ref 26.0–34.0)
MCHC: 30.8 g/dL (ref 30.0–36.0)
MCV: 105.5 fL — ABNORMAL HIGH (ref 80.0–100.0)
Monocytes Absolute: 1.6 10*3/uL — ABNORMAL HIGH (ref 0.1–1.0)
Monocytes Relative: 10 %
Neutro Abs: 12.1 10*3/uL — ABNORMAL HIGH (ref 1.7–7.7)
Neutrophils Relative %: 70 %
Platelet Count: 143 10*3/uL — ABNORMAL LOW (ref 150–400)
RBC: 3.11 MIL/uL — ABNORMAL LOW (ref 3.87–5.11)
RDW: 18.2 % — ABNORMAL HIGH (ref 11.5–15.5)
WBC Count: 17.1 10*3/uL — ABNORMAL HIGH (ref 4.0–10.5)
nRBC: 0.2 % (ref 0.0–0.2)

## 2023-06-17 LAB — CMP (CANCER CENTER ONLY)
ALT: 16 U/L (ref 0–44)
AST: 18 U/L (ref 15–41)
Albumin: 3.9 g/dL (ref 3.5–5.0)
Alkaline Phosphatase: 47 U/L (ref 38–126)
Anion gap: 11 (ref 5–15)
BUN: 18 mg/dL (ref 8–23)
CO2: 23 mmol/L (ref 22–32)
Calcium: 8.8 mg/dL — ABNORMAL LOW (ref 8.9–10.3)
Chloride: 104 mmol/L (ref 98–111)
Creatinine: 0.71 mg/dL (ref 0.44–1.00)
GFR, Estimated: >60 mL/min (ref >60)
Glucose, Bld: 106 mg/dL — ABNORMAL HIGH (ref 70–99)
Potassium: 3.7 mmol/L (ref 3.5–5.1)
Sodium: 138 mmol/L (ref 135–145)
Total Bilirubin: 0.9 mg/dL (ref 0.3–1.2)
Total Protein: 7.4 g/dL (ref 6.5–8.1)

## 2023-06-17 NOTE — Progress Notes (Signed)
[facility]  [address] [place],  [place]  [address] [phone]  Clinic Day: 06/17/23  Referring physician: Lucianne Lei, MD  ASSESSMENT & PLAN:  Assessment & Plan: Essential thrombocythemia [facility]) Essential thrombocythemia previously treated with hydroxyurea.  Due to low normal platelets, hydroxyurea was discontinued in October 2021.  She has persistent leukocytosis/neutrophilia, which she has had on and off since 2018.  This is felt to be due to her myeloproliferative disease.  Her counts remain stable, so we recommend continued observation.  We are seeing metamyelocytes and myelocytes but her blood counts remain relatively stable. We will not pursue that since she is too frail to consider any more aggressive treatment.  Recurrent urinary tract infections She has persistent symptoms of UTI despite recent treatment with nitrofurantoin.  We will obtain urinalysis and urine culture today and plan to contact Dr. Hope Pigeon with the results.  I encouraged the patient to try the Azo-pyridium over the counter and at least some of the UTI prevention recommendations.  Multiple Skin Cancers She has even had malignant melanoma and melanoma in situ. She continues to see the dermatologist regularly.   History of Vulvar Cancer This has been treated and she has no evidence of disease.   TIA/Recurrent Strokes She knows to stay on Aspirin and would probably benefit from more anticoagulation, but I feel this is risky in view of her frequent falls.   Plan: She is aching today due to a fall that occurred this weekend after her last physical therapy and she injured her left wrist and left hip. I recommended further physical therapy but she does not recall which agency. She feels the fall was caused by small strokes. She continues to take aspirin 180 mg daily and took a extra one that day. She has had severe worsening hearing loss and has a hearing aid of the left ear.  Dr. Mathis Bud has tried to address this with lavage and will follow-up, I think she does need to see a audiologist as well. She admits that she has not been drinking much fluids and her last BUN and creatinine were elevated. I recommended she increase her fluids. She is scheduled to see Dr. 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 She has an additional lesion in the right superior periorbital area that will also be addressed. Her blood counts last time were relatively stable, she does have some increase in immature granulocytes and could be evolving into myelofibrosis or AML. We will not pursue that since she is too frail to consider any more aggressive treatment and her blood counts have been stable. Her labs today are pending. I will see her back in 3 months with CBC and CMP. The patient understands the plans discussed today and is in agreement with them. They know to contact our office if she develops concerns prior to her next appointment.  I provided 30 minutes of face-to-face time during this encounter and > 50% was spent counseling as documented under my assessment and plan.    Dellia Beckwith, MD  [facility] [address] [place] [address] Dept: [phone] Dept Fax: [phone]   No orders of the defined types were placed in this encounter.   CHIEF COMPLAINT:  CC: Essential thrombocythemia  Current Treatment: Surveillance  HISTORY OF PRESENT ILLNESS:  Yolanda Flowers is a 82 year old female with a history of essential thrombocythemia originally diagnosed  in 1992.  She was treated with hydroxyurea 500 mg 3 times daily.  We had to occasionally adjust doses because of leukopenia or anemia.  She also had a malignant melanoma of her left first toe resected in November 2015 with partial amputation of the toe and has done well.  She has had multiple other skin cancers removed over the  years.  In March 2017, she was seen by Dr. Andrey Farmer for HSIL/VIN 3 of the right posterior labia minora and underwent vulvectomy.  Hydroxyurea was decreased to 500 mg twice daily prior to surgery, as we did not feel we could safely hold hydroxyurea.  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  Bone density scan in March 2018 revealed osteopenia with a T-score of -1.6 in the femur, for which she is on calcium and vitamin-D.  She has had chronic back pain, which is attributed to severe degenerative disease.  She had an MRI of the lumbar spine in March 2018, which revealed significant degenerative disc disease with mild spinal stenosis at L1 and L4, but moderate at L2-3.  Dr. Ollen Bowl has given her injections, as well at hydrocodone/APAP 5/325.  She has had intermittent leukocytosis since 2018, felt to be secondary to her essential thrombocythemia. She was evaluated in the emergency department in October 2019 and had a GI bleed. CT abdomen and pelvis revealed acute uncomplicated diverticulitis of the sigmoid colon with no other acute abnormalities.  A moderate to large hiatal hernia was seen. She saw Dr. Tomie China and underwent nuclear med stress test in November, which did not reveal any evidence of inducible ischemia or other abnormality.  Left ventricular size and function was normal with an ejection fraction of 79%.  She underwent EGD and colonoscopy in December 2019, and no significant source of blood loss was identified. She was instructed to continue lansoprazole.  She had dilatation of an esophageal stricture and removal of a polyp. Pathology revealed a fragmented tubular adenoma.    In January 2020, she had worsening anemia, in addition to continued episodes of chest pain and worsening dyspnea with exertion at her routine follow-up. Due to the severe dyspnea and right lower extremity edema, we obtained a CT angiogram  chest, which did not reveal any evidence of pulmonary embolism.  There was good opacification of the pulmonary arteries.  Diffuse coronary artery calcifications with cardiomegaly and moderate thoracic aortic atherosclerosis was seen.  There was a 3.6 mm noncalcified nodule in the anterior inferior right upper lobe.  Non-contrasted CT chest in 6-12 months was recommended.  Right lower extremity venous Doppler ultrasound did not reveal any evidence of deep venous thrombosis. In February 2020, she had persistent anemia with a hemoglobin of 9.7. As she was symptomatic, hydroxyurea 500 mg was decreased to once daily.  Soluble transferrin was elevated, which was consistent with iron deficiency.  She saw Dr. Jennye Boroughs and he placed her on iron supplementation daily in the form of ferrous sulfate 65 mg. She was seen again in March and had improvement in her anemia with a hemoglobin of 10.2.  Her thrombocythemia remained controlled on hydroxyurea 500 mg daily. We encouraged her to have follow-up for her vulvar cancer, so followed up with Dr. Alison Murray and her exam was normal.     She had a bilateral screening mammogram in February 2021, which revealed an area of calcifications in the right breast warranting further evaluation.  A diagnostic right mammogram was ordered,  but this was delayed, because she underwent open heart surgery with CABG x4 in March.  She had a monitor placed in the left chest wall the time of surgery.  She did not feel she can have mammogram due to the monitor in the left chest wall.  She then had 2 strokes following her surgery, from which she has recovered well, except for balance issues and left eye blindness.  She ambulates with a cane or walker depending on her circumstances.  She is on clopidogrel 75 mg daily, atorvastatin 80 mg daily, metoprolol 25 mg twice daily and pantoprazole 40 mg daily. She had a CT chest in July to follow-up on the pulmonary nodule which were stable. She finally  underwent diagnostic right mammogram from July which revealed likely benign 6 mm group of calcifications involving the upper inner quadrant at middle depth. Bilateral diagnostic mammogram in February 2022 was recommended  At her visit in October 2021, she reported elevated transaminases and purpura, which was attributed to atorvastatin.  Dr. Tomie China reduced the dose from 80 mg to 40 mg with normalization of the transaminases and no further purpura.  Her platelets were in low normal range, so hydroxyurea was held and since her platelets have remained normal, hydroxyurea was discontinued. She has had intermittent mild leukocytosis/neutrophilia since 2018.  She has occasionally had immature cells on her blood smear, but not blasts.    When I saw her in May 2022, she had worsening leukocytosis with persistent immature cells and worsening anemia, so I recommended a bone marrow biopsy, but this was never done as the patient was hospitalized with a stroke and discharged to rehab. She was seen again in August, at which time her white count had decreased from 24.2 to 19.9 with an ANC of 1473, with 11% lymphocytes, 10% monocytes and 2.4 % basophils and her hemoglobin had improved from 10.6 to 11.3. The platelet count remained normal, so continued observation was recommended.  Her blood counts were stable in November.   She was admitted in January 2023 with a 3 day history of sensation of an object stuck in her throat following attempted swallowing of calcium supplementation. She was eventually able to dislodge item after several hours.  She then developed stool that she described as "motor oil" for 2 days. She was evaluated in the emergency room and was found to have hemoglobin of 8.3, decreased from 11.4 in November 2022.   She underwent urgent EGD and was found to have a 3 cm mucosal tear within the distal 5 cm of her esophagus, felt to be most likely secondary to mucosal tear following calcium carbonate impaction. CT  neck and chest was performed to evaluate for possibility of mediastinitis developing following possible full focus mucosal tear. This imaging did not reveal active inflammatory changes. 3 clips were placed closing the tear successfully and complete hemostasis was achieved.  She was placed on twice daily pantoprazole with slow advancement of diet from clear liquid to house select over 2 days.  She had an acute drop in hemoglobin to 6.9 following further equilibration, so received 1 unit PRBC with appropriate response to 8.2 g/dL. Her hemoglobin remained at approximately 8 for the remainder of admission.  She was instructed to take iron supplementation while at home and to avoid calcium carbonate for time being and to transition to over-the-counter chewable supplementation.  Iron/TIBC did not reveal iron deficiency.  Her hemoglobin later improved.  Review her blood smear in March revealed occasional myelocytes, occasional nucleated  RBC's, poikilocytosis, and 1 promyelocyte.   We have been concerned about transformation to leukemia, with the changes on peripheral smear.  Her white blood cell count has been running between 16.5 and 24.1, her hemoglobin has been running between 10.2 and 11.7 and platelets running between 108,000 and 129,000 for the past year.  As her counts have been fairly stable, we have continued observation.  Bilateral diagnostic mammogram in January did not reveal any evidence of malignancy, screening mammogram in 1 year was recommended.  INTERVAL HISTORY:  Yolanda Flowers is here today for repeat clinical assessment essential thrombocythemia. She has been off hydroxyurea since October, 2021. Since I saw her last she has been hospitalized with a UTI and COVID and required a stay at a skilled nursing facility. She currently has physical therapy at home but this was supposed to be the last visit. She is aching today due to a fall that occurred this weekend after her last physical therapy and she injured  her left wrist and left hip. I recommended further physical therapy but she does not recall which agency. She feels the fall was caused by small strokes. She continues to take aspirin 180mg  daily and took a extra one that day. She has had severe worsening hearing loss and has a hearing aid of the left ear. Dr. Mathis Bud has tried to address this with lavage and will follow-up, I think she does need to see a audiologist as well. She admits that she has not been drinking much fluids and her last BUN and creatinine were elevated. She is scheduled to see Dr. 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 She has an additional lesion in the right superior periorbital area that will also be addressed. Her blood counts last time were relatively stable, she does have some increase in immature granulocytes and could be evolving into myelofibrosis or AML. We will not pursue that since she is too frail to consider any more aggressive treatment and her blood counts have been stable. Her labs today are pending. I will see her back in 3 months with CBC and CMP.  She denies signs of infection such as sore throat, sinus drainage, cough, or urinary symptoms.  She denies fevers or recurrent chills. She denies pain. She denies nausea, vomiting, chest pain, dyspnea or cough. Her appetite is ok and her weight has decreased 12 pounds over last 3 months .  REVIEW OF SYSTEMS:  Review of Systems  Constitutional:  Positive for fatigue. Negative for appetite change, chills, fever and unexpected weight change.  HENT:   Positive for hearing loss. Negative for lump/mass, mouth sores, nosebleeds, sore throat, tinnitus, trouble swallowing and voice change.   Eyes: Negative.  Negative for eye problems and icterus.  Respiratory: Negative.  Negative for chest tightness, cough, hemoptysis, shortness of breath and wheezing.   Cardiovascular: Negative.  Negative for chest pain, leg swelling and palpitations.   Gastrointestinal: Negative.  Negative for abdominal distention, abdominal pain, blood in stool, constipation, diarrhea, nausea, rectal pain and vomiting.  Endocrine: Negative.  Negative for hot flashes.  Genitourinary:  Positive for pelvic pain. Negative for bladder incontinence, difficulty urinating, dyspareunia, dysuria, frequency, hematuria, menstrual problem, nocturia, vaginal bleeding and vaginal discharge.   Musculoskeletal:  Positive for gait problem (Due to previous CVA, using cane). Negative for arthralgias, back pain, flank pain, myalgias, neck pain and neck stiffness.       Swelling of her left wrist.  Skin: Negative.  Negative for itching,  rash and wound.       Hematoma of her left hip  Neurological:  Positive for gait problem (Due to previous CVA, using cane) and light-headedness. Negative for dizziness, extremity weakness, headaches, numbness, seizures and speech difficulty.       Patient believes she had a small stroke the other day.   Hematological: Negative.  Negative for adenopathy. Does not bruise/bleed easily.  Psychiatric/Behavioral: Negative.  Negative for confusion, decreased concentration, depression, sleep disturbance and suicidal ideas. The patient is not nervous/anxious.      VITALS:  Blood pressure 114/74, pulse 66, temperature 98.4 F (36.9 C), temperature source Oral, resp. rate 18, height 5\' 5"  (1.651 m), weight 138 lb 14.4 oz (63 kg), SpO2 100%.  Wt Readings from Last 3 Encounters:  06/17/23 138 lb 14.4 oz (63 kg)  02/27/23 150 lb 3.2 oz (68.1 kg)  11/28/22 156 lb 6.4 oz (70.9 kg)    Body mass index is 23.11 kg/m.  Performance status (ECOG): 2 - Symptomatic, <50% confined to bed  PHYSICAL EXAM:  Physical Exam Vitals and nursing note reviewed.  Constitutional:      General: She is not in acute distress.    Appearance: Normal appearance. She is normal weight. She is ill-appearing (Chronically ill appearing). She is not toxic-appearing or diaphoretic.   HENT:     Head: Normocephalic and atraumatic.     Right Ear: Tympanic membrane, ear canal and external ear normal. Decreased hearing noted. There is no impacted cerumen.     Left Ear: Tympanic membrane, ear canal and external ear normal. Decreased hearing noted. There is no impacted cerumen.     Nose: Nose normal. No congestion or rhinorrhea.     Mouth/Throat:     Mouth: Mucous membranes are moist.     Pharynx: Oropharynx is clear. No oropharyngeal exudate or posterior oropharyngeal erythema.  Eyes:     General: No scleral icterus.       Right eye: No discharge.        Left eye: No discharge.     Extraocular Movements: Extraocular movements intact.     Conjunctiva/sclera: Conjunctivae normal.     Pupils: Pupils are equal, round, and reactive to light.  Cardiovascular:     Rate and Rhythm: Normal rate and regular rhythm.     Pulses: Normal pulses.     Heart sounds: Normal heart sounds. No murmur heard.    No friction rub. No gallop.  Pulmonary:     Effort: Pulmonary effort is normal. No respiratory distress.     Breath sounds: Normal breath sounds. No wheezing, rhonchi or rales.  Chest:     Comments: Bilateral breasts are without masses.  Abdominal:     General: Bowel sounds are normal. There is no distension.     Palpations: Abdomen is soft. There is no hepatomegaly, splenomegaly or mass.     Tenderness: There is abdominal tenderness in the suprapubic area.  Musculoskeletal:        General: Normal range of motion.     Cervical back: Normal range of motion and neck supple. No tenderness.     Right lower leg: No edema.     Left lower leg: No edema.     Comments: Left wrist has severe erythema of the dorsal surface but the swelling has decreased and she has good range of motion of the wrist and fingers.   Lymphadenopathy:     Cervical: No cervical adenopathy.     Right cervical: No superficial, deep  or posterior cervical adenopathy.    Left cervical: No superficial, deep or  posterior cervical adenopathy.     Upper Body:     Right upper body: No supraclavicular, axillary or pectoral adenopathy.     Left upper body: No supraclavicular, axillary or pectoral adenopathy.     Lower Body: No right inguinal adenopathy. No left inguinal adenopathy.  Skin:    General: Skin is warm and dry.     Coloration: Skin is not jaundiced.     Findings: Bruising, ecchymosis and lesion present. No rash.     Comments: Large hematoma of the lateral left hip. This is not tender but has ecchymosis.  Squamous cell cancer of her left forearm  Another skin cancer of the right superior periorbital area.  Skin is very dry  Neurological:     General: No focal deficit present.     Mental Status: She is alert and oriented to person, place, and time. Mental status is at baseline.     Cranial Nerves: No cranial nerve deficit.     Sensory: No sensory deficit.     Motor: No weakness.     Coordination: Coordination normal.     Gait: Gait normal.  Psychiatric:        Mood and Affect: Mood normal.        Behavior: Behavior normal.        Thought Content: Thought content normal.        Judgment: Judgment normal.    LABS:      Latest Ref Rng & Units 06/17/2023    1:11 PM 02/27/2023   12:00 AM 11/28/2022   12:00 AM  CBC  WBC 4.0 - 10.5 K/uL 17.1  19.8     17.2      Hemoglobin 12.0 - 15.0 g/dL 16.1  09.6     04.5      Hematocrit 36.0 - 46.0 % 32.8  34     34      Platelets 150 - 400 K/uL 143  111     115         This result is from an external source.      Latest Ref Rng & Units 06/17/2023    1:11 PM 02/27/2023    1:12 PM 11/28/2022   12:00 AM  CMP  Glucose 70 - 99 mg/dL 409  94    BUN 8 - 23 mg/dL 18  26  21       Creatinine 0.44 - 1.00 mg/dL 8.11  9.14  1.1      Sodium 135 - 145 mmol/L 138  138  141      Potassium 3.5 - 5.1 mmol/L 3.7  4.4  4.3      Chloride 98 - 111 mmol/L 104  107  108      CO2 22 - 32 mmol/L 23  23  24       Calcium 8.9 - 10.3 mg/dL 8.8  8.8  8.5      Total  Protein 6.5 - 8.1 g/dL 7.4  7.9    Total Bilirubin 0.3 - 1.2 mg/dL 0.9  0.5    Alkaline Phos 38 - 126 U/L 47  62  79      AST 15 - 41 U/L 18  21  36      ALT 0 - 44 U/L 16  20  23          This result is from an external source.    No  results found for: "CEA1", "CEA" / No results found for: "CEA1", "CEA" No results found for: "PSA1" No results found for: "NWG956" No results found for: "CAN125"  No results found for: "TOTALPROTELP", "ALBUMINELP", "A1GS", "A2GS", "BETS", "BETA2SER", "GAMS", "MSPIKE", "SPEI" Lab Results  Component Value Date   TIBC 484 (H) 10/17/2021   TIBC 358 07/10/2021   TIBC 305 12/18/2020   FERRITIN 16 10/17/2021   FERRITIN 29 09/19/2021   FERRITIN 59 07/10/2021   IRONPCTSAT 18 10/17/2021   IRONPCTSAT 26 07/10/2021   IRONPCTSAT 15.4 12/18/2020   No results found for: "LDH"  STUDIES:  CUP PACEART REMOTE DEVICE CHECK  Result Date: 06/10/2023 ILR summary report received. Battery status OK. Normal device function. No new symptom, tachy, brady, or pause episodes. No new AF episodes. Monthly summary reports and ROV/PRN. [facility], [facility]      HISTORY:   Past Medical History:  Diagnosis Date   Abdominal pain    Accelerating angina (HCC) 08/26/2019   Acute blood loss anemia 12/14/2019   Acute cardioembolic stroke (HCC) 11/30/2019   Allergic rhinitis 06/21/2014   Anxiety    Arthritis    Asthma    Atherosclerosis of arteries 08/24/2019   Bilateral edema of lower extremity    Body mass index (BMI) 25.0-25.9, adult 08/24/2019   CAD (coronary artery disease) 10/25/2019   Cellulitis of right leg    Chronic low back pain 01/27/2018   Combined hyperlipidemia 08/24/2019   Complication of anesthesia    difficulty waking   Congestive heart failure (CHF) (HCC)    COPD (chronic obstructive pulmonary disease) (HCC)    Coronary artery disease    Coronary artery disease involving native heart without angina pectoris    Dyspnea 02/09/2019   Dyspnea on exertion     Dysuria 09/19/2021   Embolic stroke (HCC) 11/30/2019   Essential (primary) hypertension 02/20/2021   Essential thrombocythemia (HCC) 06/22/2014   Essential thrombocytosis (HCC) 06/22/2014   Gastrointestinal bleeding 06/23/2018   GERD without esophagitis 06/21/2014   History of melanoma excision    left greast toe 2015   History of squamous cell carcinoma excision    face--  multiple excisions   History of subdural hematoma    2008   HTN (hypertension) 06/21/2014   Hypertension    Ischial bursitis, right 08/24/2019   Leucocytosis 12/14/2019   Leukocytosis 12/14/2019   Malignant melanoma of great toe (HCC) 08/24/2019   Melanoma in situ (HCC) 06/29/2014   Mixed hyperlipidemia 08/24/2019   Opioid dependence (HCC) 08/24/2019   OSA (obstructive sleep apnea)    intolerant   Pedal edema 08/24/2019   Pneumonia of both lungs due to infectious organism 12/20/2020   Recurrent urinary tract infection 02/27/2023   Renal insufficiency 08/24/2019   S/P CABG x 4 11/04/2019   Scoliosis (and kyphoscoliosis), idiopathic 01/27/2018   Skin cancer 08/24/2019   Solitary pulmonary nodule on lung CT 02/09/2019   Squamous cell carcinoma, scalp/neck 08/24/2019   Stroke (HCC) 12/21/2020   Stroke due to embolism (HCC) 11/25/2019   Subdural hematoma (HCC) 12/20/2020   Swelling of limb 08/24/2019   Traumatic subdural hemorrhage with loss of consciousness of unspecified duration, initial encounter (HCC) 02/20/2021   VIN III (vulvar intraepithelial neoplasia III)    Vitamin B 12 deficiency 08/24/2019   Vitamin D deficiency 08/24/2019   Wears dentures    UPPER AND LOWER PARTIAL   Wears glasses     Past Surgical History:  Procedure Laterality Date   ABDOMINAL HYSTERECTOMY  age 70  CARDIAC CATHETERIZATION  03/172021   CORONARY ARTERY BYPASS GRAFT N/A 11/04/2019   Procedure: CORONARY ARTERY BYPASS GRAFTING (CABG) x 4, with ENDOSCOPIC HARVESTING OF RIGHT GREATER SAPHENOUS VEIN.;  Surgeon: Alleen Borne, MD;  Location: [facility] OR;  Service: Open Heart Surgery;  Laterality: N/A;   CORONARY ULTRASOUND/IVUS N/A 11/03/2019   Procedure: Intravascular Ultrasound/IVUS;  Surgeon: Yvonne Kendall, MD;  Location: [facility] INVASIVE CV LAB;  Service: Cardiovascular;  Laterality: N/A;   KNEE ARTHROSCOPY Left 2004   LEFT HEART CATH AND CORONARY ANGIOGRAPHY N/A 11/03/2019   Procedure: LEFT HEART CATH AND CORONARY ANGIOGRAPHY;  Surgeon: Yvonne Kendall, MD;  Location: [facility] INVASIVE CV LAB;  Service: Cardiovascular;  Laterality: N/A;   LOOP RECORDER INSERTION N/A 11/30/2019   Procedure: LOOP RECORDER INSERTION;  Surgeon: Hillis Range, MD;  Location: [facility] INVASIVE CV LAB;  Service: Cardiovascular;  Laterality: N/A;   MELANOMA EXCISION  2015   left great toe   REPAIR PERONEAL TENDONS ANKLE  2004   SUBDURAL HEMATOMA EVACUATION VIA CRANIOTOMY  2008      week later  post-op  [facility]   TEE WITHOUT CARDIOVERSION N/A 11/04/2019   Procedure: TRANSESOPHAGEAL ECHOCARDIOGRAM (TEE);  Surgeon: Alleen Borne, MD;  Location: [facility] OR;  Service: Open Heart Surgery;  Laterality: N/A;   VULVECTOMY N/A 10/24/2015   Procedure: WIDE LOCAL EXCISION OF THE VULVA ;  Surgeon: Adolphus Birchwood, MD;  Location: [facility];  Service: Gynecology;  Laterality: N/A;    Family History  Problem Relation Age of Onset   Clotting disorder Mother    Hypertension Mother    Heart disease Mother    Arthritis Mother    Stroke Father    Hypertension Father    Heart disease Father    Arthritis Father    Emphysema Father    Asthma Father    Kidney failure Father    Hypertension Sister    Heart disease Sister    Arthritis Sister    Hypertension Brother    Heart disease Brother    Arthritis Brother     Social History:  reports that she has never smoked. She has never used smokeless tobacco. She reports that she does not drink alcohol and does not use drugs.The patient is alone today.  Allergies:  Allergies  Allergen Reactions    Cephalexin Hives   Penicillin G    Ciprofloxacin Other (See Comments)    Gi intolerance   Penicillins Rash    Childhood reaction Did it involve swelling of the face/tongue/throat, SOB, or low BP? No Did it involve sudden or severe rash/hives, skin peeling, or any reaction on the inside of your mouth or nose? No Did you need to seek medical attention at a hospital or doctor's office? No When did it last happen?      50 years ago If all above answers are "NO", may proceed with cephalosporin use.     Current Medications: Current Outpatient Medications  Medication Sig Dispense Refill   ammonium lactate (LAC-HYDRIN) 12 % lotion Apply 1 Application topically as needed for dry skin.     aspirin EC 81 MG tablet Take 1 tablet (81 mg total) by mouth daily. Swallow whole. 90 tablet 3   atorvastatin (LIPITOR) 20 MG tablet Take 1 tablet (20 mg total) by mouth daily. 90 tablet 3   b complex vitamins capsule Take 1 capsule by mouth daily.     estradiol (ESTRACE) 0.1 MG/GM vaginal cream Place vaginally. (Patient not taking: Reported on  02/27/2023)     fluticasone (FLONASE) 50 MCG/ACT nasal spray Place 2 sprays into both nostrils at bedtime.      fosfomycin (MONUROL) 3 g PACK SMARTSIG:3 Gram(s) By Mouth Once a Week     magnesium oxide (MAG-OX) 400 MG tablet Take 400 mg by mouth daily.     meclizine (ANTIVERT) 12.5 MG tablet Take 12.5 mg by mouth 2 (two) times daily as needed for dizziness.     metoprolol tartrate (LOPRESSOR) 25 MG tablet TAKE 1 TABLET BY MOUTH TWICE A DAY 180 tablet 1   Multiple Vitamins-Minerals (PRESERVISION AREDS 2) CAPS Take 1 capsule by mouth in the morning and at bedtime.      nitrofurantoin, macrocrystal-monohydrate, (MACROBID) 100 MG capsule Take 1 capsule (100 mg total) by mouth 2 (two) times daily. 14 capsule 0   omeprazole (PRILOSEC) 20 MG capsule Take 20 mg by mouth daily.     Trospium Chloride 60 MG CP24 Take 1 capsule by mouth daily.     Vitamin D, Ergocalciferol,  (DRISDOL) 1.25 MG (50000 UT) CAPS capsule Take 50,000 Units by mouth every 14 (fourteen) days.     Wheat Dextrin (BENEFIBER PO) Take 1 tablet by mouth daily. (Patient not taking: Reported on 02/27/2023)     No current facility-administered medications for this visit.     I,Jasmine M Lassiter,acting as a scribe for Dellia Beckwith, MD.,have documented all relevant documentation on the behalf of Dellia Beckwith, MD,as directed by  Dellia Beckwith, MD while in the presence of Dellia Beckwith, MD.

## 2023-06-18 ENCOUNTER — Telehealth: Payer: Self-pay | Admitting: Oncology

## 2023-06-18 NOTE — Telephone Encounter (Signed)
Patient has been scheduled. Aware of appt date and time.    Scheduling Message Entered by Gery Pray H on 06/17/2023 at  2:03 PM Priority: Routine <No visit type provided>  Department: [facility]  Provider:  Scheduling Notes:  RT 3 months with labs

## 2023-06-19 DIAGNOSIS — C44329 Squamous cell carcinoma of skin of other parts of face: Secondary | ICD-10-CM | POA: Diagnosis not present

## 2023-06-19 DIAGNOSIS — C44629 Squamous cell carcinoma of skin of left upper limb, including shoulder: Secondary | ICD-10-CM | POA: Diagnosis not present

## 2023-06-26 ENCOUNTER — Telehealth: Payer: Self-pay

## 2023-06-26 NOTE — Progress Notes (Signed)
Carelink Summary Report / Loop Recorder 

## 2023-06-26 NOTE — Telephone Encounter (Signed)
-----   Message from Dellia Beckwith sent at 06/18/2023  6:57 AM EDT ----- Regarding: call Tell her labs are stable, just a little more anemic

## 2023-06-26 NOTE — Telephone Encounter (Signed)
[facility] the [facility].

## 2023-06-26 NOTE — Telephone Encounter (Signed)
Attempted to contact patient. No answer. 

## 2023-06-26 NOTE — Telephone Encounter (Signed)
-----   Message from Dellia Beckwith sent at 06/17/2023  1:59 PM EDT ----- Regarding: PT Pls find out which home health agency is coming out for PT, not [facility], and tell them I rec further PT, she has had another fall and needs more

## 2023-07-07 ENCOUNTER — Telehealth: Payer: Self-pay

## 2023-07-07 NOTE — Telephone Encounter (Signed)
Patient states she take Aspirin 81mg  daily.

## 2023-07-07 NOTE — Telephone Encounter (Signed)
-----   Message from Dellia Beckwith sent at 07/05/2023  8:52 AM EST ----- Regarding: aspirin What dose of ASA is she taking?  Jasmine put 180 mg but I don't think that is correct.

## 2023-07-14 ENCOUNTER — Other Ambulatory Visit: Payer: Self-pay

## 2023-07-14 ENCOUNTER — Ambulatory Visit (INDEPENDENT_AMBULATORY_CARE_PROVIDER_SITE_OTHER): Payer: PPO

## 2023-07-14 DIAGNOSIS — N3281 Overactive bladder: Secondary | ICD-10-CM | POA: Diagnosis not present

## 2023-07-14 DIAGNOSIS — I639 Cerebral infarction, unspecified: Secondary | ICD-10-CM | POA: Diagnosis not present

## 2023-07-14 DIAGNOSIS — R21 Rash and other nonspecific skin eruption: Secondary | ICD-10-CM | POA: Diagnosis not present

## 2023-07-14 DIAGNOSIS — N390 Urinary tract infection, site not specified: Secondary | ICD-10-CM | POA: Diagnosis not present

## 2023-07-14 LAB — CUP PACEART REMOTE DEVICE CHECK
Date Time Interrogation Session: 20241122230119
Implantable Pulse Generator Implant Date: 20210413

## 2023-07-15 ENCOUNTER — Encounter: Payer: Self-pay | Admitting: Cardiology

## 2023-07-15 ENCOUNTER — Ambulatory Visit: Payer: PPO | Attending: Cardiology | Admitting: Cardiology

## 2023-07-15 VITALS — BP 144/72 | HR 63 | Ht 65.6 in | Wt 141.2 lb

## 2023-07-15 DIAGNOSIS — I050 Rheumatic mitral stenosis: Secondary | ICD-10-CM | POA: Diagnosis not present

## 2023-07-15 DIAGNOSIS — I2510 Atherosclerotic heart disease of native coronary artery without angina pectoris: Secondary | ICD-10-CM | POA: Diagnosis not present

## 2023-07-15 DIAGNOSIS — I10 Essential (primary) hypertension: Secondary | ICD-10-CM | POA: Diagnosis not present

## 2023-07-15 DIAGNOSIS — Z951 Presence of aortocoronary bypass graft: Secondary | ICD-10-CM | POA: Diagnosis not present

## 2023-07-15 DIAGNOSIS — E782 Mixed hyperlipidemia: Secondary | ICD-10-CM | POA: Diagnosis not present

## 2023-07-15 NOTE — Patient Instructions (Signed)
Medication Instructions:  Your physician recommends that you continue on your current medications as directed. Please refer to the Current Medication list given to you today.  *If you need a refill on your cardiac medications before your next appointment, please call your pharmacy*   Lab Work: None ordered If you have labs (blood work) drawn today and your tests are completely normal, you will receive your results only by: [place] (if you have MyChart) OR A paper copy in the mail If you have any lab test that is abnormal or we need to change your treatment, we will call you to review the results.   Testing/Procedures: None ordered   Follow-Up: At [facility], you and your health needs are our priority.  As part of our continuing mission to provide you with exceptional heart care, we have created designated Provider Care Teams.  These Care Teams include your primary Cardiologist (physician) and Advanced Practice Providers (APPs -  Physician Assistants and Nurse Practitioners) who all work together to provide you with the care you need, when you need it.  We recommend signing up for the patient portal called "MyChart".  Sign up information is provided on this After Visit Summary.  MyChart is used to connect with patients for Virtual Visits (Telemedicine).  Patients are able to view lab/test results, encounter notes, upcoming appointments, etc.  Non-urgent messages can be sent to your provider as well.   To learn more about what you can do with MyChart, go to [email or web address].    Your next appointment:   12 month(s)  The format for your next appointment:   In Person  Provider:   Jyl Heinz, MD    Other Instructions none  Important Information About Sugar

## 2023-07-15 NOTE — Progress Notes (Signed)
Cardiology Office Note:    Date:  07/15/2023   ID:  Yolanda Flowers, DOB 25-Aug-1940, MRN [id number]  PCP:  Yolanda Lei, MD  Cardiologist:  Yolanda Brothers, MD   Referring MD: Yolanda Lei, MD    ASSESSMENT:    1. Mild mitral stenosis   2. Essential (primary) hypertension   3. Coronary artery disease involving native coronary artery of native heart without angina pectoris   4. S/P CABG x 4   5. Mixed hyperlipidemia    PLAN:    In order of problems listed above:  Coronary artery disease: Secondary prevention stressed with the patient.  Importance of compliance with diet medication stressed and she vocalized understanding.  She was advised to ambulate to the best of her ability.  She walks with a cane. Essential hypertension: Blood pressure stable.  Diet was emphasized.  Salt intake issues were discussed with Mixed dyslipidemia: On lipid-lowering medications followed by primary care.  Lipids were noted from [facility] sheet. Obstructive sleep apnea: Sleep health issues were discussed. Patient will be seen in follow-up appointment in 6 months or earlier if the patient has any concerns.    Medication Adjustments/Labs and Tests Ordered: Current medicines are reviewed at length with the patient today.  Concerns regarding medicines are outlined above.  No orders of the defined types were placed in this encounter.  No orders of the defined types were placed in this encounter.    No chief complaint on file.    History of Present Illness:    Yolanda Flowers is a 82 y.o. female.  Patient has past medical history of coronary artery disease post CABG surgery, essential hypertension, mixed dyslipidemia and overall she leads a sedentary lifestyle.  She has had history of stroke.  No chest pain orthopnea or PND.  At the time of my evaluation, the patient is alert awake oriented and in no distress.  Past Medical History:  Diagnosis Date   Abdominal pain    Accelerating angina (HCC) 08/26/2019    Acute blood loss anemia 12/14/2019   Acute cardioembolic stroke (HCC) 11/30/2019   Allergic rhinitis 06/21/2014   Anxiety    Arthritis    Asthma    Atherosclerosis of arteries 08/24/2019   Bilateral edema of lower extremity    Body mass index (BMI) 25.0-25.9, adult 08/24/2019   CAD (coronary artery disease) 10/25/2019   Cellulitis of right leg    Chronic low back pain 01/27/2018   Combined hyperlipidemia 08/24/2019   Complication of anesthesia    difficulty waking   Congestive heart failure (CHF) (HCC)    COPD (chronic obstructive pulmonary disease) (HCC)    Coronary artery disease    Coronary artery disease involving native heart without angina pectoris    Dyspnea 02/09/2019   Dyspnea on exertion    Dysuria 09/19/2021   Embolic stroke (HCC) 11/30/2019   Essential (primary) hypertension 02/20/2021   Essential thrombocythemia (HCC) 06/22/2014   Essential thrombocytosis (HCC) 06/22/2014   Gastrointestinal bleeding 06/23/2018   GERD without esophagitis 06/21/2014   History of melanoma excision    left greast toe 2015   History of squamous cell carcinoma excision    face--  multiple excisions   History of subdural hematoma    2008   HTN (hypertension) 06/21/2014   Hypertension    Idiopathic scoliosis and kyphoscoliosis 01/27/2018   IMO SNOMED Dx Update Oct 2024     Ischial bursitis, right 08/24/2019   Leucocytosis 12/14/2019   Leukocytosis 12/14/2019   Malignant  melanoma of great toe (HCC) 08/24/2019   Melanoma in situ (HCC) 06/29/2014   Mild mitral stenosis 09/20/2022   Mixed hyperlipidemia 08/24/2019   Opioid dependence (HCC) 08/24/2019   OSA (obstructive sleep apnea)    intolerant   Pedal edema 08/24/2019   Pneumonia of both lungs due to infectious organism 12/20/2020   Recurrent urinary tract infection 02/27/2023   Renal insufficiency 08/24/2019   S/P CABG x 4 11/04/2019   Skin cancer 08/24/2019   Solitary pulmonary nodule on lung CT 02/09/2019   Squamous  cell carcinoma, scalp/neck 08/24/2019   Stroke (HCC) 12/21/2020   Stroke due to embolism (HCC) 11/25/2019   Subdural hematoma (HCC) 12/20/2020   Swelling of limb 08/24/2019   Traumatic subdural hemorrhage with loss of consciousness of unspecified duration, initial encounter (HCC) 02/20/2021   VIN III (vulvar intraepithelial neoplasia III)    Vitamin B 12 deficiency 08/24/2019   Vitamin D deficiency 08/24/2019   Wears dentures    UPPER AND LOWER PARTIAL   Wears glasses     Past Surgical History:  Procedure Laterality Date   ABDOMINAL HYSTERECTOMY  age 37   CARDIAC CATHETERIZATION  03/172021   CORONARY ARTERY BYPASS GRAFT N/A 11/04/2019   Procedure: CORONARY ARTERY BYPASS GRAFTING (CABG) x 4, with ENDOSCOPIC HARVESTING OF RIGHT GREATER SAPHENOUS VEIN.;  Surgeon: Alleen Borne, MD;  Location: [facility] OR;  Service: Open Heart Surgery;  Laterality: N/A;   CORONARY ULTRASOUND/IVUS N/A 11/03/2019   Procedure: Intravascular Ultrasound/IVUS;  Surgeon: Yvonne Kendall, MD;  Location: [facility] INVASIVE CV LAB;  Service: Cardiovascular;  Laterality: N/A;   KNEE ARTHROSCOPY Left 2004   LEFT HEART CATH AND CORONARY ANGIOGRAPHY N/A 11/03/2019   Procedure: LEFT HEART CATH AND CORONARY ANGIOGRAPHY;  Surgeon: Yvonne Kendall, MD;  Location: [facility] INVASIVE CV LAB;  Service: Cardiovascular;  Laterality: N/A;   LOOP RECORDER INSERTION N/A 11/30/2019   Procedure: LOOP RECORDER INSERTION;  Surgeon: Hillis Range, MD;  Location: [facility]VASIVE [facility] LAB;  Service: Cardiovascular;  Laterality: N/A;   MELANOMA EXCISION  2015   left great toe   REPAIR PERONEAL TENDONS ANKLE  2004   SUBDURAL HEMATOMA EVACUATION VIA CRANIOTOMY  2008      week later  post-op  [facility]   TEE WITHOUT CARDIOVERSION N/A 11/04/2019   Procedure: TRANSESOPHAGEAL ECHOCARDIOGRAM (TEE);  Surgeon: Alleen Borne, MD;  Location: [facility] OR;  Service: Open Heart Surgery;  Laterality: N/A;   VULVECTOMY N/A 10/24/2015   Procedure: WIDE LOCAL EXCISION OF THE  VULVA ;  Surgeon: Adolphus Birchwood, MD;  Location: [facility];  Service: Gynecology;  Laterality: N/A;    Current Medications: Current Meds  Medication Sig   ammonium lactate (LAC-HYDRIN) 12 % lotion Apply 1 Application topically as needed for dry skin.   aspirin EC 81 MG tablet Take 1 tablet (81 mg total) by mouth daily. Swallow whole.   atorvastatin (LIPITOR) 20 MG tablet Take 1 tablet (20 mg total) by mouth daily.   b complex vitamins capsule Take 1 capsule by mouth daily.   fluticasone (FLONASE) 50 MCG/ACT nasal spray Place 2 sprays into both nostrils at bedtime.    fosfomycin (MONUROL) 3 g PACK SMARTSIG:3 Gram(s) By Mouth Once a Week   magnesium oxide (MAG-OX) 400 MG tablet Take 400 mg by mouth daily.   meclizine (ANTIVERT) 12.5 MG tablet Take 12.5 mg by mouth 2 (two) times daily as needed for dizziness.   metoprolol tartrate (LOPRESSOR) 25 MG tablet TAKE 1 TABLET BY MOUTH TWICE  A DAY   Multiple Vitamins-Minerals (PRESERVISION AREDS 2) CAPS Take 1 capsule by mouth in the morning and at bedtime.    nitrofurantoin, macrocrystal-monohydrate, (MACROBID) 100 MG capsule Take 1 capsule (100 mg total) by mouth 2 (two) times daily.   omeprazole (PRILOSEC) 20 MG capsule Take 20 mg by mouth daily.   Trospium Chloride 60 MG CP24 Take 1 capsule by mouth daily.   Vitamin D, Ergocalciferol, (DRISDOL) 1.25 MG (50000 UT) CAPS capsule Take 50,000 Units by mouth every 14 (fourteen) days.     Allergies:   Cephalexin, Penicillin g, Ciprofloxacin, and Penicillins   Social History   Socioeconomic History   Marital status: Married    Spouse name: Not on file   Number of children: Not on file   Years of education: Not on file   Highest education level: Not on file  Occupational History   Not on file  Tobacco Use   Smoking status: Never   Smokeless tobacco: Never  Vaping Use   Vaping status: Never Used  Substance and Sexual Activity   Alcohol use: No   Drug use: No   Sexual activity:  Not Currently  Other Topics Concern   Not on file  Social History Narrative   Not on file   Social Determinants of Health   Financial Resource Strain: Not on file  Food Insecurity: No Food Insecurity (04/29/2022)   Hunger Vital Sign    Worried About Running Out of Food in the Last Year: Never true    Ran Out of Food in the Last Year: Never true  Transportation Needs: No Transportation Needs (04/29/2022)   PRAPARE - Administrator, Civil Service (Medical): No    Lack of Transportation (Non-Medical): No  Physical Activity: Not on file  Stress: Not on file  Social Connections: Not on file     Family History: The patient's family history includes Arthritis in her brother, father, mother, and sister; Asthma in her father; Clotting disorder in her mother; Emphysema in her father; Heart disease in her brother, father, mother, and sister; Hypertension in her brother, father, mother, and sister; Kidney failure in her father; Stroke in her father.  ROS:   Please see the history of present illness.    All other systems reviewed and are negative.  EKGs/Labs/Other Studies Reviewed:    The following studies were reviewed today: I discussed my findings with the patient at length   Recent Labs: 06/17/2023: ALT 16; BUN 18; Creatinine 0.71; Hemoglobin 10.1; Platelet Count 143; Potassium 3.7; Sodium 138  Recent Lipid Panel    Component Value Date/Time   CHOL 95 03/30/2021 0000   CHOL 105 09/26/2020 0908   TRIG 160 03/30/2021 0000   HDL 32 (A) 03/30/2021 0000   HDL 33 (L) 09/26/2020 0908   CHOLHDL 2.6 12/21/2020 0236   VLDL 21 12/21/2020 0236   LDLCALC 36 03/30/2021 0000   LDLCALC 41 09/26/2020 0908    Physical Exam:    VS:  BP (!) 144/72   Pulse 63   Ht 5' 5.6" (1.666 m)   Wt 141 lb 3.2 oz (64 kg)   SpO2 93%   BMI 23.07 kg/m     Wt Readings from Last 3 Encounters:  07/15/23 141 lb 3.2 oz (64 kg)  06/17/23 138 lb 14.4 oz (63 kg)  02/27/23 150 lb 3.2 oz (68.1 kg)      GEN: Patient is in no acute distress HEENT: Normal NECK: No JVD; No carotid bruits LYMPHATICS:  No lymphadenopathy CARDIAC: Hear sounds regular, 2/6 systolic murmur at the apex. RESPIRATORY:  Clear to auscultation without rales, wheezing or rhonchi  ABDOMEN: Soft, non-tender, non-distended MUSCULOSKELETAL:  No edema; No deformity  SKIN: Warm and dry NEUROLOGIC:  Alert and oriented x 3 PSYCHIATRIC:  Normal affect   Signed, Yolanda Brothers, MD  07/15/2023 10:48 AM    [facility]

## 2023-08-11 NOTE — Progress Notes (Signed)
Carelink Summary Report / Loop Recorder 

## 2023-08-18 ENCOUNTER — Ambulatory Visit (INDEPENDENT_AMBULATORY_CARE_PROVIDER_SITE_OTHER): Payer: PPO

## 2023-08-18 DIAGNOSIS — I639 Cerebral infarction, unspecified: Secondary | ICD-10-CM | POA: Diagnosis not present

## 2023-08-18 LAB — CUP PACEART REMOTE DEVICE CHECK
Date Time Interrogation Session: 20241229230335
Implantable Pulse Generator Implant Date: 20210413

## 2023-09-17 ENCOUNTER — Other Ambulatory Visit: Payer: Self-pay | Admitting: Oncology

## 2023-09-17 ENCOUNTER — Inpatient Hospital Stay: Payer: PPO | Attending: Oncology | Admitting: Oncology

## 2023-09-17 ENCOUNTER — Inpatient Hospital Stay: Payer: PPO

## 2023-09-17 ENCOUNTER — Encounter: Payer: Self-pay | Admitting: Oncology

## 2023-09-17 VITALS — BP 147/81 | HR 58 | Temp 98.4°F | Resp 18 | Ht 65.6 in | Wt 143.3 lb

## 2023-09-17 DIAGNOSIS — Z87440 Personal history of urinary (tract) infections: Secondary | ICD-10-CM | POA: Insufficient documentation

## 2023-09-17 DIAGNOSIS — D75839 Thrombocytosis, unspecified: Secondary | ICD-10-CM | POA: Diagnosis not present

## 2023-09-17 DIAGNOSIS — Z8673 Personal history of transient ischemic attack (TIA), and cerebral infarction without residual deficits: Secondary | ICD-10-CM | POA: Diagnosis not present

## 2023-09-17 DIAGNOSIS — Z8544 Personal history of malignant neoplasm of other female genital organs: Secondary | ICD-10-CM | POA: Diagnosis not present

## 2023-09-17 DIAGNOSIS — Z7982 Long term (current) use of aspirin: Secondary | ICD-10-CM | POA: Insufficient documentation

## 2023-09-17 DIAGNOSIS — Z85820 Personal history of malignant melanoma of skin: Secondary | ICD-10-CM | POA: Insufficient documentation

## 2023-09-17 DIAGNOSIS — D473 Essential (hemorrhagic) thrombocythemia: Secondary | ICD-10-CM | POA: Insufficient documentation

## 2023-09-17 DIAGNOSIS — C4370 Malignant melanoma of unspecified lower limb, including hip: Secondary | ICD-10-CM | POA: Diagnosis not present

## 2023-09-17 DIAGNOSIS — D471 Chronic myeloproliferative disease: Secondary | ICD-10-CM | POA: Diagnosis not present

## 2023-09-17 LAB — CMP (CANCER CENTER ONLY)
ALT: 16 U/L (ref 0–44)
AST: 26 U/L (ref 15–41)
Albumin: 4.3 g/dL (ref 3.5–5.0)
Alkaline Phosphatase: 75 U/L (ref 38–126)
Anion gap: 13 (ref 5–15)
BUN: 15 mg/dL (ref 8–23)
CO2: 25 mmol/L (ref 22–32)
Calcium: 9.1 mg/dL (ref 8.9–10.3)
Chloride: 105 mmol/L (ref 98–111)
Creatinine: 0.96 mg/dL (ref 0.44–1.00)
GFR, Estimated: 58 mL/min — ABNORMAL LOW (ref >60)
Glucose, Bld: 91 mg/dL (ref 70–99)
Potassium: 3.9 mmol/L (ref 3.5–5.1)
Sodium: 143 mmol/L (ref 135–145)
Total Bilirubin: 0.4 mg/dL (ref 0.0–1.2)
Total Protein: 7.6 g/dL (ref 6.5–8.1)

## 2023-09-17 LAB — CBC WITH DIFFERENTIAL (CANCER CENTER ONLY)
Abs Immature Granulocytes: 0.67 10*3/uL — ABNORMAL HIGH (ref 0.00–0.07)
Basophils Absolute: 0.1 10*3/uL (ref 0.0–0.1)
Basophils Relative: 1 %
Eosinophils Absolute: 0.1 10*3/uL (ref 0.0–0.5)
Eosinophils Relative: 1 %
HCT: 31.4 % — ABNORMAL LOW (ref 36.0–46.0)
Hemoglobin: 10.4 g/dL — ABNORMAL LOW (ref 12.0–15.0)
Immature Granulocytes: 6 %
Lymphocytes Relative: 18 %
Lymphs Abs: 2 10*3/uL (ref 0.7–4.0)
MCH: 32.4 pg (ref 26.0–34.0)
MCHC: 33.1 g/dL (ref 30.0–36.0)
MCV: 97.8 fL (ref 80.0–100.0)
Monocytes Absolute: 1.2 10*3/uL — ABNORMAL HIGH (ref 0.1–1.0)
Monocytes Relative: 11 %
Neutro Abs: 7 10*3/uL (ref 1.7–7.7)
Neutrophils Relative %: 63 %
Platelet Count: 104 10*3/uL — ABNORMAL LOW (ref 150–400)
RBC: 3.21 MIL/uL — ABNORMAL LOW (ref 3.87–5.11)
RDW: 17 % — ABNORMAL HIGH (ref 11.5–15.5)
WBC Count: 11.1 10*3/uL — ABNORMAL HIGH (ref 4.0–10.5)
nRBC: 0 /100{WBCs}
nRBC: 0.04 % (ref 0.0–0.2)

## 2023-09-17 NOTE — Progress Notes (Signed)
[facility]  [address] [place],  [place]  [address] [phone]  Clinic Day: 09/17/23  Referring physician: Lucianne Lei, MD  ASSESSMENT & PLAN:  Assessment: Essential thrombocythemia [facility]) Essential thrombocythemia previously treated with hydroxyurea.  Due to low normal platelets, hydroxyurea was discontinued in October 2021.  She has persistent leukocytosis/neutrophilia, which she has had on and off since 2018.  This is felt to be due to her myeloproliferative disease.  Her counts remain stable, so we recommend continued observation.  We are seeing metamyelocytes and myelocytes but her blood counts remain relatively stable. We will not pursue that since she is too frail to consider any more aggressive treatment.  Recurrent urinary tract infections She has persistent symptoms of UTI despite recent treatment with nitrofurantoin.  We will obtain urinalysis and urine culture today and plan to contact Dr. Hope Pigeon with the results.  I encouraged the patient to try the Azo-pyridium over the counter and at least some of the UTI prevention recommendations.  Multiple Skin Cancers She has even had malignant melanoma and melanoma in situ. She continues to see the dermatologist regularly.   History of Vulvar Cancer This has been treated and she has no evidence of disease at last check.   TIA/Recurrent Strokes She knows to stay on Aspirin and would probably benefit from more anticoagulation, but I feel this is risky in view of her frequent falls.   Plan: Patient has increasing extremity weakness due to decreasing physical therapy since she is no longer at the skilled nursing facility. 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 Mayford Knife. She has a elevated WBC of 11.1 with 6% immature granulocytes up from 4%, a low hemoglobin of 10.4 up from 10.1, and a low platelet count of 104,000, down  from 143,000. Her CMP is completely normal. I will see her back in 3 months with CBC and CMP. The patient understands the plans discussed today and is in agreement with them. She knows to contact our office if she develops concerns prior to her next appointment.  I provided 15 minutes of face-to-face time during this encounter and > 50% was spent counseling as documented under my assessment and plan.   Dellia Beckwith, MD  [facility] [address] [place] [address] Dept: [phone] Dept Fax: [phone]   No orders of the defined types were placed in this encounter.   CHIEF COMPLAINT:  CC: Essential thrombocythemia  Current Treatment: Surveillance  HISTORY OF PRESENT ILLNESS:  Yolanda Flowers is a 83 year old female with a history of essential thrombocythemia originally diagnosed in 53.  She was treated with hydroxyurea 500 mg 3 times daily.  We had to occasionally adjust doses because of leukopenia or anemia.  She also had a malignant melanoma of her left first toe resected in November 2015 with partial amputation of the toe and has done well.  She has had multiple other skin cancers removed over the years.  In March 2017, she was seen by Dr. Andrey Farmer for HSIL/VIN 3 of the right posterior labia minora and underwent vulvectomy.  Hydroxyurea was decreased to 500 mg twice daily prior to surgery, as we did not feel we could safely hold hydroxyurea.  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  Bone density scan in March  2018 revealed osteopenia with a T-score of -1.6 in the femur, for which she is on calcium and vitamin-D.  She has had chronic back pain, which is attributed to severe degenerative disease.  She had an MRI of the lumbar spine in March 2018, which revealed significant degenerative disc disease with mild spinal stenosis at  L1 and L4, but moderate at L2-3.  Dr. Ollen Bowl has given her injections, as well at hydrocodone/APAP 5/325.  She has had intermittent leukocytosis since 2018, felt to be secondary to her essential thrombocythemia. She was evaluated in the emergency department in October 2019 and had a GI bleed. CT abdomen and pelvis revealed acute uncomplicated diverticulitis of the sigmoid colon with no other acute abnormalities.  A moderate to large hiatal hernia was seen. She saw Dr. Tomie China and underwent nuclear med stress test in November, which did not reveal any evidence of inducible ischemia or other abnormality.  Left ventricular size and function was normal with an ejection fraction of 79%.  She underwent EGD and colonoscopy in December 2019, and no significant source of blood loss was identified. She was instructed to continue lansoprazole.  She had dilatation of an esophageal stricture and removal of a polyp. Pathology revealed a fragmented tubular adenoma.    In January 2020, she had worsening anemia, in addition to continued episodes of chest pain and worsening dyspnea with exertion at her routine follow-up. Due to the severe dyspnea and right lower extremity edema, we obtained a CT angiogram chest, which did not reveal any evidence of pulmonary embolism.  There was good opacification of the pulmonary arteries.  Diffuse coronary artery calcifications with cardiomegaly and moderate thoracic aortic atherosclerosis was seen.  There was a 3.6 mm noncalcified nodule in the anterior inferior right upper lobe.  Non-contrasted CT chest in 6-12 months was recommended.  Right lower extremity venous Doppler ultrasound did not reveal any evidence of deep venous thrombosis. In February 2020, she had persistent anemia with a hemoglobin of 9.7. As she was symptomatic, hydroxyurea 500 mg was decreased to once daily.  Soluble transferrin was elevated, which was consistent with iron deficiency.  She saw Dr. Jennye Boroughs and he placed  her on iron supplementation daily in the form of ferrous sulfate 65 mg. She was seen again in March and had improvement in her anemia with a hemoglobin of 10.2.  Her thrombocythemia remained controlled on hydroxyurea 500 mg daily. We encouraged her to have follow-up for her vulvar cancer, so followed up with Dr. Alison Murray and her exam was normal.     She had a bilateral screening mammogram in February 2021, which revealed an area of calcifications in the right breast warranting further evaluation.  A diagnostic right mammogram was ordered, but this was delayed, because she underwent open heart surgery with CABG x4 in March.  She had a monitor placed in the left chest wall the time of surgery.  She did not feel she can have mammogram due to the monitor in the left chest wall.  She then had 2 strokes following her surgery, from which she has recovered well, except for balance issues and left eye blindness.  She ambulates with a cane or walker depending on her circumstances.  She is on clopidogrel 75 mg daily, atorvastatin 80 mg daily, metoprolol 25 mg twice daily and pantoprazole 40 mg daily. She had a CT chest in July to follow-up on the pulmonary nodule which were stable. She finally underwent diagnostic right mammogram from July which revealed likely  benign 6 mm group of calcifications involving the upper inner quadrant at middle depth. Bilateral diagnostic mammogram in February 2022 was recommended  At her visit in October 2021, she reported elevated transaminases and purpura, which was attributed to atorvastatin.  Dr. Tomie China reduced the dose from 80 mg to 40 mg with normalization of the transaminases and no further purpura.  Her platelets were in low normal range, so hydroxyurea was held and since her platelets have remained normal, hydroxyurea was discontinued. She has had intermittent mild leukocytosis/neutrophilia since 2018.  She has occasionally had immature cells on her blood smear, but not  blasts.    When I saw her in May 2022, she had worsening leukocytosis with persistent immature cells and worsening anemia, so I recommended a bone marrow biopsy, but this was never done as the patient was hospitalized with a stroke and discharged to rehab. She was seen again in August, at which time her white count had decreased from 24.2 to 19.9 with an ANC of 1473, with 11% lymphocytes, 10% monocytes and 2.4 % basophils and her hemoglobin had improved from 10.6 to 11.3. The platelet count remained normal, so continued observation was recommended.  Her blood counts were stable in November.   She was admitted in January 2023 with a 3 day history of sensation of an object stuck in her throat following attempted swallowing of calcium supplementation. She was eventually able to dislodge item after several hours.  She then developed stool that she described as "motor oil" for 2 days. She was evaluated in the emergency room and was found to have hemoglobin of 8.3, decreased from 11.4 in November 2022.   She underwent urgent EGD and was found to have a 3 cm mucosal tear within the distal 5 cm of her esophagus, felt to be most likely secondary to mucosal tear following calcium carbonate impaction. CT neck and chest was performed to evaluate for possibility of mediastinitis developing following possible full focus mucosal tear. This imaging did not reveal active inflammatory changes. 3 clips were placed closing the tear successfully and complete hemostasis was achieved.  She was placed on twice daily pantoprazole with slow advancement of diet from clear liquid to house select over 2 days.  She had an acute drop in hemoglobin to 6.9 following further equilibration, so received 1 unit PRBC with appropriate response to 8.2 g/dL. Her hemoglobin remained at approximately 8 for the remainder of admission.  She was instructed to take iron supplementation while at home and to avoid calcium carbonate for time being and to  transition to over-the-counter chewable supplementation.  Iron/TIBC did not reveal iron deficiency.  Her hemoglobin later improved.  Review her blood smear in March revealed occasional myelocytes, occasional nucleated RBC's, poikilocytosis, and 1 promyelocyte. We have been concerned about transformation to leukemia, with the changes on peripheral smear.  Her white blood cell count has been running between 16.5 and 24.1, her hemoglobin has been running between 10.2 and 11.7 and platelets running between 108,000 and 129,000 for the past year.  As her counts have been fairly stable, we have continued observation.  Bilateral diagnostic mammogram in January did not reveal any evidence of malignancy, screening mammogram in 1 year was recommended.  INTERVAL HISTORY:  Yolanda Flowers is here today for repeat clinical assessment of her essential thrombocythemia. She has been off hydroxyurea since October, 2021. Patient states that she feels ok but complains of back pain rating 7/10 and extremity weakness due to decreasing physical therapy since she is  no longer at the skilled nursing facility. 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 Mayford Knife. She has an elevated WBC of 11.1 with 6% immature granulocytes up from 4%, a low hemoglobin of 10.4 up from 10.1, and a low platelet count of 104,000 down from 143,000. Her CMP is completely normal. I will see her back in 3 months with CBC and CMP.   She denies signs of infection such as sore throat, sinus drainage, cough, or urinary symptoms.  She denies fevers or recurrent chills. She denies pain. She denies nausea, vomiting, chest pain, dyspnea or cough. Her appetite is good and her weight has increased 2 pounds over last 2 months .  REVIEW OF SYSTEMS:  Review of Systems  Constitutional:  Positive for fatigue. Negative for appetite change, chills, fever and unexpected weight change.  HENT:   Positive for  hearing loss. Negative for lump/mass, mouth sores, nosebleeds, sore throat, tinnitus, trouble swallowing and voice change.   Eyes: Negative.  Negative for eye problems and icterus.  Respiratory: Negative.  Negative for chest tightness, cough, hemoptysis, shortness of breath and wheezing.   Cardiovascular: Negative.  Negative for chest pain, leg swelling and palpitations.  Gastrointestinal: Negative.  Negative for abdominal distention, abdominal pain, blood in stool, constipation, diarrhea, nausea, rectal pain and vomiting.  Endocrine: Negative.  Negative for hot flashes.  Genitourinary:  Positive for pelvic pain. Negative for bladder incontinence, difficulty urinating, dyspareunia, dysuria, frequency, hematuria, menstrual problem, nocturia, vaginal bleeding and vaginal discharge.   Musculoskeletal:  Positive for back pain (7/10) and gait problem (Due to previous CVA, using cane). Negative for arthralgias, flank pain, myalgias, neck pain and neck stiffness.  Skin: Negative.  Negative for itching, rash and wound.  Neurological:  Positive for extremity weakness, gait problem (Due to previous CVA, using cane) and light-headedness. Negative for dizziness, headaches, numbness, seizures and speech difficulty.  Hematological: Negative.  Negative for adenopathy. Does not bruise/bleed easily.  Psychiatric/Behavioral: Negative.  Negative for confusion, decreased concentration, depression, sleep disturbance and suicidal ideas. The patient is not nervous/anxious.      VITALS:  Blood pressure (!) 147/81, pulse (!) 58, temperature 98.4 F (36.9 C), temperature source Oral, resp. rate 18, height 5' 5.6" (1.666 m), weight 143 lb 4.8 oz (65 kg), SpO2 98%.  Wt Readings from Last 3 Encounters:  09/17/23 143 lb 4.8 oz (65 kg)  07/15/23 141 lb 3.2 oz (64 kg)  06/17/23 138 lb 14.4 oz (63 kg)    Body mass index is 23.41 kg/m.  Performance status (ECOG): 2 - Symptomatic, <50% confined to bed  PHYSICAL EXAM:   Physical Exam Vitals and nursing note reviewed.  Constitutional:      General: She is not in acute distress.    Appearance: Normal appearance. She is normal weight. She is ill-appearing (Chronically ill appearing). She is not toxic-appearing or diaphoretic.  HENT:     Head: Normocephalic and atraumatic.     Right Ear: Tympanic membrane, ear canal and external ear normal. Decreased hearing noted. There is no impacted cerumen.     Left Ear: Tympanic membrane, ear canal and external ear normal. Decreased hearing noted. There is no impacted cerumen.     Nose: Nose normal. No congestion or rhinorrhea.     Mouth/Throat:     Mouth: Mucous membranes are moist.     Pharynx: Oropharynx is clear. No oropharyngeal exudate or posterior oropharyngeal erythema.  Eyes:  General: No scleral icterus.       Right eye: No discharge.        Left eye: No discharge.     Extraocular Movements: Extraocular movements intact.     Conjunctiva/sclera: Conjunctivae normal.     Pupils: Pupils are equal, round, and reactive to light.  Cardiovascular:     Rate and Rhythm: Normal rate and regular rhythm.     Pulses: Normal pulses.     Heart sounds: Murmur heard.     No friction rub. No gallop.  Pulmonary:     Effort: Pulmonary effort is normal. No respiratory distress.     Breath sounds: Normal breath sounds. No wheezing, rhonchi or rales.  Chest:     Comments: Bilateral breasts are without masses.  Abdominal:     General: Bowel sounds are normal. There is no distension.     Palpations: Abdomen is soft. There is no hepatomegaly, splenomegaly or mass.  Musculoskeletal:        General: Normal range of motion.     Cervical back: Normal range of motion and neck supple. No tenderness.     Right lower leg: No edema.     Left lower leg: 1+ Edema present.  Lymphadenopathy:     Cervical: No cervical adenopathy.     Right cervical: No superficial, deep or posterior cervical adenopathy.    Left cervical: No  superficial, deep or posterior cervical adenopathy.     Upper Body:     Right upper body: No supraclavicular, axillary or pectoral adenopathy.     Left upper body: No supraclavicular, axillary or pectoral adenopathy.     Lower Body: No right inguinal adenopathy. No left inguinal adenopathy.  Skin:    General: Skin is warm and dry.     Coloration: Skin is not jaundiced.     Findings: Lesion present. No bruising, ecchymosis or rash.     Comments: She has a large well healed scar in the upper left arm Resected squamous cell carcinoma of the right forehead She also has a keratotic lesion of the left nose Skin is very dry  Neurological:     General: No focal deficit present.     Mental Status: She is alert and oriented to person, place, and time. Mental status is at baseline.     Cranial Nerves: No cranial nerve deficit.     Sensory: No sensory deficit.     Motor: No weakness.     Coordination: Coordination normal.     Gait: Gait normal.  Psychiatric:        Mood and Affect: Mood normal.        Behavior: Behavior normal.        Thought Content: Thought content normal.        Judgment: Judgment normal.    LABS:      Latest Ref Rng & Units 09/17/2023   12:49 PM 06/17/2023    1:11 PM 02/27/2023   12:00 AM  CBC  WBC 4.0 - 10.5 K/uL 11.1  17.1  19.8      Hemoglobin 12.0 - 15.0 g/dL 65.7  84.6  96.2      Hematocrit 36.0 - 46.0 % 31.4  32.8  34      Platelets 150 - 400 K/uL 104  143  111         This result is from an external source.      Latest Ref Rng & Units 09/17/2023   12:49 PM 06/17/2023  1:11 PM 02/27/2023    1:12 PM  CMP  Glucose 70 - 99 mg/dL 91  161  94   BUN 8 - 23 mg/dL 15  18  26    Creatinine 0.44 - 1.00 mg/dL 0.96  0.45  4.09   Sodium 135 - 145 mmol/L 143  138  138   Potassium 3.5 - 5.1 mmol/L 3.9  3.7  4.4   Chloride 98 - 111 mmol/L 105  104  107   CO2 22 - 32 mmol/L 25  23  23    Calcium 8.9 - 10.3 mg/dL 9.1  8.8  8.8   Total Protein 6.5 - 8.1 g/dL 7.6  7.4   7.9   Total Bilirubin 0.0 - 1.2 mg/dL 0.4  0.9  0.5   Alkaline Phos 38 - 126 U/L 75  47  62   AST 15 - 41 U/L 26  18  21    ALT 0 - 44 U/L 16  16  20      No results found for: "CEA1", "CEA" / No results found for: "CEA1", "CEA" No results found for: "PSA1" No results found for: "WJX914" No results found for: "CAN125"  No results found for: "TOTALPROTELP", "ALBUMINELP", "A1GS", "A2GS", "BETS", "BETA2SER", "GAMS", "MSPIKE", "SPEI" Lab Results  Component Value Date   TIBC 484 (H) 10/17/2021   TIBC 358 07/10/2021   TIBC 305 12/18/2020   FERRITIN 16 10/17/2021   FERRITIN 29 09/19/2021   FERRITIN 59 07/10/2021   IRONPCTSAT 18 10/17/2021   IRONPCTSAT 26 07/10/2021   IRONPCTSAT 15.4 12/18/2020   No results found for: "LDH"  STUDIES:  No results found.    HISTORY:   Past Medical History:  Diagnosis Date   Abdominal pain    Accelerating angina (HCC) 08/26/2019   Acute blood loss anemia 12/14/2019   Acute cardioembolic stroke (HCC) 11/30/2019   Allergic rhinitis 06/21/2014   Anxiety    Arthritis    Asthma    Atherosclerosis of arteries 08/24/2019   Bilateral edema of lower extremity    Body mass index (BMI) 25.0-25.9, adult 08/24/2019   CAD (coronary artery disease) 10/25/2019   Cellulitis of right leg    Chronic low back pain 01/27/2018   Combined hyperlipidemia 08/24/2019   Complication of anesthesia    difficulty waking   Congestive heart failure (CHF) (HCC)    COPD (chronic obstructive pulmonary disease) (HCC)    Coronary artery disease    Coronary artery disease involving native heart without angina pectoris    Dyspnea 02/09/2019   Dyspnea on exertion    Dysuria 09/19/2021   Embolic stroke (HCC) 11/30/2019   Essential (primary) hypertension 02/20/2021   Essential thrombocythemia (HCC) 06/22/2014   Essential thrombocytosis (HCC) 06/22/2014   Gastrointestinal bleeding 06/23/2018   GERD without esophagitis 06/21/2014   History of melanoma excision    left greast  toe 2015   History of squamous cell carcinoma excision    face--  multiple excisions   History of subdural hematoma    2008   HTN (hypertension) 06/21/2014   Hypertension    Idiopathic scoliosis and kyphoscoliosis 01/27/2018   IMO SNOMED Dx Update Oct 2024     Ischial bursitis, right 08/24/2019   Leucocytosis 12/14/2019   Leukocytosis 12/14/2019   Malignant melanoma of great toe (HCC) 08/24/2019   Melanoma in situ (HCC) 06/29/2014   Mild mitral stenosis 09/20/2022   Mixed hyperlipidemia 08/24/2019   Opioid dependence (HCC) 08/24/2019   OSA (obstructive sleep apnea)    intolerant  Pedal edema 08/24/2019   Pneumonia of both lungs due to infectious organism 12/20/2020   Recurrent urinary tract infection 02/27/2023   Renal insufficiency 08/24/2019   S/P CABG x 4 11/04/2019   Skin cancer 08/24/2019   Solitary pulmonary nodule on lung CT 02/09/2019   Squamous cell carcinoma, scalp/neck 08/24/2019   Stroke (HCC) 12/21/2020   Stroke due to embolism (HCC) 11/25/2019   Subdural hematoma (HCC) 12/20/2020   Swelling of limb 08/24/2019   Traumatic subdural hemorrhage with loss of consciousness of unspecified duration, initial encounter (HCC) 02/20/2021   VIN III (vulvar intraepithelial neoplasia III)    Vitamin B 12 deficiency 08/24/2019   Vitamin D deficiency 08/24/2019   Wears dentures    UPPER AND LOWER PARTIAL   Wears glasses     Past Surgical History:  Procedure Laterality Date   ABDOMINAL HYSTERECTOMY  age 35   CARDIAC CATHETERIZATION  03/172021   CORONARY ARTERY BYPASS GRAFT N/A 11/04/2019   Procedure: CORONARY ARTERY BYPASS GRAFTING (CABG) x 4, with ENDOSCOPIC HARVESTING OF RIGHT GREATER SAPHENOUS VEIN.;  Surgeon: Alleen Borne, MD;  Location: [facility] OR;  Service: Open Heart Surgery;  Laterality: N/A;   CORONARY ULTRASOUND/IVUS N/A 11/03/2019   Procedure: Intravascular Ultrasound/IVUS;  Surgeon: Yvonne Kendall, MD;  Location: [facility] INVASIVE CV LAB;  Service: Cardiovascular;   Laterality: N/A;   KNEE ARTHROSCOPY Left 2004   LEFT HEART CATH AND CORONARY ANGIOGRAPHY N/A 11/03/2019   Procedure: LEFT HEART CATH AND CORONARY ANGIOGRAPHY;  Surgeon: Yvonne Kendall, MD;  Location: [facility] INVASIVE CV LAB;  Service: Cardiovascular;  Laterality: N/A;   LOOP RECORDER INSERTION N/A 11/30/2019   Procedure: LOOP RECORDER INSERTION;  Surgeon: Hillis Range, MD;  Location: [facility] INVASIVE CV LAB;  Service: Cardiovascular;  Laterality: N/A;   MELANOMA EXCISION  2015   left great toe   REPAIR PERONEAL TENDONS ANKLE  2004   SUBDURAL HEMATOMA EVACUATION VIA CRANIOTOMY  2008      week later  post-op  [facility]   TEE WITHOUT CARDIOVERSION N/A 11/04/2019   Procedure: TRANSESOPHAGEAL ECHOCARDIOGRAM (TEE);  Surgeon: Alleen Borne, MD;  [facility];  Service: Open Heart Surgery;  Laterality: N/A;   VULVECTOMY N/A 10/24/2015   Procedure: WIDE LOCAL EXCISION OF THE VULVA ;  Surgeon: Adolphus Birchwood, MD;  Location: [facility];  Service: Gynecology;  Laterality: N/A;    Family History  Problem Relation Age of Onset   Clotting disorder Mother    Hypertension Mother    Heart disease Mother    Arthritis Mother    Stroke Father    Hypertension Father    Heart disease Father    Arthritis Father    Emphysema Father    Asthma Father    Kidney failure Father    Hypertension Sister    Heart disease Sister    Arthritis Sister    Hypertension Brother    Heart disease Brother    Arthritis Brother     Social History:  reports that she has never smoked. She has never used smokeless tobacco. She reports that she does not drink alcohol and does not use drugs.The patient is alone today.  Allergies:  Allergies  Allergen Reactions   Cephalexin Hives   Penicillin G    Ciprofloxacin Other (See Comments)    Gi intolerance   Penicillins Rash    Childhood reaction Did it involve swelling of the face/tongue/throat, SOB, or low BP? No Did it involve sudden or severe rash/hives, skin  peeling,  or any reaction on the inside of your mouth or nose? No Did you need to seek medical attention at a hospital or doctor's office? No When did it last happen?      50 years ago If all above answers are "NO", may proceed with cephalosporin use.     Current Medications: Current Outpatient Medications  Medication Sig Dispense Refill   ammonium lactate (LAC-HYDRIN) 12 % lotion Apply 1 Application topically as needed for dry skin.     aspirin EC 81 MG tablet Take 1 tablet (81 mg total) by mouth daily. Swallow whole. 90 tablet 3   atorvastatin (LIPITOR) 20 MG tablet Take 1 tablet (20 mg total) by mouth daily. 90 tablet 3   b complex vitamins capsule Take 1 capsule by mouth daily.     fluticasone (FLONASE) 50 MCG/ACT nasal spray Place 2 sprays into both nostrils at bedtime.      fosfomycin (MONUROL) 3 g PACK SMARTSIG:3 Gram(s) By Mouth Once a Week     magnesium oxide (MAG-OX) 400 MG tablet Take 400 mg by mouth daily.     meclizine (ANTIVERT) 12.5 MG tablet Take 12.5 mg by mouth 2 (two) times daily as needed for dizziness.     metoprolol tartrate (LOPRESSOR) 25 MG tablet TAKE 1 TABLET BY MOUTH TWICE A DAY 180 tablet 1   Multiple Vitamins-Minerals (PRESERVISION AREDS 2) CAPS Take 1 capsule by mouth in the morning and at bedtime.      omeprazole (PRILOSEC) 20 MG capsule Take 20 mg by mouth daily.     Trospium Chloride 60 MG CP24 Take 1 capsule by mouth daily.     Vitamin D, Ergocalciferol, (DRISDOL) 1.25 MG (50000 UT) CAPS capsule Take 50,000 Units by mouth every 14 (fourteen) days.     No current facility-administered medications for this visit.     I,Jasmine M Lassiter,acting as a scribe for Dellia Beckwith, MD.,have documented all relevant documentation on the behalf of Dellia Beckwith, MD,as directed by  Dellia Beckwith, MD while in the presence of Dellia Beckwith, MD.

## 2023-09-22 ENCOUNTER — Ambulatory Visit: Payer: PPO

## 2023-09-22 DIAGNOSIS — I639 Cerebral infarction, unspecified: Secondary | ICD-10-CM

## 2023-09-23 LAB — CUP PACEART REMOTE DEVICE CHECK
Date Time Interrogation Session: 20250202230025
Implantable Pulse Generator Implant Date: 20210413

## 2023-09-28 ENCOUNTER — Other Ambulatory Visit: Payer: Self-pay | Admitting: Cardiology

## 2023-10-09 DIAGNOSIS — Z043 Encounter for examination and observation following other accident: Secondary | ICD-10-CM | POA: Diagnosis not present

## 2023-10-09 DIAGNOSIS — W19XXXA Unspecified fall, initial encounter: Secondary | ICD-10-CM | POA: Diagnosis not present

## 2023-10-09 DIAGNOSIS — S40011A Contusion of right shoulder, initial encounter: Secondary | ICD-10-CM | POA: Diagnosis not present

## 2023-10-09 DIAGNOSIS — I10 Essential (primary) hypertension: Secondary | ICD-10-CM | POA: Diagnosis not present

## 2023-10-09 DIAGNOSIS — R9431 Abnormal electrocardiogram [ECG] [EKG]: Secondary | ICD-10-CM | POA: Diagnosis not present

## 2023-10-09 DIAGNOSIS — M25562 Pain in left knee: Secondary | ICD-10-CM | POA: Diagnosis not present

## 2023-10-09 DIAGNOSIS — N3000 Acute cystitis without hematuria: Secondary | ICD-10-CM | POA: Diagnosis not present

## 2023-10-09 DIAGNOSIS — M6282 Rhabdomyolysis: Secondary | ICD-10-CM | POA: Diagnosis not present

## 2023-10-09 DIAGNOSIS — S62112A Displaced fracture of triquetrum [cuneiform] bone, left wrist, initial encounter for closed fracture: Secondary | ICD-10-CM | POA: Diagnosis not present

## 2023-10-09 DIAGNOSIS — I517 Cardiomegaly: Secondary | ICD-10-CM | POA: Diagnosis not present

## 2023-10-09 DIAGNOSIS — T148XXA Other injury of unspecified body region, initial encounter: Secondary | ICD-10-CM | POA: Diagnosis not present

## 2023-10-10 DIAGNOSIS — J449 Chronic obstructive pulmonary disease, unspecified: Secondary | ICD-10-CM | POA: Diagnosis not present

## 2023-10-10 DIAGNOSIS — R262 Difficulty in walking, not elsewhere classified: Secondary | ICD-10-CM | POA: Diagnosis not present

## 2023-10-10 DIAGNOSIS — M1990 Unspecified osteoarthritis, unspecified site: Secondary | ICD-10-CM | POA: Diagnosis not present

## 2023-10-10 DIAGNOSIS — I517 Cardiomegaly: Secondary | ICD-10-CM | POA: Diagnosis not present

## 2023-10-10 DIAGNOSIS — M6282 Rhabdomyolysis: Secondary | ICD-10-CM | POA: Diagnosis not present

## 2023-10-10 DIAGNOSIS — N390 Urinary tract infection, site not specified: Secondary | ICD-10-CM | POA: Diagnosis not present

## 2023-10-10 DIAGNOSIS — I4891 Unspecified atrial fibrillation: Secondary | ICD-10-CM | POA: Diagnosis not present

## 2023-10-10 DIAGNOSIS — S62112A Displaced fracture of triquetrum [cuneiform] bone, left wrist, initial encounter for closed fracture: Secondary | ICD-10-CM | POA: Diagnosis not present

## 2023-10-10 DIAGNOSIS — Z79899 Other long term (current) drug therapy: Secondary | ICD-10-CM | POA: Diagnosis not present

## 2023-10-10 DIAGNOSIS — I10 Essential (primary) hypertension: Secondary | ICD-10-CM | POA: Diagnosis not present

## 2023-10-10 DIAGNOSIS — E785 Hyperlipidemia, unspecified: Secondary | ICD-10-CM | POA: Diagnosis not present

## 2023-10-10 DIAGNOSIS — N3000 Acute cystitis without hematuria: Secondary | ICD-10-CM | POA: Diagnosis not present

## 2023-10-10 DIAGNOSIS — M810 Age-related osteoporosis without current pathological fracture: Secondary | ICD-10-CM | POA: Diagnosis not present

## 2023-10-10 DIAGNOSIS — R531 Weakness: Secondary | ICD-10-CM | POA: Diagnosis not present

## 2023-10-10 DIAGNOSIS — W19XXXA Unspecified fall, initial encounter: Secondary | ICD-10-CM | POA: Diagnosis not present

## 2023-10-10 DIAGNOSIS — S40011A Contusion of right shoulder, initial encounter: Secondary | ICD-10-CM | POA: Diagnosis not present

## 2023-10-10 DIAGNOSIS — D72829 Elevated white blood cell count, unspecified: Secondary | ICD-10-CM | POA: Diagnosis not present

## 2023-10-10 DIAGNOSIS — Z7982 Long term (current) use of aspirin: Secondary | ICD-10-CM | POA: Diagnosis not present

## 2023-10-10 DIAGNOSIS — M25532 Pain in left wrist: Secondary | ICD-10-CM | POA: Diagnosis not present

## 2023-10-10 DIAGNOSIS — D649 Anemia, unspecified: Secondary | ICD-10-CM | POA: Diagnosis not present

## 2023-10-10 DIAGNOSIS — B9620 Unspecified Escherichia coli [E. coli] as the cause of diseases classified elsewhere: Secondary | ICD-10-CM | POA: Diagnosis not present

## 2023-10-10 DIAGNOSIS — R9431 Abnormal electrocardiogram [ECG] [EKG]: Secondary | ICD-10-CM | POA: Diagnosis not present

## 2023-10-10 DIAGNOSIS — M25562 Pain in left knee: Secondary | ICD-10-CM | POA: Diagnosis not present

## 2023-10-10 DIAGNOSIS — D696 Thrombocytopenia, unspecified: Secondary | ICD-10-CM | POA: Diagnosis not present

## 2023-10-10 DIAGNOSIS — Z043 Encounter for examination and observation following other accident: Secondary | ICD-10-CM | POA: Diagnosis not present

## 2023-10-10 DIAGNOSIS — E860 Dehydration: Secondary | ICD-10-CM | POA: Diagnosis not present

## 2023-10-10 DIAGNOSIS — Z87440 Personal history of urinary (tract) infections: Secondary | ICD-10-CM | POA: Diagnosis not present

## 2023-10-10 DIAGNOSIS — Z8673 Personal history of transient ischemic attack (TIA), and cerebral infarction without residual deficits: Secondary | ICD-10-CM | POA: Diagnosis not present

## 2023-10-10 DIAGNOSIS — Z951 Presence of aortocoronary bypass graft: Secondary | ICD-10-CM | POA: Diagnosis not present

## 2023-10-10 DIAGNOSIS — K219 Gastro-esophageal reflux disease without esophagitis: Secondary | ICD-10-CM | POA: Diagnosis not present

## 2023-10-15 DIAGNOSIS — M800AXD Age-related osteoporosis with current pathological fracture, other site, subsequent encounter for fracture with routine healing: Secondary | ICD-10-CM | POA: Diagnosis not present

## 2023-10-15 DIAGNOSIS — I10 Essential (primary) hypertension: Secondary | ICD-10-CM | POA: Diagnosis not present

## 2023-10-15 DIAGNOSIS — J4489 Other specified chronic obstructive pulmonary disease: Secondary | ICD-10-CM | POA: Diagnosis not present

## 2023-10-15 DIAGNOSIS — T796XXD Traumatic ischemia of muscle, subsequent encounter: Secondary | ICD-10-CM | POA: Diagnosis not present

## 2023-10-15 DIAGNOSIS — N390 Urinary tract infection, site not specified: Secondary | ICD-10-CM | POA: Diagnosis not present

## 2023-10-15 DIAGNOSIS — D696 Thrombocytopenia, unspecified: Secondary | ICD-10-CM | POA: Diagnosis not present

## 2023-10-15 DIAGNOSIS — Z95818 Presence of other cardiac implants and grafts: Secondary | ICD-10-CM | POA: Diagnosis not present

## 2023-10-15 DIAGNOSIS — E860 Dehydration: Secondary | ICD-10-CM | POA: Diagnosis not present

## 2023-10-15 DIAGNOSIS — K219 Gastro-esophageal reflux disease without esophagitis: Secondary | ICD-10-CM | POA: Diagnosis not present

## 2023-10-15 DIAGNOSIS — Z89412 Acquired absence of left great toe: Secondary | ICD-10-CM | POA: Diagnosis not present

## 2023-10-15 DIAGNOSIS — Z951 Presence of aortocoronary bypass graft: Secondary | ICD-10-CM | POA: Diagnosis not present

## 2023-10-15 DIAGNOSIS — B9620 Unspecified Escherichia coli [E. coli] as the cause of diseases classified elsewhere: Secondary | ICD-10-CM | POA: Diagnosis not present

## 2023-10-15 DIAGNOSIS — Z79899 Other long term (current) drug therapy: Secondary | ICD-10-CM | POA: Diagnosis not present

## 2023-10-15 DIAGNOSIS — M1812 Unilateral primary osteoarthritis of first carpometacarpal joint, left hand: Secondary | ICD-10-CM | POA: Diagnosis not present

## 2023-10-15 DIAGNOSIS — I4891 Unspecified atrial fibrillation: Secondary | ICD-10-CM | POA: Diagnosis not present

## 2023-10-15 DIAGNOSIS — Z85820 Personal history of malignant melanoma of skin: Secondary | ICD-10-CM | POA: Diagnosis not present

## 2023-10-15 DIAGNOSIS — Z87440 Personal history of urinary (tract) infections: Secondary | ICD-10-CM | POA: Diagnosis not present

## 2023-10-15 DIAGNOSIS — M1712 Unilateral primary osteoarthritis, left knee: Secondary | ICD-10-CM | POA: Diagnosis not present

## 2023-10-15 DIAGNOSIS — M6282 Rhabdomyolysis: Secondary | ICD-10-CM | POA: Diagnosis not present

## 2023-10-15 DIAGNOSIS — Z8673 Personal history of transient ischemic attack (TIA), and cerebral infarction without residual deficits: Secondary | ICD-10-CM | POA: Diagnosis not present

## 2023-10-15 DIAGNOSIS — E785 Hyperlipidemia, unspecified: Secondary | ICD-10-CM | POA: Diagnosis not present

## 2023-10-15 DIAGNOSIS — Z9181 History of falling: Secondary | ICD-10-CM | POA: Diagnosis not present

## 2023-10-15 DIAGNOSIS — Z7982 Long term (current) use of aspirin: Secondary | ICD-10-CM | POA: Diagnosis not present

## 2023-10-15 DIAGNOSIS — K5790 Diverticulosis of intestine, part unspecified, without perforation or abscess without bleeding: Secondary | ICD-10-CM | POA: Diagnosis not present

## 2023-10-15 DIAGNOSIS — Z792 Long term (current) use of antibiotics: Secondary | ICD-10-CM | POA: Diagnosis not present

## 2023-10-15 DIAGNOSIS — D649 Anemia, unspecified: Secondary | ICD-10-CM | POA: Diagnosis not present

## 2023-10-21 DIAGNOSIS — Z9181 History of falling: Secondary | ICD-10-CM | POA: Diagnosis not present

## 2023-10-21 DIAGNOSIS — M159 Polyosteoarthritis, unspecified: Secondary | ICD-10-CM | POA: Diagnosis not present

## 2023-10-21 DIAGNOSIS — I10 Essential (primary) hypertension: Secondary | ICD-10-CM | POA: Diagnosis not present

## 2023-10-21 DIAGNOSIS — J449 Chronic obstructive pulmonary disease, unspecified: Secondary | ICD-10-CM | POA: Diagnosis not present

## 2023-10-21 DIAGNOSIS — Z09 Encounter for follow-up examination after completed treatment for conditions other than malignant neoplasm: Secondary | ICD-10-CM | POA: Diagnosis not present

## 2023-10-21 DIAGNOSIS — Z89419 Acquired absence of unspecified great toe: Secondary | ICD-10-CM | POA: Diagnosis not present

## 2023-10-21 DIAGNOSIS — M19012 Primary osteoarthritis, left shoulder: Secondary | ICD-10-CM | POA: Diagnosis not present

## 2023-10-21 DIAGNOSIS — Z79899 Other long term (current) drug therapy: Secondary | ICD-10-CM | POA: Diagnosis not present

## 2023-10-21 DIAGNOSIS — K219 Gastro-esophageal reflux disease without esophagitis: Secondary | ICD-10-CM | POA: Diagnosis not present

## 2023-10-21 DIAGNOSIS — E785 Hyperlipidemia, unspecified: Secondary | ICD-10-CM | POA: Diagnosis not present

## 2023-10-21 DIAGNOSIS — E538 Deficiency of other specified B group vitamins: Secondary | ICD-10-CM | POA: Diagnosis not present

## 2023-10-21 DIAGNOSIS — Z8673 Personal history of transient ischemic attack (TIA), and cerebral infarction without residual deficits: Secondary | ICD-10-CM | POA: Diagnosis not present

## 2023-10-21 DIAGNOSIS — E559 Vitamin D deficiency, unspecified: Secondary | ICD-10-CM | POA: Diagnosis not present

## 2023-10-21 DIAGNOSIS — H539 Unspecified visual disturbance: Secondary | ICD-10-CM | POA: Diagnosis not present

## 2023-10-21 DIAGNOSIS — I700 Atherosclerosis of aorta: Secondary | ICD-10-CM | POA: Diagnosis not present

## 2023-10-27 ENCOUNTER — Ambulatory Visit (INDEPENDENT_AMBULATORY_CARE_PROVIDER_SITE_OTHER): Payer: PPO

## 2023-10-27 DIAGNOSIS — I639 Cerebral infarction, unspecified: Secondary | ICD-10-CM | POA: Diagnosis not present

## 2023-10-27 LAB — CUP PACEART REMOTE DEVICE CHECK
Date Time Interrogation Session: 20250309230220
Implantable Pulse Generator Implant Date: 20210413

## 2023-10-29 NOTE — Addendum Note (Signed)
 Addended by: Geralyn Flash D on: 10/29/2023 03:03 PM   Modules accepted: Orders

## 2023-10-29 NOTE — Progress Notes (Signed)
 [facility] Summary Report / Loop Recorder

## 2023-11-13 DIAGNOSIS — Z1212 Encounter for screening for malignant neoplasm of rectum: Secondary | ICD-10-CM | POA: Diagnosis not present

## 2023-11-13 DIAGNOSIS — R350 Frequency of micturition: Secondary | ICD-10-CM | POA: Diagnosis not present

## 2023-11-13 DIAGNOSIS — D649 Anemia, unspecified: Secondary | ICD-10-CM | POA: Diagnosis not present

## 2023-11-13 DIAGNOSIS — Z6823 Body mass index (BMI) 23.0-23.9, adult: Secondary | ICD-10-CM | POA: Diagnosis not present

## 2023-11-13 LAB — IRON,TIBC AND FERRITIN PANEL
%SAT: 26
Ferritin: 43
Iron: 92
TIBC: 351
UIBC: 259

## 2023-11-13 LAB — CBC AND DIFFERENTIAL
HCT: 32 — AB (ref 36–46)
Hemoglobin: 10.2 — AB (ref 12.0–16.0)
Neutrophils Absolute: 10.7
Platelets: 138 10*3/uL — AB (ref 150–400)
WBC: 16.4

## 2023-11-13 LAB — CBC: RBC: 3.2 — AB (ref 3.87–5.11)

## 2023-11-13 LAB — FOLATE: Folate: >20

## 2023-11-13 LAB — VITAMIN B12: VITAMIN B12: 533

## 2023-11-24 ENCOUNTER — Telehealth: Payer: Self-pay | Admitting: Oncology

## 2023-11-24 NOTE — Telephone Encounter (Signed)
 11/24/23 Patient requested appts be rescheduled from 11/27/23 to 12/03/23.

## 2023-11-27 ENCOUNTER — Inpatient Hospital Stay: Admitting: Oncology

## 2023-11-27 ENCOUNTER — Inpatient Hospital Stay

## 2023-12-01 ENCOUNTER — Ambulatory Visit: Payer: PPO

## 2023-12-01 DIAGNOSIS — I639 Cerebral infarction, unspecified: Secondary | ICD-10-CM | POA: Diagnosis not present

## 2023-12-01 LAB — CUP PACEART REMOTE DEVICE CHECK
Date Time Interrogation Session: 20250413230213
Implantable Pulse Generator Implant Date: 20210413

## 2023-12-01 NOTE — Progress Notes (Signed)
 [facility] Summary Report / Loop Recorder

## 2023-12-03 ENCOUNTER — Encounter: Payer: Self-pay | Admitting: Oncology

## 2023-12-03 ENCOUNTER — Other Ambulatory Visit: Payer: Self-pay | Admitting: Oncology

## 2023-12-03 ENCOUNTER — Inpatient Hospital Stay: Attending: Oncology

## 2023-12-03 ENCOUNTER — Inpatient Hospital Stay: Admitting: Oncology

## 2023-12-03 VITALS — BP 148/86 | HR 61 | Temp 98.5°F | Resp 18 | Ht 65.6 in | Wt 136.0 lb

## 2023-12-03 DIAGNOSIS — I110 Hypertensive heart disease with heart failure: Secondary | ICD-10-CM | POA: Diagnosis not present

## 2023-12-03 DIAGNOSIS — Z85820 Personal history of malignant melanoma of skin: Secondary | ICD-10-CM | POA: Insufficient documentation

## 2023-12-03 DIAGNOSIS — D72819 Decreased white blood cell count, unspecified: Secondary | ICD-10-CM | POA: Insufficient documentation

## 2023-12-03 DIAGNOSIS — D473 Essential (hemorrhagic) thrombocythemia: Secondary | ICD-10-CM

## 2023-12-03 DIAGNOSIS — D071 Carcinoma in situ of vulva: Secondary | ICD-10-CM | POA: Insufficient documentation

## 2023-12-03 DIAGNOSIS — Z87440 Personal history of urinary (tract) infections: Secondary | ICD-10-CM | POA: Insufficient documentation

## 2023-12-03 DIAGNOSIS — I2510 Atherosclerotic heart disease of native coronary artery without angina pectoris: Secondary | ICD-10-CM | POA: Insufficient documentation

## 2023-12-03 DIAGNOSIS — D696 Thrombocytopenia, unspecified: Secondary | ICD-10-CM | POA: Diagnosis not present

## 2023-12-03 DIAGNOSIS — J4489 Other specified chronic obstructive pulmonary disease: Secondary | ICD-10-CM | POA: Insufficient documentation

## 2023-12-03 DIAGNOSIS — G8929 Other chronic pain: Secondary | ICD-10-CM | POA: Insufficient documentation

## 2023-12-03 DIAGNOSIS — E782 Mixed hyperlipidemia: Secondary | ICD-10-CM | POA: Insufficient documentation

## 2023-12-03 DIAGNOSIS — R911 Solitary pulmonary nodule: Secondary | ICD-10-CM | POA: Diagnosis not present

## 2023-12-03 DIAGNOSIS — Z7982 Long term (current) use of aspirin: Secondary | ICD-10-CM | POA: Insufficient documentation

## 2023-12-03 DIAGNOSIS — Z9181 History of falling: Secondary | ICD-10-CM | POA: Insufficient documentation

## 2023-12-03 DIAGNOSIS — D649 Anemia, unspecified: Secondary | ICD-10-CM | POA: Insufficient documentation

## 2023-12-03 DIAGNOSIS — Z8673 Personal history of transient ischemic attack (TIA), and cerebral infarction without residual deficits: Secondary | ICD-10-CM | POA: Diagnosis not present

## 2023-12-03 DIAGNOSIS — G4733 Obstructive sleep apnea (adult) (pediatric): Secondary | ICD-10-CM | POA: Insufficient documentation

## 2023-12-03 DIAGNOSIS — E538 Deficiency of other specified B group vitamins: Secondary | ICD-10-CM | POA: Insufficient documentation

## 2023-12-03 DIAGNOSIS — E559 Vitamin D deficiency, unspecified: Secondary | ICD-10-CM | POA: Insufficient documentation

## 2023-12-03 DIAGNOSIS — R32 Unspecified urinary incontinence: Secondary | ICD-10-CM | POA: Diagnosis not present

## 2023-12-03 DIAGNOSIS — I700 Atherosclerosis of aorta: Secondary | ICD-10-CM | POA: Insufficient documentation

## 2023-12-03 DIAGNOSIS — Z85828 Personal history of other malignant neoplasm of skin: Secondary | ICD-10-CM | POA: Diagnosis not present

## 2023-12-03 LAB — CBC WITH DIFFERENTIAL (CANCER CENTER ONLY)
Abs Immature Granulocytes: 0.62 K/uL — ABNORMAL HIGH (ref 0.00–0.07)
Basophils Absolute: 0.2 K/uL — ABNORMAL HIGH (ref 0.0–0.1)
Basophils Relative: 2 %
Eosinophils Absolute: 0.1 K/uL (ref 0.0–0.5)
Eosinophils Relative: 1 %
HCT: 34.5 % — ABNORMAL LOW (ref 36.0–46.0)
Hemoglobin: 10.7 g/dL — ABNORMAL LOW (ref 12.0–15.0)
Immature Granulocytes: 6 %
Lymphocytes Relative: 24 %
Lymphs Abs: 2.5 K/uL (ref 0.7–4.0)
MCH: 31.6 pg (ref 26.0–34.0)
MCHC: 31 g/dL (ref 30.0–36.0)
MCV: 101.8 fL — ABNORMAL HIGH (ref 80.0–100.0)
Monocytes Absolute: 1.3 K/uL — ABNORMAL HIGH (ref 0.1–1.0)
Monocytes Relative: 13 %
Neutro Abs: 5.6 K/uL (ref 1.7–7.7)
Neutrophils Relative %: 54 %
Platelet Count: 117 K/uL — ABNORMAL LOW (ref 150–400)
RBC: 3.39 MIL/uL — ABNORMAL LOW (ref 3.87–5.11)
RDW: 17.3 % — ABNORMAL HIGH (ref 11.5–15.5)
WBC Count: 10.3 K/uL (ref 4.0–10.5)
WBC Morphology: ABNORMAL
nRBC: 0 % (ref 0.0–0.2)
nRBC: 0 /100{WBCs}

## 2023-12-03 LAB — CMP (CANCER CENTER ONLY)
ALT: 13 U/L (ref 0–44)
AST: 24 U/L (ref 15–41)
Albumin: 4.2 g/dL (ref 3.5–5.0)
Alkaline Phosphatase: 73 U/L (ref 38–126)
Anion gap: 10 (ref 5–15)
BUN: 14 mg/dL (ref 8–23)
CO2: 25 mmol/L (ref 22–32)
Calcium: 9 mg/dL (ref 8.9–10.3)
Chloride: 106 mmol/L (ref 98–111)
Creatinine: 0.97 mg/dL (ref 0.44–1.00)
GFR, Estimated: 57 mL/min — ABNORMAL LOW (ref >60)
Glucose, Bld: 109 mg/dL — ABNORMAL HIGH (ref 70–99)
Potassium: 4.3 mmol/L (ref 3.5–5.1)
Sodium: 142 mmol/L (ref 135–145)
Total Bilirubin: 0.3 mg/dL (ref 0.0–1.2)
Total Protein: 7.8 g/dL (ref 6.5–8.1)

## 2023-12-03 NOTE — Progress Notes (Addendum)
 [facility]  [address] [place],  [place]  [address] [phone]  Clinic Day: 12/03/23  Referring physician: Tamela Fake, MD  ASSESSMENT & PLAN:  Assessment: Essential thrombocythemia [facility]) Essential thrombocythemia previously treated with hydroxyurea .  Due to low normal platelets, hydroxyurea  was discontinued in October 2021.  She has persistent leukocytosis/neutrophilia, which she has had on and off since 2018.  This is felt to be due to her myeloproliferative disease.  Her counts remain stable, so we recommend continued observation.  We are seeing metamyelocytes and myelocytes but her blood counts remain relatively stable. We will not pursue that since she is too frail to consider any more aggressive treatment.  Recurrent urinary tract infections She has persistent symptoms of UTI despite recent treatment with nitrofurantoin .  I encouraged the patient to try the Azo-pyridium over the counter and at least some of the UTI prevention recommendations. She is now on Mybetriq for her urinary incontinence.   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 She continues to see the dermatologist regularly.   History of Vulvar Cancer This has been treated and she has no evidence of disease at last check.   TIA/Recurrent Strokes She knows to stay on Aspirin  and would probably benefit from more anticoagulation, but I feel this is risky in view of her frequent falls.   Plan: She had labs done at her PCP office in March which revealed an elevated WBC of 16.4, low hemoglobin of 7 but had improved to 10.2 on repeat, and low platelet count of 138,000. Her low hemoglobin was thought to be a lab error so she was referred to follow-up with me just in case. Her B-12, folate, and iron studies were all normal. She has a history of essential thrombocythemia and has been off hydroxyurea  since October, 2021. 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 Patient states that she feels well but complains of back pain rating 5/10. She has a WBC of 10.3, stable low hemoglobin of 10.7, and a low platelet count of 117,000. Her CMP is normal and I will see her back in 3 months with CBC and CMP. The patient understands the plans discussed today and is in agreement with them. She knows to contact our office if she develops concerns prior to her next appointment.  I provided 26 minutes of face-to-face time during this encounter and > 50% was spent counseling as documented under my assessment and plan.   Nolia Baumgartner, MD  [facility] - A DEPT OF [facility] [address] [place] [address] Dept: [phone] Dept Fax: [phone]   No orders of the defined types were placed in this encounter.   CHIEF COMPLAINT:  CC: Essential thrombocythemia  Current Treatment: Surveillance  HISTORY OF PRESENT ILLNESS:  CHUDNEY CUBIT is a 83 year old female with a history of essential thrombocythemia originally diagnosed in 62.  She was treated with hydroxyurea  500 mg 3 times daily.  We had to occasionally adjust doses because of leukopenia or anemia.  She also had a malignant melanoma of her left first toe resected in November 2015 with partial amputation of the toe and has done well.  She has had multiple other skin cancers removed over the years.  In March 2017, she was seen by Dr. Pearly Bound for HSIL/VIN 3 of the right posterior labia minora and underwent vulvectomy.  Hydroxyurea  was decreased to 500 mg twice daily prior to surgery, as we did not feel we could safely hold hydroxyurea .  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  Bone density scan in March 2018 revealed osteopenia with a T-score of -1.6 in the femur, for which she is on calcium  and  vitamin-D.  She has had chronic back pain, which is attributed to severe degenerative disease.  She had an MRI of the lumbar spine in March 2018, which revealed significant degenerative disc disease with mild spinal stenosis at L1 and L4, but moderate at L2-3.  Dr. Victory Gravel has given her injections, as well at hydrocodone/APAP 5/325.  She has had intermittent leukocytosis since 2018, felt to be secondary to her essential thrombocythemia. She was evaluated in the emergency department in October 2019 and had a GI bleed. CT abdomen and pelvis revealed acute uncomplicated diverticulitis of the sigmoid colon with no other acute abnormalities.  A moderate to large hiatal hernia was seen. She saw Dr. Lafayette Pierre and underwent nuclear med stress test in November, which did not reveal any evidence of inducible ischemia or other abnormality.  Left ventricular size and function was normal with an ejection fraction of 79%.  She underwent EGD and colonoscopy in December 2019, and no significant source of blood loss was identified. She was instructed to continue lansoprazole.  She had dilatation of an esophageal stricture and removal of a polyp. Pathology revealed a fragmented tubular adenoma.    In January 2020, she had worsening anemia, in addition to continued episodes of chest pain and worsening dyspnea with exertion at her routine follow-up. Due to the severe dyspnea and right lower extremity edema, we obtained a CT angiogram chest, which did not reveal any evidence of pulmonary embolism.  There was good opacification of the pulmonary arteries.  Diffuse coronary artery calcifications with cardiomegaly and moderate thoracic aortic atherosclerosis was seen.  There was a 3.6 mm noncalcified nodule in the anterior inferior right upper lobe. Right lower extremity venous Doppler ultrasound did not reveal any evidence of deep venous thrombosis. In February 2020, she had persistent anemia with a hemoglobin of 9.7. As she was  symptomatic, hydroxyurea  500 mg was decreased to once daily.  Soluble transferrin was elevated, which was consistent with iron deficiency.  She saw Dr. Monico Anna and he placed her on iron supplementation daily in the form of ferrous sulfate 65 mg. She was seen again in March and had improvement in her anemia with a hemoglobin of 10.2.  Her thrombocythemia remained controlled on hydroxyurea  500 mg daily. We encouraged her to have follow-up for her vulvar cancer, so followed up with Dr. Conan December and her exam was normal.     She had a bilateral screening mammogram in February 2021, which revealed an area of calcifications in the right breast warranting further evaluation.  A diagnostic right mammogram was ordered, but this was delayed, because she underwent open heart surgery with CABG x4 in March.  She had a monitor placed in the left chest wall the time of surgery.  She did not feel she can have mammogram due to the monitor in the left chest wall.  She then had 2 strokes following her surgery, from which she has recovered well, except for balance issues and left eye blindness.  She ambulates with a cane or walker depending on her circumstances.  She is on clopidogrel  75 mg daily, atorvastatin  80 mg daily, metoprolol   25 mg twice daily and pantoprazole  40 mg daily. She had a CT chest in July to follow-up on the pulmonary nodule which were stable. She finally underwent diagnostic right mammogram from July which revealed likely benign 6 mm group of calcifications involving the upper inner quadrant at middle depth. Bilateral diagnostic mammogram in February 2022 was recommended  At her visit in October 2021, she reported elevated transaminases and purpura, which was attributed to atorvastatin .  Dr. Revankar reduced the dose from 80 mg to 40 mg with normalization of the transaminases and no further purpura.  Her platelets were in low normal range, so hydroxyurea  was held and since her platelets have remained  normal, hydroxyurea  was discontinued. She has had intermittent mild leukocytosis/neutrophilia since 2018.  She has occasionally had immature cells on her blood smear, but not blasts.    When I saw her in May 2022, she had worsening leukocytosis with persistent immature cells and worsening anemia, so I recommended a bone marrow biopsy, but this was never done as the patient was hospitalized with a stroke and discharged to rehab. She was seen again in August, at which time her white count had decreased from 24.2 to 19.9 with an ANC of 1473, with 11% lymphocytes, 10% monocytes and 2.4 % basophils and her hemoglobin had improved from 10.6 to 11.3. The platelet count remained normal, so continued observation was recommended.  Her blood counts were stable in November.   She was admitted in January 2023 with a 3 day history of sensation of an object stuck in her throat following attempted swallowing of calcium  supplementation. She was eventually able to dislodge item after several hours.  She then developed stool that she described as "motor oil" for 2 days. She was evaluated in the emergency room and was found to have hemoglobin of 8.3, decreased from 11.4 in November 2022.   She underwent urgent EGD and was found to have a 3 cm mucosal tear within the distal 5 cm of her esophagus, felt to be most likely secondary to mucosal tear following calcium  carbonate impaction. CT neck and chest was performed to evaluate for possibility of mediastinitis developing following possible full focus mucosal tear. This imaging did not reveal active inflammatory changes. 3 clips were placed closing the tear successfully and complete hemostasis was achieved.  She was placed on twice daily pantoprazole  with slow advancement of diet from clear liquid to house select over 2 days.  She had an acute drop in hemoglobin to 6.9 following further equilibration, so received 1 unit PRBC with appropriate response to 8.2 g/dL.Her hemoglobin later  improved.  Review of her blood smear in March revealed occasional myelocytes, occasional nucleated RBC's, poikilocytosis, and 1 promyelocyte. We have been concerned about transformation to leukemia, with the changes on peripheral smear.  Her white blood cell count has been running between 16.5 and 24.1, her hemoglobin has been running between 10.2 and 11.7 and platelets running between 108,000 and 129,000 for the past year.  As her counts have been fairly stable, we have continued observation.  Bilateral diagnostic mammogram in January did not reveal any evidence of malignancy, screening mammogram in 1 year was recommended.  INTERVAL HISTORY:  Tangelia is here today for an added on appointment due to abnormal labs. She had labs done at her PCP office in March which revealed an elevated WBC of 16.4, low hemoglobin of 7 but had improved to 10.2 on repeat, and low platelet count of 138,000. Her low hemoglobin was thought to  be a lab error but she was referred to follow-up with me just in case. Her B-12, folate, and iron studies were all normal. She has a history of essential thrombocythemia and has been off hydroxyurea  since October, 2021. I think she has evolved into myelofibrosis and does have occasional immature granulocytes. Patient states that she feels well but complains of back pain rating 5/10. She has a WBC of 10.3, stable low hemoglobin of 10.7, and a low platelet count of 117,000. Her CMP is normal and I will see her back in 3 months with CBC and CMP.   She denies signs of infection such as sore throat, sinus drainage, cough, or urinary symptoms.  She denies fevers or recurrent chills. She denies nausea, vomiting, chest pain, dyspnea or cough. Her appetite is good and her weight has decreased 7 pounds over last 4 months .   REVIEW OF SYSTEMS:  Review of Systems  Constitutional:  Positive for fatigue. Negative for appetite change, chills, fever and unexpected weight change.  HENT:   Positive for  hearing loss. Negative for lump/mass, mouth sores, nosebleeds, sore throat, tinnitus, trouble swallowing and voice change.   Eyes: Negative.  Negative for eye problems and icterus.  Respiratory: Negative.  Negative for chest tightness, cough, hemoptysis, shortness of breath and wheezing.   Cardiovascular: Negative.  Negative for chest pain, leg swelling and palpitations.  Gastrointestinal: Negative.  Negative for abdominal distention, abdominal pain, blood in stool, constipation, diarrhea, nausea, rectal pain and vomiting.  Endocrine: Negative.  Negative for hot flashes.  Genitourinary:  Negative for bladder incontinence, difficulty urinating, dyspareunia, dysuria, frequency, hematuria, menstrual problem, nocturia, pelvic pain, vaginal bleeding and vaginal discharge.   Musculoskeletal:  Positive for back pain (7/10) and gait problem (Due to previous CVA, using cane). Negative for arthralgias, flank pain, myalgias, neck pain and neck stiffness.  Skin: Negative.  Negative for itching, rash and wound.  Neurological:  Positive for extremity weakness and gait problem (Due to previous CVA, using cane). Negative for dizziness, headaches, light-headedness, numbness, seizures and speech difficulty.  Hematological: Negative.  Negative for adenopathy. Does not bruise/bleed easily.  Psychiatric/Behavioral: Negative.  Negative for confusion, decreased concentration, depression, sleep disturbance and suicidal ideas. The patient is not nervous/anxious.      VITALS:  Blood pressure (!) 148/86, pulse 61, temperature 98.5 F (36.9 C), temperature source Oral, resp. rate 18, height 5' 5.6" (1.666 m), weight 136 lb (61.7 kg), SpO2 100%.  Wt Readings from Last 3 Encounters:  12/03/23 136 lb (61.7 kg)  09/17/23 143 lb 4.8 oz (65 kg)  07/15/23 141 lb 3.2 oz (64 kg)    Body mass index is 22.22 kg/m.  Performance status (ECOG): 1 - Symptomatic but completely ambulatory  PHYSICAL EXAM:  Physical Exam Vitals and  nursing note reviewed.  Constitutional:      General: She is not in acute distress.    Appearance: Normal appearance. She is normal weight. She is not ill-appearing, toxic-appearing or diaphoretic.  HENT:     Head: Normocephalic and atraumatic.     Right Ear: Tympanic membrane, ear canal and external ear normal. Decreased hearing noted. There is no impacted cerumen.     Left Ear: Tympanic membrane, ear canal and external ear normal. Decreased hearing noted. There is no impacted cerumen.     Nose: Nose normal. No congestion or rhinorrhea.     Mouth/Throat:     Mouth: Mucous membranes are moist.     Pharynx: Oropharynx is clear. No oropharyngeal exudate  or posterior oropharyngeal erythema.  Eyes:     General: No scleral icterus.       Right eye: No discharge.        Left eye: No discharge.     Extraocular Movements: Extraocular movements intact.     Conjunctiva/sclera: Conjunctivae normal.     Pupils: Pupils are equal, round, and reactive to light.  Cardiovascular:     Rate and Rhythm: Normal rate and regular rhythm.     Pulses: Normal pulses.     Heart sounds: Murmur heard.     Systolic murmur is present with a grade of 2/6.     No friction rub. No gallop.  Pulmonary:     Effort: Pulmonary effort is normal. No respiratory distress.     Breath sounds: Normal breath sounds. No wheezing, rhonchi or rales.  Chest:     Comments: Bilateral breasts are without masses.  Abdominal:     General: Bowel sounds are normal. There is no distension.     Palpations: Abdomen is soft. There is no hepatomegaly, splenomegaly or mass.  Musculoskeletal:        General: Normal range of motion.     Cervical back: Normal range of motion and neck supple. No tenderness.     Right lower leg: No edema.     Left lower leg: No edema.  Lymphadenopathy:     Cervical: No cervical adenopathy.     Right cervical: No superficial, deep or posterior cervical adenopathy.    Left cervical: No superficial, deep or  posterior cervical adenopathy.     Upper Body:     Right upper body: No supraclavicular, axillary or pectoral adenopathy.     Left upper body: No supraclavicular, axillary or pectoral adenopathy.     Lower Body: No right inguinal adenopathy. No left inguinal adenopathy.  Skin:    General: Skin is warm and dry.     Coloration: Skin is not jaundiced.     Findings: No bruising, ecchymosis, lesion or rash.  Neurological:     General: No focal deficit present.     Mental Status: She is alert and oriented to person, place, and time. Mental status is at baseline.     Cranial Nerves: No cranial nerve deficit.     Sensory: No sensory deficit.     Motor: No weakness.     Coordination: Coordination normal.     Gait: Gait normal.  Psychiatric:        Mood and Affect: Mood normal.        Behavior: Behavior normal.        Thought Content: Thought content normal.        Judgment: Judgment normal.    LABS:      Latest Ref Rng & Units 12/03/2023    3:47 PM 11/13/2023   12:00 AM 09/17/2023   12:49 PM  CBC  WBC 4.0 - 10.5 K/uL 10.3  16.4     11.1   Hemoglobin 12.0 - 15.0 g/dL 13.0  86.5     78.4   Hematocrit 36.0 - 46.0 % 34.5  32     31.4   Platelets 150 - 400 K/uL 117  138     104      This result is from an external source.      Latest Ref Rng & Units 12/03/2023    3:47 PM 09/17/2023   12:49 PM 06/17/2023    1:11 PM  CMP  Glucose 70 - 99 mg/dL  109  91  106   BUN 8 - 23 mg/dL 14  15  18    Creatinine 0.44 - 1.00 mg/dL 4.09  8.11  9.14   Sodium 135 - 145 mmol/L 142  143  138   Potassium 3.5 - 5.1 mmol/L 4.3  3.9  3.7   Chloride 98 - 111 mmol/L 106  105  104   CO2 22 - 32 mmol/L 25  25  23    Calcium  8.9 - 10.3 mg/dL 9.0  9.1  8.8   Total Protein 6.5 - 8.1 g/dL 7.8  7.6  7.4   Total Bilirubin 0.0 - 1.2 mg/dL 0.3  0.4  0.9   Alkaline Phos 38 - 126 U/L 73  75  47   AST 15 - 41 U/L 24  26  18    ALT 0 - 44 U/L 13  16  16     No results found for: "CEA1", "CEA" / No results found for:  "CEA1", "CEA" No results found for: "PSA1" No results found for: "NWG956" No results found for: "CAN125"  No results found for: "TOTALPROTELP", "ALBUMINELP", "A1GS", "A2GS", "BETS", "BETA2SER", "GAMS", "MSPIKE", "SPEI" Lab Results  Component Value Date   TIBC 351 11/13/2023   TIBC 484 (H) 10/17/2021   TIBC 358 07/10/2021   FERRITIN 43 11/13/2023   FERRITIN 16 10/17/2021   FERRITIN 29 09/19/2021   IRONPCTSAT 26 11/13/2023   IRONPCTSAT 18 10/17/2021   IRONPCTSAT 26 07/10/2021   Lab Results  Component Value Date   VITAMINB12 265 12/18/2020   Lab Results  Component Value Date   FOLATE >20.0 11/13/2023   No results found for: "LDH"  STUDIES:  CUP PACEART REMOTE DEVICE CHECK Result Date: 12/01/2023 ILR summary report received. Battery status OK. Normal device function. No new symptom, tachy, brady, or pause episodes. No new AF episodes. Monthly summary reports and ROV/PRN. [facility]    HISTORY:   Past Medical History:  Diagnosis Date   Abdominal pain    Accelerating angina (HCC) 08/26/2019   Acute blood loss anemia 12/14/2019   Acute cardioembolic stroke (HCC) 11/30/2019   Allergic rhinitis 06/21/2014   Anxiety    Arthritis    Asthma    Atherosclerosis of arteries 08/24/2019   Bilateral edema of lower extremity    Body mass index (BMI) 25.0-25.9, adult 08/24/2019   CAD (coronary artery disease) 10/25/2019   Cellulitis of right leg    Chronic low back pain 01/27/2018   Combined hyperlipidemia 08/24/2019   Complication of anesthesia    difficulty waking   Congestive heart failure (CHF) (HCC)    COPD (chronic obstructive pulmonary disease) (HCC)    Coronary artery disease    Coronary artery disease involving native heart without angina pectoris    Dyspnea 02/09/2019   Dyspnea on exertion    Dysuria 09/19/2021   Embolic stroke (HCC) 11/30/2019   Essential (primary) hypertension 02/20/2021   Essential thrombocythemia (HCC) 06/22/2014   Essential thrombocytosis (HCC)  06/22/2014   Gastrointestinal bleeding 06/23/2018   GERD without esophagitis 06/21/2014   History of melanoma excision    left greast toe 2015   History of squamous cell carcinoma excision    face--  multiple excisions   History of subdural hematoma    2008   HTN (hypertension) 06/21/2014   Hypertension    Idiopathic scoliosis and kyphoscoliosis 01/27/2018   IMO SNOMED Dx Update Oct 2024     Ischial bursitis, right 08/24/2019   Leucocytosis 12/14/2019   Leukocytosis 12/14/2019  Malignant melanoma of great toe (HCC) 08/24/2019   Melanoma in situ (HCC) 06/29/2014   Mild mitral stenosis 09/20/2022   Mixed hyperlipidemia 08/24/2019   Opioid dependence (HCC) 08/24/2019   OSA (obstructive sleep apnea)    intolerant   Pedal edema 08/24/2019   Pneumonia of both lungs due to infectious organism 12/20/2020   Recurrent urinary tract infection 02/27/2023   Renal insufficiency 08/24/2019   S/P CABG x 4 11/04/2019   Skin cancer 08/24/2019   Solitary pulmonary nodule on lung CT 02/09/2019   Squamous cell carcinoma, scalp/neck 08/24/2019   Stroke (HCC) 12/21/2020   Stroke due to embolism (HCC) 11/25/2019   Subdural hematoma (HCC) 12/20/2020   Swelling of limb 08/24/2019   Traumatic subdural hemorrhage with loss of consciousness of unspecified duration, initial encounter (HCC) 02/20/2021   VIN III (vulvar intraepithelial neoplasia III)    Vitamin B 12 deficiency 08/24/2019   Vitamin D  deficiency 08/24/2019   Wears dentures    UPPER AND LOWER PARTIAL   Wears glasses     Past Surgical History:  Procedure Laterality Date   ABDOMINAL HYSTERECTOMY  age 25   CARDIAC CATHETERIZATION  03/172021   CORONARY ARTERY BYPASS GRAFT N/A 11/04/2019   Procedure: CORONARY ARTERY BYPASS GRAFTING (CABG) x 4, with ENDOSCOPIC HARVESTING OF RIGHT GREATER SAPHENOUS VEIN.;  Surgeon: Bartley Lightning, MD;  Location: [facility];  Service: Open Heart Surgery;  Laterality: N/A;   CORONARY ULTRASOUND/IVUS N/A 11/03/2019    Procedure: Intravascular Ultrasound/IVUS;  Surgeon: Sammy Crisp, MD;  Location: [facility] INVASIVE CV LAB;  Service: Cardiovascular;  Laterality: N/A;   KNEE ARTHROSCOPY Left 2004   LEFT HEART CATH AND CORONARY ANGIOGRAPHY N/A 11/03/2019   Procedure: LEFT HEART CATH AND CORONARY ANGIOGRAPHY;  Surgeon: Sammy Crisp, MD;  Location: [facility] INVASIVE CV LAB;  Service: Cardiovascular;  Laterality: N/A;   LOOP RECORDER INSERTION N/A 11/30/2019   Procedure: LOOP RECORDER INSERTION;  Surgeon: Jolly Needle, MD;  Location: [facility] INVASIVE CV LAB;  Service: Cardiovascular;  Laterality: N/A;   MELANOMA EXCISION  2015   left great toe   REPAIR PERONEAL TENDONS ANKLE  2004   SUBDURAL HEMATOMA EVACUATION VIA CRANIOTOMY  2008      week later  post-op  [facility]   TEE WITHOUT CARDIOVERSION N/A 11/04/2019   Procedure: TRANSESOPHAGEAL ECHOCARDIOGRAM (TEE);  Surgeon: Bartley Lightning, MD;  Location: [facility];  Service: Open Heart Surgery;  Laterality: N/A;   VULVECTOMY N/A 10/24/2015   Procedure: WIDE LOCAL EXCISION OF THE VULVA ;  Surgeon: Alphonso Aschoff, MD;  Location: [facility];  Service: Gynecology;  Laterality: N/A;    Family History  Problem Relation Age of Onset   Clotting disorder Mother    Hypertension Mother    Heart disease Mother    Arthritis Mother    Stroke Father    Hypertension Father    Heart disease Father    Arthritis Father    Emphysema Father    Asthma Father    Kidney failure Father    Hypertension Sister    Heart disease Sister    Arthritis Sister    Hypertension Brother    Heart disease Brother    Arthritis Brother     Social History:  reports that she has never smoked. She has never used smokeless tobacco. She reports that she does not drink alcohol and does not use drugs.The patient is alone today.  Allergies:  Allergies  Allergen Reactions   Cephalexin Hives   Other  Penicillin G    Ciprofloxacin  Other (See Comments)    Gi intolerance   Penicillins  Rash    Childhood reaction Did it involve swelling of the face/tongue/throat, SOB, or low BP? No Did it involve sudden or severe rash/hives, skin peeling, or any reaction on the inside of your mouth or nose? No Did you need to seek medical attention at a hospital or doctor's office? No When did it last happen?      50 years ago If all above answers are "NO", may proceed with cephalosporin use.     Current Medications: Current Outpatient Medications  Medication Sig Dispense Refill   Mirabegron (MYRBETRIQ PO) Take by mouth.     ammonium lactate (LAC-HYDRIN) 12 % lotion Apply 1 Application topically as needed for dry skin.     aspirin  EC 81 MG tablet Take 1 tablet (81 mg total) by mouth daily. Swallow whole. 90 tablet 3   atorvastatin  (LIPITOR ) 20 MG tablet TAKE 1 TABLET BY MOUTH EVERY DAY 90 tablet 3   b complex vitamins capsule Take 1 capsule by mouth daily.     fluticasone  (FLONASE ) 50 MCG/ACT nasal spray Place 2 sprays into both nostrils at bedtime.      fosfomycin (MONUROL) 3 g PACK SMARTSIG:3 Gram(s) By Mouth Once a Week     magnesium  oxide (MAG-OX) 400 MG tablet Take 400 mg by mouth daily.     meclizine (ANTIVERT) 12.5 MG tablet Take 12.5 mg by mouth 2 (two) times daily as needed for dizziness.     metoprolol  tartrate (LOPRESSOR ) 25 MG tablet TAKE 1 TABLET BY MOUTH TWICE A DAY 180 tablet 3   Multiple Vitamins-Minerals (PRESERVISION AREDS 2) CAPS Take 1 capsule by mouth in the morning and at bedtime.      omeprazole (PRILOSEC) 20 MG capsule Take 20 mg by mouth daily.     Trospium Chloride 60 MG CP24 Take 1 capsule by mouth daily.     Vitamin D , Ergocalciferol , (DRISDOL ) 1.25 MG (50000 UT) CAPS capsule Take 50,000 Units by mouth every 14 (fourteen) days.     No current facility-administered medications for this visit.     I,Jasmine M Lassiter,acting as a scribe for Nolia Baumgartner, MD.,have documented all relevant documentation on the behalf of Nolia Baumgartner, MD,as  directed by  Nolia Baumgartner, MD while in the presence of Nolia Baumgartner, MD.

## 2023-12-04 ENCOUNTER — Telehealth: Payer: Self-pay | Admitting: Oncology

## 2023-12-04 NOTE — Telephone Encounter (Signed)
 12/04/23 LVM next appt scheduled on 03/03/24 arrive at 3pm.

## 2023-12-16 ENCOUNTER — Ambulatory Visit: Payer: PPO | Admitting: Oncology

## 2023-12-16 ENCOUNTER — Other Ambulatory Visit: Payer: PPO

## 2023-12-22 DIAGNOSIS — H353132 Nonexudative age-related macular degeneration, bilateral, intermediate dry stage: Secondary | ICD-10-CM | POA: Diagnosis not present

## 2024-01-05 ENCOUNTER — Ambulatory Visit (INDEPENDENT_AMBULATORY_CARE_PROVIDER_SITE_OTHER): Payer: PPO

## 2024-01-05 DIAGNOSIS — I639 Cerebral infarction, unspecified: Secondary | ICD-10-CM

## 2024-01-05 LAB — CUP PACEART REMOTE DEVICE CHECK
Date Time Interrogation Session: 20250518230655
Implantable Pulse Generator Implant Date: 20210413

## 2024-01-07 ENCOUNTER — Ambulatory Visit: Payer: Self-pay | Admitting: Cardiology

## 2024-01-10 DIAGNOSIS — N952 Postmenopausal atrophic vaginitis: Secondary | ICD-10-CM | POA: Diagnosis not present

## 2024-01-10 DIAGNOSIS — R4182 Altered mental status, unspecified: Secondary | ICD-10-CM | POA: Diagnosis not present

## 2024-01-10 DIAGNOSIS — Z8673 Personal history of transient ischemic attack (TIA), and cerebral infarction without residual deficits: Secondary | ICD-10-CM | POA: Diagnosis not present

## 2024-01-10 DIAGNOSIS — N3091 Cystitis, unspecified with hematuria: Secondary | ICD-10-CM | POA: Diagnosis not present

## 2024-01-10 DIAGNOSIS — G9341 Metabolic encephalopathy: Secondary | ICD-10-CM | POA: Diagnosis not present

## 2024-01-10 DIAGNOSIS — N10 Acute tubulo-interstitial nephritis: Secondary | ICD-10-CM | POA: Diagnosis not present

## 2024-01-10 DIAGNOSIS — N368 Other specified disorders of urethra: Secondary | ICD-10-CM | POA: Diagnosis not present

## 2024-01-10 DIAGNOSIS — J439 Emphysema, unspecified: Secondary | ICD-10-CM | POA: Diagnosis not present

## 2024-01-10 DIAGNOSIS — N390 Urinary tract infection, site not specified: Secondary | ICD-10-CM | POA: Diagnosis not present

## 2024-01-10 DIAGNOSIS — Z85820 Personal history of malignant melanoma of skin: Secondary | ICD-10-CM | POA: Diagnosis not present

## 2024-01-10 DIAGNOSIS — R9089 Other abnormal findings on diagnostic imaging of central nervous system: Secondary | ICD-10-CM | POA: Diagnosis not present

## 2024-01-10 DIAGNOSIS — G9389 Other specified disorders of brain: Secondary | ICD-10-CM | POA: Diagnosis not present

## 2024-01-10 DIAGNOSIS — Z7982 Long term (current) use of aspirin: Secondary | ICD-10-CM | POA: Diagnosis not present

## 2024-01-10 DIAGNOSIS — A419 Sepsis, unspecified organism: Secondary | ICD-10-CM | POA: Diagnosis not present

## 2024-01-10 DIAGNOSIS — D631 Anemia in chronic kidney disease: Secondary | ICD-10-CM | POA: Diagnosis not present

## 2024-01-10 DIAGNOSIS — N1330 Unspecified hydronephrosis: Secondary | ICD-10-CM | POA: Diagnosis not present

## 2024-01-10 DIAGNOSIS — D696 Thrombocytopenia, unspecified: Secondary | ICD-10-CM | POA: Diagnosis not present

## 2024-01-10 DIAGNOSIS — I129 Hypertensive chronic kidney disease with stage 1 through stage 4 chronic kidney disease, or unspecified chronic kidney disease: Secondary | ICD-10-CM | POA: Diagnosis not present

## 2024-01-10 DIAGNOSIS — R161 Splenomegaly, not elsewhere classified: Secondary | ICD-10-CM | POA: Diagnosis not present

## 2024-01-10 DIAGNOSIS — D471 Chronic myeloproliferative disease: Secondary | ICD-10-CM | POA: Diagnosis not present

## 2024-01-10 DIAGNOSIS — I2510 Atherosclerotic heart disease of native coronary artery without angina pectoris: Secondary | ICD-10-CM | POA: Diagnosis not present

## 2024-01-10 DIAGNOSIS — Z8544 Personal history of malignant neoplasm of other female genital organs: Secondary | ICD-10-CM | POA: Diagnosis not present

## 2024-01-10 DIAGNOSIS — R32 Unspecified urinary incontinence: Secondary | ICD-10-CM | POA: Diagnosis not present

## 2024-01-10 DIAGNOSIS — D72829 Elevated white blood cell count, unspecified: Secondary | ICD-10-CM | POA: Diagnosis not present

## 2024-01-10 DIAGNOSIS — I6523 Occlusion and stenosis of bilateral carotid arteries: Secondary | ICD-10-CM | POA: Diagnosis not present

## 2024-01-10 DIAGNOSIS — D473 Essential (hemorrhagic) thrombocythemia: Secondary | ICD-10-CM | POA: Diagnosis not present

## 2024-01-10 DIAGNOSIS — N12 Tubulo-interstitial nephritis, not specified as acute or chronic: Secondary | ICD-10-CM | POA: Diagnosis not present

## 2024-01-10 DIAGNOSIS — N183 Chronic kidney disease, stage 3 unspecified: Secondary | ICD-10-CM | POA: Diagnosis not present

## 2024-01-10 DIAGNOSIS — E785 Hyperlipidemia, unspecified: Secondary | ICD-10-CM | POA: Diagnosis not present

## 2024-01-14 DIAGNOSIS — N12 Tubulo-interstitial nephritis, not specified as acute or chronic: Secondary | ICD-10-CM | POA: Diagnosis not present

## 2024-01-15 DIAGNOSIS — N12 Tubulo-interstitial nephritis, not specified as acute or chronic: Secondary | ICD-10-CM | POA: Diagnosis not present

## 2024-01-19 ENCOUNTER — Telehealth: Payer: Self-pay | Admitting: Oncology

## 2024-01-19 NOTE — Telephone Encounter (Signed)
 Patient has been scheduled for follow-up visit per 01/19/24 LOS.  Pt aware of scheduled appt details.

## 2024-01-21 NOTE — Progress Notes (Signed)
 [facility] Summary Report / Loop Recorder

## 2024-01-23 DIAGNOSIS — M159 Polyosteoarthritis, unspecified: Secondary | ICD-10-CM | POA: Diagnosis not present

## 2024-01-23 DIAGNOSIS — Z79899 Other long term (current) drug therapy: Secondary | ICD-10-CM | POA: Diagnosis not present

## 2024-01-23 DIAGNOSIS — Z8673 Personal history of transient ischemic attack (TIA), and cerebral infarction without residual deficits: Secondary | ICD-10-CM | POA: Diagnosis not present

## 2024-01-23 DIAGNOSIS — Z89419 Acquired absence of unspecified great toe: Secondary | ICD-10-CM | POA: Diagnosis not present

## 2024-01-23 DIAGNOSIS — E538 Deficiency of other specified B group vitamins: Secondary | ICD-10-CM | POA: Diagnosis not present

## 2024-01-23 DIAGNOSIS — Z8719 Personal history of other diseases of the digestive system: Secondary | ICD-10-CM | POA: Diagnosis not present

## 2024-01-23 DIAGNOSIS — I10 Essential (primary) hypertension: Secondary | ICD-10-CM | POA: Diagnosis not present

## 2024-01-23 DIAGNOSIS — I700 Atherosclerosis of aorta: Secondary | ICD-10-CM | POA: Diagnosis not present

## 2024-01-23 DIAGNOSIS — E785 Hyperlipidemia, unspecified: Secondary | ICD-10-CM | POA: Diagnosis not present

## 2024-01-23 DIAGNOSIS — E559 Vitamin D deficiency, unspecified: Secondary | ICD-10-CM | POA: Diagnosis not present

## 2024-01-23 DIAGNOSIS — Z09 Encounter for follow-up examination after completed treatment for conditions other than malignant neoplasm: Secondary | ICD-10-CM | POA: Diagnosis not present

## 2024-01-23 DIAGNOSIS — J449 Chronic obstructive pulmonary disease, unspecified: Secondary | ICD-10-CM | POA: Diagnosis not present

## 2024-01-23 DIAGNOSIS — K219 Gastro-esophageal reflux disease without esophagitis: Secondary | ICD-10-CM | POA: Diagnosis not present

## 2024-01-23 DIAGNOSIS — H539 Unspecified visual disturbance: Secondary | ICD-10-CM | POA: Diagnosis not present

## 2024-01-23 DIAGNOSIS — J45909 Unspecified asthma, uncomplicated: Secondary | ICD-10-CM | POA: Diagnosis not present

## 2024-01-23 LAB — LAB REPORT - SCANNED
A1c: 5.7
EGFR: 55

## 2024-02-05 ENCOUNTER — Ambulatory Visit (INDEPENDENT_AMBULATORY_CARE_PROVIDER_SITE_OTHER)

## 2024-02-05 ENCOUNTER — Ambulatory Visit: Payer: Self-pay | Admitting: Cardiology

## 2024-02-05 DIAGNOSIS — I639 Cerebral infarction, unspecified: Secondary | ICD-10-CM | POA: Diagnosis not present

## 2024-02-05 LAB — CUP PACEART REMOTE DEVICE CHECK
Date Time Interrogation Session: 20250618230825
Implantable Pulse Generator Implant Date: 20210413

## 2024-02-06 DIAGNOSIS — C4441 Basal cell carcinoma of skin of scalp and neck: Secondary | ICD-10-CM | POA: Diagnosis not present

## 2024-02-06 DIAGNOSIS — L570 Actinic keratosis: Secondary | ICD-10-CM | POA: Diagnosis not present

## 2024-02-06 DIAGNOSIS — C44321 Squamous cell carcinoma of skin of nose: Secondary | ICD-10-CM | POA: Diagnosis not present

## 2024-02-11 DIAGNOSIS — R410 Disorientation, unspecified: Secondary | ICD-10-CM | POA: Diagnosis not present

## 2024-02-11 DIAGNOSIS — D696 Thrombocytopenia, unspecified: Secondary | ICD-10-CM | POA: Diagnosis not present

## 2024-02-11 DIAGNOSIS — I10 Essential (primary) hypertension: Secondary | ICD-10-CM | POA: Diagnosis not present

## 2024-02-11 DIAGNOSIS — R300 Dysuria: Secondary | ICD-10-CM | POA: Diagnosis not present

## 2024-02-11 DIAGNOSIS — R531 Weakness: Secondary | ICD-10-CM | POA: Diagnosis not present

## 2024-02-11 DIAGNOSIS — B965 Pseudomonas (aeruginosa) (mallei) (pseudomallei) as the cause of diseases classified elsewhere: Secondary | ICD-10-CM | POA: Diagnosis not present

## 2024-02-11 DIAGNOSIS — R0689 Other abnormalities of breathing: Secondary | ICD-10-CM | POA: Diagnosis not present

## 2024-02-11 DIAGNOSIS — N3000 Acute cystitis without hematuria: Secondary | ICD-10-CM | POA: Diagnosis not present

## 2024-02-11 DIAGNOSIS — N390 Urinary tract infection, site not specified: Secondary | ICD-10-CM | POA: Diagnosis not present

## 2024-02-11 DIAGNOSIS — R509 Fever, unspecified: Secondary | ICD-10-CM | POA: Diagnosis not present

## 2024-02-11 DIAGNOSIS — R4182 Altered mental status, unspecified: Secondary | ICD-10-CM | POA: Diagnosis not present

## 2024-02-11 DIAGNOSIS — B3731 Acute candidiasis of vulva and vagina: Secondary | ICD-10-CM | POA: Diagnosis not present

## 2024-02-11 DIAGNOSIS — R9431 Abnormal electrocardiogram [ECG] [EKG]: Secondary | ICD-10-CM | POA: Diagnosis not present

## 2024-02-11 DIAGNOSIS — D509 Iron deficiency anemia, unspecified: Secondary | ICD-10-CM | POA: Diagnosis not present

## 2024-02-11 DIAGNOSIS — G9340 Encephalopathy, unspecified: Secondary | ICD-10-CM | POA: Diagnosis not present

## 2024-02-12 DIAGNOSIS — Z8673 Personal history of transient ischemic attack (TIA), and cerebral infarction without residual deficits: Secondary | ICD-10-CM | POA: Diagnosis not present

## 2024-02-12 DIAGNOSIS — B3731 Acute candidiasis of vulva and vagina: Secondary | ICD-10-CM | POA: Diagnosis not present

## 2024-02-12 DIAGNOSIS — I10 Essential (primary) hypertension: Secondary | ICD-10-CM | POA: Diagnosis not present

## 2024-02-12 DIAGNOSIS — R739 Hyperglycemia, unspecified: Secondary | ICD-10-CM | POA: Diagnosis not present

## 2024-02-12 DIAGNOSIS — Z951 Presence of aortocoronary bypass graft: Secondary | ICD-10-CM | POA: Diagnosis not present

## 2024-02-12 DIAGNOSIS — D509 Iron deficiency anemia, unspecified: Secondary | ICD-10-CM | POA: Diagnosis not present

## 2024-02-12 DIAGNOSIS — Z79899 Other long term (current) drug therapy: Secondary | ICD-10-CM | POA: Diagnosis not present

## 2024-02-12 DIAGNOSIS — Z7982 Long term (current) use of aspirin: Secondary | ICD-10-CM | POA: Diagnosis not present

## 2024-02-12 DIAGNOSIS — M1990 Unspecified osteoarthritis, unspecified site: Secondary | ICD-10-CM | POA: Diagnosis not present

## 2024-02-12 DIAGNOSIS — E785 Hyperlipidemia, unspecified: Secondary | ICD-10-CM | POA: Diagnosis not present

## 2024-02-12 DIAGNOSIS — R531 Weakness: Secondary | ICD-10-CM | POA: Diagnosis not present

## 2024-02-12 DIAGNOSIS — N3000 Acute cystitis without hematuria: Secondary | ICD-10-CM | POA: Diagnosis not present

## 2024-02-12 DIAGNOSIS — B965 Pseudomonas (aeruginosa) (mallei) (pseudomallei) as the cause of diseases classified elsewhere: Secondary | ICD-10-CM | POA: Diagnosis not present

## 2024-02-12 DIAGNOSIS — G9340 Encephalopathy, unspecified: Secondary | ICD-10-CM | POA: Diagnosis not present

## 2024-02-12 DIAGNOSIS — G9349 Other encephalopathy: Secondary | ICD-10-CM | POA: Diagnosis not present

## 2024-02-12 DIAGNOSIS — N390 Urinary tract infection, site not specified: Secondary | ICD-10-CM | POA: Diagnosis not present

## 2024-02-12 DIAGNOSIS — R4182 Altered mental status, unspecified: Secondary | ICD-10-CM | POA: Diagnosis not present

## 2024-02-12 DIAGNOSIS — R197 Diarrhea, unspecified: Secondary | ICD-10-CM | POA: Diagnosis not present

## 2024-02-12 DIAGNOSIS — Z89412 Acquired absence of left great toe: Secondary | ICD-10-CM | POA: Diagnosis not present

## 2024-02-12 DIAGNOSIS — R338 Other retention of urine: Secondary | ICD-10-CM | POA: Diagnosis not present

## 2024-02-12 DIAGNOSIS — D696 Thrombocytopenia, unspecified: Secondary | ICD-10-CM | POA: Diagnosis not present

## 2024-02-12 DIAGNOSIS — J449 Chronic obstructive pulmonary disease, unspecified: Secondary | ICD-10-CM | POA: Diagnosis not present

## 2024-02-12 DIAGNOSIS — Z87440 Personal history of urinary (tract) infections: Secondary | ICD-10-CM | POA: Diagnosis not present

## 2024-02-15 DIAGNOSIS — R000 Tachycardia, unspecified: Secondary | ICD-10-CM | POA: Diagnosis not present

## 2024-02-15 DIAGNOSIS — R0600 Dyspnea, unspecified: Secondary | ICD-10-CM | POA: Diagnosis not present

## 2024-02-25 NOTE — Progress Notes (Signed)
 [facility] Summary Report / Loop Recorder

## 2024-02-27 ENCOUNTER — Inpatient Hospital Stay: Attending: Oncology

## 2024-02-27 ENCOUNTER — Inpatient Hospital Stay: Admitting: Oncology

## 2024-02-27 DIAGNOSIS — Z7401 Bed confinement status: Secondary | ICD-10-CM | POA: Diagnosis not present

## 2024-02-27 DIAGNOSIS — R1311 Dysphagia, oral phase: Secondary | ICD-10-CM | POA: Diagnosis not present

## 2024-02-27 DIAGNOSIS — R41841 Cognitive communication deficit: Secondary | ICD-10-CM | POA: Diagnosis not present

## 2024-02-27 DIAGNOSIS — M6259 Muscle wasting and atrophy, not elsewhere classified, multiple sites: Secondary | ICD-10-CM | POA: Diagnosis not present

## 2024-02-27 DIAGNOSIS — R2681 Unsteadiness on feet: Secondary | ICD-10-CM | POA: Diagnosis not present

## 2024-02-27 DIAGNOSIS — R531 Weakness: Secondary | ICD-10-CM | POA: Diagnosis not present

## 2024-02-27 NOTE — Progress Notes (Incomplete)
 [facility]  [address] [place],  [place]  [address] [phone]  Clinic Day: 02/27/24   Referring physician: Gable Cambric, MD  ASSESSMENT & PLAN:  Assessment: Essential thrombocythemia [facility]) Essential thrombocythemia previously treated with hydroxyurea .  Due to low normal platelets, hydroxyurea  was discontinued in October 2021.  She has persistent leukocytosis/neutrophilia, which she has had on and off since 2018.  This is felt to be due to her myeloproliferative disease.  Her counts remain stable, so we recommend continued observation.  We are seeing metamyelocytes and myelocytes but her blood counts remain relatively stable. We will not pursue that since she is too frail to consider any more aggressive treatment.  Recurrent urinary tract infections She has persistent symptoms of UTI despite recent treatment with nitrofurantoin .  I encouraged the patient to try the Azo-pyridium over the counter and at least some of the UTI prevention recommendations. She is now on Mybetriq for her urinary incontinence.   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 She continues to see the dermatologist regularly.   History of Vulvar Cancer This has been treated and she has no evidence of disease at last check.   TIA/Recurrent Strokes She knows to stay on Aspirin  and would probably benefit from more anticoagulation, but I feel this is risky in view of her frequent falls.   Plan: She had labs done at her PCP office in March which revealed an elevated WBC of 16.4, low hemoglobin of 7 but had improved to 10.2 on repeat, and low platelet count of 138,000. Her low hemoglobin was thought to be a lab error so she was referred to follow-up with me just in case. Her B-12, folate, and iron studies were all normal. She has a history of essential thrombocythemia and has been off hydroxyurea  since October, 2021. 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 Patient states that she feels well but complains of back pain rating 5/10. She has a WBC of 10.3, stable low hemoglobin of 10.7, and a low platelet count of 117,000. Her CMP is normal and I will see her back in 3 months with CBC and CMP. The patient understands the plans discussed today and is in agreement with them. She knows to contact our office if she develops concerns prior to her next appointment.  I provided 26 minutes of face-to-face time during this encounter and > 50% was spent counseling as documented under my assessment and plan.   [name], MD  [facility] A DEPT OF [facility] [address] [place] [address] Dept: [phone] Dept Fax: [phone]   No orders of the defined types were placed in this encounter.   CHIEF COMPLAINT:  CC: Essential thrombocythemia  Current Treatment: Surveillance  HISTORY OF PRESENT ILLNESS:  NICHOEL DIGIULIO is a 83 year old female with a history of essential thrombocythemia originally diagnosed in 98.  She was treated with hydroxyurea  500 mg 3 times daily.  We had to occasionally adjust doses because of leukopenia or anemia.  She also had a malignant melanoma of her left first toe resected in November 2015 with partial amputation of the toe and has done well.  She has had multiple other skin cancers removed over the years.  In March 2017, she was seen by Dr. 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  Hydroxyurea  was decreased to 500 mg twice daily prior to surgery, as we did not feel we could safely hold hydroxyurea .  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  Bone density scan in March 2018 revealed osteopenia with a T-score of -1.6 in the femur, for which she is on calcium  and  vitamin-D.  She has had chronic back pain, which is attributed to severe degenerative disease.  She had an MRI of the lumbar spine in March 2018, which revealed significant degenerative disc disease with mild spinal stenosis at L1 and L4, but moderate at L2-3.  Dr. Mindi has given her injections, as well at hydrocodone/APAP 5/325.  She has had intermittent leukocytosis since 2018, felt to be secondary to her essential thrombocythemia. She was evaluated in the emergency department in October 2019 and had a GI bleed. CT abdomen and pelvis revealed acute uncomplicated diverticulitis of the sigmoid colon with no other acute abnormalities.  A moderate to large hiatal hernia was seen. She saw Dr. Edwyna and underwent nuclear med stress test in November, which did not reveal any evidence of inducible ischemia or other abnormality.  Left ventricular size and function was normal with an ejection fraction of 79%.  She underwent EGD and colonoscopy in December 2019, and no significant source of blood loss was identified. She was instructed to continue lansoprazole.  She had dilatation of an esophageal stricture and removal of a polyp. Pathology revealed a fragmented tubular adenoma.    In January 2020, she had worsening anemia, in addition to continued episodes of chest pain and worsening dyspnea with exertion at her routine follow-up. Due to the severe dyspnea and right lower extremity edema, we obtained a CT angiogram chest, which did not reveal any evidence of pulmonary embolism.  There was good opacification of the pulmonary arteries.  Diffuse coronary artery calcifications with cardiomegaly and moderate thoracic aortic atherosclerosis was seen.  There was a 3.6 mm noncalcified nodule in the anterior inferior right upper lobe. Right lower extremity venous Doppler ultrasound did not reveal any evidence of deep venous thrombosis. In February 2020, she had persistent anemia with a hemoglobin of 9.7. As she was  symptomatic, hydroxyurea  500 mg was decreased to once daily.  Soluble transferrin was elevated, which was consistent with iron deficiency.  She saw Dr. Larene and he placed her on iron supplementation daily in the form of ferrous sulfate 65 mg. She was seen again in March and had improvement in her anemia with a hemoglobin of 10.2.  Her thrombocythemia remained controlled on hydroxyurea  500 mg daily. We encouraged her to have follow-up for her vulvar cancer, so followed up with Dr. Derry Bunker and her exam was normal.     She had a bilateral screening mammogram in February 2021, which revealed an area of calcifications in the right breast warranting further evaluation.  A diagnostic right mammogram was ordered, but this was delayed, because she underwent open heart surgery with CABG x4 in March.  She had a monitor placed in the left chest wall the time of surgery.  She did not feel she can have mammogram due to the monitor in the left chest wall.  She then had 2 strokes following her surgery, from which she has recovered well, except for balance issues and left eye blindness.  She ambulates with a cane or walker depending on her circumstances.  She is on clopidogrel  75 mg daily, atorvastatin  80 mg  daily, metoprolol  25 mg twice daily and pantoprazole  40 mg daily. She had a CT chest in July to follow-up on the pulmonary nodule which were stable. She finally underwent diagnostic right mammogram from July which revealed likely benign 6 mm group of calcifications involving the upper inner quadrant at middle depth. Bilateral diagnostic mammogram in February 2022 was recommended  At her visit in October 2021, she reported elevated transaminases and purpura, which was attributed to atorvastatin .  Dr. Revankar reduced the dose from 80 mg to 40 mg with normalization of the transaminases and no further purpura.  Her platelets were in low normal range, so hydroxyurea  was held and since her platelets have remained  normal, hydroxyurea  was discontinued. She has had intermittent mild leukocytosis/neutrophilia since 2018.  She has occasionally had immature cells on her blood smear, but not blasts.    When I saw her in May 2022, she had worsening leukocytosis with persistent immature cells and worsening anemia, so I recommended a bone marrow biopsy, but this was never done as the patient was hospitalized with a stroke and discharged to rehab. She was seen again in August, at which time her white count had decreased from 24.2 to 19.9 with an ANC of 1473, with 11% lymphocytes, 10% monocytes and 2.4 % basophils and her hemoglobin had improved from 10.6 to 11.3. The platelet count remained normal, so continued observation was recommended.  Her blood counts were stable in November.   She was admitted in January 2023 with a 3 day history of sensation of an object stuck in her throat following attempted swallowing of calcium  supplementation. She was eventually able to dislodge item after several hours.  She then developed stool that she described as motor oil for 2 days. She was evaluated in the emergency room and was found to have hemoglobin of 8.3, decreased from 11.4 in November 2022.   She underwent urgent EGD and was found to have a 3 cm mucosal tear within the distal 5 cm of her esophagus, felt to be most likely secondary to mucosal tear following calcium  carbonate impaction. CT neck and chest was performed to evaluate for possibility of mediastinitis developing following possible full focus mucosal tear. This imaging did not reveal active inflammatory changes. 3 clips were placed closing the tear successfully and complete hemostasis was achieved.  She was placed on twice daily pantoprazole  with slow advancement of diet from clear liquid to house select over 2 days.  She had an acute drop in hemoglobin to 6.9 following further equilibration, so received 1 unit PRBC with appropriate response to 8.2 g/dL.Her hemoglobin later  improved.  Review of her blood smear in March revealed occasional myelocytes, occasional nucleated RBC's, poikilocytosis, and 1 promyelocyte. We have been concerned about transformation to leukemia, with the changes on peripheral smear.  Her white blood cell count has been running between 16.5 and 24.1, her hemoglobin has been running between 10.2 and 11.7 and platelets running between 108,000 and 129,000 for the past year.  As her counts have been fairly stable, we have continued observation.  Bilateral diagnostic mammogram in January did not reveal any evidence of malignancy, screening mammogram in 1 year was recommended.  INTERVAL HISTORY:  Eunie is here today for an added on appointment due to abnormal labs. She had labs done at her PCP office in March which revealed an elevated WBC of 16.4, low hemoglobin of 7 but had improved to 10.2 on repeat, and low platelet count of 138,000. Her low hemoglobin was  thought to be a lab error but she was referred to follow-up with me just in case. Her B-12, folate, and iron studies were all normal. She has a history of essential thrombocythemia and has been off hydroxyurea  since October, 2021. I think she has evolved into myelofibrosis and does have occasional immature granulocytes. Patient states that she feels *** and ***.      She denies fever, chills, night sweats, or other signs of infection. She denies cardiorespiratory and gastrointestinal issues. She  denies pain. Her appetite is *** and Her weight {Weight change:10426}.  Patient states that she feels well but complains of back pain rating 5/10. She has a WBC of 10.3, stable low hemoglobin of 10.7, and a low platelet count of 117,000. Her CMP is normal and I will see her back in 3 months with CBC and CMP.   She denies signs of infection such as sore throat, sinus drainage, cough, or urinary symptoms.  She denies fevers or recurrent chills. She denies nausea, vomiting, chest pain, dyspnea or cough. Her  appetite is good and her weight has decreased 7 pounds over last 4 months.   REVIEW OF SYSTEMS:  Review of Systems  Constitutional:  Positive for fatigue. Negative for appetite change, chills, fever and unexpected weight change.  HENT:   Positive for hearing loss. Negative for lump/mass, mouth sores, nosebleeds, sore throat, tinnitus, trouble swallowing and voice change.   Eyes: Negative.  Negative for eye problems and icterus.  Respiratory: Negative.  Negative for chest tightness, cough, hemoptysis, shortness of breath and wheezing.   Cardiovascular: Negative.  Negative for chest pain, leg swelling and palpitations.  Gastrointestinal: Negative.  Negative for abdominal distention, abdominal pain, blood in stool, constipation, diarrhea, nausea, rectal pain and vomiting.  Endocrine: Negative.  Negative for hot flashes.  Genitourinary:  Negative for bladder incontinence, difficulty urinating, dyspareunia, dysuria, frequency, hematuria, menstrual problem, nocturia, pelvic pain, vaginal bleeding and vaginal discharge.   Musculoskeletal:  Positive for back pain (7/10) and gait problem (Due to previous CVA, using cane). Negative for arthralgias, flank pain, myalgias, neck pain and neck stiffness.  Skin: Negative.  Negative for itching, rash and wound.  Neurological:  Positive for extremity weakness and gait problem (Due to previous CVA, using cane). Negative for dizziness, headaches, light-headedness, numbness, seizures and speech difficulty.  Hematological: Negative.  Negative for adenopathy. Does not bruise/bleed easily.  Psychiatric/Behavioral: Negative.  Negative for confusion, decreased concentration, depression, sleep disturbance and suicidal ideas. The patient is not nervous/anxious.      VITALS:  There were no vitals taken for this visit.  Wt Readings from Last 3 Encounters:  12/03/23 136 lb (61.7 kg)  09/17/23 143 lb 4.8 oz (65 kg)  07/15/23 141 lb 3.2 oz (64 kg)    There is no height or  weight on file to calculate BMI.  Performance status (ECOG): 1 - Symptomatic but completely ambulatory  PHYSICAL EXAM:  Physical Exam Vitals and nursing note reviewed.  Constitutional:      General: She is not in acute distress.    Appearance: Normal appearance. She is normal weight. She is not ill-appearing, toxic-appearing or diaphoretic.  HENT:     Head: Normocephalic and atraumatic.     Right Ear: Tympanic membrane, ear canal and external ear normal. Decreased hearing noted. There is no impacted cerumen.     Left Ear: Tympanic membrane, ear canal and external ear normal. Decreased hearing noted. There is no impacted cerumen.     Nose: Nose normal.  No congestion or rhinorrhea.     Mouth/Throat:     Mouth: Mucous membranes are moist.     Pharynx: Oropharynx is clear. No oropharyngeal exudate or posterior oropharyngeal erythema.  Eyes:     General: No scleral icterus.       Right eye: No discharge.        Left eye: No discharge.     Extraocular Movements: Extraocular movements intact.     Conjunctiva/sclera: Conjunctivae normal.     Pupils: Pupils are equal, round, and reactive to light.  Cardiovascular:     Rate and Rhythm: Normal rate and regular rhythm.     Pulses: Normal pulses.     Heart sounds: Murmur heard.     Systolic murmur is present with a grade of 2/6.     No friction rub. No gallop.  Pulmonary:     Effort: Pulmonary effort is normal. No respiratory distress.     Breath sounds: Normal breath sounds. No wheezing, rhonchi or rales.  Chest:     Comments: Bilateral breasts are without masses.  Abdominal:     General: Bowel sounds are normal. There is no distension.     Palpations: Abdomen is soft. There is no hepatomegaly, splenomegaly or mass.  Musculoskeletal:        General: Normal range of motion.     Cervical back: Normal range of motion and neck supple. No tenderness.     Right lower leg: No edema.     Left lower leg: No edema.  Lymphadenopathy:      Cervical: No cervical adenopathy.     Right cervical: No superficial, deep or posterior cervical adenopathy.    Left cervical: No superficial, deep or posterior cervical adenopathy.     Upper Body:     Right upper body: No supraclavicular, axillary or pectoral adenopathy.     Left upper body: No supraclavicular, axillary or pectoral adenopathy.     Lower Body: No right inguinal adenopathy. No left inguinal adenopathy.  Skin:    General: Skin is warm and dry.     Coloration: Skin is not jaundiced.     Findings: No bruising, ecchymosis, lesion or rash.  Neurological:     General: No focal deficit present.     Mental Status: She is alert and oriented to person, place, and time. Mental status is at baseline.     Cranial Nerves: No cranial nerve deficit.     Sensory: No sensory deficit.     Motor: No weakness.     Coordination: Coordination normal.     Gait: Gait normal.  Psychiatric:        Mood and Affect: Mood normal.        Behavior: Behavior normal.        Thought Content: Thought content normal.        Judgment: Judgment normal.    LABS:      Latest Ref Rng & Units 12/03/2023    3:47 PM 11/13/2023   12:00 AM 09/17/2023   12:49 PM  CBC  WBC 4.0 - 10.5 K/uL 10.3  16.4     11.1   Hemoglobin 12.0 - 15.0 g/dL 89.2  89.7     89.5   Hematocrit 36.0 - 46.0 % 34.5  32     31.4   Platelets 150 - 400 K/uL 117  138     104      This result is from an external source.      Latest  Ref Rng & Units 12/03/2023    3:47 PM 09/17/2023   12:49 PM 06/17/2023    1:11 PM  CMP  Glucose 70 - 99 mg/dL 890  91  893   BUN 8 - 23 mg/dL 14  15  18    Creatinine 0.44 - 1.00 mg/dL 9.02  9.03  9.28   Sodium 135 - 145 mmol/L 142  143  138   Potassium 3.5 - 5.1 mmol/L 4.3  3.9  3.7   Chloride 98 - 111 mmol/L 106  105  104   CO2 22 - 32 mmol/L 25  25  23    Calcium  8.9 - 10.3 mg/dL 9.0  9.1  8.8   Total Protein 6.5 - 8.1 g/dL 7.8  7.6  7.4   Total Bilirubin 0.0 - 1.2 mg/dL 0.3  0.4  0.9   Alkaline Phos  38 - 126 U/L 73  75  47   AST 15 - 41 U/L 24  26  18    ALT 0 - 44 U/L 13  16  16     No results found for: CEA1, CEA / No results found for: CEA1, CEA No results found for: PSA1 No results found for: CAN199 No results found for: CAN125  No results found for: STEPHANY CARLOTA BENSON MARKEL EARLA JOANNIE DOC, MSPIKE, SPEI Lab Results  Component Value Date   TIBC 351 11/13/2023   TIBC 484 (H) 10/17/2021   TIBC 358 07/10/2021   FERRITIN 43 11/13/2023   FERRITIN 16 10/17/2021   FERRITIN 29 09/19/2021   IRONPCTSAT 26 11/13/2023   IRONPCTSAT 18 10/17/2021   IRONPCTSAT 26 07/10/2021   Lab Results  Component Value Date   VITAMINB12 265 12/18/2020   Lab Results  Component Value Date   FOLATE >20.0 11/13/2023   No results found for: LDH  STUDIES:  CUP PACEART REMOTE DEVICE CHECK Result Date: 02/05/2024 ILR summary report received. Battery status OK. Normal device function. No new symptom, tachy, brady, or pause episodes. No new AF episodes. Monthly summary reports and ROV/PRN LA, CVRS    HISTORY:   Past Medical History:  Diagnosis Date   Abdominal pain    Accelerating angina (HCC) 08/26/2019   Acute blood loss anemia 12/14/2019   Acute cardioembolic stroke (HCC) 11/30/2019   Allergic rhinitis 06/21/2014   Anxiety    Arthritis    Asthma    Atherosclerosis of arteries 08/24/2019   Bilateral edema of lower extremity    Body mass index (BMI) 25.0-25.9, adult 08/24/2019   CAD (coronary artery disease) 10/25/2019   Cellulitis of right leg    Chronic low back pain 01/27/2018   Combined hyperlipidemia 08/24/2019   Complication of anesthesia    difficulty waking   Congestive heart failure (CHF) (HCC)    COPD (chronic obstructive pulmonary disease) (HCC)    Coronary artery disease    Coronary artery disease involving native heart without angina pectoris    Dyspnea 02/09/2019   Dyspnea on exertion    Dysuria 09/19/2021   Embolic  stroke (HCC) 11/30/2019   Essential (primary) hypertension 02/20/2021   Essential thrombocythemia (HCC) 06/22/2014   Essential thrombocytosis (HCC) 06/22/2014   Gastrointestinal bleeding 06/23/2018   GERD without esophagitis 06/21/2014   History of melanoma excision    left greast toe 2015   History of squamous cell carcinoma excision    face--  multiple excisions   History of subdural hematoma    2008   HTN (hypertension) 06/21/2014   Hypertension    Idiopathic  scoliosis and kyphoscoliosis 01/27/2018   IMO SNOMED Dx Update Oct 2024     Ischial bursitis, right 08/24/2019   Leucocytosis 12/14/2019   Leukocytosis 12/14/2019   Malignant melanoma of great toe (HCC) 08/24/2019   Melanoma in situ (HCC) 06/29/2014   Mild mitral stenosis 09/20/2022   Mixed hyperlipidemia 08/24/2019   Opioid dependence (HCC) 08/24/2019   OSA (obstructive sleep apnea)    intolerant   Pedal edema 08/24/2019   Pneumonia of both lungs due to infectious organism 12/20/2020   Recurrent urinary tract infection 02/27/2023   Renal insufficiency 08/24/2019   S/P CABG x 4 11/04/2019   Skin cancer 08/24/2019   Solitary pulmonary nodule on lung CT 02/09/2019   Squamous cell carcinoma, scalp/neck 08/24/2019   Stroke (HCC) 12/21/2020   Stroke due to embolism (HCC) 11/25/2019   Subdural hematoma (HCC) 12/20/2020   Swelling of limb 08/24/2019   Traumatic subdural hemorrhage with loss of consciousness of unspecified duration, initial encounter (HCC) 02/20/2021   VIN III (vulvar intraepithelial neoplasia III)    Vitamin B 12 deficiency 08/24/2019   Vitamin D  deficiency 08/24/2019   Wears dentures    UPPER AND LOWER PARTIAL   Wears glasses     Past Surgical History:  Procedure Laterality Date   ABDOMINAL HYSTERECTOMY  age 47   CARDIAC CATHETERIZATION  03/172021   CORONARY ARTERY BYPASS GRAFT N/A 11/04/2019   Procedure: CORONARY ARTERY BYPASS GRAFTING (CABG) x 4, with ENDOSCOPIC HARVESTING OF RIGHT GREATER  SAPHENOUS VEIN.;  Surgeon: Lucas Dorise POUR, MD;  Location: [facility] OR;  Service: Open Heart Surgery;  Laterality: N/A;   CORONARY ULTRASOUND/IVUS N/A 11/03/2019   Procedure: Intravascular Ultrasound/IVUS;  Surgeon: Mady Bruckner, MD;  Location: [facility] INVASIVE CV LAB;  Service: Cardiovascular;  Laterality: N/A;   KNEE ARTHROSCOPY Left 2004   LEFT HEART CATH AND CORONARY ANGIOGRAPHY N/A 11/03/2019   Procedure: LEFT HEART CATH AND CORONARY ANGIOGRAPHY;  Surgeon: Mady Bruckner, MD;  Location: [facility] INVASIVE CV LAB;  Service: Cardiovascular;  Laterality: N/A;   LOOP RECORDER INSERTION N/A 11/30/2019   Procedure: LOOP RECORDER INSERTION;  Surgeon: Kelsie Agent, MD;  Location: [facility] INVASIVE CV LAB;  Service: Cardiovascular;  Laterality: N/A;   MELANOMA EXCISION  2015   left great toe   REPAIR PERONEAL TENDONS ANKLE  2004   SUBDURAL HEMATOMA EVACUATION VIA CRANIOTOMY  2008      week later  post-op  [facility]   TEE WITHOUT CARDIOVERSION N/A 11/04/2019   Procedure: TRANSESOPHAGEAL ECHOCARDIOGRAM (TEE);  Surgeon: Lucas Dorise POUR, MD;  Location: [facility] OR;  Service: Open Heart Surgery;  Laterality: N/A;   VULVECTOMY N/A 10/24/2015   Procedure: WIDE LOCAL EXCISION OF THE VULVA ;  Surgeon: Maurilio Ship, MD;  Location: [facility] ;  Service: Gynecology;  Laterality: N/A;    Family History  Problem Relation Age of Onset   Clotting disorder Mother    Hypertension Mother    Heart disease Mother    Arthritis Mother    Stroke Father    Hypertension Father    Heart disease Father    Arthritis Father    Emphysema Father    Asthma Father    Kidney failure Father    Hypertension Sister    Heart disease Sister    Arthritis Sister    Hypertension Brother    Heart disease Brother    Arthritis Brother     Social History:  reports that she has never smoked. She has never used smokeless tobacco. She reports  that she does not drink alcohol and does not use drugs.The patient is alone today.  Allergies:   Allergies  Allergen Reactions   Cephalexin Hives   Other    Penicillin G    Ciprofloxacin  Other (See Comments)    Gi intolerance   Penicillins Rash    Childhood reaction Did it involve swelling of the face/tongue/throat, SOB, or low BP? No Did it involve sudden or severe rash/hives, skin peeling, or any reaction on the inside of your mouth or nose? No Did you need to seek medical attention at a hospital or doctor's office? No When did it last happen?      50 years ago If all above answers are "NO", may proceed with cephalosporin use.     Current Medications: Current Outpatient Medications  Medication Sig Dispense Refill   ammonium lactate (LAC-HYDRIN) 12 % lotion Apply 1 Application topically as needed for dry skin.     aspirin  EC 81 MG tablet Take 1 tablet (81 mg total) by mouth daily. Swallow whole. 90 tablet 3   atorvastatin  (LIPITOR ) 20 MG tablet TAKE 1 TABLET BY MOUTH EVERY DAY 90 tablet 3   b complex vitamins capsule Take 1 capsule by mouth daily.     fluticasone  (FLONASE ) 50 MCG/ACT nasal spray Place 2 sprays into both nostrils at bedtime.      fosfomycin (MONUROL) 3 g PACK SMARTSIG:3 Gram(s) By Mouth Once a Week     magnesium  oxide (MAG-OX) 400 MG tablet Take 400 mg by mouth daily.     meclizine (ANTIVERT) 12.5 MG tablet Take 12.5 mg by mouth 2 (two) times daily as needed for dizziness.     metoprolol  tartrate (LOPRESSOR ) 25 MG tablet TAKE 1 TABLET BY MOUTH TWICE A DAY 180 tablet 3   Mirabegron (MYRBETRIQ PO) Take by mouth.     Multiple Vitamins-Minerals (PRESERVISION AREDS 2) CAPS Take 1 capsule by mouth in the morning and at bedtime.      omeprazole (PRILOSEC) 20 MG capsule Take 20 mg by mouth daily.     Trospium Chloride 60 MG CP24 Take 1 capsule by mouth daily.     Vitamin D , Ergocalciferol , (DRISDOL ) 1.25 MG (50000 UT) CAPS capsule Take 50,000 Units by mouth every 14 (fourteen) days.     No current facility-administered medications for this visit.     I,Jasmine M  Lassiter,acting as a scribe for [name], MD.,have documented all relevant documentation on the behalf of [name], MD,as directed by  [name], MD while in the presence of [name], MD.

## 2024-03-01 ENCOUNTER — Telehealth: Payer: Self-pay | Admitting: Oncology

## 2024-03-01 NOTE — Telephone Encounter (Signed)
 03/01/24 Patient is in Rehab and will call back when discharged.

## 2024-03-02 DIAGNOSIS — R262 Difficulty in walking, not elsewhere classified: Secondary | ICD-10-CM | POA: Diagnosis not present

## 2024-03-02 DIAGNOSIS — R339 Retention of urine, unspecified: Secondary | ICD-10-CM | POA: Diagnosis not present

## 2024-03-02 DIAGNOSIS — N390 Urinary tract infection, site not specified: Secondary | ICD-10-CM | POA: Diagnosis not present

## 2024-03-02 DIAGNOSIS — M6250 Muscle wasting and atrophy, not elsewhere classified, unspecified site: Secondary | ICD-10-CM | POA: Diagnosis not present

## 2024-03-03 ENCOUNTER — Ambulatory Visit: Admitting: Oncology

## 2024-03-03 ENCOUNTER — Other Ambulatory Visit

## 2024-03-03 DIAGNOSIS — Z8673 Personal history of transient ischemic attack (TIA), and cerebral infarction without residual deficits: Secondary | ICD-10-CM | POA: Diagnosis not present

## 2024-03-03 DIAGNOSIS — N390 Urinary tract infection, site not specified: Secondary | ICD-10-CM | POA: Diagnosis not present

## 2024-03-03 DIAGNOSIS — R262 Difficulty in walking, not elsewhere classified: Secondary | ICD-10-CM | POA: Diagnosis not present

## 2024-03-03 DIAGNOSIS — R339 Retention of urine, unspecified: Secondary | ICD-10-CM | POA: Diagnosis not present

## 2024-03-05 DIAGNOSIS — Z130 Encounter for screening for diseases of the blood and blood-forming organs and certain disorders involving the immune mechanism: Secondary | ICD-10-CM | POA: Diagnosis not present

## 2024-03-05 DIAGNOSIS — I10 Essential (primary) hypertension: Secondary | ICD-10-CM | POA: Diagnosis not present

## 2024-03-08 ENCOUNTER — Ambulatory Visit

## 2024-03-08 DIAGNOSIS — I639 Cerebral infarction, unspecified: Secondary | ICD-10-CM | POA: Diagnosis not present

## 2024-03-10 DIAGNOSIS — N390 Urinary tract infection, site not specified: Secondary | ICD-10-CM | POA: Diagnosis not present

## 2024-03-10 LAB — CUP PACEART REMOTE DEVICE CHECK
Date Time Interrogation Session: 20250720230304
Implantable Pulse Generator Implant Date: 20210413

## 2024-03-11 DIAGNOSIS — N3020 Other chronic cystitis without hematuria: Secondary | ICD-10-CM | POA: Diagnosis not present

## 2024-03-11 DIAGNOSIS — R338 Other retention of urine: Secondary | ICD-10-CM | POA: Diagnosis not present

## 2024-03-15 ENCOUNTER — Ambulatory Visit: Payer: Self-pay | Admitting: Cardiology

## 2024-03-17 DIAGNOSIS — I10 Essential (primary) hypertension: Secondary | ICD-10-CM | POA: Diagnosis not present

## 2024-03-23 DIAGNOSIS — R339 Retention of urine, unspecified: Secondary | ICD-10-CM | POA: Diagnosis not present

## 2024-03-25 DIAGNOSIS — R339 Retention of urine, unspecified: Secondary | ICD-10-CM | POA: Diagnosis not present

## 2024-03-25 DIAGNOSIS — J449 Chronic obstructive pulmonary disease, unspecified: Secondary | ICD-10-CM | POA: Diagnosis not present

## 2024-03-25 DIAGNOSIS — R262 Difficulty in walking, not elsewhere classified: Secondary | ICD-10-CM | POA: Diagnosis not present

## 2024-03-25 DIAGNOSIS — N390 Urinary tract infection, site not specified: Secondary | ICD-10-CM | POA: Diagnosis not present

## 2024-03-30 DIAGNOSIS — I119 Hypertensive heart disease without heart failure: Secondary | ICD-10-CM | POA: Diagnosis not present

## 2024-03-30 DIAGNOSIS — I10 Essential (primary) hypertension: Secondary | ICD-10-CM | POA: Diagnosis not present

## 2024-03-30 DIAGNOSIS — I2510 Atherosclerotic heart disease of native coronary artery without angina pectoris: Secondary | ICD-10-CM | POA: Diagnosis not present

## 2024-03-30 DIAGNOSIS — J440 Chronic obstructive pulmonary disease with acute lower respiratory infection: Secondary | ICD-10-CM | POA: Diagnosis not present

## 2024-03-30 DIAGNOSIS — J181 Lobar pneumonia, unspecified organism: Secondary | ICD-10-CM | POA: Diagnosis not present

## 2024-03-30 DIAGNOSIS — D509 Iron deficiency anemia, unspecified: Secondary | ICD-10-CM | POA: Diagnosis not present

## 2024-03-30 DIAGNOSIS — K5790 Diverticulosis of intestine, part unspecified, without perforation or abscess without bleeding: Secondary | ICD-10-CM | POA: Diagnosis not present

## 2024-03-30 DIAGNOSIS — E785 Hyperlipidemia, unspecified: Secondary | ICD-10-CM | POA: Diagnosis not present

## 2024-03-30 DIAGNOSIS — Z9181 History of falling: Secondary | ICD-10-CM | POA: Diagnosis not present

## 2024-03-30 DIAGNOSIS — J449 Chronic obstructive pulmonary disease, unspecified: Secondary | ICD-10-CM | POA: Diagnosis not present

## 2024-03-30 DIAGNOSIS — B965 Pseudomonas (aeruginosa) (mallei) (pseudomallei) as the cause of diseases classified elsewhere: Secondary | ICD-10-CM | POA: Diagnosis not present

## 2024-03-30 DIAGNOSIS — N136 Pyonephrosis: Secondary | ICD-10-CM | POA: Diagnosis not present

## 2024-03-30 DIAGNOSIS — G9340 Encephalopathy, unspecified: Secondary | ICD-10-CM | POA: Diagnosis not present

## 2024-03-30 DIAGNOSIS — D696 Thrombocytopenia, unspecified: Secondary | ICD-10-CM | POA: Diagnosis not present

## 2024-03-30 DIAGNOSIS — Z951 Presence of aortocoronary bypass graft: Secondary | ICD-10-CM | POA: Diagnosis not present

## 2024-03-30 DIAGNOSIS — Z8673 Personal history of transient ischemic attack (TIA), and cerebral infarction without residual deficits: Secondary | ICD-10-CM | POA: Diagnosis not present

## 2024-03-30 DIAGNOSIS — G9341 Metabolic encephalopathy: Secondary | ICD-10-CM | POA: Diagnosis not present

## 2024-03-30 DIAGNOSIS — N390 Urinary tract infection, site not specified: Secondary | ICD-10-CM | POA: Diagnosis not present

## 2024-03-30 DIAGNOSIS — Z792 Long term (current) use of antibiotics: Secondary | ICD-10-CM | POA: Diagnosis not present

## 2024-03-30 DIAGNOSIS — M21371 Foot drop, right foot: Secondary | ICD-10-CM | POA: Diagnosis not present

## 2024-03-30 DIAGNOSIS — R339 Retention of urine, unspecified: Secondary | ICD-10-CM | POA: Diagnosis not present

## 2024-03-30 DIAGNOSIS — N281 Cyst of kidney, acquired: Secondary | ICD-10-CM | POA: Diagnosis not present

## 2024-03-30 DIAGNOSIS — Z7982 Long term (current) use of aspirin: Secondary | ICD-10-CM | POA: Diagnosis not present

## 2024-04-07 NOTE — Progress Notes (Signed)
 [facility] Summary Report / Loop Recorder

## 2024-04-08 ENCOUNTER — Ambulatory Visit: Payer: Self-pay | Admitting: Cardiology

## 2024-04-08 ENCOUNTER — Ambulatory Visit (INDEPENDENT_AMBULATORY_CARE_PROVIDER_SITE_OTHER)

## 2024-04-08 DIAGNOSIS — I639 Cerebral infarction, unspecified: Secondary | ICD-10-CM

## 2024-04-08 DIAGNOSIS — Z09 Encounter for follow-up examination after completed treatment for conditions other than malignant neoplasm: Secondary | ICD-10-CM | POA: Diagnosis not present

## 2024-04-08 DIAGNOSIS — Z6822 Body mass index (BMI) 22.0-22.9, adult: Secondary | ICD-10-CM | POA: Diagnosis not present

## 2024-04-08 DIAGNOSIS — Z79899 Other long term (current) drug therapy: Secondary | ICD-10-CM | POA: Diagnosis not present

## 2024-04-08 DIAGNOSIS — N390 Urinary tract infection, site not specified: Secondary | ICD-10-CM | POA: Diagnosis not present

## 2024-04-08 DIAGNOSIS — E785 Hyperlipidemia, unspecified: Secondary | ICD-10-CM | POA: Diagnosis not present

## 2024-04-08 DIAGNOSIS — E559 Vitamin D deficiency, unspecified: Secondary | ICD-10-CM | POA: Diagnosis not present

## 2024-04-08 DIAGNOSIS — R2681 Unsteadiness on feet: Secondary | ICD-10-CM | POA: Diagnosis not present

## 2024-04-08 DIAGNOSIS — R197 Diarrhea, unspecified: Secondary | ICD-10-CM | POA: Diagnosis not present

## 2024-04-08 DIAGNOSIS — R739 Hyperglycemia, unspecified: Secondary | ICD-10-CM | POA: Diagnosis not present

## 2024-04-08 LAB — CUP PACEART REMOTE DEVICE CHECK
Date Time Interrogation Session: 20250820230704
Implantable Pulse Generator Implant Date: 20210413

## 2024-05-08 DIAGNOSIS — L570 Actinic keratosis: Secondary | ICD-10-CM | POA: Diagnosis not present

## 2024-05-10 ENCOUNTER — Ambulatory Visit (INDEPENDENT_AMBULATORY_CARE_PROVIDER_SITE_OTHER)

## 2024-05-10 DIAGNOSIS — I639 Cerebral infarction, unspecified: Secondary | ICD-10-CM | POA: Diagnosis not present

## 2024-05-10 LAB — CUP PACEART REMOTE DEVICE CHECK
Date Time Interrogation Session: 20250921230023
Implantable Pulse Generator Implant Date: 20210413

## 2024-05-11 ENCOUNTER — Ambulatory Visit: Payer: Self-pay | Admitting: Cardiology

## 2024-05-11 DIAGNOSIS — M159 Polyosteoarthritis, unspecified: Secondary | ICD-10-CM | POA: Diagnosis not present

## 2024-05-11 DIAGNOSIS — E559 Vitamin D deficiency, unspecified: Secondary | ICD-10-CM | POA: Diagnosis not present

## 2024-05-11 DIAGNOSIS — R2681 Unsteadiness on feet: Secondary | ICD-10-CM | POA: Diagnosis not present

## 2024-05-11 DIAGNOSIS — E785 Hyperlipidemia, unspecified: Secondary | ICD-10-CM | POA: Diagnosis not present

## 2024-05-11 DIAGNOSIS — Z89419 Acquired absence of unspecified great toe: Secondary | ICD-10-CM | POA: Diagnosis not present

## 2024-05-11 DIAGNOSIS — I5189 Other ill-defined heart diseases: Secondary | ICD-10-CM | POA: Diagnosis not present

## 2024-05-11 DIAGNOSIS — H539 Unspecified visual disturbance: Secondary | ICD-10-CM | POA: Diagnosis not present

## 2024-05-11 DIAGNOSIS — I10 Essential (primary) hypertension: Secondary | ICD-10-CM | POA: Diagnosis not present

## 2024-05-11 DIAGNOSIS — K219 Gastro-esophageal reflux disease without esophagitis: Secondary | ICD-10-CM | POA: Diagnosis not present

## 2024-05-11 DIAGNOSIS — Z8719 Personal history of other diseases of the digestive system: Secondary | ICD-10-CM | POA: Diagnosis not present

## 2024-05-11 DIAGNOSIS — Z8673 Personal history of transient ischemic attack (TIA), and cerebral infarction without residual deficits: Secondary | ICD-10-CM | POA: Diagnosis not present

## 2024-05-11 DIAGNOSIS — J309 Allergic rhinitis, unspecified: Secondary | ICD-10-CM | POA: Diagnosis not present

## 2024-05-11 NOTE — Progress Notes (Signed)
 Remote Loop Recorder Transmission

## 2024-05-12 NOTE — Progress Notes (Signed)
 Remote Loop Recorder Transmission

## 2024-05-13 DIAGNOSIS — J440 Chronic obstructive pulmonary disease with acute lower respiratory infection: Secondary | ICD-10-CM | POA: Diagnosis not present

## 2024-05-13 DIAGNOSIS — Z8673 Personal history of transient ischemic attack (TIA), and cerebral infarction without residual deficits: Secondary | ICD-10-CM | POA: Diagnosis not present

## 2024-05-13 DIAGNOSIS — N281 Cyst of kidney, acquired: Secondary | ICD-10-CM | POA: Diagnosis not present

## 2024-05-13 DIAGNOSIS — N1330 Unspecified hydronephrosis: Secondary | ICD-10-CM | POA: Diagnosis not present

## 2024-05-13 DIAGNOSIS — A419 Sepsis, unspecified organism: Secondary | ICD-10-CM | POA: Diagnosis not present

## 2024-05-13 DIAGNOSIS — Z87440 Personal history of urinary (tract) infections: Secondary | ICD-10-CM | POA: Diagnosis not present

## 2024-05-13 DIAGNOSIS — T368X5A Adverse effect of other systemic antibiotics, initial encounter: Secondary | ICD-10-CM | POA: Diagnosis not present

## 2024-05-13 DIAGNOSIS — G9389 Other specified disorders of brain: Secondary | ICD-10-CM | POA: Diagnosis not present

## 2024-05-13 DIAGNOSIS — D693 Immune thrombocytopenic purpura: Secondary | ICD-10-CM | POA: Diagnosis not present

## 2024-05-13 DIAGNOSIS — I361 Nonrheumatic tricuspid (valve) insufficiency: Secondary | ICD-10-CM | POA: Diagnosis not present

## 2024-05-13 DIAGNOSIS — R188 Other ascites: Secondary | ICD-10-CM | POA: Diagnosis not present

## 2024-05-13 DIAGNOSIS — M1990 Unspecified osteoarthritis, unspecified site: Secondary | ICD-10-CM | POA: Diagnosis not present

## 2024-05-13 DIAGNOSIS — K521 Toxic gastroenteritis and colitis: Secondary | ICD-10-CM | POA: Diagnosis not present

## 2024-05-13 DIAGNOSIS — J189 Pneumonia, unspecified organism: Secondary | ICD-10-CM | POA: Diagnosis not present

## 2024-05-13 DIAGNOSIS — G9341 Metabolic encephalopathy: Secondary | ICD-10-CM | POA: Diagnosis not present

## 2024-05-13 DIAGNOSIS — Z951 Presence of aortocoronary bypass graft: Secondary | ICD-10-CM | POA: Diagnosis not present

## 2024-05-13 DIAGNOSIS — I5032 Chronic diastolic (congestive) heart failure: Secondary | ICD-10-CM | POA: Diagnosis not present

## 2024-05-13 DIAGNOSIS — R4182 Altered mental status, unspecified: Secondary | ICD-10-CM | POA: Diagnosis not present

## 2024-05-13 DIAGNOSIS — I350 Nonrheumatic aortic (valve) stenosis: Secondary | ICD-10-CM | POA: Diagnosis not present

## 2024-05-13 DIAGNOSIS — I110 Hypertensive heart disease with heart failure: Secondary | ICD-10-CM | POA: Diagnosis not present

## 2024-05-13 DIAGNOSIS — R531 Weakness: Secondary | ICD-10-CM | POA: Diagnosis not present

## 2024-05-13 DIAGNOSIS — D649 Anemia, unspecified: Secondary | ICD-10-CM | POA: Diagnosis not present

## 2024-05-13 DIAGNOSIS — Z7982 Long term (current) use of aspirin: Secondary | ICD-10-CM | POA: Diagnosis not present

## 2024-05-13 DIAGNOSIS — Z9181 History of falling: Secondary | ICD-10-CM | POA: Diagnosis not present

## 2024-05-13 DIAGNOSIS — R410 Disorientation, unspecified: Secondary | ICD-10-CM | POA: Diagnosis not present

## 2024-05-13 DIAGNOSIS — Z85820 Personal history of malignant melanoma of skin: Secondary | ICD-10-CM | POA: Diagnosis not present

## 2024-05-13 DIAGNOSIS — N136 Pyonephrosis: Secondary | ICD-10-CM | POA: Diagnosis not present

## 2024-05-13 DIAGNOSIS — A4152 Sepsis due to Pseudomonas: Secondary | ICD-10-CM | POA: Diagnosis not present

## 2024-05-13 DIAGNOSIS — Z79899 Other long term (current) drug therapy: Secondary | ICD-10-CM | POA: Diagnosis not present

## 2024-05-13 DIAGNOSIS — N12 Tubulo-interstitial nephritis, not specified as acute or chronic: Secondary | ICD-10-CM | POA: Diagnosis not present

## 2024-05-13 DIAGNOSIS — R509 Fever, unspecified: Secondary | ICD-10-CM | POA: Diagnosis not present

## 2024-05-13 DIAGNOSIS — N390 Urinary tract infection, site not specified: Secondary | ICD-10-CM | POA: Diagnosis not present

## 2024-05-13 DIAGNOSIS — R9431 Abnormal electrocardiogram [ECG] [EKG]: Secondary | ICD-10-CM | POA: Diagnosis not present

## 2024-05-14 DIAGNOSIS — N281 Cyst of kidney, acquired: Secondary | ICD-10-CM | POA: Diagnosis not present

## 2024-05-14 DIAGNOSIS — N1330 Unspecified hydronephrosis: Secondary | ICD-10-CM | POA: Diagnosis not present

## 2024-05-14 DIAGNOSIS — Z9889 Other specified postprocedural states: Secondary | ICD-10-CM | POA: Diagnosis not present

## 2024-05-14 DIAGNOSIS — G9389 Other specified disorders of brain: Secondary | ICD-10-CM | POA: Diagnosis not present

## 2024-05-15 DIAGNOSIS — I350 Nonrheumatic aortic (valve) stenosis: Secondary | ICD-10-CM | POA: Diagnosis not present

## 2024-05-15 DIAGNOSIS — I361 Nonrheumatic tricuspid (valve) insufficiency: Secondary | ICD-10-CM | POA: Diagnosis not present

## 2024-05-24 NOTE — Progress Notes (Signed)
 Remote Loop Recorder Transmission

## 2024-05-26 ENCOUNTER — Other Ambulatory Visit: Payer: Self-pay | Admitting: Oncology

## 2024-05-26 ENCOUNTER — Inpatient Hospital Stay: Attending: Oncology | Admitting: Oncology

## 2024-05-26 ENCOUNTER — Encounter: Payer: Self-pay | Admitting: Oncology

## 2024-05-26 ENCOUNTER — Inpatient Hospital Stay

## 2024-05-26 ENCOUNTER — Other Ambulatory Visit: Payer: Self-pay

## 2024-05-26 VITALS — BP 109/76 | HR 64 | Temp 98.1°F | Resp 18 | Ht 65.6 in | Wt 126.0 lb

## 2024-05-26 DIAGNOSIS — D473 Essential (hemorrhagic) thrombocythemia: Secondary | ICD-10-CM

## 2024-05-26 DIAGNOSIS — Z7982 Long term (current) use of aspirin: Secondary | ICD-10-CM | POA: Insufficient documentation

## 2024-05-26 DIAGNOSIS — I2510 Atherosclerotic heart disease of native coronary artery without angina pectoris: Secondary | ICD-10-CM | POA: Insufficient documentation

## 2024-05-26 DIAGNOSIS — D474 Osteomyelofibrosis: Secondary | ICD-10-CM | POA: Diagnosis not present

## 2024-05-26 DIAGNOSIS — M4120 Other idiopathic scoliosis, site unspecified: Secondary | ICD-10-CM | POA: Diagnosis not present

## 2024-05-26 DIAGNOSIS — Z85828 Personal history of other malignant neoplasm of skin: Secondary | ICD-10-CM | POA: Diagnosis not present

## 2024-05-26 DIAGNOSIS — F1120 Opioid dependence, uncomplicated: Secondary | ICD-10-CM | POA: Diagnosis not present

## 2024-05-26 DIAGNOSIS — D649 Anemia, unspecified: Secondary | ICD-10-CM | POA: Insufficient documentation

## 2024-05-26 DIAGNOSIS — E559 Vitamin D deficiency, unspecified: Secondary | ICD-10-CM

## 2024-05-26 DIAGNOSIS — Z8544 Personal history of malignant neoplasm of other female genital organs: Secondary | ICD-10-CM | POA: Diagnosis not present

## 2024-05-26 DIAGNOSIS — N1330 Unspecified hydronephrosis: Secondary | ICD-10-CM | POA: Diagnosis not present

## 2024-05-26 DIAGNOSIS — Z85820 Personal history of malignant melanoma of skin: Secondary | ICD-10-CM | POA: Diagnosis not present

## 2024-05-26 DIAGNOSIS — Z8673 Personal history of transient ischemic attack (TIA), and cerebral infarction without residual deficits: Secondary | ICD-10-CM | POA: Insufficient documentation

## 2024-05-26 DIAGNOSIS — N136 Pyonephrosis: Secondary | ICD-10-CM | POA: Insufficient documentation

## 2024-05-26 DIAGNOSIS — R32 Unspecified urinary incontinence: Secondary | ICD-10-CM | POA: Insufficient documentation

## 2024-05-26 DIAGNOSIS — Z87440 Personal history of urinary (tract) infections: Secondary | ICD-10-CM | POA: Insufficient documentation

## 2024-05-26 DIAGNOSIS — I110 Hypertensive heart disease with heart failure: Secondary | ICD-10-CM | POA: Diagnosis not present

## 2024-05-26 DIAGNOSIS — E782 Mixed hyperlipidemia: Secondary | ICD-10-CM | POA: Insufficient documentation

## 2024-05-26 DIAGNOSIS — G4733 Obstructive sleep apnea (adult) (pediatric): Secondary | ICD-10-CM | POA: Insufficient documentation

## 2024-05-26 HISTORY — DX: Essential (hemorrhagic) thrombocythemia: D47.3

## 2024-05-26 LAB — CMP (CANCER CENTER ONLY)
ALT: 9 U/L (ref 0–44)
AST: 25 U/L (ref 15–41)
Albumin: 4 g/dL (ref 3.5–5.0)
Alkaline Phosphatase: 68 U/L (ref 38–126)
Anion gap: 11 (ref 5–15)
BUN: 26 mg/dL — ABNORMAL HIGH (ref 8–23)
CO2: 22 mmol/L (ref 22–32)
Calcium: 8.7 mg/dL — ABNORMAL LOW (ref 8.9–10.3)
Chloride: 107 mmol/L (ref 98–111)
Creatinine: 1.46 mg/dL — ABNORMAL HIGH (ref 0.44–1.00)
GFR, Estimated: 35 mL/min — ABNORMAL LOW (ref >60)
Glucose, Bld: 138 mg/dL — ABNORMAL HIGH (ref 70–99)
Potassium: 4.1 mmol/L (ref 3.5–5.1)
Sodium: 140 mmol/L (ref 135–145)
Total Bilirubin: 0.4 mg/dL (ref 0.0–1.2)
Total Protein: 7.8 g/dL (ref 6.5–8.1)

## 2024-05-26 LAB — CBC WITH DIFFERENTIAL (CANCER CENTER ONLY)
Abs Immature Granulocytes: 1.91 K/uL — ABNORMAL HIGH (ref 0.00–0.07)
Basophils Absolute: 0.1 K/uL (ref 0.0–0.1)
Basophils Relative: 1 %
Eosinophils Absolute: 0.1 K/uL (ref 0.0–0.5)
Eosinophils Relative: 0 %
HCT: 25.7 % — ABNORMAL LOW (ref 36.0–46.0)
Hemoglobin: 8.3 g/dL — ABNORMAL LOW (ref 12.0–15.0)
Immature Granulocytes: 9 %
Lymphocytes Relative: 8 %
Lymphs Abs: 1.7 K/uL (ref 0.7–4.0)
MCH: 33.2 pg (ref 26.0–34.0)
MCHC: 32.3 g/dL (ref 30.0–36.0)
MCV: 102.8 fL — ABNORMAL HIGH (ref 80.0–100.0)
Monocytes Absolute: 1.9 K/uL — ABNORMAL HIGH (ref 0.1–1.0)
Monocytes Relative: 9 %
Neutro Abs: 15.3 K/uL — ABNORMAL HIGH (ref 1.7–7.7)
Neutrophils Relative %: 73 %
Platelet Count: 112 K/uL — ABNORMAL LOW (ref 150–400)
RBC: 2.5 MIL/uL — ABNORMAL LOW (ref 3.87–5.11)
RDW: 18.1 % — ABNORMAL HIGH (ref 11.5–15.5)
WBC Count: 20.9 K/uL — ABNORMAL HIGH (ref 4.0–10.5)
nRBC: 0 % (ref 0.0–0.2)

## 2024-05-26 LAB — IRON AND TIBC
Iron: 63 ug/dL (ref 28–170)
Saturation Ratios: 22 % (ref 10.4–31.8)
TIBC: 283 ug/dL (ref 250–450)
UIBC: 220 ug/dL

## 2024-05-26 LAB — FERRITIN: Ferritin: 264 ng/mL (ref 11–307)

## 2024-05-26 LAB — VITAMIN B12: Vitamin B-12: 648 pg/mL (ref 180–914)

## 2024-05-26 LAB — FOLATE: Folate: >20 ng/mL (ref >5.9)

## 2024-05-26 NOTE — Progress Notes (Signed)
 [facility]  [address] [place],  [place]  [address] [phone]  Clinic Day: 05/26/24  Referring physician: Gable Cambric, MD  ASSESSMENT & PLAN:  Assessment: Essential thrombocythemia [facility]) Essential thrombocythemia previously treated with hydroxyurea .  Due to low normal platelets, hydroxyurea  was discontinued in October 2021.  She has persistent leukocytosis/neutrophilia, which she has had on and off since 2018.  This is felt to be due to her myeloproliferative disease.  Her counts remain stable, so we recommend continued observation.  We are seeing metamyelocytes and myelocytes but her blood counts are worsening now with 9% immature granulocytes and a total WBC of 20.9 with decreasing hemoglobin and platelet count. She is likely involving into myelofibrosis. We will not pursue bone marrow since she is too frail to consider any more aggressive treatment, which is limited anyway. We will recheck her blood counts in one month to see if they improve after treatment of her UTI and pneumonia.   Worsening Anemia Most likely this is related to her myeloproliferative disorder as described above. However, I will check iron, B-12, and folate levels today to rule out other causes. Her hemoglobin dropped from 10.7 to 8.3 this time but she also was just discharged from the hospital one week ago. On the day of discharge her WBC was 16.7, hemoglobin was 8.8, and platelet count was 75,000.   Recurrent urinary tract infections She has persistent symptoms of UTI despite recent treatment with nitrofurantoin .  I encouraged the patient to try the Azo-pyridium over the counter and at least some of the UTI prevention recommendations. She is now on Flomax for her urinary incontinence.   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 She continues to see the dermatologist regularly and he applied cryotherapy to multiple keratotic lesions this  week.  History of Vulvar Cancer This has been treated and she has no evidence of disease at last check.   TIA/Recurrent Strokes She knows to stay on Aspirin  and would probably benefit from more anticoagulation, but I feel this is risky in view of her frequent falls.   Plan: She continues to have recurrent UTI's and has been on multiple medications including D-mannose and prophylactic antibiotics. I encouraged her to use her topical estrogen cream for some relief. She was recently admitted to the hospital on 05/13/2024 with a fever of 103 and was found to have a UTI again. CT of the chest was clear but the abdomen revealed severe right hydronephrosis. CT of the head showed moderate global atrophy with severe chronic microvascular ischemic changes. She continues Levaquin  750 mg every other day without difficulty. She informed me that she has an appointment with Dr. Gable tomorrow and I will send a copy of her labs with her. She has a elevated WBC of 20.9 with 9% immature granulocytes, low hemoglobin of 8.3 down from 10.7, and low platelet count of 112,000. Looking at her CBC I believe that her essential thrombocythemia is evolving into myelofibrosis but some of these changes could be due to her recent serious infection as she had both pyelonephritis and pneumonia. Her CMP is fairly normal other than an elevated creatinine of 1.46 with a BUN of 26 and low calcium  of 8.7. I reminded her to avoid any NSAID medications. I will add B-12, folate, and iron studies to her labs today. I will see her back in 1 month with CBC, CMP, and vitamin D  level to see if her blood  counts improve after treatment of her infections. The patient understands the plans discussed today and is in agreement with them. She knows to contact our office if she develops concerns prior to her next appointment.  I provided 33 minutes of face-to-face time during this encounter and > 50% was spent counseling as documented under my assessment and  plan.   [name], MD  [facility] OF [facility] [address] [place] [address] Dept: [phone] Dept Fax: [phone]   No orders of the defined types were placed in this encounter.   CHIEF COMPLAINT:  CC: Essential thrombocythemia  Current Treatment: Surveillance  HISTORY OF PRESENT ILLNESS:  Yolanda Flowers is a 83 year old female with a history of essential thrombocythemia originally diagnosed in 46.  She was treated with hydroxyurea  500 mg 3 times daily.  We had to occasionally adjust doses because of leukopenia or anemia.  She also had a malignant melanoma of her left first toe resected in November 2015 with partial amputation of the toe and has done well.  She has had multiple other skin cancers removed over the years.  In March 2017, she was seen by Dr. Eloy for HSIL/VIN 3 of the right posterior labia minora and underwent vulvectomy.  Hydroxyurea  was decreased to 500 mg twice daily prior to surgery, as we did not feel we could safely hold hydroxyurea .  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  Bone density scan in March 2018 revealed osteopenia with a T-score of -1.6 in the femur, for which she is on calcium  and vitamin-D.  She has had chronic back pain, which is attributed to severe degenerative disease.  She had an MRI of the lumbar spine in March 2018, which revealed significant degenerative disc disease with mild spinal stenosis at L1 and L4, but moderate at L2-3.  Dr. Mindi has given her injections, as well at hydrocodone/APAP 5/325.  She has had intermittent leukocytosis since 2018, felt to be secondary to her essential thrombocythemia. She was evaluated in the emergency department in October 2019 and had a GI bleed. CT abdomen and pelvis revealed acute uncomplicated diverticulitis of the sigmoid colon  with no other acute abnormalities.  A moderate to large hiatal hernia was seen. She saw Dr. Edwyna and underwent nuclear med stress test in November, which did not reveal any evidence of inducible ischemia or other abnormality.  Left ventricular size and function was normal with an ejection fraction of 79%.  She underwent EGD and colonoscopy in December 2019, and no significant source of blood loss was identified. She was instructed to continue lansoprazole.  She had dilatation of an esophageal stricture and removal of a polyp. Pathology revealed a fragmented tubular adenoma.    In January 2020, she had worsening anemia, in addition to continued episodes of chest pain and worsening dyspnea with exertion at her routine follow-up. Due to the severe dyspnea and right lower extremity edema, we obtained a CT angiogram chest, which did not reveal any evidence of pulmonary embolism.  There was good opacification of the pulmonary arteries.  Diffuse coronary artery calcifications with cardiomegaly and moderate thoracic aortic atherosclerosis was seen.  There was a 3.6 mm noncalcified nodule in the anterior inferior right upper lobe. Right lower extremity venous Doppler ultrasound did not reveal any evidence of deep venous thrombosis. In February 2020, she had persistent anemia with  a hemoglobin of 9.7. As she was symptomatic, hydroxyurea  500 mg was decreased to once daily.  Soluble transferrin was elevated, which was consistent with iron deficiency.  She saw Dr. Larene and he placed her on iron supplementation daily in the form of ferrous sulfate 65 mg. She was seen again in March and had improvement in her anemia with a hemoglobin of 10.2.  Her thrombocythemia remained controlled on hydroxyurea  500 mg daily. We encouraged her to have follow-up for her vulvar cancer, so followed up with Dr. Derry Bunker and her exam was normal.     She had a bilateral screening mammogram in February 2021, which revealed an area  of calcifications in the right breast warranting further evaluation.  A diagnostic right mammogram was ordered, but this was delayed, because she underwent open heart surgery with CABG x4 in March.  She had a monitor placed in the left chest wall the time of surgery.  She did not feel she can have mammogram due to the monitor in the left chest wall.  She then had 2 strokes following her surgery, from which she has recovered well, except for balance issues and left eye blindness.  She ambulates with a cane or walker depending on her circumstances.  She is on clopidogrel  75 mg daily, atorvastatin  80 mg daily, metoprolol  25 mg twice daily and pantoprazole  40 mg daily. She had a CT chest in July to follow-up on the pulmonary nodule which were stable. She finally underwent diagnostic right mammogram from July which revealed likely benign 6 mm group of calcifications involving the upper inner quadrant at middle depth. Bilateral diagnostic mammogram in February 2022 was recommended  At her visit in October 2021, she reported elevated transaminases and purpura, which was attributed to atorvastatin .  Dr. Revankar reduced the dose from 80 mg to 40 mg with normalization of the transaminases and no further purpura.  Her platelets were in low normal range, so hydroxyurea  was held and since her platelets have remained normal, hydroxyurea  was discontinued. She has had intermittent mild leukocytosis/neutrophilia since 2018.  She has occasionally had immature cells on her blood smear, but not blasts.    When I saw her in May 2022, she had worsening leukocytosis with persistent immature cells and worsening anemia, so I recommended a bone marrow biopsy, but this was never done as the patient was hospitalized with a stroke and discharged to rehab. She was seen again in August, at which time her white count had decreased from 24.2 to 19.9 with an ANC of 1473, with 11% lymphocytes, 10% monocytes and 2.4 % basophils and her hemoglobin  had improved from 10.6 to 11.3. The platelet count remained normal, so continued observation was recommended.  Her blood counts were stable in November.   She was admitted in January 2023 with a 3 day history of sensation of an object stuck in her throat following attempted swallowing of calcium  supplementation. She was eventually able to dislodge item after several hours.  She then developed stool that she described as motor oil for 2 days. She was evaluated in the emergency room and was found to have hemoglobin of 8.3, decreased from 11.4 in November 2022.   She underwent urgent EGD and was found to have a 3 cm mucosal tear within the distal 5 cm of her esophagus, felt to be most likely secondary to mucosal tear following calcium  carbonate impaction. CT neck and chest was performed to evaluate for possibility of mediastinitis developing following possible full focus  mucosal tear. This imaging did not reveal active inflammatory changes. 3 clips were placed closing the tear successfully and complete hemostasis was achieved.  She was placed on twice daily pantoprazole  with slow advancement of diet from clear liquid to house select over 2 days.  She had an acute drop in hemoglobin to 6.9 following further equilibration, so received 1 unit PRBC with appropriate response to 8.2 g/dL.Her hemoglobin later improved.  Review of her blood smear in March revealed occasional myelocytes, occasional nucleated RBC's, poikilocytosis, and 1 promyelocyte. We have been concerned about transformation to leukemia, with the changes on peripheral smear.  Her white blood cell count has been running between 16.5 and 24.1, her hemoglobin has been running between 10.2 and 11.7 and platelets running between 108,000 and 129,000 for the past year.  As her counts have been fairly stable, we have continued observation.  Bilateral diagnostic mammogram in January did not reveal any evidence of malignancy, screening mammogram in 1 year was  recommended.  INTERVAL HISTORY:  Brileigh is here today for clinical assessment of her essential thrombocythemia. Patient states that she feels ok but complains of fatigue, weakness, and lower back pain rating 7/10. She continues to have recurrent UTI's and has been on multiple medications including D-mannose and prophylactic antibiotics. I encouraged her to use her topical estrogen cream for some relief. She was recently admitted into the hospital on 05/13/2024 with a fever of 103 and was found to have a UTI again. CT of the chest was clear but the abdomen revealed severe right hydronephrosis. CT of the head showed moderate global atrophy with severe chronic microvascular ischemic changes. She continues Levaquin  750 mg every other day without difficulty. She informed me that she has an appointment with Dr. Gable tomorrow and I will send a copy of her labs with her. She has a elevated WBC of 20.9 with 9% immature granulocytes, low hemoglobin of 8.3 down from 10.7, and low platelet count of 112,000. Looking at her CBC I believe that her essential thrombocythemia is evolving into myelofibrosis but some of these changes could be due to her recent serious infection as she had both pyelonephritis and pneumonia. Her CMP is fairly normal other than an elevated creatinine of 1.46 with a BUN of 26 and low calcium  of 8.7. I reminded her to avoid any NSAID's. I will add B-12, folate, and iron studies to her labs today. I will see her back in 1 month with CBC, CMP, and vitamin D  level to see if her blood counts improve after treatment of her infections.    She denies fever, chills, night sweats, or other signs of infection. She denies cardiorespiratory and gastrointestinal issues. Her appetite is good and Her weight has decreased 10 pounds over last 5.5 months.   REVIEW OF SYSTEMS:  Review of Systems  Constitutional:  Positive for fatigue. Negative for appetite change, chills, fever and unexpected weight change.  HENT:    Positive for hearing loss. Negative for lump/mass, mouth sores, nosebleeds, sore throat, tinnitus, trouble swallowing and voice change.   Eyes: Negative.  Negative for eye problems and icterus.  Respiratory: Negative.  Negative for chest tightness, cough, hemoptysis, shortness of breath and wheezing.   Cardiovascular: Negative.  Negative for chest pain, leg swelling and palpitations.  Gastrointestinal: Negative.  Negative for abdominal distention, abdominal pain, blood in stool, constipation, diarrhea, nausea, rectal pain and vomiting.  Endocrine: Negative.  Negative for hot flashes.  Genitourinary:  Negative for bladder incontinence, difficulty urinating, dyspareunia, dysuria,  frequency, hematuria, menstrual problem, nocturia, pelvic pain, vaginal bleeding and vaginal discharge.        Recurrent UTI's. (She has tried prophylactic antibiotics, D-mannose and another medication)  Musculoskeletal:  Positive for back pain (7/10) and gait problem (Due to previous CVA, using cane). Negative for arthralgias, flank pain, myalgias, neck pain and neck stiffness.  Skin: Negative.  Negative for itching, rash and wound.  Neurological:  Positive for extremity weakness and gait problem (Due to previous CVA, using cane). Negative for dizziness, headaches, light-headedness, numbness, seizures and speech difficulty.  Hematological:  Negative for adenopathy. Bruises/bleeds easily.  Psychiatric/Behavioral: Negative.  Negative for confusion, decreased concentration, depression, sleep disturbance and suicidal ideas. The patient is not nervous/anxious.     VITALS:  Blood pressure 109/76, pulse 64, temperature 98.1 F (36.7 C), temperature source Oral, resp. rate 18, height 5' 5.6 (1.666 m), weight 126 lb (57.2 kg), SpO2 99%.  Wt Readings from Last 3 Encounters:  05/26/24 126 lb (57.2 kg)  12/03/23 136 lb (61.7 kg)  09/17/23 143 lb 4.8 oz (65 kg)    Body mass index is 20.59 kg/m.  Performance status (ECOG): 1 -  Symptomatic but completely ambulatory  PHYSICAL EXAM:  Physical Exam Vitals and nursing note reviewed.  Constitutional:      General: She is not in acute distress.    Appearance: Normal appearance. She is normal weight. She is not ill-appearing, toxic-appearing or diaphoretic.  HENT:     Head: Normocephalic and atraumatic.     Right Ear: Tympanic membrane, ear canal and external ear normal. Decreased hearing noted. There is no impacted cerumen.     Left Ear: Tympanic membrane, ear canal and external ear normal. Decreased hearing noted. There is no impacted cerumen.     Nose: Nose normal. No congestion or rhinorrhea.     Mouth/Throat:     Mouth: Mucous membranes are moist.     Pharynx: Oropharynx is clear. No oropharyngeal exudate or posterior oropharyngeal erythema.  Eyes:     General: No scleral icterus.       Right eye: No discharge.        Left eye: No discharge.     Extraocular Movements: Extraocular movements intact.     Conjunctiva/sclera: Conjunctivae normal.     Pupils: Pupils are equal, round, and reactive to light.  Cardiovascular:     Rate and Rhythm: Normal rate and regular rhythm.     Pulses: Normal pulses.     Heart sounds: Murmur heard.     Systolic murmur is present with a grade of 1/6.     No friction rub. No gallop.  Pulmonary:     Effort: Pulmonary effort is normal. No respiratory distress.     Breath sounds: Normal breath sounds. No wheezing, rhonchi or rales.  Chest:     Comments: Bilateral breasts are without masses.  Abdominal:     General: Bowel sounds are normal. There is no distension.     Palpations: Abdomen is soft. There is no hepatomegaly, splenomegaly or mass.     Comments: Well healed sternal scar  Musculoskeletal:        General: Normal range of motion.     Cervical back: Normal range of motion and neck supple. No tenderness.     Right lower leg: No edema.     Left lower leg: No edema.  Lymphadenopathy:     Cervical: No cervical adenopathy.      Right cervical: No superficial, deep or posterior cervical adenopathy.  Left cervical: No superficial, deep or posterior cervical adenopathy.     Upper Body:     Right upper body: No supraclavicular, axillary or pectoral adenopathy.     Left upper body: No supraclavicular, axillary or pectoral adenopathy.     Lower Body: No right inguinal adenopathy. No left inguinal adenopathy.  Skin:    General: Skin is warm and dry.     Coloration: Skin is not jaundiced.     Findings: Bruising and ecchymosis present. No lesion or rash.     Comments: Multiple keratosis of her upper extremities and face that have been treated Large ecchymosis of the left upper arm She has scattered bruises over both upper extremities   Neurological:     General: No focal deficit present.     Mental Status: She is alert and oriented to person, place, and time. Mental status is at baseline.     Cranial Nerves: No cranial nerve deficit.     Sensory: No sensory deficit.     Motor: No weakness.     Coordination: Coordination normal.     Gait: Gait normal.  Psychiatric:        Mood and Affect: Mood normal.        Behavior: Behavior normal.        Thought Content: Thought content normal.        Judgment: Judgment normal.    LABS:      Latest Ref Rng & Units 05/26/2024    3:22 PM 12/03/2023    3:47 PM 11/13/2023   12:00 AM  CBC  WBC 4.0 - 10.5 K/uL 20.9  10.3  16.4      Hemoglobin 12.0 - 15.0 g/dL 8.3  89.2  89.7      Hematocrit 36.0 - 46.0 % 25.7  34.5  32      Platelets 150 - 400 K/uL 112  117  138         This result is from an external source.      Latest Ref Rng & Units 05/26/2024    3:22 PM 12/03/2023    3:47 PM 09/17/2023   12:49 PM  CMP  Glucose 70 - 99 mg/dL 861  890  91   BUN 8 - 23 mg/dL 26  14  15    Creatinine 0.44 - 1.00 mg/dL 8.53  9.02  9.03   Sodium 135 - 145 mmol/L 140  142  143   Potassium 3.5 - 5.1 mmol/L 4.1  4.3  3.9   Chloride 98 - 111 mmol/L 107  106  105   CO2 22 - 32 mmol/L 22   25  25    Calcium  8.9 - 10.3 mg/dL 8.7  9.0  9.1   Total Protein 6.5 - 8.1 g/dL 7.8  7.8  7.6   Total Bilirubin 0.0 - 1.2 mg/dL 0.4  0.3  0.4   Alkaline Phos 38 - 126 U/L 68  73  75   AST 15 - 41 U/L 25  24  26    ALT 0 - 44 U/L 9  13  16     No results found for: CEA1, CEA / No results found for: CEA1, CEA No results found for: PSA1 No results found for: CAN199 No results found for: CAN125  No results found for: TOTALPROTELP, ALBUMINELP, A1GS, A2GS, BETS, BETA2SER, GAMS, MSPIKE, SPEI Lab Results  Component Value Date   TIBC 283 05/26/2024   TIBC 351 11/13/2023   TIBC 484 (H) 10/17/2021   FERRITIN  264 05/26/2024   FERRITIN 43 11/13/2023   FERRITIN 16 10/17/2021   IRONPCTSAT 22 05/26/2024   IRONPCTSAT 26 11/13/2023   IRONPCTSAT 18 10/17/2021   Lab Results  Component Value Date   VITAMINB12 648 05/26/2024   Lab Results  Component Value Date   FOLATE >20.0 05/26/2024   No results found for: LDH  STUDIES:  CUP PACEART REMOTE DEVICE CHECK Result Date: 05/10/2024 ILR summary report received. Battery status OK. Normal device function. No new symptom, tachy, brady, or pause episodes. No new AF episodes. Monthly summary reports and ROV/PRN. [facility], [facility]     HISTORY:   Past Medical History:  Diagnosis Date   Abdominal pain    Accelerating angina (HCC) 08/26/2019   Acute blood loss anemia 12/14/2019   Acute cardioembolic stroke (HCC) 11/30/2019   Allergic rhinitis 06/21/2014   Anxiety    Arthritis    Asthma    Atherosclerosis of arteries 08/24/2019   Bilateral edema of lower extremity    Body mass index (BMI) 25.0-25.9, adult 08/24/2019   CAD (coronary artery disease) 10/25/2019   Cellulitis of right leg    Chronic low back pain 01/27/2018   Combined hyperlipidemia 08/24/2019   Complication of anesthesia    difficulty waking   Congestive heart failure (CHF) (HCC)    Coronary artery disease    Coronary artery disease involving native  heart without angina pectoris    Dyspnea 02/09/2019   Dyspnea on exertion    Dysuria 09/19/2021   Embolic stroke (HCC) 11/30/2019   Essential (primary) hypertension 02/20/2021   Essential thrombocythemia (HCC) 06/22/2014   Essential thrombocytosis (HCC) 06/22/2014   Gastrointestinal bleeding 06/23/2018   GERD without esophagitis 06/21/2014   History of melanoma excision    left greast toe 2015   History of squamous cell carcinoma excision    face--  multiple excisions   History of subdural hematoma    2008   HTN (hypertension) 06/21/2014   Hypertension    Idiopathic scoliosis and kyphoscoliosis 01/27/2018   IMO SNOMED Dx Update Oct 2024     Ischial bursitis, right 08/24/2019   Leucocytosis 12/14/2019   Leukocytosis 12/14/2019   Malignant melanoma of great toe (HCC) 08/24/2019   Melanoma in situ (HCC) 06/29/2014   Mild mitral stenosis 09/20/2022   Mixed hyperlipidemia 08/24/2019   Opioid dependence (HCC) 08/24/2019   OSA (obstructive sleep apnea)    intolerant   Pedal edema 08/24/2019   Pneumonia of both lungs due to infectious organism 12/20/2020   Recurrent urinary tract infection 02/27/2023   Renal insufficiency 08/24/2019   S/P CABG x 4 11/04/2019   Skin cancer 08/24/2019   Solitary pulmonary nodule on lung CT 02/09/2019   Squamous cell carcinoma, scalp/neck 08/24/2019   Stroke (HCC) 12/21/2020   Stroke due to embolism (HCC) 11/25/2019   Subdural hematoma (HCC) 12/20/2020   Swelling of limb 08/24/2019   Traumatic subdural hemorrhage with loss of consciousness of unspecified duration, initial encounter (HCC) 02/20/2021   VIN III (vulvar intraepithelial neoplasia III)    Vitamin B 12 deficiency 08/24/2019   Vitamin D  deficiency 08/24/2019   Wears dentures    UPPER AND LOWER PARTIAL   Wears glasses     Past Surgical History:  Procedure Laterality Date   ABDOMINAL HYSTERECTOMY  age 35   CARDIAC CATHETERIZATION  03/172021   CORONARY ARTERY BYPASS GRAFT N/A  11/04/2019   Procedure: CORONARY ARTERY BYPASS GRAFTING (CABG) x 4, with ENDOSCOPIC HARVESTING OF RIGHT GREATER SAPHENOUS VEIN.;  Surgeon: Lucas Pride  K, MD;  Location: [facility] OR;  Service: Open Heart Surgery;  Laterality: N/A;   CORONARY ULTRASOUND/IVUS N/A 11/03/2019   Procedure: Intravascular Ultrasound/IVUS;  Surgeon: Mady Bruckner, MD;  Location: [facility] INVASIVE CV LAB;  Service: Cardiovascular;  Laterality: N/A;   KNEE ARTHROSCOPY Left 2004   LEFT HEART CATH AND CORONARY ANGIOGRAPHY N/A 11/03/2019   Procedure: LEFT HEART CATH AND CORONARY ANGIOGRAPHY;  Surgeon: Mady Bruckner, MD;  Location: [facility] INVASIVE CV LAB;  Service: Cardiovascular;  Laterality: N/A;   LOOP RECORDER INSERTION N/A 11/30/2019   Procedure: LOOP RECORDER INSERTION;  Surgeon: Kelsie Agent, MD;  Location: [facility] INVASIVE CV LAB;  Service: Cardiovascular;  Laterality: N/A;   MELANOMA EXCISION  2015   left great toe   REPAIR PERONEAL TENDONS ANKLE  2004   SUBDURAL HEMATOMA EVACUATION VIA CRANIOTOMY  2008      week later  post-op  [facility]   TEE WITHOUT CARDIOVERSION N/A 11/04/2019   Procedure: TRANSESOPHAGEAL ECHOCARDIOGRAM (TEE);  Surgeon: Lucas Dorise POUR, MD;  Location: [facility] OR;  Service: Open Heart Surgery;  Laterality: N/A;   VULVECTOMY N/A 10/24/2015   Procedure: WIDE LOCAL EXCISION OF THE VULVA ;  Surgeon: Maurilio Ship, MD;  Location: [facility];  Service: Gynecology;  Laterality: N/A;    Family History  Problem Relation Age of Onset   Clotting disorder Mother    Hypertension Mother    Heart disease Mother    Arthritis Mother    Stroke Father    Hypertension Father    Heart disease Father    Arthritis Father    Emphysema Father    Asthma Father    Kidney failure Father    Hypertension Sister    Heart disease Sister    Arthritis Sister    Hypertension Brother    Heart disease Brother    Arthritis Brother     Social History:  reports that she has never smoked. She has never used smokeless  tobacco. She reports that she does not drink alcohol and does not use drugs.The patient is alone today.  Allergies:  Allergies  Allergen Reactions   Cephalexin Hives    Tolerated Ceftriaxone  01/10/24 admission   Other    Penicillin G    Ciprofloxacin  Other (See Comments)    Gi intolerance   Penicillins Rash    Childhood reaction Did it involve swelling of the face/tongue/throat, SOB, or low BP? No Did it involve sudden or severe rash/hives, skin peeling, or any reaction on the inside of your mouth or nose? No Did you need to seek medical attention at a hospital or doctor's office? No When did it last happen?      50 years ago If all above answers are "NO", may proceed with cephalosporin use.     Current Medications: Current Outpatient Medications  Medication Sig Dispense Refill   aspirin  EC 81 MG tablet Take 1 tablet (81 mg total) by mouth daily. Swallow whole. 90 tablet 3   atorvastatin  (LIPITOR ) 20 MG tablet TAKE 1 TABLET BY MOUTH EVERY DAY 90 tablet 3   fluticasone  (FLONASE ) 50 MCG/ACT nasal spray Place 2 sprays into both nostrils at bedtime.      levofloxacin  (LEVAQUIN ) 750 MG tablet Take 750 mg by mouth every other day.     meclizine (ANTIVERT) 12.5 MG tablet Take 12.5 mg by mouth 2 (two) times daily as needed for dizziness.     metoprolol  tartrate (LOPRESSOR ) 25 MG tablet TAKE 1 TABLET BY MOUTH TWICE A DAY  180 tablet 3   Multiple Vitamins-Minerals (PRESERVISION AREDS 2) CAPS Take 1 capsule by mouth in the morning and at bedtime.      omeprazole (PRILOSEC) 20 MG capsule Take 20 mg by mouth daily.     tamsulosin (FLOMAX) 0.4 MG CAPS capsule Take 0.4 mg by mouth daily.     Vitamin D , Ergocalciferol , (DRISDOL ) 1.25 MG (50000 UT) CAPS capsule Take 50,000 Units by mouth every 14 (fourteen) days.     ammonium lactate (LAC-HYDRIN) 12 % lotion Apply 1 Application topically as needed for dry skin.     b complex vitamins capsule Take 1 capsule by mouth daily.     fosfomycin (MONUROL) 3  g PACK SMARTSIG:3 Gram(s) By Mouth Once a Week     magnesium  oxide (MAG-OX) 400 MG tablet Take 400 mg by mouth daily.     Mirabegron (MYRBETRIQ PO) Take by mouth.     Trospium Chloride 60 MG CP24 Take 1 capsule by mouth daily.     No current facility-administered medications for this visit.    I,Jasmine M Lassiter,acting as a scribe for [name], MD.,have documented all relevant documentation on the behalf of [name], MD,as directed by  [name], MD while in the presence of [name], MD.

## 2024-05-27 DIAGNOSIS — R531 Weakness: Secondary | ICD-10-CM | POA: Diagnosis not present

## 2024-05-27 DIAGNOSIS — Z09 Encounter for follow-up examination after completed treatment for conditions other than malignant neoplasm: Secondary | ICD-10-CM | POA: Diagnosis not present

## 2024-05-27 DIAGNOSIS — N12 Tubulo-interstitial nephritis, not specified as acute or chronic: Secondary | ICD-10-CM | POA: Diagnosis not present

## 2024-05-27 DIAGNOSIS — Z8673 Personal history of transient ischemic attack (TIA), and cerebral infarction without residual deficits: Secondary | ICD-10-CM | POA: Diagnosis not present

## 2024-05-27 DIAGNOSIS — N1330 Unspecified hydronephrosis: Secondary | ICD-10-CM | POA: Diagnosis not present

## 2024-05-27 DIAGNOSIS — Z6821 Body mass index (BMI) 21.0-21.9, adult: Secondary | ICD-10-CM | POA: Diagnosis not present

## 2024-05-27 DIAGNOSIS — J189 Pneumonia, unspecified organism: Secondary | ICD-10-CM | POA: Diagnosis not present

## 2024-05-27 DIAGNOSIS — W19XXXA Unspecified fall, initial encounter: Secondary | ICD-10-CM | POA: Diagnosis not present

## 2024-05-27 DIAGNOSIS — D649 Anemia, unspecified: Secondary | ICD-10-CM | POA: Diagnosis not present

## 2024-05-27 DIAGNOSIS — Z79899 Other long term (current) drug therapy: Secondary | ICD-10-CM | POA: Diagnosis not present

## 2024-05-28 ENCOUNTER — Telehealth: Payer: Self-pay

## 2024-05-28 DIAGNOSIS — I10 Essential (primary) hypertension: Secondary | ICD-10-CM | POA: Diagnosis not present

## 2024-05-28 DIAGNOSIS — J449 Chronic obstructive pulmonary disease, unspecified: Secondary | ICD-10-CM | POA: Diagnosis not present

## 2024-05-28 DIAGNOSIS — Z7982 Long term (current) use of aspirin: Secondary | ICD-10-CM | POA: Diagnosis not present

## 2024-05-28 DIAGNOSIS — Z951 Presence of aortocoronary bypass graft: Secondary | ICD-10-CM | POA: Diagnosis not present

## 2024-05-28 DIAGNOSIS — D649 Anemia, unspecified: Secondary | ICD-10-CM | POA: Diagnosis not present

## 2024-05-28 DIAGNOSIS — Z79899 Other long term (current) drug therapy: Secondary | ICD-10-CM | POA: Diagnosis not present

## 2024-05-28 LAB — LAB REPORT - SCANNED: EGFR: 41

## 2024-05-28 NOTE — Telephone Encounter (Signed)
 Attempted to contact patient. No answer.

## 2024-05-28 NOTE — Telephone Encounter (Signed)
-----   Message from Yolanda Flowers sent at 05/28/2024  1:17 PM EDT ----- Regarding: call Tell her vitamin levels all look good

## 2024-06-01 ENCOUNTER — Telehealth: Payer: Self-pay

## 2024-06-01 NOTE — Telephone Encounter (Signed)
 Attempted to contact patient. No answer and no VM.

## 2024-06-01 NOTE — Telephone Encounter (Signed)
-----   Message from Yolanda Flowers sent at 05/28/2024  1:17 PM EDT ----- Regarding: call Tell her vitamin levels all look good

## 2024-06-04 DIAGNOSIS — N1330 Unspecified hydronephrosis: Secondary | ICD-10-CM

## 2024-06-04 HISTORY — DX: Unspecified hydronephrosis: N13.30

## 2024-06-07 ENCOUNTER — Telehealth: Payer: Self-pay

## 2024-06-07 NOTE — Telephone Encounter (Signed)
 Attempted to contact patient. No answer.

## 2024-06-07 NOTE — Telephone Encounter (Signed)
-----   Message from [name] sent at 06/04/2024  7:08 PM EDT ----- Regarding: call Tell her the vitamin levels are all good

## 2024-06-10 ENCOUNTER — Encounter

## 2024-06-10 ENCOUNTER — Ambulatory Visit

## 2024-06-10 DIAGNOSIS — I639 Cerebral infarction, unspecified: Secondary | ICD-10-CM

## 2024-06-10 LAB — CUP PACEART REMOTE DEVICE CHECK
Date Time Interrogation Session: 20251022230429
Implantable Pulse Generator Implant Date: 20210413

## 2024-06-11 NOTE — Progress Notes (Signed)
 Remote Loop Recorder Transmission

## 2024-06-15 ENCOUNTER — Ambulatory Visit: Payer: Self-pay | Admitting: Cardiology

## 2024-06-22 DIAGNOSIS — I5189 Other ill-defined heart diseases: Secondary | ICD-10-CM | POA: Diagnosis not present

## 2024-06-22 DIAGNOSIS — R42 Dizziness and giddiness: Secondary | ICD-10-CM | POA: Diagnosis not present

## 2024-06-22 DIAGNOSIS — Z23 Encounter for immunization: Secondary | ICD-10-CM | POA: Diagnosis not present

## 2024-06-22 DIAGNOSIS — H539 Unspecified visual disturbance: Secondary | ICD-10-CM | POA: Diagnosis not present

## 2024-06-22 DIAGNOSIS — R2681 Unsteadiness on feet: Secondary | ICD-10-CM | POA: Diagnosis not present

## 2024-06-22 DIAGNOSIS — E785 Hyperlipidemia, unspecified: Secondary | ICD-10-CM | POA: Diagnosis not present

## 2024-06-22 DIAGNOSIS — E559 Vitamin D deficiency, unspecified: Secondary | ICD-10-CM | POA: Diagnosis not present

## 2024-06-22 DIAGNOSIS — M159 Polyosteoarthritis, unspecified: Secondary | ICD-10-CM | POA: Diagnosis not present

## 2024-06-22 DIAGNOSIS — I10 Essential (primary) hypertension: Secondary | ICD-10-CM | POA: Diagnosis not present

## 2024-06-22 DIAGNOSIS — Z8673 Personal history of transient ischemic attack (TIA), and cerebral infarction without residual deficits: Secondary | ICD-10-CM | POA: Diagnosis not present

## 2024-06-22 DIAGNOSIS — Z6821 Body mass index (BMI) 21.0-21.9, adult: Secondary | ICD-10-CM | POA: Diagnosis not present

## 2024-06-25 ENCOUNTER — Encounter: Payer: Self-pay | Admitting: Oncology

## 2024-06-25 ENCOUNTER — Inpatient Hospital Stay

## 2024-06-25 ENCOUNTER — Inpatient Hospital Stay: Attending: Oncology | Admitting: Oncology

## 2024-06-25 ENCOUNTER — Other Ambulatory Visit: Payer: Self-pay | Admitting: Oncology

## 2024-06-25 VITALS — BP 136/68 | HR 66 | Temp 98.2°F | Resp 18 | Ht 65.6 in | Wt 130.5 lb

## 2024-06-25 DIAGNOSIS — G4733 Obstructive sleep apnea (adult) (pediatric): Secondary | ICD-10-CM | POA: Insufficient documentation

## 2024-06-25 DIAGNOSIS — Z85820 Personal history of malignant melanoma of skin: Secondary | ICD-10-CM | POA: Insufficient documentation

## 2024-06-25 DIAGNOSIS — E782 Mixed hyperlipidemia: Secondary | ICD-10-CM | POA: Insufficient documentation

## 2024-06-25 DIAGNOSIS — N390 Urinary tract infection, site not specified: Secondary | ICD-10-CM | POA: Insufficient documentation

## 2024-06-25 DIAGNOSIS — I110 Hypertensive heart disease with heart failure: Secondary | ICD-10-CM | POA: Insufficient documentation

## 2024-06-25 DIAGNOSIS — D649 Anemia, unspecified: Secondary | ICD-10-CM | POA: Insufficient documentation

## 2024-06-25 DIAGNOSIS — D471 Chronic myeloproliferative disease: Secondary | ICD-10-CM | POA: Diagnosis not present

## 2024-06-25 DIAGNOSIS — D473 Essential (hemorrhagic) thrombocythemia: Secondary | ICD-10-CM

## 2024-06-25 DIAGNOSIS — G8929 Other chronic pain: Secondary | ICD-10-CM | POA: Insufficient documentation

## 2024-06-25 DIAGNOSIS — Z85828 Personal history of other malignant neoplasm of skin: Secondary | ICD-10-CM | POA: Diagnosis not present

## 2024-06-25 DIAGNOSIS — I700 Atherosclerosis of aorta: Secondary | ICD-10-CM | POA: Diagnosis not present

## 2024-06-25 DIAGNOSIS — Z8673 Personal history of transient ischemic attack (TIA), and cerebral infarction without residual deficits: Secondary | ICD-10-CM | POA: Insufficient documentation

## 2024-06-25 DIAGNOSIS — E538 Deficiency of other specified B group vitamins: Secondary | ICD-10-CM | POA: Insufficient documentation

## 2024-06-25 DIAGNOSIS — R300 Dysuria: Secondary | ICD-10-CM

## 2024-06-25 DIAGNOSIS — K449 Diaphragmatic hernia without obstruction or gangrene: Secondary | ICD-10-CM | POA: Diagnosis not present

## 2024-06-25 DIAGNOSIS — Z8544 Personal history of malignant neoplasm of other female genital organs: Secondary | ICD-10-CM | POA: Insufficient documentation

## 2024-06-25 DIAGNOSIS — D474 Osteomyelofibrosis: Secondary | ICD-10-CM

## 2024-06-25 DIAGNOSIS — Z87440 Personal history of urinary (tract) infections: Secondary | ICD-10-CM | POA: Insufficient documentation

## 2024-06-25 DIAGNOSIS — I2510 Atherosclerotic heart disease of native coronary artery without angina pectoris: Secondary | ICD-10-CM | POA: Diagnosis not present

## 2024-06-25 DIAGNOSIS — J189 Pneumonia, unspecified organism: Secondary | ICD-10-CM | POA: Insufficient documentation

## 2024-06-25 DIAGNOSIS — D75839 Thrombocytosis, unspecified: Secondary | ICD-10-CM | POA: Diagnosis not present

## 2024-06-25 DIAGNOSIS — E559 Vitamin D deficiency, unspecified: Secondary | ICD-10-CM | POA: Diagnosis not present

## 2024-06-25 LAB — URINALYSIS, COMPLETE (UACMP) WITH MICROSCOPIC
Bilirubin Urine: NEGATIVE
Glucose, UA: NEGATIVE mg/dL
Ketones, ur: NEGATIVE mg/dL
Nitrite: POSITIVE — AB
Protein, ur: >=300 mg/dL — AB
Specific Gravity, Urine: 1.025 (ref 1.005–1.030)
WBC, UA: >50 WBC/hpf (ref 0–5)
pH: 6 (ref 5.0–8.0)

## 2024-06-25 LAB — CBC WITH DIFFERENTIAL (CANCER CENTER ONLY)
Abs Immature Granulocytes: 0.84 K/uL — ABNORMAL HIGH (ref 0.00–0.07)
Basophils Absolute: 0.1 K/uL (ref 0.0–0.1)
Basophils Relative: 1 %
Eosinophils Absolute: 0.1 K/uL (ref 0.0–0.5)
Eosinophils Relative: 1 %
HCT: 28.4 % — ABNORMAL LOW (ref 36.0–46.0)
Hemoglobin: 8.9 g/dL — ABNORMAL LOW (ref 12.0–15.0)
Immature Granulocytes: 7 %
Lymphocytes Relative: 11 %
Lymphs Abs: 1.4 K/uL (ref 0.7–4.0)
MCH: 32.2 pg (ref 26.0–34.0)
MCHC: 31.3 g/dL (ref 30.0–36.0)
MCV: 102.9 fL — ABNORMAL HIGH (ref 80.0–100.0)
Monocytes Absolute: 1.3 K/uL — ABNORMAL HIGH (ref 0.1–1.0)
Monocytes Relative: 10 %
Neutro Abs: 8.9 K/uL — ABNORMAL HIGH (ref 1.7–7.7)
Neutrophils Relative %: 70 %
Platelet Count: 101 K/uL — ABNORMAL LOW (ref 150–400)
RBC: 2.76 MIL/uL — ABNORMAL LOW (ref 3.87–5.11)
RDW: 18.1 % — ABNORMAL HIGH (ref 11.5–15.5)
WBC Count: 12.6 K/uL — ABNORMAL HIGH (ref 4.0–10.5)
nRBC: 0.2 % (ref 0.0–0.2)

## 2024-06-25 LAB — CMP (CANCER CENTER ONLY)
ALT: 9 U/L (ref 0–44)
AST: 19 U/L (ref 15–41)
Albumin: 4.1 g/dL (ref 3.5–5.0)
Alkaline Phosphatase: 77 U/L (ref 38–126)
Anion gap: 12 (ref 5–15)
BUN: 16 mg/dL (ref 8–23)
CO2: 25 mmol/L (ref 22–32)
Calcium: 8.8 mg/dL — ABNORMAL LOW (ref 8.9–10.3)
Chloride: 103 mmol/L (ref 98–111)
Creatinine: 1.09 mg/dL — ABNORMAL HIGH (ref 0.44–1.00)
GFR, Estimated: 50 mL/min — ABNORMAL LOW (ref >60)
Glucose, Bld: 113 mg/dL — ABNORMAL HIGH (ref 70–99)
Potassium: 3.5 mmol/L (ref 3.5–5.1)
Sodium: 140 mmol/L (ref 135–145)
Total Bilirubin: 0.5 mg/dL (ref 0.0–1.2)
Total Protein: 7.8 g/dL (ref 6.5–8.1)

## 2024-06-25 MED ORDER — SULFAMETHOXAZOLE-TRIMETHOPRIM 800-160 MG PO TABS: 1.0000 | ORAL_TABLET | Freq: Two times a day (BID) | ORAL | 0 refills | Status: DC

## 2024-06-25 NOTE — Progress Notes (Signed)
 [facility]  [address] [place],  [place]  [address] [phone]  Clinic Day: 06/25/2024  Referring physician: Gable Cambric, MD  ASSESSMENT & PLAN:  Assessment: Essential thrombocythemia [facility]) Essential thrombocythemia previously treated with hydroxyurea .  Due to low normal platelets, hydroxyurea  was discontinued in October 2021.  She has persistent leukocytosis/neutrophilia, which she has had on and off since 2018.  This is felt to be due to her myeloproliferative disease.  Her counts remain stable, so we recommend continued observation.  We are seeing metamyelocytes and myelocytes but her blood counts are worsening now with 9% immature granulocytes and a total WBC of 20.9 with decreasing hemoglobin and platelet count. She is likely involving into myelofibrosis. We will not pursue bone marrow since she is too frail to consider any more aggressive treatment, which is limited anyway. We brought her back earlier since her blood counts were very high last time and she was getting over a UTI and pneumonia. Her blood is much better today.  Worsening Anemia Most likely this is related to her myeloproliferative disorder as described above. However, I will check iron, B-12, and folate levels today to rule out other causes. Her hemoglobin dropped from 10.7 to 8.3 this time but she also was just discharged from the hospital one week ago. On the day of discharge her WBC was 16.7, hemoglobin was 8.8, and platelet count was 75,000.   Recurrent urinary tract infections She has persistent symptoms of UTI despite recent treatment with nitrofurantoin .  I encouraged the patient to try the Azo-pyridium over the counter and at least some of the UTI prevention recommendations. She is now on Flomax for her urinary incontinence.   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 She continues to see the dermatologist regularly and he applied  cryotherapy to multiple keratotic lesions this week.  History of Vulvar Cancer This has been treated and she has no evidence of disease at last check.   TIA/Recurrent Strokes She knows to stay on Aspirin  and would probably benefit from more anticoagulation, but I feel this is risky in view of her frequent falls.   Plan: She feels well, but complains of severe dysuria and chronic back pain. I will prescribe Bactrim  DS BID for 10 days to treat for a possible UTI as we await the results of her urinalysis and urine culture. She is now taking oral vitamin D  once monthly rather than every 2 weeks. We will check the level today. She stated that she recently visited her PCP and was instructed to go the emergency room for a blood transfusion because her hemoglobin was down to 7. However, she did not receive a transfusion since her labs had improved when they were rechecked in the ER. Today she has an elevated WBC of 12.6, improved from 20.9, a low hemoglobin of 8.9 improved from 8.3, and a low platelet count of 101,000 down from 112,000. Her CMP is normal other than an elevated creatinine of 1.09 improved from 1.46 and a low calcium  of 8.8 improved from 8.7. Her vitamin D  is pending and I will call her with the results. I will see her back in 3 months with CBC and CMP. The patient understands the plans discussed today and is in agreement with them. She knows to contact our office if she develops concerns prior to her next appointment.  I provided 16 minutes of face-to-face time during this encounter and > 50% was  spent counseling as documented under my assessment and plan.   Yolanda VEAR Cornish, MD   [facility] A DEPT OF [facility] [address] [place] [address] Dept: [phone] Dept Fax: [phone]   Orders Placed This Encounter  Procedures   Culture, Urine    Standing Status:   Future    Number of Occurrences:   1    Expiration  Date:   06/25/2025   Urinalysis, Complete w Microscopic    Standing Status:   Future    Number of Occurrences:   1    Expiration Date:   06/25/2025    CHIEF COMPLAINT:  CC: Essential thrombocythemia  Current Treatment: Surveillance  HISTORY OF PRESENT ILLNESS:  Yolanda Flowers is a 83 year old female with a history of essential thrombocythemia originally diagnosed in 73.  She was treated with hydroxyurea  500 mg 3 times daily.  We had to occasionally adjust doses because of leukopenia or anemia.  She also had a malignant melanoma of her left first toe resected in November 2015 with partial amputation of the toe and has done well.  She has had multiple other skin cancers removed over the years.  In March 2017, she was seen by Dr. Eloy for HSIL/VIN 3 of the right posterior labia minora and underwent vulvectomy.  Hydroxyurea  was decreased to 500 mg twice daily prior to surgery, as we did not feel we could safely hold hydroxyurea .  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  Bone density scan in March 2018 revealed osteopenia with a T-score of -1.6 in the femur, for which she is on calcium  and vitamin-D.  She has had chronic back pain, which is attributed to severe degenerative disease.  She had an MRI of the lumbar spine in March 2018, which revealed significant degenerative disc disease with mild spinal stenosis at L1 and L4, but moderate at L2-3.  Dr. Mindi has given her injections, as well at hydrocodone/APAP 5/325.  She has had intermittent leukocytosis since 2018, felt to be secondary to her essential thrombocythemia. She was evaluated in the emergency department in October 2019 and had a GI bleed. CT abdomen and pelvis revealed acute uncomplicated diverticulitis of the sigmoid colon with no other acute abnormalities.  A moderate to large hiatal hernia was seen. She saw Dr. Edwyna and underwent nuclear med  stress test in November, which did not reveal any evidence of inducible ischemia or other abnormality.  Left ventricular size and function was normal with an ejection fraction of 79%.  She underwent EGD and colonoscopy in December 2019, and no significant source of blood loss was identified. She was instructed to continue lansoprazole.  She had dilatation of an esophageal stricture and removal of a polyp. Pathology revealed a fragmented tubular adenoma.    In January 2020, she had worsening anemia, in addition to continued episodes of chest pain and worsening dyspnea with exertion at her routine follow-up. Due to the severe dyspnea and right lower extremity edema, we obtained a CT angiogram chest, which did not reveal any evidence of pulmonary embolism.  There was good opacification of the pulmonary arteries.  Diffuse coronary artery calcifications with cardiomegaly and moderate thoracic aortic atherosclerosis was seen.  There was a 3.6 mm noncalcified nodule in the anterior inferior right upper lobe. Right lower extremity venous Doppler ultrasound did not reveal any evidence of deep venous thrombosis. In February  2020, she had persistent anemia with a hemoglobin of 9.7. As she was symptomatic, hydroxyurea  500 mg was decreased to once daily.  Soluble transferrin was elevated, which was consistent with iron deficiency.  She saw Dr. Larene and he placed her on iron supplementation daily in the form of ferrous sulfate 65 mg. She was seen again in March and had improvement in her anemia with a hemoglobin of 10.2.  Her thrombocythemia remained controlled on hydroxyurea  500 mg daily. We encouraged her to have follow-up for her vulvar cancer, so followed up with Dr. Derry Bunker and her exam was normal.     She had a bilateral screening mammogram in February 2021, which revealed an area of calcifications in the right breast warranting further evaluation.  A diagnostic right mammogram was ordered, but this was  delayed, because she underwent open heart surgery with CABG x4 in March.  She had a monitor placed in the left chest wall the time of surgery.  She did not feel she can have mammogram due to the monitor in the left chest wall.  She then had 2 strokes following her surgery, from which she has recovered well, except for balance issues and left eye blindness.  She ambulates with a cane or walker depending on her circumstances.  She is on clopidogrel  75 mg daily, atorvastatin  80 mg daily, metoprolol  25 mg twice daily and pantoprazole  40 mg daily. She had a CT chest in July to follow-up on the pulmonary nodule which were stable. She finally underwent diagnostic right mammogram from July which revealed likely benign 6 mm group of calcifications involving the upper inner quadrant at middle depth. Bilateral diagnostic mammogram in February 2022 was recommended  At her visit in October 2021, she reported elevated transaminases and purpura, which was attributed to atorvastatin .  Dr. Revankar reduced the dose from 80 mg to 40 mg with normalization of the transaminases and no further purpura.  Her platelets were in low normal range, so hydroxyurea  was held and since her platelets have remained normal, hydroxyurea  was discontinued. She has had intermittent mild leukocytosis/neutrophilia since 2018.  She has occasionally had immature cells on her blood smear, but not blasts.    When I saw her in May 2022, she had worsening leukocytosis with persistent immature cells and worsening anemia, so I recommended a bone marrow biopsy, but this was never done as the patient was hospitalized with a stroke and discharged to rehab. She was seen again in August, at which time her white count had decreased from 24.2 to 19.9 with an ANC of 1473, with 11% lymphocytes, 10% monocytes and 2.4 % basophils and her hemoglobin had improved from 10.6 to 11.3. The platelet count remained normal, so continued observation was recommended.  Her blood  counts were stable in November.   She was admitted in January 2023 with a 3 day history of sensation of an object stuck in her throat following attempted swallowing of calcium  supplementation. She was eventually able to dislodge item after several hours.  She then developed stool that she described as motor oil for 2 days. She was evaluated in the emergency room and was found to have hemoglobin of 8.3, decreased from 11.4 in November 2022.   She underwent urgent EGD and was found to have a 3 cm mucosal tear within the distal 5 cm of her esophagus, felt to be most likely secondary to mucosal tear following calcium  carbonate impaction. CT neck and chest was performed to evaluate for possibility of  mediastinitis developing following possible full focus mucosal tear. This imaging did not reveal active inflammatory changes. 3 clips were placed closing the tear successfully and complete hemostasis was achieved.  She was placed on twice daily pantoprazole  with slow advancement of diet from clear liquid to house select over 2 days.  She had an acute drop in hemoglobin to 6.9 following further equilibration, so received 1 unit PRBC with appropriate response to 8.2 g/dL.Her hemoglobin later improved.  Review of her blood smear in March revealed occasional myelocytes, occasional nucleated RBC's, poikilocytosis, and 1 promyelocyte. We have been concerned about transformation to leukemia, with the changes on peripheral smear.  Her white blood cell count has been running between 16.5 and 24.1, her hemoglobin has been running between 10.2 and 11.7 and platelets running between 108,000 and 129,000 for the past year.  As her counts have been fairly stable, we have continued observation.  Bilateral diagnostic mammogram in January did not reveal any evidence of malignancy, screening mammogram in 1 year was recommended.  INTERVAL HISTORY:  Rogelio is here today for clinical assessment of her essential thrombocythemia. Patient  states that she feels well, but complains of severe dysuria and chronic back pain. I will prescribe Bactrim  DS BID for 10 days to treat for a possible UTI as we await the results of her urinalysis and urine culture. She is now taking oral vitamin D  once monthly rather than every 2 weeks. We will check the level today. She stated that she recently visited her PCP and was instructed to go the emergency room for a blood transfusion because her hemoglobin was down to 7. However, she did not receive a transfusion since her labs had improved when they were rechecked in the ER. She has an elevated WBC of 12.6 improved from 20.9, a low hemoglobin of 8.9 improved from 8.3, and a low platelet count of 101,000 down from 112,000. Her CMP is normal other than an elevated creatinine of 1.09 improved from 1.46 and a low calcium  of 8.8 improved from 8.7. Her vitamin D  is pending and I will call her with the results. I will see her back in 3 months with CBC and CMP.  She denies fever, chills, night sweats, or other signs of infection. She denies cardiorespiratory and gastrointestinal issues. She  denies pain. Her appetite is good and Her weight has increased 4 pounds over last 4 weeks.  REVIEW OF SYSTEMS:  Review of Systems  Constitutional:  Positive for fatigue. Negative for appetite change, chills, fever and unexpected weight change.  HENT:   Positive for hearing loss. Negative for lump/mass, mouth sores, nosebleeds, sore throat, tinnitus, trouble swallowing and voice change.   Eyes: Negative.  Negative for eye problems and icterus.  Respiratory: Negative.  Negative for chest tightness, cough, hemoptysis, shortness of breath and wheezing.   Cardiovascular: Negative.  Negative for chest pain, leg swelling and palpitations.  Gastrointestinal: Negative.  Negative for abdominal distention, abdominal pain, blood in stool, constipation, diarrhea, nausea, rectal pain and vomiting.  Endocrine: Negative.  Negative for hot  flashes.  Genitourinary:  Positive for dysuria (severe). Negative for bladder incontinence, difficulty urinating, dyspareunia, frequency, hematuria, menstrual problem, nocturia, pelvic pain, vaginal bleeding and vaginal discharge.        Recurrent UTI's. (She has tried prophylactic antibiotics, D-mannose and another medication)  Musculoskeletal:  Positive for back pain (chronic; 7/10) and gait problem (Due to previous CVA, using cane). Negative for arthralgias, flank pain, myalgias, neck pain and neck stiffness.  Skin:  Negative.  Negative for itching, rash and wound.  Neurological:  Positive for extremity weakness and gait problem (Due to previous CVA, using cane). Negative for dizziness, headaches, light-headedness, numbness, seizures and speech difficulty.  Hematological:  Negative for adenopathy. Bruises/bleeds easily.  Psychiatric/Behavioral: Negative.  Negative for confusion, decreased concentration, depression, sleep disturbance and suicidal ideas. The patient is not nervous/anxious.     VITALS:  Blood pressure 136/68, pulse 66, temperature 98.2 F (36.8 C), temperature source Oral, resp. rate 18, height 5' 5.6 (1.666 m), weight 130 lb 8 oz (59.2 kg), SpO2 99%.  Wt Readings from Last 3 Encounters:  07/08/24 129 lb (58.5 kg)  06/25/24 130 lb 8 oz (59.2 kg)  05/26/24 126 lb (57.2 kg)    Body mass index is 21.32 kg/m.  Performance status (ECOG): 1 - Symptomatic but completely ambulatory  PHYSICAL EXAM:  Physical Exam Vitals and nursing note reviewed.  Constitutional:      General: She is not in acute distress.    Appearance: Normal appearance. She is normal weight. She is not ill-appearing, toxic-appearing or diaphoretic.  HENT:     Head: Normocephalic and atraumatic.     Right Ear: Tympanic membrane, ear canal and external ear normal. Decreased hearing noted. There is no impacted cerumen.     Left Ear: Tympanic membrane, ear canal and external ear normal. Decreased hearing noted.  There is no impacted cerumen.     Nose: Nose normal. No congestion or rhinorrhea.     Mouth/Throat:     Mouth: Mucous membranes are moist.     Pharynx: Oropharynx is clear. No oropharyngeal exudate or posterior oropharyngeal erythema.  Eyes:     General: No scleral icterus.       Right eye: No discharge.        Left eye: No discharge.     Extraocular Movements: Extraocular movements intact.     Conjunctiva/sclera: Conjunctivae normal.     Pupils: Pupils are equal, round, and reactive to light.  Cardiovascular:     Rate and Rhythm: Normal rate and regular rhythm.     Pulses: Normal pulses.     Heart sounds: Murmur heard.     Systolic murmur is present with a grade of 1/6.     No friction rub. No gallop.  Pulmonary:     Effort: Pulmonary effort is normal. No respiratory distress.     Breath sounds: Normal breath sounds. No wheezing, rhonchi or rales.  Chest:     Comments: Bilateral breasts are without masses.  Abdominal:     General: Bowel sounds are normal. There is no distension.     Palpations: Abdomen is soft. There is no hepatomegaly, splenomegaly or mass.     Comments: Well healed sternal scar  Musculoskeletal:        General: Normal range of motion.     Cervical back: Normal range of motion and neck supple. No tenderness.     Right lower leg: No edema.     Left lower leg: No edema.  Lymphadenopathy:     Cervical: No cervical adenopathy.     Right cervical: No superficial, deep or posterior cervical adenopathy.    Left cervical: No superficial, deep or posterior cervical adenopathy.     Upper Body:     Right upper body: No supraclavicular, axillary or pectoral adenopathy.     Left upper body: No supraclavicular, axillary or pectoral adenopathy.     Lower Body: No right inguinal adenopathy. No left inguinal adenopathy.  Skin:    General: Skin is warm and dry.     Coloration: Skin is not jaundiced.     Findings: Bruising and ecchymosis present. No lesion or rash.      Comments: Multiple keratosis of her upper extremities and face that have been treated She has scattered bruises over both upper extremities   Neurological:     General: No focal deficit present.     Mental Status: She is alert and oriented to person, place, and time. Mental status is at baseline.     Cranial Nerves: No cranial nerve deficit.     Sensory: No sensory deficit.     Motor: No weakness.     Coordination: Coordination normal.     Gait: Gait normal.  Psychiatric:        Mood and Affect: Mood normal.        Behavior: Behavior normal.        Thought Content: Thought content normal.        Judgment: Judgment normal.    LABS:      Latest Ref Rng & Units 06/25/2024    2:48 PM 05/26/2024    3:22 PM 12/03/2023    3:47 PM  CBC  WBC 4.0 - 10.5 K/uL 12.6  20.9  10.3   Hemoglobin 12.0 - 15.0 g/dL 8.9  8.3  89.2   Hematocrit 36.0 - 46.0 % 28.4  25.7  34.5   Platelets 150 - 400 K/uL 101  112  117       Latest Ref Rng & Units 06/25/2024    2:48 PM 05/26/2024    3:22 PM 12/03/2023    3:47 PM  CMP  Glucose 70 - 99 mg/dL 886  861  890   BUN 8 - 23 mg/dL 16  26  14    Creatinine 0.44 - 1.00 mg/dL 8.90  8.53  9.02   Sodium 135 - 145 mmol/L 140  140  142   Potassium 3.5 - 5.1 mmol/L 3.5  4.1  4.3   Chloride 98 - 111 mmol/L 103  107  106   CO2 22 - 32 mmol/L 25  22  25    Calcium  8.9 - 10.3 mg/dL 8.8  8.7  9.0   Total Protein 6.5 - 8.1 g/dL 7.8  7.8  7.8   Total Bilirubin 0.0 - 1.2 mg/dL 0.5  0.4  0.3   Alkaline Phos 38 - 126 U/L 77  68  73   AST 15 - 41 U/L 19  25  24    ALT 0 - 44 U/L 9  9  13     No results found for: CEA1, CEA / No results found for: CEA1, CEA No results found for: PSA1 No results found for: CAN199 No results found for: CAN125  No results found for: STEPHANY CARLOTA BENSON MARKEL EARLA JOANNIE DOC, MSPIKE, SPEI Lab Results  Component Value Date   TIBC 283 05/26/2024   TIBC 351 11/13/2023   TIBC 484 (H) 10/17/2021    FERRITIN 264 05/26/2024   FERRITIN 43 11/13/2023   FERRITIN 16 10/17/2021   IRONPCTSAT 22 05/26/2024   IRONPCTSAT 26 11/13/2023   IRONPCTSAT 18 10/17/2021   Lab Results  Component Value Date   VITAMINB12 648 05/26/2024   Lab Results  Component Value Date   FOLATE >20.0 05/26/2024   No results found for: LDH  STUDIES:  CUP PACEART REMOTE DEVICE CHECK Result Date: 06/10/2024 ILR summary report received. Battery status OK. Normal device function. No new  symptom, tachy, brady, or pause episodes. No new AF episodes. Monthly summary reports and ROV/PRN ML, CVRS     HISTORY:   Past Medical History:  Diagnosis Date   Abdominal pain    Accelerating angina (HCC) 08/26/2019   Acute blood loss anemia 12/14/2019   Acute cardioembolic stroke (HCC) 11/30/2019   Allergic rhinitis 06/21/2014   Anxiety    Arthritis    Asthma    Atherosclerosis of arteries 08/24/2019   Bilateral edema of lower extremity    Body mass index (BMI) 25.0-25.9, adult 08/24/2019   CAD (coronary artery disease) 10/25/2019   Cellulitis of right leg    Chronic low back pain 01/27/2018   Combined hyperlipidemia 08/24/2019   Complication of anesthesia    difficulty waking   Congestive heart failure (CHF) (HCC)    Coronary artery disease    Coronary artery disease involving native heart without angina pectoris    Dyspnea 02/09/2019   Dyspnea on exertion    Dysuria 09/19/2021   Embolic stroke (HCC) 11/30/2019   Essential (primary) hypertension 02/20/2021   Essential thrombocythemia (HCC) 06/22/2014   Essential thrombocytosis (HCC) 06/22/2014   Gastrointestinal bleeding 06/23/2018   GERD without esophagitis 06/21/2014   History of melanoma excision    left greast toe 2015   History of squamous cell carcinoma excision    face--  multiple excisions   History of subdural hematoma    2008   HTN (hypertension) 06/21/2014   Hydronephrosis of right kidney 06/04/2024   Hypertension    Idiopathic scoliosis  and kyphoscoliosis 01/27/2018   IMO SNOMED Dx Update Oct 2024     Ischial bursitis, right 08/24/2019   Leucocytosis 12/14/2019   Leukocytosis 12/14/2019   Malignant melanoma of great toe (HCC) 08/24/2019   Melanoma in situ (HCC) 06/29/2014   Mild mitral stenosis 09/20/2022   Mixed hyperlipidemia 08/24/2019   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   Opioid dependence (HCC) 08/24/2019   OSA (obstructive sleep apnea)    intolerant   Pedal edema 08/24/2019   Pneumonia of both lungs due to infectious organism 12/20/2020   Recurrent urinary tract infection 02/27/2023   Renal insufficiency 08/24/2019   S/P CABG x 4 11/04/2019   Skin cancer 08/24/2019   Solitary pulmonary nodule on lung CT 02/09/2019   Squamous cell carcinoma, scalp/neck 08/24/2019   Stroke (HCC) 12/21/2020   Stroke due to embolism (HCC) 11/25/2019   Subdural hematoma (HCC) 12/20/2020   Swelling of limb 08/24/2019   Traumatic subdural hemorrhage with loss of consciousness of unspecified duration, initial encounter (HCC) 02/20/2021   VIN III (vulvar intraepithelial neoplasia III)    Vitamin B 12 deficiency 08/24/2019   Vitamin D  deficiency 08/24/2019   Wears dentures    UPPER AND LOWER PARTIAL   Wears glasses     Past Surgical History:  Procedure Laterality Date   ABDOMINAL HYSTERECTOMY  age 57   CARDIAC CATHETERIZATION  03/172021   CORONARY ARTERY BYPASS GRAFT N/A 11/04/2019   Procedure: CORONARY ARTERY BYPASS GRAFTING (CABG) x 4, with ENDOSCOPIC HARVESTING OF RIGHT GREATER SAPHENOUS VEIN.;  Surgeon: Lucas Dorise POUR, MD;  Location: [facility] OR;  Service: Open Heart Surgery;  Laterality: N/A;   CORONARY ULTRASOUND/IVUS N/A 11/03/2019   Procedure: Intravascular Ultrasound/IVUS;  Surgeon: Mady Bruckner, MD;  Location: [facility] INVASIVE CV LAB;  Service: Cardiovascular;  Laterality: N/A;   KNEE ARTHROSCOPY Left 2004   LEFT HEART CATH AND CORONARY ANGIOGRAPHY N/A 11/03/2019   Procedure: LEFT  HEART CATH AND CORONARY ANGIOGRAPHY;  Surgeon: Mady Bruckner, MD;  Location: [facility] INVASIVE CV LAB;  Service: Cardiovascular;  Laterality: N/A;   LOOP RECORDER INSERTION N/A 11/30/2019   Procedure: LOOP RECORDER INSERTION;  Surgeon: Kelsie Agent, MD;  Location: [facility] INVASIVE CV LAB;  Service: Cardiovascular;  Laterality: N/A;   MELANOMA EXCISION  2015   left great toe   REPAIR PERONEAL TENDONS ANKLE  2004   SUBDURAL HEMATOMA EVACUATION VIA CRANIOTOMY  2008      week later  post-op  [facility]   TEE WITHOUT CARDIOVERSION N/A 11/04/2019   Procedure: TRANSESOPHAGEAL ECHOCARDIOGRAM (TEE);  Surgeon: Lucas Dorise POUR, MD;  Location: [facility] OR;  Service: Open Heart Surgery;  Laterality: N/A;   VULVECTOMY N/A 10/24/2015   Procedure: WIDE LOCAL EXCISION OF THE VULVA ;  Surgeon: Maurilio Ship, MD;  Location: [facility];  Service: Gynecology;  Laterality: N/A;    Family History  Problem Relation Age of Onset   Clotting disorder Mother    Hypertension Mother    Heart disease Mother    Arthritis Mother    Stroke Father    Hypertension Father    Heart disease Father    Arthritis Father    Emphysema Father    Asthma Father    Kidney failure Father    Hypertension Sister    Heart disease Sister    Arthritis Sister    Hypertension Brother    Heart disease Brother    Arthritis Brother     Social History:  reports that she has never smoked. She has never used smokeless tobacco. She reports that she does not drink alcohol and does not use drugs.The patient is alone today.  Allergies:  Allergies  Allergen Reactions   Cephalexin Hives    Tolerated Ceftriaxone  01/10/24 admission   Other    Penicillin G    Ciprofloxacin  Other (See Comments)    Gi intolerance   Penicillins Rash    Childhood reaction Did it involve swelling of the face/tongue/throat, SOB, or low BP? No Did it involve sudden or severe rash/hives, skin peeling, or any reaction on the inside of your mouth or nose? No Did  you need to seek medical attention at a hospital or doctor's office? No When did it last happen?      50 years ago If all above answers are "NO", may proceed with cephalosporin use.     Current Medications: Current Outpatient Medications  Medication Sig Dispense Refill   ammonium lactate (LAC-HYDRIN) 12 % lotion Apply 1 Application topically as needed for dry skin.     aspirin  EC 81 MG tablet Take 1 tablet (81 mg total) by mouth daily. Swallow whole. 90 tablet 3   atorvastatin  (LIPITOR ) 10 MG tablet Take 10 mg by mouth daily.     conjugated estrogens (PREMARIN) vaginal cream Place 1 applicator vaginally every 3 (three) days.     fluconazole  (DIFLUCAN ) 150 MG tablet Take 150 mg by mouth every 3 (three) days.     fluticasone  (FLONASE ) 50 MCG/ACT nasal spray Place 2 sprays into both nostrils at bedtime.      meclizine (ANTIVERT) 12.5 MG tablet Take 12.5 mg by mouth 2 (two) times daily as needed for dizziness.     metoprolol  tartrate (LOPRESSOR ) 25 MG tablet TAKE 1 TABLET BY MOUTH TWICE A DAY 180 tablet 3   Multiple Vitamins-Minerals (PRESERVISION AREDS 2) CAPS Take 1 capsule by mouth in the morning and at bedtime.      omeprazole (PRILOSEC) 20 MG capsule Take  20 mg by mouth daily.     Sulfamethoxazole -Trimethoprim  (SMZ-TMP DS PO) Take 0.5 tablets by mouth daily. 400-80 mg     tamsulosin (FLOMAX) 0.4 MG CAPS capsule Take 0.4 mg by mouth daily.     Vitamin D , Ergocalciferol , (DRISDOL ) 1.25 MG (50000 UT) CAPS capsule Take 50,000 Units by mouth every 14 (fourteen) days.     No current facility-administered medications for this visit.   I,Siegfried Vieth H Nerida Boivin,acting as a scribe for Yolanda VEAR Cornish, MD.,have documented all relevant documentation on the behalf of Yolanda VEAR Cornish, MD,as directed by  Yolanda VEAR Cornish, MD while in the presence of Yolanda VEAR Cornish, MD.  I have reviewed this report as typed by the medical scribe, and it is complete and accurate.

## 2024-06-28 ENCOUNTER — Telehealth: Payer: Self-pay | Admitting: Oncology

## 2024-06-28 NOTE — Telephone Encounter (Signed)
 Patient has been scheduled for follow-up visit per 06/25/24 LOS.  LVM notifying pt of appt details, provided my direct number to pt if appt changes need to be made.

## 2024-06-29 LAB — URINE CULTURE: Culture: >=100000 — AB

## 2024-06-30 DIAGNOSIS — N289 Disorder of kidney and ureter, unspecified: Secondary | ICD-10-CM | POA: Diagnosis not present

## 2024-06-30 DIAGNOSIS — N12 Tubulo-interstitial nephritis, not specified as acute or chronic: Secondary | ICD-10-CM | POA: Diagnosis not present

## 2024-06-30 DIAGNOSIS — Z79899 Other long term (current) drug therapy: Secondary | ICD-10-CM | POA: Diagnosis not present

## 2024-06-30 DIAGNOSIS — N1330 Unspecified hydronephrosis: Secondary | ICD-10-CM | POA: Diagnosis not present

## 2024-07-08 ENCOUNTER — Ambulatory Visit: Attending: Cardiology | Admitting: Cardiology

## 2024-07-08 ENCOUNTER — Encounter: Payer: Self-pay | Admitting: Cardiology

## 2024-07-08 VITALS — BP 130/70 | HR 57 | Ht 65.6 in | Wt 129.0 lb

## 2024-07-08 DIAGNOSIS — E782 Mixed hyperlipidemia: Secondary | ICD-10-CM | POA: Diagnosis not present

## 2024-07-08 DIAGNOSIS — Z951 Presence of aortocoronary bypass graft: Secondary | ICD-10-CM | POA: Diagnosis not present

## 2024-07-08 DIAGNOSIS — I349 Nonrheumatic mitral valve disorder, unspecified: Secondary | ICD-10-CM

## 2024-07-08 DIAGNOSIS — I10 Essential (primary) hypertension: Secondary | ICD-10-CM | POA: Diagnosis not present

## 2024-07-08 DIAGNOSIS — I2510 Atherosclerotic heart disease of native coronary artery without angina pectoris: Secondary | ICD-10-CM

## 2024-07-08 DIAGNOSIS — I050 Rheumatic mitral stenosis: Secondary | ICD-10-CM

## 2024-07-08 NOTE — Progress Notes (Signed)
 Cardiology Office Note:    Date:  07/08/2024   ID:  Yolanda Flowers, DOB [birth year], MRN [id number]  PCP:  Gable Cambric, MD  Cardiologist:  Jennifer JONELLE Crape, MD   Referring MD: Gable Cambric, MD    ASSESSMENT:    1. Essential (primary) hypertension   2. Coronary artery disease involving native coronary artery of native heart without angina pectoris   3. Mild mitral stenosis   4. S/P CABG x 4   5. Mixed hyperlipidemia    PLAN:    In order of problems listed above:  Primary prevention stressed with the patient.  Importance of compliance with diet medication stressed and patient verbalized standing. Mild aortic stenosis: Medical management.  I discussed findings with her. Mild aortic stenosis: Medical management.  Report discussed with the patient at length.  Echo report from [facility] was reviewed Mixed dyslipidemia: On statins.  Lipids followed by primary care.  Diet emphasized. Urinary tract infections: This is managed by her urologist.  She is in close touch with them. Patient will be seen in follow-up appointment in 6 months or earlier if the patient has any concerns.    Medication Adjustments/Labs and Tests Ordered: Current medicines are reviewed at length with the patient today.  Concerns regarding medicines are outlined above.  Orders Placed This Encounter  Procedures   EKG 12-Lead   No orders of the defined types were placed in this encounter.    Chief Complaint  Patient presents with   Annual Exam     History of Present Illness:    Yolanda Flowers is a 83 y.o. female.  Patient has past medical history of mild aortic stenosis and mild mitral stenosis.  She has had history of stroke.  She has history of essential hypertension.  She denies any problems at this time.  She ambulates with a cane.  She mentions to me that she has had multiple admissions to the hospital with urinary tract infections.  I reviewed hospital records.  At the time of my evaluation, the  patient is alert awake oriented and in no distress.  Past Medical History:  Diagnosis Date   Abdominal pain    Accelerating angina (HCC) 08/26/2019   Acute blood loss anemia 12/14/2019   Acute cardioembolic stroke (HCC) 11/30/2019   Allergic rhinitis 06/21/2014   Anxiety    Arthritis    Asthma    Atherosclerosis of arteries 08/24/2019   Bilateral edema of lower extremity    Body mass index (BMI) 25.0-25.9, adult 08/24/2019   CAD (coronary artery disease) 10/25/2019   Cellulitis of right leg    Chronic low back pain 01/27/2018   Combined hyperlipidemia 08/24/2019   Complication of anesthesia    difficulty waking   Congestive heart failure (CHF) (HCC)    Coronary artery disease    Coronary artery disease involving native heart without angina pectoris    Dyspnea 02/09/2019   Dyspnea on exertion    Dysuria 09/19/2021   Embolic stroke (HCC) 11/30/2019   Essential (primary) hypertension 02/20/2021   Essential thrombocythemia (HCC) 06/22/2014   Essential thrombocytosis (HCC) 06/22/2014   Gastrointestinal bleeding 06/23/2018   GERD without esophagitis 06/21/2014   History of melanoma excision    left greast toe 2015   History of squamous cell carcinoma excision    face--  multiple excisions   History of subdural hematoma    2008   HTN (hypertension) 06/21/2014   Hydronephrosis of right kidney 06/04/2024   Hypertension  Idiopathic scoliosis and kyphoscoliosis 01/27/2018   IMO SNOMED Dx Update Oct 2024     Ischial bursitis, right 08/24/2019   Leucocytosis 12/14/2019   Leukocytosis 12/14/2019   Malignant melanoma of great toe (HCC) 08/24/2019   Melanoma in situ (HCC) 06/29/2014   Mild mitral stenosis 09/20/2022   Mixed hyperlipidemia 08/24/2019   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   Opioid dependence (HCC) 08/24/2019   OSA (obstructive sleep apnea)    intolerant   Pedal edema 08/24/2019   Pneumonia of both lungs due to infectious  organism 12/20/2020   Recurrent urinary tract infection 02/27/2023   Renal insufficiency 08/24/2019   S/P CABG x 4 11/04/2019   Skin cancer 08/24/2019   Solitary pulmonary nodule on lung CT 02/09/2019   Squamous cell carcinoma, scalp/neck 08/24/2019   Stroke (HCC) 12/21/2020   Stroke due to embolism (HCC) 11/25/2019   Subdural hematoma (HCC) 12/20/2020   Swelling of limb 08/24/2019   Traumatic subdural hemorrhage with loss of consciousness of unspecified duration, initial encounter (HCC) 02/20/2021   VIN III (vulvar intraepithelial neoplasia III)    Vitamin B 12 deficiency 08/24/2019   Vitamin D  deficiency 08/24/2019   Wears dentures    UPPER AND LOWER PARTIAL   Wears glasses     Past Surgical History:  Procedure Laterality Date   ABDOMINAL HYSTERECTOMY  age 29   CARDIAC CATHETERIZATION  03/172021   CORONARY ARTERY BYPASS GRAFT N/A 11/04/2019   Procedure: CORONARY ARTERY BYPASS GRAFTING (CABG) x 4, with ENDOSCOPIC HARVESTING OF RIGHT GREATER SAPHENOUS VEIN.;  Surgeon: Lucas Dorise POUR, MD;  Location: [facility] OR;  Service: Open Heart Surgery;  Laterality: N/A;   CORONARY ULTRASOUND/IVUS N/A 11/03/2019   Procedure: Intravascular Ultrasound/IVUS;  Surgeon: Mady Bruckner, MD;  Location: [facility] INVASIVE CV LAB;  Service: Cardiovascular;  Laterality: N/A;   KNEE ARTHROSCOPY Left 2004   LEFT HEART CATH AND CORONARY ANGIOGRAPHY N/A 11/03/2019   Procedure: LEFT HEART CATH AND CORONARY ANGIOGRAPHY;  Surgeon: Mady Bruckner, MD;  Location: [facility] INVAS[facility] CV [facility];  Service: Cardiovascular;  Laterality: N/A;   LOOP RECORDER INSERTION N/A 11/30/2019   Procedure: LOOP RECORDER INSERTION;  Surgeon: Kelsie Agent, MD;  Location: [facility];  Service: Cardiovascular;  Laterality: N/A;   MELANOMA EXCISION  2015   left great toe   REPAIR PERONEAL TENDONS ANKLE  2004   SUBDURAL HEMATOMA EVACUATION VIA CRANIOTOMY  2008      week later  post-op  [facility]   TEE WITHOUT CARDIOVERSION N/A 11/04/2019    Procedure: TRANSESOPHAGEAL ECHOCARDIOGRAM (TEE);  Surgeon: Lucas Dorise POUR, MD;  Location: [facility] OR;  Service: Open Heart Surgery;  Laterality: N/A;   VULVECTOMY N/A 10/24/2015   Procedure: WIDE LOCAL EXCISION OF THE VULVA ;  Surgeon: Maurilio Ship, MD;  Location: [facility];  Service: Gynecology;  Laterality: N/A;    Current Medications: Current Meds  Medication Sig   ammonium lactate (LAC-HYDRIN) 12 % lotion Apply 1 Application topically as needed for dry skin.   aspirin  EC 81 MG tablet Take 1 tablet (81 mg total) by mouth daily. Swallow whole.   atorvastatin  (LIPITOR ) 10 MG tablet Take 10 mg by mouth daily.   fluticasone  (FLONASE ) 50 MCG/ACT nasal spray Place 2 sprays into both nostrils at bedtime.      Allergies:   Cephalexin, Other, Penicillin g, Ciprofloxacin , and Penicillins   Social History   Socioeconomic History   Marital status: Married    Spouse name: Not on file  Number of children: Not on file   Years of education: Not on file   Highest education level: Not on file  Occupational History   Not on file  Tobacco Use   Smoking status: Never   Smokeless tobacco: Never  Vaping Use   Vaping status: Never Used  Substance and Sexual Activity   Alcohol use: No   Drug use: No   Sexual activity: Not Currently  Other Topics Concern   Not on file  Social History Narrative   Not on file   Social Drivers of Health   Financial Resource Strain: Not on file  Food Insecurity: Low Risk  (01/11/2024)   Received from [facility]   Hunger Vital Sign    Within the past 12 months, you worried that your food would run out before you got money to buy more: Never true    Within the past 12 months, the food you bought just didn't last and you didn't have money to get more. : Never true  Transportation Needs: No Transportation Needs (01/11/2024)   Received from [facility]    In the past 12 months, has lack of reliable transportation kept you from medical  appointments, meetings, work or from getting things needed for daily living? : No  Physical Activity: Not on file  Stress: Not on file  Social Connections: Not on file     Family History: The patient's family history includes Arthritis in her brother, father, mother, and sister; Asthma in her father; Clotting disorder in her mother; Emphysema in her father; Heart disease in her brother, father, mother, and sister; Hypertension in her brother, father, mother, and sister; Kidney failure in her father; Stroke in her father.  ROS:   Please see the history of present illness.    All other systems reviewed and are negative.  EKGs/Labs/Other Studies Reviewed:    The following studies were reviewed today: .SABRA   I discussed my findings with the patient at length   Recent Labs: 06/25/2024: ALT 9; BUN 16; Creatinine 1.09; Hemoglobin 8.9; Platelet Count 101; Potassium 3.5; Sodium 140  Recent Lipid Panel    Component Value Date/Time   CHOL 95 03/30/2021 0000   CHOL 105 09/26/2020 0908   TRIG 160 03/30/2021 0000   HDL 32 (A) 03/30/2021 0000   HDL 33 (L) 09/26/2020 0908   CHOLHDL 2.6 12/21/2020 0236   VLDL 21 12/21/2020 0236   LDLCALC 36 03/30/2021 0000   LDLCALC 41 09/26/2020 0908    Physical Exam:    VS:  Ht 5' 5.6 (1.666 m)   Wt 129 lb (58.5 kg)   BMI 21.08 kg/m     Wt Readings from Last 3 Encounters:  07/08/24 129 lb (58.5 kg)  06/25/24 130 lb 8 oz (59.2 kg)  05/26/24 126 lb (57.2 kg)     GEN: Patient is in no acute distress HEENT: Normal NECK: No JVD; No carotid bruits LYMPHATICS: No lymphadenopathy CARDIAC: Hear sounds regular, 2/6 systolic murmur at the apex. RESPIRATORY:  Clear to auscultation without rales, wheezing or rhonchi  ABDOMEN: Soft, non-tender, non-distended MUSCULOSKELETAL:  No edema; No deformity  SKIN: Warm and dry NEUROLOGIC:  Alert and oriented x 3 PSYCHIATRIC:  Normal affect   Signed, Jennifer JONELLE Crape, MD  07/08/2024 11:03 AM    [facility]

## 2024-07-08 NOTE — Patient Instructions (Signed)
Medication Instructions:  Your physician recommends that you continue on your current medications as directed. Please refer to the Current Medication list given to you today.  *If you need a refill on your cardiac medications before your next appointment, please call your pharmacy*   Lab Work: None Ordered If you have labs (blood work) drawn today and your tests are completely normal, you will receive your results only by: MyChart Message (if you have MyChart) OR A paper copy in the mail If you have any lab test that is abnormal or we need to change your treatment, we will call you to review the results.   Testing/Procedures: None Ordered   Follow-Up: At [facility], you and your health needs are our priority.  As part of our continuing mission to provide you with exceptional heart care, we have created designated Provider Care Teams.  These Care Teams include your primary Cardiologist (physician) and Advanced Practice Providers (APPs -  Physician Assistants and Nurse Practitioners) who all work together to provide you with the care you need, when you need it.  We recommend signing up for the patient portal called "MyChart".  Sign up information is provided on this After Visit Summary.  MyChart is used to connect with patients for Virtual Visits (Telemedicine).  Patients are able to view lab/test results, encounter notes, upcoming appointments, etc.  Non-urgent messages can be sent to your provider as well.   To learn more about what you can do with MyChart, go to [email or web address].com.    Your next appointment:   9 month(s)  The format for your next appointment:   In Person  Provider:   Rajan Revankar, MD    Other Instructions NA  

## 2024-07-09 DIAGNOSIS — D0462 Carcinoma in situ of skin of left upper limb, including shoulder: Secondary | ICD-10-CM | POA: Diagnosis not present

## 2024-07-09 DIAGNOSIS — L570 Actinic keratosis: Secondary | ICD-10-CM | POA: Diagnosis not present

## 2024-07-11 ENCOUNTER — Ambulatory Visit

## 2024-07-11 ENCOUNTER — Encounter

## 2024-07-13 ENCOUNTER — Ambulatory Visit

## 2024-07-13 DIAGNOSIS — N12 Tubulo-interstitial nephritis, not specified as acute or chronic: Secondary | ICD-10-CM | POA: Diagnosis not present

## 2024-07-13 DIAGNOSIS — N289 Disorder of kidney and ureter, unspecified: Secondary | ICD-10-CM | POA: Diagnosis not present

## 2024-07-13 DIAGNOSIS — I639 Cerebral infarction, unspecified: Secondary | ICD-10-CM | POA: Diagnosis not present

## 2024-07-13 DIAGNOSIS — N1330 Unspecified hydronephrosis: Secondary | ICD-10-CM | POA: Diagnosis not present

## 2024-07-14 LAB — CUP PACEART REMOTE DEVICE CHECK
Date Time Interrogation Session: 20251124230153
Implantable Pulse Generator Implant Date: 20210413

## 2024-07-16 NOTE — Progress Notes (Signed)
 Remote Loop Recorder Transmission

## 2024-07-19 LAB — VITAMIN D 1,25 DIHYDROXY
Vitamin D 1, 25 (OH)2 Total: 46 pg/mL
Vitamin D2 1, 25 (OH)2: 18 pg/mL
Vitamin D3 1, 25 (OH)2: 28 pg/mL

## 2024-07-23 ENCOUNTER — Ambulatory Visit: Payer: Self-pay | Admitting: Cardiology

## 2024-08-12 ENCOUNTER — Encounter

## 2024-08-13 ENCOUNTER — Ambulatory Visit: Attending: Cardiology

## 2024-08-13 DIAGNOSIS — I639 Cerebral infarction, unspecified: Secondary | ICD-10-CM

## 2024-08-13 LAB — CUP PACEART REMOTE DEVICE CHECK
Date Time Interrogation Session: 20251225230312
Implantable Pulse Generator Implant Date: 20210413

## 2024-08-16 ENCOUNTER — Ambulatory Visit: Payer: Self-pay | Admitting: Cardiology

## 2024-08-18 NOTE — Progress Notes (Signed)
 Remote Loop Recorder Transmission

## 2024-08-23 ENCOUNTER — Telehealth: Payer: Self-pay | Admitting: Oncology

## 2024-08-23 NOTE — Telephone Encounter (Signed)
 08/23/2024 Patient called to cancel appts.She is in [facility] will be going to a nursing facility when she is discharged.

## 2024-09-11 ENCOUNTER — Encounter

## 2024-09-13 ENCOUNTER — Ambulatory Visit

## 2024-09-22 ENCOUNTER — Encounter: Admitting: Podiatry

## 2024-09-22 ENCOUNTER — Inpatient Hospital Stay: Admitting: Oncology

## 2024-09-22 ENCOUNTER — Inpatient Hospital Stay

## 2024-09-22 NOTE — Progress Notes (Signed)
Patient did not show for scheduled appointment today.

## 2024-10-12 ENCOUNTER — Encounter

## 2024-10-14 ENCOUNTER — Ambulatory Visit

## 2024-11-12 ENCOUNTER — Encounter

## 2024-11-14 ENCOUNTER — Ambulatory Visit

## 2024-12-15 ENCOUNTER — Ambulatory Visit

## 2025-01-15 ENCOUNTER — Ambulatory Visit

## 2025-02-15 ENCOUNTER — Ambulatory Visit

## 2025-03-18 ENCOUNTER — Ambulatory Visit

## 2025-04-18 ENCOUNTER — Ambulatory Visit

## 2025-05-19 ENCOUNTER — Ambulatory Visit

## 2025-06-19 ENCOUNTER — Ambulatory Visit

## 2025-07-20 ENCOUNTER — Ambulatory Visit

## 2025-08-20 ENCOUNTER — Ambulatory Visit
